# Patient Record
Sex: Male | Born: 1935
Health system: Southern US, Community
[De-identification: ages and names within clinical notes are randomized; demographics above are authoritative.]

## PROBLEM LIST (undated history)

## (undated) DIAGNOSIS — J42 Unspecified chronic bronchitis: Secondary | ICD-10-CM

## (undated) DIAGNOSIS — E785 Hyperlipidemia, unspecified: Secondary | ICD-10-CM

## (undated) DIAGNOSIS — C801 Malignant (primary) neoplasm, unspecified: Secondary | ICD-10-CM

## (undated) DIAGNOSIS — E538 Deficiency of other specified B group vitamins: Secondary | ICD-10-CM

## (undated) DIAGNOSIS — J3089 Other allergic rhinitis: Secondary | ICD-10-CM

## (undated) DIAGNOSIS — I2510 Atherosclerotic heart disease of native coronary artery without angina pectoris: Secondary | ICD-10-CM

## (undated) DIAGNOSIS — I209 Angina pectoris, unspecified: Secondary | ICD-10-CM

## (undated) DIAGNOSIS — I38 Endocarditis, valve unspecified: Secondary | ICD-10-CM

## (undated) DIAGNOSIS — H9190 Unspecified hearing loss, unspecified ear: Secondary | ICD-10-CM

## (undated) DIAGNOSIS — M1990 Unspecified osteoarthritis, unspecified site: Secondary | ICD-10-CM

## (undated) DIAGNOSIS — F419 Anxiety disorder, unspecified: Secondary | ICD-10-CM

## (undated) DIAGNOSIS — D649 Anemia, unspecified: Secondary | ICD-10-CM

## (undated) DIAGNOSIS — N138 Other obstructive and reflux uropathy: Secondary | ICD-10-CM

## (undated) DIAGNOSIS — H269 Unspecified cataract: Secondary | ICD-10-CM

## (undated) DIAGNOSIS — H3530 Unspecified macular degeneration: Secondary | ICD-10-CM

## (undated) DIAGNOSIS — I509 Heart failure, unspecified: Secondary | ICD-10-CM

## (undated) DIAGNOSIS — J449 Chronic obstructive pulmonary disease, unspecified: Secondary | ICD-10-CM

## (undated) DIAGNOSIS — R0609 Other forms of dyspnea: Secondary | ICD-10-CM

## (undated) DIAGNOSIS — I4510 Unspecified right bundle-branch block: Secondary | ICD-10-CM

## (undated) DIAGNOSIS — R339 Retention of urine, unspecified: Secondary | ICD-10-CM

## (undated) DIAGNOSIS — Z8616 Personal history of COVID-19: Secondary | ICD-10-CM

## (undated) DIAGNOSIS — I714 Abdominal aortic aneurysm, without rupture, unspecified: Secondary | ICD-10-CM

## (undated) DIAGNOSIS — I10 Essential (primary) hypertension: Secondary | ICD-10-CM

## (undated) DIAGNOSIS — R972 Elevated prostate specific antigen [PSA]: Secondary | ICD-10-CM

## (undated) DIAGNOSIS — N401 Enlarged prostate with lower urinary tract symptoms: Secondary | ICD-10-CM

## (undated) DIAGNOSIS — I6529 Occlusion and stenosis of unspecified carotid artery: Secondary | ICD-10-CM

## (undated) DIAGNOSIS — C61 Malignant neoplasm of prostate: Secondary | ICD-10-CM

## (undated) DIAGNOSIS — Z87442 Personal history of urinary calculi: Secondary | ICD-10-CM

## (undated) HISTORY — PX: EYE SURGERY: SHX253

## (undated) HISTORY — DX: Anxiety disorder, unspecified: F41.9

## (undated) HISTORY — DX: Other obstructive and reflux uropathy: N13.8

## (undated) HISTORY — DX: Heart failure, unspecified: I50.9

## (undated) HISTORY — DX: Elevated prostate specific antigen (PSA): R97.20

## (undated) HISTORY — DX: Essential (primary) hypertension: I10

## (undated) HISTORY — DX: Chronic obstructive pulmonary disease, unspecified: J44.9

## (undated) HISTORY — PX: HERNIA REPAIR: SHX51

## (undated) HISTORY — DX: Unspecified chronic bronchitis: J42

## (undated) HISTORY — PX: OTHER SURGICAL HISTORY: SHX169

## (undated) HISTORY — DX: Retention of urine, unspecified: R33.9

## (undated) HISTORY — DX: Unspecified macular degeneration: H35.30

## (undated) HISTORY — DX: Hyperlipidemia, unspecified: E78.5

## (undated) HISTORY — PX: ABDOMINAL AORTIC ANEURYSM REPAIR: SUR1152

## (undated) HISTORY — DX: Benign prostatic hyperplasia with lower urinary tract symptoms: N40.1

---

## 2004-09-10 ENCOUNTER — Observation Stay: Payer: Self-pay | Admitting: Internal Medicine

## 2004-09-11 ENCOUNTER — Other Ambulatory Visit: Payer: Self-pay

## 2005-03-08 ENCOUNTER — Inpatient Hospital Stay: Payer: Self-pay | Admitting: Surgery

## 2005-12-25 ENCOUNTER — Ambulatory Visit: Payer: Self-pay | Admitting: Gastroenterology

## 2007-02-16 ENCOUNTER — Emergency Department: Payer: Self-pay | Admitting: Emergency Medicine

## 2007-02-16 ENCOUNTER — Emergency Department: Payer: Self-pay | Admitting: Internal Medicine

## 2009-05-21 ENCOUNTER — Emergency Department: Payer: Self-pay | Admitting: Emergency Medicine

## 2009-08-16 ENCOUNTER — Ambulatory Visit: Payer: Self-pay | Admitting: Vascular Surgery

## 2009-09-06 ENCOUNTER — Ambulatory Visit: Payer: Self-pay | Admitting: Vascular Surgery

## 2009-09-15 ENCOUNTER — Inpatient Hospital Stay: Payer: Self-pay | Admitting: Vascular Surgery

## 2010-06-18 HISTORY — PX: PROSTATE SURGERY: SHX751

## 2011-12-18 ENCOUNTER — Ambulatory Visit: Payer: Self-pay | Admitting: Urology

## 2011-12-18 LAB — CBC WITH DIFFERENTIAL/PLATELET
Basophil #: 0.1 10*3/uL (ref 0.0–0.1)
Basophil %: 1.8 %
Eosinophil #: 0.1 10*3/uL (ref 0.0–0.7)
Eosinophil %: 1 %
HCT: 41 % (ref 40.0–52.0)
HGB: 13.1 g/dL (ref 13.0–18.0)
Lymphocyte #: 1.2 10*3/uL (ref 1.0–3.6)
Lymphocyte %: 16.3 %
MCH: 29.7 pg (ref 26.0–34.0)
MCHC: 31.9 g/dL — ABNORMAL LOW (ref 32.0–36.0)
MCV: 93 fL (ref 80–100)
Monocyte #: 0.6 x10 3/mm (ref 0.2–1.0)
Monocyte %: 7.8 %
Neutrophil #: 5.3 10*3/uL (ref 1.4–6.5)
Neutrophil %: 73.1 %
Platelet: 202 10*3/uL (ref 150–440)
RBC: 4.42 10*6/uL (ref 4.40–5.90)
RDW: 14 % (ref 11.5–14.5)
WBC: 7.3 10*3/uL (ref 3.8–10.6)

## 2011-12-18 LAB — BASIC METABOLIC PANEL
Anion Gap: 4 — ABNORMAL LOW (ref 7–16)
BUN: 16 mg/dL (ref 7–18)
Calcium, Total: 8.6 mg/dL (ref 8.5–10.1)
Chloride: 104 mmol/L (ref 98–107)
Co2: 30 mmol/L (ref 21–32)
Creatinine: 1.12 mg/dL (ref 0.60–1.30)
EGFR (African American): >60
EGFR (Non-African Amer.): >60
Glucose: 88 mg/dL (ref 65–99)
Osmolality: 276 (ref 275–301)
Potassium: 4.3 mmol/L (ref 3.5–5.1)
Sodium: 138 mmol/L (ref 136–145)

## 2011-12-20 LAB — URINE CULTURE

## 2011-12-26 ENCOUNTER — Ambulatory Visit: Payer: Self-pay | Admitting: Urology

## 2012-10-27 DIAGNOSIS — N138 Other obstructive and reflux uropathy: Secondary | ICD-10-CM | POA: Insufficient documentation

## 2014-04-20 DIAGNOSIS — F411 Generalized anxiety disorder: Secondary | ICD-10-CM | POA: Insufficient documentation

## 2014-05-20 DIAGNOSIS — I714 Abdominal aortic aneurysm, without rupture, unspecified: Secondary | ICD-10-CM | POA: Insufficient documentation

## 2014-05-20 DIAGNOSIS — R0681 Apnea, not elsewhere classified: Secondary | ICD-10-CM | POA: Insufficient documentation

## 2014-05-20 DIAGNOSIS — I6529 Occlusion and stenosis of unspecified carotid artery: Secondary | ICD-10-CM | POA: Insufficient documentation

## 2014-08-08 ENCOUNTER — Emergency Department: Payer: Self-pay | Admitting: Family Medicine

## 2014-08-24 DIAGNOSIS — F419 Anxiety disorder, unspecified: Secondary | ICD-10-CM | POA: Insufficient documentation

## 2014-10-10 NOTE — Op Note (Signed)
PATIENT NAME:  [name], Ruben MR#:  [id number] DATE OF BIRTH:  [birth year]  DATE OF PROCEDURE:  12/26/2011  PREOPERATIVE DIAGNOSES:  1. Benign prostatic hypertrophy with bladder outlet obstruction. 2. Incomplete bladder emptying.   POSTOPERATIVE DIAGNOSES:    1. Benign prostatic hypertrophy with bladder outlet obstruction. 2. Incomplete bladder emptying.   PROCEDURE:  Photoselective vaporization of the prostate.   SURGEON:  Scott C. Stoioff, MD.   ASSISTANT: None.   ANESTHESIA:  General.   INDICATIONS: 81 year old male with a long history of benign prostatic hypertrophy and incomplete bladder emptying. He had been on both tamsulosin and finasteride for several years. Voiding pattern is moderate. Residual was noted to be slightly increased, approximately 500 milliliters. After a discussion of the treatment options, he has elected to proceed with outlet surgery.   DESCRIPTION OF PROCEDURE: The patient was taken to the Operating Room where a general anesthetic was administered. He was placed in the low lithotomy position and his external genitalia were prepped and draped sterilely. A 22 French continuous flow laser cystoscope was lubricated and passed under direct vision. The urethra was normal in caliber without stricture. Prostate showed touching the lateral lobes with no significant median lobe tissue present. Prostatic urethral length was approximately 4 cm. A KTP laser fiber was placed through the cystoscope. At a power of 90 watts, the slightly elevated bladder neck was vaporized. Attention was directed to the left lateral lobe which was vaporized from the bladder neck moving toward the verumontanum. Hemostasis was obtained with the coagulation setting. Power was subsequently increased to 180 watts. The right lateral lobe was vaporized in a similar fashion. There was some adenoma protruding  distal to the verumontanum which was not vaporized. The channel was opened at the completion of  the procedure. Total energy used was 267,159 joules. Laser time was 31 minutes and 57 seconds. Cystoscope was removed. A 20 French Foley catheter was place with return of light rosea effluent upon irrigation. A B and O suppository was placed per rectum. The patient was taken to PACU in stable condition. There were no complications. Estimated blood loss was minimal.    ____________________________ Ronda Fairly. Bernardo Heater, MD scs:ap D: 12/26/2011 21:10:10 ET T: 12/27/2011 09:39:27 ET JOB#: [id number]  cc: Nicki Reaper C. Bernardo Heater, MD, <Dictator> Abbie Sons MD ELECTRONICALLY SIGNED 12/31/2011 9:42

## 2015-01-28 ENCOUNTER — Other Ambulatory Visit: Payer: Self-pay | Admitting: Urology

## 2015-01-31 ENCOUNTER — Encounter: Payer: Self-pay | Admitting: *Deleted

## 2015-02-03 ENCOUNTER — Other Ambulatory Visit: Payer: Commercial Managed Care - HMO

## 2015-02-03 DIAGNOSIS — R972 Elevated prostate specific antigen [PSA]: Secondary | ICD-10-CM

## 2015-02-04 LAB — PSA: Prostate Specific Ag, Serum: 3.5 ng/mL (ref 0.0–4.0)

## 2015-02-08 ENCOUNTER — Ambulatory Visit (INDEPENDENT_AMBULATORY_CARE_PROVIDER_SITE_OTHER): Payer: Commercial Managed Care - HMO | Admitting: Urology

## 2015-02-08 ENCOUNTER — Encounter: Payer: Self-pay | Admitting: Urology

## 2015-02-08 VITALS — BP 131/72 | HR 47 | Ht 67.0 in | Wt 164.2 lb

## 2015-02-08 DIAGNOSIS — N528 Other male erectile dysfunction: Secondary | ICD-10-CM | POA: Diagnosis not present

## 2015-02-08 DIAGNOSIS — R972 Elevated prostate specific antigen [PSA]: Secondary | ICD-10-CM

## 2015-02-08 DIAGNOSIS — N529 Male erectile dysfunction, unspecified: Secondary | ICD-10-CM

## 2015-02-08 DIAGNOSIS — N401 Enlarged prostate with lower urinary tract symptoms: Secondary | ICD-10-CM

## 2015-02-08 DIAGNOSIS — N138 Other obstructive and reflux uropathy: Secondary | ICD-10-CM | POA: Insufficient documentation

## 2015-02-08 LAB — BLADDER SCAN AMB NON-IMAGING: Scan Result: 125

## 2015-02-08 MED ORDER — FINASTERIDE 5 MG PO TABS: 5.0000 mg | ORAL_TABLET | Freq: Every day | ORAL | Status: DC

## 2015-02-08 MED ORDER — TAMSULOSIN HCL 0.4 MG PO CAPS: 0.4000 mg | ORAL_CAPSULE | Freq: Every day | ORAL | Status: DC

## 2015-02-08 NOTE — Progress Notes (Signed)
02/08/2015 1:55 PM   Ruben Green April 02, 1934 [id number]  Referring provider: Tracie Harrier, MD [place], [place] [address]  Chief Complaint  Patient presents with  . Elevated PSA    6 month recheck    HPI: Ruben Green is a 81 year old [place] male with a history of elevated PSA and BPH with LUTS who presents today for 6 month recheck.   Elevated PSA Patient's PSA has ranged from 5-8. He is currently on finasteride and has been so for many years. He has no family history of prostate cancer. His DRE's have been benign for the exception of enlargement.  BPH with LUTS: His IPSS score today is 7, which is mild lower urinary tract symptomatology. He is mostly satisfied with his quality life due to his urinary symptoms. His PVR is 125  mL.  His previous IPSS score was 6/2.  His previous PVR is 13 mL.    His major complaint today erectile dysfunction.  He has had these symptoms for many years.  He denies any dysuria, hematuria or suprapubic pain.   He currently taking tamsulosin and finasteride.  His has had photo vaporization of the prostate on 12/26/2011 with Dr. Bernardo Heater.   He also denies any recent fevers, chills, nausea or vomiting.    He does not have a family history of PCa.      IPSS      02/08/15 1300       International Prostate Symptom Score   How often have you had the sensation of not emptying your bladder? Not at All     How often have you had to urinate less than every two hours? Not at All     How often have you found you stopped and started again several times when you urinated? Less than 1 in 5 times     How often have you found it difficult to postpone urination? Not at All     How often have you had a weak urinary stream? Not at All     How often have you had to strain to start urination? About half the time     How many times did you typically get up at night to urinate? 3 Times     Total IPSS Score 7     Quality of Life due  to urinary symptoms   If you were to spend the rest of your life with your urinary condition just the way it is now how would you feel about that? Mostly Satisfied        Score:  1-7 Mild 8-19 Moderate 20-35 Severe      PMH: Past Medical History  Diagnosis Date  . COPD (chronic obstructive pulmonary disease)   . HLD (hyperlipidemia)   . Anxiety   . Chronic bronchitis   . HTN (hypertension)   . BPH with obstruction/lower urinary tract symptoms   . Elevated PSA   . Incomplete bladder emptying     Surgical History: Past Surgical History  Procedure Laterality Date  . Prostate surgery  2012  . Right carotid endarterectomy    . Hernia repair      x 2  . Abdominal aortic aneurysm repair      Home Medications:    Medication List       This list is accurate as of: 02/08/15  1:55 PM.  Always use your most recent med list.  albuterol-ipratropium 18-103 MCG/ACT inhaler  Commonly known as:  COMBIVENT  Inhale 1-2 puffs into the lungs every 4 (four) hours.     amLODipine 10 MG tablet  Commonly known as:  NORVASC  Take 10 mg by mouth daily.     aspirin 81 MG tablet  Take 81 mg by mouth daily.     atorvastatin 40 MG tablet  Commonly known as:  LIPITOR  Take 40 mg by mouth daily.     finasteride 5 MG tablet  Commonly known as:  PROSCAR  TAKE ONE (1) TABLET EACH DAY     fluticasone 50 MCG/ACT nasal spray  Commonly known as:  FLONASE  Place into the nose.     furosemide 20 MG tablet  Commonly known as:  LASIX  Take by mouth.     lisinopril 40 MG tablet  Commonly known as:  PRINIVIL,ZESTRIL  Take 40 mg by mouth daily.     LORazepam 0.5 MG tablet  Commonly known as:  ATIVAN  Take 0.5 mg by mouth every 8 (eight) hours.     losartan 100 MG tablet  Commonly known as:  COZAAR  Take by mouth.     SYMBICORT 160-4.5 MCG/ACT inhaler  Generic drug:  budesonide-formoterol  Inhale into the lungs.     tamsulosin 0.4 MG Caps capsule  Commonly known  as:  FLOMAX  Take 0.4 mg by mouth daily.        Allergies: No Known Allergies  Family History: Family History  Problem Relation Age of Onset  . Prostate cancer Neg Hx   . Bladder Cancer Neg Hx     Social History:  reports that he has quit smoking. He does not have any smokeless tobacco history on file. He reports that he does not drink alcohol or use illicit drugs.  ROS: UROLOGY Frequent Urination?: No Hard to postpone urination?: No Burning/pain with urination?: No Get up at night to urinate?: Yes Leakage of urine?: No Urine stream starts and stops?: No Trouble starting stream?: No Do you have to strain to urinate?: Yes Blood in urine?: No Urinary tract infection?: No Sexually transmitted disease?: No Injury to kidneys or bladder?: No Painful intercourse?: No Weak stream?: No Erection problems?: No Penile pain?: No  Gastrointestinal Nausea?: No Vomiting?: No Indigestion/heartburn?: No Diarrhea?: No Constipation?: No  Constitutional Fever: No Night sweats?: No Weight loss?: No Fatigue?: No  Skin Skin rash/lesions?: No Itching?: No  Eyes Blurred vision?: No Double vision?: No  Ears/Nose/Throat Sore throat?: No Sinus problems?: No  Hematologic/Lymphatic Swollen glands?: No Easy bruising?: No  Cardiovascular Leg swelling?: No Chest pain?: No  Respiratory Cough?: No Shortness of breath?: No  Endocrine Excessive thirst?: No  Musculoskeletal Back pain?: No Joint pain?: No  Neurological Headaches?: No Dizziness?: No  Psychologic Depression?: No Anxiety?: No  Physical Exam: BP 131/72 mmHg  Pulse 47  Ht 5\' 7"  (1.702 m)  Wt 164 lb 3.2 oz (74.481 kg)  BMI 25.71 kg/m2  GU: Patient with uncircumcised phallus.  Urethral meatus is patent.  No penile discharge. No penile lesions or rashes. Scrotum without lesions, cysts, rashes and/or edema.  Testicles are located scrotally bilaterally. No masses are appreciated in the testicles. Left and  right epididymis are normal. Rectal: Patient with  normal sphincter tone. Perineum without scarring or rashes. No rectal masses are appreciated. Prostate is approximately 55 grams, no nodules are appreciated. Seminal vesicles are normal.   Laboratory Data: Lab Results  Component Value Date   WBC 7.3 12/18/2011  HGB 13.1 12/18/2011   HCT 41.0 12/18/2011   MCV 93 12/18/2011   PLT 202 12/18/2011    Lab Results  Component Value Date   CREATININE 1.12 12/18/2011    Lab Results  Component Value Date   PSA 3.5 02/03/2015   Previous PSA's:     5.67 ng/mL on 09/15/2013     6.9 ng/mL on 05/11/2014     7.3 ng/mL on 08/10/2014  Pertinent Imaging: Results for orders placed or performed in visit on 02/08/15  BLADDER SCAN AMB NON-IMAGING  Result Value Ref Range   Scan Result 125     Assessment & Plan:    1. History of elevated PSA:   Patient has undergone PSA screening for many years. It has been elevated on occasion. At this time, he would like to continue screening for we will increase the threshold for prostate biopsy to 10 ng/mL and when his PSA value reaches below 3 ng/mL we will discontinue PSA screening. Patient is agreeable to this plan.  2. BPH with LUTS:   Patient's IPSS score is 7/2.  His PVR 125 mL.  His DRE demonstrates enlargement, no nodules.  He will continue the tamsulosin and finasteride. His PVR is increasing but he is not bothered with his lower urinary tract symptoms at this time.   He will follow up in 6 months for a PSA, DRE, PVR and an IPSS.    - BLADDER SCAN AMB NON-IMAGING  3. Erectile dysfunction:   The end of the visit, patient stated that he has been experiencing erectile dysfunction for a long time. He was wondering if medications like Viagra or Cialis were safe.  I discussed the mechanism of action for these medications and the risk factors for the patient.   Gave him samples of Cialis 20 mg (#2).  We will reevaluate upon his return in 6 months with a SHIM  score.  Nursing note for RTC:      - IPSS score      - PVR      -SHIM score      -PSA (should have been drawn prior to appointment)       -EXAM      No Follow-up on file.  Zara Council, [facility] [address] [place], [place] [address] [phone]

## 2015-04-05 DIAGNOSIS — E538 Deficiency of other specified B group vitamins: Secondary | ICD-10-CM | POA: Insufficient documentation

## 2015-08-01 ENCOUNTER — Other Ambulatory Visit: Payer: Self-pay

## 2015-08-01 DIAGNOSIS — R972 Elevated prostate specific antigen [PSA]: Secondary | ICD-10-CM

## 2015-08-02 ENCOUNTER — Other Ambulatory Visit: Payer: PPO

## 2015-08-02 DIAGNOSIS — R972 Elevated prostate specific antigen [PSA]: Secondary | ICD-10-CM

## 2015-08-03 LAB — PSA: Prostate Specific Ag, Serum: 6.2 ng/mL — ABNORMAL HIGH (ref 0.0–4.0)

## 2015-08-05 DIAGNOSIS — I10 Essential (primary) hypertension: Secondary | ICD-10-CM | POA: Diagnosis not present

## 2015-08-05 DIAGNOSIS — I6529 Occlusion and stenosis of unspecified carotid artery: Secondary | ICD-10-CM | POA: Diagnosis not present

## 2015-08-05 DIAGNOSIS — I714 Abdominal aortic aneurysm, without rupture: Secondary | ICD-10-CM | POA: Diagnosis not present

## 2015-08-05 DIAGNOSIS — E785 Hyperlipidemia, unspecified: Secondary | ICD-10-CM | POA: Diagnosis not present

## 2015-08-05 DIAGNOSIS — I6523 Occlusion and stenosis of bilateral carotid arteries: Secondary | ICD-10-CM | POA: Diagnosis not present

## 2015-08-09 ENCOUNTER — Encounter: Payer: Self-pay | Admitting: Urology

## 2015-08-09 ENCOUNTER — Ambulatory Visit (INDEPENDENT_AMBULATORY_CARE_PROVIDER_SITE_OTHER): Payer: PPO | Admitting: Urology

## 2015-08-09 VITALS — BP 134/78 | HR 83 | Ht 62.0 in | Wt 171.2 lb

## 2015-08-09 DIAGNOSIS — N401 Enlarged prostate with lower urinary tract symptoms: Secondary | ICD-10-CM

## 2015-08-09 DIAGNOSIS — N529 Male erectile dysfunction, unspecified: Secondary | ICD-10-CM

## 2015-08-09 DIAGNOSIS — R972 Elevated prostate specific antigen [PSA]: Secondary | ICD-10-CM | POA: Diagnosis not present

## 2015-08-09 DIAGNOSIS — N528 Other male erectile dysfunction: Secondary | ICD-10-CM | POA: Diagnosis not present

## 2015-08-09 DIAGNOSIS — N138 Other obstructive and reflux uropathy: Secondary | ICD-10-CM

## 2015-08-09 LAB — BLADDER SCAN AMB NON-IMAGING: Scan Result: 250

## 2015-08-09 MED ORDER — FINASTERIDE 5 MG PO TABS: 5.0000 mg | ORAL_TABLET | Freq: Every day | ORAL | Status: DC

## 2015-08-09 NOTE — Progress Notes (Signed)
2:58 PM   Ruben Green August 29, 1933 [id number]  Referring provider: No referring provider defined for this encounter.  Chief Complaint  Patient presents with  . Benign Prostatic Hypertrophy    6 month follow up  . Elevated PSA    HPI: Mr. Handel is a 82 year old [place] male with a history of elevated PSA and BPH with LUTS who presents today for 6 month recheck.   Elevated PSA Patient's PSA has ranged from 5-8.  He has no family history of prostate cancer. His DRE's have been benign for the exception of enlargement.  He stopped his finasteride and his PSA rose from 3.5 to 6.2.    BPH with LUTS: His IPSS score today is 7, which is mild lower urinary tract symptomatology. He is mostly satisfied with his quality life due to his urinary symptoms. His PVR is 250 mL.  His previous IPSS score was 7/2.  His previous PVR is 125 mL.  His major complaint today erectile dysfunction.  He has had these symptoms for many years.  He denies any dysuria, hematuria or suprapubic pain.  He currently taking tamsulosin and restart the finasteride.  His has had photo vaporization of the prostate on 12/26/2011 with Dr. Bernardo Heater.  He also denies any recent fevers, chills, nausea or vomiting.  He does not have a family history of PCa.      IPSS      08/09/15 1300       International Prostate Symptom Score   How often have you had the sensation of not emptying your bladder? Not at All     How often have you had to urinate less than every two hours? Not at All     How often have you found you stopped and started again several times when you urinated? Not at All     How often have you found it difficult to postpone urination? Not at All     How often have you had a weak urinary stream? Not at All     How often have you had to strain to start urination? Not at All     How many times did you typically get up at night to urinate? 2 Times     Total IPSS Score 2     Quality of Life due to urinary  symptoms   If you were to spend the rest of your life with your urinary condition just the way it is now how would you feel about that? Mostly Satisfied        Score:  1-7 Mild 8-19 Moderate 20-35 Severe      PMH: Past Medical History  Diagnosis Date  . COPD (chronic obstructive pulmonary disease) ([place])   . HLD (hyperlipidemia)   . Anxiety   . Chronic bronchitis ([place])   . HTN (hypertension)   . BPH with obstruction/lower urinary tract symptoms   . Elevated PSA   . Incomplete bladder emptying     Surgical History: Past Surgical History  Procedure Laterality Date  . Prostate surgery  2012  . Right carotid endarterectomy    . Hernia repair      x 2  . Abdominal aortic aneurysm repair      Home Medications:    Medication List       This list is accurate as of: 08/09/15 11:59 PM.  Always use your most recent med list.  albuterol-ipratropium 18-103 MCG/ACT inhaler  Commonly known as:  COMBIVENT  Inhale 1-2 puffs into the lungs every 4 (four) hours.     amLODipine 10 MG tablet  Commonly known as:  NORVASC  Take 10 mg by mouth daily.     aspirin 81 MG tablet  Take 81 mg by mouth daily.     atorvastatin 40 MG tablet  Commonly known as:  LIPITOR  Take 40 mg by mouth daily.     finasteride 5 MG tablet  Commonly known as:  PROSCAR  Take 1 tablet (5 mg total) by mouth daily.     finasteride 5 MG tablet  Commonly known as:  PROSCAR  Take 1 tablet (5 mg total) by mouth daily.     fluticasone 50 MCG/ACT nasal spray  Commonly known as:  FLONASE  Place into the nose.     furosemide 20 MG tablet  Commonly known as:  LASIX  Take by mouth.     lisinopril 40 MG tablet  Commonly known as:  PRINIVIL,ZESTRIL  Take 40 mg by mouth daily.     LORazepam 0.5 MG tablet  Commonly known as:  ATIVAN  Take 0.5 mg by mouth every 8 (eight) hours.     losartan 100 MG tablet  Commonly known as:  COZAAR  Take by mouth.     SYMBICORT 160-4.5 MCG/ACT  inhaler  Generic drug:  budesonide-formoterol  Inhale into the lungs.     tamsulosin 0.4 MG Caps capsule  Commonly known as:  FLOMAX  Take 1 capsule (0.4 mg total) by mouth daily.        Allergies: No Known Allergies  Family History: Family History  Problem Relation Age of Onset  . Prostate cancer Neg Hx   . Bladder Cancer Neg Hx     Social History:  reports that he has quit smoking. He does not have any smokeless tobacco history on file. He reports that he does not drink alcohol or use illicit drugs.  ROS:  UROLOGY Frequent Urination?: No Hard to postpone urination?: No Burning/pain with urination?: No Get up at night to urinate?: No Leakage of urine?: No Urine stream starts and stops?: No Trouble starting stream?: No Do you have to strain to urinate?: No Blood in urine?: No Urinary tract infection?: No Sexually transmitted disease?: No Injury to kidneys or bladder?: No Painful intercourse?: No Weak stream?: No Erection problems?: No Penile pain?: No  Gastrointestinal Nausea?: No Vomiting?: No Indigestion/heartburn?: No Diarrhea?: No Constipation?: No  Constitutional Fever: No Night sweats?: No Weight loss?: No Fatigue?: No  Skin Skin rash/lesions?: No Itching?: No  Eyes Blurred vision?: No Double vision?: No  Ears/Nose/Throat Sore throat?: No Sinus problems?: No  Hematologic/Lymphatic Swollen glands?: No Easy bruising?: No  Cardiovascular Leg swelling?: No Chest pain?: No  Respiratory Cough?: No Shortness of breath?: No  Endocrine Excessive thirst?: No  Musculoskeletal Back pain?: No Joint pain?: No  Neurological Headaches?: No Dizziness?: No  Psychologic Depression?: No Anxiety?: No  Physical Exam: BP 134/78 mmHg  Pulse 83  Ht 5\' 2"  (1.575 m)  Wt 171 lb 3.2 oz (77.656 kg)  BMI 31.31 kg/m2  GU: Patient with uncircumcised phallus.  Urethral meatus is patent.  No penile discharge. No penile lesions or rashes. Scrotum  without lesions, cysts, rashes and/or edema.  Testicles are located scrotally bilaterally. No masses are appreciated in the testicles. Left and right epididymis are normal. Rectal: Patient with  normal sphincter tone. Perineum without scarring or rashes. No rectal  masses are appreciated. Prostate is approximately 55 grams, no nodules are appreciated. Seminal vesicles are normal.   Laboratory Data: Lab Results  Component Value Date   WBC 7.3 12/18/2011   HGB 13.1 12/18/2011   HCT 41.0 12/18/2011   MCV 93 12/18/2011   PLT 202 12/18/2011   Lab Results  Component Value Date   CREATININE 1.12 12/18/2011   Previous PSA's:     5.67 ng/mL on 09/15/2013     6.9 ng/mL on 05/11/2014     7.3 ng/mL on 08/10/2014     3.5 ng/mL on 02/03/2015   Pertinent Imaging: Results for orders placed or performed in visit on 08/09/15  BLADDER SCAN AMB NON-IMAGING  Result Value Ref Range   Scan Result 250     Assessment & Plan:    1. History of elevated PSA:   Patient has undergone PSA screening for many years. It has been elevated on occasion. At this time, he would like to continue screening so we will increase the threshold for prostate biopsy to 10 ng/mL.  Patient is agreeable to this plan.  2. BPH with LUTS:   Patient's IPSS score is 2/2.  His PVR 250 mL.  His DRE demonstrates enlargement, no nodules.  He will continue the tamsulosin and restart the finasteride. His PVR is increasing but he is not bothered with his lower urinary tract symptoms at this time.   He will follow up in 3 months for a PSA and PVR.    - BLADDER SCAN AMB NON-IMAGING  3. Erectile dysfunction:   The end of the visit, patient stated that he has been experiencing erectile dysfunction for a long time. He was wondering if medications like Viagra or Cialis were safe.  I discussed the mechanism of action for these medications and the risk factors for the patient.   Gave him samples of Cialis 20 mg (#2).  We will reevaluate upon his  return in 3 months with a SHIM score.  Return in about 3 months (around 11/06/2015) for PSA , SHIM score and PVR.  Zara Council, [facility] [address], [place], [place] [address] [phone]

## 2015-08-23 DIAGNOSIS — J42 Unspecified chronic bronchitis: Secondary | ICD-10-CM | POA: Diagnosis not present

## 2015-08-23 DIAGNOSIS — F419 Anxiety disorder, unspecified: Secondary | ICD-10-CM | POA: Diagnosis not present

## 2015-08-23 DIAGNOSIS — I10 Essential (primary) hypertension: Secondary | ICD-10-CM | POA: Diagnosis not present

## 2015-08-23 DIAGNOSIS — E782 Mixed hyperlipidemia: Secondary | ICD-10-CM | POA: Diagnosis not present

## 2015-08-23 DIAGNOSIS — Z125 Encounter for screening for malignant neoplasm of prostate: Secondary | ICD-10-CM | POA: Diagnosis not present

## 2015-08-23 DIAGNOSIS — E538 Deficiency of other specified B group vitamins: Secondary | ICD-10-CM | POA: Diagnosis not present

## 2015-08-30 DIAGNOSIS — E538 Deficiency of other specified B group vitamins: Secondary | ICD-10-CM | POA: Diagnosis not present

## 2015-08-30 DIAGNOSIS — R739 Hyperglycemia, unspecified: Secondary | ICD-10-CM | POA: Diagnosis not present

## 2015-08-30 DIAGNOSIS — J42 Unspecified chronic bronchitis: Secondary | ICD-10-CM | POA: Diagnosis not present

## 2015-08-30 DIAGNOSIS — F411 Generalized anxiety disorder: Secondary | ICD-10-CM | POA: Diagnosis not present

## 2015-08-30 DIAGNOSIS — I10 Essential (primary) hypertension: Secondary | ICD-10-CM | POA: Diagnosis not present

## 2015-08-30 DIAGNOSIS — R1031 Right lower quadrant pain: Secondary | ICD-10-CM | POA: Diagnosis not present

## 2015-08-30 DIAGNOSIS — F419 Anxiety disorder, unspecified: Secondary | ICD-10-CM | POA: Diagnosis not present

## 2015-08-30 DIAGNOSIS — Z0000 Encounter for general adult medical examination without abnormal findings: Secondary | ICD-10-CM | POA: Diagnosis not present

## 2015-11-08 ENCOUNTER — Ambulatory Visit (INDEPENDENT_AMBULATORY_CARE_PROVIDER_SITE_OTHER): Payer: PPO | Admitting: Urology

## 2015-11-08 ENCOUNTER — Encounter: Payer: Self-pay | Admitting: Urology

## 2015-11-08 VITALS — BP 155/77 | HR 91 | Ht 62.0 in | Wt 165.0 lb

## 2015-11-08 DIAGNOSIS — J449 Chronic obstructive pulmonary disease, unspecified: Secondary | ICD-10-CM | POA: Insufficient documentation

## 2015-11-08 DIAGNOSIS — N400 Enlarged prostate without lower urinary tract symptoms: Secondary | ICD-10-CM | POA: Insufficient documentation

## 2015-11-08 DIAGNOSIS — J441 Chronic obstructive pulmonary disease with (acute) exacerbation: Secondary | ICD-10-CM | POA: Insufficient documentation

## 2015-11-08 DIAGNOSIS — N401 Enlarged prostate with lower urinary tract symptoms: Secondary | ICD-10-CM | POA: Diagnosis not present

## 2015-11-08 DIAGNOSIS — I10 Essential (primary) hypertension: Secondary | ICD-10-CM | POA: Insufficient documentation

## 2015-11-08 DIAGNOSIS — N138 Other obstructive and reflux uropathy: Secondary | ICD-10-CM

## 2015-11-08 DIAGNOSIS — R972 Elevated prostate specific antigen [PSA]: Secondary | ICD-10-CM | POA: Diagnosis not present

## 2015-11-08 DIAGNOSIS — E782 Mixed hyperlipidemia: Secondary | ICD-10-CM | POA: Insufficient documentation

## 2015-11-08 LAB — BLADDER SCAN AMB NON-IMAGING

## 2015-11-08 NOTE — Progress Notes (Signed)
3:26 PM   Ruben Green Feb 17, 1934 BP:[id number]  Referring provider: No referring provider defined for this encounter.  Chief Complaint  Patient presents with  . Benign Prostatic Hypertrophy    40month    HPI: Mr. Zerger is a 82 year old [place] male with a history of elevated PSA, erectile dysfunction and BPH with LUTS who presents today for 6 month recheck.   Elevated PSA Patient's PSA has ranged from 5-8.  He has no family history of prostate cancer. His DRE's have been benign for the exception of enlargement.  He stopped his finasteride and his PSA rose from 3.5 to 6.2.  His most recent PSA was found to be 4.4 ng/mL on 11/08/2015.  Erectile dysfunction Patient refused to fill out a SH IM score sheet today.  He states that his wife is not interested in sexual activity and he is too old to concern himself with this issue.  BPH with LUTS: His IPSS score today is 6, which is mild lower urinary tract symptomatology. He is mostly satisfied with his quality life due to his urinary symptoms. His PVR is 131 mL.  His previous IPSS score was 7/2.  His previous PVR is 250 mL.   He denies any dysuria, hematuria or suprapubic pain.  He currently taking tamsulosin and restart the finasteride.  His has had photo vaporization of the prostate on 12/26/2011 with Dr. Bernardo Heater.  He also denies any recent fevers, chills, nausea or vomiting.  He does not have a family history of PCa.      IPSS      11/08/15 1400       International Prostate Symptom Score   How often have you had the sensation of not emptying your bladder? Not at All     How often have you had to urinate less than every two hours? Not at All     How often have you found you stopped and started again several times when you urinated? Not at All     How often have you found it difficult to postpone urination? Not at All     How often have you had a weak urinary stream? Not at All     How often have you had to strain to start  urination? Almost always     How many times did you typically get up at night to urinate? 1 Time     Total IPSS Score 6     Quality of Life due to urinary symptoms   If you were to spend the rest of your life with your urinary condition just the way it is now how would you feel about that? Mostly Satisfied        Score:  1-7 Mild 8-19 Moderate 20-35 Severe      PMH: Past Medical History  Diagnosis Date  . COPD (chronic obstructive pulmonary disease) ([place])   . HLD (hyperlipidemia)   . Anxiety   . Chronic bronchitis ([place])   . HTN (hypertension)   . BPH with obstruction/lower urinary tract symptoms   . Elevated PSA   . Incomplete bladder emptying     Surgical History: Past Surgical History  Procedure Laterality Date  . Prostate surgery  2012  . Right carotid endarterectomy    . Hernia repair      x 2  . Abdominal aortic aneurysm repair      Home Medications:    Medication List       This list  is accurate as of: 11/08/15  3:26 PM.  Always use your most recent med list.               albuterol-ipratropium 18-103 MCG/ACT inhaler  Commonly known as:  COMBIVENT  Inhale 1-2 puffs into the lungs every 4 (four) hours.     amLODipine 10 MG tablet  Commonly known as:  NORVASC  Take 10 mg by mouth daily.     aspirin 81 MG tablet  Take 81 mg by mouth daily.     atorvastatin 40 MG tablet  Commonly known as:  LIPITOR  Take 40 mg by mouth daily.     finasteride 5 MG tablet  Commonly known as:  PROSCAR  Take 1 tablet (5 mg total) by mouth daily.     fluticasone 50 MCG/ACT nasal spray  Commonly known as:  FLONASE  Place into the nose.     furosemide 20 MG tablet  Commonly known as:  LASIX  Take by mouth.     lisinopril 40 MG tablet  Commonly known as:  PRINIVIL,ZESTRIL  Take 40 mg by mouth daily.     LORazepam 0.5 MG tablet  Commonly known as:  ATIVAN  Take 0.5 mg by mouth every 8 (eight) hours.     losartan 100 MG tablet  Commonly known as:  COZAAR    Take by mouth.     SYMBICORT 160-4.5 MCG/ACT inhaler  Generic drug:  budesonide-formoterol  Inhale into the lungs.     tamsulosin 0.4 MG Caps capsule  Commonly known as:  FLOMAX  Take 1 capsule (0.4 mg total) by mouth daily.        Allergies: No Known Allergies  Family History: Family History  Problem Relation Age of Onset  . Prostate cancer Neg Hx   . Bladder Cancer Neg Hx     Social History:  reports that he has quit smoking. He does not have any smokeless tobacco history on file. He reports that he does not drink alcohol or use illicit drugs.  ROS:  UROLOGY Frequent Urination?: No Hard to postpone urination?: No Burning/pain with urination?: No Get up at night to urinate?: No Leakage of urine?: No Urine stream starts and stops?: No Trouble starting stream?: No Do you have to strain to urinate?: No Blood in urine?: No Urinary tract infection?: No Sexually transmitted disease?: No Injury to kidneys or bladder?: No Painful intercourse?: No Weak stream?: No Erection problems?: No Penile pain?: No  Gastrointestinal Nausea?: No Vomiting?: No Indigestion/heartburn?: No Diarrhea?: No Constipation?: No  Constitutional Fever: No Night sweats?: No Weight loss?: No Fatigue?: No  Skin Skin rash/lesions?: No Itching?: No  Eyes Blurred vision?: No Double vision?: No  Ears/Nose/Throat Sore throat?: No Sinus problems?: No  Hematologic/Lymphatic Swollen glands?: No Easy bruising?: No  Cardiovascular Leg swelling?: No Chest pain?: No  Respiratory Cough?: No Shortness of breath?: No  Endocrine Excessive thirst?: No  Musculoskeletal Back pain?: No Joint pain?: No  Neurological Headaches?: No Dizziness?: No  Psychologic Depression?: No Anxiety?: No  Physical Exam: BP 155/77 mmHg  Pulse 91  Ht 5\' 2"  (1.575 m)  Wt 165 lb (74.844 kg)  BMI 30.17 kg/m2  GU: Patient with uncircumcised phallus.  Urethral meatus is patent.  No penile  discharge. No penile lesions or rashes. Scrotum without lesions, cysts, rashes and/or edema.  Right hernia. Testicles are located scrotally bilaterally. No masses are appreciated in the testicles. Left and right epididymis are normal. Rectal: Patient with  normal sphincter tone.  Perineum without scarring or rashes. No rectal masses are appreciated. Prostate is approximately 55 grams, irregular,  no nodules are appreciated. Seminal vesicles are normal.   Laboratory Data: Lab Results  Component Value Date   WBC 7.3 12/18/2011   HGB 13.1 12/18/2011   HCT 41.0 12/18/2011   MCV 93 12/18/2011   PLT 202 12/18/2011   Lab Results  Component Value Date   CREATININE 1.12 12/18/2011   Previous PSA's:     5.67 ng/mL on 09/15/2013     6.9 ng/mL on 05/11/2014     7.3 ng/mL on 08/10/2014     3.5 ng/mL on 02/03/2015     6.2 ng/mL on 08/02/2015     4.4 ng/mL on 11/08/2015      Pertinent Imaging: Results for orders placed or performed in visit on 11/08/15  BLADDER SCAN AMB NON-IMAGING  Result Value Ref Range   Scan Result 144ml     Assessment & Plan:    1. Elevated PSA:   Patient has undergone PSA screening for many years. It has been elevated on occasion. At this time, he would like to continue screening so we will increase the threshold for prostate biopsy to 10 ng/mL.  Patient is agreeable to this plan.  2. BPH with LUTS:   Patient's IPSS score is 6/2.  His PVR 131 mL.  His DRE demonstrates enlargement, no nodules.  He will continue the tamsulosin and restart the finasteride. His PVR is increasing but he is not bothered with his lower urinary tract symptoms at this time.   He will follow up in 12 months for a PSA, IPSS and PVR.    - BLADDER SCAN AMB NON-IMAGING  3. Erectile dysfunction:   Patient has decided that he is not interested in pursuing treatment for his erectile dysfunction at this time.  Return in about 1 year (around 11/07/2016) for IPSS, PSA and exam.  Zara Council,  [facility]  [facility] [address] [place], [place] [address] [phone]

## 2015-11-09 ENCOUNTER — Telehealth: Payer: Self-pay

## 2015-11-09 LAB — PSA: Prostate Specific Ag, Serum: 4.4 ng/mL — ABNORMAL HIGH (ref 0.0–4.0)

## 2015-11-09 NOTE — Telephone Encounter (Signed)
LMOM

## 2015-11-09 NOTE — Telephone Encounter (Signed)
-----   Message from Nori Riis, PA-C sent at 11/09/2015  8:45 AM EDT ----- PSA has decreased.  We will see him in one year.

## 2015-11-09 NOTE — Telephone Encounter (Signed)
Spoke with pt wife and made aware of PSA results. Wife voiced understanding.

## 2015-11-16 DIAGNOSIS — K4091 Unilateral inguinal hernia, without obstruction or gangrene, recurrent: Secondary | ICD-10-CM | POA: Diagnosis not present

## 2015-11-22 ENCOUNTER — Inpatient Hospital Stay: Admission: RE | Admit: 2015-11-22 | Payer: Self-pay | Source: Ambulatory Visit

## 2015-11-30 ENCOUNTER — Encounter
Admission: RE | Admit: 2015-11-30 | Discharge: 2015-11-30 | Disposition: A | Discharge status: Home / Self Care | Payer: PPO | Source: Ambulatory Visit | Attending: Surgery | Admitting: Surgery

## 2015-11-30 DIAGNOSIS — R0602 Shortness of breath: Secondary | ICD-10-CM | POA: Diagnosis not present

## 2015-11-30 DIAGNOSIS — I10 Essential (primary) hypertension: Secondary | ICD-10-CM | POA: Diagnosis not present

## 2015-11-30 DIAGNOSIS — F419 Anxiety disorder, unspecified: Secondary | ICD-10-CM | POA: Diagnosis not present

## 2015-11-30 DIAGNOSIS — K4091 Unilateral inguinal hernia, without obstruction or gangrene, recurrent: Secondary | ICD-10-CM | POA: Diagnosis not present

## 2015-11-30 DIAGNOSIS — J449 Chronic obstructive pulmonary disease, unspecified: Secondary | ICD-10-CM | POA: Diagnosis not present

## 2015-11-30 DIAGNOSIS — Z87891 Personal history of nicotine dependence: Secondary | ICD-10-CM | POA: Diagnosis not present

## 2015-11-30 DIAGNOSIS — I739 Peripheral vascular disease, unspecified: Secondary | ICD-10-CM | POA: Diagnosis not present

## 2015-11-30 LAB — BASIC METABOLIC PANEL
Anion gap: 9 (ref 5–15)
BUN: 39 mg/dL — ABNORMAL HIGH (ref 6–20)
CO2: 20 mmol/L — ABNORMAL LOW (ref 22–32)
Calcium: 8.9 mg/dL (ref 8.9–10.3)
Chloride: 109 mmol/L (ref 101–111)
Creatinine, Ser: 0.89 mg/dL (ref 0.61–1.24)
GFR calc Af Amer: >60 mL/min (ref >60)
GFR calc non Af Amer: >60 mL/min (ref >60)
Glucose, Bld: 96 mg/dL (ref 65–99)
Potassium: 4.4 mmol/L (ref 3.5–5.1)
Sodium: 138 mmol/L (ref 135–145)

## 2015-11-30 NOTE — Patient Instructions (Signed)
Your procedure is scheduled on: Friday December 02, 2015 Su procedimiento est programado para: Report to Same Day Surgery - second floor medical mall entrance  To find out your arrival time please call [phone] between 1PM - 3PM on Thursday December 01, 2015 Para saber su hora de llegada por favor llame al [phone] entre la 1PM - 3PM el da:  Remember: Instructions that are not followed completely may result in serious medical risk, up to and including death, or upon the discretion of your surgeon and anesthesiologist your surgery may need to be rescheduled.  Recuerde: Las instrucciones que no se siguen completamente Heritage manager en un riesgo de salud grave, incluyendo hasta la [place] o a discrecin de su cirujano y Environmental health practitioner, su ciruga se puede posponer.   __X__ 1. Do not eat food or drink liquids after midnight. No gum chewing or hard candies.  No coma alimentos ni tome lquidos despus de la medianoche.  No mastique chicle ni caramelos  duros.     __X__ 2. No alcohol for 24 hours before or after surgery.    No tome alcohol durante las 24 horas antes ni despus de la [place].   __X_ 3. Bring all medications with you on the day of surgery if instructed.    Lleve todos los medicamentos con usted el da de su ciruga si se le ha indicado as.   ___X_ 4. Notify your doctor if there is any change in your medical condition (cold, fever,                             infections).    Informe a su mdico si hay algn cambio en su condicin mdica (resfriado, fiebre, infecciones).   Do not wear jewelry, make-up, hairpins, clips or nail polish.  No use joyas, maquillajes, pinzas/ganchos para el cabello ni esmalte de uas.  Do not wear lotions, powders, or perfumes. You may wear deodorant.  No use lociones, polvos o perfumes.  Puede usar desodorante.    Do not shave 48 hours prior to surgery. Men may shave face and neck.  No se afeite 48 horas antes de la [place].  Los hombres pueden  [employer] cara y el cuello.   Do not bring valuables to the hospital.   No lleve objetos [facility] is not responsible for any belongings or valuables.  [facility] no se hace responsable de ningn tipo de pertenencias u objetos de Geographical information systems officer.               Contacts, dentures or bridgework may not be worn into surgery.  Los lentes de [place], las dentaduras postizas o puentes no se pueden usar en la [place].  Leave your suitcase in the car. After surgery it may be brought to your room.  Deje su maleta en el auto.  Despus de la ciruga podr traerla a su habitacin.  For patients admitted to the hospital, discharge time is determined by your treatment team.  Para los pacientes que sean ingresados al hospital, el tiempo en el cual se le dar de alta es determinado por su                equipo de [place].   Patients discharged the day of surgery will not be allowed to drive home. A los pacientes que se les da de alta el mismo da de la ciruga no se les permitir conducir a Holiday representative.  Please read over the following fact sheets that you were given: Por favor [place] informacin que le dieron:     __X__ Take these medicines the morning of surgery with A SIP OF WATER:          [employer] medicinas la maana de la ciruga con UN SORBO DE AGUA:  1.Amlodipine  2.Lisinopril  3.   4.       5.  6.  ____ Fleet Enema (as directed)          Enema de Fleet (segn lo indicado)    ___X_ Use CHG Soap as directed          Utilice el jabn de CHG segn lo indicado  ___X_ Use inhalers on the day of surgery          Use los inhaladores el da de la ciruga  ____ Stop metformin 2 days prior to surgery          Deje de tomar el metformin 2 das antes de la ciruga    ____ Take 1/2 of usual insulin dose the night before surgery and none on the morning of surgery           Tome la mitad de la dosis habitual de insulina la noche antes de la [place] y no  tome nada en la maana de la             ciruga  ___X_ Stop Coumadin/Plavix/aspirin on (pt stopped a week ago)          Deje de tomar el Coumadin/Plavix/aspirina el da:  ___X_ Stop Anti-inflammatories today- use tylenol if needed          Deje de tomar antiinflamatorios el da:   ____ Stop supplements until after surgery            Deje de tomar suplementos hasta despus de la ciruga  ____ Bring C-Pap to the hospital          [facility]

## 2015-11-30 NOTE — Pre-Procedure Instructions (Signed)
EKG WITH KNOWN RBBB ON 2016 EKG

## 2015-12-02 ENCOUNTER — Ambulatory Visit: Payer: PPO | Admitting: Anesthesiology

## 2015-12-02 ENCOUNTER — Encounter
Admission: RE | Disposition: A | Discharge status: Home / Self Care | Payer: Self-pay | Source: Ambulatory Visit | Attending: Surgery

## 2015-12-02 ENCOUNTER — Ambulatory Visit
Admission: RE | Admit: 2015-12-02 | Discharge: 2015-12-02 | Disposition: A | Discharge status: Home / Self Care | Payer: PPO | Source: Ambulatory Visit | Attending: Surgery | Admitting: Surgery

## 2015-12-02 ENCOUNTER — Encounter: Payer: Self-pay | Admitting: Anesthesiology

## 2015-12-02 DIAGNOSIS — R0602 Shortness of breath: Secondary | ICD-10-CM | POA: Insufficient documentation

## 2015-12-02 DIAGNOSIS — J449 Chronic obstructive pulmonary disease, unspecified: Secondary | ICD-10-CM | POA: Insufficient documentation

## 2015-12-02 DIAGNOSIS — I10 Essential (primary) hypertension: Secondary | ICD-10-CM | POA: Insufficient documentation

## 2015-12-02 DIAGNOSIS — F419 Anxiety disorder, unspecified: Secondary | ICD-10-CM | POA: Insufficient documentation

## 2015-12-02 DIAGNOSIS — I739 Peripheral vascular disease, unspecified: Secondary | ICD-10-CM | POA: Insufficient documentation

## 2015-12-02 DIAGNOSIS — K4091 Unilateral inguinal hernia, without obstruction or gangrene, recurrent: Secondary | ICD-10-CM | POA: Insufficient documentation

## 2015-12-02 DIAGNOSIS — Z87891 Personal history of nicotine dependence: Secondary | ICD-10-CM | POA: Insufficient documentation

## 2015-12-02 HISTORY — PX: INGUINAL HERNIA REPAIR: SHX194

## 2015-12-02 SURGERY — REPAIR, HERNIA, INGUINAL, ADULT
Anesthesia: General | Laterality: Right | Wound class: Clean

## 2015-12-02 MED ORDER — ONDANSETRON HCL 4 MG/2ML IJ SOLN
INTRAMUSCULAR | Status: DC | PRN
  Administered 2015-12-02: 4 mg via INTRAVENOUS

## 2015-12-02 MED ORDER — BUPIVACAINE-EPINEPHRINE (PF) 0.5% -1:200000 IJ SOLN
INTRAMUSCULAR | Status: AC
  Filled 2015-12-02: qty 30

## 2015-12-02 MED ORDER — FAMOTIDINE 20 MG PO TABS
ORAL_TABLET | ORAL | Status: AC
  Administered 2015-12-02: 20 mg via ORAL
  Filled 2015-12-02: qty 1

## 2015-12-02 MED ORDER — CEFAZOLIN SODIUM-DEXTROSE 2-4 GM/100ML-% IV SOLN
2.0000 g | Freq: Once | INTRAVENOUS | Status: AC
  Administered 2015-12-02: 2 g via INTRAVENOUS

## 2015-12-02 MED ORDER — FENTANYL CITRATE (PF) 100 MCG/2ML IJ SOLN
25.0000 ug | INTRAMUSCULAR | Status: DC | PRN
  Administered 2015-12-02 (×4): 25 ug via INTRAVENOUS

## 2015-12-02 MED ORDER — LIDOCAINE HCL (CARDIAC) 20 MG/ML IV SOLN
INTRAVENOUS | Status: DC | PRN
  Administered 2015-12-02: 100 mg via INTRAVENOUS

## 2015-12-02 MED ORDER — LACTATED RINGERS IV SOLN
INTRAVENOUS | Status: DC
  Administered 2015-12-02 (×3): via INTRAVENOUS

## 2015-12-02 MED ORDER — PROPOFOL 10 MG/ML IV BOLUS
INTRAVENOUS | Status: DC | PRN
  Administered 2015-12-02: 120 mg via INTRAVENOUS

## 2015-12-02 MED ORDER — BUPIVACAINE-EPINEPHRINE (PF) 0.5% -1:200000 IJ SOLN
INTRAMUSCULAR | Status: DC | PRN
  Administered 2015-12-02: 22 mL

## 2015-12-02 MED ORDER — FAMOTIDINE 20 MG PO TABS
20.0000 mg | ORAL_TABLET | Freq: Once | ORAL | Status: AC
  Administered 2015-12-02: 20 mg via ORAL

## 2015-12-02 MED ORDER — HYDROCODONE-ACETAMINOPHEN 5-325 MG PO TABS
1.0000 | ORAL_TABLET | ORAL | Status: DC | PRN
Start: 2015-12-02 — End: 2015-12-02

## 2015-12-02 MED ORDER — FENTANYL CITRATE (PF) 100 MCG/2ML IJ SOLN
INTRAMUSCULAR | Status: AC
  Administered 2015-12-02: 25 ug via INTRAVENOUS
  Filled 2015-12-02: qty 2

## 2015-12-02 MED ORDER — HYDROCODONE-ACETAMINOPHEN 5-325 MG PO TABS: 1.0000 | ORAL_TABLET | ORAL | Status: DC | PRN

## 2015-12-02 MED ORDER — CEFAZOLIN SODIUM-DEXTROSE 2-4 GM/100ML-% IV SOLN
INTRAVENOUS | Status: AC
  Administered 2015-12-02: 2 g via INTRAVENOUS
  Filled 2015-12-02: qty 100

## 2015-12-02 MED ORDER — FENTANYL CITRATE (PF) 100 MCG/2ML IJ SOLN
INTRAMUSCULAR | Status: DC | PRN
  Administered 2015-12-02 (×2): 50 ug via INTRAVENOUS
  Administered 2015-12-02 (×2): 100 ug via INTRAVENOUS

## 2015-12-02 MED ORDER — EPHEDRINE SULFATE 50 MG/ML IJ SOLN
INTRAMUSCULAR | Status: DC | PRN
  Administered 2015-12-02: 10 mg via INTRAVENOUS

## 2015-12-02 MED ORDER — DEXAMETHASONE SODIUM PHOSPHATE 10 MG/ML IJ SOLN
INTRAMUSCULAR | Status: DC | PRN
  Administered 2015-12-02: 10 mg via INTRAVENOUS

## 2015-12-02 MED ORDER — ONDANSETRON HCL 4 MG/2ML IJ SOLN: 4.0000 mg | Freq: Once | INTRAMUSCULAR | Status: DC | PRN

## 2015-12-02 SURGICAL SUPPLY — 25 items
BLADE SURG 15 STRL LF DISP TIS (BLADE) ×1 IMPLANT
BLADE SURG 15 STRL SS (BLADE) ×2
CANISTER SUCT 1200ML W/VALVE (MISCELLANEOUS) ×3 IMPLANT
CHLORAPREP W/TINT 26ML (MISCELLANEOUS) ×3 IMPLANT
DRAIN PENROSE 5/8X18 LTX STRL (WOUND CARE) ×3 IMPLANT
DRAPE LAPAROTOMY 77X122 PED (DRAPES) ×3 IMPLANT
ELECT REM PT RETURN 9FT ADLT (ELECTROSURGICAL) ×3
ELECTRODE REM PT RTRN 9FT ADLT (ELECTROSURGICAL) ×1 IMPLANT
GLOVE BIO SURGEON STRL SZ7.5 (GLOVE) ×3 IMPLANT
GOWN STRL REUS W/ TWL LRG LVL3 (GOWN DISPOSABLE) ×3 IMPLANT
GOWN STRL REUS W/TWL LRG LVL3 (GOWN DISPOSABLE) ×6
KIT RM TURNOVER STRD PROC AR (KITS) ×3 IMPLANT
LABEL OR SOLS (LABEL) ×3 IMPLANT
LIQUID BAND (GAUZE/BANDAGES/DRESSINGS) ×3 IMPLANT
MESH SYNTHETIC 4X6 SOFT BARD (Mesh General) ×1 IMPLANT
MESH SYNTHETIC SOFT BARD 4X6 (Mesh General) ×2 IMPLANT
NEEDLE HYPO 25X1 1.5 SAFETY (NEEDLE) ×3 IMPLANT
NS IRRIG 500ML POUR BTL (IV SOLUTION) ×3 IMPLANT
PACK BASIN MINOR ARMC (MISCELLANEOUS) ×3 IMPLANT
SUT CHROMIC 4 0 RB 1X27 (SUTURE) ×3 IMPLANT
SUT MNCRL AB 4-0 PS2 18 (SUTURE) ×3 IMPLANT
SUT SURGILON 0 30 BLK (SUTURE) ×9 IMPLANT
SUT VIC AB 4-0 SH 27 (SUTURE) ×4
SUT VIC AB 4-0 SH 27XANBCTRL (SUTURE) ×2 IMPLANT
SYRINGE 10CC LL (SYRINGE) ×3 IMPLANT

## 2015-12-02 NOTE — Anesthesia Preprocedure Evaluation (Addendum)
Anesthesia Evaluation  Patient identified by MRN, date of birth, ID band Patient awake    Reviewed: Allergy & Precautions, NPO status , Patient's Chart, lab work & pertinent test results, reviewed documented beta blocker date and time   Airway Mallampati: II  TM Distance: >3 FB     Dental  (+) Chipped   Pulmonary shortness of breath and with exertion, COPD, former smoker,           Cardiovascular hypertension, Pt. on medications + Peripheral Vascular Disease       Neuro/Psych PSYCHIATRIC DISORDERS Anxiety    GI/Hepatic   Endo/Other    Renal/GU      Musculoskeletal   Abdominal   Peds  Hematology   Anesthesia Other Findings EKG OK, old RBBB.  Reproductive/Obstetrics                            Anesthesia Physical Anesthesia Plan  ASA: III  Anesthesia Plan: General   Post-op Pain Management:    Induction: Intravenous  Airway Management Planned: LMA  Additional Equipment:   Intra-op Plan:   Post-operative Plan:   Informed Consent: I have reviewed the patients History and Physical, chart, labs and discussed the procedure including the risks, benefits and alternatives for the proposed anesthesia with the patient or authorized representative who has indicated his/her understanding and acceptance.     Plan Discussed with: CRNA  Anesthesia Plan Comments:         Anesthesia Quick Evaluation

## 2015-12-02 NOTE — OR Nursing (Signed)
Dr. Tamala Julian visited.  [place] [facility]

## 2015-12-02 NOTE — Anesthesia Postprocedure Evaluation (Signed)
Anesthesia Post Note  Patient: Ruben Green  Procedure(s) Performed: Procedure(s) (LRB): HERNIA REPAIR INGUINAL ADULT (Right)  Patient location during evaluation: PACU Anesthesia Type: General Level of consciousness: awake and alert Pain management: pain level controlled Vital Signs Assessment: post-procedure vital signs reviewed and stable Respiratory status: spontaneous breathing and respiratory function stable Cardiovascular status: stable Anesthetic complications: no    Last Vitals:  Filed Vitals:   12/02/15 1825 12/02/15 1830  BP:  149/77  Pulse: 90 89  Temp: 37.2 C 36.7 C  Resp: 20 16    Last Pain:  Filed Vitals:   12/02/15 1836  PainSc: 2                  KEPHART,WILLIAM K

## 2015-12-02 NOTE — H&P (Signed)
  He reports no change in condition since the day of the office examination.  He has a history of recurrent right inguinal hernia with associated pain.  The right side was marked YES. I discussed the plan for right inguinal hernia repair.

## 2015-12-02 NOTE — Transfer of Care (Signed)
Immediate Anesthesia Transfer of Care Note  Patient: Ruben Green  Procedure(s) Performed: Procedure(s): HERNIA REPAIR INGUINAL ADULT (Right)  Patient Location: PACU  Anesthesia Type:General  Level of Consciousness: awake and sedated  Airway & Oxygen Therapy: Patient Spontanous Breathing and Patient connected to face mask oxygen  Post-op Assessment: Report given to RN and Post -op Vital signs reviewed and stable  Post vital signs: Reviewed and stable  Last Vitals:  Filed Vitals:   12/02/15 1347 12/02/15 1743  BP: 126/71 167/82  Pulse: 80 102  Temp: 36.5 C 36.9 C  Resp: 16 14    Last Pain:  Filed Vitals:   12/02/15 1746  PainSc: Asleep         Complications: No apparent anesthesia complications

## 2015-12-02 NOTE — Op Note (Signed)
OPERATIVE REPORT  PREOPERATIVE DIAGNOSIS: Recurrent right inguinal hernia  POSTOPERATIVE DIAGNOSIS: Recurrent right  inguinal hernia  PROCEDURE: Recurrent right inguinal hernia repair  ANESTHESIA:  General  SURGEON:  Rochel Brome M.D.  INDICATIONS: He has had recent pain in the right groin and a right inguinal hernia was demonstrated on physical exam and repair was recommended for definitive treatment.  With the patient on the operating table in the supine position the right lower quadrant was prepared with clippers and with ChloraPrep and draped in a sterile manner. A transversely oriented suprapubic incision was made and carried down through subcutaneous tissues. Electrocautery was used for hemostasis. The Scarpa's fascia was incised. The external oblique aponeurosis was incised along the course of its fibers to open the external ring and expose the inguinal cord structures. The cord structures were mobilized. A Penrose drain was passed around the cord structures for traction. There was scar tissue appreciated from previous surgery. There was a finding of a direct inguinal hernia. The hernia sac was dissected free from surrounding structures. The attenuated transversalis fascia was incised circumferentially and removed and was not sent for pathology. The sac some 6 cm in length was opened its continuity with the peritoneal cavity was demonstrated. A small amount of bowel was pushed back into the abdominal cavity and ligation of the sac was done with a 0 Surgilon suture ligature and the sac was amputated and was not submitted for pathology. The repair was carried out with 0 Surgilon sutures suturing the conjoined tendon to the shelving edge of the inguinal ligament incorporating transversalis fascia into the repair. The last stitch led to satisfactory narrowing of the internal ring. There was also some weakness of the fascia superior to the internal ring which was imbricated with interrupted 0  Surgilon sutures. Bard soft mesh was cut to create an oval shape and was placed over the repair. This was sutured to the repair with interrupted 0 Surgilon sutures and also sutured medially to the deep fascia and on both sides of the internal ring. Next after seeing hemostasis was intact the arch structures were replaced along the floor of the inguinal canal. The cut edges of the external oblique aponeurosis were closed with a running 4-0 Vicryl suture to re-create the external ring. The deep fascia superior and lateral to the repair site was infiltrated with half percent Sensorcaine with epinephrine. Subcutaneous tissues were also infiltrated. The Scarpa's fascia was closed with interrupted 4-0 Vicryl sutures. The skin was closed with running 4-0 Monocryl subcuticular suture and LiquiBand. The testicle remained in the scrotum  The patient appeared to be in satisfactory condition and was prepared for transfer to the recovery room.  Rochel Brome M.D.

## 2015-12-02 NOTE — Anesthesia Procedure Notes (Signed)
Procedure Name: LMA Insertion Date/Time: 12/02/2015 4:10 PM Performed by: Nelda Marseille Pre-anesthesia Checklist: Patient identified, Patient being monitored, Timeout performed, Emergency Drugs available and Suction available Patient Re-evaluated:Patient Re-evaluated prior to inductionOxygen Delivery Method: Circle system utilized Preoxygenation: Pre-oxygenation with 100% oxygen Intubation Type: IV induction Ventilation: Mask ventilation without difficulty LMA: LMA inserted LMA Size: 4.5 Tube type: Oral Number of attempts: 1 Placement Confirmation: positive ETCO2 and breath sounds checked- equal and bilateral Tube secured with: Tape Dental Injury: Teeth and Oropharynx as per pre-operative assessment

## 2015-12-02 NOTE — Discharge Instructions (Signed)
Take Tylenol or Norco if needed for pain.  May resume aspirin on Sunday.  May shower.  Avoid straining and heavy lifting.   AMBULATORY SURGERY  DISCHARGE INSTRUCTIONS   1) The drugs that you were given will stay in your system until tomorrow so for the next 24 hours you should not:  A) Drive an automobile B) Make any legal decisions C) Drink any alcoholic beverage   2) You may resume regular meals tomorrow.  Today it is better to start with liquids and gradually work up to solid foods.  You may eat anything you prefer, but it is better to start with liquids, then soup and crackers, and gradually work up to solid foods.   3) Please notify your doctor immediately if you have any unusual bleeding, trouble breathing, redness and pain at the surgery site, drainage, fever, or pain not relieved by medication.    4) Additional Instructions:        Please contact your physician with any problems or Same Day Surgery at [phone], Monday through Friday 6 am to 4 pm, or [place] [facility] number at [phone].

## 2015-12-05 ENCOUNTER — Encounter: Payer: Self-pay | Admitting: Surgery

## 2015-12-19 DIAGNOSIS — J42 Unspecified chronic bronchitis: Secondary | ICD-10-CM | POA: Diagnosis not present

## 2015-12-19 DIAGNOSIS — F411 Generalized anxiety disorder: Secondary | ICD-10-CM | POA: Diagnosis not present

## 2015-12-19 DIAGNOSIS — F419 Anxiety disorder, unspecified: Secondary | ICD-10-CM | POA: Diagnosis not present

## 2015-12-19 DIAGNOSIS — Z0000 Encounter for general adult medical examination without abnormal findings: Secondary | ICD-10-CM | POA: Diagnosis not present

## 2015-12-19 DIAGNOSIS — I10 Essential (primary) hypertension: Secondary | ICD-10-CM | POA: Diagnosis not present

## 2015-12-19 DIAGNOSIS — R1031 Right lower quadrant pain: Secondary | ICD-10-CM | POA: Diagnosis not present

## 2015-12-19 DIAGNOSIS — E538 Deficiency of other specified B group vitamins: Secondary | ICD-10-CM | POA: Diagnosis not present

## 2015-12-19 DIAGNOSIS — R739 Hyperglycemia, unspecified: Secondary | ICD-10-CM | POA: Diagnosis not present

## 2015-12-29 DIAGNOSIS — H359 Unspecified retinal disorder: Secondary | ICD-10-CM | POA: Diagnosis not present

## 2016-01-05 ENCOUNTER — Other Ambulatory Visit: Payer: Self-pay | Admitting: Internal Medicine

## 2016-01-05 DIAGNOSIS — J440 Chronic obstructive pulmonary disease with acute lower respiratory infection: Secondary | ICD-10-CM | POA: Diagnosis not present

## 2016-01-05 DIAGNOSIS — F411 Generalized anxiety disorder: Secondary | ICD-10-CM

## 2016-01-05 DIAGNOSIS — E782 Mixed hyperlipidemia: Secondary | ICD-10-CM

## 2016-01-05 DIAGNOSIS — R410 Disorientation, unspecified: Secondary | ICD-10-CM

## 2016-01-05 DIAGNOSIS — I10 Essential (primary) hypertension: Secondary | ICD-10-CM

## 2016-01-05 DIAGNOSIS — J449 Chronic obstructive pulmonary disease, unspecified: Secondary | ICD-10-CM | POA: Diagnosis not present

## 2016-01-05 DIAGNOSIS — J209 Acute bronchitis, unspecified: Secondary | ICD-10-CM

## 2016-01-05 DIAGNOSIS — E538 Deficiency of other specified B group vitamins: Secondary | ICD-10-CM | POA: Diagnosis not present

## 2016-01-13 DIAGNOSIS — H353211 Exudative age-related macular degeneration, right eye, with active choroidal neovascularization: Secondary | ICD-10-CM | POA: Diagnosis not present

## 2016-01-17 ENCOUNTER — Ambulatory Visit
Admission: RE | Admit: 2016-01-17 | Discharge: 2016-01-17 | Disposition: A | Discharge status: Home / Self Care | Payer: PPO | Source: Ambulatory Visit | Attending: Internal Medicine | Admitting: Internal Medicine

## 2016-01-17 DIAGNOSIS — I10 Essential (primary) hypertension: Secondary | ICD-10-CM

## 2016-01-17 DIAGNOSIS — E782 Mixed hyperlipidemia: Secondary | ICD-10-CM | POA: Diagnosis not present

## 2016-01-17 DIAGNOSIS — J440 Chronic obstructive pulmonary disease with acute lower respiratory infection: Secondary | ICD-10-CM | POA: Diagnosis not present

## 2016-01-17 DIAGNOSIS — F411 Generalized anxiety disorder: Secondary | ICD-10-CM

## 2016-01-17 DIAGNOSIS — J209 Acute bronchitis, unspecified: Secondary | ICD-10-CM

## 2016-01-17 DIAGNOSIS — R410 Disorientation, unspecified: Secondary | ICD-10-CM

## 2016-02-24 DIAGNOSIS — H353290 Exudative age-related macular degeneration, unspecified eye, stage unspecified: Secondary | ICD-10-CM | POA: Diagnosis not present

## 2016-03-20 DIAGNOSIS — I10 Essential (primary) hypertension: Secondary | ICD-10-CM | POA: Diagnosis not present

## 2016-03-20 DIAGNOSIS — J40 Bronchitis, not specified as acute or chronic: Secondary | ICD-10-CM | POA: Diagnosis not present

## 2016-03-20 DIAGNOSIS — J42 Unspecified chronic bronchitis: Secondary | ICD-10-CM | POA: Diagnosis not present

## 2016-04-03 DIAGNOSIS — J209 Acute bronchitis, unspecified: Secondary | ICD-10-CM | POA: Diagnosis not present

## 2016-04-03 DIAGNOSIS — R410 Disorientation, unspecified: Secondary | ICD-10-CM | POA: Diagnosis not present

## 2016-04-03 DIAGNOSIS — E782 Mixed hyperlipidemia: Secondary | ICD-10-CM | POA: Diagnosis not present

## 2016-04-03 DIAGNOSIS — J440 Chronic obstructive pulmonary disease with acute lower respiratory infection: Secondary | ICD-10-CM | POA: Diagnosis not present

## 2016-04-03 DIAGNOSIS — I10 Essential (primary) hypertension: Secondary | ICD-10-CM | POA: Diagnosis not present

## 2016-04-03 DIAGNOSIS — F411 Generalized anxiety disorder: Secondary | ICD-10-CM | POA: Diagnosis not present

## 2016-04-11 DIAGNOSIS — Z0000 Encounter for general adult medical examination without abnormal findings: Secondary | ICD-10-CM | POA: Diagnosis not present

## 2016-04-11 DIAGNOSIS — E782 Mixed hyperlipidemia: Secondary | ICD-10-CM | POA: Diagnosis not present

## 2016-04-11 DIAGNOSIS — I10 Essential (primary) hypertension: Secondary | ICD-10-CM | POA: Diagnosis not present

## 2016-04-11 DIAGNOSIS — F411 Generalized anxiety disorder: Secondary | ICD-10-CM | POA: Diagnosis not present

## 2016-04-11 DIAGNOSIS — F419 Anxiety disorder, unspecified: Secondary | ICD-10-CM | POA: Diagnosis not present

## 2016-04-11 DIAGNOSIS — E538 Deficiency of other specified B group vitamins: Secondary | ICD-10-CM | POA: Diagnosis not present

## 2016-04-11 DIAGNOSIS — Z23 Encounter for immunization: Secondary | ICD-10-CM | POA: Diagnosis not present

## 2016-04-11 DIAGNOSIS — J449 Chronic obstructive pulmonary disease, unspecified: Secondary | ICD-10-CM | POA: Diagnosis not present

## 2016-04-27 DIAGNOSIS — H353211 Exudative age-related macular degeneration, right eye, with active choroidal neovascularization: Secondary | ICD-10-CM | POA: Diagnosis not present

## 2016-05-24 DIAGNOSIS — I10 Essential (primary) hypertension: Secondary | ICD-10-CM | POA: Diagnosis not present

## 2016-05-24 DIAGNOSIS — I6523 Occlusion and stenosis of bilateral carotid arteries: Secondary | ICD-10-CM | POA: Diagnosis not present

## 2016-05-24 DIAGNOSIS — I2510 Atherosclerotic heart disease of native coronary artery without angina pectoris: Secondary | ICD-10-CM | POA: Diagnosis not present

## 2016-05-24 DIAGNOSIS — I714 Abdominal aortic aneurysm, without rupture: Secondary | ICD-10-CM | POA: Diagnosis not present

## 2016-05-30 DIAGNOSIS — I2510 Atherosclerotic heart disease of native coronary artery without angina pectoris: Secondary | ICD-10-CM | POA: Insufficient documentation

## 2016-06-09 DIAGNOSIS — J441 Chronic obstructive pulmonary disease with (acute) exacerbation: Secondary | ICD-10-CM | POA: Diagnosis not present

## 2016-07-13 DIAGNOSIS — H353211 Exudative age-related macular degeneration, right eye, with active choroidal neovascularization: Secondary | ICD-10-CM | POA: Diagnosis not present

## 2016-08-06 ENCOUNTER — Ambulatory Visit (INDEPENDENT_AMBULATORY_CARE_PROVIDER_SITE_OTHER): Payer: PPO | Admitting: Vascular Surgery

## 2016-08-06 ENCOUNTER — Other Ambulatory Visit (INDEPENDENT_AMBULATORY_CARE_PROVIDER_SITE_OTHER): Payer: Self-pay | Admitting: Vascular Surgery

## 2016-08-06 ENCOUNTER — Ambulatory Visit (INDEPENDENT_AMBULATORY_CARE_PROVIDER_SITE_OTHER): Payer: PPO

## 2016-08-06 VITALS — BP 171/91 | HR 94 | Resp 16 | Wt 165.6 lb

## 2016-08-06 DIAGNOSIS — I6529 Occlusion and stenosis of unspecified carotid artery: Secondary | ICD-10-CM

## 2016-08-06 DIAGNOSIS — I714 Abdominal aortic aneurysm, without rupture, unspecified: Secondary | ICD-10-CM

## 2016-08-06 DIAGNOSIS — J449 Chronic obstructive pulmonary disease, unspecified: Secondary | ICD-10-CM

## 2016-08-06 DIAGNOSIS — I6523 Occlusion and stenosis of bilateral carotid arteries: Secondary | ICD-10-CM

## 2016-08-06 DIAGNOSIS — I10 Essential (primary) hypertension: Secondary | ICD-10-CM | POA: Diagnosis not present

## 2016-08-06 DIAGNOSIS — Z48812 Encounter for surgical aftercare following surgery on the circulatory system: Secondary | ICD-10-CM

## 2016-08-06 LAB — VAS US CAROTID
LEFT ECA DIAS: -19 cm/s
Left CCA dist dias: 13 cm/s
Left CCA dist sys: 57 cm/s
Left CCA prox dias: 17 cm/s
Left CCA prox sys: 114 cm/s
Left ICA dist dias: -16 cm/s
Left ICA dist sys: -45 cm/s
Left ICA prox dias: 28 cm/s
Left ICA prox sys: 103 cm/s
RIGHT CCA MID DIAS: 16 cm/s
RIGHT ECA DIAS: -11 cm/s
Right CCA prox dias: 17 cm/s
Right CCA prox sys: 122 cm/s
Right cca dist sys: -56 cm/s

## 2016-08-08 NOTE — Progress Notes (Signed)
MRN : [id number]  Ruben Green is a 83 y.o. (Feb 04, 1934) male who presents with chief complaint of  Chief Complaint  Patient presents with  . Follow-up  .  History of Present Illness: The patient is seen for follow up evaluation of carotid stenosis. The carotid stenosis followed by ultrasound.   The patient denies amaurosis fugax. There is no recent history of TIA symptoms or focal motor deficits. There is no prior documented CVA.  The patient is taking enteric-coated aspirin 81 mg daily.  There is no history of migraine headaches. There is no history of seizures.  The patient has a history of coronary artery disease, no recent episodes of angina or shortness of breath. The patient denies PAD or claudication symptoms. There is a history of hyperlipidemia which is being treated with a statin.    Carotid Duplex done today shows <30% bilateral stenosis s/p successful right CEA.  No change compared to last study   Current Meds  Medication Sig  . albuterol-ipratropium (COMBIVENT) 18-103 MCG/ACT inhaler Inhale 1-2 puffs into the lungs every 4 (four) hours.  Marland Kitchen amLODipine (NORVASC) 10 MG tablet Take 10 mg by mouth daily.  Marland Kitchen aspirin 81 MG tablet Take 81 mg by mouth daily.  Marland Kitchen atorvastatin (LIPITOR) 40 MG tablet Take 40 mg by mouth daily.  . finasteride (PROSCAR) 5 MG tablet Take 1 tablet (5 mg total) by mouth daily.  . furosemide (LASIX) 20 MG tablet Take by mouth.  Marland Kitchen HYDROcodone-acetaminophen (NORCO) 5-325 MG tablet Take 1-2 tablets by mouth every 4 (four) hours as needed for moderate pain.  Marland Kitchen lisinopril (PRINIVIL,ZESTRIL) 40 MG tablet Take 40 mg by mouth daily.  Marland Kitchen LORazepam (ATIVAN) 0.5 MG tablet Take 0.5 mg by mouth every 8 (eight) hours.  . tamsulosin (FLOMAX) 0.4 MG CAPS capsule Take 1 capsule (0.4 mg total) by mouth daily.    Past Medical History:  Diagnosis Date  . Anxiety   . BPH with obstruction/lower urinary tract symptoms   . Chronic bronchitis ([place])   . COPD  (chronic obstructive pulmonary disease) ([place])   . Elevated PSA   . HLD (hyperlipidemia)   . HTN (hypertension)   . Incomplete bladder emptying     Past Surgical History:  Procedure Laterality Date  . ABDOMINAL AORTIC ANEURYSM REPAIR    . HERNIA REPAIR     x 2  . INGUINAL HERNIA REPAIR Right 12/02/2015   Procedure: HERNIA REPAIR INGUINAL ADULT;  Surgeon: Leonie Green, MD;  Location: [facility] ORS;  Service: General;  Laterality: Right;  . PROSTATE SURGERY  2012  . Right Carotid Endarterectomy      Social History Social History  Substance Use Topics  . Smoking status: Former Smoker    Quit date: 11/30/1998  . Smokeless tobacco: Not on file     Comment: quit 10 years ago  . Alcohol use No    Family History Family History  Problem Relation Age of Onset  . Prostate cancer Neg Hx   . Bladder Cancer Neg Hx   No family history of bleeding/clotting disorders, porphyria or autoimmune disease   No Known Allergies   REVIEW OF SYSTEMS (Negative unless checked)  Constitutional: [] Weight loss  [] Fever  [] Chills Cardiac: [] Chest pain   [] Chest pressure   [] Palpitations   [] Shortness of breath when laying flat   [] Shortness of breath with exertion. Vascular:  [] Pain in legs with walking   [] Pain in legs at rest  [] History of DVT   [] Phlebitis   []   Swelling in legs   [] Varicose veins   [] Non-healing ulcers Pulmonary:   [] Uses home oxygen   [] Productive cough   [] Hemoptysis   [] Wheeze  [] COPD   [] Asthma Neurologic:  [] Dizziness   [] Seizures   [] History of stroke   [] History of TIA  [] Aphasia   [] Vissual changes   [] Weakness or numbness in arm   [] Weakness or numbness in leg Musculoskeletal:   [] Joint swelling   [] Joint pain   [] Low back pain Hematologic:  [] Easy bruising  [] Easy bleeding   [] Hypercoagulable state   [] Anemic Gastrointestinal:  [] Diarrhea   [] Vomiting  [] Gastroesophageal reflux/heartburn   [] Difficulty swallowing. Genitourinary:  [] Chronic kidney disease   [] Difficult  urination  [] Frequent urination   [] Blood in urine Skin:  [] Rashes   [] Ulcers  Psychological:  [] History of anxiety   []  History of major depression.  Physical Examination  Vitals:   08/06/16 1444 08/06/16 1445  BP: (!) 180/92 (!) 171/91  Pulse: 94   Resp: 16   Weight: 165 lb 9.6 oz (75.1 kg)    Body mass index is 31.29 kg/m. Gen: WD/WN, NAD Head: [place]/AT, No temporalis wasting.  Ear/Nose/Throat: Hearing grossly intact, nares w/o erythema or drainage, poor dentition Eyes: PER, EOMI, sclera nonicteric.  Neck: Supple, no masses.  No bruit or JVD.  Pulmonary:  Good air movement, clear to auscultation bilaterally, no use of accessory muscles.  Cardiac: RRR, normal S1, S2, no Murmurs. Vascular:  Vessel Right Left  Radial Palpable Palpable  Ulnar Palpable Palpable  Brachial Palpable Palpable  Carotid Palpable Palpable  Femoral Palpable Palpable  Popliteal Palpable Palpable  PT Palpable Palpable  DP Palpable Palpable   Gastrointestinal: soft, non-distended. No guarding/no peritoneal signs.  Musculoskeletal: M/S 5/5 throughout.  No deformity or atrophy.  Neurologic: CN 2-12 intact. Pain and light touch intact in extremities.  Symmetrical.  Speech is fluent. Motor exam as listed above. Psychiatric: Judgment intact, Mood & affect appropriate for pt's clinical situation. Dermatologic: No rashes or ulcers noted.  No changes consistent with cellulitis. Lymph : No Cervical lymphadenopathy, no lichenification or skin changes of chronic lymphedema.  CBC Lab Results  Component Value Date   WBC 7.3 12/18/2011   HGB 13.1 12/18/2011   HCT 41.0 12/18/2011   MCV 93 12/18/2011   PLT 202 12/18/2011    BMET    Component Value Date/Time   NA 138 11/30/2015 1348   NA 138 12/18/2011 1452   K 4.4 11/30/2015 1348   K 4.3 12/18/2011 1452   CL 109 11/30/2015 1348   CL 104 12/18/2011 1452   CO2 20 (L) 11/30/2015 1348   CO2 30 12/18/2011 1452   GLUCOSE 96 11/30/2015 1348   GLUCOSE 88  12/18/2011 1452   BUN 39 (H) 11/30/2015 1348   BUN 16 12/18/2011 1452   CREATININE 0.89 11/30/2015 1348   CREATININE 1.12 12/18/2011 1452   CALCIUM 8.9 11/30/2015 1348   CALCIUM 8.6 12/18/2011 1452   GFRNONAA >60 11/30/2015 1348   GFRNONAA >60 12/18/2011 1452   GFRAA >60 11/30/2015 1348   GFRAA >60 12/18/2011 1452   CrCl cannot be calculated (Patient's most recent lab result is older than the maximum 21 days allowed.).  COAG No results found for: INR, PROTIME  Radiology No results found.  Assessment/Plan 1. Atherosclerosis of both carotid arteries Recommend:  Given the patient's asymptomatic subcritical stenosis no further invasive testing or surgery at this time.  Duplex ultrasound shows <50% stenosis bilaterally.  Continue antiplatelet therapy as prescribed Continue management of  CAD, HTN and Hyperlipidemia Healthy heart diet,  encouraged exercise at least 4 times per week Follow up in 12 months with duplex ultrasound and physical exam based on <50% stenosis of the  carotid artery   - VAS US CAROTID; Future  2. Abdominal aortic aneurysm (AAA) without rupture ([place]) No surgery or intervention at this time. The patient has an asymptomatic abdominal aortic aneurysm that is less than 4 cm in maximal diameter.  I have discussed the natural history of abdominal aortic aneurysm and the small risk of rupture for aneurysm less than 5 cm in size.  However, as these small aneurysms tend to enlarge over time, continued surveillance with ultrasound or CT scan is mandatory.  I have also discussed optimizing medical management with hypertension and lipid control and the importance of abstinence from tobacco.  The patient is also encouraged to exercise a minimum of 30 minutes 4 times a week.  Should the patient develop new onset abdominal or back pain or signs of peripheral embolization they are instructed to seek medical attention immediately and to alert the physician providing care that  they have an aneurysm.  The patient voices their understanding. The patient will return in 12 months with an aortic duplex.  - VAS US AORTA/IVC/ILIACS; Future  3. Essential (primary) hypertension Continue antihypertensive medications as already ordered, these medications have been reviewed and there are no changes at this time.   4. Chronic obstructive pulmonary disease, unspecified COPD type ([place]) Continue pulmonary medications and aerosols as already ordered, these medications have been reviewed and there are no changes at this time.      Hortencia Pilar, MD  08/08/2016 7:56 AM

## 2016-08-10 DIAGNOSIS — H353290 Exudative age-related macular degeneration, unspecified eye, stage unspecified: Secondary | ICD-10-CM | POA: Diagnosis not present

## 2016-08-10 DIAGNOSIS — R6889 Other general symptoms and signs: Secondary | ICD-10-CM | POA: Diagnosis not present

## 2016-08-10 DIAGNOSIS — J029 Acute pharyngitis, unspecified: Secondary | ICD-10-CM | POA: Diagnosis not present

## 2016-08-10 DIAGNOSIS — J069 Acute upper respiratory infection, unspecified: Secondary | ICD-10-CM | POA: Diagnosis not present

## 2016-08-28 DIAGNOSIS — I10 Essential (primary) hypertension: Secondary | ICD-10-CM | POA: Diagnosis not present

## 2016-08-28 DIAGNOSIS — Z125 Encounter for screening for malignant neoplasm of prostate: Secondary | ICD-10-CM | POA: Diagnosis not present

## 2016-08-28 DIAGNOSIS — F411 Generalized anxiety disorder: Secondary | ICD-10-CM | POA: Diagnosis not present

## 2016-08-28 DIAGNOSIS — Z0000 Encounter for general adult medical examination without abnormal findings: Secondary | ICD-10-CM | POA: Diagnosis not present

## 2016-08-28 DIAGNOSIS — E782 Mixed hyperlipidemia: Secondary | ICD-10-CM | POA: Diagnosis not present

## 2016-08-28 DIAGNOSIS — J449 Chronic obstructive pulmonary disease, unspecified: Secondary | ICD-10-CM | POA: Diagnosis not present

## 2016-08-28 DIAGNOSIS — F419 Anxiety disorder, unspecified: Secondary | ICD-10-CM | POA: Diagnosis not present

## 2016-09-04 DIAGNOSIS — E538 Deficiency of other specified B group vitamins: Secondary | ICD-10-CM | POA: Diagnosis not present

## 2016-09-04 DIAGNOSIS — I2510 Atherosclerotic heart disease of native coronary artery without angina pectoris: Secondary | ICD-10-CM | POA: Diagnosis not present

## 2016-09-04 DIAGNOSIS — E782 Mixed hyperlipidemia: Secondary | ICD-10-CM | POA: Diagnosis not present

## 2016-09-04 DIAGNOSIS — J42 Unspecified chronic bronchitis: Secondary | ICD-10-CM | POA: Diagnosis not present

## 2016-09-04 DIAGNOSIS — I10 Essential (primary) hypertension: Secondary | ICD-10-CM | POA: Diagnosis not present

## 2016-09-04 DIAGNOSIS — Z0000 Encounter for general adult medical examination without abnormal findings: Secondary | ICD-10-CM | POA: Diagnosis not present

## 2016-09-04 DIAGNOSIS — F419 Anxiety disorder, unspecified: Secondary | ICD-10-CM | POA: Diagnosis not present

## 2016-09-04 DIAGNOSIS — R972 Elevated prostate specific antigen [PSA]: Secondary | ICD-10-CM | POA: Diagnosis not present

## 2016-09-10 DIAGNOSIS — H2511 Age-related nuclear cataract, right eye: Secondary | ICD-10-CM | POA: Diagnosis not present

## 2016-09-18 ENCOUNTER — Encounter: Payer: Self-pay | Admitting: *Deleted

## 2016-09-24 MED ORDER — ARMC OPHTHALMIC DILATING DROPS
1.0000 "application " | OPHTHALMIC | Status: AC
  Administered 2016-09-25: 1 via OPHTHALMIC

## 2016-09-24 MED ORDER — MOXIFLOXACIN HCL 0.5 % OP SOLN: 1.0000 [drp] | OPHTHALMIC | Status: DC | PRN

## 2016-09-25 ENCOUNTER — Ambulatory Visit: Payer: PPO | Admitting: Anesthesiology

## 2016-09-25 ENCOUNTER — Encounter: Payer: Self-pay | Admitting: *Deleted

## 2016-09-25 ENCOUNTER — Ambulatory Visit
Admission: RE | Admit: 2016-09-25 | Discharge: 2016-09-25 | Disposition: A | Discharge status: Home / Self Care | Payer: PPO | Source: Ambulatory Visit | Attending: Ophthalmology | Admitting: Ophthalmology

## 2016-09-25 ENCOUNTER — Encounter
Admission: RE | Disposition: A | Discharge status: Home / Self Care | Payer: Self-pay | Source: Ambulatory Visit | Attending: Ophthalmology

## 2016-09-25 DIAGNOSIS — I10 Essential (primary) hypertension: Secondary | ICD-10-CM | POA: Diagnosis not present

## 2016-09-25 DIAGNOSIS — E785 Hyperlipidemia, unspecified: Secondary | ICD-10-CM | POA: Insufficient documentation

## 2016-09-25 DIAGNOSIS — H2511 Age-related nuclear cataract, right eye: Secondary | ICD-10-CM | POA: Insufficient documentation

## 2016-09-25 DIAGNOSIS — Z79899 Other long term (current) drug therapy: Secondary | ICD-10-CM | POA: Insufficient documentation

## 2016-09-25 DIAGNOSIS — F419 Anxiety disorder, unspecified: Secondary | ICD-10-CM | POA: Insufficient documentation

## 2016-09-25 DIAGNOSIS — Z87891 Personal history of nicotine dependence: Secondary | ICD-10-CM | POA: Diagnosis not present

## 2016-09-25 DIAGNOSIS — J449 Chronic obstructive pulmonary disease, unspecified: Secondary | ICD-10-CM | POA: Insufficient documentation

## 2016-09-25 HISTORY — DX: Other allergic rhinitis: J30.89

## 2016-09-25 HISTORY — DX: Unspecified hearing loss, unspecified ear: H91.90

## 2016-09-25 HISTORY — PX: CATARACT EXTRACTION W/PHACO: SHX586

## 2016-09-25 SURGERY — PHACOEMULSIFICATION, CATARACT, WITH IOL INSERTION
Anesthesia: Monitor Anesthesia Care | Site: Eye | Laterality: Right | Wound class: Clean

## 2016-09-25 MED ORDER — TRYPAN BLUE 0.06 % OP SOLN
OPHTHALMIC | Status: AC
  Filled 2016-09-25: qty 0.5

## 2016-09-25 MED ORDER — EPINEPHRINE PF 1 MG/ML IJ SOLN
INTRAOCULAR | Status: DC | PRN
  Administered 2016-09-25: 1 mL via OPHTHALMIC

## 2016-09-25 MED ORDER — ARMC OPHTHALMIC DILATING DROPS
OPHTHALMIC | Status: AC
  Administered 2016-09-25: 06:00:00
  Filled 2016-09-25: qty 0.4

## 2016-09-25 MED ORDER — SODIUM CHLORIDE 0.9 % IV SOLN
INTRAVENOUS | Status: DC | PRN
  Administered 2016-09-25: 08:00:00 via INTRAVENOUS

## 2016-09-25 MED ORDER — NA CHONDROIT SULF-NA HYALURON 40-17 MG/ML IO SOLN
INTRAOCULAR | Status: DC | PRN
  Administered 2016-09-25: 1 mL via INTRAOCULAR

## 2016-09-25 MED ORDER — MOXIFLOXACIN HCL 0.5 % OP SOLN
OPHTHALMIC | Status: AC
  Filled 2016-09-25: qty 3

## 2016-09-25 MED ORDER — LIDOCAINE HCL (PF) 4 % IJ SOLN
INTRAOCULAR | Status: DC | PRN
  Administered 2016-09-25: 2 mL via OPHTHALMIC

## 2016-09-25 MED ORDER — POVIDONE-IODINE 5 % OP SOLN
OPHTHALMIC | Status: DC | PRN
Start: 2016-09-25 — End: 2016-09-25
  Administered 2016-09-25: 1 via OPHTHALMIC

## 2016-09-25 MED ORDER — POVIDONE-IODINE 5 % OP SOLN
OPHTHALMIC | Status: AC
  Filled 2016-09-25: qty 30

## 2016-09-25 MED ORDER — SODIUM CHLORIDE 0.9 % IV SOLN: 500.0000 mL | INTRAVENOUS | Status: DC

## 2016-09-25 MED ORDER — CARBACHOL 0.01 % IO SOLN
INTRAOCULAR | Status: DC | PRN
  Administered 2016-09-25: .5 mL via INTRAOCULAR

## 2016-09-25 MED ORDER — MOXIFLOXACIN HCL 0.5 % OP SOLN
OPHTHALMIC | Status: DC | PRN
  Administered 2016-09-25: .2 mL via OPHTHALMIC

## 2016-09-25 MED ORDER — FENTANYL CITRATE (PF) 100 MCG/2ML IJ SOLN
INTRAMUSCULAR | Status: AC
  Filled 2016-09-25: qty 2

## 2016-09-25 MED ORDER — NA CHONDROIT SULF-NA HYALURON 40-17 MG/ML IO SOLN
INTRAOCULAR | Status: AC
  Filled 2016-09-25: qty 1

## 2016-09-25 MED ORDER — EPINEPHRINE PF 1 MG/ML IJ SOLN
INTRAMUSCULAR | Status: AC
  Filled 2016-09-25: qty 2

## 2016-09-25 MED ORDER — FENTANYL CITRATE (PF) 100 MCG/2ML IJ SOLN
INTRAMUSCULAR | Status: DC | PRN
  Administered 2016-09-25: 25 ug via INTRAVENOUS
  Administered 2016-09-25: 50 ug via INTRAVENOUS

## 2016-09-25 MED ORDER — SODIUM CHLORIDE 0.9 % IV SOLN: INTRAVENOUS | Status: DC

## 2016-09-25 SURGICAL SUPPLY — 21 items
CANNULA ANT/CHMB 27GA (MISCELLANEOUS) ×3 IMPLANT
CUP MEDICINE 2OZ PLAST GRAD ST (MISCELLANEOUS) ×3 IMPLANT
GLOVE BIO SURGEON STRL SZ8 (GLOVE) ×3 IMPLANT
GLOVE BIOGEL M 6.5 STRL (GLOVE) ×3 IMPLANT
GLOVE SURG LX 8.0 MICRO (GLOVE) ×2
GLOVE SURG LX STRL 8.0 MICRO (GLOVE) ×1 IMPLANT
GOWN STRL REUS W/ TWL LRG LVL3 (GOWN DISPOSABLE) ×2 IMPLANT
GOWN STRL REUS W/TWL LRG LVL3 (GOWN DISPOSABLE) ×4
LENS IOL TECNIS ITEC 15.5 (Intraocular Lens) ×3 IMPLANT
PACK CATARACT (MISCELLANEOUS) ×3 IMPLANT
PACK CATARACT BRASINGTON LX (MISCELLANEOUS) ×3 IMPLANT
PACK EYE AFTER SURG (MISCELLANEOUS) ×3 IMPLANT
SOL BSS BAG (MISCELLANEOUS) ×3
SOL PREP PVP 2OZ (MISCELLANEOUS) ×3
SOLUTION BSS BAG (MISCELLANEOUS) ×1 IMPLANT
SOLUTION PREP PVP 2OZ (MISCELLANEOUS) ×1 IMPLANT
SYR 3ML LL SCALE MARK (SYRINGE) ×3 IMPLANT
SYR 5ML LL (SYRINGE) ×3 IMPLANT
SYR TB 1ML 27GX1/2 LL (SYRINGE) ×3 IMPLANT
WATER STERILE IRR 250ML POUR (IV SOLUTION) ×3 IMPLANT
WIPE NON LINTING 3.25X3.25 (MISCELLANEOUS) ×3 IMPLANT

## 2016-09-25 NOTE — Anesthesia Postprocedure Evaluation (Signed)
Anesthesia Post Note  Patient: Ruben Green  Procedure(s) Performed: Procedure(s) (LRB): CATARACT EXTRACTION PHACO AND INTRAOCULAR LENS PLACEMENT (IOC) (Right)  Patient location during evaluation: Short Stay Anesthesia Type: MAC Level of consciousness: awake and alert and oriented Pain management: pain level controlled Vital Signs Assessment: post-procedure vital signs reviewed and stable Respiratory status: spontaneous breathing Cardiovascular status: stable Postop Assessment: no headache Anesthetic complications: no     Last Vitals:  Vitals:   09/25/16 0758 09/25/16 0759  BP: (!) 142/67 (!) 142/67  Pulse: 73 71  Resp: 12 18  Temp: (!) 36.1 C     Last Pain:  Vitals:   09/25/16 0758  TempSrc: Filbert Berthold

## 2016-09-25 NOTE — Anesthesia Postprocedure Evaluation (Signed)
Anesthesia Post Note  Patient: Ruben Green  Procedure(s) Performed: Procedure(s) (LRB): CATARACT EXTRACTION PHACO AND INTRAOCULAR LENS PLACEMENT (IOC) (Right)  Patient location during evaluation: PACU Anesthesia Type: MAC Level of consciousness: awake and alert Pain management: pain level controlled Vital Signs Assessment: post-procedure vital signs reviewed and stable Respiratory status: spontaneous breathing, nonlabored ventilation and respiratory function stable Cardiovascular status: stable and blood pressure returned to baseline Anesthetic complications: no     Last Vitals:  Vitals:   09/25/16 0758 09/25/16 0759  BP: (!) 142/67 (!) 142/67  Pulse: 73 71  Resp: 12 18  Temp: (!) 36.1 C     Last Pain:  Vitals:   09/25/16 0758  TempSrc: Filbert Berthold

## 2016-09-25 NOTE — Anesthesia Post-op Follow-up Note (Cosign Needed)
Anesthesia QCDR form completed.        

## 2016-09-25 NOTE — Anesthesia Preprocedure Evaluation (Addendum)
Anesthesia Evaluation  Patient identified by MRN, date of birth, ID band Patient awake    Reviewed: Allergy & Precautions, NPO status , Patient's Chart, lab work & pertinent test results  History of Anesthesia Complications Negative for: history of anesthetic complications  Airway Mallampati: II  TM Distance: >3 FB Neck ROM: Full    Dental no notable dental hx.    Pulmonary neg sleep apnea, COPD,  COPD inhaler, former smoker,    breath sounds clear to auscultation- rhonchi (-) wheezing      Cardiovascular hypertension, Pt. on medications (-) CAD and (-) Past MI  Rhythm:Regular Rate:Normal - Systolic murmurs and - Diastolic murmurs    Neuro/Psych PSYCHIATRIC DISORDERS Anxiety negative neurological ROS     GI/Hepatic negative GI ROS, Neg liver ROS,   Endo/Other  negative endocrine ROSneg diabetes  Renal/GU negative Renal ROS     Musculoskeletal negative musculoskeletal ROS (+)   Abdominal (+) - obese,   Peds  Hematology negative hematology ROS (+)   Anesthesia Other Findings Past Medical History: No date: Anxiety No date: BPH with obstruction/lower urinary tract sympt* No date: Chronic bronchitis (HCC) No date: COPD (chronic obstructive pulmonary disease) (* No date: Elevated PSA No date: Environmental and seasonal allergies No date: HLD (hyperlipidemia) No date: HOH (hard of hearing) No date: HTN (hypertension) No date: Incomplete bladder emptying   Reproductive/Obstetrics                             Anesthesia Physical Anesthesia Plan  ASA: III  Anesthesia Plan: MAC   Post-op Pain Management:    Induction: Intravenous  Airway Management Planned: Natural Airway  Additional Equipment:   Intra-op Plan:   Post-operative Plan:   Informed Consent: I have reviewed the patients History and Physical, chart, labs and discussed the procedure including the risks, benefits and  alternatives for the proposed anesthesia with the patient or authorized representative who has indicated his/her understanding and acceptance.     Plan Discussed with: CRNA and Anesthesiologist  Anesthesia Plan Comments:         Anesthesia Quick Evaluation

## 2016-09-25 NOTE — Op Note (Signed)
PREOPERATIVE DIAGNOSIS:  Nuclear sclerotic cataract of the right eye.   POSTOPERATIVE DIAGNOSIS:  nuclear sclerotic cataract right eye   OPERATIVE PROCEDURE: Procedure(s): CATARACT EXTRACTION PHACO AND INTRAOCULAR LENS PLACEMENT (IOC)   SURGEON:  Birder Robson, MD.   ANESTHESIA:  Anesthesiologist: Emmie Niemann, MD CRNA: Jonna Clark, CRNA  1.      Managed anesthesia care. 2.      0.26ml of Shugarcaine was instilled in the eye following the paracentesis.   COMPLICATIONS:  None.   TECHNIQUE:   Stop and chop   DESCRIPTION OF PROCEDURE:  The patient was examined and consented in the preoperative holding area where the aforementioned topical anesthesia was applied to the right eye and then brought back to the Operating Room where the right eye was prepped and draped in the usual sterile ophthalmic fashion and a lid speculum was placed. A paracentesis was created with the side port blade and the anterior chamber was filled with viscoelastic. A near clear corneal incision was performed with the steel keratome. A continuous curvilinear capsulorrhexis was performed with a cystotome followed by the capsulorrhexis forceps. Hydrodissection and hydrodelineation were carried out with BSS on a blunt cannula. The lens was removed in a stop and chop  technique and the remaining cortical material was removed with the irrigation-aspiration handpiece. The capsular bag was inflated with viscoelastic and the Technis [id number]  lens was placed in the capsular bag without complication. The remaining viscoelastic was removed from the eye with the irrigation-aspiration handpiece. The wounds were hydrated. The anterior chamber was flushed with Miostat and the eye was inflated to physiologic pressure. 0.60ml of Vigamox was placed in the anterior chamber. The wounds were found to be water tight. The eye was dressed with Vigamox. The patient was given protective glasses to wear throughout the day and a shield with which to  sleep tonight. The patient was also given drops with which to begin a drop regimen today and will follow-up with me in one day.  Implant Name Type Inv. Item Serial No. Manufacturer Lot No. LRB No. Used  LENS IOL DIOP 15.5 - [id number] Intraocular Lens LENS IOL DIOP 15.5 [id number] AMO   Right 1   Procedure(s) with comments: CATARACT EXTRACTION PHACO AND INTRAOCULAR LENS PLACEMENT (IOC) (Right) - [place] 01:01 AP% 17.4 CDE 10.75 Fluid pack lot # [id number]  Electronically signed: Max 09/25/2016 7:57 AM

## 2016-09-25 NOTE — H&P (Signed)
All labs reviewed. Abnormal studies sent to patients PCP when indicated.  Previous H&P reviewed, patient examined, there are NO CHANGES.  Ruben Green LOUIS4/10/20187:17 AM

## 2016-09-25 NOTE — Transfer of Care (Signed)
Immediate Anesthesia Transfer of Care Note  Patient: Ruben Green  Procedure(s) Performed: Procedure(s) with comments: CATARACT EXTRACTION PHACO AND INTRAOCULAR LENS PLACEMENT (IOC) (Right) - [place] 01:01 AP% 17.4 CDE 10.75 Fluid pack lot # [id number]  Patient Location: PACU and Short Stay  Anesthesia Type:MAC  Level of Consciousness: awake, alert  and oriented  Airway & Oxygen Therapy: Patient Spontanous Breathing  Post-op Assessment: Report given to RN and Post -op Vital signs reviewed and stable  Post vital signs: Reviewed and stable  Last Vitals:  Vitals:   09/25/16 0758 09/25/16 0759  BP: (!) 142/67 (!) 142/67  Pulse: 73 71  Resp: 12 18  Temp: (!) 36.1 C     Last Pain:  Vitals:   09/25/16 0758  TempSrc: Temporal         Complications: No apparent anesthesia complications

## 2016-09-25 NOTE — Addendum Note (Signed)
Addendum  created 09/25/16 0810 by Jonna Clark, CRNA   Sign clinical note

## 2016-09-25 NOTE — Discharge Instructions (Signed)
Eye Surgery Discharge Instructions  Expect mild scratchy sensation or mild soreness. DO NOT RUB YOUR EYE!  The day of surgery:  Minimal physical activity, but bed rest is not required  No reading, computer work, or close hand work  No bending, lifting, or straining.  May watch TV  For 24 hours:  No driving, legal decisions, or alcoholic beverages  Safety precautions  Eat anything you prefer: It is better to start with liquids, then soup then solid foods.  _____ Eye patch should be worn until postoperative exam tomorrow.  ____ Solar shield eyeglasses should be worn for comfort in the sunlight/patch while sleeping  Resume all regular medications including aspirin or Coumadin if these were discontinued prior to surgery. You may shower, bathe, shave, or wash your hair. Tylenol may be taken for mild discomfort.  Call your doctor if you experience significant pain, nausea, or vomiting, fever > 101 or other signs of infection. [phone] or [phone] Specific instructions:  Follow-up Information    PORFILIO,WILLIAM LOUIS, MD Follow up on 09/26/2016.   Specialty:  Ophthalmology Why:  10:00 am Contact information: [address] [place] [address] [phone]

## 2016-10-10 DIAGNOSIS — H2512 Age-related nuclear cataract, left eye: Secondary | ICD-10-CM | POA: Diagnosis not present

## 2016-10-11 ENCOUNTER — Encounter: Payer: Self-pay | Admitting: *Deleted

## 2016-10-15 DIAGNOSIS — H353212 Exudative age-related macular degeneration, right eye, with inactive choroidal neovascularization: Secondary | ICD-10-CM | POA: Diagnosis not present

## 2016-10-16 ENCOUNTER — Ambulatory Visit: Payer: PPO | Admitting: Anesthesiology

## 2016-10-16 ENCOUNTER — Ambulatory Visit
Admission: RE | Admit: 2016-10-16 | Discharge: 2016-10-16 | Disposition: A | Discharge status: Home / Self Care | Payer: PPO | Source: Ambulatory Visit | Attending: Ophthalmology | Admitting: Ophthalmology

## 2016-10-16 ENCOUNTER — Encounter: Payer: Self-pay | Admitting: *Deleted

## 2016-10-16 ENCOUNTER — Encounter
Admission: RE | Disposition: A | Discharge status: Home / Self Care | Payer: Self-pay | Source: Ambulatory Visit | Attending: Ophthalmology

## 2016-10-16 DIAGNOSIS — I739 Peripheral vascular disease, unspecified: Secondary | ICD-10-CM | POA: Insufficient documentation

## 2016-10-16 DIAGNOSIS — Z87891 Personal history of nicotine dependence: Secondary | ICD-10-CM | POA: Insufficient documentation

## 2016-10-16 DIAGNOSIS — H2512 Age-related nuclear cataract, left eye: Secondary | ICD-10-CM | POA: Diagnosis not present

## 2016-10-16 DIAGNOSIS — I10 Essential (primary) hypertension: Secondary | ICD-10-CM | POA: Diagnosis not present

## 2016-10-16 DIAGNOSIS — J449 Chronic obstructive pulmonary disease, unspecified: Secondary | ICD-10-CM | POA: Diagnosis not present

## 2016-10-16 DIAGNOSIS — Z79899 Other long term (current) drug therapy: Secondary | ICD-10-CM | POA: Diagnosis not present

## 2016-10-16 DIAGNOSIS — F419 Anxiety disorder, unspecified: Secondary | ICD-10-CM | POA: Insufficient documentation

## 2016-10-16 HISTORY — PX: CATARACT EXTRACTION W/PHACO: SHX586

## 2016-10-16 SURGERY — PHACOEMULSIFICATION, CATARACT, WITH IOL INSERTION
Anesthesia: Monitor Anesthesia Care | Site: Eye | Laterality: Left | Wound class: Clean

## 2016-10-16 MED ORDER — SODIUM CHLORIDE 0.9 % IV SOLN
INTRAVENOUS | Status: DC
  Administered 2016-10-16: 06:00:00 via INTRAVENOUS

## 2016-10-16 MED ORDER — MOXIFLOXACIN HCL 0.5 % OP SOLN: 1.0000 [drp] | OPHTHALMIC | Status: DC | PRN

## 2016-10-16 MED ORDER — ARMC OPHTHALMIC DILATING DROPS
OPHTHALMIC | Status: AC
  Filled 2016-10-16: qty 0.4

## 2016-10-16 MED ORDER — TRYPAN BLUE 0.06 % OP SOLN
OPHTHALMIC | Status: AC
  Filled 2016-10-16: qty 0.5

## 2016-10-16 MED ORDER — BSS IO SOLN
INTRAOCULAR | Status: DC | PRN
  Administered 2016-10-16: 4 mL via OPHTHALMIC

## 2016-10-16 MED ORDER — NA CHONDROIT SULF-NA HYALURON 40-17 MG/ML IO SOLN
INTRAOCULAR | Status: AC
  Filled 2016-10-16: qty 1

## 2016-10-16 MED ORDER — ARMC OPHTHALMIC DILATING DROPS
1.0000 "application " | OPHTHALMIC | Status: AC
  Administered 2016-10-16 (×3): 1 via OPHTHALMIC

## 2016-10-16 MED ORDER — NA CHONDROIT SULF-NA HYALURON 40-17 MG/ML IO SOLN
INTRAOCULAR | Status: DC | PRN
  Administered 2016-10-16: 1 mL via INTRAOCULAR

## 2016-10-16 MED ORDER — CARBACHOL 0.01 % IO SOLN
INTRAOCULAR | Status: DC | PRN
  Administered 2016-10-16: 0.5 mL via INTRAOCULAR

## 2016-10-16 MED ORDER — POVIDONE-IODINE 5 % OP SOLN
OPHTHALMIC | Status: DC | PRN
  Administered 2016-10-16: 1 via OPHTHALMIC

## 2016-10-16 MED ORDER — EPINEPHRINE PF 1 MG/ML IJ SOLN
INTRAOCULAR | Status: DC | PRN
  Administered 2016-10-16: 07:00:00 via OPHTHALMIC

## 2016-10-16 MED ORDER — LIDOCAINE HCL (PF) 4 % IJ SOLN
INTRAMUSCULAR | Status: AC
  Filled 2016-10-16: qty 5

## 2016-10-16 MED ORDER — MOXIFLOXACIN HCL 0.5 % OP SOLN
OPHTHALMIC | Status: AC
  Filled 2016-10-16: qty 3

## 2016-10-16 MED ORDER — MIDAZOLAM HCL 2 MG/2ML IJ SOLN
INTRAMUSCULAR | Status: AC
  Filled 2016-10-16: qty 2

## 2016-10-16 MED ORDER — MIDAZOLAM HCL 2 MG/2ML IJ SOLN
INTRAMUSCULAR | Status: DC | PRN
  Administered 2016-10-16 (×2): 1 mg via INTRAVENOUS

## 2016-10-16 MED ORDER — POVIDONE-IODINE 5 % OP SOLN
OPHTHALMIC | Status: AC
  Filled 2016-10-16: qty 30

## 2016-10-16 MED ORDER — MOXIFLOXACIN HCL 0.5 % OP SOLN
OPHTHALMIC | Status: DC | PRN
  Administered 2016-10-16: 0.2 mL via OPHTHALMIC

## 2016-10-16 MED ORDER — EPINEPHRINE PF 1 MG/ML IJ SOLN
INTRAMUSCULAR | Status: AC
  Filled 2016-10-16: qty 2

## 2016-10-16 SURGICAL SUPPLY — 22 items
CANNULA ANT/CHMB 27GA (MISCELLANEOUS) ×3 IMPLANT
CUP MEDICINE 2OZ PLAST GRAD ST (MISCELLANEOUS) ×3 IMPLANT
GLOVE BIO SURGEON STRL SZ8 (GLOVE) ×3 IMPLANT
GLOVE BIOGEL M 6.5 STRL (GLOVE) ×3 IMPLANT
GLOVE SURG LX 8.0 MICRO (GLOVE) ×2
GLOVE SURG LX STRL 8.0 MICRO (GLOVE) ×1 IMPLANT
GOWN STRL REUS W/ TWL LRG LVL3 (GOWN DISPOSABLE) ×2 IMPLANT
GOWN STRL REUS W/TWL LRG LVL3 (GOWN DISPOSABLE) ×4
LENS IOL TECNIS ITEC 11.5 (Intraocular Lens) ×3 IMPLANT
PACK CATARACT (MISCELLANEOUS) ×3 IMPLANT
PACK CATARACT BRASINGTON LX (MISCELLANEOUS) ×3 IMPLANT
PACK EYE AFTER SURG (MISCELLANEOUS) ×3 IMPLANT
SOL BSS BAG (MISCELLANEOUS) ×3
SOL PREP PVP 2OZ (MISCELLANEOUS) ×3
SOLUTION BSS BAG (MISCELLANEOUS) ×1 IMPLANT
SOLUTION PREP PVP 2OZ (MISCELLANEOUS) ×1 IMPLANT
SUT ETHILON 10 0 CS140 6 (SUTURE) ×3 IMPLANT
SYR 3ML LL SCALE MARK (SYRINGE) ×3 IMPLANT
SYR 5ML LL (SYRINGE) ×3 IMPLANT
SYR TB 1ML 27GX1/2 LL (SYRINGE) ×3 IMPLANT
WATER STERILE IRR 250ML POUR (IV SOLUTION) ×3 IMPLANT
WIPE NON LINTING 3.25X3.25 (MISCELLANEOUS) ×3 IMPLANT

## 2016-10-16 NOTE — Anesthesia Postprocedure Evaluation (Signed)
Anesthesia Post Note  Patient: Eeshan G Rineer  Procedure(s) Performed: Procedure(s) (LRB): CATARACT EXTRACTION PHACO AND INTRAOCULAR LENS PLACEMENT (IOC) Suture placed in Left eye (Left)  Patient location during evaluation: PACU Anesthesia Type: MAC Level of consciousness: awake and alert Pain management: pain level controlled Vital Signs Assessment: post-procedure vital signs reviewed and stable Respiratory status: spontaneous breathing, nonlabored ventilation, respiratory function stable and patient connected to nasal cannula oxygen Cardiovascular status: stable and blood pressure returned to baseline Anesthetic complications: no     Last Vitals:  Vitals:   10/16/16 0612  BP: (!) 168/70  Pulse: 82  Resp: 16  Temp: 36.6 C    Last Pain:  Vitals:   10/16/16 0612  TempSrc: Oral                 Dayven Linsley C

## 2016-10-16 NOTE — Discharge Instructions (Signed)
Eye Surgery Discharge Instructions  Expect mild scratchy sensation or mild soreness. DO NOT RUB YOUR EYE!  The day of surgery:  Minimal physical activity, but bed rest is not required  No reading, computer work, or close hand work  No bending, lifting, or straining.  May watch TV  For 24 hours:  No driving, legal decisions, or alcoholic beverages  Safety precautions  Eat anything you prefer: It is better to start with liquids, then soup then solid foods.  _____ Eye patch should be worn until postoperative exam tomorrow.  ____ Solar shield eyeglasses should be worn for comfort in the sunlight/patch while sleeping  Resume all regular medications including aspirin or Coumadin if these were discontinued prior to surgery. You may shower, bathe, shave, or wash your hair. Tylenol may be taken for mild discomfort.  Call your doctor if you experience significant pain, nausea, or vomiting, fever > 101 or other signs of infection. [phone] or [phone] Specific instructions:  Follow-up Information    PORFILIO,WILLIAM LOUIS, MD Follow up on 10/17/2016.   Specialty:  Ophthalmology Why:  10:10 Contact information: [address] [place] [address] [phone]

## 2016-10-16 NOTE — Op Note (Addendum)
PREOPERATIVE DIAGNOSIS:  Nuclear sclerotic cataract of the left eye.   POSTOPERATIVE DIAGNOSIS:  NUCLEAR SCLEROTIC CATARACT LEFT EYE   OPERATIVE PROCEDURE:  Procedure(s): CATARACT EXTRACTION PHACO AND INTRAOCULAR LENS PLACEMENT (IOC) Suture placed in Left eye   SURGEON:  Birder Robson, MD.   ANESTHESIA:   Anesthesiologist: Gunnar Fusi, MD CRNA: Jennette Bill  1.      Managed anesthesia care. 2.      Topical tetracaine drops followed by 2% Xylocaine jelly applied in the preoperative holding area.   COMPLICATIONS:  None.   TECHNIQUE:   Stop and chop   DESCRIPTION OF PROCEDURE:  The patient was examined and consented in the preoperative holding area where the aforementioned topical anesthesia was applied to the left eye and then brought back to the Operating Room where the left eye was prepped and draped in the usual sterile ophthalmic fashion and a lid speculum was placed. A paracentesis was created with the side port blade and the anterior chamber was filled with viscoelastic. A near clear corneal incision was performed with the steel keratome. A continuous curvilinear capsulorrhexis was performed with a cystotome followed by the capsulorrhexis forceps. Hydrodissection and hydrodelineation were carried out with BSS on a blunt cannula. The lens was removed in a stop and chop  technique and the remaining cortical material was removed with the irrigation-aspiration handpiece. The capsular bag was inflated with viscoelastic and the Technis [id number] lens was placed in the capsular bag without complication. The remaining viscoelastic was removed from the eye with the irrigation-aspiration handpiece. The wounds were hydrated. The anterior chamber was flushed with Miostat and the eye was inflated to physiologic pressure. 0.1 mL of Vigamox was placed in the anterior chamber. A single 10-0 nylon suture was placed in the main incision.The wounds were found to be water tight. The eye was dressed with  Vigamox. The patient was given protective glasses to wear throughout the day and a shield with which to sleep tonight. The patient was also given drops with which to begin a drop regimen today and will follow-up with me in one day.  Implant Name Type Inv. Item Serial No. Manufacturer Lot No. LRB No. Used  LENS IOL DIOP 11.5 - [id number] Intraocular Lens LENS IOL DIOP 11.5 [id number] AMO   Left 1   Procedure(s) with comments: CATARACT EXTRACTION PHACO AND INTRAOCULAR LENS PLACEMENT (IOC) Suture placed in Left eye (Left) - [place] 2:06.8 AP% 22.2 CDE 28.17 Fluid pack lot # [id number]  Electronically signed: [place] 10/16/2016 7:53 AM

## 2016-10-16 NOTE — Transfer of Care (Signed)
Immediate Anesthesia Transfer of Care Note  Patient: Ruben Green  Procedure(s) Performed: Procedure(s) with comments: CATARACT EXTRACTION PHACO AND INTRAOCULAR LENS PLACEMENT ([place]) Suture placed in Left eye (Left) - [place] 2:06.8 AP% 22.2 CDE 28.17 Fluid pack lot # [id number]  Patient Location: PACU  Anesthesia Type:MAC  Level of Consciousness: awake, alert  and oriented  Airway & Oxygen Therapy: Patient Spontanous Breathing and Patient connected to nasal cannula oxygen  Post-op Assessment: Report given to RN and Post -op Vital signs reviewed and stable  Post vital signs: Reviewed and stable  Last Vitals:  Vitals:   10/16/16 0612  BP: (!) 168/70  Pulse: 82  Resp: 16  Temp: 36.6 C    Last Pain:  Vitals:   10/16/16 0612  TempSrc: Oral         Complications: No apparent anesthesia complications

## 2016-10-16 NOTE — Anesthesia Post-op Follow-up Note (Cosign Needed)
Anesthesia QCDR form completed.        

## 2016-10-16 NOTE — H&P (Signed)
All labs reviewed. Abnormal studies sent to patients PCP when indicated.  Previous H&P reviewed, patient examined, there are NO CHANGES.  Ruben Pho LOUIS5/1/20187:16 AM

## 2016-10-16 NOTE — Anesthesia Preprocedure Evaluation (Signed)
Anesthesia Evaluation  Patient identified by MRN, date of birth, ID band Patient awake    Reviewed: Allergy & Precautions, NPO status , Patient's Chart, lab work & pertinent test results  History of Anesthesia Complications Negative for: history of anesthetic complications  Airway Mallampati: III       Dental   Pulmonary COPD,  COPD inhaler, former smoker,           Cardiovascular hypertension, Pt. on medications + Peripheral Vascular Disease       Neuro/Psych Anxiety    GI/Hepatic negative GI ROS, Neg liver ROS,   Endo/Other  negative endocrine ROS  Renal/GU negative Renal ROS     Musculoskeletal   Abdominal   Peds  Hematology negative hematology ROS (+)   Anesthesia Other Findings   Reproductive/Obstetrics                             Anesthesia Physical Anesthesia Plan  ASA: II  Anesthesia Plan: MAC   Post-op Pain Management:    Induction: Intravenous  Airway Management Planned: Nasal Cannula  Additional Equipment:   Intra-op Plan:   Post-operative Plan:   Informed Consent: I have reviewed the patients History and Physical, chart, labs and discussed the procedure including the risks, benefits and alternatives for the proposed anesthesia with the patient or authorized representative who has indicated his/her understanding and acceptance.     Plan Discussed with:   Anesthesia Plan Comments:         Anesthesia Quick Evaluation

## 2016-10-17 ENCOUNTER — Encounter: Payer: Self-pay | Admitting: Ophthalmology

## 2016-10-19 ENCOUNTER — Encounter: Payer: Self-pay | Admitting: Emergency Medicine

## 2016-10-19 ENCOUNTER — Emergency Department: Payer: PPO

## 2016-10-19 ENCOUNTER — Emergency Department
Admission: EM | Admit: 2016-10-19 | Discharge: 2016-10-19 | Disposition: A | Discharge status: Home / Self Care | Payer: PPO | Attending: Student in an Organized Health Care Education/Training Program | Admitting: Student in an Organized Health Care Education/Training Program

## 2016-10-19 DIAGNOSIS — J441 Chronic obstructive pulmonary disease with (acute) exacerbation: Secondary | ICD-10-CM | POA: Diagnosis not present

## 2016-10-19 DIAGNOSIS — Z79899 Other long term (current) drug therapy: Secondary | ICD-10-CM | POA: Diagnosis not present

## 2016-10-19 DIAGNOSIS — R0602 Shortness of breath: Secondary | ICD-10-CM

## 2016-10-19 DIAGNOSIS — Z87891 Personal history of nicotine dependence: Secondary | ICD-10-CM | POA: Diagnosis not present

## 2016-10-19 DIAGNOSIS — Z7982 Long term (current) use of aspirin: Secondary | ICD-10-CM | POA: Diagnosis not present

## 2016-10-19 DIAGNOSIS — I10 Essential (primary) hypertension: Secondary | ICD-10-CM | POA: Insufficient documentation

## 2016-10-19 LAB — FIBRIN DERIVATIVES D-DIMER (ARMC ONLY): Fibrin derivatives D-dimer (ARMC): 1292.26 — ABNORMAL HIGH (ref 0.00–499.00)

## 2016-10-19 LAB — CBC WITH DIFFERENTIAL/PLATELET
Basophils Absolute: 0 10*3/uL (ref 0–0.1)
Basophils Relative: 1 %
Eosinophils Absolute: 0.2 10*3/uL (ref 0–0.7)
Eosinophils Relative: 3 %
HCT: 39.8 % — ABNORMAL LOW (ref 40.0–52.0)
Hemoglobin: 13.1 g/dL (ref 13.0–18.0)
Lymphocytes Relative: 17 %
Lymphs Abs: 1.4 10*3/uL (ref 1.0–3.6)
MCH: 30.3 pg (ref 26.0–34.0)
MCHC: 33 g/dL (ref 32.0–36.0)
MCV: 91.7 fL (ref 80.0–100.0)
Monocytes Absolute: 0.8 10*3/uL (ref 0.2–1.0)
Monocytes Relative: 10 %
Neutro Abs: 5.5 10*3/uL (ref 1.4–6.5)
Neutrophils Relative %: 69 %
Platelets: 173 10*3/uL (ref 150–440)
RBC: 4.34 MIL/uL — ABNORMAL LOW (ref 4.40–5.90)
RDW: 14.2 % (ref 11.5–14.5)
WBC: 7.9 10*3/uL (ref 3.8–10.6)

## 2016-10-19 LAB — BASIC METABOLIC PANEL
Anion gap: 8 (ref 5–15)
BUN: 34 mg/dL — ABNORMAL HIGH (ref 6–20)
CO2: 22 mmol/L (ref 22–32)
Calcium: 8.9 mg/dL (ref 8.9–10.3)
Chloride: 109 mmol/L (ref 101–111)
Creatinine, Ser: 1.01 mg/dL (ref 0.61–1.24)
GFR calc Af Amer: >60 mL/min (ref >60)
GFR calc non Af Amer: >60 mL/min (ref >60)
Glucose, Bld: 95 mg/dL (ref 65–99)
Potassium: 4.4 mmol/L (ref 3.5–5.1)
Sodium: 139 mmol/L (ref 135–145)

## 2016-10-19 LAB — BRAIN NATRIURETIC PEPTIDE: B Natriuretic Peptide: 184 pg/mL — ABNORMAL HIGH (ref 0.0–100.0)

## 2016-10-19 LAB — TROPONIN I: Troponin I: <0.03 ng/mL (ref <0.03)

## 2016-10-19 LAB — MAGNESIUM: Magnesium: 1.9 mg/dL (ref 1.7–2.4)

## 2016-10-19 MED ORDER — ALBUTEROL SULFATE HFA 108 (90 BASE) MCG/ACT IN AERS: 2.0000 | INHALATION_SPRAY | Freq: Four times a day (QID) | RESPIRATORY_TRACT | 2 refills | Status: DC | PRN

## 2016-10-19 MED ORDER — AEROCHAMBER PLUS W/MASK MISC
1.0000 | Freq: Once | Status: DC
  Filled 2016-10-19: qty 1

## 2016-10-19 MED ORDER — PREDNISONE 20 MG PO TABS: 40.0000 mg | ORAL_TABLET | Freq: Every day | ORAL | 0 refills | Status: AC

## 2016-10-19 MED ORDER — IOPAMIDOL (ISOVUE-370) INJECTION 76%
75.0000 mL | Freq: Once | INTRAVENOUS | Status: AC | PRN
  Administered 2016-10-19: 75 mL via INTRAVENOUS

## 2016-10-19 MED ORDER — ALBUTEROL SULFATE (2.5 MG/3ML) 0.083% IN NEBU
5.0000 mg | INHALATION_SOLUTION | Freq: Once | RESPIRATORY_TRACT | Status: AC
  Administered 2016-10-19: 5 mg via RESPIRATORY_TRACT
  Filled 2016-10-19: qty 6

## 2016-10-19 MED ORDER — AEROCHAMBER PLUS MISC
1.0000 | Freq: Once | Status: AC
  Administered 2016-10-19: 1
  Filled 2016-10-19: qty 1

## 2016-10-19 MED ORDER — METHYLPREDNISOLONE SODIUM SUCC 125 MG IJ SOLR
60.0000 mg | Freq: Once | INTRAMUSCULAR | Status: AC
  Administered 2016-10-19: 60 mg via INTRAVENOUS
  Filled 2016-10-19: qty 2

## 2016-10-19 MED ORDER — IPRATROPIUM-ALBUTEROL 0.5-2.5 (3) MG/3ML IN SOLN
3.0000 mL | Freq: Once | RESPIRATORY_TRACT | Status: AC
  Administered 2016-10-19: 3 mL via RESPIRATORY_TRACT
  Filled 2016-10-19: qty 3

## 2016-10-19 MED ORDER — PREDNISONE 20 MG PO TABS
40.0000 mg | ORAL_TABLET | Freq: Once | ORAL | Status: AC
  Administered 2016-10-19: 40 mg via ORAL
  Filled 2016-10-19: qty 2

## 2016-10-19 NOTE — ED Triage Notes (Signed)
Pt reports SOB that began this morning. Denies pain.

## 2016-10-19 NOTE — ED Provider Notes (Signed)
[facility] Emergency Department Provider Note    First MD Initiated Contact with Patient 10/19/16 1534     (approximate)  I have reviewed the triage vital signs and the nursing notes.   HISTORY  Chief Complaint Shortness of Breath    HPI Ruben Green is a 83 y.o. male chief complaint of shortness of breath that started this morning. Patient is convinced that is a cold. Does have long history of smoking but stopped the past several years. Patient denies any chest pains.  Has not been having any productive cough. No lower extremity swelling. No history of blood clots. No history of heart failure. Does have inhalers at home which is not used.   Past Medical History:  Diagnosis Date  . Anxiety   . BPH with obstruction/lower urinary tract symptoms   . Chronic bronchitis ([place])   . COPD (chronic obstructive pulmonary disease) ([place])   . Elevated PSA   . Environmental and seasonal allergies   . HLD (hyperlipidemia)   . HOH (hard of hearing)   . HTN (hypertension)   . Incomplete bladder emptying    Family History  Problem Relation Age of Onset  . Prostate cancer Neg Hx   . Bladder Cancer Neg Hx    Past Surgical History:  Procedure Laterality Date  . ABDOMINAL AORTIC ANEURYSM REPAIR    . CATARACT EXTRACTION W/PHACO Right 09/25/2016   Procedure: CATARACT EXTRACTION PHACO AND INTRAOCULAR LENS PLACEMENT (IOC);  Surgeon: Birder Robson, MD;  Location: [facility] ORS;  Service: Ophthalmology;  Laterality: Right;  [place] 01:01 AP% 17.4 CDE 10.75 Fluid pack lot # [id number]  . CATARACT EXTRACTION W/PHACO Left 10/16/2016   Procedure: CATARACT EXTRACTION PHACO AND INTRAOCULAR LENS PLACEMENT ([place]) Suture placed in Left eye;  Surgeon: Birder Robson, MD;  Location: [facility] ORS;  Service: Ophthalmology;  Laterality: Left;  [place] 2:06.8 AP% 22.2 CDE 28.17 Fluid pack lot # [id number]  . HERNIA REPAIR     x 2  . INGUINAL HERNIA REPAIR Right 12/02/2015   Procedure: HERNIA  REPAIR INGUINAL ADULT;  Surgeon: Leonie Green, MD;  Location: [facility] ORS;  Service: General;  Laterality: Right;  . PROSTATE SURGERY  2012  . Right Carotid Endarterectomy     Patient Active Problem List   Diagnosis Date Noted  . Benign fibroma of prostate 11/08/2015  . Chronic obstructive pulmonary disease ([place]) 11/08/2015  . Essential (primary) hypertension 11/08/2015  . Combined fat and carbohydrate induced hyperlipemia 11/08/2015  . Low serum cobalamin 04/05/2015  . Elevated PSA 02/08/2015  . BPH with obstruction/lower urinary tract symptoms 02/08/2015  . Erectile dysfunction of organic origin 02/08/2015  . Anxiety 08/24/2014  . Abdominal aortic aneurysm (AAA) without rupture ([place]) 05/20/2014  . Carotid artery plaque 05/20/2014  . Breathlessness on exertion 05/20/2014  . Anxiety, generalized 04/20/2014  . Benign prostatic hyperplasia with urinary obstruction 10/27/2012      Prior to Admission medications   Medication Sig Start Date End Date Taking? Authorizing Provider  albuterol-ipratropium (COMBIVENT) 18-103 MCG/ACT inhaler Inhale 1-2 puffs into the lungs every 4 (four) hours.    Historical Provider, MD  amLODipine (NORVASC) 10 MG tablet Take 10 mg by mouth daily.    Historical Provider, MD  aspirin 81 MG tablet Take 81 mg by mouth daily.    Historical Provider, MD  atorvastatin (LIPITOR) 40 MG tablet Take 40 mg by mouth daily.    Historical Provider, MD  budesonide-formoterol (SYMBICORT) 160-4.5 MCG/ACT inhaler Inhale into the lungs. 08/24/14 08/24/15  Historical Provider, MD  finasteride (PROSCAR) 5 MG tablet Take 1 tablet (5 mg total) by mouth daily. 08/09/15   Nori Riis, PA-C  furosemide (LASIX) 20 MG tablet Take by mouth. 04/20/14   Historical Provider, MD  hydrALAZINE (APRESOLINE) 25 MG tablet Take 25 mg by mouth 2 (two) times daily.    Historical Provider, MD  HYDROcodone-homatropine (HYCODAN) 5-1.5 MG/5ML syrup Take 5 mLs by mouth every 6 (six) hours as needed  for cough.    Historical Provider, MD  LORazepam (ATIVAN) 0.5 MG tablet Take 0.5 mg by mouth every 8 (eight) hours.    Historical Provider, MD  losartan (COZAAR) 100 MG tablet Take by mouth. 08/24/14 10/11/16  Historical Provider, MD  sertraline (ZOLOFT) 25 MG tablet Take 25 mg by mouth daily.    Historical Provider, MD  tamsulosin (FLOMAX) 0.4 MG CAPS capsule Take 1 capsule (0.4 mg total) by mouth daily. 02/08/15   Nori Riis, PA-C    Allergies Patient has no known allergies.    Social History Social History  Substance Use Topics  . Smoking status: Former Smoker    Quit date: 11/30/1998  . Smokeless tobacco: Former Systems developer     Comment: quit 10 years ago  . Alcohol use 0.0 oz/week     Comment: occasional    Review of Systems Patient denies headaches, rhinorrhea, blurry vision, numbness, shortness of breath, chest pain, edema, cough, abdominal pain, nausea, vomiting, diarrhea, dysuria, fevers, rashes or hallucinations unless otherwise stated above in HPI. ____________________________________________   PHYSICAL EXAM:  VITAL SIGNS: Vitals:   10/19/16 1529  BP: (!) 168/71  Pulse: (!) 57  Resp: 20  Temp: 98.4 F (36.9 C)    Constitutional: Alert and oriented. tachypnic but smiling and talking to wife. in no acute distress. Eyes: Conjunctivae are normal. PERRL. EOMI. Head: Atraumatic. Nose: No congestion/rhinnorhea. Mouth/Throat: Mucous membranes are moist.  Oropharynx non-erythematous. Neck: No stridor. Painless ROM. No cervical spine tenderness to palpation Hematological/Lymphatic/Immunilogical: No cervical lymphadenopathy. Cardiovascular: Normal rate, regular rhythm. Grossly normal heart sounds.  Good peripheral circulation. Respiratory: tachypnea with prolonged expiratory phase, no rhonchi Gastrointestinal: Soft and nontender. No distention. No abdominal bruits. No CVA tenderness. Musculoskeletal: No lower extremity tenderness nor edema.  No joint  effusions. Neurologic:  Normal speech and language. No gross focal neurologic deficits are appreciated. No gait instability. Skin:  Skin is warm, dry and intact. No rash noted. Psychiatric: Mood and affect are normal. Speech and behavior are normal.  ____________________________________________   LABS (all labs ordered are listed, but only abnormal results are displayed)  Results for orders placed or performed during the hospital encounter of 10/19/16 (from the past 24 hour(s))  CBC with Differential/Platelet     Status: Abnormal   Collection Time: 10/19/16  3:38 PM  Result Value Ref Range   WBC 7.9 3.8 - 10.6 K/uL   RBC 4.34 (L) 4.40 - 5.90 MIL/uL   Hemoglobin 13.1 13.0 - 18.0 g/dL   HCT 39.8 (L) 40.0 - 52.0 %   MCV 91.7 80.0 - 100.0 fL   MCH 30.3 26.0 - 34.0 pg   MCHC 33.0 32.0 - 36.0 g/dL   RDW 14.2 11.5 - 14.5 %   Platelets 173 150 - 440 K/uL   Neutrophils Relative % 69 %   Neutro Abs 5.5 1.4 - 6.5 K/uL   Lymphocytes Relative 17 %   Lymphs Abs 1.4 1.0 - 3.6 K/uL   Monocytes Relative 10 %   Monocytes Absolute 0.8 0.2 - 1.0  K/uL   Eosinophils Relative 3 %   Eosinophils Absolute 0.2 0 - 0.7 K/uL   Basophils Relative 1 %   Basophils Absolute 0.0 0 - 0.1 K/uL  Basic metabolic panel     Status: Abnormal   Collection Time: 10/19/16  3:38 PM  Result Value Ref Range   Sodium 139 135 - 145 mmol/L   Potassium 4.4 3.5 - 5.1 mmol/L   Chloride 109 101 - 111 mmol/L   CO2 22 22 - 32 mmol/L   Glucose, Bld 95 65 - 99 mg/dL   BUN 34 (H) 6 - 20 mg/dL   Creatinine, Ser 1.01 0.61 - 1.24 mg/dL   Calcium 8.9 8.9 - 10.3 mg/dL   GFR calc non Af Amer >60 >60 mL/min   GFR calc Af Amer >60 >60 mL/min   Anion gap 8 5 - 15  Magnesium     Status: None   Collection Time: 10/19/16  3:38 PM  Result Value Ref Range   Magnesium 1.9 1.7 - 2.4 mg/dL  Brain natriuretic peptide     Status: Abnormal   Collection Time: 10/19/16  3:38 PM  Result Value Ref Range   B Natriuretic Peptide 184.0 (H) 0.0 -  100.0 pg/mL  Troponin I     Status: None   Collection Time: 10/19/16  3:38 PM  Result Value Ref Range   Troponin I <0.03 <0.03 ng/mL  Fibrin derivatives D-Dimer ([facility] only)     Status: Abnormal   Collection Time: 10/19/16  4:40 PM  Result Value Ref Range   Fibrin derivatives D-dimer ([facility]) 1,292.26 (H) 0.00 - 499.00   ____________________________________________  EKG My review and personal interpretation at Time: 15:23   Indication: sob  Rate: 95  Rhythm: sinus with frequent PVC  Axis: normal Other: normal intervals, no STEMI ____________________________________________  RADIOLOGY  I personally reviewed all radiographic images ordered to evaluate for the above acute complaints and reviewed radiology reports and findings.  These findings were personally discussed with the patient.  Please see medical record for radiology report.  ____________________________________________   PROCEDURES  Procedure(s) performed:  Procedures    Critical Care performed: no ____________________________________________   INITIAL IMPRESSION / ASSESSMENT AND PLAN / ED COURSE  Pertinent labs & imaging results that were available during my care of the patient were reviewed by me and considered in my medical decision making (see chart for details).  DDX: Asthma, copd, CHF, pna, ptx, malignancy, Pe, anemia   Ruben Green is a 83 y.o. who presents to the ED with shortness of breath as described above. Does not appear to be ischemic in nature as he denies any chest pain. Troponin is negative and symptoms of a gone on for over 8 hours since this morning. Not consistent with ACS. Patient does have underlying COPD and I do suspect a large component of this is secondary to obstructive pulmonary physiology. Will give nebulizer treatments and steroids.  The patient will be placed on continuous pulse oximetry and telemetry for monitoring.  Laboratory evaluation will be sent to evaluate for the above  complaints.  The patient will be placed on continuous pulse oximetry and telemetry for monitoring.  Laboratory evaluation will be sent to evaluate for the above complaints.     Clinical Course as of Oct 20 2310  Fri Oct 19, 2016  1629 MCV: 91.7 [PR]  1730 Ddimer elevated.  Will order cT chest to further eval for PE.  Diod have some improvement with nebs  [PR]  2021 Patient was able to ambulate with a steady gait without any hypoxia. No significant shortness of breath when ambulating about the ER Court order. Patient states he feels much better.  Based on his age and underlying COPD I strongly recommended admission to hospital for additional nebulizer treatments and management. Patient states that he feels better and does not want stay in the hospital. As he has no hypoxia or significant tachycardia and blood work is otherwise reassuring at this point I do feel that that is an appropriate option but have encouraged patient to return immediately should he have any worsening shortness of breath.  Have discussed with the patient and available family all diagnostics and treatments performed thus far and all questions were answered to the best of my ability. The patient demonstrates understanding and agreement with plan.   [PR]    Clinical Course User Index [PR] Merlyn Lot, MD     ____________________________________________   FINAL CLINICAL IMPRESSION(S) / ED DIAGNOSES  Final diagnoses:  SOB (shortness of breath)  COPD exacerbation (HCC)  Shortness of breath      NEW MEDICATIONS STARTED DURING THIS VISIT:  New Prescriptions   No medications on file     Note:  This document was prepared using Dragon voice recognition software and may include unintentional dictation errors.    Merlyn Lot, MD 10/19/16 [phone]

## 2016-10-19 NOTE — ED Notes (Signed)
Ambulated pt around ER monitoring O2 saturation. Pt stayed between 94% and 96%. Pt sat back on bed in room and saturation dropped to 90%. Dr and RN notified. Gave pt sandwich tray and water per Dr.

## 2016-10-24 DIAGNOSIS — E538 Deficiency of other specified B group vitamins: Secondary | ICD-10-CM | POA: Diagnosis not present

## 2016-10-24 DIAGNOSIS — J9801 Acute bronchospasm: Secondary | ICD-10-CM | POA: Diagnosis not present

## 2016-10-24 DIAGNOSIS — J42 Unspecified chronic bronchitis: Secondary | ICD-10-CM | POA: Diagnosis not present

## 2016-10-24 DIAGNOSIS — I2510 Atherosclerotic heart disease of native coronary artery without angina pectoris: Secondary | ICD-10-CM | POA: Diagnosis not present

## 2016-10-30 ENCOUNTER — Other Ambulatory Visit: Payer: PPO

## 2016-10-30 ENCOUNTER — Other Ambulatory Visit: Payer: Self-pay

## 2016-10-30 DIAGNOSIS — R972 Elevated prostate specific antigen [PSA]: Secondary | ICD-10-CM | POA: Diagnosis not present

## 2016-10-31 LAB — PSA: Prostate Specific Ag, Serum: 5.4 ng/mL — ABNORMAL HIGH (ref 0.0–4.0)

## 2016-11-06 ENCOUNTER — Ambulatory Visit: Payer: PPO | Admitting: Urology

## 2016-11-09 DIAGNOSIS — Z961 Presence of intraocular lens: Secondary | ICD-10-CM | POA: Diagnosis not present

## 2016-11-12 NOTE — Progress Notes (Signed)
3:50 PM   Ruben Green 04-11-1934 [id number]  Referring provider: No referring provider defined for this encounter.  Chief Complaint  Patient presents with  . Benign Prostatic Hypertrophy    1 year follow up   . Elevated PSA    HPI: Mr. Ruben Green is a 83 year old [place] male with a history of elevated PSA, erectile dysfunction and BPH with LUTS who presents today for 12 month recheck.   Elevated PSA Patient's PSA has ranged from 5-8.  He has no family history of prostate cancer. His DRE's have been benign for the exception of enlargement.  He stopped his finasteride and his PSA rose from 3.5 to 6.2.  His most recent PSA was found to be 5.4 ng/mL on 10/30/2016.  Erectile dysfunction Patient refused to fill out a SH IM score sheet today.  He states that his wife is not interested in sexual activity and he is too old to concern himself with this issue.  BPH with LUTS: His IPSS score today is 2, which is mild lower urinary tract symptomatology. He is mostly satisfied  with his quality life due to his urinary symptoms. His previous IPSS score was 6/2.  Marland Kitchen  His previous PVR is 131 mL.   He denies any dysuria, hematuria or suprapubic pain.  He currently taking tamsulosin and restart the finasteride.  His has had photo vaporization of the prostate on 12/26/2011 with Dr. Bernardo Heater.  He also denies any recent fevers, chills, nausea or vomiting.  He does not have a family history of PCa.      IPSS    Row Name 11/13/16 1500         International Prostate Symptom Score   How often have you had the sensation of not emptying your bladder? Not at All     How often have you had to urinate less than every two hours? Not at All     How often have you found you stopped and started again several times when you urinated? Not at All     How often have you found it difficult to postpone urination? Not at All     How often have you had a weak urinary stream? Not at All     How often have you  had to strain to start urination? Not at All     How many times did you typically get up at night to urinate? 2 Times     Total IPSS Score 2       Quality of Life due to urinary symptoms   If you were to spend the rest of your life with your urinary condition just the way it is now how would you feel about that? Mostly Satisfied        Score:  1-7 Mild 8-19 Moderate 20-35 Severe   PMH: Past Medical History:  Diagnosis Date  . Anxiety   . BPH with obstruction/lower urinary tract symptoms   . Chronic bronchitis ([place])   . COPD (chronic obstructive pulmonary disease) ([place])   . Elevated PSA   . Environmental and seasonal allergies   . HLD (hyperlipidemia)   . HOH (hard of hearing)   . HTN (hypertension)   . Incomplete bladder emptying     Surgical History: Past Surgical History:  Procedure Laterality Date  . ABDOMINAL AORTIC ANEURYSM REPAIR    . CATARACT EXTRACTION W/PHACO Right 09/25/2016   Procedure: CATARACT EXTRACTION PHACO AND INTRAOCULAR LENS PLACEMENT (IOC);  Surgeon: Gwyndolyn Saxon  Porfilio, MD;  Location: [facility] ORS;  Service: Ophthalmology;  Laterality: Right;  [place] 01:01 AP% 17.4 CDE 10.75 Fluid pack lot # [id number]  . CATARACT EXTRACTION W/PHACO Left 10/16/2016   Procedure: CATARACT EXTRACTION PHACO AND INTRAOCULAR LENS PLACEMENT ([place]) Suture placed in Left eye;  Surgeon: Birder Robson, MD;  Location: [facility] ORS;  Service: Ophthalmology;  Laterality: Left;  [place] 2:06.8 AP% 22.2 CDE 28.17 Fluid pack lot # [id number]  . HERNIA REPAIR     x 2  . INGUINAL HERNIA REPAIR Right 12/02/2015   Procedure: HERNIA REPAIR INGUINAL ADULT;  Surgeon: Leonie Green, MD;  Location: [facility] ORS;  Service: General;  Laterality: Right;  . PROSTATE SURGERY  2012  . Right Carotid Endarterectomy      Home Medications:  Allergies as of 11/13/2016   No Known Allergies     Medication List       Accurate as of 11/13/16  3:50 PM. Always use your most recent med list.          albuterol 108  (90 Base) MCG/ACT inhaler Commonly known as:  PROVENTIL HFA;VENTOLIN HFA Inhale 2 puffs into the lungs every 6 (six) hours as needed for wheezing or shortness of breath.   albuterol-ipratropium 18-103 MCG/ACT inhaler Commonly known as:  COMBIVENT Inhale 1-2 puffs into the lungs every 4 (four) hours.   amLODipine 10 MG tablet Commonly known as:  NORVASC Take by mouth.   aspirin 81 MG tablet Take 81 mg by mouth daily.   atorvastatin 40 MG tablet Commonly known as:  LIPITOR Take 40 mg by mouth daily.   finasteride 5 MG tablet Commonly known as:  PROSCAR Take 1 tablet (5 mg total) by mouth daily.   furosemide 20 MG tablet Commonly known as:  LASIX Take 20 mg by mouth daily.   hydrALAZINE 25 MG tablet Commonly known as:  APRESOLINE Take 25 mg by mouth 2 (two) times daily.   LORazepam 0.5 MG tablet Commonly known as:  ATIVAN Take 0.5 mg by mouth every 8 (eight) hours.   losartan 100 MG tablet Commonly known as:  COZAAR Take 100 mg by mouth daily.   losartan 100 MG tablet Commonly known as:  COZAAR Take by mouth.   sertraline 50 MG tablet Commonly known as:  ZOLOFT Take by mouth.   tamsulosin 0.4 MG Caps capsule Commonly known as:  FLOMAX Take 1 capsule (0.4 mg total) by mouth daily.       Allergies: No Known Allergies  Family History: Family History  Problem Relation Age of Onset  . Prostate cancer Neg Hx   . Bladder Cancer Neg Hx   . Kidney cancer Neg Hx     Social History:  reports that he quit smoking about 17 years ago. He has quit using smokeless tobacco. He reports that he drinks alcohol. He reports that he does not use drugs.  ROS:  UROLOGY Frequent Urination?: No Hard to postpone urination?: No Burning/pain with urination?: No Get up at night to urinate?: No Leakage of urine?: No Urine stream starts and stops?: No Trouble starting stream?: No Do you have to strain to urinate?: No Blood in urine?: No Urinary tract infection?: No Sexually  transmitted disease?: No Injury to kidneys or bladder?: No Painful intercourse?: No Weak stream?: No Erection problems?: No Penile pain?: No  Gastrointestinal Nausea?: No Vomiting?: No Indigestion/heartburn?: No Diarrhea?: No Constipation?: No  Constitutional Fever: No Night sweats?: No Weight loss?: No Fatigue?: No  Skin Skin rash/lesions?: No Itching?: No  Eyes Blurred vision?: Yes Double vision?: No  Ears/Nose/Throat Sore throat?: No Sinus problems?: No  Hematologic/Lymphatic Swollen glands?: No Easy bruising?: No  Cardiovascular Leg swelling?: No Chest pain?: No  Respiratory Cough?: No Shortness of breath?: No  Endocrine Excessive thirst?: No  Musculoskeletal Back pain?: No Joint pain?: No  Neurological Headaches?: No Dizziness?: No  Psychologic Depression?: No Anxiety?: No  Physical Exam: BP (!) 153/83   Pulse 95   Ht 5\' 4"  (1.626 m)   Wt 168 lb 9.6 oz (76.5 kg)   BMI 28.94 kg/m   GU: Patient with uncircumcised phallus.  Urethral meatus is patent.  No penile discharge. No penile lesions or rashes. Scrotum without lesions, cysts, rashes and/or edema.  Right hernia. Testicles are located scrotally bilaterally. No masses are appreciated in the testicles. Left and right epididymis are normal. Rectal: Patient with  normal sphincter tone. Perineum without scarring or rashes. No rectal masses are appreciated. Prostate is approximately 55 grams, irregular,  no nodules are appreciated. Seminal vesicles are normal.   Laboratory Data: Lab Results  Component Value Date   WBC 7.9 10/19/2016   HGB 13.1 10/19/2016   HCT 39.8 (L) 10/19/2016   MCV 91.7 10/19/2016   PLT 173 10/19/2016   Lab Results  Component Value Date   CREATININE 1.01 10/19/2016   Previous PSA's:     5.67 ng/mL on 09/15/2013     6.9 ng/mL on 05/11/2014     7.3 ng/mL on 08/10/2014     3.5 ng/mL on 02/03/2015     6.2 ng/mL on 08/02/2015     4.4 ng/mL on 11/08/2015        Pertinent Imaging: Results for orders placed or performed in visit on 10/30/16  PSA  Result Value Ref Range   Prostate Specific Ag, Serum 5.4 (H) 0.0 - 4.0 ng/mL    Assessment & Plan:    1. Elevated PSA:   Patient has undergone PSA screening for many years. It has been elevated on occasion. At this time, he would like to continue screening so we will increase the threshold for prostate biopsy to 10 ng/mL.  His PSA has reached 5.4, corrected value is 10.8.  He is not wanting to undergo a biopsy at this time.  We will continue to monitor.  Patient will RTC in 3 months for PSA only.  Patient is agreeable to this plan.  2. BPH with LUTS:   Patient's IPSS score is 2/2.  His DRE demonstrates enlargement, no nodules.  He will continue the tamsulosin and the finasteride. He will follow up in 12 months for a PSA, IPSS and PVR.    Return in about 3 months (around 02/13/2017) for PSA only.  Zara Council, [facility] [address] [place], [place] [address] [phone]

## 2016-11-13 ENCOUNTER — Ambulatory Visit (INDEPENDENT_AMBULATORY_CARE_PROVIDER_SITE_OTHER): Payer: PPO | Admitting: Urology

## 2016-11-13 ENCOUNTER — Encounter: Payer: Self-pay | Admitting: Urology

## 2016-11-13 VITALS — BP 153/83 | HR 95 | Ht 64.0 in | Wt 168.6 lb

## 2016-11-13 DIAGNOSIS — N401 Enlarged prostate with lower urinary tract symptoms: Secondary | ICD-10-CM

## 2016-11-13 DIAGNOSIS — N138 Other obstructive and reflux uropathy: Secondary | ICD-10-CM

## 2016-11-13 DIAGNOSIS — R972 Elevated prostate specific antigen [PSA]: Secondary | ICD-10-CM

## 2016-11-13 MED ORDER — FINASTERIDE 5 MG PO TABS: 5.0000 mg | ORAL_TABLET | Freq: Every day | ORAL | 3 refills | Status: DC

## 2016-11-13 MED ORDER — TAMSULOSIN HCL 0.4 MG PO CAPS
0.4000 mg | ORAL_CAPSULE | Freq: Every day | ORAL | 11 refills | Status: DC
Start: 2016-11-13 — End: 2021-08-04

## 2017-02-19 DIAGNOSIS — H353212 Exudative age-related macular degeneration, right eye, with inactive choroidal neovascularization: Secondary | ICD-10-CM | POA: Diagnosis not present

## 2017-02-20 ENCOUNTER — Other Ambulatory Visit: Payer: PPO

## 2017-02-20 ENCOUNTER — Other Ambulatory Visit: Payer: Self-pay

## 2017-02-20 DIAGNOSIS — R972 Elevated prostate specific antigen [PSA]: Secondary | ICD-10-CM

## 2017-02-21 ENCOUNTER — Telehealth: Payer: Self-pay

## 2017-02-21 LAB — PSA: Prostate Specific Ag, Serum: 8.3 ng/mL — ABNORMAL HIGH (ref 0.0–4.0)

## 2017-02-21 NOTE — Telephone Encounter (Signed)
-----   Message from Nori Riis, PA-C sent at 02/21/2017  7:46 AM EDT ----- Please let Mr. Luppino know that his PSA has risen.  Is is taking the finasteride?

## 2017-02-21 NOTE — Telephone Encounter (Signed)
LMOM

## 2017-02-21 NOTE — Telephone Encounter (Signed)
Spoke wit pt wife who stated pt is taking finasteride daily.

## 2017-02-22 DIAGNOSIS — Z961 Presence of intraocular lens: Secondary | ICD-10-CM | POA: Diagnosis not present

## 2017-02-22 DIAGNOSIS — H353120 Nonexudative age-related macular degeneration, left eye, stage unspecified: Secondary | ICD-10-CM | POA: Diagnosis not present

## 2017-02-22 DIAGNOSIS — H44001 Unspecified purulent endophthalmitis, right eye: Secondary | ICD-10-CM | POA: Diagnosis not present

## 2017-02-22 DIAGNOSIS — H353210 Exudative age-related macular degeneration, right eye, stage unspecified: Secondary | ICD-10-CM | POA: Diagnosis not present

## 2017-02-22 DIAGNOSIS — H3530 Unspecified macular degeneration: Secondary | ICD-10-CM | POA: Diagnosis not present

## 2017-02-23 DIAGNOSIS — H44001 Unspecified purulent endophthalmitis, right eye: Secondary | ICD-10-CM | POA: Insufficient documentation

## 2017-02-25 DIAGNOSIS — H44001 Unspecified purulent endophthalmitis, right eye: Secondary | ICD-10-CM | POA: Diagnosis not present

## 2017-02-26 DIAGNOSIS — F419 Anxiety disorder, unspecified: Secondary | ICD-10-CM | POA: Diagnosis not present

## 2017-02-26 DIAGNOSIS — E782 Mixed hyperlipidemia: Secondary | ICD-10-CM | POA: Diagnosis not present

## 2017-02-26 DIAGNOSIS — I2510 Atherosclerotic heart disease of native coronary artery without angina pectoris: Secondary | ICD-10-CM | POA: Diagnosis not present

## 2017-02-26 DIAGNOSIS — E538 Deficiency of other specified B group vitamins: Secondary | ICD-10-CM | POA: Diagnosis not present

## 2017-02-26 DIAGNOSIS — I10 Essential (primary) hypertension: Secondary | ICD-10-CM | POA: Diagnosis not present

## 2017-02-26 DIAGNOSIS — Z0000 Encounter for general adult medical examination without abnormal findings: Secondary | ICD-10-CM | POA: Diagnosis not present

## 2017-02-26 DIAGNOSIS — R7303 Prediabetes: Secondary | ICD-10-CM | POA: Diagnosis not present

## 2017-02-28 DIAGNOSIS — H353121 Nonexudative age-related macular degeneration, left eye, early dry stage: Secondary | ICD-10-CM | POA: Diagnosis not present

## 2017-02-28 DIAGNOSIS — H353210 Exudative age-related macular degeneration, right eye, stage unspecified: Secondary | ICD-10-CM | POA: Diagnosis not present

## 2017-02-28 DIAGNOSIS — H44001 Unspecified purulent endophthalmitis, right eye: Secondary | ICD-10-CM | POA: Diagnosis not present

## 2017-03-05 ENCOUNTER — Encounter: Payer: Self-pay | Admitting: Emergency Medicine

## 2017-03-05 ENCOUNTER — Emergency Department
Admission: EM | Admit: 2017-03-05 | Discharge: 2017-03-05 | Disposition: A | Discharge status: Home / Self Care | Payer: PPO | Attending: Emergency Medicine | Admitting: Emergency Medicine

## 2017-03-05 DIAGNOSIS — Z79899 Other long term (current) drug therapy: Secondary | ICD-10-CM | POA: Diagnosis not present

## 2017-03-05 DIAGNOSIS — Y990 Civilian activity done for income or pay: Secondary | ICD-10-CM | POA: Insufficient documentation

## 2017-03-05 DIAGNOSIS — Y929 Unspecified place or not applicable: Secondary | ICD-10-CM | POA: Diagnosis not present

## 2017-03-05 DIAGNOSIS — H44001 Unspecified purulent endophthalmitis, right eye: Secondary | ICD-10-CM | POA: Diagnosis not present

## 2017-03-05 DIAGNOSIS — S61316A Laceration without foreign body of right little finger with damage to nail, initial encounter: Secondary | ICD-10-CM | POA: Diagnosis not present

## 2017-03-05 DIAGNOSIS — Z87891 Personal history of nicotine dependence: Secondary | ICD-10-CM | POA: Insufficient documentation

## 2017-03-05 DIAGNOSIS — Z23 Encounter for immunization: Secondary | ICD-10-CM | POA: Insufficient documentation

## 2017-03-05 DIAGNOSIS — I10 Essential (primary) hypertension: Secondary | ICD-10-CM | POA: Diagnosis not present

## 2017-03-05 DIAGNOSIS — Z7982 Long term (current) use of aspirin: Secondary | ICD-10-CM | POA: Insufficient documentation

## 2017-03-05 DIAGNOSIS — W290XXA Contact with powered kitchen appliance, initial encounter: Secondary | ICD-10-CM | POA: Diagnosis not present

## 2017-03-05 DIAGNOSIS — S61214A Laceration without foreign body of right ring finger without damage to nail, initial encounter: Secondary | ICD-10-CM | POA: Diagnosis not present

## 2017-03-05 DIAGNOSIS — Y93G1 Activity, food preparation and clean up: Secondary | ICD-10-CM | POA: Insufficient documentation

## 2017-03-05 DIAGNOSIS — S61216A Laceration without foreign body of right little finger without damage to nail, initial encounter: Secondary | ICD-10-CM | POA: Diagnosis not present

## 2017-03-05 DIAGNOSIS — J449 Chronic obstructive pulmonary disease, unspecified: Secondary | ICD-10-CM | POA: Insufficient documentation

## 2017-03-05 DIAGNOSIS — S61411A Laceration without foreign body of right hand, initial encounter: Secondary | ICD-10-CM | POA: Diagnosis present

## 2017-03-05 MED ORDER — LIDOCAINE HCL (PF) 1 % IJ SOLN
15.0000 mL | Freq: Once | INTRAMUSCULAR | Status: AC
  Administered 2017-03-05: 15 mL
  Filled 2017-03-05: qty 15

## 2017-03-05 MED ORDER — CEPHALEXIN 500 MG PO CAPS: 1000.0000 mg | ORAL_CAPSULE | Freq: Two times a day (BID) | ORAL | 0 refills | Status: DC

## 2017-03-05 MED ORDER — TETANUS-DIPHTH-ACELL PERTUSSIS 5-2.5-18.5 LF-MCG/0.5 IM SUSP
0.5000 mL | Freq: Once | INTRAMUSCULAR | Status: AC
  Administered 2017-03-05: 0.5 mL via INTRAMUSCULAR
  Filled 2017-03-05: qty 0.5

## 2017-03-05 NOTE — ED Provider Notes (Signed)
[facility] Emergency Department Provider Note  ____________________________________________  Time seen: Approximately 3:21 PM  I have reviewed the triage vital signs and the nursing notes.   HISTORY  Chief Complaint Laceration    HPI Ruben Green is a 83 y.o. male who presents emergency department complaining of lacerations to the fourth and fifth digit of his right hand. Patient was using a meat slicer at work and accidentally cut his fourth and fifth digits. Nail bed involvement involved with the fifth digit. Patient is able to move digits appropriately. Bleeding controlled prior to arrival. Patient is unsure of his last tetanus shot.No other injury or complaint. No medications prior to arrival.   Past Medical History:  Diagnosis Date  . Anxiety   . BPH with obstruction/lower urinary tract symptoms   . Chronic bronchitis ([place])   . COPD (chronic obstructive pulmonary disease) ([place])   . Elevated PSA   . Environmental and seasonal allergies   . HLD (hyperlipidemia)   . HOH (hard of hearing)   . HTN (hypertension)   . Incomplete bladder emptying     Patient Active Problem List   Diagnosis Date Noted  . Benign fibroma of prostate 11/08/2015  . Chronic obstructive pulmonary disease ([place]) 11/08/2015  . Essential (primary) hypertension 11/08/2015  . Combined fat and carbohydrate induced hyperlipemia 11/08/2015  . Low serum cobalamin 04/05/2015  . Elevated PSA 02/08/2015  . BPH with obstruction/lower urinary tract symptoms 02/08/2015  . Erectile dysfunction of organic origin 02/08/2015  . Anxiety 08/24/2014  . Abdominal aortic aneurysm (AAA) without rupture ([place]) 05/20/2014  . Carotid artery plaque 05/20/2014  . Breathlessness on exertion 05/20/2014  . Anxiety, generalized 04/20/2014  . Benign prostatic hyperplasia with urinary obstruction 10/27/2012    Past Surgical History:  Procedure Laterality Date  . ABDOMINAL AORTIC ANEURYSM REPAIR     . CATARACT EXTRACTION W/PHACO Right 09/25/2016   Procedure: CATARACT EXTRACTION PHACO AND INTRAOCULAR LENS PLACEMENT (IOC);  Surgeon: Birder Robson, MD;  Location: [facility] ORS;  Service: Ophthalmology;  Laterality: Right;  [place] 01:01 AP% 17.4 CDE 10.75 Fluid pack lot # [id number]  . CATARACT EXTRACTION W/PHACO Left 10/16/2016   Procedure: CATARACT EXTRACTION PHACO AND INTRAOCULAR LENS PLACEMENT ([place]) Suture placed in Left eye;  Surgeon: Birder Robson, MD;  Location: [facility] ORS;  Service: Ophthalmology;  Laterality: Left;  [place] 2:06.8 AP% 22.2 CDE 28.17 Fluid pack lot # [id number]  . HERNIA REPAIR     x 2  . INGUINAL HERNIA REPAIR Right 12/02/2015   Procedure: HERNIA REPAIR INGUINAL ADULT;  Surgeon: Leonie Green, MD;  Location: [facility] ORS;  Service: General;  Laterality: Right;  . PROSTATE SURGERY  2012  . Right Carotid Endarterectomy      Prior to Admission medications   Medication Sig Start Date End Date Taking? Authorizing Provider  albuterol (PROVENTIL HFA;VENTOLIN HFA) 108 (90 Base) MCG/ACT inhaler Inhale 2 puffs into the lungs every 6 (six) hours as needed for wheezing or shortness of breath. 10/19/16   Merlyn Lot, MD  albuterol-ipratropium (COMBIVENT) [phone] MCG/ACT inhaler Inhale 1-2 puffs into the lungs every 4 (four) hours.    [provider]  amLODipine (NORVASC) 10 MG tablet Take by mouth. 09/04/16   [provider]  aspirin 81 MG tablet Take 81 mg by mouth daily.    [provider]  atorvastatin (LIPITOR) 40 MG tablet Take 40 mg by mouth daily.    [provider]  cephALEXin (KEFLEX) 500 MG capsule Take 2 capsules (1,000 mg  total) by mouth 2 (two) times daily. 03/05/17   Cuthriell, Charline Bills, PA-C  finasteride (PROSCAR) 5 MG tablet Take 1 tablet (5 mg total) by mouth daily. 11/13/16   Zara Council A, PA-C  furosemide (LASIX) 20 MG tablet Take 20 mg by mouth daily.  04/20/14   [provider]  hydrALAZINE (APRESOLINE) 25 MG tablet  Take 25 mg by mouth 2 (two) times daily.    [provider]  LORazepam (ATIVAN) 0.5 MG tablet Take 0.5 mg by mouth every 8 (eight) hours.    [provider]  losartan (COZAAR) 100 MG tablet Take 100 mg by mouth daily.  08/24/14 10/19/16  [provider]  losartan (COZAAR) 100 MG tablet Take by mouth. 09/04/16   [provider]  sertraline (ZOLOFT) 50 MG tablet Take by mouth. 10/24/16 10/24/17  [provider]  tamsulosin (FLOMAX) 0.4 MG CAPS capsule Take 1 capsule (0.4 mg total) by mouth daily. 11/13/16   Zara Council A, PA-C    Allergies Patient has no known allergies.  Family History  Problem Relation Age of Onset  . Prostate cancer Neg Hx   . Bladder Cancer Neg Hx   . Kidney cancer Neg Hx     Social History Social History  Substance Use Topics  . Smoking status: Former Smoker    Quit date: 11/30/1998  . Smokeless tobacco: Former Systems developer     Comment: quit 10 years ago  . Alcohol use 0.0 oz/week     Comment: occasional     Review of Systems  Constitutional: No fever/chills Cardiovascular: no chest pain. Respiratory: no cough. No SOB. Musculoskeletal: Negative for musculoskeletal pain. Skin:Positive for lacerations to the fourth and fifth digit with nail involvement to the fifth digit. Neurological: Negative for headaches, focal weakness or numbness. 10-point ROS otherwise negative.  ____________________________________________   PHYSICAL EXAM:  VITAL SIGNS: ED Triage Vitals  Enc Vitals Group     BP 03/05/17 1454 (!) 141/80     Pulse Rate 03/05/17 1454 83     Resp 03/05/17 1454 18     Temp 03/05/17 1454 97.8 F (36.6 C)     Temp Source 03/05/17 1454 Oral     SpO2 03/05/17 1454 94 %     Weight 03/05/17 1454 170 lb (77.1 kg)     Height 03/05/17 1454 5\' 3"  (1.6 m)     Head Circumference --      Peak Flow --      Pain Score 03/05/17 1453 2     Pain Loc --      Pain Edu? --      Excl. in [place]? --      Constitutional: Alert  and oriented. Well appearing and in no acute distress. Eyes: Conjunctivae are normal. PERRL. EOMI. Head: Atraumatic. Neck: No stridor.    Cardiovascular: Normal rate, regular rhythm. Normal S1 and S2.  Good peripheral circulation. Respiratory: Normal respiratory effort without tachypnea or retractions. Lungs CTAB. Good air entry to the bases with no decreased or absent breath sounds. Musculoskeletal: Full range of motion to all extremities. No gross deformities appreciated. Neurologic:  Normal speech and language. No gross focal neurologic deficits are appreciated.  Skin:  Skin is warm, dry and intact. No rash noted. Laceration noted to the fourth digit. Avulsion type laceration to the distal finger pad. No bleeding. No foreign body. The entire laceration is visualized with no ligament or bony involvement. Full range of motion to the fourth digit. Sensation and cap refill  intact. Laceration to the fifth digit is an avulsion which involves the very distal aspect of the nail. No proximal nailbed involvement. Nail sliver, distally is intact with no separation from nailbed. Entire laceration visualized with no bony or ligamentous involvement. Full range of motion to the digit. Cap refill and sensation intact. Psychiatric: Mood and affect are normal. Speech and behavior are normal. Patient exhibits appropriate insight and judgement.   ____________________________________________   LABS (all labs ordered are listed, but only abnormal results are displayed)  Labs Reviewed - No data to display ____________________________________________  EKG   ____________________________________________  RADIOLOGY   No results found.  ____________________________________________    PROCEDURES  Procedure(s) performed:    Marland KitchenMarland KitchenLaceration Repair Date/Time: 03/05/2017 4:42 PM Performed by: Betha Loa D Authorized by: Betha Loa D   Consent:    Consent obtained:  Verbal   Consent  given by:  Patient   Risks discussed:  Pain Anesthesia (see MAR for exact dosages):    Anesthesia method:  Nerve block   Block location:  4th digit   Block needle gauge:  27 G   Block anesthetic:  Lidocaine 1% w/o epi   Block technique:  Digital   Block injection procedure:  Anatomic landmarks palpated, introduced needle, negative aspiration for blood and incremental injection   Block outcome:  Anesthesia achieved Laceration details:    Location:  Finger   Finger location:  R ring finger   Length (cm):  1.5 Repair type:    Repair type:  Simple Pre-procedure details:    Preparation:  Patient was prepped and draped in usual sterile fashion Exploration:    Hemostasis achieved with:  Direct pressure   Wound exploration: wound explored through full range of motion and entire depth of wound probed and visualized     Wound extent: no foreign bodies/material noted, no muscle damage noted, no nerve damage noted, no tendon damage noted and no underlying fracture noted     Contaminated: no   Treatment:    Area cleansed with:  Betadine   Amount of cleaning:  Extensive   Irrigation solution:  Sterile saline   Irrigation method:  Pressure wash and syringe Skin repair:    Repair method:  Sutures   Suture size:  4-0   Suture material:  Nylon   Suture technique:  Simple interrupted   Number of sutures:  8 Approximation:    Approximation:  Close Post-procedure details:    Dressing:  Tube gauze and splint for protection   Patient tolerance of procedure:  Tolerated well, no immediate complications  .Marland KitchenLaceration Repair Date/Time: 03/05/2017 4:54 PM Performed by: Betha Loa D Authorized by: Betha Loa D   Consent:    Consent obtained:  Verbal   Consent given by:  Patient   Risks discussed:  Pain Anesthesia (see MAR for exact dosages):    Anesthesia method:  Nerve block   Block location:  5th digit   Block needle gauge:  27 G   Block anesthetic:  Lidocaine 1% w/o epi    Block technique:  Digital block   Block injection procedure:  Anatomic landmarks identified, introduced needle, negative aspiration for blood and incremental injection   Block outcome:  Anesthesia achieved Laceration details:    Location:  Finger   Finger location:  R small finger   Length (cm):  2 Repair type:    Repair type:  Simple Pre-procedure details:    Preparation:  Patient was prepped and draped in usual sterile fashion Exploration:  Hemostasis achieved with:  Direct pressure   Wound extent: no foreign bodies/material noted, no muscle damage noted, no nerve damage noted, no tendon damage noted and no underlying fracture noted     Contaminated: no   Treatment:    Area cleansed with:  Betadine   Amount of cleaning:  Extensive   Irrigation solution:  Sterile saline   Irrigation method:  Syringe Skin repair:    Repair method:  Sutures   Suture size:  4-0   Suture material:  Nylon   Suture technique:  Simple interrupted   Number of sutures:  8 Approximation:    Approximation:  Close Post-procedure details:    Dressing:  Tube gauze and splint for protection   Patient tolerance of procedure:  Tolerated well, no immediate complications      Medications  lidocaine (PF) (XYLOCAINE) 1 % injection 15 mL (15 mLs Infiltration Given 03/05/17 1537)  Tdap (BOOSTRIX) injection 0.5 mL (0.5 mLs Intramuscular Given 03/05/17 1535)     ____________________________________________   INITIAL IMPRESSION / ASSESSMENT AND PLAN / ED COURSE  Pertinent labs & imaging results that were available during my care of the patient were reviewed by me and considered in my medical decision making (see chart for details).  Review of the [place] CSRS was performed in accordance of the [place] prior to dispensing any controlled drugs.     Patient's diagnosis is consistent with lacerations to the fourth and fifth digit. These are closed as described above. No cough location.. Patient will be discharged  home with prescriptions for Keflex prophylactically. Patient is to follow up with primary care in 1 week for suture removal or sooner as needed or otherwise directed. Patient is given ED precautions to return to the ED for any worsening or new symptoms.     ____________________________________________  FINAL CLINICAL IMPRESSION(S) / ED DIAGNOSES  Final diagnoses:  Laceration of right ring finger without foreign body without damage to nail, initial encounter  Laceration of right little finger without foreign body with damage to nail, initial encounter      NEW MEDICATIONS STARTED DURING THIS VISIT:  Discharge Medication List as of 03/05/2017  4:26 PM    START taking these medications   Details  cephALEXin (KEFLEX) 500 MG capsule Take 2 capsules (1,000 mg total) by mouth 2 (two) times daily., Starting Tue 03/05/2017, Print            This chart was dictated using voice recognition software/Dragon. Despite best efforts to proofread, errors can occur which can change the meaning. Any change was purely unintentional.    Darletta Moll, PA-C 03/05/17 1658    Nance Pear, MD 03/05/17 [phone]

## 2017-03-05 NOTE — ED Notes (Signed)
See triage note   States he was using a meat slicer  Lacerations noted to right 4th and 5 th fingers   Lacerations mainly at tips of fingers

## 2017-03-05 NOTE — ED Triage Notes (Signed)
Cut 4th and 5th digit on meat slicer today.  5th digit did cut nail. Unsure last Tdap. Some bleeding present.

## 2017-03-06 DIAGNOSIS — I10 Essential (primary) hypertension: Secondary | ICD-10-CM | POA: Diagnosis not present

## 2017-03-06 DIAGNOSIS — I2510 Atherosclerotic heart disease of native coronary artery without angina pectoris: Secondary | ICD-10-CM | POA: Diagnosis not present

## 2017-03-06 DIAGNOSIS — S61411A Laceration without foreign body of right hand, initial encounter: Secondary | ICD-10-CM | POA: Diagnosis not present

## 2017-03-06 DIAGNOSIS — F419 Anxiety disorder, unspecified: Secondary | ICD-10-CM | POA: Diagnosis not present

## 2017-03-06 DIAGNOSIS — D649 Anemia, unspecified: Secondary | ICD-10-CM | POA: Diagnosis not present

## 2017-03-06 DIAGNOSIS — E538 Deficiency of other specified B group vitamins: Secondary | ICD-10-CM | POA: Diagnosis not present

## 2017-03-06 DIAGNOSIS — R739 Hyperglycemia, unspecified: Secondary | ICD-10-CM | POA: Diagnosis not present

## 2017-03-06 DIAGNOSIS — J42 Unspecified chronic bronchitis: Secondary | ICD-10-CM | POA: Diagnosis not present

## 2017-03-12 DIAGNOSIS — H44001 Unspecified purulent endophthalmitis, right eye: Secondary | ICD-10-CM | POA: Diagnosis not present

## 2017-03-15 DIAGNOSIS — I10 Essential (primary) hypertension: Secondary | ICD-10-CM | POA: Diagnosis not present

## 2017-03-15 DIAGNOSIS — Z4802 Encounter for removal of sutures: Secondary | ICD-10-CM | POA: Diagnosis not present

## 2017-03-15 DIAGNOSIS — J42 Unspecified chronic bronchitis: Secondary | ICD-10-CM | POA: Diagnosis not present

## 2017-03-15 DIAGNOSIS — Z23 Encounter for immunization: Secondary | ICD-10-CM | POA: Diagnosis not present

## 2017-03-20 NOTE — Progress Notes (Signed)
10:08 PM   Ruben Green Sep 09, 1933 [id number]  Referring provider: Tracie Harrier, MD [address] [facility] [place], [name] [address]  Chief Complaint  Patient presents with  . Elevated PSA    6 month follow up   . Benign Prostatic Hypertrophy    HPI: Mr. Ruben Green is a 83 year old [place] male with an elevated PSA, erectile dysfunction and BPH with LUTS who presents today for 12 month recheck.   Elevated PSA Patient's PSA has ranged from 5-8.  He has no family history of prostate cancer. His DRE's have been benign for the exception of enlargement.  He stopped his finasteride and his PSA rose from 3.5 to 6.2.  His most recent PSA was found to be 12.7 ng/mL on 03/21/2017.  This is an increase from 8.3 one month ago.     Erectile dysfunction He states that his wife is not interested in sexual activity and he is too old to concern himself with this issue.  BPH with LUTS: His IPSS score today is 2, which is mild lower urinary tract symptomatology. He is mostly satisfied  with his quality life due to his urinary symptoms.  His PVR is 265 mL.  His previous IPSS score was 2/2.  His previous PVR is 131 mL.   He denies any dysuria, hematuria or suprapubic pain.  He currently taking tamsulosin and restart the finasteride.  His has had photo vaporization of the prostate on 12/26/2011 with Dr. Bernardo Green.  He also denies any recent fevers, chills, nausea or vomiting.  He does not have a family history of PCa.      IPSS    Row Name 03/21/17 1500         International Prostate Symptom Score   How often have you had the sensation of not emptying your bladder? Not at All     How often have you had to urinate less than every two hours? Not at All     How often have you found you stopped and started again several times when you urinated? Not at All     How often have you found it difficult to postpone urination? Not at All     How often have you had a weak urinary  stream? Not at All     How often have you had to strain to start urination? Not at All     How many times did you typically get up at night to urinate? 2 Times     Total IPSS Score 2       Quality of Life due to urinary symptoms   If you were to spend the rest of your life with your urinary condition just the way it is now how would you feel about that? Mostly Satisfied        Score:  1-7 Mild 8-19 Moderate 20-35 Severe   PMH: Past Medical History:  Diagnosis Date  . Anxiety   . BPH with obstruction/lower urinary tract symptoms   . Chronic bronchitis ([place])   . COPD (chronic obstructive pulmonary disease) ([place])   . Elevated PSA   . Environmental and seasonal allergies   . HLD (hyperlipidemia)   . HOH (hard of hearing)   . HTN (hypertension)   . Incomplete bladder emptying   . Macular degeneration     Surgical History: Past Surgical History:  Procedure Laterality Date  . ABDOMINAL AORTIC ANEURYSM REPAIR    . CATARACT EXTRACTION W/PHACO Right  09/25/2016   Procedure: CATARACT EXTRACTION PHACO AND INTRAOCULAR LENS PLACEMENT (IOC);  Surgeon: Ruben Robson, MD;  Location: [facility] ORS;  Service: Ophthalmology;  Laterality: Right;  [place] 01:01 AP% 17.4 CDE 10.75 Fluid pack lot # [id number]  . CATARACT EXTRACTION W/PHACO Left 10/16/2016   Procedure: CATARACT EXTRACTION PHACO AND INTRAOCULAR LENS PLACEMENT (Ruben Green) Suture placed in Left eye;  Surgeon: Ruben Robson, MD;  Location: [facility] ORS;  Service: Ophthalmology;  Laterality: Left;  [place] 2:06.8 AP% 22.2 CDE 28.17 Fluid pack lot # [id number]  . HERNIA REPAIR     x 2  . INGUINAL HERNIA REPAIR Right 12/02/2015   Procedure: HERNIA REPAIR INGUINAL ADULT;  Surgeon: Ruben Green, MD;  Location: [facility] ORS;  Service: General;  Laterality: Right;  . PROSTATE SURGERY  2012  . Right Carotid Endarterectomy      Home Medications:  Allergies as of 03/21/2017   No Known Allergies     Medication List       Accurate as of 03/21/17 11:59 PM.  Always use your most recent med list.          albuterol 108 (90 Base) MCG/ACT inhaler Commonly known as:  PROVENTIL HFA;VENTOLIN HFA Inhale 2 puffs into the lungs every 6 (six) hours as needed for wheezing or shortness of breath.   albuterol-ipratropium 18-103 MCG/ACT inhaler Commonly known as:  COMBIVENT Inhale 1-2 puffs into the lungs every 4 (four) hours.   amLODipine 10 MG tablet Commonly known as:  NORVASC Take by mouth.   aspirin 81 MG tablet Take 81 mg by mouth daily.   atorvastatin 40 MG tablet Commonly known as:  LIPITOR Take 40 mg by mouth daily.   atropine 1 % ophthalmic solution Administer 1 drop to the right eye daily at 0600.   cefTAZidime 1 g injection Commonly known as:  FORTAZ Inject 2.25 mg into the eye.   cephALEXin 500 MG capsule Commonly known as:  KEFLEX Take 2 capsules (1,000 mg total) by mouth 2 (two) times daily.   Difluprednate 0.05 % Emul Administer 1 drop to the right eye Every two (2) hours.   finasteride 5 MG tablet Commonly known as:  PROSCAR Take 1 tablet (5 mg total) by mouth daily.   furosemide 20 MG tablet Commonly known as:  LASIX Take 20 mg by mouth daily.   hydrALAZINE 25 MG tablet Commonly known as:  APRESOLINE Take 25 mg by mouth 2 (two) times daily.   LORazepam 0.5 MG tablet Commonly known as:  ATIVAN Take 0.5 mg by mouth every 8 (eight) hours.   losartan 100 MG tablet Commonly known as:  COZAAR Take 100 mg by mouth daily.   losartan 100 MG tablet Commonly known as:  COZAAR Take by mouth.   sertraline 50 MG tablet Commonly known as:  ZOLOFT Take by mouth.   tamsulosin 0.4 MG Caps capsule Commonly known as:  FLOMAX Take 1 capsule (0.4 mg total) by mouth daily.   vancomycin 10 mg/mL Inj injection Commonly known as:  VANCOCIN Inject 1 mg into the eye.       Allergies: No Known Allergies  Family History: Family History  Problem Relation Age of Onset  . Prostate cancer Neg Hx   . Bladder Cancer Neg  Hx   . Kidney cancer Neg Hx     Social History:  reports that he quit smoking about 18 years ago. He has quit using smokeless tobacco. He reports that he drinks alcohol. He reports that he does not use drugs.  ROS:  UROLOGY Frequent Urination?: No Hard to postpone urination?: No Burning/pain with urination?: No Get up at night to urinate?: Yes Leakage of urine?: No Urine stream starts and stops?: Yes Trouble starting stream?: No Do you have to strain to urinate?: No Blood in urine?: No Urinary tract infection?: No Sexually transmitted disease?: No Injury to kidneys or bladder?: No Painful intercourse?: No Weak stream?: No Erection problems?: No Penile pain?: No  Gastrointestinal Nausea?: No Vomiting?: No Indigestion/heartburn?: No Diarrhea?: No Constipation?: No  Constitutional Fever: No Night sweats?: No Weight loss?: No Fatigue?: No  Skin Skin rash/lesions?: No Itching?: No  Eyes Blurred vision?: Yes Double vision?: No  Ears/Nose/Throat Sore throat?: No Sinus problems?: No  Hematologic/Lymphatic Swollen glands?: No Easy bruising?: No  Cardiovascular Leg swelling?: No Chest pain?: No  Respiratory Cough?: No Shortness of breath?: Yes  Endocrine Excessive thirst?: No  Musculoskeletal Back pain?: Yes Joint pain?: No  Neurological Headaches?: No Dizziness?: No  Psychologic Depression?: No Anxiety?: Yes  Physical Exam: BP (!) 145/83   Pulse 91   Ht 5\' 1"  (1.549 m)   Wt 173 lb 1.6 oz (78.5 kg)   BMI 32.71 kg/m   GU: Patient with uncircumcised phallus.  Urethral meatus is patent.  No penile discharge. No penile lesions or rashes. Scrotum without lesions, cysts, rashes and/or edema.  Right hernia. Testicles are located scrotally bilaterally. No masses are appreciated in the testicles. Left and right epididymis are normal. Rectal: Patient with  normal sphincter tone. Perineum without scarring or rashes. No rectal masses are appreciated.  Prostate is approximately 55 grams, irregular,  no nodules are appreciated. Seminal vesicles are normal.   Laboratory Data: Lab Results  Component Value Date   WBC 7.9 10/19/2016   HGB 13.1 10/19/2016   HCT 39.8 (L) 10/19/2016   MCV 91.7 10/19/2016   PLT 173 10/19/2016   Lab Results  Component Value Date   CREATININE 1.01 10/19/2016   Previous PSA's:     5.67 ng/mL on 09/15/2013     6.9 ng/mL on 05/11/2014     7.3 ng/mL on 08/10/2014     3.5 ng/mL on 02/03/2015     6.2 ng/mL on 08/02/2015     4.4 ng/mL on 11/08/2015      Results for orders placed or performed in visit on 03/21/17  PSA  Result Value Ref Range   Prostate Specific Ag, Serum 12.7 (H) 0.0 - 4.0 ng/mL  12.7 ng/mL on 03/21/2017     I have reviewed the labs.  Pertinent imaging Results for ANTRONE, WALLA (MRN [id number]) as of 04/01/2017 13:33  Ref. Range 04/01/2017 08:46  Scan Result Unknown 265   Assessment & Plan:    1. Elevated PSA  - current PSA is 12.7  - explained to the patient that his PSA continues to rise while on the medication finasteride which is concerning for prostate cancer  - discussed with patient to cease PSA screening and to monitor symptoms only, undergo an MRI of the prostate or undergo a biopsy at this time  - he has elected to undergo a MRI of the prostate at this time  - RTC for MRI report  2. BPH with LUTS:   Patient's IPSS score is 2/2.  He will continue the tamsulosin and the finasteride. MRI of prostate pending.    Return for MRI report.  Zara Council, [facility] [address], [place], [place] [address] [phone]

## 2017-03-21 ENCOUNTER — Ambulatory Visit (INDEPENDENT_AMBULATORY_CARE_PROVIDER_SITE_OTHER): Payer: PPO | Admitting: Urology

## 2017-03-21 ENCOUNTER — Encounter: Payer: Self-pay | Admitting: Urology

## 2017-03-21 VITALS — BP 145/83 | HR 91 | Ht 61.0 in | Wt 173.1 lb

## 2017-03-21 DIAGNOSIS — N401 Enlarged prostate with lower urinary tract symptoms: Secondary | ICD-10-CM

## 2017-03-21 DIAGNOSIS — R972 Elevated prostate specific antigen [PSA]: Secondary | ICD-10-CM

## 2017-03-21 DIAGNOSIS — N138 Other obstructive and reflux uropathy: Secondary | ICD-10-CM

## 2017-03-22 LAB — PSA: Prostate Specific Ag, Serum: 12.7 ng/mL — ABNORMAL HIGH (ref 0.0–4.0)

## 2017-03-26 ENCOUNTER — Other Ambulatory Visit: Payer: Self-pay | Admitting: Urology

## 2017-03-26 DIAGNOSIS — R972 Elevated prostate specific antigen [PSA]: Secondary | ICD-10-CM

## 2017-03-26 NOTE — Progress Notes (Signed)
Would you please let the [facility] know that I have spoken with Dr. Louis Meckel and we will go ahead with the MRI of the prostate?

## 2017-03-27 ENCOUNTER — Other Ambulatory Visit: Payer: Self-pay

## 2017-03-27 DIAGNOSIS — F419 Anxiety disorder, unspecified: Secondary | ICD-10-CM

## 2017-03-27 DIAGNOSIS — H44001 Unspecified purulent endophthalmitis, right eye: Secondary | ICD-10-CM | POA: Diagnosis not present

## 2017-03-27 MED ORDER — DIAZEPAM 10 MG PO TABS: 10.0000 mg | ORAL_TABLET | Freq: Once | ORAL | 0 refills | Status: AC

## 2017-03-31 ENCOUNTER — Telehealth: Payer: Self-pay | Admitting: Urology

## 2017-03-31 NOTE — Telephone Encounter (Signed)
Would you please check on the status of the patient's MRI of the prostate and we need to put in his bladder scan results of 265 mL in the chart?

## 2017-04-01 LAB — BLADDER SCAN AMB NON-IMAGING: Scan Result: 265

## 2017-04-01 NOTE — Telephone Encounter (Signed)
I asked Sharyn Lull and she said that Scheduling had called and left a message for him on the 12th to return the call. Sharyn Lull called scheduling and they were going to call back today to see if they can get it scheduled. PVR value in.

## 2017-04-09 ENCOUNTER — Ambulatory Visit (HOSPITAL_COMMUNITY)
Admission: RE | Admit: 2017-04-09 | Discharge: 2017-04-09 | Disposition: A | Discharge status: Home / Self Care | Payer: PPO | Source: Ambulatory Visit | Attending: Urology | Admitting: Urology

## 2017-04-09 DIAGNOSIS — R972 Elevated prostate specific antigen [PSA]: Secondary | ICD-10-CM | POA: Diagnosis not present

## 2017-04-09 DIAGNOSIS — N320 Bladder-neck obstruction: Secondary | ICD-10-CM | POA: Diagnosis not present

## 2017-04-09 DIAGNOSIS — N323 Diverticulum of bladder: Secondary | ICD-10-CM | POA: Diagnosis not present

## 2017-04-09 DIAGNOSIS — M1611 Unilateral primary osteoarthritis, right hip: Secondary | ICD-10-CM | POA: Diagnosis not present

## 2017-04-09 LAB — POCT I-STAT CREATININE: Creatinine, Ser: 1.1 mg/dL (ref 0.61–1.24)

## 2017-04-09 MED ORDER — LIDOCAINE HCL 2 % EX GEL: 1.0000 "application " | Freq: Once | CUTANEOUS | Status: DC

## 2017-04-09 MED ORDER — GADOBENATE DIMEGLUMINE 529 MG/ML IV SOLN
20.0000 mL | Freq: Once | INTRAVENOUS | Status: AC | PRN
  Administered 2017-04-09: 16 mL via INTRAVENOUS

## 2017-04-10 MED ORDER — GADOBENATE DIMEGLUMINE 529 MG/ML IV SOLN
20.0000 mL | Freq: Once | INTRAVENOUS | Status: AC | PRN
  Administered 2017-04-09: 16 mL via INTRAVENOUS

## 2017-04-11 NOTE — Telephone Encounter (Signed)
Routing Message to Leodis Sias so she can make patient an appointment.

## 2017-04-11 NOTE — Telephone Encounter (Signed)
-----   Message from Nori Riis, PA-C sent at 04/11/2017 10:09 AM EDT ----- Please have patient come in to talk about his MRI results.

## 2017-04-12 ENCOUNTER — Telehealth: Payer: Self-pay | Admitting: Urology

## 2017-04-12 NOTE — Telephone Encounter (Signed)
Snap his app is 04-18-17 I just typed it wrong lol sorry

## 2017-04-12 NOTE — Telephone Encounter (Signed)
?   That was last year.

## 2017-04-12 NOTE — Telephone Encounter (Signed)
Patient already has appointment set up for 07-19-16  [facility]

## 2017-04-17 NOTE — Progress Notes (Signed)
2:16 PM   Ruben Green 1933/08/08 [id number]  Referring provider: Tracie Harrier, MD [address] [facility] [place], [place] [address]  Chief Complaint  Patient presents with  . Results    Prostate MRI    HPI: Mr. Ruben Green is a 83 year old [place] male with an elevated PSA, erectile dysfunction and BPH with LUTS who presents today to discuss his MRI report.    Elevated PSA Patient's PSA has ranged from 5-8.  He has no family history of prostate cancer. His DRE's have been benign for the exception of enlargement.  He stopped his finasteride and his PSA rose from 3.5 to 6.2.  His most recent PSA was found to be 12.7 ng/mL on 03/21/2017.  This is an increase from 8.3 one month ago.   Patient underwent a MRI for further evaluation.  MRI of the prostate completed on 04/09/2017 noted multiparametric signal abnormality within the lateral right peripheral zone, highly suspicious for macroscopic, higher grade carcinoma. PI-RADSv2 -5.  No evidence of locally advanced or pelvic metastatic disease.   Bladder outlet obstruction, as evidenced by a bladder diverticulum and mild wall irregularity.  Fluid in the right hip joint may be degenerative. Cannot exclude avascular necrosis. Correlate with symptoms and possibly dedicated CT.  Erectile dysfunction He states that his wife is not interested in sexual activity and he is too old to concern himself with this issue.  BPH with LUTS: His IPSS score is 2, which is mild lower urinary tract symptomatology. He is mostly satisfied  with his quality life due to his urinary symptoms.  His PVR is 265 mL.  His previous IPSS score was 2/2.  His previous PVR is 131 mL.   He denies any dysuria, hematuria or suprapubic pain.  He currently taking tamsulosin and restart the finasteride.  His has had photo vaporization of the prostate on 12/26/2011 with Dr. Bernardo Heater.  He also denies any recent fevers, chills, nausea or vomiting.  He does not  have a family history of PCa.      IPSS    Row Name 03/21/17 1500         International Prostate Symptom Score   How often have you had the sensation of not emptying your bladder? Not at All     How often have you had to urinate less than every two hours? Not at All     How often have you found you stopped and started again several times when you urinated? Not at All     How often have you found it difficult to postpone urination? Not at All     How often have you had a weak urinary stream? Not at All     How often have you had to strain to start urination? Not at All     How many times did you typically get up at night to urinate? 2 Times     Total IPSS Score 2       Quality of Life due to urinary symptoms   If you were to spend the rest of your life with your urinary condition just the way it is now how would you feel about that? Mostly Satisfied        Score:  1-7 Mild 8-19 Moderate 20-35 Severe   PMH: Past Medical History:  Diagnosis Date  . Anxiety   . BPH with obstruction/lower urinary tract symptoms   . Chronic bronchitis ([place])   . COPD (chronic  obstructive pulmonary disease) ([place])   . Elevated PSA   . Environmental and seasonal allergies   . HLD (hyperlipidemia)   . HOH (hard of hearing)   . HTN (hypertension)   . Incomplete bladder emptying   . Macular degeneration     Surgical History: Past Surgical History:  Procedure Laterality Date  . ABDOMINAL AORTIC ANEURYSM REPAIR    . CATARACT EXTRACTION W/PHACO Right 09/25/2016   Procedure: CATARACT EXTRACTION PHACO AND INTRAOCULAR LENS PLACEMENT (IOC);  Surgeon: Birder Robson, MD;  Location: [facility] ORS;  Service: Ophthalmology;  Laterality: Right;  [place] 01:01 AP% 17.4 CDE 10.75 Fluid pack lot # [id number]  . CATARACT EXTRACTION W/PHACO Left 10/16/2016   Procedure: CATARACT EXTRACTION PHACO AND INTRAOCULAR LENS PLACEMENT ([place]) Suture placed in Left eye;  Surgeon: Birder Robson, MD;  Location: [facility] ORS;  Service:  Ophthalmology;  Laterality: Left;  [place] 2:06.8 AP% 22.2 CDE 28.17 Fluid pack lot # [id number]  . HERNIA REPAIR     x 2  . INGUINAL HERNIA REPAIR Right 12/02/2015   Procedure: HERNIA REPAIR INGUINAL ADULT;  Surgeon: Leonie Green, MD;  Location: [facility] ORS;  Service: General;  Laterality: Right;  . PROSTATE SURGERY  2012  . Right Carotid Endarterectomy      Home Medications:  Allergies as of 04/18/2017   No Known Allergies     Medication List       Accurate as of 04/18/17  2:16 PM. Always use your most recent med list.          albuterol 108 (90 Base) MCG/ACT inhaler Commonly known as:  PROVENTIL HFA;VENTOLIN HFA Inhale 2 puffs into the lungs every 6 (six) hours as needed for wheezing or shortness of breath.   albuterol-ipratropium 18-103 MCG/ACT inhaler Commonly known as:  COMBIVENT Inhale 1-2 puffs into the lungs every 4 (four) hours.   amLODipine 10 MG tablet Commonly known as:  NORVASC Take 10 mg by mouth daily.   aspirin 81 MG tablet Take 81 mg by mouth daily.   atorvastatin 40 MG tablet Commonly known as:  LIPITOR Take 40 mg by mouth daily.   finasteride 5 MG tablet Commonly known as:  PROSCAR Take 1 tablet (5 mg total) by mouth daily.   furosemide 20 MG tablet Commonly known as:  LASIX Take 20 mg by mouth daily.   hydrALAZINE 25 MG tablet Commonly known as:  APRESOLINE Take 25 mg by mouth 2 (two) times daily.   LORazepam 0.5 MG tablet Commonly known as:  ATIVAN Take 0.5 mg by mouth every 8 (eight) hours.   losartan 100 MG tablet Commonly known as:  COZAAR Take 50 mg by mouth daily.   sertraline 50 MG tablet Commonly known as:  ZOLOFT Take 50 mg by mouth as needed.   tamsulosin 0.4 MG Caps capsule Commonly known as:  FLOMAX Take 1 capsule (0.4 mg total) by mouth daily.       Allergies: No Known Allergies  Family History: Family History  Problem Relation Age of Onset  . Prostate cancer Neg Hx   . Bladder Cancer Neg Hx   . Kidney  cancer Neg Hx     Social History:  reports that he quit smoking about 18 years ago. He has quit using smokeless tobacco. He reports that he drinks alcohol. He reports that he does not use drugs.  ROS:  UROLOGY Frequent Urination?: No Hard to postpone urination?: No Burning/pain with urination?: No Get up at night to urinate?: No Leakage of urine?: No  Urine stream starts and stops?: No Trouble starting stream?: No Do you have to strain to urinate?: No Blood in urine?: No Urinary tract infection?: No Sexually transmitted disease?: No Injury to kidneys or bladder?: No Painful intercourse?: No Weak stream?: No Erection problems?: No Penile pain?: No  Gastrointestinal Nausea?: No Vomiting?: No Indigestion/heartburn?: No Diarrhea?: No Constipation?: No  Constitutional Fever: No Night sweats?: No Weight loss?: No Fatigue?: No  Skin Skin rash/lesions?: No Itching?: No  Eyes Blurred vision?: No Double vision?: No  Ears/Nose/Throat Sore throat?: No Sinus problems?: No  Hematologic/Lymphatic Swollen glands?: No Easy bruising?: No  Cardiovascular Leg swelling?: No Chest pain?: No  Respiratory Cough?: No Shortness of breath?: Yes  Endocrine Excessive thirst?: No  Musculoskeletal Back pain?: No Joint pain?: No  Neurological Headaches?: No Dizziness?: No  Psychologic Depression?: No Anxiety?: No  Physical Exam: BP (!) 160/85   Pulse 81   Temp 97.7 F (36.5 C) (Oral)   Ht 5\' 3"  (1.6 m)   Wt 165 lb 1.6 oz (74.9 kg)   BMI 29.25 kg/m   Constitutional: Well nourished. Alert and oriented, No acute distress. HEENT: [place] AT, moist mucus membranes. Trachea midline, no masses. Cardiovascular: No clubbing, cyanosis, or edema. Respiratory: Normal respiratory effort, no increased work of breathing. Skin: No rashes, bruises or suspicious lesions. Lymph: No cervical or inguinal adenopathy. Neurologic: Grossly intact, no focal deficits, moving all 4  extremities. Psychiatric: Normal mood and affect.  Laboratory Data: Lab Results  Component Value Date   WBC 7.9 10/19/2016   HGB 13.1 10/19/2016   HCT 39.8 (L) 10/19/2016   MCV 91.7 10/19/2016   PLT 173 10/19/2016   Lab Results  Component Value Date   CREATININE 1.10 04/09/2017   Previous PSA's:     5.67 ng/mL on 09/15/2013     6.9 ng/mL on 05/11/2014     7.3 ng/mL on 08/10/2014     3.5 ng/mL on 02/03/2015     6.2 ng/mL on 08/02/2015     4.4 ng/mL on 11/08/2015      Results for orders placed or performed during the hospital encounter of 04/09/17  I-STAT creatinine  Result Value Ref Range   Creatinine, Ser 1.10 0.61 - 1.24 mg/dL  12.7 ng/mL on 03/21/2017     I have reviewed the labs.  Pertinent imaging CLINICAL DATA:  Elevated PSA.  No prior biopsy.  EXAM: MR PROSTATE WITHOUT AND WITH CONTRAST  TECHNIQUE: Multiplanar multisequence MRI images were obtained of the pelvis centered about the prostate. Pre and post contrast images were obtained.  CONTRAST:  16 cc MultiHance  COMPARISON:  None.  FINDINGS: Prostate: Demonstrates moderate central gland enlargement and heterogeneity, consistent with benign prostatic hyperplasia.  Right lateral mid peripheral zone area of relatively well-circumscribed T2 hypointensity, including at maximally 1.9 cm on image 18/series 5, image 11/series 6, and 1.2 cm on image 5/ series 6. This corresponds to restricted diffusion on image 6/ series 1300 and image 6/series 1400. Early post-contrast enhancement, including at up to 1.9 cm on image 49/ series 11702.  Motion degradation, most significant involving the pre and postcontrast dynamic series.  Volume: 3.2 x 5.4 x 4.1 cm (volume = 37 cm^3)  Transcapsular spread:  Absent  Seminal vesicle involvement: Absent. There is T1 hyperintensity within the right seminal vesicle which likely represents hemorrhage on image 11/series 7.  Neurovascular bundle involvement:  Absent  Pelvic adenopathy: Absent  Bone metastasis: Absent  Other findings: Fat containing left inguinal hernia. Extensive colonic diverticulosis. Bladder  wall irregularity with a left-sided bladder diverticulum.  No significant free fluid. Small volume fluid in the right hip joint including on image 70/series 3. Subcapsular signal irregularity within the head on image 64/series 3.  IMPRESSION: 1. Multiparametric signal abnormality within the lateral right peripheral zone, highly suspicious for macroscopic, higher grade carcinoma. PI-RADSv2 -5. 2.  No evidence of locally advanced or pelvic metastatic disease. 3. Bladder outlet obstruction, as evidenced by a bladder diverticulum and mild wall irregularity. 4. Fluid in the right hip joint may be degenerative. Cannot exclude avascular necrosis. Correlate with symptoms and possibly dedicated CT.   Electronically Signed   By: Abigail Miyamoto M.D.   On: 04/09/2017 13:39  I have independently reviewed the films  Assessment & Plan:    1. Elevated PSA  - current PSA is 12.7 - explained to the patient and his wife the findings on the MRI were suspicious for a high-grade prostate cancer within the lateral right peripheral zone, but without prostate biopsy we could not give the diagnosis of prostate cancer at this time - I stated we could continue to monitor the PSA and symptoms but with the possibility of the patient having a high-grade prostate cancer it may impact his quality of life in an aggressive manner if left undiagnosed - patient and his wife would like to undergo biopsy at this time for further evaluation of the concerning area found on prostate MRI   - Patient will be schedule for a TRUSPBx of prostate.  The procedure is explained and the risks involved, such as blood in urine, blood in stool, blood in semen, infection, urinary retention, and on rare occasions sepsis and death.  Patient understands the risks as explained to  him and he wishes to proceed.  Patient is on ASA and has already not been taking the ASA for 2 weeks  - RTC for TRUSPBx of prostate  2. Possible avascular necrosis of the right hip  - patient's wife will call Dr. Ginette Pitman concerning this finding on MRI and she will call his office this afternoon    Return for please schedule TRUSPBx of prostate.  Zara Council, [facility] [address], [place], [place] [address] [phone]

## 2017-04-18 ENCOUNTER — Encounter: Payer: Self-pay | Admitting: Urology

## 2017-04-18 ENCOUNTER — Ambulatory Visit (INDEPENDENT_AMBULATORY_CARE_PROVIDER_SITE_OTHER): Payer: PPO | Admitting: Urology

## 2017-04-18 VITALS — BP 160/85 | HR 81 | Temp 97.7°F | Ht 63.0 in | Wt 165.1 lb

## 2017-04-18 DIAGNOSIS — M8700 Idiopathic aseptic necrosis of unspecified bone: Secondary | ICD-10-CM

## 2017-04-18 DIAGNOSIS — R972 Elevated prostate specific antigen [PSA]: Secondary | ICD-10-CM

## 2017-04-22 DIAGNOSIS — H44001 Unspecified purulent endophthalmitis, right eye: Secondary | ICD-10-CM | POA: Diagnosis not present

## 2017-04-23 DIAGNOSIS — F419 Anxiety disorder, unspecified: Secondary | ICD-10-CM | POA: Diagnosis not present

## 2017-04-23 DIAGNOSIS — J42 Unspecified chronic bronchitis: Secondary | ICD-10-CM | POA: Diagnosis not present

## 2017-04-23 DIAGNOSIS — R262 Difficulty in walking, not elsewhere classified: Secondary | ICD-10-CM | POA: Diagnosis not present

## 2017-04-23 DIAGNOSIS — R1030 Lower abdominal pain, unspecified: Secondary | ICD-10-CM | POA: Diagnosis not present

## 2017-04-23 DIAGNOSIS — I10 Essential (primary) hypertension: Secondary | ICD-10-CM | POA: Diagnosis not present

## 2017-04-23 DIAGNOSIS — R1031 Right lower quadrant pain: Secondary | ICD-10-CM | POA: Diagnosis not present

## 2017-04-23 DIAGNOSIS — E782 Mixed hyperlipidemia: Secondary | ICD-10-CM | POA: Diagnosis not present

## 2017-04-26 DIAGNOSIS — M25551 Pain in right hip: Secondary | ICD-10-CM | POA: Diagnosis not present

## 2017-04-30 ENCOUNTER — Other Ambulatory Visit: Payer: PPO | Admitting: Urology

## 2017-05-06 DIAGNOSIS — H353121 Nonexudative age-related macular degeneration, left eye, early dry stage: Secondary | ICD-10-CM | POA: Diagnosis not present

## 2017-05-07 ENCOUNTER — Other Ambulatory Visit: Payer: PPO | Admitting: Urology

## 2017-05-15 ENCOUNTER — Ambulatory Visit: Payer: PPO | Admitting: Urology

## 2017-05-17 DIAGNOSIS — M25551 Pain in right hip: Secondary | ICD-10-CM | POA: Diagnosis not present

## 2017-05-17 DIAGNOSIS — M6281 Muscle weakness (generalized): Secondary | ICD-10-CM | POA: Diagnosis not present

## 2017-05-17 DIAGNOSIS — M25651 Stiffness of right hip, not elsewhere classified: Secondary | ICD-10-CM | POA: Diagnosis not present

## 2017-05-21 ENCOUNTER — Ambulatory Visit: Payer: PPO | Admitting: Urology

## 2017-05-22 ENCOUNTER — Encounter: Payer: Self-pay | Admitting: Urology

## 2017-05-22 ENCOUNTER — Other Ambulatory Visit: Payer: Self-pay | Admitting: Urology

## 2017-05-22 ENCOUNTER — Ambulatory Visit: Payer: PPO | Admitting: Urology

## 2017-05-22 VITALS — BP 172/83 | HR 90 | Ht 63.0 in | Wt 171.6 lb

## 2017-05-22 DIAGNOSIS — R972 Elevated prostate specific antigen [PSA]: Secondary | ICD-10-CM

## 2017-05-22 DIAGNOSIS — C61 Malignant neoplasm of prostate: Secondary | ICD-10-CM | POA: Diagnosis not present

## 2017-05-22 MED ORDER — LEVOFLOXACIN 500 MG PO TABS
500.0000 mg | ORAL_TABLET | Freq: Once | ORAL | Status: AC
  Administered 2017-05-22: 500 mg via ORAL

## 2017-05-22 MED ORDER — LIDOCAINE HCL 2 % EX GEL
1.0000 "application " | Freq: Once | CUTANEOUS | Status: AC
  Administered 2017-05-22: 1 via URETHRAL

## 2017-05-22 MED ORDER — GENTAMICIN SULFATE 40 MG/ML IJ SOLN
80.0000 mg | Freq: Once | INTRAMUSCULAR | Status: AC
  Administered 2017-05-22: 80 mg via INTRAMUSCULAR

## 2017-05-23 DIAGNOSIS — M25551 Pain in right hip: Secondary | ICD-10-CM | POA: Diagnosis not present

## 2017-05-23 NOTE — Progress Notes (Signed)
Prostate Biopsy Procedure   Indications: Refer to prior note of 04/18/2017.  PSA is rising and prostate MRI remarkable for a PIRADS 5 lesion right peripheral zone.  He has elected to proceed with prostate biopsy.  Informed consent was obtained after discussing risks/benefits of the procedure.  A time out was performed to ensure correct patient identity.  Pre-Procedure: 12.24 March 2017 - Last PSA Level:  - Gentamicin given prophylactically - Levaquin 500 mg administered PO -Transrectal Ultrasound performed revealing a 36 gm prostate -No significant hypoechoic or median lobe noted  Procedure: - Prostate block performed using 10 cc 1% lidocaine and biopsies taken from sextant areas, a total of 12 under ultrasound guidance.  Post-Procedure: - Patient tolerated the procedure well - He was counseled to seek immediate medical attention if experiences any severe pain, significant bleeding, or fevers - Return in one week to discuss biopsy results

## 2017-05-29 ENCOUNTER — Other Ambulatory Visit: Payer: Self-pay | Admitting: Urology

## 2017-05-29 LAB — PATHOLOGY REPORT

## 2017-05-30 DIAGNOSIS — M25551 Pain in right hip: Secondary | ICD-10-CM | POA: Diagnosis not present

## 2017-06-04 DIAGNOSIS — H353212 Exudative age-related macular degeneration, right eye, with inactive choroidal neovascularization: Secondary | ICD-10-CM | POA: Diagnosis not present

## 2017-06-04 DIAGNOSIS — H44001 Unspecified purulent endophthalmitis, right eye: Secondary | ICD-10-CM | POA: Diagnosis not present

## 2017-06-05 ENCOUNTER — Encounter: Payer: Self-pay | Admitting: Urology

## 2017-06-05 ENCOUNTER — Ambulatory Visit (INDEPENDENT_AMBULATORY_CARE_PROVIDER_SITE_OTHER): Payer: PPO | Admitting: Urology

## 2017-06-05 VITALS — BP 128/64 | HR 97 | Ht 63.0 in | Wt 172.0 lb

## 2017-06-05 DIAGNOSIS — C61 Malignant neoplasm of prostate: Secondary | ICD-10-CM | POA: Diagnosis not present

## 2017-06-06 DIAGNOSIS — M25551 Pain in right hip: Secondary | ICD-10-CM | POA: Diagnosis not present

## 2017-06-06 NOTE — Progress Notes (Signed)
06/05/2017 7:27 AM   Ruben Green Ruben Green 1934-02-07 [id number]  Referring provider: Tracie Harrier, MD [address] [facility] [place], [place] [address]  Chief Complaint  Patient presents with  . Follow-up    Biopsy results    HPI: 83 year old male presents for prostate biopsy follow-up.  Biopsy was performed on 05/22/2017 for a PSA of 12.7 and a prostate MRI showing a PIRADS 5 lesion of the right lateral peripheral zone.  He had no postbiopsy problems and has no complaints today.  DRE was benign.  Prostate volume was calculated at 36 Green  Pathology: 9 core biopsies were performed.  The left-sided biopsies showed benign prostate tissue.  All right-sided biopsies were positive for Gleason 4+4 adenocarcinoma involving from 1% - 90% of the submitted tissue.  The core from the right lateral mid prostate showed Gleason 4+5 adenocarcinoma involving 62% of the submitted tissue.   PMH: Past Medical History:  Diagnosis Date  . Anxiety   . BPH with obstruction/lower urinary tract symptoms   . Chronic bronchitis ([place])   . COPD (chronic obstructive pulmonary disease) ([place])   . Elevated PSA   . Environmental and seasonal allergies   . HLD (hyperlipidemia)   . HOH (hard of hearing)   . HTN (hypertension)   . Incomplete bladder emptying   . Macular degeneration     Surgical History: Past Surgical History:  Procedure Laterality Date  . ABDOMINAL AORTIC ANEURYSM REPAIR    . CATARACT EXTRACTION W/PHACO Right 09/25/2016   Procedure: CATARACT EXTRACTION PHACO AND INTRAOCULAR LENS PLACEMENT (IOC);  Surgeon: Birder Robson, MD;  Location: [facility] ORS;  Service: Ophthalmology;  Laterality: Right;  [place] 01:01 AP% 17.4 CDE 10.75 Fluid pack lot # [id number]  . CATARACT EXTRACTION W/PHACO Left 10/16/2016   Procedure: CATARACT EXTRACTION PHACO AND INTRAOCULAR LENS PLACEMENT ([place]) Suture placed in Left eye;  Surgeon: Birder Robson, MD;  Location: [facility] ORS;  Service: Ophthalmology;   Laterality: Left;  [place] 2:06.8 AP% 22.2 CDE 28.17 Fluid pack lot # [id number]  . HERNIA REPAIR     x 2  . INGUINAL HERNIA REPAIR Right 12/02/2015   Procedure: HERNIA REPAIR INGUINAL ADULT;  Surgeon: Leonie Green, MD;  Location: [facility] ORS;  Service: General;  Laterality: Right;  . PROSTATE SURGERY  2012  . Right Carotid Endarterectomy      Home Medications:  Allergies as of 06/05/2017   No Known Allergies     Medication List        Accurate as of 06/05/17 11:59 PM. Always use your most recent med list.          albuterol 108 (90 Base) MCG/ACT inhaler Commonly known as:  PROVENTIL HFA;VENTOLIN HFA Inhale 2 puffs into the lungs every 6 (six) hours as needed for wheezing or shortness of breath.   albuterol-ipratropium 18-103 MCG/ACT inhaler Commonly known as:  COMBIVENT Inhale 1-2 puffs into the lungs every 4 (four) hours.   amLODipine 10 MG tablet Commonly known as:  NORVASC Take 10 mg by mouth daily.   aspirin 81 MG tablet Take 81 mg by mouth daily.   atorvastatin 40 MG tablet Commonly known as:  LIPITOR Take 40 mg by mouth daily.   finasteride 5 MG tablet Commonly known as:  PROSCAR Take 1 tablet (5 mg total) by mouth daily.   furosemide 20 MG tablet Commonly known as:  LASIX Take 20 mg by mouth daily.   hydrALAZINE 25 MG tablet Commonly known as:  APRESOLINE Take 25 mg  by mouth 2 (two) times daily.   LORazepam 0.5 MG tablet Commonly known as:  ATIVAN Take 0.5 mg by mouth every 8 (eight) hours.   losartan 100 MG tablet Commonly known as:  COZAAR Take 50 mg by mouth daily.   sertraline 50 MG tablet Commonly known as:  ZOLOFT Take 50 mg by mouth as needed.   tamsulosin 0.4 MG Caps capsule Commonly known as:  FLOMAX Take 1 capsule (0.4 mg total) by mouth daily.       Allergies: No Known Allergies  Family History: Family History  Problem Relation Age of Onset  . Prostate cancer Neg Hx   . Bladder Cancer Neg Hx   . Kidney cancer Neg Hx      Social History:  reports that he quit smoking about 18 years ago. He has quit using smokeless tobacco. He reports that he drinks alcohol. He reports that he does not use drugs.  ROS: UROLOGY Frequent Urination?: No Hard to postpone urination?: No Burning/pain with urination?: No Get up at night to urinate?: No Leakage of urine?: No Urine stream starts and stops?: No Trouble starting stream?: No Do you have to strain to urinate?: No Blood in urine?: No Urinary tract infection?: No Sexually transmitted disease?: No Injury to kidneys or bladder?: No Painful intercourse?: No Weak stream?: No Erection problems?: No Penile pain?: No  Gastrointestinal Nausea?: No Vomiting?: No Indigestion/heartburn?: No Diarrhea?: No Constipation?: No  Constitutional Fever: No Night sweats?: No Weight loss?: No Fatigue?: No  Skin Skin rash/lesions?: No Itching?: No  Eyes Blurred vision?: No Double vision?: No  Ears/Nose/Throat Sore throat?: No Sinus problems?: No  Hematologic/Lymphatic Swollen glands?: No Easy bruising?: No  Cardiovascular Leg swelling?: No Chest pain?: No  Respiratory Cough?: Yes Shortness of breath?: Yes  Endocrine Excessive thirst?: No  Musculoskeletal Back pain?: No Joint pain?: No  Neurological Headaches?: No Dizziness?: No  Psychologic Depression?: No Anxiety?: Yes  Physical Exam: BP 128/64 (BP Location: Right Arm, Patient Position: Sitting, Cuff Size: Normal)   Pulse 97   Ht 5\' 3"  (1.6 m)   Wt 172 lb (78 kg)   BMI 30.47 kg/m   Constitutional:  Alert and oriented, No acute distress. HEENT: [place] AT, moist mucus membranes.  Trachea midline, no masses. Cardiovascular: No clubbing, cyanosis, or edema. Respiratory: Normal respiratory effort, no increased work of breathing. GI: Abdomen is soft, nontender, nondistended, no abdominal masses GU: No CVA tenderness. Skin: No rashes, bruises or suspicious lesions. Lymph: No cervical or  inguinal adenopathy. Neurologic: Grossly intact, no focal deficits, moving all 4 extremities. Psychiatric: Normal mood and affect.  Laboratory Data: Lab Results  Component Value Date   WBC 7.9 10/19/2016   HGB 13.1 10/19/2016   HCT 39.8 (L) 10/19/2016   MCV 91.7 10/19/2016   PLT 173 10/19/2016    Lab Results  Component Value Date   CREATININE 1.10 04/09/2017    Lab Results  Component Value Date   PSA1 12.7 (H) 03/21/2017   PSA1 8.3 (H) 02/20/2017   PSA1 5.4 (H) 10/30/2016    Assessment & Plan:    1. Prostate cancer [facility]) T1c adenocarcinoma the prostate, high risk.  The pathology report was discussed with Mr. Cremer and his wife.  They also asked that I talk to his son who I called.  Based on his pathology I would recommend treatment.  His prostate MRI showed no radiographic evidence of extracapsular extension and no adenopathy.  A bone scan will be scheduled. His life expectancy is  6.9 years based on his age and prostate cancer diagnosis meaning that curative treatment would not generally be recommended.  I did discuss hormonal therapy.  If his bone scan is negative and they wanted additional information on curative treatment will refer to radiation oncology.  - NM Bone Scan Whole Body; Future    Abbie Sons, MD  [facility] [address], [place], [place] [address] [phone]

## 2017-06-07 DIAGNOSIS — M25551 Pain in right hip: Secondary | ICD-10-CM | POA: Diagnosis not present

## 2017-06-12 ENCOUNTER — Ambulatory Visit: Payer: PPO

## 2017-06-12 VITALS — BP 132/74 | HR 101 | Ht 63.0 in | Wt 172.0 lb

## 2017-06-12 DIAGNOSIS — N390 Urinary tract infection, site not specified: Secondary | ICD-10-CM

## 2017-06-12 LAB — MICROSCOPIC EXAMINATION
Epithelial Cells (non renal): NONE SEEN /hpf (ref 0–10)
WBC, UA: >30 /hpf — ABNORMAL HIGH (ref 0–?)

## 2017-06-12 LAB — URINALYSIS, COMPLETE
Bilirubin, UA: NEGATIVE
Glucose, UA: NEGATIVE
Ketones, UA: NEGATIVE
Nitrite, UA: POSITIVE — AB
Specific Gravity, UA: 1.02 (ref 1.005–1.030)
Urobilinogen, Ur: 0.2 mg/dL (ref 0.2–1.0)
pH, UA: 6 (ref 5.0–7.5)

## 2017-06-12 MED ORDER — CEFUROXIME AXETIL 500 MG PO TABS: 500.0000 mg | ORAL_TABLET | Freq: Two times a day (BID) | ORAL | 0 refills | Status: DC

## 2017-06-12 NOTE — Progress Notes (Signed)
Pt presents today with c/o dysuria. A clean catch was obtained for u/a and cx.  Blood pressure 132/74, pulse (!) 101, height 5\' 3"  (1.6 m), weight 172 lb (78 kg).  Per Dr. Bernardo Heater ceftin 500mg  bid x7days was sent to pt pharmacy.

## 2017-06-14 ENCOUNTER — Other Ambulatory Visit: Payer: Self-pay | Admitting: Sports Medicine

## 2017-06-14 DIAGNOSIS — M25551 Pain in right hip: Secondary | ICD-10-CM

## 2017-06-15 LAB — CULTURE, URINE COMPREHENSIVE

## 2017-06-25 ENCOUNTER — Encounter
Admission: RE | Admit: 2017-06-25 | Discharge: 2017-06-25 | Disposition: A | Discharge status: Home / Self Care | Payer: PPO | Source: Ambulatory Visit | Attending: Urology | Admitting: Urology

## 2017-06-25 DIAGNOSIS — C61 Malignant neoplasm of prostate: Secondary | ICD-10-CM | POA: Diagnosis not present

## 2017-06-25 MED ORDER — TECHNETIUM TC 99M MEDRONATE IV KIT
25.0000 | PACK | Freq: Once | INTRAVENOUS | Status: AC | PRN
  Administered 2017-06-25: 23.54 via INTRAVENOUS

## 2017-06-27 ENCOUNTER — Telehealth: Payer: Self-pay | Admitting: Urology

## 2017-06-27 ENCOUNTER — Ambulatory Visit (INDEPENDENT_AMBULATORY_CARE_PROVIDER_SITE_OTHER): Payer: PPO

## 2017-06-27 VITALS — BP 128/84 | HR 93 | Ht 64.0 in | Wt 172.0 lb

## 2017-06-27 DIAGNOSIS — R300 Dysuria: Secondary | ICD-10-CM

## 2017-06-27 LAB — MICROSCOPIC EXAMINATION
Epithelial Cells (non renal): NONE SEEN /hpf (ref 0–10)
WBC, UA: >30 /hpf — ABNORMAL HIGH (ref 0–?)

## 2017-06-27 LAB — URINALYSIS, COMPLETE
Bilirubin, UA: NEGATIVE
Glucose, UA: NEGATIVE
Ketones, UA: NEGATIVE
Nitrite, UA: POSITIVE — AB
Specific Gravity, UA: 1.02 (ref 1.005–1.030)
Urobilinogen, Ur: 0.2 mg/dL (ref 0.2–1.0)
pH, UA: 8.5 — ABNORMAL HIGH (ref 5.0–7.5)

## 2017-06-27 NOTE — Progress Notes (Signed)
Pt presents today with severe dysuria. Pt was on ceftin for a UTI up until last week. A clean catch was obtained for u/a and cx.   Blood pressure 128/84, pulse 93, height 5\' 4"  (1.626 m), weight 172 lb (78 kg).

## 2017-06-27 NOTE — Telephone Encounter (Signed)
Recc repeat UA/culture

## 2017-06-27 NOTE — Telephone Encounter (Signed)
Left pt mess to call/SW 

## 2017-06-27 NOTE — Telephone Encounter (Signed)
Pt came in today for u/a and cx.

## 2017-06-27 NOTE — Telephone Encounter (Signed)
Patient states that he is still burning when he urinates and wants to know if he can get more abx? He has a follow up for bone scan results on the 15th. Or should he come in sooner for another UA and culture?  Sharyn Lull

## 2017-07-02 ENCOUNTER — Ambulatory Visit: Payer: PPO | Admitting: Urology

## 2017-07-02 ENCOUNTER — Encounter: Payer: Self-pay | Admitting: Urology

## 2017-07-02 VITALS — BP 160/74 | HR 96 | Ht 63.0 in | Wt 173.0 lb

## 2017-07-02 DIAGNOSIS — C61 Malignant neoplasm of prostate: Secondary | ICD-10-CM | POA: Diagnosis not present

## 2017-07-02 MED ORDER — CIPROFLOXACIN HCL 500 MG PO TABS: 500.0000 mg | ORAL_TABLET | Freq: Two times a day (BID) | ORAL | 0 refills | Status: AC

## 2017-07-03 ENCOUNTER — Encounter: Payer: Self-pay | Admitting: Urology

## 2017-07-03 DIAGNOSIS — C61 Malignant neoplasm of prostate: Secondary | ICD-10-CM | POA: Insufficient documentation

## 2017-07-03 LAB — CULTURE, URINE COMPREHENSIVE

## 2017-07-03 NOTE — Progress Notes (Signed)
07/02/2017 8:57 AM   Ruben Green 05/31/1934 [id number]  Referring provider: Tracie Harrier, MD [address] [facility] [place], [place] [address]  Chief Complaint  Patient presents with  . Results    bone scan    HPI: 84 year old male recently diagnosed with high risk Gleason 4+4 adenocarcinoma the prostate.  Prostate MRI showed no evidence of extracapsular extension or adenopathy.  He presents today for follow-up of his bone scan.  He does complain of right hip pain for the past 2 months.  The bone scan did show abnormal activity in the right hip including the femoral head and possibly the acetabulum.  It was felt this could be secondary to avascular necrosis however metastatic disease cannot be excluded.  There are several bilateral foci of rib activity.    He also had a UTI post biopsy and still has dysuria.  Recent urine culture is growing gram-negative rods.  PMH: Past Medical History:  Diagnosis Date  . Anxiety   . BPH with obstruction/lower urinary tract symptoms   . Chronic bronchitis ([place])   . COPD (chronic obstructive pulmonary disease) ([place])   . Elevated PSA   . Environmental and seasonal allergies   . HLD (hyperlipidemia)   . HOH (hard of hearing)   . HTN (hypertension)   . Incomplete bladder emptying   . Macular degeneration     Surgical History: Past Surgical History:  Procedure Laterality Date  . ABDOMINAL AORTIC ANEURYSM REPAIR    . CATARACT EXTRACTION W/PHACO Right 09/25/2016   Procedure: CATARACT EXTRACTION PHACO AND INTRAOCULAR LENS PLACEMENT (IOC);  Surgeon: Birder Robson, MD;  Location: [facility] ORS;  Service: Ophthalmology;  Laterality: Right;  [place] 01:01 AP% 17.4 CDE 10.75 Fluid pack lot # [id number]  . CATARACT EXTRACTION W/PHACO Left 10/16/2016   Procedure: CATARACT EXTRACTION PHACO AND INTRAOCULAR LENS PLACEMENT ([place]) Suture placed in Left eye;  Surgeon: Birder Robson, MD;  Location: [facility] ORS;  Service: Ophthalmology;   Laterality: Left;  [place] 2:06.8 AP% 22.2 CDE 28.17 Fluid pack lot # [id number]  . HERNIA REPAIR     x 2  . INGUINAL HERNIA REPAIR Right 12/02/2015   Procedure: HERNIA REPAIR INGUINAL ADULT;  Surgeon: Leonie Green, MD;  Location: [facility] ORS;  Service: General;  Laterality: Right;  . PROSTATE SURGERY  2012  . Right Carotid Endarterectomy      Home Medications:  Allergies as of 07/02/2017   No Known Allergies     Medication List        Accurate as of 07/02/17 11:59 PM. Always use your most recent med list.          albuterol 108 (90 Base) MCG/ACT inhaler Commonly known as:  PROVENTIL HFA;VENTOLIN HFA Inhale 2 puffs into the lungs every 6 (six) hours as needed for wheezing or shortness of breath.   albuterol-ipratropium 18-103 MCG/ACT inhaler Commonly known as:  COMBIVENT Inhale 1-2 puffs into the lungs every 4 (four) hours.   amLODipine 10 MG tablet Commonly known as:  NORVASC Take 10 mg by mouth daily.   aspirin 81 MG tablet Take 81 mg by mouth daily.   atorvastatin 40 MG tablet Commonly known as:  LIPITOR Take 40 mg by mouth daily.   ciprofloxacin 500 MG tablet Commonly known as:  CIPRO Take 1 tablet (500 mg total) by mouth every 12 (twelve) hours for 7 days.   finasteride 5 MG tablet Commonly known as:  PROSCAR Take 1 tablet (5 mg total) by mouth daily.  furosemide 20 MG tablet Commonly known as:  LASIX Take 20 mg by mouth daily.   hydrALAZINE 25 MG tablet Commonly known as:  APRESOLINE Take 25 mg by mouth 2 (two) times daily.   LORazepam 0.5 MG tablet Commonly known as:  ATIVAN Take 0.5 mg by mouth every 8 (eight) hours.   losartan 100 MG tablet Commonly known as:  COZAAR Take 50 mg by mouth daily.   sertraline 50 MG tablet Commonly known as:  ZOLOFT Take 50 mg by mouth as needed.   tamsulosin 0.4 MG Caps capsule Commonly known as:  FLOMAX Take 1 capsule (0.4 mg total) by mouth daily.       Allergies: No Known Allergies  Family  History: Family History  Problem Relation Age of Onset  . Prostate cancer Neg Hx   . Bladder Cancer Neg Hx   . Kidney cancer Neg Hx     Social History:  reports that he quit smoking about 18 years ago. He has quit using smokeless tobacco. He reports that he drinks alcohol. He reports that he does not use drugs.  ROS: UROLOGY Frequent Urination?: No Hard to postpone urination?: No Burning/pain with urination?: Yes Get up at night to urinate?: No Leakage of urine?: No Urine stream starts and stops?: No Trouble starting stream?: No Do you have to strain to urinate?: No Blood in urine?: No Urinary tract infection?: No Sexually transmitted disease?: No Injury to kidneys or bladder?: No Painful intercourse?: No Weak stream?: No Erection problems?: No Penile pain?: No  Gastrointestinal Nausea?: No Vomiting?: No Indigestion/heartburn?: No Diarrhea?: No Constipation?: No  Constitutional Fever: No Night sweats?: No Weight loss?: No Fatigue?: No  Skin Skin rash/lesions?: No Itching?: No  Eyes Blurred vision?: No Double vision?: No  Ears/Nose/Throat Sore throat?: No Sinus problems?: No  Hematologic/Lymphatic Swollen glands?: No Easy bruising?: No  Cardiovascular Leg swelling?: No Chest pain?: No  Respiratory Cough?: No Shortness of breath?: No  Endocrine Excessive thirst?: No  Musculoskeletal Back pain?: No Joint pain?: No  Neurological Headaches?: No Dizziness?: No  Psychologic Depression?: No Anxiety?: No  Physical Exam: BP (!) 160/74   Pulse 96   Ht 5\' 3"  (1.6 m)   Wt 173 lb (78.5 kg)   BMI 30.65 kg/m   Constitutional:  Alert and oriented, No acute distress. HEENT: [place] AT, moist mucus membranes.  Trachea midline, no masses. Cardiovascular: No clubbing, cyanosis, or edema. Respiratory: Normal respiratory effort, no increased work of breathing. GI: Abdomen is soft, nontender, nondistended, no abdominal masses GU: No CVA tenderness.   Skin: No rashes, bruises or suspicious lesions. Lymph: No cervical or inguinal adenopathy. Neurologic: Grossly intact, no focal deficits, moving all 4 extremities. Psychiatric: Normal mood and affect.  Laboratory Data: Lab Results  Component Value Date   WBC 7.9 10/19/2016   HGB 13.1 10/19/2016   HCT 39.8 (L) 10/19/2016   MCV 91.7 10/19/2016   PLT 173 10/19/2016    Lab Results  Component Value Date   CREATININE 1.10 04/09/2017    Lab Results  Component Value Date   PSA1 12.7 (H) 03/21/2017   PSA1 8.3 (H) 02/20/2017   PSA1 5.4 (H) 10/30/2016    Urinalysis Lab Results  Component Value Date   SPECGRAV 1.020 06/27/2017   PHUR 8.5 (H) 06/27/2017   COLORU Yellow 06/27/2017   APPEARANCEUR Cloudy (A) 06/27/2017   LEUKOCYTESUR 3+ (A) 06/27/2017   PROTEINUR 2+ (A) 06/27/2017   GLUCOSEU Negative 06/27/2017   KETONESU Negative 06/27/2017   RBCU  1+ (A) 06/27/2017   BILIRUBINUR Negative 06/27/2017   UUROB 0.2 06/27/2017   NITRITE Positive (A) 06/27/2017    Lab Results  Component Value Date   LABMICR See below: 06/27/2017   WBCUA >30 (H) 06/27/2017   RBCUA 3-10 (A) 06/27/2017   LABEPIT None seen 06/27/2017   MUCUS Present (A) 06/27/2017   BACTERIA Many (A) 06/27/2017     Assessment & Plan:   1. Prostate cancer [facility]) Prostate MRI shows no evidence of extracapsular extension or adenopathy.  He most likely has avascular necrosis of the right hip however metastatic disease cannot be excluded.  Will schedule a right hip CT.  Based on his age and comorbidities the [facility] prostate indicate cancer life expectancy table at 81 years 5% of men would have died of untreated prostate cancer, 13% would still be alive and 82% would have died of other causes.  He would not be a candidate for radical prostatectomy.  I did discuss radiation and they would like additional information however based on age and comorbidities radiation may not be an ideal treatment.  This would also depend on  the outcome of his CT for metastatic disease.    I also discussed hormonal therapy and surveillance.  - Ambulatory referral to Oncology - [place]; Future  2.  Urinary tract infection Rx Cipro was sent to his pharmacy.   No Follow-up on file.  Abbie Sons, [place] [address], [place], [place] [address] [phone]

## 2017-07-04 DIAGNOSIS — R739 Hyperglycemia, unspecified: Secondary | ICD-10-CM | POA: Diagnosis not present

## 2017-07-04 DIAGNOSIS — F419 Anxiety disorder, unspecified: Secondary | ICD-10-CM | POA: Diagnosis not present

## 2017-07-04 DIAGNOSIS — J42 Unspecified chronic bronchitis: Secondary | ICD-10-CM | POA: Diagnosis not present

## 2017-07-04 DIAGNOSIS — S61411A Laceration without foreign body of right hand, initial encounter: Secondary | ICD-10-CM | POA: Diagnosis not present

## 2017-07-04 DIAGNOSIS — I2510 Atherosclerotic heart disease of native coronary artery without angina pectoris: Secondary | ICD-10-CM | POA: Diagnosis not present

## 2017-07-04 DIAGNOSIS — I10 Essential (primary) hypertension: Secondary | ICD-10-CM | POA: Diagnosis not present

## 2017-07-04 DIAGNOSIS — R8290 Unspecified abnormal findings in urine: Secondary | ICD-10-CM | POA: Diagnosis not present

## 2017-07-04 DIAGNOSIS — D649 Anemia, unspecified: Secondary | ICD-10-CM | POA: Diagnosis not present

## 2017-07-04 DIAGNOSIS — E538 Deficiency of other specified B group vitamins: Secondary | ICD-10-CM | POA: Diagnosis not present

## 2017-07-09 ENCOUNTER — Ambulatory Visit
Admission: RE | Admit: 2017-07-09 | Discharge: 2017-07-09 | Disposition: A | Discharge status: Home / Self Care | Payer: PPO | Source: Ambulatory Visit | Attending: Urology | Admitting: Urology

## 2017-07-09 DIAGNOSIS — C61 Malignant neoplasm of prostate: Secondary | ICD-10-CM | POA: Diagnosis not present

## 2017-07-09 DIAGNOSIS — M1611 Unilateral primary osteoarthritis, right hip: Secondary | ICD-10-CM | POA: Insufficient documentation

## 2017-07-09 MED ORDER — IOPAMIDOL (ISOVUE-300) INJECTION 61%
100.0000 mL | Freq: Once | INTRAVENOUS | Status: AC | PRN
  Administered 2017-07-09: 100 mL via INTRAVENOUS

## 2017-07-10 ENCOUNTER — Ambulatory Visit
Admission: RE | Admit: 2017-07-10 | Discharge: 2017-07-10 | Disposition: A | Discharge status: Home / Self Care | Payer: PPO | Source: Ambulatory Visit | Attending: Radiation Oncology | Admitting: Radiation Oncology

## 2017-07-10 ENCOUNTER — Encounter: Payer: Self-pay | Admitting: Radiation Oncology

## 2017-07-10 ENCOUNTER — Telehealth: Payer: Self-pay

## 2017-07-10 ENCOUNTER — Other Ambulatory Visit: Payer: Self-pay

## 2017-07-10 VITALS — BP 129/67 | HR 88 | Temp 97.8°F | Resp 20 | Ht 63.0 in | Wt 170.1 lb

## 2017-07-10 DIAGNOSIS — R351 Nocturia: Secondary | ICD-10-CM | POA: Insufficient documentation

## 2017-07-10 DIAGNOSIS — Z510 Encounter for antineoplastic radiation therapy: Secondary | ICD-10-CM | POA: Diagnosis not present

## 2017-07-10 DIAGNOSIS — F419 Anxiety disorder, unspecified: Secondary | ICD-10-CM | POA: Diagnosis not present

## 2017-07-10 DIAGNOSIS — I10 Essential (primary) hypertension: Secondary | ICD-10-CM | POA: Diagnosis not present

## 2017-07-10 DIAGNOSIS — R972 Elevated prostate specific antigen [PSA]: Secondary | ICD-10-CM | POA: Diagnosis not present

## 2017-07-10 DIAGNOSIS — M1611 Unilateral primary osteoarthritis, right hip: Secondary | ICD-10-CM | POA: Diagnosis not present

## 2017-07-10 DIAGNOSIS — C61 Malignant neoplasm of prostate: Secondary | ICD-10-CM | POA: Insufficient documentation

## 2017-07-10 DIAGNOSIS — Z809 Family history of malignant neoplasm, unspecified: Secondary | ICD-10-CM | POA: Insufficient documentation

## 2017-07-10 DIAGNOSIS — R339 Retention of urine, unspecified: Secondary | ICD-10-CM | POA: Diagnosis not present

## 2017-07-10 DIAGNOSIS — Z79899 Other long term (current) drug therapy: Secondary | ICD-10-CM | POA: Diagnosis not present

## 2017-07-10 DIAGNOSIS — Z7982 Long term (current) use of aspirin: Secondary | ICD-10-CM | POA: Diagnosis not present

## 2017-07-10 DIAGNOSIS — Z87891 Personal history of nicotine dependence: Secondary | ICD-10-CM | POA: Insufficient documentation

## 2017-07-10 DIAGNOSIS — J449 Chronic obstructive pulmonary disease, unspecified: Secondary | ICD-10-CM | POA: Diagnosis not present

## 2017-07-10 DIAGNOSIS — E785 Hyperlipidemia, unspecified: Secondary | ICD-10-CM | POA: Insufficient documentation

## 2017-07-10 NOTE — Telephone Encounter (Signed)
Left pt mess to call 

## 2017-07-10 NOTE — Consult Note (Signed)
NEW PATIENT EVALUATION  Name: Ruben Green  MRN: [id number]  Date:   07/10/2017     DOB: Apr 14, 1934   This 84 y.o. male patient presents to the clinic for initial evaluation of stage IIB (T1 CN 0 M0) Gleason 8 (4+4) adenocarcinoma the prostate presenting with a PSA of 12.  REFERRING PHYSICIAN: Tracie Harrier, MD  CHIEF COMPLAINT:  Chief Complaint  Patient presents with  . Prostate Cancer    DIAGNOSIS: The encounter diagnosis was Malignant neoplasm of prostate ([place]).   PREVIOUS INVESTIGATIONS:  Pathology reports reviewed Bone scan CT scan reviewed Clinical notes reviewed  HPI: Patient is an 83 year old male who is noted to have an elevated PSA going back 8 months when he presented as 5.4. Most recently 3 months prior to climb to 12.7 prompting transrectal ultrasound-guided biopsy showing 6 mm 9 cores positive for Gleason 8 (4+4) adenocarcinoma. Bone scan shows increased uptake in the right hip later confirmed on CT scan to be moderate to severe osteoarthritis of the right hip. MRI scan of his prostate showing multiple parametric signal abnormality within the lateral right peripheral zone suspicious for macroscopic high-grade carcinoma. No evidence of advanced or pelvic metastatic disease does have evidence for bladder outlet obstruction. Patient does have significant right hip pain. He has nocturia 2-3. Treatment options have been discussed with the patient and he is seen today for radiation oncology consultation. Except for his right hip pain he is otherwise doing fairly well.  PLANNED TREATMENT REGIMEN: Image guided I MRT radiation therapy to both prostate and pelvic nodes  PAST MEDICAL HISTORY:  has a past medical history of Anxiety, BPH with obstruction/lower urinary tract symptoms, Chronic bronchitis ([place]), COPD (chronic obstructive pulmonary disease) ([place]), Elevated PSA, Environmental and seasonal allergies, HLD (hyperlipidemia), HOH (hard of hearing), HTN (hypertension),  Incomplete bladder emptying, and Macular degeneration.    PAST SURGICAL HISTORY:  Past Surgical History:  Procedure Laterality Date  . ABDOMINAL AORTIC ANEURYSM REPAIR    . CATARACT EXTRACTION W/PHACO Right 09/25/2016   Procedure: CATARACT EXTRACTION PHACO AND INTRAOCULAR LENS PLACEMENT (IOC);  Surgeon: Birder Robson, MD;  Location: [facility] ORS;  Service: Ophthalmology;  Laterality: Right;  [place] 01:01 AP% 17.4 CDE 10.75 Fluid pack lot # [id number]  . CATARACT EXTRACTION W/PHACO Left 10/16/2016   Procedure: CATARACT EXTRACTION PHACO AND INTRAOCULAR LENS PLACEMENT ([place]) Suture placed in Left eye;  Surgeon: Birder Robson, MD;  Location: [facility] ORS;  Service: Ophthalmology;  Laterality: Left;  [place] 2:06.8 AP% 22.2 CDE 28.17 Fluid pack lot # [id number]  . HERNIA REPAIR     x 2  . INGUINAL HERNIA REPAIR Right 12/02/2015   Procedure: HERNIA REPAIR INGUINAL ADULT;  Surgeon: Leonie Green, MD;  Location: [facility] ORS;  Service: General;  Laterality: Right;  . PROSTATE SURGERY  2012  . Right Carotid Endarterectomy      FAMILY HISTORY: family history includes Cancer in his brother.  SOCIAL HISTORY:  reports that he quit smoking about 18 years ago. He has quit using smokeless tobacco. He reports that he drinks alcohol. He reports that he does not use drugs.  ALLERGIES: Patient has no known allergies.  MEDICATIONS:  Current Outpatient Medications  Medication Sig Dispense Refill  . albuterol (PROVENTIL HFA;VENTOLIN HFA) 108 (90 Base) MCG/ACT inhaler Inhale 2 puffs into the lungs every 6 (six) hours as needed for wheezing or shortness of breath. 1 Inhaler 2  . albuterol-ipratropium (COMBIVENT) 18-103 MCG/ACT inhaler Inhale 1-2 puffs into the lungs every 4 (four) hours.    Marland Kitchen  amLODipine (NORVASC) 10 MG tablet Take 10 mg by mouth daily.     Marland Kitchen aspirin 81 MG tablet Take 81 mg by mouth daily.    Marland Kitchen atorvastatin (LIPITOR) 40 MG tablet Take 40 mg by mouth daily.    . finasteride (PROSCAR) 5 MG tablet Take 1  tablet (5 mg total) by mouth daily. 90 tablet 3  . furosemide (LASIX) 20 MG tablet Take 20 mg by mouth daily.     . hydrALAZINE (APRESOLINE) 25 MG tablet Take 25 mg by mouth 2 (two) times daily.    Marland Kitchen LORazepam (ATIVAN) 0.5 MG tablet Take 0.5 mg by mouth every 8 (eight) hours.    Marland Kitchen losartan (COZAAR) 100 MG tablet Take 50 mg by mouth daily.     . tamsulosin (FLOMAX) 0.4 MG CAPS capsule Take 1 capsule (0.4 mg total) by mouth daily. 30 capsule 11  . sertraline (ZOLOFT) 50 MG tablet Take 50 mg by mouth as needed.      No current facility-administered medications for this encounter.     ECOG PERFORMANCE STATUS:  0 - Asymptomatic  REVIEW OF SYSTEMS:  Patient denies any weight loss, fatigue, weakness, fever, chills or night sweats. Patient denies any loss of vision, blurred vision. Patient denies any ringing  of the ears or hearing loss. No irregular heartbeat. Patient denies heart murmur or history of fainting. Patient denies any chest pain or pain radiating to her upper extremities. Patient denies any shortness of breath, difficulty breathing at night, cough or hemoptysis. Patient denies any swelling in the lower legs. Patient denies any nausea vomiting, vomiting of blood, or coffee ground material in the vomitus. Patient denies any stomach pain. Patient states has had normal bowel movements no significant constipation or diarrhea. Patient denies any dysuria, hematuria or significant nocturia. Patient denies any problems walking, swelling in the joints or loss of balance. Patient denies any skin changes, loss of hair or loss of weight. Patient denies any excessive worrying or anxiety or significant depression. Patient denies any problems with insomnia. Patient denies excessive thirst, polyuria, polydipsia. Patient denies any swollen glands, patient denies easy bruising or easy bleeding. Patient denies any recent infections, allergies or URI. Patient "s visual fields have not changed significantly in recent  time.    PHYSICAL EXAM: BP 129/67 (BP Location: Left Arm, Patient Position: Sitting, Cuff Size: Large)   Pulse 88   Temp 97.8 F (36.6 C)   Resp 20   Ht 5\' 3"  (1.6 m)   Wt 170 lb 1.4 oz (77.2 kg)   BMI 30.13 kg/m  On rectal exam rectal sphincter tone is good prostate is firm although no discrete nodularity or mass are noted sulcus is preserved bilaterally. No other rectal abnormalities identified. Well-developed well-nourished patient in NAD. HEENT reveals PERLA, EOMI, discs not visualized.  Oral cavity is clear. No oral mucosal lesions are identified. Neck is clear without evidence of cervical or supraclavicular adenopathy. Lungs are clear to A&P. Cardiac examination is essentially unremarkable with regular rate and rhythm without murmur rub or thrill. Abdomen is benign with no organomegaly or masses noted. Motor sensory and DTR levels are equal and symmetric in the upper and lower extremities. Cranial nerves II through XII are grossly intact. Proprioception is intact. No peripheral adenopathy or edema is identified. No motor or sensory levels are noted. Crude visual fields are within normal range.  LABORATORY DATA: Pathology reports reviewed    RADIOLOGY RESULTS: CT scans MRI scans and bone scan all reviewed and compatible  with the above-stated findings   IMPRESSION: Stage IIB adenocarcinoma Gleason score of 8 of the prostate presenting the PSA of 12.7.  PLAN: At this time I have recommended going ahead with image guided radiation therapy to his prostate and pelvic nodes. Would plan on delivering 8000 cGy over 8 weeks to his prostate and 5400 cGy to his pelvic lymph nodes. Would use I MRT dose painting technique. I have asked urology to place fiduciary markers in his prostate for daily image guided treatment. I believe patient also benefited from at least a year and a half of a DVT and have been requested for Lupron therapy. Risks and benefits of treatment including increased lower urinary  tract symptoms diarrhea fatigue alteration of blood counts skin reaction all were discussed in detail with the patient. He seems to comprehend my treatment plan well. I personally set up and ordered CT simulation after markers are placed.  I would like to take this opportunity to thank you for allowing me to participate in the care of your patient.Noreene Filbert, MD

## 2017-07-10 NOTE — Progress Notes (Signed)
Appointment and instructions given for Gold seed marker placement.  Instructions reviewed with patient and wife with read back performed.   Gold seed markers sent with patient.

## 2017-07-10 NOTE — Telephone Encounter (Signed)
-----   Message from Abbie Sons, MD sent at 07/10/2017  1:07 PM EST ----- CT showed arthritis of the hip and no evidence of cancer

## 2017-07-11 ENCOUNTER — Other Ambulatory Visit: Payer: Self-pay | Admitting: *Deleted

## 2017-07-11 DIAGNOSIS — I2510 Atherosclerotic heart disease of native coronary artery without angina pectoris: Secondary | ICD-10-CM | POA: Diagnosis not present

## 2017-07-11 DIAGNOSIS — M1611 Unilateral primary osteoarthritis, right hip: Secondary | ICD-10-CM | POA: Diagnosis not present

## 2017-07-11 DIAGNOSIS — I714 Abdominal aortic aneurysm, without rupture: Secondary | ICD-10-CM | POA: Diagnosis not present

## 2017-07-11 DIAGNOSIS — C61 Malignant neoplasm of prostate: Secondary | ICD-10-CM | POA: Diagnosis not present

## 2017-07-11 DIAGNOSIS — I10 Essential (primary) hypertension: Secondary | ICD-10-CM | POA: Diagnosis not present

## 2017-07-11 DIAGNOSIS — E538 Deficiency of other specified B group vitamins: Secondary | ICD-10-CM | POA: Diagnosis not present

## 2017-07-11 DIAGNOSIS — I6523 Occlusion and stenosis of bilateral carotid arteries: Secondary | ICD-10-CM | POA: Diagnosis not present

## 2017-07-11 DIAGNOSIS — F419 Anxiety disorder, unspecified: Secondary | ICD-10-CM | POA: Diagnosis not present

## 2017-07-11 DIAGNOSIS — E782 Mixed hyperlipidemia: Secondary | ICD-10-CM | POA: Diagnosis not present

## 2017-07-11 DIAGNOSIS — D649 Anemia, unspecified: Secondary | ICD-10-CM | POA: Diagnosis not present

## 2017-07-11 DIAGNOSIS — N400 Enlarged prostate without lower urinary tract symptoms: Secondary | ICD-10-CM | POA: Diagnosis not present

## 2017-07-11 DIAGNOSIS — R739 Hyperglycemia, unspecified: Secondary | ICD-10-CM | POA: Diagnosis not present

## 2017-07-11 NOTE — Telephone Encounter (Signed)
Patient made aware  Ruben Green

## 2017-07-15 ENCOUNTER — Ambulatory Visit: Payer: PPO | Admitting: Urology

## 2017-07-15 ENCOUNTER — Encounter: Payer: Self-pay | Admitting: Urology

## 2017-07-15 ENCOUNTER — Other Ambulatory Visit: Payer: Self-pay | Admitting: *Deleted

## 2017-07-15 VITALS — BP 139/71 | HR 93 | Ht 62.0 in | Wt 169.0 lb

## 2017-07-15 DIAGNOSIS — C61 Malignant neoplasm of prostate: Secondary | ICD-10-CM | POA: Diagnosis not present

## 2017-07-15 DIAGNOSIS — N390 Urinary tract infection, site not specified: Secondary | ICD-10-CM | POA: Diagnosis not present

## 2017-07-15 MED ORDER — LEVOFLOXACIN 500 MG PO TABS
500.0000 mg | ORAL_TABLET | Freq: Once | ORAL | Status: AC
  Administered 2017-07-15: 500 mg via ORAL

## 2017-07-15 MED ORDER — GENTAMICIN SULFATE 40 MG/ML IJ SOLN
80.0000 mg | Freq: Once | INTRAMUSCULAR | Status: AC
Start: 2017-07-15 — End: 2017-07-15
  Administered 2017-07-15: 80 mg via INTRAMUSCULAR

## 2017-07-16 LAB — MICROSCOPIC EXAMINATION

## 2017-07-16 LAB — URINALYSIS, COMPLETE
Bilirubin, UA: NEGATIVE
Glucose, UA: NEGATIVE
Ketones, UA: NEGATIVE
Leukocytes, UA: NEGATIVE
Nitrite, UA: NEGATIVE
RBC, UA: NEGATIVE
Specific Gravity, UA: 1.02 (ref 1.005–1.030)
Urobilinogen, Ur: 0.2 mg/dL (ref 0.2–1.0)
pH, UA: 5.5 (ref 5.0–7.5)

## 2017-07-17 ENCOUNTER — Ambulatory Visit
Admission: RE | Admit: 2017-07-17 | Discharge: 2017-07-17 | Disposition: A | Discharge status: Home / Self Care | Payer: PPO | Source: Ambulatory Visit | Attending: Radiation Oncology | Admitting: Radiation Oncology

## 2017-07-17 ENCOUNTER — Inpatient Hospital Stay: Payer: PPO | Attending: Radiation Oncology

## 2017-07-17 DIAGNOSIS — C61 Malignant neoplasm of prostate: Secondary | ICD-10-CM

## 2017-07-17 DIAGNOSIS — Z5111 Encounter for antineoplastic chemotherapy: Secondary | ICD-10-CM | POA: Insufficient documentation

## 2017-07-17 DIAGNOSIS — Z510 Encounter for antineoplastic radiation therapy: Secondary | ICD-10-CM | POA: Diagnosis not present

## 2017-07-17 MED ORDER — LEUPROLIDE ACETATE (4 MONTH) 30 MG IM KIT
30.0000 mg | PACK | Freq: Once | INTRAMUSCULAR | Status: AC
Start: 2017-07-17 — End: 2017-07-17
  Administered 2017-07-17: 30 mg via INTRAMUSCULAR
  Filled 2017-07-17: qty 30

## 2017-07-17 NOTE — Progress Notes (Signed)
07/15/2017  HPI: 84 y.o. male with prostate cancer who presents today for placement of fiducial seed markers in anticipation of his upcoming IMRT with Dr. Baruch Gouty.  Prostate Gold Seed Marker Placement Procedure   Informed consent was obtained after discussing risks/benefits of the procedure.  A time out was performed to ensure correct patient identity.  Pre-Procedure: - Gentamicin given prophylactically - PO Levaquin 500 mg also given today  Procedure: -Rectal ultrasound probe was placed without difficulty and the prostate visualized -Prostate nerve block performed with 10 mL of 1% plain Xylocaine - 3 fiducial gold seed markers placed, one at right base, one at left base, one at apex of prostate gland under transrectal ultrasound guidance  Post-Procedure: - Patient tolerated the procedure well - He was counseled to seek immediate medical attention if experiences any severe pain, significant bleeding, or fevers

## 2017-07-24 DIAGNOSIS — Z510 Encounter for antineoplastic radiation therapy: Secondary | ICD-10-CM | POA: Diagnosis not present

## 2017-07-24 DIAGNOSIS — C61 Malignant neoplasm of prostate: Secondary | ICD-10-CM | POA: Diagnosis not present

## 2017-07-26 ENCOUNTER — Other Ambulatory Visit: Payer: Self-pay | Admitting: *Deleted

## 2017-07-26 DIAGNOSIS — C61 Malignant neoplasm of prostate: Secondary | ICD-10-CM

## 2017-07-29 ENCOUNTER — Ambulatory Visit
Admission: RE | Admit: 2017-07-29 | Discharge: 2017-07-29 | Disposition: A | Discharge status: Home / Self Care | Payer: PPO | Source: Ambulatory Visit | Attending: Radiation Oncology | Admitting: Radiation Oncology

## 2017-07-30 ENCOUNTER — Ambulatory Visit
Admission: RE | Admit: 2017-07-30 | Discharge: 2017-07-30 | Disposition: A | Discharge status: Home / Self Care | Payer: PPO | Source: Ambulatory Visit | Attending: Radiation Oncology | Admitting: Radiation Oncology

## 2017-07-30 DIAGNOSIS — C61 Malignant neoplasm of prostate: Secondary | ICD-10-CM | POA: Diagnosis not present

## 2017-07-30 DIAGNOSIS — Z510 Encounter for antineoplastic radiation therapy: Secondary | ICD-10-CM | POA: Diagnosis not present

## 2017-07-31 ENCOUNTER — Ambulatory Visit
Admission: RE | Admit: 2017-07-31 | Discharge: 2017-07-31 | Disposition: A | Discharge status: Home / Self Care | Payer: PPO | Source: Ambulatory Visit | Attending: Radiation Oncology | Admitting: Radiation Oncology

## 2017-07-31 DIAGNOSIS — Z510 Encounter for antineoplastic radiation therapy: Secondary | ICD-10-CM | POA: Diagnosis not present

## 2017-07-31 DIAGNOSIS — C61 Malignant neoplasm of prostate: Secondary | ICD-10-CM | POA: Diagnosis not present

## 2017-08-01 ENCOUNTER — Ambulatory Visit
Admission: RE | Admit: 2017-08-01 | Discharge: 2017-08-01 | Disposition: A | Discharge status: Home / Self Care | Payer: PPO | Source: Ambulatory Visit | Attending: Radiation Oncology | Admitting: Radiation Oncology

## 2017-08-01 DIAGNOSIS — Z510 Encounter for antineoplastic radiation therapy: Secondary | ICD-10-CM | POA: Diagnosis not present

## 2017-08-01 DIAGNOSIS — C61 Malignant neoplasm of prostate: Secondary | ICD-10-CM | POA: Diagnosis not present

## 2017-08-02 ENCOUNTER — Ambulatory Visit
Admission: RE | Admit: 2017-08-02 | Discharge: 2017-08-02 | Disposition: A | Discharge status: Home / Self Care | Payer: PPO | Source: Ambulatory Visit | Attending: Radiation Oncology | Admitting: Radiation Oncology

## 2017-08-02 DIAGNOSIS — Z510 Encounter for antineoplastic radiation therapy: Secondary | ICD-10-CM | POA: Diagnosis not present

## 2017-08-02 DIAGNOSIS — C61 Malignant neoplasm of prostate: Secondary | ICD-10-CM | POA: Diagnosis not present

## 2017-08-05 ENCOUNTER — Ambulatory Visit
Admission: RE | Admit: 2017-08-05 | Discharge: 2017-08-05 | Disposition: A | Discharge status: Home / Self Care | Payer: PPO | Source: Ambulatory Visit | Attending: Radiation Oncology | Admitting: Radiation Oncology

## 2017-08-05 DIAGNOSIS — C61 Malignant neoplasm of prostate: Secondary | ICD-10-CM | POA: Diagnosis not present

## 2017-08-05 DIAGNOSIS — Z510 Encounter for antineoplastic radiation therapy: Secondary | ICD-10-CM | POA: Diagnosis not present

## 2017-08-06 ENCOUNTER — Ambulatory Visit
Admission: RE | Admit: 2017-08-06 | Discharge: 2017-08-06 | Disposition: A | Discharge status: Home / Self Care | Payer: PPO | Source: Ambulatory Visit | Attending: Radiation Oncology | Admitting: Radiation Oncology

## 2017-08-06 DIAGNOSIS — Z510 Encounter for antineoplastic radiation therapy: Secondary | ICD-10-CM | POA: Diagnosis not present

## 2017-08-06 DIAGNOSIS — C61 Malignant neoplasm of prostate: Secondary | ICD-10-CM | POA: Diagnosis not present

## 2017-08-07 ENCOUNTER — Ambulatory Visit
Admission: RE | Admit: 2017-08-07 | Discharge: 2017-08-07 | Disposition: A | Discharge status: Home / Self Care | Payer: PPO | Source: Ambulatory Visit | Attending: Radiation Oncology | Admitting: Radiation Oncology

## 2017-08-07 DIAGNOSIS — Z510 Encounter for antineoplastic radiation therapy: Secondary | ICD-10-CM | POA: Diagnosis not present

## 2017-08-07 DIAGNOSIS — C61 Malignant neoplasm of prostate: Secondary | ICD-10-CM | POA: Diagnosis not present

## 2017-08-08 ENCOUNTER — Ambulatory Visit
Admission: RE | Admit: 2017-08-08 | Discharge: 2017-08-08 | Disposition: A | Discharge status: Home / Self Care | Payer: PPO | Source: Ambulatory Visit | Attending: Radiation Oncology | Admitting: Radiation Oncology

## 2017-08-08 DIAGNOSIS — Z510 Encounter for antineoplastic radiation therapy: Secondary | ICD-10-CM | POA: Diagnosis not present

## 2017-08-08 DIAGNOSIS — C61 Malignant neoplasm of prostate: Secondary | ICD-10-CM | POA: Diagnosis not present

## 2017-08-09 ENCOUNTER — Ambulatory Visit
Admission: RE | Admit: 2017-08-09 | Discharge: 2017-08-09 | Disposition: A | Discharge status: Home / Self Care | Payer: PPO | Source: Ambulatory Visit | Attending: Radiation Oncology | Admitting: Radiation Oncology

## 2017-08-09 DIAGNOSIS — C61 Malignant neoplasm of prostate: Secondary | ICD-10-CM | POA: Diagnosis not present

## 2017-08-09 DIAGNOSIS — Z510 Encounter for antineoplastic radiation therapy: Secondary | ICD-10-CM | POA: Diagnosis not present

## 2017-08-12 ENCOUNTER — Ambulatory Visit
Admission: RE | Admit: 2017-08-12 | Discharge: 2017-08-12 | Disposition: A | Discharge status: Home / Self Care | Payer: PPO | Source: Ambulatory Visit | Attending: Radiation Oncology | Admitting: Radiation Oncology

## 2017-08-12 DIAGNOSIS — C61 Malignant neoplasm of prostate: Secondary | ICD-10-CM | POA: Diagnosis not present

## 2017-08-12 DIAGNOSIS — Z510 Encounter for antineoplastic radiation therapy: Secondary | ICD-10-CM | POA: Diagnosis not present

## 2017-08-13 ENCOUNTER — Inpatient Hospital Stay: Payer: PPO | Attending: Radiation Oncology

## 2017-08-13 ENCOUNTER — Ambulatory Visit
Admission: RE | Admit: 2017-08-13 | Discharge: 2017-08-13 | Disposition: A | Discharge status: Home / Self Care | Payer: PPO | Source: Ambulatory Visit | Attending: Radiation Oncology | Admitting: Radiation Oncology

## 2017-08-13 DIAGNOSIS — Z510 Encounter for antineoplastic radiation therapy: Secondary | ICD-10-CM | POA: Diagnosis not present

## 2017-08-13 DIAGNOSIS — C61 Malignant neoplasm of prostate: Secondary | ICD-10-CM | POA: Insufficient documentation

## 2017-08-13 LAB — CBC
HCT: 34.9 % — ABNORMAL LOW (ref 40.0–52.0)
Hemoglobin: 11.3 g/dL — ABNORMAL LOW (ref 13.0–18.0)
MCH: 29.7 pg (ref 26.0–34.0)
MCHC: 32.4 g/dL (ref 32.0–36.0)
MCV: 91.8 fL (ref 80.0–100.0)
Platelets: 138 10*3/uL — ABNORMAL LOW (ref 150–440)
RBC: 3.81 MIL/uL — ABNORMAL LOW (ref 4.40–5.90)
RDW: 14.5 % (ref 11.5–14.5)
WBC: 5.3 10*3/uL (ref 3.8–10.6)

## 2017-08-14 ENCOUNTER — Ambulatory Visit
Admission: RE | Admit: 2017-08-14 | Discharge: 2017-08-14 | Disposition: A | Discharge status: Home / Self Care | Payer: PPO | Source: Ambulatory Visit | Attending: Radiation Oncology | Admitting: Radiation Oncology

## 2017-08-14 DIAGNOSIS — C61 Malignant neoplasm of prostate: Secondary | ICD-10-CM | POA: Diagnosis not present

## 2017-08-14 DIAGNOSIS — Z510 Encounter for antineoplastic radiation therapy: Secondary | ICD-10-CM | POA: Diagnosis not present

## 2017-08-15 ENCOUNTER — Ambulatory Visit
Admission: RE | Admit: 2017-08-15 | Discharge: 2017-08-15 | Disposition: A | Discharge status: Home / Self Care | Payer: PPO | Source: Ambulatory Visit | Attending: Radiation Oncology | Admitting: Radiation Oncology

## 2017-08-15 DIAGNOSIS — Z510 Encounter for antineoplastic radiation therapy: Secondary | ICD-10-CM | POA: Diagnosis not present

## 2017-08-15 DIAGNOSIS — C61 Malignant neoplasm of prostate: Secondary | ICD-10-CM | POA: Diagnosis not present

## 2017-08-16 ENCOUNTER — Ambulatory Visit
Admission: RE | Admit: 2017-08-16 | Discharge: 2017-08-16 | Disposition: A | Discharge status: Home / Self Care | Payer: PPO | Source: Ambulatory Visit | Attending: Radiation Oncology | Admitting: Radiation Oncology

## 2017-08-16 DIAGNOSIS — C61 Malignant neoplasm of prostate: Secondary | ICD-10-CM | POA: Diagnosis not present

## 2017-08-16 DIAGNOSIS — Z510 Encounter for antineoplastic radiation therapy: Secondary | ICD-10-CM | POA: Diagnosis not present

## 2017-08-19 ENCOUNTER — Ambulatory Visit
Admission: RE | Admit: 2017-08-19 | Discharge: 2017-08-19 | Disposition: A | Discharge status: Home / Self Care | Payer: PPO | Source: Ambulatory Visit | Attending: Radiation Oncology | Admitting: Radiation Oncology

## 2017-08-19 DIAGNOSIS — Z510 Encounter for antineoplastic radiation therapy: Secondary | ICD-10-CM | POA: Diagnosis not present

## 2017-08-19 DIAGNOSIS — C61 Malignant neoplasm of prostate: Secondary | ICD-10-CM | POA: Diagnosis not present

## 2017-08-20 ENCOUNTER — Ambulatory Visit
Admission: RE | Admit: 2017-08-20 | Discharge: 2017-08-20 | Disposition: A | Discharge status: Home / Self Care | Payer: PPO | Source: Ambulatory Visit | Attending: Radiation Oncology | Admitting: Radiation Oncology

## 2017-08-20 DIAGNOSIS — C61 Malignant neoplasm of prostate: Secondary | ICD-10-CM | POA: Diagnosis not present

## 2017-08-20 DIAGNOSIS — Z510 Encounter for antineoplastic radiation therapy: Secondary | ICD-10-CM | POA: Diagnosis not present

## 2017-08-21 ENCOUNTER — Ambulatory Visit
Admission: RE | Admit: 2017-08-21 | Discharge: 2017-08-21 | Disposition: A | Discharge status: Home / Self Care | Payer: PPO | Source: Ambulatory Visit | Attending: Radiation Oncology | Admitting: Radiation Oncology

## 2017-08-21 DIAGNOSIS — M1711 Unilateral primary osteoarthritis, right knee: Secondary | ICD-10-CM | POA: Diagnosis not present

## 2017-08-21 DIAGNOSIS — M1611 Unilateral primary osteoarthritis, right hip: Secondary | ICD-10-CM | POA: Diagnosis not present

## 2017-08-21 DIAGNOSIS — C61 Malignant neoplasm of prostate: Secondary | ICD-10-CM | POA: Diagnosis not present

## 2017-08-21 DIAGNOSIS — M25561 Pain in right knee: Secondary | ICD-10-CM | POA: Diagnosis not present

## 2017-08-21 DIAGNOSIS — Z510 Encounter for antineoplastic radiation therapy: Secondary | ICD-10-CM | POA: Diagnosis not present

## 2017-08-22 ENCOUNTER — Ambulatory Visit
Admission: RE | Admit: 2017-08-22 | Discharge: 2017-08-22 | Disposition: A | Discharge status: Home / Self Care | Payer: PPO | Source: Ambulatory Visit | Attending: Radiation Oncology | Admitting: Radiation Oncology

## 2017-08-22 DIAGNOSIS — Z510 Encounter for antineoplastic radiation therapy: Secondary | ICD-10-CM | POA: Diagnosis not present

## 2017-08-22 DIAGNOSIS — C61 Malignant neoplasm of prostate: Secondary | ICD-10-CM | POA: Diagnosis not present

## 2017-08-23 ENCOUNTER — Ambulatory Visit
Admission: RE | Admit: 2017-08-23 | Discharge: 2017-08-23 | Disposition: A | Discharge status: Home / Self Care | Payer: PPO | Source: Ambulatory Visit | Attending: Radiation Oncology | Admitting: Radiation Oncology

## 2017-08-23 DIAGNOSIS — Z510 Encounter for antineoplastic radiation therapy: Secondary | ICD-10-CM | POA: Diagnosis not present

## 2017-08-23 DIAGNOSIS — C61 Malignant neoplasm of prostate: Secondary | ICD-10-CM | POA: Diagnosis not present

## 2017-08-26 ENCOUNTER — Ambulatory Visit
Admission: RE | Admit: 2017-08-26 | Discharge: 2017-08-26 | Disposition: A | Discharge status: Home / Self Care | Payer: PPO | Source: Ambulatory Visit | Attending: Radiation Oncology | Admitting: Radiation Oncology

## 2017-08-26 DIAGNOSIS — Z510 Encounter for antineoplastic radiation therapy: Secondary | ICD-10-CM | POA: Diagnosis not present

## 2017-08-26 DIAGNOSIS — C61 Malignant neoplasm of prostate: Secondary | ICD-10-CM | POA: Diagnosis not present

## 2017-08-27 ENCOUNTER — Ambulatory Visit
Admission: RE | Admit: 2017-08-27 | Discharge: 2017-08-27 | Disposition: A | Discharge status: Home / Self Care | Payer: PPO | Source: Ambulatory Visit | Attending: Radiation Oncology | Admitting: Radiation Oncology

## 2017-08-27 ENCOUNTER — Inpatient Hospital Stay: Payer: PPO | Attending: Radiation Oncology

## 2017-08-27 DIAGNOSIS — C61 Malignant neoplasm of prostate: Secondary | ICD-10-CM

## 2017-08-27 DIAGNOSIS — Z510 Encounter for antineoplastic radiation therapy: Secondary | ICD-10-CM | POA: Diagnosis not present

## 2017-08-27 LAB — CBC
HCT: 33.1 % — ABNORMAL LOW (ref 40.0–52.0)
Hemoglobin: 10.8 g/dL — ABNORMAL LOW (ref 13.0–18.0)
MCH: 30.5 pg (ref 26.0–34.0)
MCHC: 32.8 g/dL (ref 32.0–36.0)
MCV: 93.1 fL (ref 80.0–100.0)
Platelets: 134 10*3/uL — ABNORMAL LOW (ref 150–440)
RBC: 3.55 MIL/uL — ABNORMAL LOW (ref 4.40–5.90)
RDW: 15.4 % — ABNORMAL HIGH (ref 11.5–14.5)
WBC: 5 10*3/uL (ref 3.8–10.6)

## 2017-08-28 ENCOUNTER — Ambulatory Visit
Admission: RE | Admit: 2017-08-28 | Discharge: 2017-08-28 | Disposition: A | Discharge status: Home / Self Care | Payer: PPO | Source: Ambulatory Visit | Attending: Radiation Oncology | Admitting: Radiation Oncology

## 2017-08-28 DIAGNOSIS — Z510 Encounter for antineoplastic radiation therapy: Secondary | ICD-10-CM | POA: Diagnosis not present

## 2017-08-28 DIAGNOSIS — C61 Malignant neoplasm of prostate: Secondary | ICD-10-CM | POA: Diagnosis not present

## 2017-08-29 ENCOUNTER — Ambulatory Visit
Admission: RE | Admit: 2017-08-29 | Discharge: 2017-08-29 | Disposition: A | Discharge status: Home / Self Care | Payer: PPO | Source: Ambulatory Visit | Attending: Radiation Oncology | Admitting: Radiation Oncology

## 2017-08-29 DIAGNOSIS — Z510 Encounter for antineoplastic radiation therapy: Secondary | ICD-10-CM | POA: Diagnosis not present

## 2017-08-29 DIAGNOSIS — C61 Malignant neoplasm of prostate: Secondary | ICD-10-CM | POA: Diagnosis not present

## 2017-08-30 ENCOUNTER — Ambulatory Visit
Admission: RE | Admit: 2017-08-30 | Discharge: 2017-08-30 | Disposition: A | Discharge status: Home / Self Care | Payer: PPO | Source: Ambulatory Visit | Attending: Radiation Oncology | Admitting: Radiation Oncology

## 2017-08-30 DIAGNOSIS — C61 Malignant neoplasm of prostate: Secondary | ICD-10-CM | POA: Diagnosis not present

## 2017-08-30 DIAGNOSIS — Z510 Encounter for antineoplastic radiation therapy: Secondary | ICD-10-CM | POA: Diagnosis not present

## 2017-09-02 ENCOUNTER — Ambulatory Visit
Admission: RE | Admit: 2017-09-02 | Discharge: 2017-09-02 | Disposition: A | Discharge status: Home / Self Care | Payer: PPO | Source: Ambulatory Visit | Attending: Radiation Oncology | Admitting: Radiation Oncology

## 2017-09-02 DIAGNOSIS — Z510 Encounter for antineoplastic radiation therapy: Secondary | ICD-10-CM | POA: Diagnosis not present

## 2017-09-02 DIAGNOSIS — C61 Malignant neoplasm of prostate: Secondary | ICD-10-CM | POA: Diagnosis not present

## 2017-09-03 ENCOUNTER — Ambulatory Visit
Admission: RE | Admit: 2017-09-03 | Discharge: 2017-09-03 | Disposition: A | Discharge status: Home / Self Care | Payer: PPO | Source: Ambulatory Visit | Attending: Radiation Oncology | Admitting: Radiation Oncology

## 2017-09-03 DIAGNOSIS — Z510 Encounter for antineoplastic radiation therapy: Secondary | ICD-10-CM | POA: Diagnosis not present

## 2017-09-03 DIAGNOSIS — C61 Malignant neoplasm of prostate: Secondary | ICD-10-CM | POA: Diagnosis not present

## 2017-09-04 ENCOUNTER — Ambulatory Visit
Admission: RE | Admit: 2017-09-04 | Discharge: 2017-09-04 | Disposition: A | Discharge status: Home / Self Care | Payer: PPO | Source: Ambulatory Visit | Attending: Radiation Oncology | Admitting: Radiation Oncology

## 2017-09-04 DIAGNOSIS — C61 Malignant neoplasm of prostate: Secondary | ICD-10-CM | POA: Diagnosis not present

## 2017-09-04 DIAGNOSIS — Z510 Encounter for antineoplastic radiation therapy: Secondary | ICD-10-CM | POA: Diagnosis not present

## 2017-09-05 ENCOUNTER — Ambulatory Visit
Admission: RE | Admit: 2017-09-05 | Discharge: 2017-09-05 | Disposition: A | Discharge status: Home / Self Care | Payer: PPO | Source: Ambulatory Visit | Attending: Radiation Oncology | Admitting: Radiation Oncology

## 2017-09-05 DIAGNOSIS — Z510 Encounter for antineoplastic radiation therapy: Secondary | ICD-10-CM | POA: Diagnosis not present

## 2017-09-05 DIAGNOSIS — C61 Malignant neoplasm of prostate: Secondary | ICD-10-CM | POA: Diagnosis not present

## 2017-09-06 ENCOUNTER — Ambulatory Visit
Admission: RE | Admit: 2017-09-06 | Discharge: 2017-09-06 | Disposition: A | Discharge status: Home / Self Care | Payer: PPO | Source: Ambulatory Visit | Attending: Radiation Oncology | Admitting: Radiation Oncology

## 2017-09-06 DIAGNOSIS — Z510 Encounter for antineoplastic radiation therapy: Secondary | ICD-10-CM | POA: Diagnosis not present

## 2017-09-06 DIAGNOSIS — C61 Malignant neoplasm of prostate: Secondary | ICD-10-CM | POA: Diagnosis not present

## 2017-09-09 ENCOUNTER — Ambulatory Visit
Admission: RE | Admit: 2017-09-09 | Discharge: 2017-09-09 | Disposition: A | Discharge status: Home / Self Care | Payer: PPO | Source: Ambulatory Visit | Attending: Radiation Oncology | Admitting: Radiation Oncology

## 2017-09-09 DIAGNOSIS — Z510 Encounter for antineoplastic radiation therapy: Secondary | ICD-10-CM | POA: Diagnosis not present

## 2017-09-09 DIAGNOSIS — C61 Malignant neoplasm of prostate: Secondary | ICD-10-CM | POA: Diagnosis not present

## 2017-09-10 ENCOUNTER — Inpatient Hospital Stay: Payer: PPO

## 2017-09-10 ENCOUNTER — Ambulatory Visit
Admission: RE | Admit: 2017-09-10 | Discharge: 2017-09-10 | Disposition: A | Discharge status: Home / Self Care | Payer: PPO | Source: Ambulatory Visit | Attending: Radiation Oncology | Admitting: Radiation Oncology

## 2017-09-10 DIAGNOSIS — C61 Malignant neoplasm of prostate: Secondary | ICD-10-CM | POA: Diagnosis not present

## 2017-09-10 DIAGNOSIS — Z510 Encounter for antineoplastic radiation therapy: Secondary | ICD-10-CM | POA: Diagnosis not present

## 2017-09-10 LAB — CBC
HCT: 32.1 % — ABNORMAL LOW (ref 40.0–52.0)
Hemoglobin: 10.5 g/dL — ABNORMAL LOW (ref 13.0–18.0)
MCH: 30.8 pg (ref 26.0–34.0)
MCHC: 32.8 g/dL (ref 32.0–36.0)
MCV: 93.8 fL (ref 80.0–100.0)
Platelets: 134 10*3/uL — ABNORMAL LOW (ref 150–440)
RBC: 3.42 MIL/uL — ABNORMAL LOW (ref 4.40–5.90)
RDW: 15.7 % — ABNORMAL HIGH (ref 11.5–14.5)
WBC: 3.9 10*3/uL (ref 3.8–10.6)

## 2017-09-11 ENCOUNTER — Ambulatory Visit
Admission: RE | Admit: 2017-09-11 | Discharge: 2017-09-11 | Disposition: A | Discharge status: Home / Self Care | Payer: PPO | Source: Ambulatory Visit | Attending: Radiation Oncology | Admitting: Radiation Oncology

## 2017-09-11 DIAGNOSIS — C61 Malignant neoplasm of prostate: Secondary | ICD-10-CM | POA: Diagnosis not present

## 2017-09-11 DIAGNOSIS — Z510 Encounter for antineoplastic radiation therapy: Secondary | ICD-10-CM | POA: Diagnosis not present

## 2017-09-12 ENCOUNTER — Ambulatory Visit
Admission: RE | Admit: 2017-09-12 | Discharge: 2017-09-12 | Disposition: A | Discharge status: Home / Self Care | Payer: PPO | Source: Ambulatory Visit | Attending: Radiation Oncology | Admitting: Radiation Oncology

## 2017-09-12 DIAGNOSIS — C61 Malignant neoplasm of prostate: Secondary | ICD-10-CM | POA: Diagnosis not present

## 2017-09-12 DIAGNOSIS — Z510 Encounter for antineoplastic radiation therapy: Secondary | ICD-10-CM | POA: Diagnosis not present

## 2017-09-13 ENCOUNTER — Ambulatory Visit
Admission: RE | Admit: 2017-09-13 | Discharge: 2017-09-13 | Disposition: A | Discharge status: Home / Self Care | Payer: PPO | Source: Ambulatory Visit | Attending: Radiation Oncology | Admitting: Radiation Oncology

## 2017-09-13 DIAGNOSIS — C61 Malignant neoplasm of prostate: Secondary | ICD-10-CM | POA: Diagnosis not present

## 2017-09-13 DIAGNOSIS — Z510 Encounter for antineoplastic radiation therapy: Secondary | ICD-10-CM | POA: Diagnosis not present

## 2017-09-16 ENCOUNTER — Ambulatory Visit
Admission: RE | Admit: 2017-09-16 | Discharge: 2017-09-16 | Disposition: A | Discharge status: Home / Self Care | Payer: PPO | Source: Ambulatory Visit | Attending: Radiation Oncology | Admitting: Radiation Oncology

## 2017-09-16 DIAGNOSIS — C61 Malignant neoplasm of prostate: Secondary | ICD-10-CM | POA: Diagnosis not present

## 2017-09-16 DIAGNOSIS — Z510 Encounter for antineoplastic radiation therapy: Secondary | ICD-10-CM | POA: Diagnosis not present

## 2017-09-17 ENCOUNTER — Ambulatory Visit
Admission: RE | Admit: 2017-09-17 | Discharge: 2017-09-17 | Disposition: A | Discharge status: Home / Self Care | Payer: PPO | Source: Ambulatory Visit | Attending: Radiation Oncology | Admitting: Radiation Oncology

## 2017-09-17 DIAGNOSIS — Z510 Encounter for antineoplastic radiation therapy: Secondary | ICD-10-CM | POA: Diagnosis not present

## 2017-09-17 DIAGNOSIS — C61 Malignant neoplasm of prostate: Secondary | ICD-10-CM | POA: Diagnosis not present

## 2017-09-18 ENCOUNTER — Ambulatory Visit
Admission: RE | Admit: 2017-09-18 | Discharge: 2017-09-18 | Disposition: A | Discharge status: Home / Self Care | Payer: PPO | Source: Ambulatory Visit | Attending: Radiation Oncology | Admitting: Radiation Oncology

## 2017-09-18 DIAGNOSIS — Z510 Encounter for antineoplastic radiation therapy: Secondary | ICD-10-CM | POA: Diagnosis not present

## 2017-09-18 DIAGNOSIS — C61 Malignant neoplasm of prostate: Secondary | ICD-10-CM | POA: Diagnosis not present

## 2017-09-19 ENCOUNTER — Ambulatory Visit
Admission: RE | Admit: 2017-09-19 | Discharge: 2017-09-19 | Disposition: A | Discharge status: Home / Self Care | Payer: PPO | Source: Ambulatory Visit | Attending: Radiation Oncology | Admitting: Radiation Oncology

## 2017-09-19 DIAGNOSIS — C61 Malignant neoplasm of prostate: Secondary | ICD-10-CM | POA: Diagnosis not present

## 2017-09-19 DIAGNOSIS — Z510 Encounter for antineoplastic radiation therapy: Secondary | ICD-10-CM | POA: Diagnosis not present

## 2017-09-20 ENCOUNTER — Ambulatory Visit
Admission: RE | Admit: 2017-09-20 | Discharge: 2017-09-20 | Disposition: A | Discharge status: Home / Self Care | Payer: PPO | Source: Ambulatory Visit | Attending: Radiation Oncology | Admitting: Radiation Oncology

## 2017-09-20 DIAGNOSIS — C61 Malignant neoplasm of prostate: Secondary | ICD-10-CM | POA: Diagnosis not present

## 2017-09-20 DIAGNOSIS — Z510 Encounter for antineoplastic radiation therapy: Secondary | ICD-10-CM | POA: Diagnosis not present

## 2017-09-23 ENCOUNTER — Ambulatory Visit
Admission: RE | Admit: 2017-09-23 | Discharge: 2017-09-23 | Disposition: A | Discharge status: Home / Self Care | Payer: PPO | Source: Ambulatory Visit | Attending: Radiation Oncology | Admitting: Radiation Oncology

## 2017-09-23 DIAGNOSIS — C61 Malignant neoplasm of prostate: Secondary | ICD-10-CM | POA: Diagnosis not present

## 2017-09-23 DIAGNOSIS — Z510 Encounter for antineoplastic radiation therapy: Secondary | ICD-10-CM | POA: Diagnosis not present

## 2017-10-10 ENCOUNTER — Encounter: Payer: Self-pay | Admitting: *Deleted

## 2017-10-10 ENCOUNTER — Other Ambulatory Visit: Payer: Self-pay | Admitting: *Deleted

## 2017-10-10 NOTE — Patient Outreach (Addendum)
HTA High Risk Patient Screening attempted, however, pt was not able to hear me. He asked me to call him back after 6:00 pm when his wife is there. I advised I would send him our information and if they feel he could benefit from our services, he can call me back.  Eulah Pont. Myrtie Neither, MSN, GNP-BC Gerontological Nurse Practitioner [facility] Care Management [phone]  Mrs. Cephus Shelling called me back and we discussed pt condition which is remarkable. 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 He is very HOH and has hearing aids but does not like to wear them. I explained our services and Mrs. Cephus Shelling does not feel they are necessary at this time but was very appreciative of the information. I advised her I would be sending the letter with our brochure for future reference.  Eulah Pont. Myrtie Neither, MSN, [facility] Gerontological Nurse Practitioner [facility] Care Management [phone]

## 2017-10-28 ENCOUNTER — Encounter: Payer: Self-pay | Admitting: Radiation Oncology

## 2017-10-28 ENCOUNTER — Ambulatory Visit
Admission: RE | Admit: 2017-10-28 | Discharge: 2017-10-28 | Disposition: A | Discharge status: Home / Self Care | Payer: PPO | Source: Ambulatory Visit | Attending: Radiation Oncology | Admitting: Radiation Oncology

## 2017-10-28 ENCOUNTER — Other Ambulatory Visit: Payer: Self-pay

## 2017-10-28 ENCOUNTER — Other Ambulatory Visit: Payer: Self-pay | Admitting: *Deleted

## 2017-10-28 VITALS — BP 152/70 | HR 94 | Resp 18 | Wt 168.2 lb

## 2017-10-28 DIAGNOSIS — Z923 Personal history of irradiation: Secondary | ICD-10-CM | POA: Diagnosis not present

## 2017-10-28 DIAGNOSIS — C61 Malignant neoplasm of prostate: Secondary | ICD-10-CM | POA: Diagnosis not present

## 2017-10-28 NOTE — Progress Notes (Signed)
Radiation Oncology Follow up Note  Name: Ruben Green   Date:   10/28/2017 MRN:  [id number] DOB: 11-10-1933    This 84 y.o. male presents to the clinic today for one-month follow-up.status post radiation therapy to his prostate and pelvic nodes for stage IIB adenocarcinoma  REFERRING PROVIDER: Tracie Harrier, MD  HPI: patient is a 84 year old male now out 1 month having completed external beam radiation therapy to his prostate for Gleason 8 (4+4) adenocarcinoma the prostate presenting the PSA of 12.7. Seen today in routine follow-up he is doing well. He specifically denies increased lower urinary tract symptoms nocturia diarrhea or fatigue..  COMPLICATIONS OF TREATMENT: none  FOLLOW UP COMPLIANCE: keeps appointments   PHYSICAL EXAM:  BP (!) 152/70   Pulse 94   Resp 18   Wt 168 lb 3.4 oz (76.3 kg)   BMI 30.77 kg/m  Well-developed well-nourished patient in NAD. HEENT reveals PERLA, EOMI, discs not visualized.  Oral cavity is clear. No oral mucosal lesions are identified. Neck is clear without evidence of cervical or supraclavicular adenopathy. Lungs are clear to A&P. Cardiac examination is essentially unremarkable with regular rate and rhythm without murmur rub or thrill. Abdomen is benign with no organomegaly or masses noted. Motor sensory and DTR levels are equal and symmetric in the upper and lower extremities. Cranial nerves II through XII are grossly intact. Proprioception is intact. No peripheral adenopathy or edema is identified. No motor or sensory levels are noted. Crude visual fields are within normal range.  RADIOLOGY RESULTS: no current films for review  PLAN: resent time he is recovering nicely from his radiation therapy treatments. Minimal side effects. I've asked to see him back in 3 months with a PSA prior to that visit. We'll continue Lupron suppression on the patient. Patient is to call with any concerns.  I would like to take this opportunity to thank you for  allowing me to participate in the care of your patient.Noreene Filbert, MD

## 2017-10-31 DIAGNOSIS — R739 Hyperglycemia, unspecified: Secondary | ICD-10-CM | POA: Diagnosis not present

## 2017-10-31 DIAGNOSIS — E782 Mixed hyperlipidemia: Secondary | ICD-10-CM | POA: Diagnosis not present

## 2017-10-31 DIAGNOSIS — I2510 Atherosclerotic heart disease of native coronary artery without angina pectoris: Secondary | ICD-10-CM | POA: Diagnosis not present

## 2017-10-31 DIAGNOSIS — C61 Malignant neoplasm of prostate: Secondary | ICD-10-CM | POA: Diagnosis not present

## 2017-11-04 DIAGNOSIS — H1013 Acute atopic conjunctivitis, bilateral: Secondary | ICD-10-CM | POA: Diagnosis not present

## 2017-11-05 DIAGNOSIS — Z0000 Encounter for general adult medical examination without abnormal findings: Secondary | ICD-10-CM | POA: Diagnosis not present

## 2017-11-05 DIAGNOSIS — I2510 Atherosclerotic heart disease of native coronary artery without angina pectoris: Secondary | ICD-10-CM | POA: Diagnosis not present

## 2017-11-05 DIAGNOSIS — C61 Malignant neoplasm of prostate: Secondary | ICD-10-CM | POA: Diagnosis not present

## 2017-11-05 DIAGNOSIS — E782 Mixed hyperlipidemia: Secondary | ICD-10-CM | POA: Diagnosis not present

## 2017-11-05 DIAGNOSIS — F419 Anxiety disorder, unspecified: Secondary | ICD-10-CM | POA: Diagnosis not present

## 2017-11-05 DIAGNOSIS — I10 Essential (primary) hypertension: Secondary | ICD-10-CM | POA: Diagnosis not present

## 2017-11-05 DIAGNOSIS — E538 Deficiency of other specified B group vitamins: Secondary | ICD-10-CM | POA: Diagnosis not present

## 2017-12-17 DIAGNOSIS — H353212 Exudative age-related macular degeneration, right eye, with inactive choroidal neovascularization: Secondary | ICD-10-CM | POA: Diagnosis not present

## 2018-01-28 ENCOUNTER — Telehealth: Payer: Self-pay | Admitting: Urology

## 2018-01-28 NOTE — Telephone Encounter (Signed)
Pharmacy at [facility] called office asking for Refill for finesteride 5mg  quantity 90. Please advise Colletta [place] at [phone]. Thank you.

## 2018-01-29 NOTE — Telephone Encounter (Signed)
Since he has been treated with radiation for prostate cancer he can discontinue the finasteride.

## 2018-01-29 NOTE — Telephone Encounter (Signed)
Patient had gold seed markers placed in January 2019.  Does he need to continue to take the Finasteride?  Please advise if it is okay to refill.

## 2018-01-30 NOTE — Telephone Encounter (Signed)
Left pt mess to call, pharm notified

## 2018-02-13 ENCOUNTER — Inpatient Hospital Stay: Payer: PPO | Attending: Radiation Oncology

## 2018-02-13 DIAGNOSIS — C61 Malignant neoplasm of prostate: Secondary | ICD-10-CM | POA: Insufficient documentation

## 2018-02-13 LAB — PSA: Prostatic Specific Antigen: 0.01 ng/mL (ref 0.00–4.00)

## 2018-02-20 ENCOUNTER — Ambulatory Visit
Admission: RE | Admit: 2018-02-20 | Discharge: 2018-02-20 | Disposition: A | Discharge status: Home / Self Care | Payer: PPO | Source: Ambulatory Visit | Attending: Radiation Oncology | Admitting: Radiation Oncology

## 2018-02-20 ENCOUNTER — Encounter: Payer: Self-pay | Admitting: Radiation Oncology

## 2018-02-20 ENCOUNTER — Other Ambulatory Visit: Payer: Self-pay

## 2018-02-20 DIAGNOSIS — Z923 Personal history of irradiation: Secondary | ICD-10-CM | POA: Diagnosis not present

## 2018-02-20 DIAGNOSIS — C61 Malignant neoplasm of prostate: Secondary | ICD-10-CM | POA: Insufficient documentation

## 2018-02-20 NOTE — Progress Notes (Signed)
Radiation Oncology Follow up Note  Name: Ruben Green   Date:   02/20/2018 MRN:  [id number] DOB: 02-16-1934    This 84 y.o. male presents to the clinic today for four-month follow-up status post revision therapy to his prostate and pelvic nodes for stage IIB adenocarcinoma.  REFERRING PROVIDER: Tracie Harrier, MD  HPI: patient is an 84 year old male now out 4 months having completed radiation therapy to his prostate and pelvic nodes for Gleason 8 (4+4) adenocarcinoma presenting the PSA of 12.7. He is seen today in routine follow-up is doing well. He has nocturia 2-3. No specific bowel problems no diarrhea.Marland KitchenPSA now is less than 6.57  COMPLICATIONS OF TREATMENT: none  FOLLOW UP COMPLIANCE: keeps appointments   PHYSICAL EXAM:  There were no vitals taken for this visit. Well-developed well-nourished patient in NAD. HEENT reveals PERLA, EOMI, discs not visualized.  Oral cavity is clear. No oral mucosal lesions are identified. Neck is clear without evidence of cervical or supraclavicular adenopathy. Lungs are clear to A&P. Cardiac examination is essentially unremarkable with regular rate and rhythm without murmur rub or thrill. Abdomen is benign with no organomegaly or masses noted. Motor sensory and DTR levels are equal and symmetric in the upper and lower extremities. Cranial nerves II through XII are grossly intact. Proprioception is intact. No peripheral adenopathy or edema is identified. No motor or sensory levels are noted. Crude visual fields are within normal range.  RADIOLOGY RESULTS: no current films for review  PLAN: present time patient is doing well with very low side effect profile. I am please was overall progress. His PSA is now less than 0.01.he continues close follow-up care with urology. We will keep him suppressed on Lupron and will coordinate that with urology. I've asked to see him back in 6 months for follow-up a PSA prior to that visit. Patient family know to call  sooner with any concerns.  I would like to take this opportunity to thank you for allowing me to participate in the care of your patient.Noreene Filbert, MD

## 2018-02-26 ENCOUNTER — Other Ambulatory Visit: Payer: Self-pay | Admitting: Urology

## 2018-02-27 ENCOUNTER — Telehealth: Payer: Self-pay | Admitting: Urology

## 2018-02-27 NOTE — Telephone Encounter (Signed)
Spoke with [name] patient's wife and she will call back tomorrow to schedule this.   Sharyn Lull

## 2018-02-27 NOTE — Telephone Encounter (Signed)
Ruben Green needs an appointment for a Lupron injection and exam.

## 2018-03-04 DIAGNOSIS — C61 Malignant neoplasm of prostate: Secondary | ICD-10-CM | POA: Diagnosis not present

## 2018-03-04 DIAGNOSIS — I10 Essential (primary) hypertension: Secondary | ICD-10-CM | POA: Diagnosis not present

## 2018-03-04 DIAGNOSIS — I2510 Atherosclerotic heart disease of native coronary artery without angina pectoris: Secondary | ICD-10-CM | POA: Diagnosis not present

## 2018-03-04 DIAGNOSIS — Z131 Encounter for screening for diabetes mellitus: Secondary | ICD-10-CM | POA: Diagnosis not present

## 2018-03-04 DIAGNOSIS — Z0000 Encounter for general adult medical examination without abnormal findings: Secondary | ICD-10-CM | POA: Diagnosis not present

## 2018-03-04 DIAGNOSIS — F419 Anxiety disorder, unspecified: Secondary | ICD-10-CM | POA: Diagnosis not present

## 2018-03-04 DIAGNOSIS — E782 Mixed hyperlipidemia: Secondary | ICD-10-CM | POA: Diagnosis not present

## 2018-03-04 DIAGNOSIS — E538 Deficiency of other specified B group vitamins: Secondary | ICD-10-CM | POA: Diagnosis not present

## 2018-03-11 DIAGNOSIS — Z23 Encounter for immunization: Secondary | ICD-10-CM | POA: Diagnosis not present

## 2018-03-11 DIAGNOSIS — C61 Malignant neoplasm of prostate: Secondary | ICD-10-CM | POA: Diagnosis not present

## 2018-03-11 DIAGNOSIS — E782 Mixed hyperlipidemia: Secondary | ICD-10-CM | POA: Diagnosis not present

## 2018-03-11 DIAGNOSIS — I10 Essential (primary) hypertension: Secondary | ICD-10-CM | POA: Diagnosis not present

## 2018-03-11 DIAGNOSIS — M1611 Unilateral primary osteoarthritis, right hip: Secondary | ICD-10-CM | POA: Diagnosis not present

## 2018-03-11 DIAGNOSIS — E538 Deficiency of other specified B group vitamins: Secondary | ICD-10-CM | POA: Diagnosis not present

## 2018-03-11 DIAGNOSIS — I2510 Atherosclerotic heart disease of native coronary artery without angina pectoris: Secondary | ICD-10-CM | POA: Diagnosis not present

## 2018-03-11 DIAGNOSIS — M159 Polyosteoarthritis, unspecified: Secondary | ICD-10-CM | POA: Diagnosis not present

## 2018-03-11 DIAGNOSIS — D649 Anemia, unspecified: Secondary | ICD-10-CM | POA: Diagnosis not present

## 2018-03-18 ENCOUNTER — Encounter: Payer: Self-pay | Admitting: Urology

## 2018-03-18 ENCOUNTER — Ambulatory Visit: Payer: PPO | Admitting: Urology

## 2018-03-18 VITALS — BP 125/71 | HR 94 | Ht 63.0 in | Wt 171.1 lb

## 2018-03-18 DIAGNOSIS — C61 Malignant neoplasm of prostate: Secondary | ICD-10-CM

## 2018-03-18 MED ORDER — LEUPROLIDE ACETATE (3 MONTH) 22.5 MG IM KIT
22.5000 mg | PACK | Freq: Once | INTRAMUSCULAR | Status: AC
  Administered 2018-03-18: 22.5 mg via INTRAMUSCULAR

## 2018-03-18 NOTE — Progress Notes (Signed)
Lupron IM Injection   Due to Prostate Cancer patient is present today for a Lupron Injection.  Medication: Lupron 3 month Dose: 22.5 mg  Location: right upper outer buttocks Lot: [id number] Exp: [id number]  Patient tolerated well, no complications were noted  Performed by: Fonnie Jarvis cma  Follow up: 60months

## 2018-03-18 NOTE — Progress Notes (Signed)
03/18/2018 4:02 PM   Ruben Green 08-26-1933 [id number]  Referring provider: Tracie Harrier, MD [address] [facility] [place], [place] [address]  Chief Complaint  Patient presents with  . Elevated PSA    HPI: Patient is an 84 year old [place] male with a history of prostate cancer s/p radiation therapy who presents today for Lupron injection.  He is five months having completed radiation therapy to his prostate and pelvic nodes.    He has no complaints at this visit.  Patient denies any gross hematuria, dysuria or suprapubic/flank pain.  Patient denies any fevers, chills, nausea or vomiting.    He is not having constipation, diarrhea or blood in the stool.    His recent PSA was 0.01 in 01/2018.    PMH: Past Medical History:  Diagnosis Date  . Anxiety   . BPH with obstruction/lower urinary tract symptoms   . Chronic bronchitis ([place])   . COPD (chronic obstructive pulmonary disease) ([place])   . Elevated PSA   . Environmental and seasonal allergies   . HLD (hyperlipidemia)   . HOH (hard of hearing)   . HTN (hypertension)   . Incomplete bladder emptying   . Macular degeneration     Surgical History: Past Surgical History:  Procedure Laterality Date  . ABDOMINAL AORTIC ANEURYSM REPAIR    . CATARACT EXTRACTION W/PHACO Right 09/25/2016   Procedure: CATARACT EXTRACTION PHACO AND INTRAOCULAR LENS PLACEMENT (IOC);  Surgeon: Birder Robson, MD;  Location: [facility] ORS;  Service: Ophthalmology;  Laterality: Right;  [place] 01:01 AP% 17.4 CDE 10.75 Fluid pack lot # [id number]  . CATARACT EXTRACTION W/PHACO Left 10/16/2016   Procedure: CATARACT EXTRACTION PHACO AND INTRAOCULAR LENS PLACEMENT ([place]) Suture placed in Left eye;  Surgeon: Birder Robson, MD;  Location: [facility] ORS;  Service: Ophthalmology;  Laterality: Left;  [place] 2:06.8 AP% 22.2 CDE 28.17 Fluid pack lot # [id number]  . HERNIA REPAIR     x 2  . INGUINAL HERNIA REPAIR Right 12/02/2015   Procedure:  HERNIA REPAIR INGUINAL ADULT;  Surgeon: Leonie Green, MD;  Location: [facility] ORS;  Service: General;  Laterality: Right;  . PROSTATE SURGERY  2012  . Right Carotid Endarterectomy      Home Medications:  Allergies as of 03/18/2018   No Known Allergies     Medication List        Accurate as of 03/18/18  4:02 PM. Always use your most recent med list.          albuterol 108 (90 Base) MCG/ACT inhaler Commonly known as:  PROVENTIL HFA;VENTOLIN HFA Inhale 2 puffs into the lungs every 6 (six) hours as needed for wheezing or shortness of breath.   albuterol-ipratropium 18-103 MCG/ACT inhaler Commonly known as:  COMBIVENT Inhale 1-2 puffs into the lungs every 4 (four) hours.   amLODipine 10 MG tablet Commonly known as:  NORVASC Take 10 mg by mouth daily.   atorvastatin 40 MG tablet Commonly known as:  LIPITOR Take 40 mg by mouth daily.   finasteride 5 MG tablet Commonly known as:  PROSCAR Take 1 tablet (5 mg total) by mouth daily.   furosemide 20 MG tablet Commonly known as:  LASIX Take 20 mg by mouth daily.   hydrALAZINE 25 MG tablet Commonly known as:  APRESOLINE Take 25 mg by mouth 2 (two) times daily.   LORazepam 0.5 MG tablet Commonly known as:  ATIVAN Take 0.5 mg by mouth every 8 (eight) hours.   losartan 100 MG  tablet Commonly known as:  COZAAR Take 50 mg by mouth daily.   tamsulosin 0.4 MG Caps capsule Commonly known as:  FLOMAX Take 1 capsule (0.4 mg total) by mouth daily.       Allergies: No Known Allergies  Family History: Family History  Problem Relation Age of Onset  . Cancer Brother        2000  . Prostate cancer Neg Hx   . Bladder Cancer Neg Hx   . Kidney cancer Neg Hx     Social History:  reports that he quit smoking about 19 years ago. He has quit using smokeless tobacco. He reports that he drinks alcohol. He reports that he does not use drugs.  ROS: UROLOGY Frequent Urination?: No Hard to postpone urination?: No Burning/pain  with urination?: No Get up at night to urinate?: No Leakage of urine?: No Urine stream starts and stops?: No Trouble starting stream?: No Do you have to strain to urinate?: No Blood in urine?: No Urinary tract infection?: No Sexually transmitted disease?: No Injury to kidneys or bladder?: No Painful intercourse?: No Weak stream?: No Erection problems?: No Penile pain?: No  Gastrointestinal Nausea?: No Vomiting?: No Indigestion/heartburn?: No Diarrhea?: No Constipation?: No  Constitutional Fever: No Night sweats?: No Weight loss?: No Fatigue?: No  Skin Skin rash/lesions?: No Itching?: No  Eyes Blurred vision?: No Double vision?: No  Ears/Nose/Throat Sore throat?: No Sinus problems?: No  Hematologic/Lymphatic Swollen glands?: No Easy bruising?: No  Cardiovascular Leg swelling?: No Chest pain?: No  Respiratory Cough?: No Shortness of breath?: No  Endocrine Excessive thirst?: No  Musculoskeletal Back pain?: No Joint pain?: No  Neurological Headaches?: No Dizziness?: No  Psychologic Depression?: No Anxiety?: No  Physical Exam: BP 125/71 (BP Location: Left Arm, Patient Position: Sitting, Cuff Size: Normal)   Pulse 94   Ht 5\' 3"  (1.6 m)   Wt 171 lb 1.6 oz (77.6 kg)   BMI 30.31 kg/m   Constitutional: Well nourished. Alert and oriented, No acute distress. HEENT: [place] AT, moist mucus membranes. Trachea midline, no masses. Cardiovascular: No clubbing, cyanosis, or edema. Respiratory: Normal respiratory effort, no increased work of breathing. GI: Abdomen is soft, non tender, non distended, no abdominal masses. Liver and spleen not palpable.  No hernias appreciated.  Stool sample for occult testing is not indicated.   GU: No CVA tenderness.  No bladder fullness or masses.  Patient with uncircumcised phallus.  Foreskin easily retracted  Urethral meatus is patent.  No penile discharge. No penile lesions or rashes. Scrotum without lesions, cysts, rashes  and/or edema.  Testicles are located scrotally bilaterally. No masses are appreciated in the testicles. Left and right epididymis are normal. Rectal: Patient with  normal sphincter tone. Anus and perineum without scarring or rashes. No rectal masses are appreciated. Prostate is approximately 45 grams, firm.   Skin: No rashes, bruises or suspicious lesions. Lymph: No cervical or inguinal adenopathy. Neurologic: Grossly intact, no focal deficits, moving all 4 extremities. Psychiatric: Normal mood and affect.   Laboratory Data: Lab Results  Component Value Date   WBC 3.9 09/10/2017   HGB 10.5 (L) 09/10/2017   HCT 32.1 (L) 09/10/2017   MCV 93.8 09/10/2017   PLT 134 (L) 09/10/2017    Lab Results  Component Value Date   CREATININE 1.10 04/09/2017    No results found for: PSA  No results found for: TESTOSTERONE  No results found for: HGBA1C  No results found for: TSH  No results found for: CHOL, HDL,  CHOLHDL, VLDL, LDLCALC  No results found for: AST No results found for: ALT No components found for: ALKALINEPHOPHATASE No components found for: BILIRUBINTOTAL  No results found for: ESTRADIOL  Urinalysis    Component Value Date/Time   APPEARANCEUR Clear 07/15/2017 1457   GLUCOSEU Negative 07/15/2017 1457   BILIRUBINUR Negative 07/15/2017 [place] 07/15/2017 1457   NITRITE Negative 07/15/2017 1457   LEUKOCYTESUR Negative 07/15/2017 1457    I have reviewed the labs.  Assessment & Plan:    1. Prostate cancer Gleason 8 (4+4) adenocarcinoma iPSA 12.7 - treated with radiation Current PSA 0.01 F/U in 3 months for 6 month Lupron      Return in about 3 months (around 06/18/2018) for Lupron and office visit .  These notes generated with voice recognition software. I apologize for typographical errors.  Zara Council, PA-C  [facility] [address]  [place], [place] [address] [phone]

## 2018-04-18 DIAGNOSIS — J019 Acute sinusitis, unspecified: Secondary | ICD-10-CM | POA: Diagnosis not present

## 2018-04-18 DIAGNOSIS — R05 Cough: Secondary | ICD-10-CM | POA: Diagnosis not present

## 2018-05-11 DIAGNOSIS — J019 Acute sinusitis, unspecified: Secondary | ICD-10-CM | POA: Diagnosis not present

## 2018-06-03 DIAGNOSIS — I2510 Atherosclerotic heart disease of native coronary artery without angina pectoris: Secondary | ICD-10-CM | POA: Diagnosis not present

## 2018-06-03 DIAGNOSIS — I10 Essential (primary) hypertension: Secondary | ICD-10-CM | POA: Diagnosis not present

## 2018-06-03 DIAGNOSIS — I714 Abdominal aortic aneurysm, without rupture: Secondary | ICD-10-CM | POA: Diagnosis not present

## 2018-06-03 DIAGNOSIS — R9431 Abnormal electrocardiogram [ECG] [EKG]: Secondary | ICD-10-CM | POA: Diagnosis not present

## 2018-06-03 DIAGNOSIS — E782 Mixed hyperlipidemia: Secondary | ICD-10-CM | POA: Diagnosis not present

## 2018-06-28 NOTE — Progress Notes (Signed)
07/01/2018  3:03 PM   Ruben Green 08-24-1933 [id number]  Referring provider: Tracie Harrier, MD [address] [facility] [place], [place] [address]  Chief Complaint  Patient presents with  . Prostate Cancer    HPI: Ruben Green is a 85 y.o. male [place] with a history of prostate cancer s/p radiation therapy who presents today for Lupron injection and office visit.  Completed radiation therapy to his prostate and pelvic nodes in 09/2017 for Gleason's 8 (4+4) adenocarcinoma.  iPSA 12.7.  His last Lupron was given on 03/18/2018.    His recent PSA was 0.01 in 01/2018.    Reiterated the importance of calcium and Vitamin D on this visit.  Would want his wife present in the future to interpret for him as his English is limited.   He states he is having good energy and does not complain of hot flashes.    PMH: Past Medical History:  Diagnosis Date  . Anxiety   . BPH with obstruction/lower urinary tract symptoms   . Chronic bronchitis ([place])   . COPD (chronic obstructive pulmonary disease) ([place])   . Elevated PSA   . Environmental and seasonal allergies   . HLD (hyperlipidemia)   . HOH (hard of hearing)   . HTN (hypertension)   . Incomplete bladder emptying   . Macular degeneration     Surgical History: Past Surgical History:  Procedure Laterality Date  . ABDOMINAL AORTIC ANEURYSM REPAIR    . CATARACT EXTRACTION W/PHACO Right 09/25/2016   Procedure: CATARACT EXTRACTION PHACO AND INTRAOCULAR LENS PLACEMENT (IOC);  Surgeon: Birder Robson, MD;  Location: [facility] ORS;  Service: Ophthalmology;  Laterality: Right;  [place] 01:01 AP% 17.4 CDE 10.75 Fluid pack lot # [id number]  . CATARACT EXTRACTION W/PHACO Left 10/16/2016   Procedure: CATARACT EXTRACTION PHACO AND INTRAOCULAR LENS PLACEMENT ([place]) Suture placed in Left eye;  Surgeon: Birder Robson, MD;  Location: [facility] ORS;  Service: Ophthalmology;  Laterality: Left;  [place] 2:06.8 AP% 22.2 CDE 28.17 Fluid  pack lot # [id number]  . HERNIA REPAIR     x 2  . INGUINAL HERNIA REPAIR Right 12/02/2015   Procedure: HERNIA REPAIR INGUINAL ADULT;  Surgeon: Leonie Green, MD;  Location: [facility] ORS;  Service: General;  Laterality: Right;  . PROSTATE SURGERY  2012  . Right Carotid Endarterectomy      Home Medications:  Allergies as of 07/01/2018   No Known Allergies     Medication List       Accurate as of July 01, 2018  3:03 PM. Always use your most recent med list.        albuterol 108 (90 Base) MCG/ACT inhaler Commonly known as:  PROVENTIL HFA;VENTOLIN HFA Inhale 2 puffs into the lungs every 6 (six) hours as needed for wheezing or shortness of breath.   albuterol-ipratropium 18-103 MCG/ACT inhaler Commonly known as:  COMBIVENT Inhale 1-2 puffs into the lungs every 4 (four) hours.   amLODipine 10 MG tablet Commonly known as:  NORVASC Take 10 mg by mouth daily.   atorvastatin 40 MG tablet Commonly known as:  LIPITOR Take 40 mg by mouth daily.   finasteride 5 MG tablet Commonly known as:  PROSCAR Take 1 tablet (5 mg total) by mouth daily.   furosemide 20 MG tablet Commonly known as:  LASIX Take 20 mg by mouth daily.   hydrALAZINE 25 MG tablet Commonly known as:  APRESOLINE Take 25 mg by mouth 2 (two) times daily.   LORazepam  0.5 MG tablet Commonly known as:  ATIVAN Take 0.5 mg by mouth every 8 (eight) hours.   losartan 100 MG tablet Commonly known as:  COZAAR Take 50 mg by mouth daily.   tamsulosin 0.4 MG Caps capsule Commonly known as:  FLOMAX Take 1 capsule (0.4 mg total) by mouth daily.       Allergies: No Known Allergies  Family History: Family History  Problem Relation Age of Onset  . Cancer Brother        2000  . Prostate cancer Neg Hx   . Bladder Cancer Neg Hx   . Kidney cancer Neg Hx     Social History:  reports that he quit smoking about 19 years ago. He has quit using smokeless tobacco. He reports current alcohol use. He reports that he does  not use drugs.  ROS: UROLOGY Frequent Urination?: No Hard to postpone urination?: No Burning/pain with urination?: No Get up at night to urinate?: No Leakage of urine?: No Urine stream starts and stops?: No Trouble starting stream?: No Do you have to strain to urinate?: No Blood in urine?: No Urinary tract infection?: No Sexually transmitted disease?: No Injury to kidneys or bladder?: No Painful intercourse?: No Weak stream?: No Erection problems?: No Penile pain?: No  Gastrointestinal Nausea?: No Vomiting?: No Indigestion/heartburn?: No Diarrhea?: No Constipation?: No  Constitutional Fever: No Night sweats?: No Weight loss?: No Fatigue?: No  Skin Skin rash/lesions?: No Itching?: No  Eyes Blurred vision?: No Double vision?: No  Ears/Nose/Throat Sore throat?: No Sinus problems?: No  Hematologic/Lymphatic Swollen glands?: No Easy bruising?: No  Cardiovascular Leg swelling?: No Chest pain?: No  Respiratory Cough?: No Shortness of breath?: No  Endocrine Excessive thirst?: No  Musculoskeletal Back pain?: No Joint pain?: No  Neurological Headaches?: No Dizziness?: No  Psychologic Depression?: No Anxiety?: No  Physical Exam: BP (!) 163/78   Pulse 87   Ht 5\' 3"  (1.6 m)   Wt 178 lb 1.6 oz (80.8 kg)   SpO2 97%   BMI 31.55 kg/m   Constitutional:  Well nourished. Alert and oriented. No acute distress. Cardiovascular: No clubbing, cyanosis, or edema. Respiratory: Normal respiratory effort, no increased work of breathing. Skin: No rashes, bruises or suspicious lesions. Neurologic: Grossly intact, no focal deficits, moving all 4 extremities. Psychiatric: Normal mood and affect.  Laboratory Data: Lab Results  Component Value Date   WBC 3.9 09/10/2017   HGB 10.5 (L) 09/10/2017   HCT 32.1 (L) 09/10/2017   MCV 93.8 09/10/2017   PLT 134 (L) 09/10/2017    Lab Results  Component Value Date   CREATININE 1.10 04/09/2017    No results  found for: PSA  No results found for: TESTOSTERONE  No results found for: HGBA1C  No results found for: TSH  No results found for: CHOL, HDL, CHOLHDL, VLDL, LDLCALC  No results found for: AST No results found for: ALT No components found for: ALKALINEPHOPHATASE No components found for: BILIRUBINTOTAL  No results found for: ESTRADIOL  Urinalysis    Component Value Date/Time   APPEARANCEUR Clear 07/15/2017 1457   GLUCOSEU Negative 07/15/2017 1457   BILIRUBINUR Negative 07/15/2017 [place] 07/15/2017 1457   NITRITE Negative 07/15/2017 1457   LEUKOCYTESUR Negative 07/15/2017 1457    I have reviewed the labs.  Assessment & Plan:    1. Prostate cancer - Gleason 8 (4+4) adenocarcinoma iPSA 12.7 - treated with radiation and ADT - Current PSA 0.01 - 6 months Lupron given today -PSA drawn  today  - F/U in 6 months for 6 month Lupron and PSA - Try to ensure his wife is present to interpret for him  Return in about 6 months (around 12/30/2018) for PSA and LUPRON injection .  These notes generated with voice recognition software. I apologize for typographical errors.  [facility] [address]  [place], [place] [address] [phone]  I, Adele Schilder, am acting as a Education administrator for [employer], PA-C.   I have reviewed the above documentation for accuracy and completeness, and I agree with the above.    Zara Council, PA-C

## 2018-07-01 ENCOUNTER — Encounter: Payer: Self-pay | Admitting: Urology

## 2018-07-01 ENCOUNTER — Ambulatory Visit: Payer: PPO | Admitting: Urology

## 2018-07-01 ENCOUNTER — Other Ambulatory Visit: Payer: Self-pay

## 2018-07-01 VITALS — BP 163/78 | HR 87 | Ht 63.0 in | Wt 178.1 lb

## 2018-07-01 DIAGNOSIS — C61 Malignant neoplasm of prostate: Secondary | ICD-10-CM | POA: Diagnosis not present

## 2018-07-01 MED ORDER — LEUPROLIDE ACETATE (6 MONTH) 45 MG IM KIT
45.0000 mg | PACK | Freq: Once | INTRAMUSCULAR | Status: AC
  Administered 2018-07-01: 45 mg via INTRAMUSCULAR

## 2018-07-01 NOTE — Progress Notes (Signed)
Lupron IM Injection   Due to Prostate Cancer patient is present today for a Lupron Injection.  Medication: Lupron 6 month Dose: 45 mg  Location: right upper outer buttocks Lot: [id number] Exp: 05/10/2020  Patient tolerated well, no complications were noted  Performed by: Elberta Leatherwood, CMA  Follow up: 6 months

## 2018-07-02 ENCOUNTER — Telehealth: Payer: Self-pay

## 2018-07-02 LAB — PSA: Prostate Specific Ag, Serum: <0.1 ng/mL (ref 0.0–4.0)

## 2018-07-02 NOTE — Telephone Encounter (Signed)
-----   Message from Nori Riis, PA-C sent at 07/02/2018  7:46 AM EST ----- Please let Mr. [place] know that his PSA is undetectable.  We will see him in six months.

## 2018-07-02 NOTE — Telephone Encounter (Signed)
That would be fine for him to come in for his Lupron injection after his vacation.

## 2018-07-02 NOTE — Telephone Encounter (Signed)
Informed patient of lab results.  He will be going out of the country [place]20-8/15 and is scheduled for Lupron 7/28. They want to know if it will be ok for him to come once they return to the states.  Please advise.

## 2018-07-03 NOTE — Telephone Encounter (Signed)
Patient will call when they have an exact return date to schedule follow up.

## 2018-07-09 DIAGNOSIS — E782 Mixed hyperlipidemia: Secondary | ICD-10-CM | POA: Diagnosis not present

## 2018-07-09 DIAGNOSIS — I2510 Atherosclerotic heart disease of native coronary artery without angina pectoris: Secondary | ICD-10-CM | POA: Diagnosis not present

## 2018-07-09 DIAGNOSIS — D649 Anemia, unspecified: Secondary | ICD-10-CM | POA: Diagnosis not present

## 2018-07-09 DIAGNOSIS — E538 Deficiency of other specified B group vitamins: Secondary | ICD-10-CM | POA: Diagnosis not present

## 2018-07-09 DIAGNOSIS — I10 Essential (primary) hypertension: Secondary | ICD-10-CM | POA: Diagnosis not present

## 2018-07-09 DIAGNOSIS — R739 Hyperglycemia, unspecified: Secondary | ICD-10-CM | POA: Diagnosis not present

## 2018-07-15 DIAGNOSIS — F411 Generalized anxiety disorder: Secondary | ICD-10-CM | POA: Diagnosis not present

## 2018-07-15 DIAGNOSIS — I2510 Atherosclerotic heart disease of native coronary artery without angina pectoris: Secondary | ICD-10-CM | POA: Diagnosis not present

## 2018-07-15 DIAGNOSIS — F419 Anxiety disorder, unspecified: Secondary | ICD-10-CM | POA: Diagnosis not present

## 2018-07-15 DIAGNOSIS — I10 Essential (primary) hypertension: Secondary | ICD-10-CM | POA: Diagnosis not present

## 2018-07-15 DIAGNOSIS — D649 Anemia, unspecified: Secondary | ICD-10-CM | POA: Diagnosis not present

## 2018-07-15 DIAGNOSIS — J42 Unspecified chronic bronchitis: Secondary | ICD-10-CM | POA: Diagnosis not present

## 2018-07-15 DIAGNOSIS — Z0000 Encounter for general adult medical examination without abnormal findings: Secondary | ICD-10-CM | POA: Diagnosis not present

## 2018-07-15 DIAGNOSIS — E782 Mixed hyperlipidemia: Secondary | ICD-10-CM | POA: Diagnosis not present

## 2018-08-05 DIAGNOSIS — H353212 Exudative age-related macular degeneration, right eye, with inactive choroidal neovascularization: Secondary | ICD-10-CM | POA: Diagnosis not present

## 2018-08-14 ENCOUNTER — Inpatient Hospital Stay: Payer: PPO | Attending: Radiation Oncology

## 2018-08-14 DIAGNOSIS — C61 Malignant neoplasm of prostate: Secondary | ICD-10-CM | POA: Insufficient documentation

## 2018-08-14 LAB — PSA: Prostatic Specific Antigen: 0.01 ng/mL (ref 0.00–4.00)

## 2018-08-21 ENCOUNTER — Encounter: Payer: Self-pay | Admitting: Radiation Oncology

## 2018-08-21 ENCOUNTER — Other Ambulatory Visit: Payer: Self-pay

## 2018-08-21 ENCOUNTER — Ambulatory Visit: Payer: PPO | Admitting: Radiation Oncology

## 2018-08-21 ENCOUNTER — Ambulatory Visit
Admission: RE | Admit: 2018-08-21 | Discharge: 2018-08-21 | Disposition: A | Discharge status: Home / Self Care | Payer: PPO | Source: Ambulatory Visit | Attending: Radiation Oncology | Admitting: Radiation Oncology

## 2018-08-21 DIAGNOSIS — C61 Malignant neoplasm of prostate: Secondary | ICD-10-CM

## 2018-08-21 NOTE — Progress Notes (Signed)
Radiation Oncology Follow up Note  Name: Ruben Green   Date:   08/21/2018 MRN:  [id number] DOB: 1934-04-10    This 85 y.o. male presents to the clinic today for ten-month follow-up status post radiation therapy to his prostate and pelvic nodes for stage IIB adenocarcinoma.  REFERRING PROVIDER: Tracie Harrier, MD  HPI: patient is an 85 year old male now out 10 months having completed IM RT radiation therapy to both his prostate and pelvic nodes for Gleason 8 (4+4) adenocarcinoma presenting the PSA of 12.7.Marland Kitchenhe is currently on Lupron suppression through Dr. Cherrie Gauze office. He has nocturia 2-3you problems with urgency or frequency no diarrhea. His most recent PSA was 0.01 stable from September.  COMPLICATIONS OF TREATMENT: none  FOLLOW UP COMPLIANCE: keeps appointments   PHYSICAL EXAM:  BP (!) (P) 179/82 (BP Location: Left Arm, Patient Position: Sitting)   Pulse (P) 76   Resp (P) 18   Wt (P) 178 lb 12.7 oz (81.1 kg)   BMI (P) 31.67 kg/m  Well-developed well-nourished patient in NAD. HEENT reveals PERLA, EOMI, discs not visualized.  Oral cavity is clear. No oral mucosal lesions are identified. Neck is clear without evidence of cervical or supraclavicular adenopathy. Lungs are clear to A&P. Cardiac examination is essentially unremarkable with regular rate and rhythm without murmur rub or thrill. Abdomen is benign with no organomegaly or masses noted. Motor sensory and DTR levels are equal and symmetric in the upper and lower extremities. Cranial nerves II through XII are grossly intact. Proprioception is intact. No peripheral adenopathy or edema is identified. No motor or sensory levels are noted. Crude visual fields are within normal range.  RADIOLOGY RESULTS: no current films for review  PLAN: present time patient is doing well under excellent biochemical control of his prostate cancer will check to make sure he is continuing his androgen deprivation therapy through Dr. Cherrie Gauze  office. Otherwise I've asked to see him back in a year with a PSA prior. Patient and family know to call sooner with any concerns.  I would like to take this opportunity to thank you for allowing me to participate in the care of your patient.Noreene Filbert, MD

## 2018-08-22 ENCOUNTER — Other Ambulatory Visit: Payer: Self-pay | Admitting: *Deleted

## 2018-12-22 DIAGNOSIS — E782 Mixed hyperlipidemia: Secondary | ICD-10-CM | POA: Diagnosis not present

## 2018-12-22 DIAGNOSIS — I10 Essential (primary) hypertension: Secondary | ICD-10-CM | POA: Diagnosis not present

## 2018-12-22 DIAGNOSIS — D649 Anemia, unspecified: Secondary | ICD-10-CM | POA: Diagnosis not present

## 2018-12-22 DIAGNOSIS — F419 Anxiety disorder, unspecified: Secondary | ICD-10-CM | POA: Diagnosis not present

## 2018-12-22 DIAGNOSIS — I2510 Atherosclerotic heart disease of native coronary artery without angina pectoris: Secondary | ICD-10-CM | POA: Diagnosis not present

## 2018-12-22 DIAGNOSIS — F411 Generalized anxiety disorder: Secondary | ICD-10-CM | POA: Diagnosis not present

## 2018-12-23 DIAGNOSIS — F411 Generalized anxiety disorder: Secondary | ICD-10-CM | POA: Diagnosis not present

## 2018-12-23 DIAGNOSIS — Z0000 Encounter for general adult medical examination without abnormal findings: Secondary | ICD-10-CM | POA: Diagnosis not present

## 2018-12-23 DIAGNOSIS — K625 Hemorrhage of anus and rectum: Secondary | ICD-10-CM | POA: Diagnosis not present

## 2018-12-23 DIAGNOSIS — I10 Essential (primary) hypertension: Secondary | ICD-10-CM | POA: Diagnosis not present

## 2018-12-23 DIAGNOSIS — J42 Unspecified chronic bronchitis: Secondary | ICD-10-CM | POA: Diagnosis not present

## 2018-12-23 DIAGNOSIS — D649 Anemia, unspecified: Secondary | ICD-10-CM | POA: Diagnosis not present

## 2019-01-13 ENCOUNTER — Ambulatory Visit: Payer: PPO | Admitting: Urology

## 2019-01-29 DIAGNOSIS — K625 Hemorrhage of anus and rectum: Secondary | ICD-10-CM | POA: Diagnosis not present

## 2019-02-09 ENCOUNTER — Other Ambulatory Visit: Payer: Self-pay

## 2019-02-09 ENCOUNTER — Other Ambulatory Visit: Payer: PPO

## 2019-02-09 DIAGNOSIS — C61 Malignant neoplasm of prostate: Secondary | ICD-10-CM

## 2019-02-10 LAB — PSA: Prostate Specific Ag, Serum: <0.1 ng/mL (ref 0.0–4.0)

## 2019-02-11 NOTE — Progress Notes (Signed)
07/01/2018  2:05 PM   [employer] [birth year] [id number]  Referring provider: Tracie Harrier, MD [address] [facility] [place],  [place] [address]  Chief Complaint  Patient presents with  . Follow-up    HPI: Ruben Green is a 85 y.o. male with a history of prostate cancer s/p radiation therapy who presents today for Lupron injection and office visit.  Completed radiation therapy to his prostate and pelvic nodes in 09/2017 for Gleason's 8 (4+4) adenocarcinoma.  iPSA 12.7.  His last Lupron was given on 07/01/2018.    His recent PSA was <0.1 in 01/2019.    He has not been taking his calcium or Vitamin D.  His wife is with him today and instructed him to start the vitamins.     He reports good energy, no weight loss, good appetite, no bone pain and minimal hot flashes.    PMH: Past Medical History:  Diagnosis Date  . Anxiety   . BPH with obstruction/lower urinary tract symptoms   . Chronic bronchitis ([place])   . COPD (chronic obstructive pulmonary disease) ([place])   . Elevated PSA   . Environmental and seasonal allergies   . HLD (hyperlipidemia)   . HOH (hard of hearing)   . HTN (hypertension)   . Incomplete bladder emptying   . Macular degeneration     Surgical History: Past Surgical History:  Procedure Laterality Date  . ABDOMINAL AORTIC ANEURYSM REPAIR    . CATARACT EXTRACTION W/PHACO Right 09/25/2016   Procedure: CATARACT EXTRACTION PHACO AND INTRAOCULAR LENS PLACEMENT (IOC);  Surgeon: Birder Robson, MD;  Location: [facility] ORS;  Service: Ophthalmology;  Laterality: Right;  [place] 01:01 AP% 17.4 CDE 10.75 Fluid pack lot # [id number]  . CATARACT EXTRACTION W/PHACO Left 10/16/2016   Procedure: CATARACT EXTRACTION PHACO AND INTRAOCULAR LENS PLACEMENT ([place]) Suture placed in Left eye;  Surgeon: Birder Robson, MD;  Location: [facility] ORS;  Service: Ophthalmology;  Laterality: Left;  [place] 2:06.8 AP% 22.2 CDE 28.17 Fluid pack lot # [id number]  .  HERNIA REPAIR     x 2  . INGUINAL HERNIA REPAIR Right 12/02/2015   Procedure: HERNIA REPAIR INGUINAL ADULT;  Surgeon: Leonie Green, MD;  Location: [facility] ORS;  Service: General;  Laterality: Right;  . PROSTATE SURGERY  2012  . Right Carotid Endarterectomy      Home Medications:  Allergies as of 02/12/2019   No Known Allergies     Medication List       Accurate as of February 12, 2019  2:05 PM. If you have any questions, ask your nurse or doctor.        albuterol 108 (90 Base) MCG/ACT inhaler Commonly known as: VENTOLIN HFA Inhale 2 puffs into the lungs every 6 (six) hours as needed for wheezing or shortness of breath.   albuterol-ipratropium 18-103 MCG/ACT inhaler Commonly known as: COMBIVENT Inhale 1-2 puffs into the lungs every 4 (four) hours.   amLODipine 10 MG tablet Commonly known as: NORVASC Take 10 mg by mouth daily.   atorvastatin 40 MG tablet Commonly known as: LIPITOR Take 40 mg by mouth daily.   finasteride 5 MG tablet Commonly known as: Proscar Take 1 tablet (5 mg total) by mouth daily.   furosemide 20 MG tablet Commonly known as: LASIX Take 20 mg by mouth daily.   hydrALAZINE 25 MG tablet Commonly known as: APRESOLINE Take 25 mg by mouth 2 (two) times daily.   LORazepam 0.5 MG tablet Commonly known as: ATIVAN  Take 0.5 mg by mouth every 8 (eight) hours.   losartan 100 MG tablet Commonly known as: COZAAR Take 50 mg by mouth daily.   tamsulosin 0.4 MG Caps capsule Commonly known as: FLOMAX Take 1 capsule (0.4 mg total) by mouth daily.       Allergies: No Known Allergies  Family History: Family History  Problem Relation Age of Onset  . Cancer Brother        2000  . Prostate cancer Neg Hx   . Bladder Cancer Neg Hx   . Kidney cancer Neg Hx     Social History:  reports that he quit smoking about 20 years ago. He has quit using smokeless tobacco. He reports current alcohol use. He reports that he does not use drugs.  ROS: UROLOGY  Frequent Urination?: No Hard to postpone urination?: No Burning/pain with urination?: No Get up at night to urinate?: No Leakage of urine?: No Urine stream starts and stops?: No Trouble starting stream?: No Do you have to strain to urinate?: No Blood in urine?: No Urinary tract infection?: No Sexually transmitted disease?: No Injury to kidneys or bladder?: No Painful intercourse?: No Weak stream?: No Erection problems?: No Penile pain?: No  Gastrointestinal Nausea?: No Vomiting?: No Indigestion/heartburn?: No Diarrhea?: No Constipation?: No  Constitutional Fever: No Night sweats?: No Weight loss?: No Fatigue?: No  Skin Skin rash/lesions?: No Itching?: No  Eyes Blurred vision?: No Double vision?: No  Ears/Nose/Throat Sore throat?: No Sinus problems?: No  Hematologic/Lymphatic Swollen glands?: No Easy bruising?: No  Cardiovascular Leg swelling?: No Chest pain?: No  Respiratory Cough?: No Shortness of breath?: No  Endocrine Excessive thirst?: No  Musculoskeletal Back pain?: No Joint pain?: No  Neurological Headaches?: No Dizziness?: No  Psychologic Depression?: No Anxiety?: No  Physical Exam: BP 129/77   Pulse 80   Ht 5\' 7"  (1.702 m)   Wt 172 lb (78 kg)   BMI 26.94 kg/m   Constitutional:  Well nourished. Alert and oriented, No acute distress. HEENT: [place] AT, moist mucus membranes.  Trachea midline, no masses. Cardiovascular: No clubbing, cyanosis, or edema. Respiratory: Normal respiratory effort, no increased work of breathing. Neurologic: Grossly intact, no focal deficits, moving all 4 extremities. Psychiatric: Normal mood and affect.   Laboratory Data: Lab Results  Component Value Date   WBC 3.9 09/10/2017   HGB 10.5 (L) 09/10/2017   HCT 32.1 (L) 09/10/2017   MCV 93.8 09/10/2017   PLT 134 (L) 09/10/2017    Lab Results  Component Value Date   CREATININE 1.10 04/09/2017    No results found for: PSA  No results found for:  TESTOSTERONE  No results found for: HGBA1C  No results found for: TSH  No results found for: CHOL, HDL, CHOLHDL, VLDL, LDLCALC  No results found for: AST No results found for: ALT No components found for: ALKALINEPHOPHATASE No components found for: BILIRUBINTOTAL  No results found for: ESTRADIOL  Urinalysis    Component Value Date/Time   APPEARANCEUR Clear 07/15/2017 1457   GLUCOSEU Negative 07/15/2017 1457   BILIRUBINUR Negative 07/15/2017 [place] 07/15/2017 1457   NITRITE Negative 07/15/2017 1457   LEUKOCYTESUR Negative 07/15/2017 1457    I have reviewed the labs.  Assessment & Plan:    1. Prostate cancer - Gleason 8 (4+4) adenocarcinoma iPSA 12.7 - treated with radiation and ADT - Current PSA <0.1  - 6 months Lupron given today - F/U in 6 months for 6 month ADT and PSA - Ensure  his wife is present to interpret for him  Return in about 6 months (around 08/15/2019) for PSA and ADT.  These notes generated with voice recognition software. I apologize for typographical errors.  Zara Council, PA-C  [facility] [address]  [place], [place] [address] [phone]

## 2019-02-12 ENCOUNTER — Encounter: Payer: Self-pay | Admitting: Urology

## 2019-02-12 ENCOUNTER — Telehealth: Payer: Self-pay | Admitting: Urology

## 2019-02-12 ENCOUNTER — Ambulatory Visit: Payer: PPO | Admitting: Urology

## 2019-02-12 ENCOUNTER — Other Ambulatory Visit: Payer: Self-pay

## 2019-02-12 VITALS — BP 129/77 | HR 80 | Ht 67.0 in | Wt 172.0 lb

## 2019-02-12 DIAGNOSIS — C61 Malignant neoplasm of prostate: Secondary | ICD-10-CM | POA: Diagnosis not present

## 2019-02-12 MED ORDER — LEUPROLIDE ACETATE (6 MONTH) 45 MG IM KIT
45.0000 mg | PACK | Freq: Once | INTRAMUSCULAR | Status: AC
  Administered 2019-02-12: 45 mg via INTRAMUSCULAR

## 2019-02-12 NOTE — Addendum Note (Signed)
Addended by: Kyra Manges on: 02/12/2019 02:27 PM   Modules accepted: Orders

## 2019-02-12 NOTE — Telephone Encounter (Signed)
NO PA FOR LUPRON  02-12-19 MICHELLE

## 2019-02-12 NOTE — Patient Instructions (Signed)
Calcium 600 mg twice daily with food and Vitamin D3 400 IU daily

## 2019-02-12 NOTE — Progress Notes (Signed)
Lupron IM Injection   Due to Prostate Cancer patient is present today for a Lupron Injection.  Medication: Lupron 6 month Dose: 45 mg  Location: left upper outer buttocks Lot: [id number] Exp: 01/27/2021  Patient tolerated well, no complications were noted  Performed by: Elberta Leatherwood, CMA  Follow up: 6 months

## 2019-02-17 DIAGNOSIS — H353212 Exudative age-related macular degeneration, right eye, with inactive choroidal neovascularization: Secondary | ICD-10-CM | POA: Diagnosis not present

## 2019-03-27 ENCOUNTER — Other Ambulatory Visit
Admission: RE | Admit: 2019-03-27 | Discharge: 2019-03-27 | Disposition: A | Discharge status: Home / Self Care | Payer: PPO | Source: Ambulatory Visit | Attending: Gastroenterology | Admitting: Gastroenterology

## 2019-03-27 DIAGNOSIS — Z01812 Encounter for preprocedural laboratory examination: Secondary | ICD-10-CM | POA: Diagnosis not present

## 2019-03-27 DIAGNOSIS — Z20828 Contact with and (suspected) exposure to other viral communicable diseases: Secondary | ICD-10-CM | POA: Insufficient documentation

## 2019-03-27 LAB — SARS CORONAVIRUS 2 (TAT 6-24 HRS): SARS Coronavirus 2: NEGATIVE

## 2019-03-31 ENCOUNTER — Encounter
Admission: RE | Disposition: A | Discharge status: Home / Self Care | Payer: Self-pay | Source: Home / Self Care | Attending: Gastroenterology

## 2019-03-31 ENCOUNTER — Ambulatory Visit: Payer: PPO | Admitting: Anesthesiology

## 2019-03-31 ENCOUNTER — Ambulatory Visit
Admission: RE | Admit: 2019-03-31 | Discharge: 2019-03-31 | Disposition: A | Discharge status: Home / Self Care | Payer: PPO | Attending: Gastroenterology | Admitting: Gastroenterology

## 2019-03-31 ENCOUNTER — Encounter: Payer: Self-pay | Admitting: Anesthesiology

## 2019-03-31 DIAGNOSIS — H9190 Unspecified hearing loss, unspecified ear: Secondary | ICD-10-CM | POA: Diagnosis not present

## 2019-03-31 DIAGNOSIS — I10 Essential (primary) hypertension: Secondary | ICD-10-CM | POA: Diagnosis not present

## 2019-03-31 DIAGNOSIS — K635 Polyp of colon: Secondary | ICD-10-CM | POA: Diagnosis not present

## 2019-03-31 DIAGNOSIS — Z79899 Other long term (current) drug therapy: Secondary | ICD-10-CM | POA: Diagnosis not present

## 2019-03-31 DIAGNOSIS — Z8546 Personal history of malignant neoplasm of prostate: Secondary | ICD-10-CM | POA: Diagnosis not present

## 2019-03-31 DIAGNOSIS — Z923 Personal history of irradiation: Secondary | ICD-10-CM | POA: Insufficient documentation

## 2019-03-31 DIAGNOSIS — D124 Benign neoplasm of descending colon: Secondary | ICD-10-CM | POA: Insufficient documentation

## 2019-03-31 DIAGNOSIS — Z7689 Persons encountering health services in other specified circumstances: Secondary | ICD-10-CM | POA: Diagnosis not present

## 2019-03-31 DIAGNOSIS — K5730 Diverticulosis of large intestine without perforation or abscess without bleeding: Secondary | ICD-10-CM | POA: Diagnosis not present

## 2019-03-31 DIAGNOSIS — J449 Chronic obstructive pulmonary disease, unspecified: Secondary | ICD-10-CM | POA: Diagnosis not present

## 2019-03-31 DIAGNOSIS — K625 Hemorrhage of anus and rectum: Secondary | ICD-10-CM | POA: Diagnosis not present

## 2019-03-31 DIAGNOSIS — K6289 Other specified diseases of anus and rectum: Secondary | ICD-10-CM | POA: Diagnosis not present

## 2019-03-31 DIAGNOSIS — Z7982 Long term (current) use of aspirin: Secondary | ICD-10-CM | POA: Diagnosis not present

## 2019-03-31 DIAGNOSIS — I2510 Atherosclerotic heart disease of native coronary artery without angina pectoris: Secondary | ICD-10-CM | POA: Insufficient documentation

## 2019-03-31 DIAGNOSIS — K5790 Diverticulosis of intestine, part unspecified, without perforation or abscess without bleeding: Secondary | ICD-10-CM | POA: Diagnosis not present

## 2019-03-31 DIAGNOSIS — F419 Anxiety disorder, unspecified: Secondary | ICD-10-CM | POA: Insufficient documentation

## 2019-03-31 DIAGNOSIS — M79673 Pain in unspecified foot: Secondary | ICD-10-CM | POA: Insufficient documentation

## 2019-03-31 DIAGNOSIS — D122 Benign neoplasm of ascending colon: Secondary | ICD-10-CM | POA: Insufficient documentation

## 2019-03-31 DIAGNOSIS — N401 Enlarged prostate with lower urinary tract symptoms: Secondary | ICD-10-CM | POA: Insufficient documentation

## 2019-03-31 DIAGNOSIS — K5521 Angiodysplasia of colon with hemorrhage: Secondary | ICD-10-CM | POA: Diagnosis not present

## 2019-03-31 DIAGNOSIS — N138 Other obstructive and reflux uropathy: Secondary | ICD-10-CM | POA: Insufficient documentation

## 2019-03-31 DIAGNOSIS — E785 Hyperlipidemia, unspecified: Secondary | ICD-10-CM | POA: Insufficient documentation

## 2019-03-31 DIAGNOSIS — Z87891 Personal history of nicotine dependence: Secondary | ICD-10-CM | POA: Insufficient documentation

## 2019-03-31 DIAGNOSIS — K627 Radiation proctitis: Secondary | ICD-10-CM | POA: Diagnosis not present

## 2019-03-31 HISTORY — PX: COLONOSCOPY WITH PROPOFOL: SHX5780

## 2019-03-31 SURGERY — COLONOSCOPY WITH PROPOFOL
Anesthesia: General

## 2019-03-31 MED ORDER — PHENYLEPHRINE HCL (PRESSORS) 10 MG/ML IV SOLN
INTRAVENOUS | Status: AC
  Filled 2019-03-31: qty 1

## 2019-03-31 MED ORDER — PROPOFOL 500 MG/50ML IV EMUL
INTRAVENOUS | Status: AC
  Filled 2019-03-31: qty 50

## 2019-03-31 MED ORDER — PROPOFOL 500 MG/50ML IV EMUL
INTRAVENOUS | Status: DC | PRN
  Administered 2019-03-31: 100 ug/kg/min via INTRAVENOUS

## 2019-03-31 MED ORDER — SODIUM CHLORIDE 0.9 % IV SOLN
INTRAVENOUS | Status: DC
  Administered 2019-03-31: 1000 mL via INTRAVENOUS

## 2019-03-31 MED ORDER — PHENYLEPHRINE HCL (PRESSORS) 10 MG/ML IV SOLN
INTRAVENOUS | Status: DC | PRN
  Administered 2019-03-31: 50 ug via INTRAVENOUS

## 2019-03-31 MED ORDER — LIDOCAINE HCL (PF) 2 % IJ SOLN
INTRAMUSCULAR | Status: AC
  Filled 2019-03-31: qty 10

## 2019-03-31 NOTE — Anesthesia Postprocedure Evaluation (Signed)
Anesthesia Post Note  Patient: Ruben Green  Procedure(s) Performed: COLONOSCOPY WITH PROPOFOL (N/A )  Patient location during evaluation: Endoscopy Anesthesia Type: General Level of consciousness: awake and alert and oriented Pain management: pain level controlled Vital Signs Assessment: post-procedure vital signs reviewed and stable Respiratory status: spontaneous breathing, nonlabored ventilation and respiratory function stable Cardiovascular status: blood pressure returned to baseline and stable Postop Assessment: no signs of nausea or vomiting Anesthetic complications: no     Last Vitals:  Vitals:   03/31/19 1250 03/31/19 1300  BP: (!) 173/92 (!) 173/99  Pulse: 76 76  Resp: 20 20  Temp:    SpO2: 99% 99%    Last Pain:  Vitals:   03/31/19 1300  TempSrc:   PainSc: 0-No pain                 Ambika Zettlemoyer

## 2019-03-31 NOTE — Transfer of Care (Signed)
Immediate Anesthesia Transfer of Care Note  Patient: Ruben Green  Procedure(s) Performed: COLONOSCOPY WITH PROPOFOL (N/A )  Patient Location: PACU  Anesthesia Type:General  Level of Consciousness: awake and sedated  Airway & Oxygen Therapy: Patient Spontanous Breathing and Patient connected to nasal cannula oxygen  Post-op Assessment: Report given to RN and Post -op Vital signs reviewed and stable  Post vital signs: Reviewed and stable  Last Vitals:  Vitals Value Taken Time  BP    Temp    Pulse    Resp    SpO2      Last Pain:  Vitals:   03/31/19 1053  TempSrc: Tympanic         Complications: No apparent anesthesia complications

## 2019-03-31 NOTE — H&P (Signed)
Outpatient short stay form Pre-procedure 03/31/2019 11:51 AM Ruben Sails MD  Primary Physician: Dr Tracie Harrier  Reason for visit: Colonoscopy.  I have discussed this case with patient and patient's wife as well as using a audio interpreter for the patient.  History of present illness: Patient is a 85 year old male presenting today for colonoscopy in regards to rectal bleeding.  His last colonoscopy was 12/25/2005 - for polyps at that time.  Since then about a year ago he underwent some radiation treatment for prostate cancer.  He began to have some rectal bleeding about 2 maybe 3 months ago with a bowel movement this tends to be bright red in nature.  Is enough to change the color of toilet bowl.  He has no pain with a bowel movement.  He tolerated his prep well.  He takes daily BC powders for foot pain.  He states he held those for the past 4 to 5 days.  He also takes a daily 81 mg aspirin that has been held as well.  He denies use of any other aspirin product or blood thinning agent.        Current Facility-Administered Medications:  .  0.9 %  sodium chloride infusion, , Intravenous, Continuous, Ruben Sails, MD, Last Rate: 20 mL/hr at 03/31/19 1135, 1,000 mL at 03/31/19 1135  Medications Prior to Admission  Medication Sig Dispense Refill Last Dose  . albuterol (PROVENTIL HFA;VENTOLIN HFA) 108 (90 Base) MCG/ACT inhaler Inhale 2 puffs into the lungs every 6 (six) hours as needed for wheezing or shortness of breath. 1 Inhaler 2 Past Month at Unknown time  . amLODipine (NORVASC) 10 MG tablet Take 10 mg by mouth daily.    Past Week at Unknown time  . aspirin EC 81 MG tablet Take 81 mg by mouth daily.   03/24/2019  . atorvastatin (LIPITOR) 40 MG tablet Take 40 mg by mouth daily.   Past Week at Unknown time  . furosemide (LASIX) 20 MG tablet Take 20 mg by mouth daily.    Past Week at Unknown time  . gabapentin (NEURONTIN) 100 MG capsule Take 100 mg by mouth at bedtime.     .  hydrALAZINE (APRESOLINE) 25 MG tablet Take 25 mg by mouth 2 (two) times daily.   Past Week at Unknown time  . losartan (COZAAR) 100 MG tablet Take 50 mg by mouth daily.    03/31/2019 at Unknown time  . albuterol-ipratropium (COMBIVENT) 18-103 MCG/ACT inhaler Inhale 1-2 puffs into the lungs every 4 (four) hours.     . finasteride (PROSCAR) 5 MG tablet Take 1 tablet (5 mg total) by mouth daily. 90 tablet 3   . LORazepam (ATIVAN) 0.5 MG tablet Take 0.5 mg by mouth every 8 (eight) hours.     . tamsulosin (FLOMAX) 0.4 MG CAPS capsule Take 1 capsule (0.4 mg total) by mouth daily. 30 capsule 11      No Known Allergies   Past Medical History:  Diagnosis Date  . Anxiety   . BPH with obstruction/lower urinary tract symptoms   . Chronic bronchitis ([place])   . COPD (chronic obstructive pulmonary disease) ([place])   . Elevated PSA   . Environmental and seasonal allergies   . HLD (hyperlipidemia)   . HOH (hard of hearing)   . HTN (hypertension)   . Incomplete bladder emptying   . Macular degeneration     Review of systems:      Physical Exam    Heart and lungs: Regular  rate and rhythm without rub or gallop lungs are bilaterally clear    HEENT: Normocephalic atraumatic eyes are anicteric    Other:    Pertinant exam for procedure: Soft nontender nondistended bowel sounds positive normoactive    Planned proceedures: Colonoscopy and indicated procedures.  I have discussed the risk benefits complications of this procedure to include not limited to bleeding infection perforation and the risk of sedation with both the patient and his wife and they wish to proceed.    Ruben Sails, MD Gastroenterology 03/31/2019  11:51 AM

## 2019-03-31 NOTE — Anesthesia Preprocedure Evaluation (Signed)
Anesthesia Evaluation  Patient identified by MRN, date of birth, ID band Patient awake    Reviewed: Allergy & Precautions, NPO status , Patient's Chart, lab work & pertinent test results  History of Anesthesia Complications Negative for: history of anesthetic complications  Airway Mallampati: II  TM Distance: >3 FB Neck ROM: Full    Dental  (+) Poor Dentition   Pulmonary neg sleep apnea, COPD, former smoker,    breath sounds clear to auscultation- rhonchi (-) wheezing      Cardiovascular hypertension, + CAD  (-) Past MI, (-) Cardiac Stents and (-) CABG  Rhythm:Regular Rate:Normal - Systolic murmurs and - Diastolic murmurs    Neuro/Psych neg Seizures PSYCHIATRIC DISORDERS Anxiety negative neurological ROS     GI/Hepatic negative GI ROS, Neg liver ROS,   Endo/Other  neg diabetes  Renal/GU negative Renal ROS     Musculoskeletal negative musculoskeletal ROS (+)   Abdominal (+) - obese,   Peds  Hematology negative hematology ROS (+)   Anesthesia Other Findings Past Medical History: No date: Anxiety No date: BPH with obstruction/lower urinary tract symptoms No date: Chronic bronchitis ([facility]) No date: COPD (chronic obstructive pulmonary disease) ([facility]) No date: Elevated PSA No date: Environmental and seasonal allergies No date: HLD (hyperlipidemia) No date: HOH (hard of hearing) No date: HTN (hypertension) No date: Incomplete bladder emptying No date: Macular degeneration   Reproductive/Obstetrics                             Anesthesia Physical Anesthesia Plan  ASA: II  Anesthesia Plan: General   Post-op Pain Management:    Induction: Intravenous  PONV Risk Score and Plan: 1 and Propofol infusion  Airway Management Planned: Natural Airway  Additional Equipment:   Intra-op Plan:   Post-operative Plan:   Informed Consent: I have reviewed the patients History and Physical,  chart, labs and discussed the procedure including the risks, benefits and alternatives for the proposed anesthesia with the patient or authorized representative who has indicated his/her understanding and acceptance.     Dental advisory given  Plan Discussed with: CRNA and Anesthesiologist  Anesthesia Plan Comments:         Anesthesia Quick Evaluation

## 2019-03-31 NOTE — Op Note (Signed)
[facility] Gastroenterology Patient Name: Ruben Green Procedure Date: 03/31/2019 11:50 AM MRN: [id number] Account #: [id number] Date of Birth: 09-10-1933 Admit Type: Outpatient Age: 85 Room: [facility] ENDO ROOM 3 Gender: Male Note Status: Finalized Procedure:            Colonoscopy Indications:          Rectal bleeding Providers:            Lollie Sails, MD Referring MD:         Tracie Harrier, MD (Referring MD) Medicines:            Monitored Anesthesia Care Complications:        No immediate complications. Procedure:            Pre-Anesthesia Assessment:                       - ASA Grade Assessment: III - A patient with severe                        systemic disease.                       After obtaining informed consent, the colonoscope was                        passed under direct vision. Throughout the procedure,                        the patient's blood pressure, pulse, and oxygen                        saturations were monitored continuously. The was                        introduced through the anus and advanced to the the                        cecum, identified by appendiceal orifice and ileocecal                        valve. The colonoscopy was performed without                        difficulty. The patient tolerated the procedure well.                        The quality of the bowel preparation was fair. Findings:      A 5 mm polyp was found in the distal descending colon. The polyp was       sessile. The polyp was removed with a cold snare. Resection and       retrieval were complete.      A 2 mm polyp was found in the proximal ascending colon. The polyp was       sessile. The polyp was removed with a cold biopsy forceps. Resection and       retrieval were complete.      Multiple small and large-mouthed diverticula were found in the sigmoid       colon, descending colon, transverse colon and ascending colon.      Multiple  medium-sized diffuse angioectasias with bleeding on contact  were found in the mid rectum and in the distal rectum. Several of these       had bleeding with simple contact and rinse witth water. The more friable       areas were located and cauterized. Fulguration to stop and prevent the       bleeding by argon plasma at 0.8 liters/minute and 20 watts was       successful. No active bleeding at end of procedure. Impression:           - Preparation of the colon was fair.                       - One 5 mm polyp in the distal descending colon,                        removed with a cold snare. Resected and retrieved.                       - One 2 mm polyp in the proximal ascending colon,                        removed with a cold biopsy forceps. Resected and                        retrieved.                       - Diverticulosis in the sigmoid colon, in the                        descending colon, in the transverse colon and in the                        ascending colon.                       - Multiple colonic angioectasias/radiation proctitis.                        Treated with argon plasma coagulation (APC). Recommendation:       - Clear liquid diet for 2 days.                       - Full liquid diet for 2 days, then advance as                        tolerated to low residue diet for 4 days.                       - Await pathology results.                       - repeat evaluation by flexible sigmoidoscopy in 4-6                        weeks. Procedure Code(s):    --- Professional ---                       [phone], 38, Colonoscopy, flexible; with control of  bleeding, any method                       45385, Colonoscopy, flexible; with removal of tumor(s),                        polyp(s), or other lesion(s) by snare technique                       45380, 92, Colonoscopy, flexible; with biopsy, single                        or multiple Diagnosis Code(s):    ---  Professional ---                       K63.5, Polyp of colon                       K55.20, Angiodysplasia of colon without hemorrhage                       K62.5, Hemorrhage of anus and rectum                       K57.30, Diverticulosis of large intestine without                        perforation or abscess without bleeding CPT copyright 2019 American Medical Association. All rights reserved. The codes documented in this report are preliminary and upon coder review may  be revised to meet current compliance requirements. Lollie Sails, MD 03/31/2019 12:49:17 PM This report has been signed electronically. Number of Addenda: 0 Note Initiated On: 03/31/2019 11:50 AM Scope Withdrawal Time: 0 hours 22 minutes 58 seconds  Total Procedure Duration: 0 hours 32 minutes 40 seconds       [facility]

## 2019-03-31 NOTE — H&P (Signed)
Outpatient short stay form Pre-procedure 03/31/2019 11:08 AM Lollie Sails MD  Primary Physician: Dr Tracie Harrier  Reason for visit: Colonoscopy  History of present illness: Patient is a 85 year old male presenting today for colonoscopy in regards to complaint of some rectal bleeding.  He has a history of radiation treatment for prostate cancer about a year ago.  He last had a colonoscopy on 02/06/2006 showed hyperplastic polyps and diverticulosis.  He has been having some intermittent bright red rectal bleeding with a bowel movement over the past 2 months.    Current Facility-Administered Medications:  .  0.9 %  sodium chloride infusion, , Intravenous, Continuous, Lollie Sails, MD  Medications Prior to Admission  Medication Sig Dispense Refill Last Dose  . aspirin EC 81 MG tablet Take 81 mg by mouth daily.     Marland Kitchen gabapentin (NEURONTIN) 100 MG capsule Take 100 mg by mouth at bedtime.     Marland Kitchen albuterol (PROVENTIL HFA;VENTOLIN HFA) 108 (90 Base) MCG/ACT inhaler Inhale 2 puffs into the lungs every 6 (six) hours as needed for wheezing or shortness of breath. 1 Inhaler 2   . albuterol-ipratropium (COMBIVENT) 18-103 MCG/ACT inhaler Inhale 1-2 puffs into the lungs every 4 (four) hours.     Marland Kitchen amLODipine (NORVASC) 10 MG tablet Take 10 mg by mouth daily.      Marland Kitchen atorvastatin (LIPITOR) 40 MG tablet Take 40 mg by mouth daily.     . finasteride (PROSCAR) 5 MG tablet Take 1 tablet (5 mg total) by mouth daily. 90 tablet 3   . furosemide (LASIX) 20 MG tablet Take 20 mg by mouth daily.      . hydrALAZINE (APRESOLINE) 25 MG tablet Take 25 mg by mouth 2 (two) times daily.     Marland Kitchen LORazepam (ATIVAN) 0.5 MG tablet Take 0.5 mg by mouth every 8 (eight) hours.     Marland Kitchen losartan (COZAAR) 100 MG tablet Take 50 mg by mouth daily.      . tamsulosin (FLOMAX) 0.4 MG CAPS capsule Take 1 capsule (0.4 mg total) by mouth daily. 30 capsule 11      No Known Allergies   Past Medical History:  Diagnosis Date  .  Anxiety   . BPH with obstruction/lower urinary tract symptoms   . Chronic bronchitis ([place])   . COPD (chronic obstructive pulmonary disease) ([place])   . Elevated PSA   . Environmental and seasonal allergies   . HLD (hyperlipidemia)   . HOH (hard of hearing)   . HTN (hypertension)   . Incomplete bladder emptying   . Macular degeneration     Review of systems:      Physical Exam    Heart and lungs: Regular rate and rhythm without rub or gallop lungs are bilaterally clear    HEENT: Normocephalic atraumatic eyes are anicteric    Other:    Pertinant exam for procedure: Soft nontender nondistended bowel sounds positive normoactive    Planned proceedures: Colonoscopy and indicated procedures.    Lollie Sails, MD Gastroenterology 03/31/2019  11:08 AM

## 2019-03-31 NOTE — Anesthesia Post-op Follow-up Note (Signed)
Anesthesia QCDR form completed.        

## 2019-04-01 ENCOUNTER — Encounter: Payer: Self-pay | Admitting: Gastroenterology

## 2019-04-01 LAB — SURGICAL PATHOLOGY

## 2019-04-05 IMAGING — MR MR PROSTATE WO/W CM
24 of 56 series · 24 of 56 positions shown · IV contrast (multihance)
Comparison: None.

CLINICAL DATA: Elevated PSA.  No prior biopsy.

EXAM:
MR PROSTATE WITHOUT AND WITH CONTRAST
TECHNIQUE: Multiplanar multisequence MRI images were obtained of the pelvis
centered about the prostate. Pre and post contrast images were
obtained.
CONTRAST:  16 cc MultiHance

[Series 3: bSSFP fat-sat · axial · 6.0mm · 0.86mm/px · 1 of 45 slices shown]
[im 1/45]
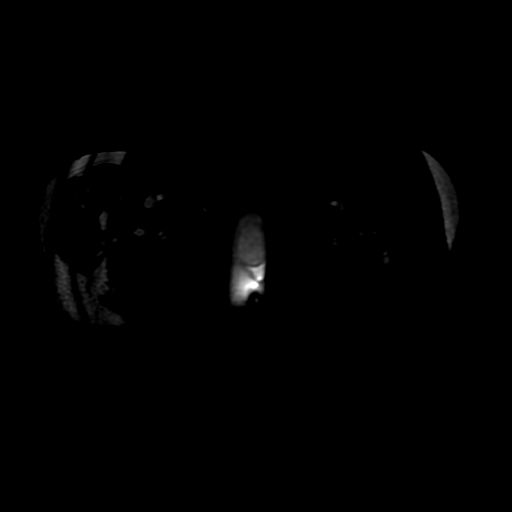

[Series 4: T1 · axial · 6.0mm · 0.86mm/px · 1 of 44 slices shown (1 of 2)]
[im 1/44]
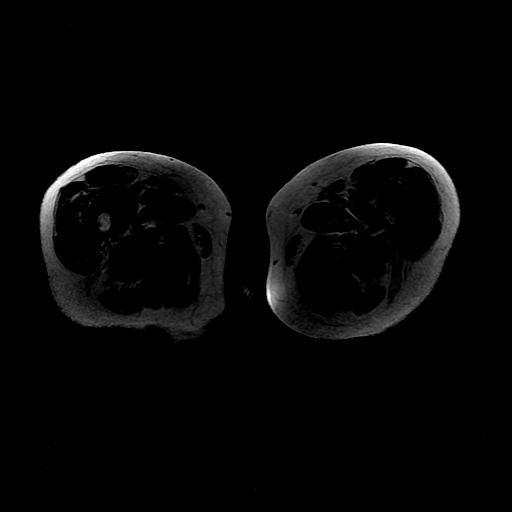

[Series 5: T2 · axial · 3.0mm · 0.29mm/px · 1 of 24 slices shown (1 of 5)]
[im 1/24]
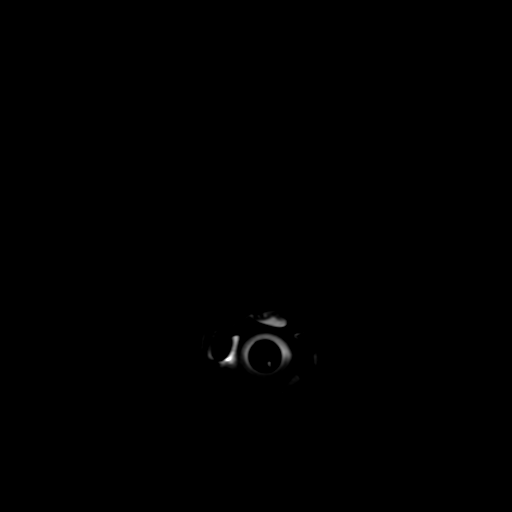

[Series 6: T2 · coronal · 4.0mm · 0.29mm/px · 1 of 24 slices shown (2 of 5)]
[im 1/24]
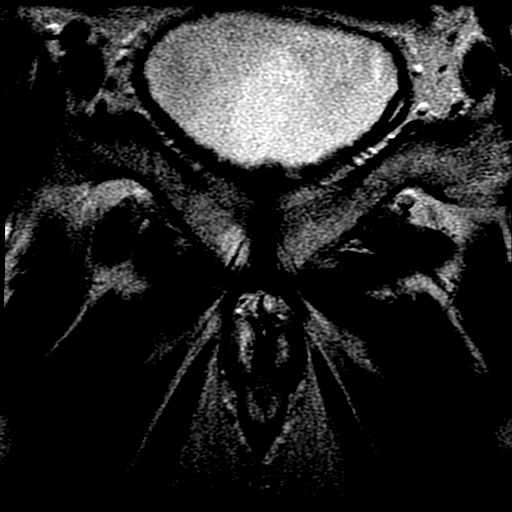

[Series 7: T1 · axial · 3.0mm · 0.29mm/px · 1 of 24 slices shown (2 of 2)]
[im 1/24]
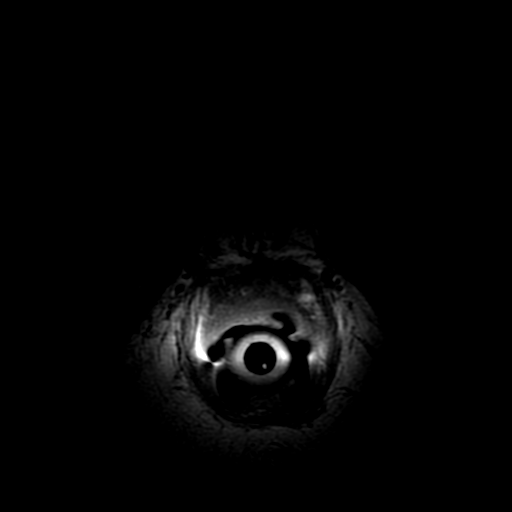

[Series 8: T2 · axial · 1.8mm · 0.47mm/px · 1 of 156 slices shown (3 of 5)]
[im 1/156]
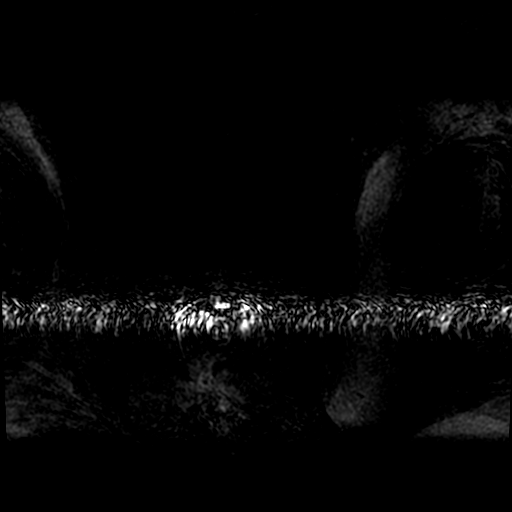

[Series 9: T2 · sagittal · 4.0mm · 0.29mm/px · 1 of 24 slices shown (4 of 5)]
[im 1/24]
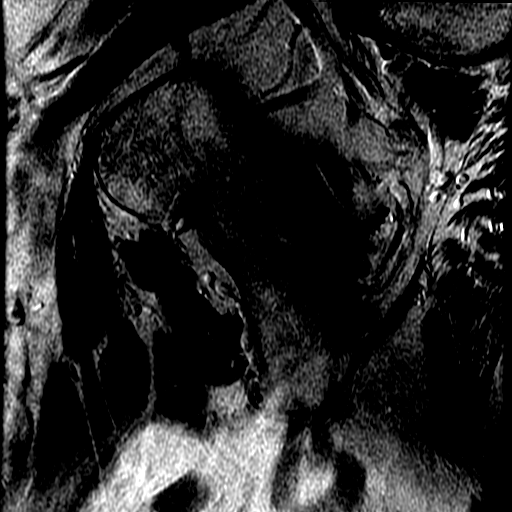

[Series 10: DWI · axial · 3.0mm · 0.59mm/px · 1 of 48 slices shown (1 of 8)]
[im 1/48]
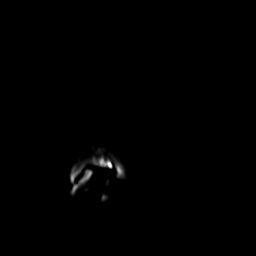

[Series 11: T2 · axial · 1.8mm · 0.47mm/px · 1 of 132 slices shown (5 of 5)]
[im 1/132]
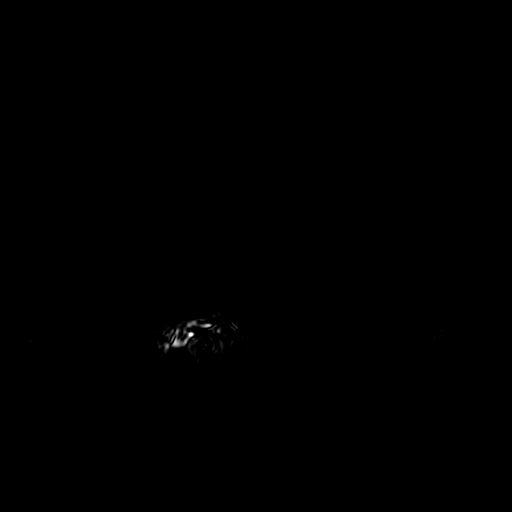

[Series 12: DWI · axial · 3.0mm · 0.59mm/px · 1 of 48 slices shown (2 of 8)]
[im 1/48]
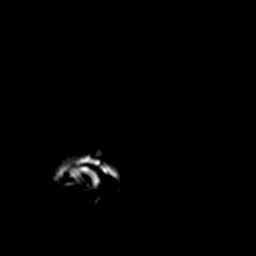

[Series 13: DWI · axial · 3.0mm · 0.59mm/px · 1 of 48 slices shown (3 of 8)]
[im 1/48]
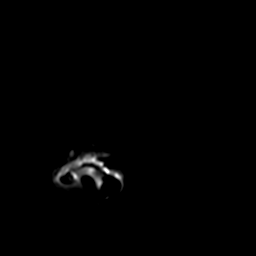

[Series 14: DWI · axial · 3.0mm · 0.59mm/px · 1 of 48 slices shown (4 of 8)]
[im 1/48]
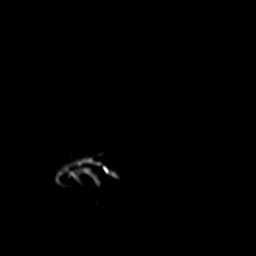

[Series 800: reformatted · axial · 1.8mm · 0.47mm/px · 1 of 70 slices shown (1 of 4)]
[im 1/70]
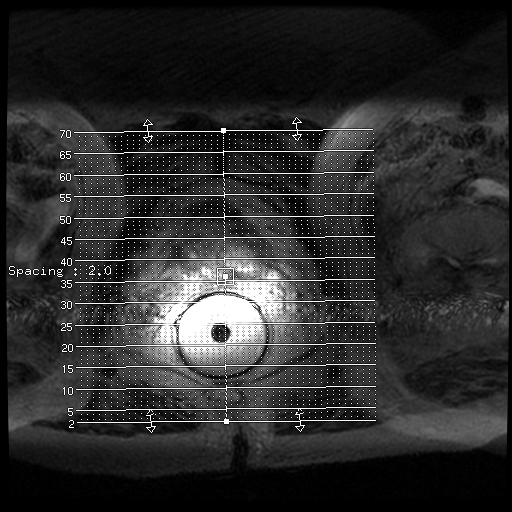

[Series 801: reformatted · sagittal · 2.0mm · 0.43mm/px · 1 of 63 slices shown (2 of 4)]
[im 1/63]
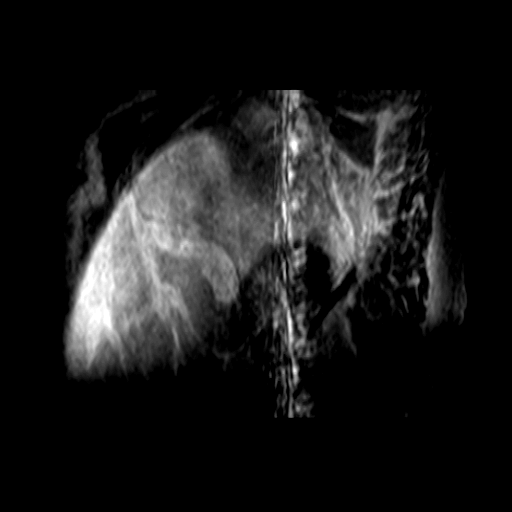

[Series 1000: DWI · axial · 3.0mm · 0.59mm/px · 1 of 24 slices shown (5 of 8)]
[im 1/24]
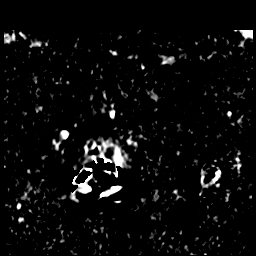

[Series 1100: reformatted · coronal · 2.0mm · 0.39mm/px · 1 of 69 slices shown (3 of 4)]
[im 1/69]
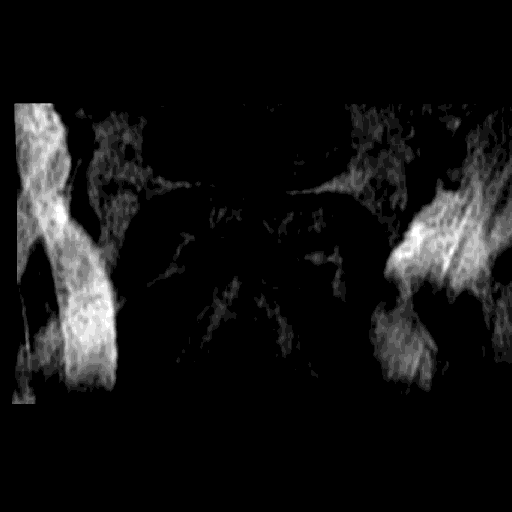

[Series 1101: reformatted · axial · 1.8mm · 0.47mm/px · 1 of 64 slices shown (4 of 4)]
[im 1/64]
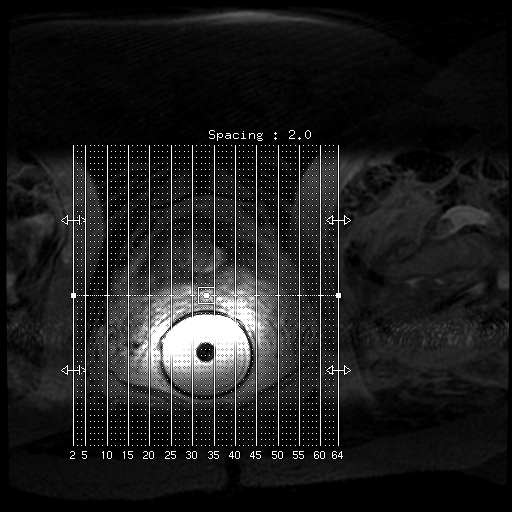

[Series 1200: DWI · axial · 3.0mm · 0.59mm/px · 1 of 24 slices shown (6 of 8)]
[im 1/24]
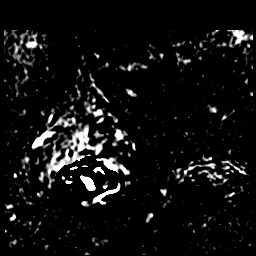

[Series 1300: DWI · axial · 3.0mm · 0.59mm/px · 1 of 24 slices shown (7 of 8)]
[im 1/24]
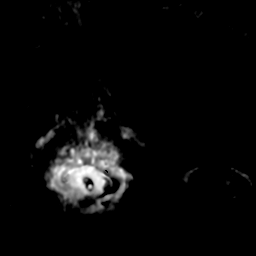

[Series 1400: DWI · axial · 3.0mm · 0.59mm/px · 1 of 24 slices shown (8 of 8)]
[im 1/24]
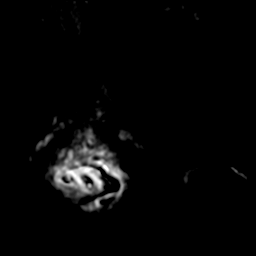

[((id)/(id)/1)-((id)/(id)/1) · axial · 3.0mm · 0.43mm/px · 1 of 76 slices shown (1 of 4)]
[im 1/76]
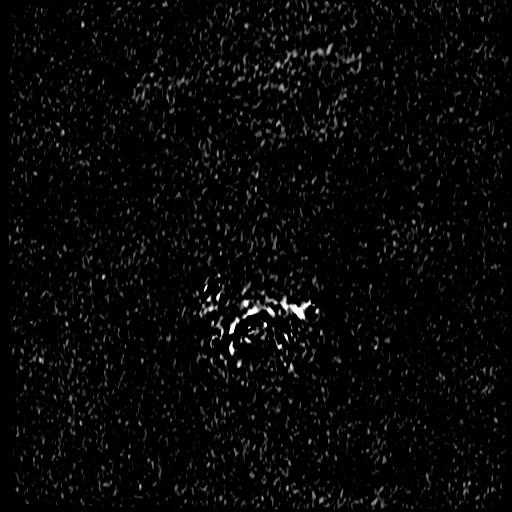

[((id)/(id)/1)-((id)/(id)/1) · axial · 3.0mm · 0.43mm/px · 1 of 76 slices shown (2 of 4)]
[im 1/76]
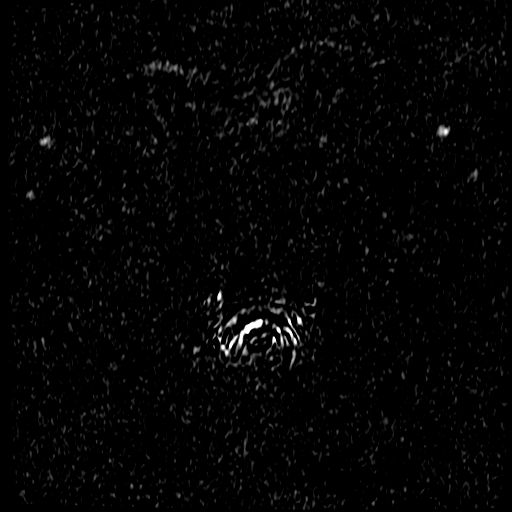

[((id)/(id)/1)-((id)/(id)/1) · axial · 3.0mm · 0.43mm/px · 1 of 76 slices shown (3 of 4)]
[im 1/76]
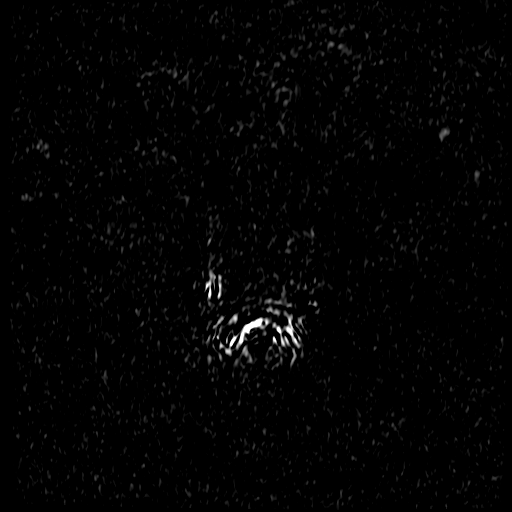

[((id)/(id)/1)-((id)/(id)/1) · axial · 3.0mm · 0.43mm/px · 1 of 76 slices shown (4 of 4)]
[im 1/76]
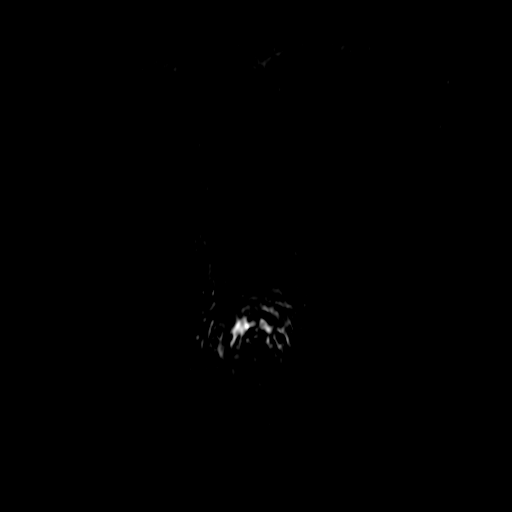

[24 of 56 positions shown; findings below may reference images not displayed]

FINDINGS: Prostate: Demonstrates moderate central gland enlargement and
heterogeneity, consistent with benign prostatic hyperplasia.

Right lateral mid peripheral zone area of relatively
well-circumscribed T2 hypointensity, including at maximally 1.9 cm
on image 18/series 5, image 11/series 6, and 1.2 cm on image 5/
series 6. This corresponds to restricted diffusion on image 6/
series 0000 and image 6/series 6599. Early post-contrast
enhancement, including at up to 1.9 cm on image 49/ series 88816.

Motion degradation, most significant involving the pre and
postcontrast dynamic series.

Volume: 3.2 x 5.4 x 4.1 cm (volume = 37 cm^3)

Transcapsular spread:  Absent

Seminal vesicle involvement: Absent. There is T1 hyperintensity
within the right seminal vesicle which likely represents hemorrhage
on image 11/series 7.

Neurovascular bundle involvement: Absent

Pelvic adenopathy: Absent

Bone metastasis: Absent

Other findings: Fat containing left inguinal hernia. Extensive
colonic diverticulosis. Bladder wall irregularity with a left-sided
bladder diverticulum.

No significant free fluid. Small volume fluid in the right hip joint
including on image 70/series 3. Subcapsular signal irregularity
within the head on image 64/series 3.
IMPRESSION: 1. Multiparametric signal abnormality within the lateral right
peripheral zone, highly suspicious for macroscopic, higher grade
carcinoma. [REDACTED]
2.  No evidence of locally advanced or pelvic metastatic disease.
3. Bladder outlet obstruction, as evidenced by a bladder
diverticulum and mild wall irregularity.
4. Fluid in the right hip joint may be degenerative. Cannot exclude
avascular necrosis. Correlate with symptoms and possibly dedicated
CT.

## 2019-04-21 DIAGNOSIS — K625 Hemorrhage of anus and rectum: Secondary | ICD-10-CM | POA: Diagnosis not present

## 2019-04-21 DIAGNOSIS — D649 Anemia, unspecified: Secondary | ICD-10-CM | POA: Diagnosis not present

## 2019-04-21 DIAGNOSIS — Z125 Encounter for screening for malignant neoplasm of prostate: Secondary | ICD-10-CM | POA: Diagnosis not present

## 2019-04-21 DIAGNOSIS — I10 Essential (primary) hypertension: Secondary | ICD-10-CM | POA: Diagnosis not present

## 2019-04-21 DIAGNOSIS — F411 Generalized anxiety disorder: Secondary | ICD-10-CM | POA: Diagnosis not present

## 2019-04-21 DIAGNOSIS — J42 Unspecified chronic bronchitis: Secondary | ICD-10-CM | POA: Diagnosis not present

## 2019-04-28 DIAGNOSIS — F419 Anxiety disorder, unspecified: Secondary | ICD-10-CM | POA: Diagnosis not present

## 2019-04-28 DIAGNOSIS — J42 Unspecified chronic bronchitis: Secondary | ICD-10-CM | POA: Diagnosis not present

## 2019-04-28 DIAGNOSIS — R790 Abnormal level of blood mineral: Secondary | ICD-10-CM | POA: Diagnosis not present

## 2019-04-28 DIAGNOSIS — I10 Essential (primary) hypertension: Secondary | ICD-10-CM | POA: Diagnosis not present

## 2019-04-28 DIAGNOSIS — D649 Anemia, unspecified: Secondary | ICD-10-CM | POA: Diagnosis not present

## 2019-04-28 DIAGNOSIS — E538 Deficiency of other specified B group vitamins: Secondary | ICD-10-CM | POA: Diagnosis not present

## 2019-04-28 DIAGNOSIS — Z23 Encounter for immunization: Secondary | ICD-10-CM | POA: Diagnosis not present

## 2019-05-04 DIAGNOSIS — D649 Anemia, unspecified: Secondary | ICD-10-CM | POA: Diagnosis not present

## 2019-06-02 DIAGNOSIS — K627 Radiation proctitis: Secondary | ICD-10-CM | POA: Diagnosis not present

## 2019-06-02 DIAGNOSIS — K625 Hemorrhage of anus and rectum: Secondary | ICD-10-CM | POA: Diagnosis not present

## 2019-06-04 DIAGNOSIS — I714 Abdominal aortic aneurysm, without rupture: Secondary | ICD-10-CM | POA: Diagnosis not present

## 2019-06-04 DIAGNOSIS — I10 Essential (primary) hypertension: Secondary | ICD-10-CM | POA: Diagnosis not present

## 2019-06-04 DIAGNOSIS — E782 Mixed hyperlipidemia: Secondary | ICD-10-CM | POA: Diagnosis not present

## 2019-06-04 DIAGNOSIS — I2510 Atherosclerotic heart disease of native coronary artery without angina pectoris: Secondary | ICD-10-CM | POA: Diagnosis not present

## 2019-06-30 DIAGNOSIS — I2510 Atherosclerotic heart disease of native coronary artery without angina pectoris: Secondary | ICD-10-CM | POA: Diagnosis not present

## 2019-07-07 ENCOUNTER — Encounter: Payer: Self-pay | Admitting: *Deleted

## 2019-07-29 DIAGNOSIS — I5189 Other ill-defined heart diseases: Secondary | ICD-10-CM

## 2019-07-29 DIAGNOSIS — I2510 Atherosclerotic heart disease of native coronary artery without angina pectoris: Secondary | ICD-10-CM | POA: Diagnosis not present

## 2019-07-29 DIAGNOSIS — I714 Abdominal aortic aneurysm, without rupture: Secondary | ICD-10-CM | POA: Diagnosis not present

## 2019-07-29 HISTORY — DX: Other ill-defined heart diseases: I51.89

## 2019-07-30 DIAGNOSIS — I2510 Atherosclerotic heart disease of native coronary artery without angina pectoris: Secondary | ICD-10-CM | POA: Diagnosis not present

## 2019-07-30 DIAGNOSIS — I6523 Occlusion and stenosis of bilateral carotid arteries: Secondary | ICD-10-CM | POA: Diagnosis not present

## 2019-07-30 DIAGNOSIS — I10 Essential (primary) hypertension: Secondary | ICD-10-CM | POA: Diagnosis not present

## 2019-07-30 DIAGNOSIS — I6522 Occlusion and stenosis of left carotid artery: Secondary | ICD-10-CM | POA: Diagnosis not present

## 2019-07-30 DIAGNOSIS — I714 Abdominal aortic aneurysm, without rupture: Secondary | ICD-10-CM | POA: Diagnosis not present

## 2019-07-30 DIAGNOSIS — E782 Mixed hyperlipidemia: Secondary | ICD-10-CM | POA: Diagnosis not present

## 2019-08-05 ENCOUNTER — Other Ambulatory Visit: Payer: Self-pay

## 2019-08-05 DIAGNOSIS — C61 Malignant neoplasm of prostate: Secondary | ICD-10-CM

## 2019-08-06 ENCOUNTER — Other Ambulatory Visit: Payer: PPO

## 2019-08-11 ENCOUNTER — Other Ambulatory Visit: Payer: PPO

## 2019-08-11 ENCOUNTER — Ambulatory Visit: Payer: PPO | Admitting: Urology

## 2019-08-11 ENCOUNTER — Other Ambulatory Visit: Payer: Self-pay

## 2019-08-11 DIAGNOSIS — C61 Malignant neoplasm of prostate: Secondary | ICD-10-CM | POA: Diagnosis not present

## 2019-08-12 ENCOUNTER — Ambulatory Visit: Payer: PPO | Admitting: Urology

## 2019-08-12 ENCOUNTER — Encounter: Payer: Self-pay | Admitting: Urology

## 2019-08-12 VITALS — BP 145/78 | HR 81 | Wt 180.0 lb

## 2019-08-12 DIAGNOSIS — C61 Malignant neoplasm of prostate: Secondary | ICD-10-CM | POA: Diagnosis not present

## 2019-08-12 LAB — PSA: Prostate Specific Ag, Serum: <0.1 ng/mL (ref 0.0–4.0)

## 2019-08-12 MED ORDER — LEUPROLIDE ACETATE (6 MONTH) 45 MG ~~LOC~~ KIT
45.0000 mg | PACK | Freq: Once | SUBCUTANEOUS | Status: AC
  Administered 2019-08-12: 45 mg via SUBCUTANEOUS

## 2019-08-12 NOTE — Progress Notes (Signed)
07/01/2018  4:13 PM   Ruben Green [birth year] [id number]  Referring provider: Tracie Harrier, [place] [facility] [place],  [place] [id number]  Chief Complaint  Patient presents with  . Prostate Cancer    HPI: Ruben Green [place] is a 86 y.o. male with prostate cancer s/p radiation therapy who presents today for Lupron injection and office visit.  Completed radiation therapy to his prostate and pelvic nodes in 09/2017 for Gleason's 8 (4+4) adenocarcinoma.  iPSA 12.7.  His last ADT (Eligard 45 mg) given today (08/12/2019).    His recent PSA was <0.1 on 08/11/2019.      He still has not been taking his calcium or Vitamin D.  His wife is with him today and instructed him to start the vitamins.     He reports good energy, no weight loss, good appetite, no bone pain and no hot flashes.  Patient denies any modifying or aggravating factors.  Patient denies any gross hematuria, dysuria or suprapubic/flank pain.  Patient denies any fevers, chills, nausea or vomiting.   He was seen on 07/30/2019 by Dr. Serafina Royals for a routine cardiology follow up.  He was encouraged to improve on a healthy lifestyle and to continue on present medications.    Colonoscopy was performed in 03/2019 for rectal bleeding and had 2 polyps removed and radiation proctitis.    PMH: Past Medical History:  Diagnosis Date  . Anxiety   . BPH with obstruction/lower urinary tract symptoms   . Chronic bronchitis ([place])   . COPD (chronic obstructive pulmonary disease) ([place])   . Elevated PSA   . Environmental and seasonal allergies   . HLD (hyperlipidemia)   . HOH (hard of hearing)   . HTN (hypertension)   . Incomplete bladder emptying   . Macular degeneration     Surgical History: Past Surgical History:  Procedure Laterality Date  . ABDOMINAL AORTIC ANEURYSM REPAIR    . CATARACT EXTRACTION W/PHACO Right 09/25/2016   Procedure: CATARACT EXTRACTION PHACO AND INTRAOCULAR LENS  PLACEMENT (IOC);  Surgeon: Birder Robson, MD;  Location: [facility] ORS;  Service: Ophthalmology;  Laterality: Right;  [place] 01:01 AP% 17.4 CDE 10.75 Fluid pack lot # [id number]  . CATARACT EXTRACTION W/PHACO Left 10/16/2016   Procedure: CATARACT EXTRACTION PHACO AND INTRAOCULAR LENS PLACEMENT ([place]) Suture placed in Left eye;  Surgeon: Birder Robson, MD;  Location: [facility] ORS;  Service: Ophthalmology;  Laterality: Left;  [place] 2:06.8 AP% 22.2 CDE 28.17 Fluid pack lot # [id number]  . COLONOSCOPY WITH PROPOFOL N/A 03/31/2019   Procedure: COLONOSCOPY WITH PROPOFOL;  Surgeon: Lollie Sails, MD;  Location: [facility] ENDOSCOPY;  Service: Endoscopy;  Laterality: N/A;  . HERNIA REPAIR     x 2  . INGUINAL HERNIA REPAIR Right 12/02/2015   Procedure: HERNIA REPAIR INGUINAL ADULT;  Surgeon: Leonie Green, MD;  Location: [facility] ORS;  Service: General;  Laterality: Right;  . PROSTATE SURGERY  2012  . Right Carotid Endarterectomy      Home Medications:  Allergies as of 08/12/2019   No Known Allergies     Medication List       Accurate as of August 12, 2019  4:13 PM. If you have any questions, ask your nurse or doctor.        albuterol 108 (90 Base) MCG/ACT inhaler Commonly known as: VENTOLIN HFA Inhale 2 puffs into the lungs every 6 (six) hours as needed for wheezing or shortness of breath.   albuterol-ipratropium 18-103 MCG/ACT  inhaler Commonly known as: COMBIVENT Inhale 1-2 puffs into the lungs every 4 (four) hours.   amLODipine 10 MG tablet Commonly known as: NORVASC Take 10 mg by mouth daily.   aspirin EC 81 MG tablet Take 81 mg by mouth daily.   atorvastatin 40 MG tablet Commonly known as: LIPITOR Take 40 mg by mouth daily.   finasteride 5 MG tablet Commonly known as: Proscar Take 1 tablet (5 mg total) by mouth daily.   furosemide 20 MG tablet Commonly known as: LASIX Take 20 mg by mouth daily.   gabapentin 100 MG capsule Commonly known as: NEURONTIN Take 100 mg by mouth at  bedtime.   hydrALAZINE 25 MG tablet Commonly known as: APRESOLINE Take 25 mg by mouth 2 (two) times daily.   LORazepam 0.5 MG tablet Commonly known as: ATIVAN Take 0.5 mg by mouth every 8 (eight) hours.   losartan 100 MG tablet Commonly known as: COZAAR Take 50 mg by mouth daily.   tamsulosin 0.4 MG Caps capsule Commonly known as: FLOMAX Take 1 capsule (0.4 mg total) by mouth daily.       Allergies: No Known Allergies  Family History: Family History  Problem Relation Age of Onset  . Cancer Brother        2000  . Prostate cancer Neg Hx   . Bladder Cancer Neg Hx   . Kidney cancer Neg Hx     Social History:  reports that he quit smoking about 20 years ago. He has quit using smokeless tobacco. He reports current alcohol use. He reports that he does not use drugs.  ROS: Pertinent review of systems located in history of present illness  Physical Exam: BP (!) 145/78   Pulse 81   Wt 180 lb (81.6 kg)   BMI 28.19 kg/m   Constitutional:  Well nourished. Alert and oriented, No acute distress. HEENT: [place] AT, mask in place.  Trachea midline, no masses. Cardiovascular: No clubbing, cyanosis, or edema. Respiratory: Normal respiratory effort, no increased work of breathing. Neurologic: Grossly intact, no focal deficits, moving all 4 extremities. Psychiatric: Normal mood and affect.  Laboratory Data: Lab Results  Component Value Date   WBC 3.9 09/10/2017   HGB 10.5 (L) 09/10/2017   HCT 32.1 (L) 09/10/2017   MCV 93.8 09/10/2017   PLT 134 (L) 09/10/2017    Lab Results  Component Value Date   CREATININE 1.10 04/09/2017    Urinalysis    Component Value Date/Time   APPEARANCEUR Clear 07/15/2017 1457   GLUCOSEU Negative 07/15/2017 1457   BILIRUBINUR Negative 07/15/2017 1457   PROTEINUR Trace 07/15/2017 1457   NITRITE Negative 07/15/2017 1457   LEUKOCYTESUR Negative 07/15/2017 1457   I have reviewed the labs.  Assessment & Plan:    1. Prostate cancer - Gleason 8  (4+4) adenocarcinoma iPSA 12.7 - treated with radiation and ADT - Current PSA <0.1  - 6 months Eligard given today - F/U in 6 months for 6 month ADT and PSA - Ensure his wife is present to interpret for him - Encouraged patient to start taking calcium and Vitamin D  Return in about 6 months (around 02/09/2020) for PSA and ADT .  These notes generated with voice recognition software. I apologize for typographical errors.  Zara Council, PA-C  [facility] [address]  [place], [place] [address] [phone]

## 2019-08-12 NOTE — Patient Instructions (Signed)
Calcium 600 mg twice daily and Vitamin D 400 IU

## 2019-08-13 ENCOUNTER — Other Ambulatory Visit: Payer: Self-pay

## 2019-08-13 ENCOUNTER — Inpatient Hospital Stay: Payer: PPO | Attending: Radiation Oncology

## 2019-08-13 DIAGNOSIS — C61 Malignant neoplasm of prostate: Secondary | ICD-10-CM | POA: Insufficient documentation

## 2019-08-13 LAB — PSA: Prostatic Specific Antigen: <0.01 ng/mL (ref 0.00–4.00)

## 2019-08-18 ENCOUNTER — Other Ambulatory Visit: Payer: Self-pay | Admitting: *Deleted

## 2019-08-18 ENCOUNTER — Other Ambulatory Visit: Payer: Self-pay

## 2019-08-18 ENCOUNTER — Encounter: Payer: Self-pay | Admitting: Radiation Oncology

## 2019-08-18 ENCOUNTER — Ambulatory Visit
Admission: RE | Admit: 2019-08-18 | Discharge: 2019-08-18 | Disposition: A | Discharge status: Home / Self Care | Payer: PPO | Source: Ambulatory Visit | Attending: Radiation Oncology | Admitting: Radiation Oncology

## 2019-08-18 VITALS — BP 152/75 | HR 87 | Temp 97.2°F | Resp 18 | Wt 183.2 lb

## 2019-08-18 DIAGNOSIS — Z923 Personal history of irradiation: Secondary | ICD-10-CM | POA: Diagnosis not present

## 2019-08-18 DIAGNOSIS — C61 Malignant neoplasm of prostate: Secondary | ICD-10-CM | POA: Diagnosis not present

## 2019-08-18 NOTE — Progress Notes (Signed)
Radiation Oncology Follow up Note  Name: Ruben Green   Date:   08/18/2019 MRN:  [id number] DOB: 1933-07-11    This 86 y.o. male presents to the clinic today for 2-year follow-up status post IMRT radiation therapy to his prostate and pelvic nodes for stage IIb adenocarcinoma Gleason 8 (4+4) presenting with a PSA of 12.7.  REFERRING PROVIDER: Tracie Harrier, MD  HPI: Patient is a 86 year old male now out 2 years having completed IMRT radiation therapy to prostate and pelvic nodes for stage IIb adenocarcinoma.Marland Kitchen  He is seen today in routine follow-up is doing well he has developed some rectal bleeding was noted to have some hypervascular vessels in his rectum and he does see occasional bright red blood per stool.  He is currently on Eligard under urology's care.  His PSA most recently is less than 0.01.  He is currently on iron supplements for his hemoglobin.  COMPLICATIONS OF TREATMENT: none  FOLLOW UP COMPLIANCE: keeps appointments   PHYSICAL EXAM:  BP (!) 152/75 (BP Location: Left Arm, Patient Position: Sitting)   Pulse 87   Temp (!) 97.2 F (36.2 C) (Tympanic)   Resp 18   Wt 183 lb 3.2 oz (83.1 kg)   BMI 28.69 kg/m  Well-developed well-nourished patient in NAD. HEENT reveals PERLA, EOMI, discs not visualized.  Oral cavity is clear. No oral mucosal lesions are identified. Neck is clear without evidence of cervical or supraclavicular adenopathy. Lungs are clear to A&P. Cardiac examination is essentially unremarkable with regular rate and rhythm without murmur rub or thrill. Abdomen is benign with no organomegaly or masses noted. Motor sensory and DTR levels are equal and symmetric in the upper and lower extremities. Cranial nerves II through XII are grossly intact. Proprioception is intact. No peripheral adenopathy or edema is identified. No motor or sensory levels are noted. Crude visual fields are within normal range.  RADIOLOGY RESULTS: No current films to review  PLAN:  Present time patient is doing well under excellent biochemical control of his prostate cancer.  He continues on iron supplements for his bleeding although it appears to be negligible in his counts are only slightly anemic.  I have asked to see him back in 1 year for follow-up.  He continues close follow-up care with urology.  Patient and family know to call with any concerns.  I would like to take this opportunity to thank you for allowing me to participate in the care of your patient.Noreene Filbert, MD

## 2019-08-20 ENCOUNTER — Ambulatory Visit: Payer: PPO | Admitting: Radiation Oncology

## 2019-08-25 DIAGNOSIS — F419 Anxiety disorder, unspecified: Secondary | ICD-10-CM | POA: Diagnosis not present

## 2019-08-25 DIAGNOSIS — D649 Anemia, unspecified: Secondary | ICD-10-CM | POA: Diagnosis not present

## 2019-08-25 DIAGNOSIS — R790 Abnormal level of blood mineral: Secondary | ICD-10-CM | POA: Diagnosis not present

## 2019-08-25 DIAGNOSIS — E538 Deficiency of other specified B group vitamins: Secondary | ICD-10-CM | POA: Diagnosis not present

## 2019-08-25 DIAGNOSIS — J42 Unspecified chronic bronchitis: Secondary | ICD-10-CM | POA: Diagnosis not present

## 2019-08-25 DIAGNOSIS — I10 Essential (primary) hypertension: Secondary | ICD-10-CM | POA: Diagnosis not present

## 2019-09-01 DIAGNOSIS — D649 Anemia, unspecified: Secondary | ICD-10-CM | POA: Diagnosis not present

## 2019-09-01 DIAGNOSIS — Z0000 Encounter for general adult medical examination without abnormal findings: Secondary | ICD-10-CM | POA: Diagnosis not present

## 2019-09-01 DIAGNOSIS — I10 Essential (primary) hypertension: Secondary | ICD-10-CM | POA: Diagnosis not present

## 2019-09-01 DIAGNOSIS — C61 Malignant neoplasm of prostate: Secondary | ICD-10-CM | POA: Diagnosis not present

## 2019-09-01 DIAGNOSIS — F330 Major depressive disorder, recurrent, mild: Secondary | ICD-10-CM | POA: Diagnosis not present

## 2019-09-01 DIAGNOSIS — R5383 Other fatigue: Secondary | ICD-10-CM | POA: Diagnosis not present

## 2019-09-01 DIAGNOSIS — F419 Anxiety disorder, unspecified: Secondary | ICD-10-CM | POA: Diagnosis not present

## 2019-09-01 DIAGNOSIS — F411 Generalized anxiety disorder: Secondary | ICD-10-CM | POA: Diagnosis not present

## 2019-09-01 DIAGNOSIS — R739 Hyperglycemia, unspecified: Secondary | ICD-10-CM | POA: Diagnosis not present

## 2019-09-01 DIAGNOSIS — R5381 Other malaise: Secondary | ICD-10-CM | POA: Diagnosis not present

## 2019-09-01 DIAGNOSIS — E782 Mixed hyperlipidemia: Secondary | ICD-10-CM | POA: Diagnosis not present

## 2019-10-06 DIAGNOSIS — R1030 Lower abdominal pain, unspecified: Secondary | ICD-10-CM | POA: Diagnosis not present

## 2019-10-06 DIAGNOSIS — M545 Low back pain: Secondary | ICD-10-CM | POA: Diagnosis not present

## 2019-10-06 DIAGNOSIS — G8929 Other chronic pain: Secondary | ICD-10-CM | POA: Diagnosis not present

## 2019-10-26 DIAGNOSIS — H353122 Nonexudative age-related macular degeneration, left eye, intermediate dry stage: Secondary | ICD-10-CM | POA: Diagnosis not present

## 2019-10-26 DIAGNOSIS — Z961 Presence of intraocular lens: Secondary | ICD-10-CM | POA: Diagnosis not present

## 2019-12-03 ENCOUNTER — Ambulatory Visit: Payer: PPO | Admitting: Physician Assistant

## 2019-12-03 ENCOUNTER — Other Ambulatory Visit: Payer: Self-pay

## 2019-12-03 VITALS — BP 176/105 | HR 73 | Ht 64.0 in | Wt 175.0 lb

## 2019-12-03 DIAGNOSIS — R310 Gross hematuria: Secondary | ICD-10-CM | POA: Diagnosis not present

## 2019-12-03 DIAGNOSIS — R319 Hematuria, unspecified: Secondary | ICD-10-CM | POA: Diagnosis not present

## 2019-12-03 NOTE — Progress Notes (Signed)
12/03/2019 5:44 PM   Ruben Green 15-Sep-1933 [id number]  CC: Chief Complaint  Patient presents with  . Hematuria    HPI: Ruben Green is a 86 y.o. male with prostate cancer s/p IMRT on ADT who experienced radiation proctitis in October 2020 who presents today for evaluation of gross hematuria.  Today, patient reports a 3-day history of gross hematuria.  He does report some new bilateral low back soreness associated with this.  He denies dysuria, urgency, and frequency.  He takes 81 mg of aspirin daily, no other anticoagulants.  He does have a remote history of nephrolithiasis approximately 50 years ago.  He has an approximate 15-pack-year smoking history, having quit 10 years ago.  He does not have a personal or family history of bladder or kidney cancers.  In-office UA today positive for 3+ blood and 2+ protein; urine microscopy with >30 RBCs/HPF.  PMH: Past Medical History:  Diagnosis Date  . Anxiety   . BPH with obstruction/lower urinary tract symptoms   . Chronic bronchitis ([place])   . COPD (chronic obstructive pulmonary disease) ([place])   . Elevated PSA   . Environmental and seasonal allergies   . HLD (hyperlipidemia)   . HOH (hard of hearing)   . HTN (hypertension)   . Incomplete bladder emptying   . Macular degeneration     Surgical History: Past Surgical History:  Procedure Laterality Date  . ABDOMINAL AORTIC ANEURYSM REPAIR    . CATARACT EXTRACTION W/PHACO Right 09/25/2016   Procedure: CATARACT EXTRACTION PHACO AND INTRAOCULAR LENS PLACEMENT (IOC);  Surgeon: Birder Robson, MD;  Location: [facility] ORS;  Service: Ophthalmology;  Laterality: Right;  [place] 01:01 AP% 17.4 CDE 10.75 Fluid pack lot # [id number]  . CATARACT EXTRACTION W/PHACO Left 10/16/2016   Procedure: CATARACT EXTRACTION PHACO AND INTRAOCULAR LENS PLACEMENT ([place]) Suture placed in Left eye;  Surgeon: Birder Robson, MD;  Location: [facility] ORS;  Service: Ophthalmology;  Laterality: Left;  [place]  2:06.8 AP% 22.2 CDE 28.17 Fluid pack lot # [id number]  . COLONOSCOPY WITH PROPOFOL N/A 03/31/2019   Procedure: COLONOSCOPY WITH PROPOFOL;  Surgeon: Lollie Sails, MD;  Location[facility] ENDOSCOPY;  Service: Endoscopy;  Laterality: N/A;  . HERNIA REPAIR     x 2  . INGUINAL HERNIA REPAIR Right 12/02/2015   Procedure: HERNIA REPAIR INGUINAL ADULT;  Surgeon: Leonie Green, MD;  Location: [facility] ORS;  Service: General;  Laterality: Right;  . PROSTATE SURGERY  2012  . Right Carotid Endarterectomy      Home Medications:  Allergies as of 12/03/2019   No Known Allergies     Medication List       Accurate as of December 03, 2019  5:44 PM. If you have any questions, ask your nurse or doctor.        albuterol 108 (90 Base) MCG/ACT inhaler Commonly known as: VENTOLIN HFA Inhale 2 puffs into the lungs every 6 (six) hours as needed for wheezing or shortness of breath.   albuterol-ipratropium 18-103 MCG/ACT inhaler Commonly known as: COMBIVENT Inhale 1-2 puffs into the lungs every 4 (four) hours.   amLODipine 10 MG tablet Commonly known as: NORVASC Take 10 mg by mouth daily.   aspirin EC 81 MG tablet Take 81 mg by mouth daily.   atorvastatin 40 MG tablet Commonly known as: LIPITOR Take 40 mg by mouth daily.   finasteride 5 MG tablet Commonly known as: Proscar Take 1 tablet (5 mg total) by mouth daily.   furosemide 20 MG tablet  Commonly known as: LASIX Take 20 mg by mouth daily.   gabapentin 100 MG capsule Commonly known as: NEURONTIN Take 100 mg by mouth at bedtime.   hydrALAZINE 25 MG tablet Commonly known as: APRESOLINE Take 25 mg by mouth 2 (two) times daily.   LORazepam 0.5 MG tablet Commonly known as: ATIVAN Take 0.5 mg by mouth every 8 (eight) hours.   losartan 100 MG tablet Commonly known as: COZAAR Take 50 mg by mouth daily.   tamsulosin 0.4 MG Caps capsule Commonly known as: FLOMAX Take 1 capsule (0.4 mg total) by mouth daily.   Trelegy Ellipta  100-62.5-25 MCG/INH Aepb Generic drug: Fluticasone-Umeclidin-Vilant Inhale into the lungs.   Trelegy Ellipta 100-62.5-25 MCG/INH Aepb Generic drug: Fluticasone-Umeclidin-Vilant Inhale 1 puff into the lungs daily.       Allergies:  No Known Allergies  Family History: Family History  Problem Relation Age of Onset  . Cancer Brother        2000  . Prostate cancer Neg Hx   . Bladder Cancer Neg Hx   . Kidney cancer Neg Hx     Social History:   reports that he quit smoking about 21 years ago. He has quit using smokeless tobacco. He reports current alcohol use. He reports that he does not use drugs.  Physical Exam: BP (!) 176/105   Pulse 73   Ht 5\' 4"  (1.626 m)   Wt 175 lb (79.4 kg)   BMI 30.04 kg/m   Constitutional:  Alert and oriented, no acute distress, nontoxic appearing HEENT: [place], AT Cardiovascular: No clubbing, cyanosis, or edema Respiratory: Normal respiratory effort, no increased work of breathing Skin: No rashes, bruises or suspicious lesions Neurologic: Grossly intact, no focal deficits, moving all 4 extremities Psychiatric: Normal mood and affect  Laboratory Data: Results for orders placed or performed in visit on 12/03/19  Microscopic Examination   Urine  Result Value Ref Range   WBC, UA 0-5 0 - 5 /hpf   RBC >30 (A) 0 - 2 /hpf   Epithelial Cells (non renal) None seen 0 - 10 /hpf   Bacteria, UA None seen None seen/Few  Urinalysis, Complete  Result Value Ref Range   Specific Gravity, UA 1.025 1.005 - 1.030   pH, UA 6.0 5.0 - 7.5   Color, UA Red (A) Yellow   Appearance Ur Cloudy (A) Clear   Leukocytes,UA Negative Negative   Protein,UA 2+ (A) Negative/Trace   Glucose, UA Negative Negative   Ketones, UA Trace (A) Negative   RBC, UA 3+ (A) Negative   Bilirubin, UA Negative Negative   Urobilinogen, Ur 1.0 0.2 - 1.0 mg/dL   Nitrite, UA Negative Negative   Microscopic Examination See below:    Assessment & Plan:   1. Gross hematuria UA reassuring for  infection.  Strongly suspect radiation cystitis given his recent history of radiation proctitis.  Regardless, recommend hematuria work-up at this time.  Counseled patient that this entailed a CT urogram with follow-up cystoscopy for evaluation of the complete urinary tract.  Orders placed today.  Patient is in agreement with this plan.  Counseled patient to call our office immediately or proceed to the emergency room if he develops thick, ketchup like urinary output, passes large clots in the urine, or develops the inability to urinate.  He expressed understanding. - Urinalysis, Complete - CT HEMATURIA WORKUP  Return in about 2 weeks (around 12/17/2019) for Cysto and CTU results.  Debroah Loop, PA-C  [place],  [place], [place] [address] [phone]

## 2019-12-03 NOTE — Patient Instructions (Addendum)
The imaging department will contact you to schedule your CT scan. It is okay if you can't get this done before your cystoscopy appointment with Ruben Green.  If you develop thick, ketchup-like urine, start passing large blood clots in your urine, or feel like you cannot urinate, call our office immediately. If this happens outside of our office hours (8am-5pm Monday-Friday), go to the Emergency Department immediately.  Hematuria, Adult Hematuria is blood in the urine. Blood may be visible in the urine, or it may be identified with a test. This condition can be caused by infections of the bladder, urethra, kidney, or prostate. Other possible causes include:  Kidney stones.  Cancer of the urinary tract.  Too much calcium in the urine.  Conditions that are passed from parent to child (inherited conditions).  Exercise that requires a lot of energy. Infections can usually be treated with medicine, and a kidney stone usually will pass through your urine. If neither of these is the cause of your hematuria, more tests may be needed to identify the cause of your symptoms. It is very important to tell your health care provider about any blood in your urine, even if it is painless or the blood stops without treatment. Blood in the urine, when it happens and then stops and then happens again, can be a symptom of a very serious condition, including cancer. There is no pain in the initial stages of many urinary cancers. Follow these instructions at home: Medicines  Take over-the-counter and prescription medicines only as told by your health care provider.  If you were prescribed an antibiotic medicine, take it as told by your health care provider. Do not stop taking the antibiotic even if you start to feel better. Eating and drinking  Drink enough fluid to keep your urine clear or pale yellow. It is recommended that you drink 3-4 quarts (2.8-3.8 L) a day. If you have been diagnosed with an infection, it  is recommended that you drink cranberry juice in addition to large amounts of water.  Avoid caffeine, tea, and carbonated beverages. These tend to irritate the bladder.  Avoid alcohol because it may irritate the prostate (men). General instructions  If you have been diagnosed with a kidney stone, follow your health care provider's instructions about straining your urine to catch the stone.  Empty your bladder often. Avoid holding urine for long periods of time.  If you are male: ? After a bowel movement, wipe from front to back and use each piece of toilet paper only once. ? Empty your bladder before and after sex.  Pay attention to any changes in your symptoms. Tell your health care provider about any changes or any new symptoms.  It is your responsibility to get your test results. Ask your health care provider, or the department performing the test, when your results will be ready.  Keep all follow-up visits as told by your health care provider. This is important. Contact a health care provider if:  You develop back pain.  You have a fever.  You have nausea or vomiting.  Your symptoms do not improve after 3 days.  Your symptoms get worse. Get help right away if:  You develop severe vomiting and are unable take medicine without vomiting.  You develop severe pain in your back or abdomen even though you are taking medicine.  You pass a large amount of blood in your urine.  You pass blood clots in your urine.  You feel very weak or  like you might faint.  You faint. Summary  Hematuria is blood in the urine. It has many possible causes.  It is very important that you tell your health care provider about any blood in your urine, even if it is painless or the blood stops without treatment.  Take over-the-counter and prescription medicines only as told by your health care provider.  Drink enough fluid to keep your urine clear or pale yellow. This information is not  intended to replace advice given to you by your health care provider. Make sure you discuss any questions you have with your health care provider. Document Revised: 10/29/2018 Document Reviewed: 07/07/2016 Elsevier Patient Education  2020 [employer].  Cystoscopy Cystoscopy is a procedure that is used to help diagnose and sometimes treat conditions that affect the lower urinary tract. The lower urinary tract includes the bladder and the urethra. The urethra is the tube that drains urine from the bladder. Cystoscopy is done using a thin, tube-shaped instrument with a light and camera at the end (cystoscope). The cystoscope may be hard or flexible, depending on the goal of the procedure. The cystoscope is inserted through the urethra, into the bladder. Cystoscopy may be recommended if you have:  Urinary tract infections that keep coming back.  Blood in the urine (hematuria).  An inability to control when you urinate (urinary incontinence) or an overactive bladder.  Unusual cells found in a urine sample.  A blockage in the urethra, such as a urinary stone.  Painful urination.  An abnormality in the bladder found during an intravenous pyelogram (IVP) or CT scan. Cystoscopy may also be done to remove a sample of tissue to be examined under a microscope (biopsy). Tell a health care provider about:  Any allergies you have.  All medicines you are taking, including vitamins, herbs, eye drops, creams, and over-the-counter medicines.  Any problems you or family members have had with anesthetic medicines.  Any blood disorders you have.  Any surgeries you have had.  Any medical conditions you have.  Whether you are pregnant or may be pregnant. What are the risks? Generally, this is a safe procedure. However, problems may occur, including:  Infection.  Bleeding.  Allergic reactions to medicines.  Damage to other structures or organs. What happens before the procedure?  Ask your  health care provider about: ? Changing or stopping your regular medicines. This is especially important if you are taking diabetes medicines or blood thinners. ? Taking medicines such as aspirin and ibuprofen. These medicines can thin your blood. Do not take these medicines unless your health care provider tells you to take them. ? Taking over-the-counter medicines, vitamins, herbs, and supplements.  Follow instructions from your health care provider about eating or drinking restrictions.  Ask your health care provider what steps will be taken to help prevent infection. These may include: ? Washing skin with a germ-killing soap. ? Taking antibiotic medicine.  You may have an exam or testing, such as: ? X-rays of the bladder, urethra, or kidneys. ? Urine tests to check for signs of infection.  Plan to have someone take you home from the hospital or clinic. What happens during the procedure?   You will be given one or more of the following: ? A medicine to help you relax (sedative). ? A medicine to numb the area (local anesthetic).  The area around the opening of your urethra will be cleaned.  The cystoscope will be passed through your urethra into your bladder.  Germ-free (  sterile) fluid will flow through the cystoscope to fill your bladder. The fluid will stretch your bladder so that your health care provider can clearly examine your bladder walls.  Your doctor will look at the urethra and bladder. Your doctor may take a biopsy or remove stones.  The cystoscope will be removed, and your bladder will be emptied. The procedure may vary among health care providers and hospitals. What can I expect after the procedure? After the procedure, it is common to have:  Some soreness or pain in your abdomen and urethra.  Urinary symptoms. These include: ? Mild pain or burning when you urinate. Pain should stop within a few minutes after you urinate. This may last for up to 1 week. ? A  small amount of blood in your urine for several days. ? Feeling like you need to urinate but producing only a small amount of urine. Follow these instructions at home: Medicines  Take over-the-counter and prescription medicines only as told by your health care provider.  If you were prescribed an antibiotic medicine, take it as told by your health care provider. Do not stop taking the antibiotic even if you start to feel better. General instructions  Return to your normal activities as told by your health care provider. Ask your health care provider what activities are safe for you.  Do not drive for 24 hours if you were given a sedative during your procedure.  Watch for any blood in your urine. If the amount of blood in your urine increases, call your health care provider.  Follow instructions from your health care provider about eating or drinking restrictions.  If a tissue sample was removed for testing (biopsy) during your procedure, it is up to you to get your test results. Ask your health care provider, or the department that is doing the test, when your results will be ready.  Drink enough fluid to keep your urine pale yellow.  Keep all follow-up visits as told by your health care provider. This is important. Contact a health care provider if you:  Have pain that gets worse or does not get better with medicine, especially pain when you urinate.  Have trouble urinating.  Have more blood in your urine. Get help right away if you:  Have blood clots in your urine.  Have abdominal pain.  Have a fever or chills.  Are unable to urinate. Summary  Cystoscopy is a procedure that is used to help diagnose and sometimes treat conditions that affect the lower urinary tract.  Cystoscopy is done using a thin, tube-shaped instrument with a light and camera at the end.  After the procedure, it is common to have some soreness or pain in your abdomen and urethra.  Watch for any  blood in your urine. If the amount of blood in your urine increases, call your health care provider.  If you were prescribed an antibiotic medicine, take it as told by your health care provider. Do not stop taking the antibiotic even if you start to feel better. This information is not intended to replace advice given to you by your health care provider. Make sure you discuss any questions you have with your health care provider. Document Revised: 05/27/2018 Document Reviewed: 05/27/2018 Elsevier Patient Education  [employer].

## 2019-12-07 LAB — MICROSCOPIC EXAMINATION
Bacteria, UA: NONE SEEN
Epithelial Cells (non renal): NONE SEEN /hpf (ref 0–10)
RBC, Urine: >30 /hpf — AB (ref 0–2)

## 2019-12-07 LAB — URINALYSIS, COMPLETE
Bilirubin, UA: NEGATIVE
Glucose, UA: NEGATIVE
Leukocytes,UA: NEGATIVE
Nitrite, UA: NEGATIVE
Specific Gravity, UA: 1.025 (ref 1.005–1.030)
Urobilinogen, Ur: 1 mg/dL (ref 0.2–1.0)
pH, UA: 6 (ref 5.0–7.5)

## 2019-12-08 ENCOUNTER — Telehealth: Payer: Self-pay

## 2019-12-08 NOTE — Telephone Encounter (Signed)
Form faxed in for PA on Lupron/Eligard.

## 2019-12-10 ENCOUNTER — Telehealth: Payer: Self-pay

## 2019-12-10 NOTE — Telephone Encounter (Signed)
Incoming approval of Lupron/Eligard, No PA required. Case [id number]

## 2019-12-15 ENCOUNTER — Other Ambulatory Visit: Payer: Self-pay

## 2019-12-15 ENCOUNTER — Ambulatory Visit
Admission: RE | Admit: 2019-12-15 | Discharge: 2019-12-15 | Disposition: A | Discharge status: Home / Self Care | Payer: PPO | Source: Ambulatory Visit | Attending: Physician Assistant | Admitting: Physician Assistant

## 2019-12-15 DIAGNOSIS — R310 Gross hematuria: Secondary | ICD-10-CM | POA: Diagnosis not present

## 2019-12-15 DIAGNOSIS — C61 Malignant neoplasm of prostate: Secondary | ICD-10-CM | POA: Diagnosis not present

## 2019-12-15 DIAGNOSIS — N400 Enlarged prostate without lower urinary tract symptoms: Secondary | ICD-10-CM | POA: Diagnosis not present

## 2019-12-15 DIAGNOSIS — N281 Cyst of kidney, acquired: Secondary | ICD-10-CM | POA: Diagnosis not present

## 2019-12-15 DIAGNOSIS — N323 Diverticulum of bladder: Secondary | ICD-10-CM | POA: Diagnosis not present

## 2019-12-15 LAB — POCT I-STAT CREATININE: Creatinine, Ser: 1.2 mg/dL (ref 0.61–1.24)

## 2019-12-15 MED ORDER — IOHEXOL 300 MG/ML  SOLN
125.0000 mL | Freq: Once | INTRAMUSCULAR | Status: AC | PRN
  Administered 2019-12-15: 125 mL via INTRAVENOUS

## 2019-12-17 ENCOUNTER — Ambulatory Visit: Payer: PPO | Admitting: Urology

## 2019-12-17 ENCOUNTER — Encounter: Payer: Self-pay | Admitting: Urology

## 2019-12-17 ENCOUNTER — Other Ambulatory Visit: Payer: Self-pay

## 2019-12-17 ENCOUNTER — Other Ambulatory Visit: Payer: Self-pay | Admitting: Urology

## 2019-12-17 VITALS — BP 151/73 | HR 47 | Ht 62.0 in | Wt 175.0 lb

## 2019-12-17 DIAGNOSIS — R310 Gross hematuria: Secondary | ICD-10-CM | POA: Diagnosis not present

## 2019-12-18 NOTE — Progress Notes (Signed)
   12/18/19  CC:  Chief Complaint  Patient presents with  . Cysto    HPI: Refer to [facility]'s note of 12/03/2019.  No recent hematuria.  CTU showed no upper tract abnormalities  Blood pressure (!) 151/73, pulse (!) 47, height 5\' 2"  (1.575 m), weight 175 lb (79.4 kg). NED. A&Ox3.   No respiratory distress   Abd soft, NT, ND Normal phallus with bilateral descended testicles  Cystoscopy Procedure Note  Patient identification was confirmed, informed consent was obtained, and patient was prepped using Betadine solution.  Lidocaine jelly was administered per urethral meatus.     Pre-Procedure: - Inspection reveals a normal caliber urethral meatus.  Procedure: The flexible cystoscope was introduced without difficulty - No urethral strictures/lesions are present. - Moderate lateral lobe enlargement with prominent hypervascularity prostate   - Mild elevation bladder neck - Bilateral ureteral orifices identified - Bladder mucosa  reveals no ulcers, tumors, or lesions - Prominent hypervascularity bladder mucosa, diverticulum left postero-lateral wall - No bladder stones - Mild trabeculation  Retroflexion shows no abnormalities   Post-Procedure: - Patient tolerated the procedure well  Assessment/ Plan: -Gross hematuria most likely secondary to radiation cystitis -No recurrent episodes -Consider hyperbaric oxygen therapy for recurrent persistent bleeding.   Abbie Sons, MD

## 2019-12-22 LAB — CYTOLOGY - NON PAP

## 2019-12-23 LAB — PATHOLOGY

## 2020-01-05 ENCOUNTER — Telehealth: Payer: Self-pay | Admitting: Urology

## 2020-01-05 NOTE — Telephone Encounter (Signed)
Patient's wife who is on his DPR called back.  I discussed the urine cytology results which were suspicious for high-grade urothelial carcinoma.  Options were discussed cystoscopy under anesthesia with bladder biopsy and bilateral retrograde pyelograms versus a repeat urine cytology 6 weeks after the initial specimen.  He would like to repeat the cytology and will schedule for mid April.  If still abnormal they are agreeable to scheduling cystoscopy with bladder biopsies.

## 2020-01-05 NOTE — Telephone Encounter (Signed)
Left VM to call back regarding abnormal urine cytology

## 2020-01-28 ENCOUNTER — Other Ambulatory Visit: Payer: Self-pay

## 2020-01-28 ENCOUNTER — Encounter: Payer: Self-pay | Admitting: Physician Assistant

## 2020-01-28 ENCOUNTER — Ambulatory Visit: Payer: PPO | Admitting: Physician Assistant

## 2020-01-28 VITALS — BP 154/79 | HR 79 | Temp 98.2°F | Ht 62.0 in | Wt 176.0 lb

## 2020-01-28 DIAGNOSIS — R310 Gross hematuria: Secondary | ICD-10-CM | POA: Diagnosis not present

## 2020-01-28 DIAGNOSIS — R300 Dysuria: Secondary | ICD-10-CM

## 2020-01-28 MED ORDER — PHENAZOPYRIDINE HCL 200 MG PO TABS: 200.0000 mg | ORAL_TABLET | Freq: Three times a day (TID) | ORAL | 0 refills | Status: DC | PRN

## 2020-01-28 MED ORDER — AMOXICILLIN-POT CLAVULANATE 875-125 MG PO TABS: 1.0000 | ORAL_TABLET | Freq: Two times a day (BID) | ORAL | 0 refills | Status: AC

## 2020-01-28 NOTE — H&P (View-Only) (Signed)
01/28/2020 4:19 PM   Ruben Green Ruben Green 1933-07-04 [id number]  CC: Chief Complaint  Patient presents with  . Dysuria    HPI: Ruben Green is a 86 y.o. male with prostate cancer s/p IMRT on ADT with a history of radiation proctitis in October 2020, gross hematuria with hematuria work-up in June 2021, and recent abnormal urine cytology awaiting repeat testing later this month who presents today for evaluation of dysuria.  Recent CT urogram with no evidence of metastatic disease, a small left posterior lateral bladder diverticulum with mild mucosal hyperenhancement, and bilateral Bosniak 1 renal cysts.  He underwent cystoscopy with Dr. Bernardo Heater on 12/17/2019 with findings of moderate lateral lobe enlargement with prominent hypervascularity of the prostate and prominent hypervascularity of the bladder mucosa with no apparent tumors or lesions.  Urine cytology returned suspicious for high-grade urothelial carcinoma.  Today he reports a 3 to 4-day history of pain with urination.  He denies fever, chills, nausea, vomiting, gross hematuria, and flank pain.  In-office UA today positive for 1+ blood, 2+ protein, and 3+ leukocyte esterase; urine microscopy with >30 WBCs/HPF.  PMH: Past Medical History:  Diagnosis Date  . Anxiety   . BPH with obstruction/lower urinary tract symptoms   . Chronic bronchitis ([place])   . COPD (chronic obstructive pulmonary disease) ([place])   . Elevated PSA   . Environmental and seasonal allergies   . HLD (hyperlipidemia)   . HOH (hard of hearing)   . HTN (hypertension)   . Incomplete bladder emptying   . Macular degeneration     Surgical History: Past Surgical History:  Procedure Laterality Date  . ABDOMINAL AORTIC ANEURYSM REPAIR    . CATARACT EXTRACTION W/PHACO Right 09/25/2016   Procedure: CATARACT EXTRACTION PHACO AND INTRAOCULAR LENS PLACEMENT (IOC);  Surgeon: Birder Robson, MD;  Location: [facility] ORS;  Service: Ophthalmology;  Laterality: Right;   [place] 01:01 AP% 17.4 CDE 10.75 Fluid pack lot # [id number]  . CATARACT EXTRACTION W/PHACO Left 10/16/2016   Procedure: CATARACT EXTRACTION PHACO AND INTRAOCULAR LENS PLACEMENT ([place]) Suture placed in Left eye;  Surgeon: Birder Robson, MD;  Location: [facility] ORS;  Service: Ophthalmology;  Laterality: Left;  [place] 2:06.8 AP% 22.2 CDE 28.17 Fluid pack lot # [id number]  . COLONOSCOPY WITH PROPOFOL N/A 03/31/2019   Procedure: COLONOSCOPY WITH PROPOFOL;  Surgeon: Lollie Sails, MD;  [facility] END[facility];  Service: Endoscopy;  Laterality: N/A;  . HERNIA REPAIR     x 2  . INGUINAL HERNIA REPAIR Right 12/02/2015   Procedure: HERNIA REPAIR INGUINAL ADULT;  Surgeon: Leonie Green, MD;  Location: [facility] ORS;  Service: General;  Laterality: Right;  . PROSTATE SURGERY  2012  . Right Carotid Endarterectomy      Home Medications:  Allergies as of 01/28/2020   No Known Allergies     Medication List       Accurate as of January 28, 2020  4:19 PM. If you have any questions, ask your nurse or doctor.        albuterol 108 (90 Base) MCG/ACT inhaler Commonly known as: VENTOLIN HFA Inhale 2 puffs into the lungs every 6 (six) hours as needed for wheezing or shortness of breath.   albuterol-ipratropium 18-103 MCG/ACT inhaler Commonly known as: COMBIVENT Inhale 1-2 puffs into the lungs every 4 (four) hours.   amLODipine 10 MG tablet Commonly known as: NORVASC Take 10 mg by mouth daily.   amoxicillin-clavulanate 875-125 MG tablet Commonly known as: AUGMENTIN Take 1 tablet by mouth 2 (two)  times daily for 7 days. Started by: Debroah Loop, PA-C   aspirin EC 81 MG tablet Take 81 mg by mouth daily.   atorvastatin 40 MG tablet Commonly known as: LIPITOR Take 40 mg by mouth daily.   finasteride 5 MG tablet Commonly known as: Proscar Take 1 tablet (5 mg total) by mouth daily.   furosemide 20 MG tablet Commonly known as: LASIX Take 20 mg by mouth daily.   gabapentin 100 MG  capsule Commonly known as: NEURONTIN Take 100 mg by mouth at bedtime.   hydrALAZINE 25 MG tablet Commonly known as: APRESOLINE Take 25 mg by mouth 2 (two) times daily.   LORazepam 0.5 MG tablet Commonly known as: ATIVAN Take 0.5 mg by mouth every 8 (eight) hours.   losartan 100 MG tablet Commonly known as: COZAAR Take 50 mg by mouth daily.   tamsulosin 0.4 MG Caps capsule Commonly known as: FLOMAX Take 1 capsule (0.4 mg total) by mouth daily.   Trelegy Ellipta 100-62.5-25 MCG/INH Aepb Generic drug: Fluticasone-Umeclidin-Vilant Inhale into the lungs.   Trelegy Ellipta 100-62.5-25 MCG/INH Aepb Generic drug: Fluticasone-Umeclidin-Vilant Inhale 1 puff into the lungs daily.       Allergies:  No Known Allergies  Family History: Family History  Problem Relation Age of Onset  . Cancer Brother        2000  . Prostate cancer Neg Hx   . Bladder Cancer Neg Hx   . Kidney cancer Neg Hx     Social History:   reports that he quit smoking about 21 years ago. He has quit using smokeless tobacco. He reports current alcohol use. He reports that he does not use drugs.  Physical Exam: BP (!) 154/79   Pulse 79   Temp 98.2 F (36.8 C) (Oral)   Ht 5\' 2"  (1.575 m)   Wt 176 lb (79.8 kg)   BMI 32.19 kg/m   Constitutional:  Alert and oriented, no acute distress, nontoxic appearing HEENT: , AT Cardiovascular: No clubbing, cyanosis, or edema Respiratory: Normal respiratory effort, no increased work of breathing Skin: No rashes, bruises or suspicious lesions Neurologic: Grossly intact, no focal deficits, moving all 4 extremities Psychiatric: Normal mood and affect  Laboratory Data: Results for orders placed or performed in visit on 01/28/20  CULTURE, URINE COMPREHENSIVE   Specimen: Urine   UR  Result Value Ref Range   Urine Culture, Comprehensive Final report (A)    Organism ID, Bacteria Enterococcus faecalis (A)    ANTIMICROBIAL SUSCEPTIBILITY Comment   Microscopic  Examination   Urine  Result Value Ref Range   WBC, UA >30 (H) 0 - 5 /hpf   RBC None seen 0 - 2 /hpf   Epithelial Cells (non renal) 0-10 0 - 10 /hpf   Bacteria, UA Few (A) None seen/Few  Urinalysis, Complete  Result Value Ref Range   Specific Gravity, UA 1.020 1.005 - 1.030   pH, UA 6.0 5.0 - 7.5   Color, UA Yellow Yellow   Appearance Ur Cloudy (A) Clear   Leukocytes,UA 3+ (A) Negative   Protein,UA 2+ (A) Negative/Trace   Glucose, UA Negative Negative   Ketones, UA Negative Negative   RBC, UA 1+ (A) Negative   Bilirubin, UA Negative Negative   Urobilinogen, Ur 1.0 0.2 - 1.0 mg/dL   Nitrite, UA Negative Negative   Microscopic Examination See below:    Assessment & Plan:   1. Dysuria 86 year old male with prostate cancer on ADT s/p IMRT with a recent history of gross  hematuria and abnormal urine cytology awaiting repeat testing presents with a 4-day history of dysuria.  UA notable for pyuria, which represents a significant change over previous testing.  We will start him on empiric Augmentin and send for culture for further evaluation.  I counseled him and his wife that I would contact him if his urine culture results indicated a necessary change in therapy.  They expressed understanding. - Urinalysis, Complete - CULTURE, URINE COMPREHENSIVE - amoxicillin-clavulanate (AUGMENTIN) 875-125 MG tablet; Take 1 tablet by mouth 2 (two) times daily for 7 days.  Dispense: 14 tablet; Refill: 0   Return if symptoms worsen or fail to improve, for Keep follow up as scheduled .  Debroah Loop, PA-C  [facility] [address] [place], [place] [address] [phone]

## 2020-01-28 NOTE — Progress Notes (Signed)
01/28/2020 4:19 PM   Ruben Green 1934-03-02 [id number]  CC: Chief Complaint  Patient presents with  . Dysuria    HPI: Ruben Green is a 86 y.o. male with prostate cancer s/p IMRT on ADT with a history of radiation proctitis in October 2020, gross hematuria with hematuria work-up in June 2021, and recent abnormal urine cytology awaiting repeat testing later this month who presents today for evaluation of dysuria.  Recent CT urogram with no evidence of metastatic disease, a small left posterior lateral bladder diverticulum with mild mucosal hyperenhancement, and bilateral Bosniak 1 renal cysts.  He underwent cystoscopy with Dr. Bernardo Heater on 12/17/2019 with findings of moderate lateral lobe enlargement with prominent hypervascularity of the prostate and prominent hypervascularity of the bladder mucosa with no apparent tumors or lesions.  Urine cytology returned suspicious for high-grade urothelial carcinoma.  Today he reports a 3 to 4-day history of pain with urination.  He denies fever, chills, nausea, vomiting, gross hematuria, and flank pain.  In-office UA today positive for 1+ blood, 2+ protein, and 3+ leukocyte esterase; urine microscopy with >30 WBCs/HPF.  PMH: Past Medical History:  Diagnosis Date  . Anxiety   . BPH with obstruction/lower urinary tract symptoms   . Chronic bronchitis ([place])   . COPD (chronic obstructive pulmonary disease) ([place])   . Elevated PSA   . Environmental and seasonal allergies   . HLD (hyperlipidemia)   . HOH (hard of hearing)   . HTN (hypertension)   . Incomplete bladder emptying   . Macular degeneration     Surgical History: Past Surgical History:  Procedure Laterality Date  . ABDOMINAL AORTIC ANEURYSM REPAIR    . CATARACT EXTRACTION W/PHACO Right 09/25/2016   Procedure: CATARACT EXTRACTION PHACO AND INTRAOCULAR LENS PLACEMENT (IOC);  Surgeon: Birder Robson, MD;  Location: [facility] ORS;  Service: Ophthalmology;  Laterality: Right;   [place] 01:01 AP% 17.4 CDE 10.75 Fluid pack lot # [id number]  . CATARACT EXTRACTION W/PHACO Left 10/16/2016   Procedure: CATARACT EXTRACTION PHACO AND INTRAOCULAR LENS PLACEMENT ([place]) Suture placed in Left eye;  Surgeon: Birder Robson, MD;  Location: [facility] ORS;  Service: Ophthalmology;  Laterality: Left;  [place] 2:06.8 AP% 22.2 CDE 28.17 Fluid pack lot # [id number]  . COLONOSCOPY WITH PROPOFOL N/A 03/31/2019   Procedure: COLONOSCOPY WITH PROPOFOL;  Surgeon: Lollie Sails, MD;  [facility];  Service: Endoscopy;  Laterality: N/A;  . HERNIA REPAIR     x 2  . INGUINAL HERNIA REPAIR Right 12/02/2015   Procedure: HERNIA REPAIR INGUINAL ADULT;  Surgeon: Leonie Green, MD;  Location: [facility] ORS;  Service: General;  Laterality: Right;  . PROSTATE SURGERY  2012  . Right Carotid Endarterectomy      Home Medications:  Allergies as of 01/28/2020   No Known Allergies     Medication List       Accurate as of January 28, 2020  4:19 PM. If you have any questions, ask your nurse or doctor.        albuterol 108 (90 Base) MCG/ACT inhaler Commonly known as: VENTOLIN HFA Inhale 2 puffs into the lungs every 6 (six) hours as needed for wheezing or shortness of breath.   albuterol-ipratropium 18-103 MCG/ACT inhaler Commonly known as: COMBIVENT Inhale 1-2 puffs into the lungs every 4 (four) hours.   amLODipine 10 MG tablet Commonly known as: NORVASC Take 10 mg by mouth daily.   amoxicillin-clavulanate 875-125 MG tablet Commonly known as: AUGMENTIN Take 1 tablet by mouth 2 (two)  times daily for 7 days. Started by: Debroah Loop, PA-C   aspirin EC 81 MG tablet Take 81 mg by mouth daily.   atorvastatin 40 MG tablet Commonly known as: LIPITOR Take 40 mg by mouth daily.   finasteride 5 MG tablet Commonly known as: Proscar Take 1 tablet (5 mg total) by mouth daily.   furosemide 20 MG tablet Commonly known as: LASIX Take 20 mg by mouth daily.   gabapentin 100 MG  capsule Commonly known as: NEURONTIN Take 100 mg by mouth at bedtime.   hydrALAZINE 25 MG tablet Commonly known as: APRESOLINE Take 25 mg by mouth 2 (two) times daily.   LORazepam 0.5 MG tablet Commonly known as: ATIVAN Take 0.5 mg by mouth every 8 (eight) hours.   losartan 100 MG tablet Commonly known as: COZAAR Take 50 mg by mouth daily.   tamsulosin 0.4 MG Caps capsule Commonly known as: FLOMAX Take 1 capsule (0.4 mg total) by mouth daily.   Trelegy Ellipta 100-62.5-25 MCG/INH Aepb Generic drug: Fluticasone-Umeclidin-Vilant Inhale into the lungs.   Trelegy Ellipta 100-62.5-25 MCG/INH Aepb Generic drug: Fluticasone-Umeclidin-Vilant Inhale 1 puff into the lungs daily.       Allergies:  No Known Allergies  Family History: Family History  Problem Relation Age of Onset  . Cancer Brother        2000  . Prostate cancer Neg Hx   . Bladder Cancer Neg Hx   . Kidney cancer Neg Hx     Social History:   reports that he quit smoking about 21 years ago. He has quit using smokeless tobacco. He reports current alcohol use. He reports that he does not use drugs.  Physical Exam: BP (!) 154/79   Pulse 79   Temp 98.2 F (36.8 C) (Oral)   Ht 5\' 2"  (1.575 m)   Wt 176 lb (79.8 kg)   BMI 32.19 kg/m   Constitutional:  Alert and oriented, no acute distress, nontoxic appearing HEENT: [place], AT Cardiovascular: No clubbing, cyanosis, or edema Respiratory: Normal respiratory effort, no increased work of breathing Skin: No rashes, bruises or suspicious lesions Neurologic: Grossly intact, no focal deficits, moving all 4 extremities Psychiatric: Normal mood and affect  Laboratory Data: Results for orders placed or performed in visit on 01/28/20  CULTURE, URINE COMPREHENSIVE   Specimen: Urine   UR  Result Value Ref Range   Urine Culture, Comprehensive Final report (A)    Organism ID, Bacteria Enterococcus faecalis (A)    ANTIMICROBIAL SUSCEPTIBILITY Comment   Microscopic  Examination   Urine  Result Value Ref Range   WBC, UA >30 (H) 0 - 5 /hpf   RBC None seen 0 - 2 /hpf   Epithelial Cells (non renal) 0-10 0 - 10 /hpf   Bacteria, UA Few (A) None seen/Few  Urinalysis, Complete  Result Value Ref Range   Specific Gravity, UA 1.020 1.005 - 1.030   pH, UA 6.0 5.0 - 7.5   Color, UA Yellow Yellow   Appearance Ur Cloudy (A) Clear   Leukocytes,UA 3+ (A) Negative   Protein,UA 2+ (A) Negative/Trace   Glucose, UA Negative Negative   Ketones, UA Negative Negative   RBC, UA 1+ (A) Negative   Bilirubin, UA Negative Negative   Urobilinogen, Ur 1.0 0.2 - 1.0 mg/dL   Nitrite, UA Negative Negative   Microscopic Examination See below:    Assessment & Plan:   1. Dysuria 86 year old male with prostate cancer on ADT s/p IMRT with a recent history of gross  hematuria and abnormal urine cytology awaiting repeat testing presents with a 4-day history of dysuria.  UA notable for pyuria, which represents a significant change over previous testing.  We will start him on empiric Augmentin and send for culture for further evaluation.  I counseled him and his wife that I would contact him if his urine culture results indicated a necessary change in therapy.  They expressed understanding. - Urinalysis, Complete - CULTURE, URINE COMPREHENSIVE - amoxicillin-clavulanate (AUGMENTIN) 875-125 MG tablet; Take 1 tablet by mouth 2 (two) times daily for 7 days.  Dispense: 14 tablet; Refill: 0   Return if symptoms worsen or fail to improve, for Keep follow up as scheduled .  Debroah Loop, PA-C  [facility] [address] [place], [place] [address] [phone]

## 2020-01-29 LAB — URINALYSIS, COMPLETE
Bilirubin, UA: NEGATIVE
Glucose, UA: NEGATIVE
Ketones, UA: NEGATIVE
Nitrite, UA: NEGATIVE
Specific Gravity, UA: 1.02 (ref 1.005–1.030)
Urobilinogen, Ur: 1 mg/dL (ref 0.2–1.0)
pH, UA: 6 (ref 5.0–7.5)

## 2020-01-29 LAB — MICROSCOPIC EXAMINATION
RBC, Urine: NONE SEEN /hpf (ref 0–2)
WBC, UA: >30 /hpf — ABNORMAL HIGH (ref 0–5)

## 2020-02-01 DIAGNOSIS — H353122 Nonexudative age-related macular degeneration, left eye, intermediate dry stage: Secondary | ICD-10-CM | POA: Diagnosis not present

## 2020-02-01 LAB — CULTURE, URINE COMPREHENSIVE

## 2020-02-08 NOTE — Progress Notes (Signed)
02/09/2020 3:48 PM   Ruben Green [birth year] [id number]  Referring provider: Tracie Harrier, MD [address] [facility] [place],  [place] [address]  Chief Complaint  Patient presents with  . Prostate Cancer    HPI: Ruben Green is a 86 y.o. male with prostate cancer and hematuria who presents today for follow up.  Prostate cancer Completed radiation therapy to his prostate and pelvic nodes in 09/2017 for Gleason's 8 (4+4) adenocarcinoma.  iPSA 12.7.   Component     Latest Ref Rng & Units 08/13/2019  Prostatic Specific Antigen     0.00 - 4.00 ng/mL <0.01    Hematuria (high risk) Former smoker.  CTU 11/2019 Adrenal glands are within normal limits.  Bilateral renal cysts, including a 3.0 cm right lower pole renal sinus cyst (series 17/image 42), benign (Bosniak I). No enhancing renal lesions.  No renal, ureteral, or bladder calculi.  No hydronephrosis.  Partially duplicated right proximal/mid ureter (series 17/image 53).  Small left posterolateral bladder diverticulum near the left ureteral orifice with mild mucosal hyperenhancement (series 9/image 66).  On delayed imaging, there are no convincing filling defects within the bilateral proximal collecting systems or opacified ureters. Left anterior bladder is drawn towards a left inguinal hernia (series 9/image 73).  Cystoscopy 12/2019 with Dr. Bernardo Heater Moderate lateral lobe enlargement with prominent hypervascularity prostate - Mild elevation bladder neck  Prominent hypervascularity bladder mucosa, diverticulum left postero-lateral wall.  Urine cytology SUSPICIOUS FOR HIGH-GRADE UROTHELIAL CARCINOMA.   PMH: Past Medical History:  Diagnosis Date  . Anxiety   . BPH with obstruction/lower urinary tract symptoms   . Chronic bronchitis ([place])   . COPD (chronic obstructive pulmonary disease) ([place])   . Elevated PSA   . Environmental and seasonal allergies   . HLD (hyperlipidemia)   . HOH (hard of  hearing)   . HTN (hypertension)   . Incomplete bladder emptying   . Macular degeneration     Surgical History: Past Surgical History:  Procedure Laterality Date  . ABDOMINAL AORTIC ANEURYSM REPAIR    . CATARACT EXTRACTION W/PHACO Right 09/25/2016   Procedure: CATARACT EXTRACTION PHACO AND INTRAOCULAR LENS PLACEMENT (IOC);  Surgeon: Birder Robson, MD;  Location: [facility] ORS;  Service: Ophthalmology;  Laterality: Right;  [place] 01:01 AP% 17.4 CDE 10.75 Fluid pack lot # [id number]  . CATARACT EXTRACTION W/PHACO Left 10/16/2016   Procedure: CATARACT EXTRACTION PHACO AND INTRAOCULAR LENS PLACEMENT ([place]) Suture placed in Left eye;  Surgeon: Birder Robson, MD;  Location: [facility] ORS;  Service: Ophthalmology;  Laterality: Left;  [place] 2:06.8 AP% 22.2 CDE 28.17 Fluid pack lot # [id number]  . COLONOSCOPY WITH PROPOFOL N/A 03/31/2019   Procedure: COLONOSCOPY WITH PROPOFOL;  Surgeon: Lollie Sails, MD;  Location[facility] ENDOSCOPY;  Service: Endoscopy;  Laterality: N/A;  . HERNIA REPAIR     x 2  . INGUINAL HERNIA REPAIR Right 12/02/2015   Procedure: HERNIA REPAIR INGUINAL ADULT;  Surgeon: Leonie Green, MD;  Location: [facility] ORS;  Service: General;  Laterality: Right;  . PROSTATE SURGERY  2012  . Right Carotid Endarterectomy      Home Medications:  Allergies as of 02/09/2020   No Known Allergies     Medication List       Accurate as of February 09, 2020  3:48 PM. If you have any questions, ask your nurse or doctor.        albuterol 108 (90 Base) MCG/ACT inhaler Commonly known as: VENTOLIN HFA Inhale 2 puffs into  the lungs every 6 (six) hours as needed for wheezing or shortness of breath.   albuterol-ipratropium 18-103 MCG/ACT inhaler Commonly known as: COMBIVENT Inhale 1-2 puffs into the lungs every 4 (four) hours.   amLODipine 10 MG tablet Commonly known as: NORVASC Take 10 mg by mouth daily.   aspirin EC 81 MG tablet Take 81 mg by mouth daily.   atorvastatin 40 MG tablet Commonly  known as: LIPITOR Take 40 mg by mouth daily.   finasteride 5 MG tablet Commonly known as: Proscar Take 1 tablet (5 mg total) by mouth daily.   furosemide 20 MG tablet Commonly known as: LASIX Take 20 mg by mouth daily.   gabapentin 100 MG capsule Commonly known as: NEURONTIN Take 100 mg by mouth at bedtime.   hydrALAZINE 25 MG tablet Commonly known as: APRESOLINE Take 25 mg by mouth 2 (two) times daily.   LORazepam 0.5 MG tablet Commonly known as: ATIVAN Take 0.5 mg by mouth every 8 (eight) hours.   losartan 100 MG tablet Commonly known as: COZAAR Take 50 mg by mouth daily.   tamsulosin 0.4 MG Caps capsule Commonly known as: FLOMAX Take 1 capsule (0.4 mg total) by mouth daily.   Trelegy Ellipta 100-62.5-25 MCG/INH Aepb Generic drug: Fluticasone-Umeclidin-Vilant Inhale into the lungs.   Trelegy Ellipta 100-62.5-25 MCG/INH Aepb Generic drug: Fluticasone-Umeclidin-Vilant Inhale 1 puff into the lungs daily.       Allergies: No Known Allergies  Family History: Family History  Problem Relation Age of Onset  . Cancer Brother        2000  . Prostate cancer Neg Hx   . Bladder Cancer Neg Hx   . Kidney cancer Neg Hx     Social History:  reports that he quit smoking about 21 years ago. He has quit using smokeless tobacco. He reports current alcohol use. He reports that he does not use drugs.  ROS: Pertinent ROS in HPI  Physical Exam: BP (!) 172/83   Pulse 81   Ht 5\' 2"  (1.575 m)   Wt 172 lb (78 kg)   BMI 31.46 kg/m   Constitutional:  Well nourished. Alert and oriented, No acute distress. HEENT: [place] AT, mask in place.  Trachea midline Cardiovascular: No clubbing, cyanosis, or edema. Respiratory: Normal respiratory effort, no increased work of breathing. Neurologic: Grossly intact, no focal deficits, moving all 4 extremities. Psychiatric: Normal mood and affect.  Laboratory Data: Lab Results  Component Value Date   WBC 3.9 09/10/2017   HGB 10.5 (L)  09/10/2017   HCT 32.1 (L) 09/10/2017   MCV 93.8 09/10/2017   PLT 134 (L) 09/10/2017    Lab Results  Component Value Date   CREATININE 1.20 12/15/2019    Urinalysis Component     Latest Ref Rng & Units 02/09/2020  Specific Gravity, UA     1.005 - 1.030 1.025  pH, UA     5.0 - 7.5 5.5  Color, UA     Yellow Yellow  Appearance Ur     Clear Clear  Leukocytes,UA     Negative Negative  Protein,UA     Negative/Trace Negative  Glucose, UA     Negative Negative  Ketones, UA     Negative Negative  RBC, UA     Negative Negative  Bilirubin, UA     Negative Negative  Urobilinogen, Ur     0.2 - 1.0 mg/dL 0.2  Nitrite, UA     Negative Negative  Microscopic Examination  See below:   Component     Latest Ref Rng & Units 02/09/2020  WBC, UA     0 - 5 /hpf 0-5  RBC     0 - 2 /hpf 3-10 (A)  Epithelial Cells (non renal)     0 - 10 /hpf 0-10  Bacteria, UA     None seen/Few None seen   I have reviewed the labs.   Pertinent Imaging: No imaging since last visit  Assessment & Plan:    1. Prostate cancer PSA pending Eligard given today RTC in 6 months for PSA and ADT injection  2. Gross hematuria CTU and cystoscopy completed 12/2019 - urine cytology positive  UA today with 3-10 RBC's Repeat cytology pending - if remains positive will need to be scheduled for cystoscopy, bilateral retrogrades and bladder biopsies    Return for Pending urine cytology results .  These notes generated with voice recognition software. I apologize for typographical errors.  Zara Council, PA-C  [facility] [address]  [place], [place] [address] [phone]

## 2020-02-09 ENCOUNTER — Encounter: Payer: Self-pay | Admitting: Urology

## 2020-02-09 ENCOUNTER — Other Ambulatory Visit: Payer: Self-pay

## 2020-02-09 ENCOUNTER — Ambulatory Visit: Payer: PPO | Admitting: Urology

## 2020-02-09 VITALS — BP 172/83 | HR 81 | Ht 62.0 in | Wt 172.0 lb

## 2020-02-09 DIAGNOSIS — R310 Gross hematuria: Secondary | ICD-10-CM

## 2020-02-09 DIAGNOSIS — C61 Malignant neoplasm of prostate: Secondary | ICD-10-CM | POA: Diagnosis not present

## 2020-02-09 MED ORDER — LEUPROLIDE ACETATE (6 MONTH) 45 MG ~~LOC~~ KIT
45.0000 mg | PACK | Freq: Once | SUBCUTANEOUS | Status: AC
  Administered 2020-02-09: 45 mg via SUBCUTANEOUS

## 2020-02-10 ENCOUNTER — Telehealth: Payer: Self-pay | Admitting: Family Medicine

## 2020-02-10 LAB — URINALYSIS, COMPLETE
Bilirubin, UA: NEGATIVE
Glucose, UA: NEGATIVE
Ketones, UA: NEGATIVE
Leukocytes,UA: NEGATIVE
Nitrite, UA: NEGATIVE
Protein,UA: NEGATIVE
RBC, UA: NEGATIVE
Specific Gravity, UA: 1.025 (ref 1.005–1.030)
Urobilinogen, Ur: 0.2 mg/dL (ref 0.2–1.0)
pH, UA: 5.5 (ref 5.0–7.5)

## 2020-02-10 LAB — MICROSCOPIC EXAMINATION: Bacteria, UA: NONE SEEN

## 2020-02-10 LAB — PSA: Prostate Specific Ag, Serum: <0.1 ng/mL (ref 0.0–4.0)

## 2020-02-10 NOTE — Telephone Encounter (Signed)
LMOM notified of result.

## 2020-02-10 NOTE — Telephone Encounter (Signed)
-----   Message from Nori Riis, PA-C sent at 02/10/2020  7:56 AM EDT ----- Please let Ruben Green know that his PSA remains undetectable.

## 2020-02-11 ENCOUNTER — Telehealth: Payer: Self-pay | Admitting: Family Medicine

## 2020-02-11 ENCOUNTER — Other Ambulatory Visit: Payer: Self-pay | Admitting: Radiology

## 2020-02-11 DIAGNOSIS — R8289 Other abnormal findings on cytological and histological examination of urine: Secondary | ICD-10-CM

## 2020-02-11 DIAGNOSIS — R310 Gross hematuria: Secondary | ICD-10-CM

## 2020-02-11 LAB — CYTOLOGY - NON PAP

## 2020-02-11 NOTE — Telephone Encounter (Signed)
Patient's wife notified and voiced understanding. She will wait for a call from Amy.

## 2020-02-11 NOTE — Telephone Encounter (Signed)
-----   Message from Nori Riis, PA-C sent at 02/11/2020  3:26 PM EDT ----- Please let Mr. Haze Rushing know that his second urine cytology has returned suspicious for cancerous cells, so we will go ahead and get him scheduled for cystoscopy and bladder biopsies at this time.

## 2020-02-15 ENCOUNTER — Other Ambulatory Visit: Payer: Self-pay

## 2020-02-15 ENCOUNTER — Other Ambulatory Visit: Payer: PPO

## 2020-02-15 DIAGNOSIS — R310 Gross hematuria: Secondary | ICD-10-CM

## 2020-02-15 DIAGNOSIS — R8289 Other abnormal findings on cytological and histological examination of urine: Secondary | ICD-10-CM

## 2020-02-15 DIAGNOSIS — C61 Malignant neoplasm of prostate: Secondary | ICD-10-CM

## 2020-02-16 ENCOUNTER — Encounter
Admission: RE | Admit: 2020-02-16 | Discharge: 2020-02-16 | Disposition: A | Discharge status: Home / Self Care | Payer: PPO | Source: Ambulatory Visit | Attending: Urology | Admitting: Urology

## 2020-02-16 HISTORY — DX: Anemia, unspecified: D64.9

## 2020-02-16 NOTE — Patient Instructions (Signed)
Your procedure is scheduled on: 02/23/20 Report to [place]. To find out your arrival time please call [phone] between 1PM - 3PM on 02/19/20.  Remember: Instructions that are not followed completely may result in serious medical risk, up to and including death, or upon the discretion of your surgeon and anesthesiologist your surgery may need to be rescheduled.     _X__ 1. Do not eat food after midnight the night before your procedure.                 No gum chewing or hard candies. You may drink clear liquids up to 2 hours                 before you are scheduled to arrive for your surgery- DO not drink clear                 liquids within 2 hours of the start of your surgery.                 Clear Liquids include:  water, apple juice without pulp, clear carbohydrate                 drink such as Clearfast or Gatorade, Black Coffee or Tea (Do not add                 anything to coffee or tea). Diabetics water only  __X__2.  On the morning of surgery brush your teeth with toothpaste and water, you                 may rinse your mouth with mouthwash if you wish.  Do not swallow any              toothpaste of mouthwash.     _X__ 3.  No Alcohol for 24 hours before or after surgery.   _X__ 4.  Do Not Smoke or use e-cigarettes For 24 Hours Prior to Your Surgery.                 Do not use any chewable tobacco products for at least 6 hours prior to                 surgery.  ____  5.  Bring all medications with you on the day of surgery if instructed.   __X__  6.  Notify your doctor if there is any change in your medical condition      (cold, fever, infections).     Do not wear jewelry, make-up, hairpins, clips or nail polish. Do not wear lotions, powders, or perfumes.  Do not shave 48 hours prior to surgery. Men may shave face and neck. Do not bring valuables to the hospital.    [facility] is not responsible for any belongings or  valuables.  Contacts, dentures/partials or body piercings may not be worn into surgery. Bring a case for your contacts, glasses or hearing aids, a denture cup will be supplied. Leave your suitcase in the car. After surgery it may be brought to your room. For patients admitted to the hospital, discharge time is determined by your treatment team.   Patients discharged the day of surgery will not be allowed to drive home.   Please read over the following fact sheets that you were given:   MRSA Information  __X__ Take these medicines the morning of surgery with A SIP OF WATER:  1. amLODipine (NORVASC) 10 MG tablet  2. finasteride (PROSCAR) 5 MG tablet  3. tamsulosin (FLOMAX) 0.4 MG CAPS capsule  4.  5.  6.  ____ Fleet Enema (as directed)   ____ Use CHG Soap/SAGE wipes as directed  __X__ Use inhalers on the day of surgery  ____ Stop metformin/Janumet/Farxiga 2 days prior to surgery    ____ Take 1/2 of usual insulin dose the night before surgery. No insulin the morning          of surgery.   ____ Stop Blood Thinners Coumadin/Plavix/Xarelto/Pleta/Pradaxa/Eliquis/Effient/Aspirin  on   Or contact your Surgeon, Cardiologist or Medical Doctor regarding  ability to stop your blood thinners  __X__ Stop Anti-inflammatories 7 days before surgery such as Advil, Ibuprofen, Motrin,  BC or Goodies Powder, Naprosyn, Naproxen, Aleve, Aspirin    __X__ Stop all herbal supplements, fish oil or vitamin E until after surgery.    ____ Bring C-Pap to the hospital.

## 2020-02-17 ENCOUNTER — Other Ambulatory Visit: Payer: Self-pay

## 2020-02-17 ENCOUNTER — Encounter
Admission: RE | Admit: 2020-02-17 | Discharge: 2020-02-17 | Disposition: A | Discharge status: Home / Self Care | Payer: PPO | Source: Ambulatory Visit | Attending: Urology | Admitting: Urology

## 2020-02-17 DIAGNOSIS — Z01818 Encounter for other preprocedural examination: Secondary | ICD-10-CM | POA: Diagnosis not present

## 2020-02-17 DIAGNOSIS — I10 Essential (primary) hypertension: Secondary | ICD-10-CM | POA: Insufficient documentation

## 2020-02-17 LAB — CULTURE, URINE COMPREHENSIVE

## 2020-02-17 LAB — CBC
HCT: 33.9 % — ABNORMAL LOW (ref 39.0–52.0)
Hemoglobin: 10.7 g/dL — ABNORMAL LOW (ref 13.0–17.0)
MCH: 29.9 pg (ref 26.0–34.0)
MCHC: 31.6 g/dL (ref 30.0–36.0)
MCV: 94.7 fL (ref 80.0–100.0)
Platelets: 198 10*3/uL (ref 150–400)
RBC: 3.58 MIL/uL — ABNORMAL LOW (ref 4.22–5.81)
RDW: 13.7 % (ref 11.5–15.5)
WBC: 5.2 10*3/uL (ref 4.0–10.5)
nRBC: 0 % (ref 0.0–0.2)

## 2020-02-19 ENCOUNTER — Other Ambulatory Visit
Admission: RE | Admit: 2020-02-19 | Discharge: 2020-02-19 | Disposition: A | Discharge status: Home / Self Care | Payer: PPO | Source: Ambulatory Visit | Attending: Urology | Admitting: Urology

## 2020-02-19 ENCOUNTER — Other Ambulatory Visit: Payer: Self-pay

## 2020-02-19 DIAGNOSIS — Z20822 Contact with and (suspected) exposure to covid-19: Secondary | ICD-10-CM | POA: Diagnosis not present

## 2020-02-19 DIAGNOSIS — Z01812 Encounter for preprocedural laboratory examination: Secondary | ICD-10-CM | POA: Diagnosis not present

## 2020-02-19 LAB — SARS CORONAVIRUS 2 (TAT 6-24 HRS): SARS Coronavirus 2: NEGATIVE

## 2020-02-22 MED ORDER — CEFAZOLIN SODIUM-DEXTROSE 1-4 GM/50ML-% IV SOLN
1.0000 g | INTRAVENOUS | Status: AC
  Administered 2020-02-23: 1 g via INTRAVENOUS

## 2020-02-23 ENCOUNTER — Encounter
Admission: RE | Disposition: A | Discharge status: Home / Self Care | Payer: Self-pay | Source: Home / Self Care | Attending: Urology

## 2020-02-23 ENCOUNTER — Ambulatory Visit
Admission: RE | Admit: 2020-02-23 | Discharge: 2020-02-23 | Disposition: A | Discharge status: Home / Self Care | Payer: PPO | Attending: Urology | Admitting: Urology

## 2020-02-23 ENCOUNTER — Ambulatory Visit: Payer: PPO

## 2020-02-23 ENCOUNTER — Other Ambulatory Visit: Payer: Self-pay

## 2020-02-23 ENCOUNTER — Ambulatory Visit: Payer: PPO | Admitting: Anesthesiology

## 2020-02-23 ENCOUNTER — Encounter: Payer: Self-pay | Admitting: Urology

## 2020-02-23 ENCOUNTER — Ambulatory Visit: Payer: PPO | Admitting: Urgent Care

## 2020-02-23 DIAGNOSIS — Z7951 Long term (current) use of inhaled steroids: Secondary | ICD-10-CM | POA: Diagnosis not present

## 2020-02-23 DIAGNOSIS — Z7982 Long term (current) use of aspirin: Secondary | ICD-10-CM | POA: Diagnosis not present

## 2020-02-23 DIAGNOSIS — N138 Other obstructive and reflux uropathy: Secondary | ICD-10-CM | POA: Insufficient documentation

## 2020-02-23 DIAGNOSIS — N323 Diverticulum of bladder: Secondary | ICD-10-CM | POA: Diagnosis not present

## 2020-02-23 DIAGNOSIS — Z79899 Other long term (current) drug therapy: Secondary | ICD-10-CM | POA: Diagnosis not present

## 2020-02-23 DIAGNOSIS — Z87891 Personal history of nicotine dependence: Secondary | ICD-10-CM | POA: Diagnosis not present

## 2020-02-23 DIAGNOSIS — D090 Carcinoma in situ of bladder: Secondary | ICD-10-CM | POA: Diagnosis not present

## 2020-02-23 DIAGNOSIS — R8281 Pyuria: Secondary | ICD-10-CM | POA: Diagnosis not present

## 2020-02-23 DIAGNOSIS — N401 Enlarged prostate with lower urinary tract symptoms: Secondary | ICD-10-CM | POA: Diagnosis not present

## 2020-02-23 DIAGNOSIS — F419 Anxiety disorder, unspecified: Secondary | ICD-10-CM | POA: Insufficient documentation

## 2020-02-23 DIAGNOSIS — I10 Essential (primary) hypertension: Secondary | ICD-10-CM | POA: Diagnosis not present

## 2020-02-23 DIAGNOSIS — Z923 Personal history of irradiation: Secondary | ICD-10-CM | POA: Insufficient documentation

## 2020-02-23 DIAGNOSIS — I2510 Atherosclerotic heart disease of native coronary artery without angina pectoris: Secondary | ICD-10-CM | POA: Diagnosis not present

## 2020-02-23 DIAGNOSIS — Z792 Long term (current) use of antibiotics: Secondary | ICD-10-CM | POA: Diagnosis not present

## 2020-02-23 DIAGNOSIS — E785 Hyperlipidemia, unspecified: Secondary | ICD-10-CM | POA: Diagnosis not present

## 2020-02-23 DIAGNOSIS — Z8546 Personal history of malignant neoplasm of prostate: Secondary | ICD-10-CM | POA: Insufficient documentation

## 2020-02-23 DIAGNOSIS — R8289 Other abnormal findings on cytological and histological examination of urine: Secondary | ICD-10-CM

## 2020-02-23 DIAGNOSIS — J449 Chronic obstructive pulmonary disease, unspecified: Secondary | ICD-10-CM | POA: Diagnosis not present

## 2020-02-23 DIAGNOSIS — R310 Gross hematuria: Secondary | ICD-10-CM | POA: Diagnosis not present

## 2020-02-23 HISTORY — PX: CYSTOSCOPY WITH BIOPSY: SHX5122

## 2020-02-23 HISTORY — PX: CYSTOSCOPY W/ RETROGRADES: SHX1426

## 2020-02-23 SURGERY — CYSTOSCOPY, WITH BIOPSY
Anesthesia: General

## 2020-02-23 MED ORDER — LACTATED RINGERS IV SOLN: INTRAVENOUS | Status: DC

## 2020-02-23 MED ORDER — LIDOCAINE HCL (CARDIAC) PF 100 MG/5ML IV SOSY
PREFILLED_SYRINGE | INTRAVENOUS | Status: DC | PRN
  Administered 2020-02-23: 100 mg via INTRAVENOUS

## 2020-02-23 MED ORDER — FENTANYL CITRATE (PF) 100 MCG/2ML IJ SOLN
INTRAMUSCULAR | Status: AC
  Filled 2020-02-23: qty 2

## 2020-02-23 MED ORDER — TRAMADOL HCL 50 MG PO TABS: 25.0000 mg | ORAL_TABLET | Freq: Four times a day (QID) | ORAL | 0 refills | Status: DC | PRN

## 2020-02-23 MED ORDER — PROPOFOL 10 MG/ML IV BOLUS
INTRAVENOUS | Status: DC | PRN
  Administered 2020-02-23: 130 mg via INTRAVENOUS
  Administered 2020-02-23: 40 mg via INTRAVENOUS

## 2020-02-23 MED ORDER — FENTANYL CITRATE (PF) 100 MCG/2ML IJ SOLN: 25.0000 ug | INTRAMUSCULAR | Status: DC | PRN

## 2020-02-23 MED ORDER — ORAL CARE MOUTH RINSE: 15.0000 mL | Freq: Once | OROMUCOSAL | Status: AC

## 2020-02-23 MED ORDER — HYDRALAZINE HCL 20 MG/ML IJ SOLN
10.0000 mg | Freq: Once | INTRAMUSCULAR | Status: AC
  Administered 2020-02-23: 10 mg via INTRAVENOUS
  Filled 2020-02-23: qty 0.5

## 2020-02-23 MED ORDER — PROPOFOL 10 MG/ML IV BOLUS
INTRAVENOUS | Status: AC
  Filled 2020-02-23: qty 20

## 2020-02-23 MED ORDER — PHENAZOPYRIDINE HCL 200 MG PO TABS: 200.0000 mg | ORAL_TABLET | Freq: Three times a day (TID) | ORAL | 0 refills | Status: DC | PRN

## 2020-02-23 MED ORDER — FAMOTIDINE 20 MG PO TABS: 20.0000 mg | ORAL_TABLET | Freq: Once | ORAL | Status: AC

## 2020-02-23 MED ORDER — ONDANSETRON HCL 4 MG/2ML IJ SOLN
INTRAMUSCULAR | Status: DC | PRN
  Administered 2020-02-23: 4 mg via INTRAVENOUS

## 2020-02-23 MED ORDER — CEFAZOLIN SODIUM-DEXTROSE 1-4 GM/50ML-% IV SOLN
INTRAVENOUS | Status: AC
  Filled 2020-02-23: qty 50

## 2020-02-23 MED ORDER — LIDOCAINE HCL (PF) 2 % IJ SOLN
INTRAMUSCULAR | Status: AC
  Filled 2020-02-23: qty 5

## 2020-02-23 MED ORDER — FENTANYL CITRATE (PF) 100 MCG/2ML IJ SOLN
INTRAMUSCULAR | Status: DC | PRN
Start: 2020-02-23 — End: 2020-02-23
  Administered 2020-02-23 (×3): 25 ug via INTRAVENOUS

## 2020-02-23 MED ORDER — IOPAMIDOL (ISOVUE-200) INJECTION 41%
INTRAVENOUS | Status: DC | PRN
  Administered 2020-02-23: 20 mL via INTRAVENOUS

## 2020-02-23 MED ORDER — FAMOTIDINE 20 MG PO TABS
ORAL_TABLET | ORAL | Status: AC
  Administered 2020-02-23: 20 mg via ORAL
  Filled 2020-02-23: qty 1

## 2020-02-23 MED ORDER — PHENYLEPHRINE HCL (PRESSORS) 10 MG/ML IV SOLN
INTRAVENOUS | Status: AC
  Filled 2020-02-23: qty 1

## 2020-02-23 MED ORDER — CHLORHEXIDINE GLUCONATE 0.12 % MT SOLN
OROMUCOSAL | Status: AC
  Administered 2020-02-23: 15 mL via OROMUCOSAL
  Filled 2020-02-23: qty 15

## 2020-02-23 MED ORDER — CHLORHEXIDINE GLUCONATE 0.12 % MT SOLN: 15.0000 mL | Freq: Once | OROMUCOSAL | Status: AC

## 2020-02-23 MED ORDER — ONDANSETRON HCL 4 MG/2ML IJ SOLN
INTRAMUSCULAR | Status: AC
  Filled 2020-02-23: qty 2

## 2020-02-23 MED ORDER — ONDANSETRON HCL 4 MG/2ML IJ SOLN: 4.0000 mg | Freq: Once | INTRAMUSCULAR | Status: DC | PRN

## 2020-02-23 MED ORDER — HYDRALAZINE HCL 20 MG/ML IJ SOLN
INTRAMUSCULAR | Status: AC
  Filled 2020-02-23: qty 1

## 2020-02-23 SURGICAL SUPPLY — 27 items
BAG DRAIN CYSTO-URO LG1000N (MISCELLANEOUS) ×4 IMPLANT
BRUSH SCRUB EZ  4% CHG (MISCELLANEOUS) ×2
BRUSH SCRUB EZ 1% IODOPHOR (MISCELLANEOUS) ×4 IMPLANT
BRUSH SCRUB EZ 4% CHG (MISCELLANEOUS) ×2 IMPLANT
CATH URETL 5X70 OPEN END (CATHETERS) ×4 IMPLANT
DRAPE UTILITY 15X26 TOWEL STRL (DRAPES) ×4 IMPLANT
DRSG TELFA 4X3 1S NADH ST (GAUZE/BANDAGES/DRESSINGS) ×4 IMPLANT
ELECT REM PT RETURN 9FT ADLT (ELECTROSURGICAL) ×4
ELECTRODE REM PT RTRN 9FT ADLT (ELECTROSURGICAL) ×2 IMPLANT
GLOVE BIOGEL PI IND STRL 7.5 (GLOVE) ×2 IMPLANT
GLOVE BIOGEL PI INDICATOR 7.5 (GLOVE) ×2
GOWN STRL REUS W/ TWL LRG LVL3 (GOWN DISPOSABLE) ×4 IMPLANT
GOWN STRL REUS W/ TWL XL LVL3 (GOWN DISPOSABLE) ×2 IMPLANT
GOWN STRL REUS W/TWL LRG LVL3 (GOWN DISPOSABLE) ×8
GOWN STRL REUS W/TWL XL LVL3 (GOWN DISPOSABLE) ×8 IMPLANT
GUIDEWIRE STR DUAL SENSOR (WIRE) ×4 IMPLANT
KIT TURNOVER CYSTO (KITS) ×4 IMPLANT
NDL SAFETY ECLIPSE 18X1.5 (NEEDLE) ×2 IMPLANT
NEEDLE HYPO 18GX1.5 SHARP (NEEDLE)
PACK CYSTO AR (MISCELLANEOUS) ×4 IMPLANT
SET CYSTO W/LG BORE CLAMP LF (SET/KITS/TRAYS/PACK) ×4 IMPLANT
SOL .9 NS 3000ML IRR  AL (IV SOLUTION) ×2
SOL .9 NS 3000ML IRR AL (IV SOLUTION) ×2
SOL .9 NS 3000ML IRR UROMATIC (IV SOLUTION) ×2 IMPLANT
SURGILUBE 2OZ TUBE FLIPTOP (MISCELLANEOUS) ×4 IMPLANT
WATER STERILE IRR 1000ML POUR (IV SOLUTION) ×4 IMPLANT
WATER STERILE IRR 3000ML UROMA (IV SOLUTION) ×6 IMPLANT

## 2020-02-23 NOTE — Anesthesia Postprocedure Evaluation (Signed)
Anesthesia Post Note  Patient: [name]  Procedure(s) Performed: CYSTOSCOPY WITH BIOPSY (N/A ) CYSTOSCOPY WITH RETROGRADE PYELOGRAM (Bilateral )  Patient location during evaluation: PACU Anesthesia Type: General Level of consciousness: awake and alert and oriented Pain management: pain level controlled Vital Signs Assessment: post-procedure vital signs reviewed and stable Respiratory status: spontaneous breathing, nonlabored ventilation and respiratory function stable Cardiovascular status: blood pressure returned to baseline and stable Postop Assessment: no signs of nausea or vomiting Anesthetic complications: no   No complications documented.   Last Vitals:  Vitals:   02/23/20 0932 02/23/20 1026  BP: (!) 215/93 (!) 187/82  Pulse: 70 75  Resp: 16 18  Temp: (!) 35.9 C (!) 36.1 C  SpO2: 99% 99%    Last Pain:  Vitals:   02/23/20 1026  TempSrc: Temporal  PainSc: 0-No pain                 Caedin Mogan

## 2020-02-23 NOTE — Progress Notes (Signed)
°   02/23/20 0745  Clinical Encounter Type  Visited With Family  Visit Type Initial  Referral From Chaplain  Consult/Referral To Chaplain  While rounding SDS waiting area, chaplain visited with pt's wife. She said all is well. Chaplain wished her the best and left.

## 2020-02-23 NOTE — Interval H&P Note (Signed)
History and Physical Interval Note: 86 y.o. male with a history of prostate cancer status post radiation with a history gross hematuria.  Cystoscopy showed cells suspicious for high-grade urothelial carcinoma.  On repeat findings were consistent.  CT urogram unremarkable.  He presents for cystoscopy with bladder biopsy and bilateral retrograde pyelograms.  All questions were answered and he desires to proceed. CV: RRR lungs: Clear   02/23/2020 7:24 AM  Ann G Sallade  has presented today for surgery, with the diagnosis of gross hematuria, abnormal urine cytology.  The various methods of treatment have been discussed with the patient and family. After consideration of risks, benefits and other options for treatment, the patient has consented to  Procedure(s): CYSTOSCOPY WITH BIOPSY (N/A) CYSTOSCOPY WITH RETROGRADE PYELOGRAM (Bilateral) as a surgical intervention.  The patient's history has been reviewed, patient examined, no change in status, stable for surgery.  I have reviewed the patient's chart and labs.  Questions were answered to the patient's satisfaction.     [place]

## 2020-02-23 NOTE — Op Note (Signed)
Preoperative diagnosis:  Gross hematuria Abnormal urine cytology  Postoperative diagnosis:  Same  Procedure: Cystoscopy with bladder biopsies/fulguration Bilateral retrograde pyelograms  Surgeon: Abbie Sons, MD  Anesthesia: General  Complications: None  Intraoperative findings:  Cystoscopy: Urethra normal in caliber without stricture; prostate hypervascular with moderate lateral lobe enlargement; bladder mucosa with hyperemia throughout; left lateral wall diverticulum with erythematous, irregular mucosa Left retrograde pyelogram: Mild dilation of the distal ureter without filling defect no hydronephrosis Right retrograde pyelogram: 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  Mild hydronephrosis/hydroureter of the lower pole moiety without filling defect  EBL: Minimal  Specimens: None  Indication: Ruben Green is a 86 y.o. male with a history of high-grade prostate cancer status post radiation.  He recently developed gross hematuria.  CT urogram was negative.  Office cystoscopy remarkable for hypervascularity however a urine cytology returned suspicious for high-grade urothelial carcinoma.  A follow-up cytology showed similar findings.  After reviewing the management options for treatment, he elected to proceed with the above surgical procedure(s). We have discussed the potential benefits and risks of the procedure, side effects of the proposed treatment, the likelihood of the patient achieving the goals of the procedure, and any potential problems that might occur during the procedure or recuperation. Informed consent has been obtained.  Description of procedure:  The patient was taken to the operating room and general anesthesia was induced.  The patient was placed in the dorsal lithotomy position, prepped and draped in the usual sterile fashion, and preoperative antibiotics were administered. A preoperative time-out was  performed.   A 21 French cystoscope was lubricated and passed per urethra.  The cystoscope was advanced proximally to the bladder with findings as described above.  The left ureteral orifice was in the superior aspect of the bladder wall diverticulum.  The orifice was cannulated with a Sensor wire and a ureteral catheter was advanced over the wire.  Retrograde pyelogram was performed with findings as described above.  The ureteral catheter was removed and an identical procedure was performed on the contralateral side.  The mucosal irregularity noted in the left bladder diverticulum was involving the inferior aspect away from the ureteral orifice.  Cold cup biopsies x3 were obtained of this area.  Random bladder biopsies of the right and left lateral walls, posterior wall and dome were then performed.  All biopsy sites were then fulgurated with the Bugbee electrode.  All visible mucosal irregularity noted in the bladder wall diverticulum was fulgurated.  At the completion of procedure the ureter orifice ease were effluxing clear urine bilaterally.  The bladder was emptied and the cystoscope was removed.  After anesthetic reversal he was transported to the PACU in stable  Plan: The patient and his wife will be notified with the pathology report and management options will be discussed based on the results.   Abbie Sons, M.D.

## 2020-02-23 NOTE — Discharge Instructions (Addendum)
Bladder Biopsy   Definition:  Transurethral Resection of the Bladder Tumor is a surgical procedure used to diagnose and remove tumors within the bladder. T  General instructions:     Your recent bladder surgery requires very little post hospital care but some definite precautions.  Despite the fact that no skin incisions were used, the area around the bladder incisions are raw and covered with scabs to promote healing and prevent bleeding. Certain precautions are needed to insure that the scabs are not disturbed over the next 2-4 weeks while the healing proceeds.  Because the raw surface inside your bladder and the irritating effects of urine you may expect frequency of urination and/or urgency (a stronger desire to urinate) and perhaps even getting up at night more often. This will usually resolve or improve slowly over the healing period. You may see some blood in your urine over the first 6 weeks. Do not be alarmed, even if the urine was clear for a while. Get off your feet and drink lots of fluids until clearing occurs. If you start to pass clots or don't improve call us.  Diet:  You may return to your normal diet immediately. Because of the raw surface of your bladder, alcohol, spicy foods, foods high in acid and drinks with caffeine may cause irritation or frequency and should be used in moderation. To keep your urine flowing freely and avoid constipation, drink plenty of fluids during the day (8-10 glasses). Tip: Avoid cranberry juice because it is very acidic.  Activity:  Your physical activity doesn't need to be restricted. However, if you are very active, you may see some blood in the urine. We suggest that you reduce your activity under the circumstances until the bleeding has stopped.  Bowels:  It is important to keep your bowels regular during the postoperative period. Straining with bowel movements can cause bleeding. A bowel movement every other day is reasonable. Use a mild  laxative if needed, such as milk of magnesia 2-3 tablespoons, or 2 Dulcolax tablets. Call if you continue to have problems. If you had been taking narcotics for pain, before, during or after your surgery, you may be constipated. Take a laxative if necessary.    Medication:  You should resume your pre-surgery medications unless told not to. In addition you may be given an antibiotic to prevent or treat infection. Antibiotics are not always necessary. All medication should be taken as prescribed until the bottles are finished unless you are having an unusual reaction to one of the drugs.  Prescriptions for tramadol and Pyridium were sent to pharmacy.  You will be contacted with your biopsy results  [facility] [address], [place], [place] [address] [phone]   AMBULATORY SURGERY  DISCHARGE INSTRUCTIONS   1) The drugs that you were given will stay in your system until tomorrow so for the next 24 hours you should not:  A) Drive an automobile B) Make any legal decisions C) Drink any alcoholic beverage   2) You may resume regular meals tomorrow.  Today it is better to start with liquids and gradually work up to solid foods.  You may eat anything you prefer, but it is better to start with liquids, then soup and crackers, and gradually work up to solid foods.   3) Please notify your doctor immediately if you have any unusual bleeding, trouble breathing, redness and pain at the surgery site, drainage, fever, or pain not relieved by medication.  4) Additional Instructions:        Please contact your physician with any problems or Same Day Surgery at [phone], Monday through Friday 6 am to 4 pm, or [place] at [facility] number at [phone].

## 2020-02-23 NOTE — Transfer of Care (Signed)
Immediate Anesthesia Transfer of Care Note  Patient: Ruben Green  Procedure(s) Performed: CYSTOSCOPY WITH BIOPSY (N/A ) CYSTOSCOPY WITH RETROGRADE PYELOGRAM (Bilateral )  Patient Location: PACU  Anesthesia Type:General  Level of Consciousness: awake, alert  and oriented  Airway & Oxygen Therapy: Patient Spontanous Breathing and Patient connected to face mask oxygen  Post-op Assessment: Report given to RN and Post -op Vital signs reviewed and stable  Post vital signs: Reviewed and stable  Last Vitals:  Vitals Value Taken Time  BP 196/95 02/23/20 0825  Temp    Pulse 79 02/23/20 0829  Resp 9 02/23/20 0829  SpO2 100 % 02/23/20 0829  Vitals shown include unvalidated device data.  Last Pain:  Vitals:   02/23/20 0631  TempSrc: Temporal  PainSc: 0-No pain         Complications: No complications documented.

## 2020-02-23 NOTE — Anesthesia Procedure Notes (Signed)
Procedure Name: LMA Insertion Performed by: Rona Ravens, CRNA Pre-anesthesia Checklist: Patient identified, Emergency Drugs available, Suction available, Patient being monitored and Timeout performed Patient Re-evaluated:Patient Re-evaluated prior to induction Oxygen Delivery Method: Circle system utilized Preoxygenation: Pre-oxygenation with 100% oxygen Induction Type: IV induction Ventilation: Mask ventilation without difficulty LMA: LMA inserted LMA Size: 5.0 Number of attempts: 3 Tube secured with: Tape

## 2020-02-23 NOTE — Anesthesia Preprocedure Evaluation (Addendum)
Anesthesia Evaluation  Patient identified by MRN, date of birth, ID band Patient awake    Reviewed: Allergy & Precautions, NPO status , Patient's Chart, lab work & pertinent test results  History of Anesthesia Complications Negative for: history of anesthetic complications  Airway Mallampati: III       Dental   Pulmonary neg sleep apnea, COPD,  COPD inhaler, Not current smoker, former smoker,           Cardiovascular hypertension, Pt. on medications (-) Past MI and (-) CHF (-) dysrhythmias (-) Valvular Problems/Murmurs     Neuro/Psych neg Seizures Anxiety    GI/Hepatic Neg liver ROS, neg GERD  ,  Endo/Other  neg diabetes  Renal/GU negative Renal ROS     Musculoskeletal   Abdominal   Peds  Hematology  (+) anemia ,   Anesthesia Other Findings   Reproductive/Obstetrics                            Anesthesia Physical Anesthesia Plan  ASA: III  Anesthesia Plan: General   Post-op Pain Management:    Induction: Intravenous  PONV Risk Score and Plan: 2 and Ondansetron and Dexamethasone  Airway Management Planned: LMA  Additional Equipment:   Intra-op Plan:   Post-operative Plan:   Informed Consent: I have reviewed the patients History and Physical, chart, labs and discussed the procedure including the risks, benefits and alternatives for the proposed anesthesia with the patient or authorized representative who has indicated his/her understanding and acceptance.       Plan Discussed with:   Anesthesia Plan Comments:         Anesthesia Quick Evaluation

## 2020-02-24 LAB — SURGICAL PATHOLOGY

## 2020-02-25 DIAGNOSIS — R739 Hyperglycemia, unspecified: Secondary | ICD-10-CM | POA: Diagnosis not present

## 2020-02-25 DIAGNOSIS — D649 Anemia, unspecified: Secondary | ICD-10-CM | POA: Diagnosis not present

## 2020-02-25 DIAGNOSIS — R5383 Other fatigue: Secondary | ICD-10-CM | POA: Diagnosis not present

## 2020-02-25 DIAGNOSIS — R5381 Other malaise: Secondary | ICD-10-CM | POA: Diagnosis not present

## 2020-02-25 DIAGNOSIS — I10 Essential (primary) hypertension: Secondary | ICD-10-CM | POA: Diagnosis not present

## 2020-02-25 DIAGNOSIS — F411 Generalized anxiety disorder: Secondary | ICD-10-CM | POA: Diagnosis not present

## 2020-02-25 DIAGNOSIS — C61 Malignant neoplasm of prostate: Secondary | ICD-10-CM | POA: Diagnosis not present

## 2020-02-25 DIAGNOSIS — E782 Mixed hyperlipidemia: Secondary | ICD-10-CM | POA: Diagnosis not present

## 2020-03-01 ENCOUNTER — Telehealth: Payer: Self-pay | Admitting: *Deleted

## 2020-03-01 ENCOUNTER — Other Ambulatory Visit: Payer: PPO | Admitting: Urology

## 2020-03-01 ENCOUNTER — Telehealth: Payer: Self-pay | Admitting: Urology

## 2020-03-01 NOTE — Telephone Encounter (Signed)
I contacted Ruben Green' wife who is on his DPR regarding his bladder pathology report and she requested that I speak with their son Ruben Green.  He had to biopsies that were positive for carcinoma in situ.  We discussed this finding and that treatment is recommended to prevent progression into muscle invasive bladder cancer.  We discussed the standard recommended treatment of induction BCG weekly x6 weeks.  The most common side effect was discussed of urinary frequency, urgency, bladder irritation and less common side effects of flulike symptoms and BCG sepsis.  He was informed we typically wait approximately 4 weeks after biopsy before starting.  He had no questions and desires to schedule.

## 2020-03-01 NOTE — Telephone Encounter (Signed)
Patient called in about his results on 02/23/2020

## 2020-03-02 NOTE — Telephone Encounter (Signed)
Genia Del called and left a voicemail and wants a return call to discuss the conversation that you had with his mother yesterday. He said they had more questions about the BCG treatments and want to discuss it and may want to set up an app with [facility] first before scheduling.  Can you call Dino please?

## 2020-03-03 DIAGNOSIS — E782 Mixed hyperlipidemia: Secondary | ICD-10-CM | POA: Diagnosis not present

## 2020-03-03 DIAGNOSIS — Z0000 Encounter for general adult medical examination without abnormal findings: Secondary | ICD-10-CM | POA: Diagnosis not present

## 2020-03-03 DIAGNOSIS — D649 Anemia, unspecified: Secondary | ICD-10-CM | POA: Diagnosis not present

## 2020-03-03 DIAGNOSIS — K5520 Angiodysplasia of colon without hemorrhage: Secondary | ICD-10-CM | POA: Diagnosis not present

## 2020-03-03 DIAGNOSIS — R7309 Other abnormal glucose: Secondary | ICD-10-CM | POA: Diagnosis not present

## 2020-03-03 DIAGNOSIS — I10 Essential (primary) hypertension: Secondary | ICD-10-CM | POA: Diagnosis not present

## 2020-03-03 DIAGNOSIS — I2510 Atherosclerotic heart disease of native coronary artery without angina pectoris: Secondary | ICD-10-CM | POA: Diagnosis not present

## 2020-03-03 DIAGNOSIS — Z23 Encounter for immunization: Secondary | ICD-10-CM | POA: Diagnosis not present

## 2020-03-03 DIAGNOSIS — F411 Generalized anxiety disorder: Secondary | ICD-10-CM | POA: Diagnosis not present

## 2020-03-03 DIAGNOSIS — E538 Deficiency of other specified B group vitamins: Secondary | ICD-10-CM | POA: Diagnosis not present

## 2020-03-03 DIAGNOSIS — J42 Unspecified chronic bronchitis: Secondary | ICD-10-CM | POA: Diagnosis not present

## 2020-03-03 DIAGNOSIS — R790 Abnormal level of blood mineral: Secondary | ICD-10-CM | POA: Diagnosis not present

## 2020-03-03 NOTE — Telephone Encounter (Signed)
Spoke with patient's son he states that they would like an in person visit with Dr. Bernardo Heater to discuss BCG treatment further. Description and instructions were discussed today as well but he states he would still like an in person with his father. He states patient is unsure he would like to go ahead with treatment. Apt was made

## 2020-03-04 ENCOUNTER — Ambulatory Visit (INDEPENDENT_AMBULATORY_CARE_PROVIDER_SITE_OTHER): Payer: PPO | Admitting: Urology

## 2020-03-04 ENCOUNTER — Encounter: Payer: Self-pay | Admitting: Urology

## 2020-03-04 ENCOUNTER — Other Ambulatory Visit: Payer: Self-pay

## 2020-03-04 VITALS — BP 138/70 | HR 84 | Ht 63.0 in | Wt 175.4 lb

## 2020-03-04 DIAGNOSIS — D090 Carcinoma in situ of bladder: Secondary | ICD-10-CM | POA: Diagnosis not present

## 2020-03-04 NOTE — Progress Notes (Signed)
03/04/2020 1:34 PM   Ruben Green November 19, 1933 [id number]  Referring provider: Tracie Harrier, MD [address] [place] [facility] [place],  [place] [address]  Chief Complaint  Patient presents with  . Follow-up    HPI: 86 y.o. male with history of prostate cancer status post radiation with abnormal urine cytology.  Recent bladder biopsies were positive for carcinoma in situ.  I spoke with his son earlier this week regarding the path results and recommendations of BCG.  They were going to schedule however elected to come in to discuss further.  He presents today with his other son He had no post biopsy complaints.  PMH: Past Medical History:  Diagnosis Date  . Anemia   . Anxiety   . BPH with obstruction/lower urinary tract symptoms   . Chronic bronchitis ([place])   . COPD (chronic obstructive pulmonary disease) ([place])   . Elevated PSA   . Environmental and seasonal allergies   . HLD (hyperlipidemia)   . HOH (hard of hearing)   . HTN (hypertension)   . Incomplete bladder emptying   . Macular degeneration     Surgical History: Past Surgical History:  Procedure Laterality Date  . ABDOMINAL AORTIC ANEURYSM REPAIR    . CATARACT EXTRACTION W/PHACO Right 09/25/2016   Procedure: CATARACT EXTRACTION PHACO AND INTRAOCULAR LENS PLACEMENT (IOC);  Surgeon: Birder Robson, MD;  Location: [facility] ORS;  Service: Ophthalmology;  Laterality: Right;  [place] 01:01 AP% 17.4 CDE 10.75 Fluid pack lot # [id number]  . CATARACT EXTRACTION W/PHACO Left 10/16/2016   Procedure: CATARACT EXTRACTION PHACO AND INTRAOCULAR LENS PLACEMENT ([place]) Suture placed in Left eye;  Surgeon: Birder Robson, MD;  Location: [facility] ORS;  Service: Ophthalmology;  Laterality: Left;  [place] 2:06.8 AP% 22.2 CDE 28.17 Fluid pack lot # [id number]  . COLONOSCOPY WITH PROPOFOL N/A 03/31/2019   Procedure: COLONOSCOPY WITH PROPOFOL;  Surgeon: Lollie Sails, MD;  Location: [facility] ENDOSCOPY;  Service: Endoscopy;  Laterality:  N/A;  . CYSTOSCOPY W/ RETROGRADES Bilateral 02/23/2020   Procedure: CYSTOSCOPY WITH RETROGRADE PYELOGRAM;  Surgeon: Abbie Sons, MD;  Location: [facility] ORS;  Service: Urology;  Laterality: Bilateral;  . CYSTOSCOPY WITH BIOPSY N/A 02/23/2020   Procedure: CYSTOSCOPY WITH BIOPSY;  Surgeon: Abbie Sons, MD;  Location: [facility] ORS;  Service: Urology;  Laterality: N/A;  . HERNIA REPAIR     x 2  . INGUINAL HERNIA REPAIR Right 12/02/2015   Procedure: HERNIA REPAIR INGUINAL ADULT;  Surgeon: Leonie Green, MD;  Location: [facility] ORS;  Service: General;  Laterality: Right;  . PROSTATE SURGERY  2012  . Right Carotid Endarterectomy      Home Medications:  Allergies as of 03/04/2020   No Known Allergies     Medication List       Accurate as of March 04, 2020  1:34 PM. If you have any questions, ask your nurse or doctor.        albuterol 108 (90 Base) MCG/ACT inhaler Commonly known as: VENTOLIN HFA Inhale 2 puffs into the lungs every 6 (six) hours as needed for wheezing or shortness of breath.   albuterol-ipratropium 18-103 MCG/ACT inhaler Commonly known as: COMBIVENT Inhale 1-2 puffs into the lungs every 4 (four) hours.   amLODipine 10 MG tablet Commonly known as: NORVASC Take 10 mg by mouth daily.   aspirin EC 81 MG tablet Take 81 mg by mouth daily.   atorvastatin 40 MG tablet Commonly known as: LIPITOR Take 40 mg by mouth at bedtime.   finasteride  5 MG tablet Commonly known as: Proscar Take 1 tablet (5 mg total) by mouth daily.   furosemide 20 MG tablet Commonly known as: LASIX Take 20 mg by mouth daily.   gabapentin 100 MG capsule Commonly known as: NEURONTIN Take 100 mg by mouth at bedtime.   hydrALAZINE 25 MG tablet Commonly known as: APRESOLINE Take 25 mg by mouth 2 (two) times daily.   iron polysaccharides 150 MG capsule Commonly known as: NIFEREX Take by mouth.   LORazepam 0.5 MG tablet Commonly known as: ATIVAN Take 0.5 mg by mouth every 8 (eight)  hours.   losartan 100 MG tablet Commonly known as: COZAAR Take 100 mg by mouth daily.   phenazopyridine 200 MG tablet Commonly known as: PYRIDIUM Take 1 tablet (200 mg total) by mouth 3 (three) times daily as needed (burning with urination).   tamsulosin 0.4 MG Caps capsule Commonly known as: FLOMAX Take 1 capsule (0.4 mg total) by mouth daily.   traMADol 50 MG tablet Commonly known as: ULTRAM Take 0.5-1 tablets (25-50 mg total) by mouth every 6 (six) hours as needed for moderate pain.   Trelegy Ellipta 100-62.5-25 MCG/INH Aepb Generic drug: Fluticasone-Umeclidin-Vilant Inhale 1 puff into the lungs daily.   Trelegy Ellipta 100-62.5-25 MCG/INH Aepb Generic drug: Fluticasone-Umeclidin-Vilant Inhale 1 puff into the lungs daily.       Allergies: No Known Allergies  Family History: Family History  Problem Relation Age of Onset  . Cancer Brother        2000  . Prostate cancer Neg Hx   . Bladder Cancer Neg Hx   . Kidney cancer Neg Hx     Social History:  reports that he quit smoking about 21 years ago. He has quit using smokeless tobacco. He reports current alcohol use. He reports that he does not use drugs.   Physical Exam: BP 138/70   Pulse 84   Ht 5\' 3"  (1.6 m)   Wt 175 lb 6.4 oz (79.6 kg)   BMI 31.07 kg/m   Constitutional:  Alert and oriented, No acute distress. HEENT: [place] AT, moist mucus membranes.  Trachea midline, no masses. Cardiovascular: No clubbing, cyanosis, or edema. Respiratory: Normal respiratory effort, no increased work of breathing. Skin: No rashes, bruises or suspicious lesions. Neurologic: Grossly intact, no focal deficits, moving all 4 extremities. Psychiatric: Normal mood and affect.   Assessment & Plan:    1.  Carcinoma in situ bladder  Pathology report discussed in detail with Mr. Strength and his son  The standard treatment with induction BCG weekly x6 weeks was discussed and recommended  Most common side effects of irritative  voiding symptoms, flulike symptoms and rarely BCG sepsis were discussed  They have elected to proceed and schedule and will tentatively start approximately 3-4 weeks after bladder biopsy  I spent 20 total minutes on the day of the encounter including pre-visit review of the medical record, face-to-face time with the patient, and post visit ordering of labs/imaging/tests.   Abbie Sons, [facility] [address], [place], [place] [address] [phone]

## 2020-03-04 NOTE — Patient Instructions (Signed)

## 2020-03-05 ENCOUNTER — Encounter: Payer: Self-pay | Admitting: Urology

## 2020-03-05 DIAGNOSIS — D090 Carcinoma in situ of bladder: Secondary | ICD-10-CM | POA: Insufficient documentation

## 2020-04-28 DIAGNOSIS — I6523 Occlusion and stenosis of bilateral carotid arteries: Secondary | ICD-10-CM | POA: Diagnosis not present

## 2020-04-28 DIAGNOSIS — I2510 Atherosclerotic heart disease of native coronary artery without angina pectoris: Secondary | ICD-10-CM | POA: Diagnosis not present

## 2020-04-28 DIAGNOSIS — I714 Abdominal aortic aneurysm, without rupture: Secondary | ICD-10-CM | POA: Diagnosis not present

## 2020-04-28 DIAGNOSIS — I10 Essential (primary) hypertension: Secondary | ICD-10-CM | POA: Diagnosis not present

## 2020-04-28 DIAGNOSIS — E782 Mixed hyperlipidemia: Secondary | ICD-10-CM | POA: Diagnosis not present

## 2020-04-29 ENCOUNTER — Ambulatory Visit (INDEPENDENT_AMBULATORY_CARE_PROVIDER_SITE_OTHER): Payer: PPO | Admitting: Physician Assistant

## 2020-04-29 ENCOUNTER — Other Ambulatory Visit: Payer: Self-pay

## 2020-04-29 ENCOUNTER — Encounter: Payer: Self-pay | Admitting: Physician Assistant

## 2020-04-29 VITALS — BP 125/73 | HR 73

## 2020-04-29 DIAGNOSIS — D090 Carcinoma in situ of bladder: Secondary | ICD-10-CM | POA: Diagnosis not present

## 2020-04-29 LAB — URINALYSIS, COMPLETE
Bilirubin, UA: NEGATIVE
Glucose, UA: NEGATIVE
Ketones, UA: NEGATIVE
Leukocytes,UA: NEGATIVE
Nitrite, UA: NEGATIVE
Protein,UA: NEGATIVE
RBC, UA: NEGATIVE
Specific Gravity, UA: 1.02 (ref 1.005–1.030)
Urobilinogen, Ur: 0.2 mg/dL (ref 0.2–1.0)
pH, UA: 6 (ref 5.0–7.5)

## 2020-04-29 LAB — MICROSCOPIC EXAMINATION: Bacteria, UA: NONE SEEN

## 2020-04-29 MED ORDER — BCG LIVE 50 MG IS SUSR
3.2400 mL | Freq: Once | INTRAVESICAL | Status: AC
  Administered 2020-04-29: 81 mg via INTRAVESICAL

## 2020-04-29 NOTE — Patient Instructions (Signed)

## 2020-04-29 NOTE — Progress Notes (Signed)
BCG Bladder Instillation  BCG # 1 of 6  Due to Bladder Cancer patient is present today for a BCG treatment. Patient was cleaned and prepped in a sterile fashion with betadine. A 14FR coude catheter was inserted, urine return was noted 138ml, urine was yellow in color.  43ml of reconstituted BCG was instilled into the bladder. The catheter was then removed. Patient tolerated well, no complications were noted  Performed by: Debroah Loop, PA-C and Verlene Mayer, CMA  Additional notes: Patient remained in clinic for 30 minutes following instillation today for monitoring of hypersensitivity reaction; none noted.  We reviewed post-instillation instructions including holding the urine for 2 hours with quarter turns every 15 minutes and pouring bleach into the toilet with subsequent voids for 6 hours. Written instructions also provided today. He expressed understanding.  Follow up: 1 week for BCG #2 of 6

## 2020-05-02 DIAGNOSIS — H353212 Exudative age-related macular degeneration, right eye, with inactive choroidal neovascularization: Secondary | ICD-10-CM | POA: Diagnosis not present

## 2020-05-06 ENCOUNTER — Other Ambulatory Visit: Payer: Self-pay

## 2020-05-06 ENCOUNTER — Ambulatory Visit (INDEPENDENT_AMBULATORY_CARE_PROVIDER_SITE_OTHER): Payer: PPO | Admitting: Physician Assistant

## 2020-05-06 DIAGNOSIS — D090 Carcinoma in situ of bladder: Secondary | ICD-10-CM | POA: Diagnosis not present

## 2020-05-06 DIAGNOSIS — R310 Gross hematuria: Secondary | ICD-10-CM | POA: Diagnosis not present

## 2020-05-06 LAB — URINALYSIS, COMPLETE
Bilirubin, UA: NEGATIVE
Glucose, UA: NEGATIVE
Ketones, UA: NEGATIVE
Nitrite, UA: POSITIVE — AB
Specific Gravity, UA: 1.02 (ref 1.005–1.030)
Urobilinogen, Ur: 0.2 mg/dL (ref 0.2–1.0)
pH, UA: 7.5 (ref 5.0–7.5)

## 2020-05-06 LAB — MICROSCOPIC EXAMINATION: WBC, UA: >30 /hpf — AB (ref 0–5)

## 2020-05-06 MED ORDER — CIPROFLOXACIN HCL 250 MG PO TABS: 250.0000 mg | ORAL_TABLET | Freq: Two times a day (BID) | ORAL | 0 refills | Status: AC

## 2020-05-06 NOTE — Progress Notes (Signed)
Patient presented to the clinic today for a scheduled BCG instillation.  Urine grossly infected on UA, culture pending.  Patient reports dysuria.  Will treat for acute UTI today.  Prescription for Cipro 250 mg twice daily x7 days sent to pharmacy.  Patient to return to clinic next week for next scheduled BCG treatment.  We will add an additional treatment to his scheduled series to make up for today's missed instillation.  Additionally, patient wonders if he may pursue Covid booster given ongoing BCG.  Per NCCN guidelines, there is no contraindication to Covid vaccination while undergoing intravesical BCG.  Counseled patient to proceed as desired.  He expressed understanding.  Debroah Loop, PA-C 05/06/20 10:24 AM

## 2020-05-11 ENCOUNTER — Telehealth: Payer: Self-pay | Admitting: Physician Assistant

## 2020-05-11 LAB — CULTURE, URINE COMPREHENSIVE

## 2020-05-11 MED ORDER — CEFUROXIME AXETIL 250 MG PO TABS: 250.0000 mg | ORAL_TABLET | Freq: Two times a day (BID) | ORAL | 0 refills | Status: AC

## 2020-05-11 NOTE — Telephone Encounter (Signed)
Spoke with patients wife [name]. Notified her to pick up new ABX and discontinue Cipro and to keep next appt scheduled for 11/29. She repeated instructions back verbalizing understanding.

## 2020-05-11 NOTE — Telephone Encounter (Signed)
Please contact the patient and inform him that his recent urine culture has finalized and the bacteria he is growing does not respond fully to the Cipro I prescribed. I would like him to switch to cefuroxime 250mg  BID x5 days as an alternative; Rx sent to [facility]. He may stop Cipro and switch to cefuroxime ASAP and should keep his next scheduled follow up with me.

## 2020-05-13 ENCOUNTER — Ambulatory Visit: Payer: PPO | Admitting: Physician Assistant

## 2020-05-16 ENCOUNTER — Ambulatory Visit (INDEPENDENT_AMBULATORY_CARE_PROVIDER_SITE_OTHER): Payer: PPO | Admitting: Physician Assistant

## 2020-05-16 ENCOUNTER — Other Ambulatory Visit: Payer: Self-pay

## 2020-05-16 DIAGNOSIS — D090 Carcinoma in situ of bladder: Secondary | ICD-10-CM

## 2020-05-16 MED ORDER — BCG LIVE 50 MG IS SUSR
3.2400 mL | Freq: Once | INTRAVESICAL | Status: AC
  Administered 2020-05-16: 81 mg via INTRAVESICAL

## 2020-05-16 NOTE — Progress Notes (Signed)
BCG Bladder Instillation  BCG # 2 of 6  Due to Bladder Cancer patient is present today for a BCG treatment. Patient was cleaned and prepped in a sterile fashion with betadine. A 14FR coude catheter was inserted, urine return was noted 35ml, urine was yellow in color.  36ml of reconstituted BCG was instilled into the bladder. The catheter was then removed. Patient tolerated well, no complications were noted  Performed by: Debroah Loop, PA-C and Bradly Bienenstock, CMA  Follow up/ Additional notes: 4 days for BCG #3 of 6

## 2020-05-17 LAB — URINALYSIS, COMPLETE
Bilirubin, UA: NEGATIVE
Glucose, UA: NEGATIVE
Ketones, UA: NEGATIVE
Leukocytes,UA: NEGATIVE
Nitrite, UA: NEGATIVE
Protein,UA: NEGATIVE
Specific Gravity, UA: 1.02 (ref 1.005–1.030)
Urobilinogen, Ur: 0.2 mg/dL (ref 0.2–1.0)
pH, UA: 5.5 (ref 5.0–7.5)

## 2020-05-17 LAB — MICROSCOPIC EXAMINATION: Bacteria, UA: NONE SEEN

## 2020-05-20 ENCOUNTER — Other Ambulatory Visit: Payer: Self-pay

## 2020-05-20 ENCOUNTER — Ambulatory Visit (INDEPENDENT_AMBULATORY_CARE_PROVIDER_SITE_OTHER): Payer: PPO | Admitting: *Deleted

## 2020-05-20 DIAGNOSIS — D090 Carcinoma in situ of bladder: Secondary | ICD-10-CM

## 2020-05-20 LAB — URINALYSIS, COMPLETE
Bilirubin, UA: NEGATIVE
Glucose, UA: NEGATIVE
Ketones, UA: NEGATIVE
Leukocytes,UA: NEGATIVE
Nitrite, UA: NEGATIVE
Protein,UA: NEGATIVE
RBC, UA: NEGATIVE
Specific Gravity, UA: 1.02 (ref 1.005–1.030)
Urobilinogen, Ur: 0.2 mg/dL (ref 0.2–1.0)
pH, UA: 5.5 (ref 5.0–7.5)

## 2020-05-20 LAB — MICROSCOPIC EXAMINATION: Bacteria, UA: NONE SEEN

## 2020-05-20 MED ORDER — BCG LIVE 50 MG IS SUSR
3.2400 mL | Freq: Once | INTRAVESICAL | Status: AC
  Administered 2020-05-20: 81 mg via INTRAVESICAL

## 2020-05-20 NOTE — Progress Notes (Addendum)
BCG Bladder Instillation  BCG # 3 of 6  Due to Bladder Cancer patient is present today for a BCG treatment. Patient was cleaned and prepped in a sterile fashion with betadine. A 14FR catheter was inserted, urine return was noted 34ml, urine was yellow in color.  38ml of reconstituted BCG was instilled into the bladder. The catheter was then removed. Patient tolerated well, no complications were noted  Performed by: Marcelles Clinard,CMA and Edwin Dada, CMA  Follow up/ Additional notes: As scheduled

## 2020-05-27 ENCOUNTER — Other Ambulatory Visit: Payer: Self-pay

## 2020-05-27 ENCOUNTER — Ambulatory Visit (INDEPENDENT_AMBULATORY_CARE_PROVIDER_SITE_OTHER): Payer: PPO | Admitting: Physician Assistant

## 2020-05-27 DIAGNOSIS — D090 Carcinoma in situ of bladder: Secondary | ICD-10-CM

## 2020-05-27 LAB — URINALYSIS, COMPLETE
Bilirubin, UA: NEGATIVE
Glucose, UA: NEGATIVE
Ketones, UA: NEGATIVE
Leukocytes,UA: NEGATIVE
Nitrite, UA: NEGATIVE
Protein,UA: NEGATIVE
RBC, UA: NEGATIVE
Specific Gravity, UA: 1.025 (ref 1.005–1.030)
Urobilinogen, Ur: 0.2 mg/dL (ref 0.2–1.0)
pH, UA: 5 (ref 5.0–7.5)

## 2020-05-27 LAB — MICROSCOPIC EXAMINATION
Bacteria, UA: NONE SEEN
RBC, Urine: NONE SEEN /hpf (ref 0–2)

## 2020-05-27 MED ORDER — BCG LIVE 50 MG IS SUSR
3.2400 mL | Freq: Once | INTRAVESICAL | Status: AC
  Administered 2020-05-27: 81 mg via INTRAVESICAL

## 2020-05-27 NOTE — Progress Notes (Signed)
BCG Bladder Instillation  BCG # 4 of 6  Due to Bladder Cancer patient is present today for a BCG treatment. Patient was cleaned and prepped in a sterile fashion with betadine. A 14FR catheter was inserted, urine return was noted 154ml, urine was yellow in color.  13ml of reconstituted BCG was instilled into the bladder. The catheter was then removed. Patient tolerated well, no complications were noted  Performed by: Debroah Loop, PA-C and Verlene Mayer, CMA  Additional notes: Patient reports new nocturia x3-4 since starting BCG. No BLE edema, occasional snoring per wife but has never had a sleep study. He has Flomax but takes it occasionally. Counseled him to start Flomax daily and will recheck symptoms next week. If persistent, patient may benefit from sleep study to evaluate for OSA in the future.  Follow up/ Additional notes: 1 week for BCG #5 of 6

## 2020-06-03 ENCOUNTER — Other Ambulatory Visit: Payer: Self-pay

## 2020-06-03 ENCOUNTER — Ambulatory Visit (INDEPENDENT_AMBULATORY_CARE_PROVIDER_SITE_OTHER): Payer: PPO | Admitting: Physician Assistant

## 2020-06-03 DIAGNOSIS — D090 Carcinoma in situ of bladder: Secondary | ICD-10-CM | POA: Diagnosis not present

## 2020-06-03 LAB — MICROSCOPIC EXAMINATION
Bacteria, UA: NONE SEEN
RBC, Urine: NONE SEEN /hpf (ref 0–2)

## 2020-06-03 LAB — URINALYSIS, COMPLETE
Bilirubin, UA: NEGATIVE
Glucose, UA: NEGATIVE
Ketones, UA: NEGATIVE
Leukocytes,UA: NEGATIVE
Nitrite, UA: NEGATIVE
Protein,UA: NEGATIVE
RBC, UA: NEGATIVE
Specific Gravity, UA: 1.015 (ref 1.005–1.030)
Urobilinogen, Ur: 0.2 mg/dL (ref 0.2–1.0)
pH, UA: 7 (ref 5.0–7.5)

## 2020-06-03 MED ORDER — BCG LIVE 50 MG IS SUSR
3.2400 mL | Freq: Once | INTRAVESICAL | Status: AC
  Administered 2020-06-03: 81 mg via INTRAVESICAL

## 2020-06-03 NOTE — Patient Instructions (Signed)

## 2020-06-03 NOTE — Progress Notes (Signed)
BCG Bladder Instillation  BCG # 5 of 6  Due to Bladder Cancer patient is present today for a BCG treatment. Patient was cleaned and prepped in a sterile fashion with betadine. A 14FR coude catheter was inserted, urine return was noted 30ml, urine was yellow in color.  72ml of reconstituted BCG was instilled into the bladder. The catheter was then removed. Patient tolerated well, no complications were noted  Performed by: Debroah Loop, PA-C and Bradly Bienenstock, CMA  Follow up/ Additional notes: 1 week for BCG #6 of 6

## 2020-06-09 DIAGNOSIS — K627 Radiation proctitis: Secondary | ICD-10-CM | POA: Diagnosis not present

## 2020-06-09 DIAGNOSIS — K625 Hemorrhage of anus and rectum: Secondary | ICD-10-CM | POA: Diagnosis not present

## 2020-06-13 ENCOUNTER — Ambulatory Visit (INDEPENDENT_AMBULATORY_CARE_PROVIDER_SITE_OTHER): Payer: PPO | Admitting: Physician Assistant

## 2020-06-13 ENCOUNTER — Other Ambulatory Visit: Payer: Self-pay

## 2020-06-13 DIAGNOSIS — D090 Carcinoma in situ of bladder: Secondary | ICD-10-CM

## 2020-06-13 MED ORDER — BCG LIVE 50 MG IS SUSR
3.2400 mL | Freq: Once | INTRAVESICAL | Status: AC
  Administered 2020-06-13: 11:00:00 81 mg via INTRAVESICAL

## 2020-06-13 NOTE — Progress Notes (Signed)
BCG Bladder Instillation  BCG # 6 of 6  Due to Bladder Cancer patient is present today for a BCG treatment. Patient was cleaned and prepped in a sterile fashion with betadine. A 14FR catheter was inserted, urine return was noted , urine was yellow in color.  23ml of reconstituted BCG was instilled into the bladder. The catheter was then removed. Patient tolerated well, no complications were noted  Performed by: Carman Ching, PA-C and Franchot Erichsen, CMA  Follow up/ Additional notes: 3 months for cystoscopy with Dr. Lonna Cobb

## 2020-06-14 LAB — URINALYSIS, COMPLETE
Bilirubin, UA: NEGATIVE
Glucose, UA: NEGATIVE
Ketones, UA: NEGATIVE
Leukocytes,UA: NEGATIVE
Nitrite, UA: NEGATIVE
Protein,UA: NEGATIVE
RBC, UA: NEGATIVE
Specific Gravity, UA: 1.015 (ref 1.005–1.030)
Urobilinogen, Ur: 0.2 mg/dL (ref 0.2–1.0)
pH, UA: 6.5 (ref 5.0–7.5)

## 2020-06-14 LAB — MICROSCOPIC EXAMINATION
Bacteria, UA: NONE SEEN
Epithelial Cells (non renal): NONE SEEN /hpf (ref 0–10)
RBC, Urine: NONE SEEN /hpf (ref 0–2)

## 2020-07-08 ENCOUNTER — Other Ambulatory Visit: Admission: RE | Admit: 2020-07-08 | Payer: PPO | Source: Ambulatory Visit

## 2020-07-12 ENCOUNTER — Ambulatory Visit: Admission: RE | Admit: 2020-07-12 | Payer: PPO | Source: Home / Self Care

## 2020-07-12 ENCOUNTER — Encounter: Admission: RE | Payer: Self-pay | Source: Home / Self Care

## 2020-07-12 SURGERY — SIGMOIDOSCOPY, FLEXIBLE
Anesthesia: General

## 2020-08-09 ENCOUNTER — Ambulatory Visit: Payer: PPO

## 2020-08-10 ENCOUNTER — Encounter: Payer: Self-pay | Admitting: Urology

## 2020-08-12 ENCOUNTER — Telehealth: Payer: Self-pay

## 2020-08-12 NOTE — Telephone Encounter (Signed)
No PA required for [place] via buy and bill. Case ID: [id number]

## 2020-08-17 ENCOUNTER — Inpatient Hospital Stay: Payer: PPO | Attending: Radiation Oncology

## 2020-08-17 DIAGNOSIS — C61 Malignant neoplasm of prostate: Secondary | ICD-10-CM | POA: Insufficient documentation

## 2020-08-23 DIAGNOSIS — I2510 Atherosclerotic heart disease of native coronary artery without angina pectoris: Secondary | ICD-10-CM | POA: Diagnosis not present

## 2020-08-23 DIAGNOSIS — I10 Essential (primary) hypertension: Secondary | ICD-10-CM | POA: Diagnosis not present

## 2020-08-23 DIAGNOSIS — R790 Abnormal level of blood mineral: Secondary | ICD-10-CM | POA: Diagnosis not present

## 2020-08-23 DIAGNOSIS — F411 Generalized anxiety disorder: Secondary | ICD-10-CM | POA: Diagnosis not present

## 2020-08-23 DIAGNOSIS — D649 Anemia, unspecified: Secondary | ICD-10-CM | POA: Diagnosis not present

## 2020-08-23 DIAGNOSIS — E782 Mixed hyperlipidemia: Secondary | ICD-10-CM | POA: Diagnosis not present

## 2020-08-23 DIAGNOSIS — K5520 Angiodysplasia of colon without hemorrhage: Secondary | ICD-10-CM | POA: Diagnosis not present

## 2020-08-23 DIAGNOSIS — E538 Deficiency of other specified B group vitamins: Secondary | ICD-10-CM | POA: Diagnosis not present

## 2020-08-24 ENCOUNTER — Ambulatory Visit: Payer: PPO | Admitting: Radiation Oncology

## 2020-08-30 DIAGNOSIS — D500 Iron deficiency anemia secondary to blood loss (chronic): Secondary | ICD-10-CM | POA: Diagnosis not present

## 2020-08-30 DIAGNOSIS — N1831 Chronic kidney disease, stage 3a: Secondary | ICD-10-CM | POA: Diagnosis not present

## 2020-08-30 DIAGNOSIS — D090 Carcinoma in situ of bladder: Secondary | ICD-10-CM | POA: Diagnosis not present

## 2020-08-30 DIAGNOSIS — J42 Unspecified chronic bronchitis: Secondary | ICD-10-CM | POA: Diagnosis not present

## 2020-08-30 DIAGNOSIS — F419 Anxiety disorder, unspecified: Secondary | ICD-10-CM | POA: Diagnosis not present

## 2020-08-30 DIAGNOSIS — Z0000 Encounter for general adult medical examination without abnormal findings: Secondary | ICD-10-CM | POA: Diagnosis not present

## 2020-08-30 DIAGNOSIS — E782 Mixed hyperlipidemia: Secondary | ICD-10-CM | POA: Diagnosis not present

## 2020-08-30 DIAGNOSIS — R7309 Other abnormal glucose: Secondary | ICD-10-CM | POA: Diagnosis not present

## 2020-08-30 DIAGNOSIS — I6523 Occlusion and stenosis of bilateral carotid arteries: Secondary | ICD-10-CM | POA: Diagnosis not present

## 2020-08-30 DIAGNOSIS — J209 Acute bronchitis, unspecified: Secondary | ICD-10-CM | POA: Diagnosis not present

## 2020-09-02 ENCOUNTER — Other Ambulatory Visit: Payer: PPO | Admitting: Urology

## 2020-09-05 DIAGNOSIS — K625 Hemorrhage of anus and rectum: Secondary | ICD-10-CM | POA: Diagnosis not present

## 2020-09-06 DIAGNOSIS — D500 Iron deficiency anemia secondary to blood loss (chronic): Secondary | ICD-10-CM | POA: Diagnosis not present

## 2020-09-06 DIAGNOSIS — K625 Hemorrhage of anus and rectum: Secondary | ICD-10-CM | POA: Diagnosis not present

## 2020-09-07 ENCOUNTER — Other Ambulatory Visit
Admission: RE | Admit: 2020-09-07 | Discharge: 2020-09-07 | Disposition: A | Discharge status: Home / Self Care | Payer: PPO | Source: Ambulatory Visit | Attending: Gastroenterology | Admitting: Gastroenterology

## 2020-09-07 ENCOUNTER — Inpatient Hospital Stay: Payer: PPO

## 2020-09-07 DIAGNOSIS — Z01812 Encounter for preprocedural laboratory examination: Secondary | ICD-10-CM | POA: Diagnosis not present

## 2020-09-07 DIAGNOSIS — Z20822 Contact with and (suspected) exposure to covid-19: Secondary | ICD-10-CM | POA: Insufficient documentation

## 2020-09-07 LAB — SARS CORONAVIRUS 2 (TAT 6-24 HRS): SARS Coronavirus 2: NEGATIVE

## 2020-09-08 ENCOUNTER — Encounter: Payer: Self-pay | Admitting: *Deleted

## 2020-09-09 ENCOUNTER — Other Ambulatory Visit: Payer: Self-pay

## 2020-09-09 ENCOUNTER — Encounter: Payer: Self-pay | Admitting: *Deleted

## 2020-09-09 ENCOUNTER — Ambulatory Visit: Payer: PPO | Admitting: Registered Nurse

## 2020-09-09 ENCOUNTER — Ambulatory Visit
Admission: RE | Admit: 2020-09-09 | Discharge: 2020-09-09 | Disposition: A | Discharge status: Home / Self Care | Payer: PPO | Attending: Gastroenterology | Admitting: Gastroenterology

## 2020-09-09 ENCOUNTER — Encounter
Admission: RE | Disposition: A | Discharge status: Home / Self Care | Payer: Self-pay | Source: Home / Self Care | Attending: Gastroenterology

## 2020-09-09 DIAGNOSIS — D509 Iron deficiency anemia, unspecified: Secondary | ICD-10-CM | POA: Diagnosis not present

## 2020-09-09 DIAGNOSIS — K625 Hemorrhage of anus and rectum: Secondary | ICD-10-CM | POA: Diagnosis not present

## 2020-09-09 DIAGNOSIS — K627 Radiation proctitis: Secondary | ICD-10-CM | POA: Diagnosis not present

## 2020-09-09 DIAGNOSIS — K5730 Diverticulosis of large intestine without perforation or abscess without bleeding: Secondary | ICD-10-CM | POA: Diagnosis not present

## 2020-09-09 DIAGNOSIS — D122 Benign neoplasm of ascending colon: Secondary | ICD-10-CM | POA: Diagnosis not present

## 2020-09-09 DIAGNOSIS — K5521 Angiodysplasia of colon with hemorrhage: Secondary | ICD-10-CM | POA: Insufficient documentation

## 2020-09-09 DIAGNOSIS — Z7982 Long term (current) use of aspirin: Secondary | ICD-10-CM | POA: Diagnosis not present

## 2020-09-09 DIAGNOSIS — D125 Benign neoplasm of sigmoid colon: Secondary | ICD-10-CM | POA: Insufficient documentation

## 2020-09-09 DIAGNOSIS — E785 Hyperlipidemia, unspecified: Secondary | ICD-10-CM | POA: Insufficient documentation

## 2020-09-09 DIAGNOSIS — I10 Essential (primary) hypertension: Secondary | ICD-10-CM | POA: Insufficient documentation

## 2020-09-09 DIAGNOSIS — K5790 Diverticulosis of intestine, part unspecified, without perforation or abscess without bleeding: Secondary | ICD-10-CM | POA: Diagnosis not present

## 2020-09-09 DIAGNOSIS — K635 Polyp of colon: Secondary | ICD-10-CM | POA: Diagnosis not present

## 2020-09-09 DIAGNOSIS — K5520 Angiodysplasia of colon without hemorrhage: Secondary | ICD-10-CM | POA: Diagnosis not present

## 2020-09-09 DIAGNOSIS — Z79899 Other long term (current) drug therapy: Secondary | ICD-10-CM | POA: Diagnosis not present

## 2020-09-09 DIAGNOSIS — J449 Chronic obstructive pulmonary disease, unspecified: Secondary | ICD-10-CM | POA: Diagnosis not present

## 2020-09-09 DIAGNOSIS — H9190 Unspecified hearing loss, unspecified ear: Secondary | ICD-10-CM | POA: Insufficient documentation

## 2020-09-09 HISTORY — PX: COLONOSCOPY WITH PROPOFOL: SHX5780

## 2020-09-09 SURGERY — COLONOSCOPY WITH PROPOFOL
Anesthesia: General

## 2020-09-09 MED ORDER — SODIUM CHLORIDE 0.9 % IV SOLN
INTRAVENOUS | Status: DC
  Administered 2020-09-09: 1000 mL via INTRAVENOUS

## 2020-09-09 MED ORDER — PROPOFOL 10 MG/ML IV BOLUS
INTRAVENOUS | Status: DC | PRN
  Administered 2020-09-09: 10 mg via INTRAVENOUS
  Administered 2020-09-09: 40 mg via INTRAVENOUS
  Administered 2020-09-09: 10 mg via INTRAVENOUS

## 2020-09-09 MED ORDER — PROPOFOL 500 MG/50ML IV EMUL
INTRAVENOUS | Status: DC | PRN
  Administered 2020-09-09: 120 ug/kg/min via INTRAVENOUS

## 2020-09-09 MED ORDER — LIDOCAINE HCL (CARDIAC) PF 100 MG/5ML IV SOSY
PREFILLED_SYRINGE | INTRAVENOUS | Status: DC | PRN
  Administered 2020-09-09: 100 mg via INTRAVENOUS

## 2020-09-09 NOTE — Op Note (Signed)
[facility] Gastroenterology Patient Name: Ruben Green Procedure Date: 09/09/2020 12:37 PM MRN: [id number] Account #: [id number] Date of Birth: 1934-02-05 Admit Type: Outpatient Age: 87 Room: [facility] ENDO ROOM 3 Gender: Male Note Status: Finalized Procedure:             Colonoscopy Indications:           Rectal bleeding, Iron deficiency anemia Providers:             Andrey Farmer MD, MD Referring MD:          Tracie Harrier, MD (Referring MD) Medicines:             Monitored Anesthesia Care Complications:         No immediate complications. Estimated blood loss:                         Minimal. Procedure:             Pre-Anesthesia Assessment:                        - Prior to the procedure, a History and Physical was                         performed, and patient medications and allergies were                         reviewed. The patient is competent. The risks and                         benefits of the procedure and the sedation options and                         risks were discussed with the patient. All questions                         were answered and informed consent was obtained.                         Patient identification and proposed procedure were                         verified by the physician, the nurse, the anesthetist                         and the technician in the endoscopy suite. Mental                         Status Examination: alert and oriented. Airway                         Examination: normal oropharyngeal airway and neck                         mobility. Respiratory Examination: clear to                         auscultation. CV Examination: normal. Prophylactic  Antibiotics: The patient does not require prophylactic                         antibiotics. Prior Anticoagulants: The patient has                         taken no previous anticoagulant or antiplatelet                         agents. ASA  Grade Assessment: II - A patient with mild                         systemic disease. After reviewing the risks and                         benefits, the patient was deemed in satisfactory                         condition to undergo the procedure. The anesthesia                         plan was to use monitored anesthesia care (MAC).                         Immediately prior to administration of medications,                         the patient was re-assessed for adequacy to receive                         sedatives. The heart rate, respiratory rate, oxygen                         saturations, blood pressure, adequacy of pulmonary                         ventilation, and response to care were monitored                         throughout the procedure. The physical status of the                         patient was re-assessed after the procedure.                        After obtaining informed consent, the colonoscope was                         passed under direct vision. Throughout the procedure,                         the patient's blood pressure, pulse, and oxygen                         saturations were monitored continuously. The                         Colonoscope was introduced through the anus and  advanced to the the cecum, identified by appendiceal                         orifice and ileocecal valve. The colonoscopy was                         performed without difficulty. The patient tolerated                         the procedure well. The quality of the bowel                         preparation was fair. Findings:      The perianal and digital rectal examinations were normal.      A 2 mm polyp was found in the ascending colon. The polyp was sessile.       The polyp was removed with a jumbo cold forceps. Resection and retrieval       were complete. Estimated blood loss was minimal.      A 3 mm polyp was found in the sigmoid colon. The polyp was        semi-pedunculated. The polyp was removed with a cold snare. Resection       and retrieval were complete. Estimated blood loss was minimal.      Many small and large-mouthed diverticula were found in the sigmoid       colon, descending colon, transverse colon, hepatic flexure and ascending       colon.      Multiple medium-sized patchy angioectasias with stigmata of recent       bleeding were found in the rectum. Coagulation for bleeding prevention       using argon plasma was successful. Estimated blood loss was minimal. Impression:            - Preparation of the colon was fair.                        - One 2 mm polyp in the ascending colon, removed with                         a jumbo cold forceps. Resected and retrieved.                        - One 3 mm polyp in the sigmoid colon, removed with a                         cold snare. Resected and retrieved.                        - Diverticulosis in the sigmoid colon, in the                         descending colon, in the transverse colon, at the                         hepatic flexure and in the ascending colon.                        - Multiple recently bleeding colonic angioectasias.  Treated with argon plasma coagulation (APC). Recommendation:        - Discharge patient to home.                        - Resume previous diet.                        - Continue present medications.                        - Await pathology results.                        - Repeat colonoscopy is not recommended due to current                         age (61 years or older) for surveillance.                        - Return to GI office as previously scheduled. Procedure Code(s):     --- Professional ---                        [phone], 5, Colonoscopy, flexible; with control of                         bleeding, any method                        45385, Colonoscopy, flexible; with removal of                         tumor(s), polyp(s),  or other lesion(s) by snare                         technique                        45380, 66, Colonoscopy, flexible; with biopsy, single                         or multiple Diagnosis Code(s):     --- Professional ---                        K63.5, Polyp of colon                        K55.21, Angiodysplasia of colon with hemorrhage                        K62.5, Hemorrhage of anus and rectum                        D50.9, Iron deficiency anemia, unspecified                        K57.30, Diverticulosis of large intestine without                         perforation or abscess without bleeding CPT copyright 2019 American Medical Association. All rights reserved. The codes documented in this report are preliminary and  upon coder review may  be revised to meet current compliance requirements. Andrey Farmer MD, MD 09/09/2020 1:06:48 PM Number of Addenda: 0 Note Initiated On: 09/09/2020 12:37 PM Scope Withdrawal Time: 0 hours 12 minutes 29 seconds  Total Procedure Duration: 0 hours 17 minutes 36 seconds  Estimated Blood Loss:  Estimated blood loss was minimal.      [facility]

## 2020-09-09 NOTE — Transfer of Care (Signed)
Immediate Anesthesia Transfer of Care Note  Patient: Ruben Green  Procedure(s) Performed: COLONOSCOPY WITH PROPOFOL (N/A )  Patient Location: Endoscopy Unit  Anesthesia Type:General  Level of Consciousness: drowsy  Airway & Oxygen Therapy: Patient Spontanous Breathing  Post-op Assessment: Report given to RN and Post -op Vital signs reviewed and stable  Post vital signs: Reviewed and stable  Last Vitals: see Epic record Vitals Value Taken Time  BP    Temp    Pulse    Resp    SpO2      Last Pain:  Vitals:   09/09/20 1212  TempSrc: Temporal  PainSc: 0-No pain      Patients Stated Pain Goal: 0 (70/26/37 8588)  Complications: No complications documented.

## 2020-09-09 NOTE — H&P (Signed)
Outpatient short stay form Pre-procedure 09/09/2020 12:26 PM Raylene Miyamoto MD, MPH  Primary Physician: Dr. Ginette Pitman  Reason for visit:  Rectal bleeding  History of present illness:   87 y/o gentleman with history of radiation proctitis here for colonoscopy. Had colonoscopy in 2020 with radiation proctitis treated with apc. Now with IDA. No blood thinners. Has not tried carafate enemas yet. Two small polyps on last colonoscopy.   No current facility-administered medications for this encounter.  Medications Prior to Admission  Medication Sig Dispense Refill Last Dose  . albuterol (PROVENTIL HFA;VENTOLIN HFA) 108 (90 Base) MCG/ACT inhaler Inhale 2 puffs into the lungs every 6 (six) hours as needed for wheezing or shortness of breath. 1 Inhaler 2 Past Week at Unknown time  . albuterol-ipratropium (COMBIVENT) 18-103 MCG/ACT inhaler Inhale 1-2 puffs into the lungs every 4 (four) hours.    Past Week at Unknown time  . amLODipine (NORVASC) 10 MG tablet Take 10 mg by mouth daily.    Past Week at 0900am  . aspirin EC 81 MG tablet Take 81 mg by mouth daily.   Past Week at Unknown time  . atorvastatin (LIPITOR) 40 MG tablet Take 40 mg by mouth at bedtime.    Past Week at Unknown time  . finasteride (PROSCAR) 5 MG tablet Take 1 tablet (5 mg total) by mouth daily. 90 tablet 3 Past Week at Unknown time  . Fluticasone-Umeclidin-Vilant (TRELEGY ELLIPTA) 100-62.5-25 MCG/INH AEPB Inhale 1 puff into the lungs daily.    Past Week at Unknown time  . furosemide (LASIX) 20 MG tablet Take 20 mg by mouth daily.    Past Week at Unknown time  . gabapentin (NEURONTIN) 100 MG capsule Take 100 mg by mouth at bedtime.    Past Week at Unknown time  . hydrALAZINE (APRESOLINE) 25 MG tablet Take 25 mg by mouth 2 (two) times daily.    09/09/2020 at 0900am  . iron polysaccharides (NIFEREX) 150 MG capsule Take by mouth.   Past Week at Unknown time  . LORazepam (ATIVAN) 0.5 MG tablet Take 0.5 mg by mouth every 8 (eight) hours.     Past Week at Unknown time  . losartan (COZAAR) 100 MG tablet Take 100 mg by mouth daily.    Past Week at Unknown time  . phenazopyridine (PYRIDIUM) 200 MG tablet Take 1 tablet (200 mg total) by mouth 3 (three) times daily as needed (burning with urination). 15 tablet 0 Past Week at Unknown time  . tamsulosin (FLOMAX) 0.4 MG CAPS capsule Take 1 capsule (0.4 mg total) by mouth daily. 30 capsule 11 Past Week at Unknown time  . traMADol (ULTRAM) 50 MG tablet Take 0.5-1 tablets (25-50 mg total) by mouth every 6 (six) hours as needed for moderate pain. 15 tablet 0 Past Week at Unknown time  . TRELEGY ELLIPTA 100-62.5-25 MCG/INH AEPB Inhale 1 puff into the lungs daily.    Past Week at Unknown time     No Known Allergies   Past Medical History:  Diagnosis Date  . Anemia   . Anxiety   . BPH with obstruction/lower urinary tract symptoms   . Chronic bronchitis ([place])   . COPD (chronic obstructive pulmonary disease) ([place])   . Elevated PSA   . Environmental and seasonal allergies   . HLD (hyperlipidemia)   . HOH (hard of hearing)   . HTN (hypertension)   . Incomplete bladder emptying   . Macular degeneration     Review of systems:  Otherwise negative.  Physical Exam  Gen: Alert, oriented. Appears stated age.  HEENT: PERRLA. Lungs: No respiratory distress CV: RRR Abd: soft, benign, no masses Ext: No edema    Planned procedures: Proceed with colonoscopy. The patient understands the nature of the planned procedure, indications, risks, alternatives and potential complications including but not limited to bleeding, infection, perforation, damage to internal organs and possible oversedation/side effects from anesthesia. The patient agrees and gives consent to proceed.  Please refer to procedure notes for findings, recommendations and patient disposition/instructions.     Raylene Miyamoto MD, MPH Gastroenterology 09/09/2020  12:26 PM

## 2020-09-09 NOTE — Anesthesia Preprocedure Evaluation (Signed)
Anesthesia Evaluation  Patient identified by MRN, date of birth, ID band Patient awake    Reviewed: Allergy & Precautions, NPO status , Patient's Chart, lab work & pertinent test results  History of Anesthesia Complications Negative for: history of anesthetic complications  Airway Mallampati: III       Dental  (+) Dental Advidsory Given, Caps, Poor Dentition   Pulmonary neg shortness of breath, neg sleep apnea, COPD,  COPD inhaler, neg recent URI, Not current smoker, former smoker,           Cardiovascular hypertension, Pt. on medications (-) angina+ CAD  (-) Past MI, (-) Cardiac Stents and (-) CHF (-) dysrhythmias (-) Valvular Problems/Murmurs     Neuro/Psych neg Seizures PSYCHIATRIC DISORDERS Anxiety    GI/Hepatic Neg liver ROS, neg GERD  ,  Endo/Other  neg diabetes  Renal/GU negative Renal ROS     Musculoskeletal   Abdominal   Peds  Hematology  (+) Blood dyscrasia, anemia ,   Anesthesia Other Findings Past Medical History: No date: Anemia No date: Anxiety No date: BPH with obstruction/lower urinary tract symptoms No date: Chronic bronchitis ([facility]) No date: COPD (chronic obstructive pulmonary disease) ([facility]) No date: Elevated PSA No date: Environmental and seasonal allergies No date: HLD (hyperlipidemia) No date: HOH (hard of hearing) No date: HTN (hypertension) No date: Incomplete bladder emptying No date: Macular degeneration   Reproductive/Obstetrics negative OB ROS                             Anesthesia Physical  Anesthesia Plan  ASA: III  Anesthesia Plan: General   Post-op Pain Management:    Induction: Intravenous  PONV Risk Score and Plan: 2 and TIVA and Propofol infusion  Airway Management Planned: Natural Airway and Nasal Cannula  Additional Equipment:   Intra-op Plan:   Post-operative Plan:   Informed Consent: I have reviewed the patients History and  Physical, chart, labs and discussed the procedure including the risks, benefits and alternatives for the proposed anesthesia with the patient or authorized representative who has indicated his/her understanding and acceptance.       Plan Discussed with:   Anesthesia Plan Comments:         Anesthesia Quick Evaluation

## 2020-09-10 NOTE — Anesthesia Postprocedure Evaluation (Signed)
Anesthesia Post Note  Patient: Ruben Green  Procedure(s) Performed: COLONOSCOPY WITH PROPOFOL (N/A )  Patient location during evaluation: Endoscopy Anesthesia Type: General Level of consciousness: awake and alert Pain management: pain level controlled Vital Signs Assessment: post-procedure vital signs reviewed and stable Respiratory status: spontaneous breathing, nonlabored ventilation, respiratory function stable and patient connected to nasal cannula oxygen Cardiovascular status: blood pressure returned to baseline and stable Postop Assessment: no apparent nausea or vomiting Anesthetic complications: no   No complications documented.   Last Vitals:  Vitals:   09/09/20 1324 09/09/20 1334  BP: (!) 164/73 (!) 158/87  Pulse: 80 84  Resp: 18 18  Temp:    SpO2: 95% 94%    Last Pain:  Vitals:   09/09/20 1334  TempSrc:   PainSc: 0-No pain                 Martha Clan

## 2020-09-11 ENCOUNTER — Encounter: Payer: Self-pay | Admitting: Gastroenterology

## 2020-09-12 LAB — SURGICAL PATHOLOGY

## 2020-09-13 ENCOUNTER — Inpatient Hospital Stay: Payer: PPO

## 2020-09-13 DIAGNOSIS — C61 Malignant neoplasm of prostate: Secondary | ICD-10-CM

## 2020-09-13 LAB — PSA: Prostatic Specific Antigen: <0.01 ng/mL (ref 0.00–4.00)

## 2020-09-14 ENCOUNTER — Ambulatory Visit: Payer: PPO | Admitting: Radiation Oncology

## 2020-09-16 ENCOUNTER — Ambulatory Visit: Payer: PPO | Admitting: Urology

## 2020-09-16 ENCOUNTER — Other Ambulatory Visit: Payer: Self-pay

## 2020-09-16 ENCOUNTER — Encounter: Payer: Self-pay | Admitting: Urology

## 2020-09-16 VITALS — BP 144/70 | HR 91 | Ht 68.0 in | Wt 159.0 lb

## 2020-09-16 DIAGNOSIS — D090 Carcinoma in situ of bladder: Secondary | ICD-10-CM | POA: Diagnosis not present

## 2020-09-16 LAB — URINALYSIS, COMPLETE
Bilirubin, UA: NEGATIVE
Glucose, UA: NEGATIVE
Ketones, UA: NEGATIVE
Leukocytes,UA: NEGATIVE
Nitrite, UA: NEGATIVE
Protein,UA: NEGATIVE
RBC, UA: NEGATIVE
Specific Gravity, UA: 1.02 (ref 1.005–1.030)
Urobilinogen, Ur: 0.2 mg/dL (ref 0.2–1.0)
pH, UA: 5.5 (ref 5.0–7.5)

## 2020-09-16 LAB — MICROSCOPIC EXAMINATION
Bacteria, UA: NONE SEEN
Epithelial Cells (non renal): NONE SEEN /hpf (ref 0–10)
WBC, UA: NONE SEEN /hpf (ref 0–5)

## 2020-09-16 NOTE — Progress Notes (Signed)
   09/16/20  CC:  Chief Complaint  Patient presents with  . Cysto    Indications:  Carcinoma in situ bladder biopsies 02/23/2020  Completed 6 week course of BCG induction 06/13/2020   HPI:  Blood pressure (!) 144/70, pulse 91, height 5\' 8"  (1.727 m), weight 159 lb (72.1 kg). NED. A&Ox3.   No respiratory distress   Abd soft, NT, ND Normal phallus with bilateral descended testicles  Cystoscopy Procedure Note  Patient identification was confirmed, informed consent was obtained, and patient was prepped using Betadine solution.  Lidocaine jelly was administered per urethral meatus.     Pre-Procedure: - Inspection reveals a normal caliber urethral meatus.  Procedure: The flexible cystoscope was introduced without difficulty - No urethral strictures/lesions are present. - Moderate lateral lobe enlargement prostate  - Moderate elevation bladder neck - Bilateral ureteral orifices identified - Bladder mucosa  reveals no ulcers, tumors, or lesions - Left diverticulum without erythema or tumor - No bladder stones -Moderate trabeculation  Retroflexion shows small intravesical median lobe   Post-Procedure: - Patient tolerated the procedure well  Assessment/ Plan:  No evidence recurrent tumor or mucosal abnormalities  Discussed maintenance BCG and he would be due for his initial 3-week course  They will think this over and call back if desired schedule  Schedule surveillance cystoscopy 3 months   Abbie Sons, MD

## 2020-09-27 ENCOUNTER — Ambulatory Visit
Admission: RE | Admit: 2020-09-27 | Discharge: 2020-09-27 | Disposition: A | Discharge status: Home / Self Care | Payer: PPO | Source: Ambulatory Visit | Attending: Radiation Oncology | Admitting: Radiation Oncology

## 2020-09-27 ENCOUNTER — Encounter: Payer: Self-pay | Admitting: Radiation Oncology

## 2020-09-27 VITALS — BP 125/72 | HR 76 | Temp 97.9°F | Wt 171.2 lb

## 2020-09-27 DIAGNOSIS — Z08 Encounter for follow-up examination after completed treatment for malignant neoplasm: Secondary | ICD-10-CM | POA: Diagnosis not present

## 2020-09-27 DIAGNOSIS — C61 Malignant neoplasm of prostate: Secondary | ICD-10-CM

## 2020-09-27 NOTE — Progress Notes (Signed)
Radiation Oncology Follow up Note  Name: Ruben Green   Date:   09/27/2020 MRN:  [id number] DOB: 30-Aug-1933    This 87 y.o. male presents to the clinic today for 3-year follow-up status post IMRT radiation therapy to his prostate and pelvic nodes for stage IIb adenocarcinoma prostate Gleason 8 (4+4) presenting with a PSA of 12.7.  Patient has completed a 6-week course of BCG for in situ bladder cancer.  REFERRING PROVIDER: Tracie Harrier, MD  HPI: Patient is a 87 year old male now at 3 years having completed IMRT radiation therapy to prostate and pelvic nodes for Gleason 8 adenocarcinoma presenting with a PSA of 12.7.  Seen today in routine follow-up he is doing well.  He continues to have some urinary frequency and urgency although he has been treated with BCG for in situ bladder cancer.  His PSA remains undetectable at 0.01.  He does have some problems with bleeding per rectum and had a colonoscopy last month showingmedium-sized patchy angioectasias which were coagulated with argon placed plasma.  He does also have many large and small mouth diverticula.  He is slightly anemic.  Patient has denied but is taking aspirin daily and when asked him to curtail that  COMPLICATIONS OF TREATMENT: none  FOLLOW UP COMPLIANCE: keeps appointments   PHYSICAL EXAM:  BP 125/72   Pulse 76   Temp 97.9 F (36.6 C) (Tympanic)   Wt 171 lb 3.2 oz (77.7 kg)   BMI 26.03 kg/m  Well-developed well-nourished patient in NAD. HEENT reveals PERLA, EOMI, discs not visualized.  Oral cavity is clear. No oral mucosal lesions are identified. Neck is clear without evidence of cervical or supraclavicular adenopathy. Lungs are clear to A&P. Cardiac examination is essentially unremarkable with regular rate and rhythm without murmur rub or thrill. Abdomen is benign with no organomegaly or masses noted. Motor sensory and DTR levels are equal and symmetric in the upper and lower extremities. Cranial nerves II through XII  are grossly intact. Proprioception is intact. No peripheral adenopathy or edema is identified. No motor or sensory levels are noted. Crude visual fields are within normal range.  RADIOLOGY RESULTS: CT cystogram was performed showing no evidence to explain hematuria.  PLAN: Present time patient is doing well excellent biochemical control of his prostate cancer implant asked the wife to discontinue his daily aspirin to see if that affects some of his bleeding.  He continues close follow-up care with urology I have asked to see him back in 1 year for follow-up.  I would like to take this opportunity to thank you for allowing me to participate in the care of your patient.Noreene Filbert, MD

## 2020-12-12 ENCOUNTER — Other Ambulatory Visit: Payer: Self-pay

## 2020-12-12 ENCOUNTER — Encounter: Payer: Self-pay | Admitting: Urology

## 2020-12-12 ENCOUNTER — Ambulatory Visit: Payer: PPO | Admitting: Urology

## 2020-12-12 VITALS — BP 131/74 | HR 79 | Ht 61.0 in | Wt 168.0 lb

## 2020-12-12 DIAGNOSIS — R310 Gross hematuria: Secondary | ICD-10-CM

## 2020-12-12 DIAGNOSIS — D090 Carcinoma in situ of bladder: Secondary | ICD-10-CM

## 2020-12-12 NOTE — Progress Notes (Signed)
   12/12/20  CC:  Chief Complaint  Patient presents with   Cysto   Indications: Carcinoma in situ bladder biopsies 02/23/2020 Completed 6 week course of BCG induction 06/13/2020  HPI:  NED. A&Ox3.   No respiratory distress   Abd soft, NT, ND Normal phallus with bilateral descended testicles  Cystoscopy Procedure Note  Patient identification was confirmed, informed consent was obtained, and patient was prepped using Betadine solution.  Lidocaine jelly was administered per urethral meatus.     Pre-Procedure: - Inspection reveals a normal caliber urethral meatus.  Procedure: The flexible cystoscope was introduced without difficulty - No urethral strictures/lesions are present. -  Moderate lateral lobe enlargement  prostate  - Elevated bladder neck, moderate - Bilateral ureteral orifices identified - Bladder mucosa  reveals no ulcers, tumors, or lesions - No bladder stones -Moderate trabeculation with cellules/diverticula  Retroflexion shows small intravesical prostatic tissue with hypervascularity   Post-Procedure: - Patient tolerated the procedure well  Assessment/ Plan: No bladder mucosal lesions on surveillance cystoscopy Follow-up surveillance cystoscopy 3 months Urine cytology sent   Abbie Sons, MD

## 2020-12-13 ENCOUNTER — Telehealth: Payer: Self-pay | Admitting: *Deleted

## 2020-12-13 LAB — URINALYSIS, COMPLETE
Bilirubin, UA: NEGATIVE
Glucose, UA: NEGATIVE
Ketones, UA: NEGATIVE
Leukocytes,UA: NEGATIVE
Nitrite, UA: NEGATIVE
Protein,UA: NEGATIVE
RBC, UA: NEGATIVE
Specific Gravity, UA: 1.015 (ref 1.005–1.030)
Urobilinogen, Ur: 0.2 mg/dL (ref 0.2–1.0)
pH, UA: 6 (ref 5.0–7.5)

## 2020-12-13 LAB — MICROSCOPIC EXAMINATION
Bacteria, UA: NONE SEEN
RBC, Urine: NONE SEEN /hpf (ref 0–2)

## 2020-12-13 LAB — CYTOLOGY - NON PAP

## 2020-12-13 NOTE — Telephone Encounter (Signed)
-----   Message from Abbie Sons, MD sent at 12/13/2020  3:51 PM EDT ----- No suspicious cells seen on urine cytology

## 2020-12-13 NOTE — Telephone Encounter (Signed)
Notified patient as instructed,.  

## 2020-12-28 ENCOUNTER — Other Ambulatory Visit: Payer: PPO | Admitting: Urology

## 2021-01-19 DIAGNOSIS — U071 COVID-19: Secondary | ICD-10-CM | POA: Diagnosis not present

## 2021-01-19 DIAGNOSIS — J209 Acute bronchitis, unspecified: Secondary | ICD-10-CM | POA: Diagnosis not present

## 2021-01-19 DIAGNOSIS — J42 Unspecified chronic bronchitis: Secondary | ICD-10-CM | POA: Diagnosis not present

## 2021-02-28 DIAGNOSIS — R7309 Other abnormal glucose: Secondary | ICD-10-CM | POA: Diagnosis not present

## 2021-02-28 DIAGNOSIS — J209 Acute bronchitis, unspecified: Secondary | ICD-10-CM | POA: Diagnosis not present

## 2021-02-28 DIAGNOSIS — F419 Anxiety disorder, unspecified: Secondary | ICD-10-CM | POA: Diagnosis not present

## 2021-02-28 DIAGNOSIS — N1831 Chronic kidney disease, stage 3a: Secondary | ICD-10-CM | POA: Diagnosis not present

## 2021-02-28 DIAGNOSIS — E782 Mixed hyperlipidemia: Secondary | ICD-10-CM | POA: Diagnosis not present

## 2021-02-28 DIAGNOSIS — I6523 Occlusion and stenosis of bilateral carotid arteries: Secondary | ICD-10-CM | POA: Diagnosis not present

## 2021-02-28 DIAGNOSIS — D090 Carcinoma in situ of bladder: Secondary | ICD-10-CM | POA: Diagnosis not present

## 2021-02-28 DIAGNOSIS — D500 Iron deficiency anemia secondary to blood loss (chronic): Secondary | ICD-10-CM | POA: Diagnosis not present

## 2021-03-08 DIAGNOSIS — D090 Carcinoma in situ of bladder: Secondary | ICD-10-CM | POA: Diagnosis not present

## 2021-03-08 DIAGNOSIS — I10 Essential (primary) hypertension: Secondary | ICD-10-CM | POA: Diagnosis not present

## 2021-03-08 DIAGNOSIS — E782 Mixed hyperlipidemia: Secondary | ICD-10-CM | POA: Diagnosis not present

## 2021-03-08 DIAGNOSIS — I714 Abdominal aortic aneurysm, without rupture: Secondary | ICD-10-CM | POA: Diagnosis not present

## 2021-03-08 DIAGNOSIS — E875 Hyperkalemia: Secondary | ICD-10-CM | POA: Diagnosis not present

## 2021-03-08 DIAGNOSIS — Z23 Encounter for immunization: Secondary | ICD-10-CM | POA: Diagnosis not present

## 2021-03-08 DIAGNOSIS — D649 Anemia, unspecified: Secondary | ICD-10-CM | POA: Diagnosis not present

## 2021-03-08 DIAGNOSIS — J42 Unspecified chronic bronchitis: Secondary | ICD-10-CM | POA: Diagnosis not present

## 2021-03-08 DIAGNOSIS — K4090 Unilateral inguinal hernia, without obstruction or gangrene, not specified as recurrent: Secondary | ICD-10-CM | POA: Diagnosis not present

## 2021-03-08 DIAGNOSIS — C61 Malignant neoplasm of prostate: Secondary | ICD-10-CM | POA: Diagnosis not present

## 2021-03-08 DIAGNOSIS — R1031 Right lower quadrant pain: Secondary | ICD-10-CM | POA: Diagnosis not present

## 2021-03-13 ENCOUNTER — Other Ambulatory Visit: Payer: Self-pay | Admitting: Surgery

## 2021-03-13 ENCOUNTER — Other Ambulatory Visit (HOSPITAL_COMMUNITY): Payer: Self-pay | Admitting: Surgery

## 2021-03-13 DIAGNOSIS — R1031 Right lower quadrant pain: Secondary | ICD-10-CM

## 2021-03-16 ENCOUNTER — Other Ambulatory Visit: Payer: PPO | Admitting: Urology

## 2021-03-20 ENCOUNTER — Encounter: Payer: Self-pay | Admitting: Urology

## 2021-03-21 ENCOUNTER — Ambulatory Visit
Admission: RE | Admit: 2021-03-21 | Discharge: 2021-03-21 | Disposition: A | Discharge status: Home / Self Care | Payer: PPO | Source: Ambulatory Visit | Attending: Surgery | Admitting: Surgery

## 2021-03-21 ENCOUNTER — Other Ambulatory Visit: Payer: Self-pay

## 2021-03-21 DIAGNOSIS — R1031 Right lower quadrant pain: Secondary | ICD-10-CM | POA: Diagnosis not present

## 2021-03-21 DIAGNOSIS — K5730 Diverticulosis of large intestine without perforation or abscess without bleeding: Secondary | ICD-10-CM | POA: Diagnosis not present

## 2021-03-21 DIAGNOSIS — K429 Umbilical hernia without obstruction or gangrene: Secondary | ICD-10-CM | POA: Diagnosis not present

## 2021-03-29 ENCOUNTER — Ambulatory Visit: Payer: PPO | Admitting: Urology

## 2021-03-29 ENCOUNTER — Other Ambulatory Visit: Payer: Self-pay

## 2021-03-29 ENCOUNTER — Encounter: Payer: Self-pay | Admitting: Urology

## 2021-03-29 VITALS — BP 126/69 | HR 73 | Ht 62.0 in | Wt 175.0 lb

## 2021-03-29 DIAGNOSIS — D090 Carcinoma in situ of bladder: Secondary | ICD-10-CM

## 2021-03-29 NOTE — Progress Notes (Signed)
   03/29/21  CC:  Chief Complaint  Patient presents with   Cysto   Indications: Carcinoma in situ bladder biopsies 02/23/2020 Completed 6 week course of BCG induction 06/13/2020  HPI:  Blood pressure 126/69, pulse 73, height 5\' 2"  (1.575 m), weight 175 lb (79.4 kg). NED. A&Ox3.   No respiratory distress   Abd soft, NT, ND Normal phallus with bilateral descended testicles  Cystoscopy Procedure Note  Patient identification was confirmed, informed consent was obtained, and patient was prepped using Betadine solution.  Lidocaine jelly was administered per urethral meatus.     Pre-Procedure: - Inspection reveals a normal caliber urethral meatus.  Procedure: The flexible cystoscope was introduced without difficulty - No urethral strictures/lesions are present. -  Moderate lateral lobe enlargement  prostate  - Elevated bladder neck, moderate - Bilateral ureteral orifices identified - Bladder mucosa  reveals no ulcers, tumors, or lesions - No bladder stones -Moderate trabeculation with cellules/diverticula   Retroflexion shows small intravesical prostatic tissue with hypervascularity  Post-Procedure: - Patient tolerated the procedure well  Assessment/ Plan: No bladder mucosal abnormalities identified Urine cytology ordered Continue quarterly surveillance until Fall 2023    Abbie Sons, MD

## 2021-03-31 LAB — CYTOLOGY - NON PAP

## 2021-04-03 ENCOUNTER — Telehealth: Payer: Self-pay | Admitting: *Deleted

## 2021-04-03 NOTE — Telephone Encounter (Signed)
-----   Message from Abbie Sons, MD sent at 03/31/2021  3:11 PM EDT ----- Urine cytology showed no cells suspicious for recurrent cancer

## 2021-04-03 NOTE — Telephone Encounter (Signed)
Notified patient as instructed, patient pleased °

## 2021-04-05 LAB — MICROSCOPIC EXAMINATION
Bacteria, UA: NONE SEEN
RBC, Urine: NONE SEEN /hpf (ref 0–2)
WBC, UA: NONE SEEN /hpf (ref 0–5)

## 2021-04-05 LAB — URINALYSIS, COMPLETE
Bilirubin, UA: NEGATIVE
Glucose, UA: NEGATIVE
Ketones, UA: NEGATIVE
Leukocytes,UA: NEGATIVE
Nitrite, UA: NEGATIVE
Protein,UA: NEGATIVE
RBC, UA: NEGATIVE
Specific Gravity, UA: 1.02 (ref 1.005–1.030)
Urobilinogen, Ur: 0.2 mg/dL (ref 0.2–1.0)
pH, UA: 5.5 (ref 5.0–7.5)

## 2021-05-01 DIAGNOSIS — E782 Mixed hyperlipidemia: Secondary | ICD-10-CM | POA: Diagnosis not present

## 2021-05-01 DIAGNOSIS — I714 Abdominal aortic aneurysm, without rupture, unspecified: Secondary | ICD-10-CM | POA: Diagnosis not present

## 2021-05-01 DIAGNOSIS — I2510 Atherosclerotic heart disease of native coronary artery without angina pectoris: Secondary | ICD-10-CM | POA: Diagnosis not present

## 2021-05-01 DIAGNOSIS — I10 Essential (primary) hypertension: Secondary | ICD-10-CM | POA: Diagnosis not present

## 2021-05-01 DIAGNOSIS — I6523 Occlusion and stenosis of bilateral carotid arteries: Secondary | ICD-10-CM | POA: Diagnosis not present

## 2021-05-15 NOTE — Progress Notes (Signed)
05/16/2021 4:08 PM   Ruben Green [birth year] [id number]  Referring provider: Tracie Harrier, MD [address] [facility] [place],  [place] [address]  Chief Complaint  Patient presents with   Dysuria   Urological history 1. Prostate cancer -PSA <0.01 in 08/2020 -T1c adenocarcinoma the prostate, high risk -IMRT 2019 -ADT 2021   2. Bladder cancer -Carcinoma in situ bladder biopsies 02/23/2020 -Completed 6 week course of BCG induction 06/13/2020 -cysto 03/2021 - NED -urine cytology-negative  HPI: Ruben Green is a 87 y.o. male who presents today for burning with urination.  UA benign.   PVR 89 mL   He states that last week he had very intense dysuria that happen with every void.  He states he increase his fluid intake and the dysuria is now intermittent and mild.  Patient denies any modifying or aggravating factors.  Patient denies any gross hematuria, dysuria or suprapubic/flank pain.  Patient denies any fevers, chills, nausea or vomiting.      PMH: Past Medical History:  Diagnosis Date   Anemia    Anxiety    BPH with obstruction/lower urinary tract symptoms    Chronic bronchitis (HCC)    COPD (chronic obstructive pulmonary disease) (HCC)    Elevated PSA    Environmental and seasonal allergies    HLD (hyperlipidemia)    HOH (hard of hearing)    HTN (hypertension)    Incomplete bladder emptying    Macular degeneration     Surgical History: Past Surgical History:  Procedure Laterality Date   ABDOMINAL AORTIC ANEURYSM REPAIR     CATARACT EXTRACTION W/PHACO Right 09/25/2016   Procedure: CATARACT EXTRACTION PHACO AND INTRAOCULAR LENS PLACEMENT ([place]);  Surgeon: Birder Robson, MD;  Location: [facility] ORS;  Service: Ophthalmology;  Laterality: Right;  [place] 01:01 AP% 17.4 CDE 10.75 Fluid pack lot # [id number]   CATARACT EXTRACTION W/PHACO Left 10/16/2016   Procedure: CATARACT EXTRACTION PHACO AND INTRAOCULAR LENS PLACEMENT ([place]) Suture  placed in Left eye;  Surgeon: Birder Robson, MD;  Location: [facility];  Service: Ophthalmology;  Laterality: Left;  [place] 2:06.8 AP% 22.2 CDE 28.17 Fluid pack lot # [id number]   COLONOSCOPY WITH PROPOFOL N/A 03/31/2019   Procedure: COLONOSCOPY WITH PROPOFOL;  Surgeon: Lollie Sails, MD;  Location[facility];  Service: Endoscopy;  Laterality: N/A;   COLONOSCOPY WITH PROPOFOL N/A 09/09/2020   Procedure: COLONOSCOPY WITH PROPOFOL;  Surgeon: Lesly Rubenstein, MD;  Location: [facility] ENDOSCOPY;  Service: Endoscopy;  Laterality: N/A;   CYSTOSCOPY W/ RETROGRADES Bilateral 02/23/2020   Procedure: CYSTOSCOPY WITH RETROGRADE PYELOGRAM;  Surgeon: Abbie Sons, MD;  Location: [facility] OR[facility];  Service: Urology;  Laterality: Bilateral;   CYSTOSCOPY WITH BIOPSY N/A 02/23/2020   Procedure: CYSTOSCOPY WITH BIOPSY;  Surgeon: Abbie Sons, MD;  Location: [facility] ORS;  Service: Urology;  Laterality: N/A;   EYE SURGERY     HERNIA REPAIR     x 2   INGUINAL HERNIA REPAIR Right 12/02/2015   Procedure: HERNIA REPAIR INGUINAL ADULT;  Surgeon: Leonie Green, MD;  Location: [facility] ORS;  Service: General;  Laterality: Right;   PROSTATE SURGERY  2012   Right Carotid Endarterectomy      Home Medications:  Allergies as of 05/16/2021   No Known Allergies      Medication List        Accurate as of May 16, 2021  4:08 PM. If you have any questions, ask your nurse or doctor.  STOP taking these medications    phenazopyridine 200 MG tablet Commonly known as: PYRIDIUM Stopped by: Tito Ausmus, PA-C   traMADol 50 MG tablet Commonly known as: ULTRAM Stopped by: Zara Council, PA-C       TAKE these medications    albuterol 108 (90 Base) MCG/ACT inhaler Commonly known as: VENTOLIN HFA Inhale 2 puffs into the lungs every 6 (six) hours as needed for wheezing or shortness of breath.   albuterol-ipratropium 18-103 MCG/ACT inhaler Commonly known as: COMBIVENT Inhale 1-2 puffs into the  lungs every 4 (four) hours.   amLODipine 10 MG tablet Commonly known as: NORVASC Take 10 mg by mouth daily.   aspirin 81 MG EC tablet Take by mouth.   atorvastatin 40 MG tablet Commonly known as: LIPITOR Take 40 mg by mouth at bedtime.   finasteride 5 MG tablet Commonly known as: Proscar Take 1 tablet (5 mg total) by mouth daily.   furosemide 20 MG tablet Commonly known as: LASIX Take 20 mg by mouth daily.   gabapentin 100 MG capsule Commonly known as: NEURONTIN Take 100 mg by mouth at bedtime.   hydrALAZINE 25 MG tablet Commonly known as: APRESOLINE Take 25 mg by mouth 2 (two) times daily.   iron polysaccharides 150 MG capsule Commonly known as: NIFEREX Take by mouth.   LORazepam 0.5 MG tablet Commonly known as: ATIVAN Take 0.5 mg by mouth every 8 (eight) hours.   losartan 100 MG tablet Commonly known as: COZAAR Take 100 mg by mouth daily.   tamsulosin 0.4 MG Caps capsule Commonly known as: FLOMAX Take 1 capsule (0.4 mg total) by mouth daily.   Trelegy Ellipta 100-62.5-25 MCG/ACT Aepb Generic drug: Fluticasone-Umeclidin-Vilant Inhale 1 puff into the lungs daily. What changed: Another medication with the same name was removed. Continue taking this medication, and follow the directions you see here. Changed by: Zara Council, PA-C        Allergies: No Known Allergies  Family History: Family History  Problem Relation Age of Onset   Cancer Brother        2000   Prostate cancer Neg Hx    Bladder Cancer Neg Hx    Kidney cancer Neg Hx     Social History:  reports that he quit smoking about 22 years ago. His smoking use included cigarettes. He has quit using smokeless tobacco. He reports current alcohol use. He reports that he does not use drugs.  ROS: Pertinent ROS in HPI  Physical Exam: BP (!) 158/66 (BP Location: Left Arm, Patient Position: Sitting, Cuff Size: Normal)   Pulse 76   Ht 5\' 2"  (1.575 m)   Wt 170 lb 14.4 oz (77.5 kg)   BMI 31.26  kg/m   Constitutional:  Well nourished. Alert and oriented, No acute distress. HEENT: Cope AT, mask in place.  Trachea midline Cardiovascular: No clubbing, cyanosis, or edema. Respiratory: Normal respiratory effort, no increased work of breathing. Neurologic: Grossly intact, no focal deficits, moving all 4 extremities. Psychiatric: Normal mood and affect.  Laboratory Data: Urinalysis Component     Latest Ref Rng & Units 05/16/2021  Specific Gravity, UA     1.005 - 1.030 1.015  pH, UA     5.0 - 7.5 5.5  Color, UA     Yellow Yellow  Appearance Ur     Clear Clear  Leukocytes,UA     Negative Negative  Protein,UA     Negative/Trace Negative  Glucose, UA     Negative Negative  Ketones, UA  Negative Negative  RBC, UA     Negative Negative  Bilirubin, UA     Negative Negative  Urobilinogen, Ur     0.2 - 1.0 mg/dL 0.2  Nitrite, UA     Negative Negative  Microscopic Examination      See below:   Component     Latest Ref Rng & Units 05/16/2021  WBC, UA     0 - 5 /hpf 6-10 (A)  RBC     0 - 2 /hpf 0-2  Epithelial Cells (non renal)     0 - 10 /hpf 0-10  Bacteria, UA     None seen/Few None seen     I have reviewed the labs.   Pertinent Imaging: Component     Latest Ref Rng & Units 05/16/2021  Scan Result      23mL    Assessment & Plan:    1. Dysuria -UA benign -urine culture -will wait on urine culture results to prescribe an antibiotic if appropriate -will reassess once urine culture returns  Return for pending urine cutlure .  These notes generated with voice recognition software. I apologize for typographical errors.  Zara Council, PA-C  [facility] [address]  [place], [place] [address] [phone]

## 2021-05-16 ENCOUNTER — Ambulatory Visit: Payer: PPO | Admitting: Urology

## 2021-05-16 ENCOUNTER — Other Ambulatory Visit: Payer: Self-pay

## 2021-05-16 ENCOUNTER — Encounter: Payer: Self-pay | Admitting: Urology

## 2021-05-16 VITALS — BP 158/66 | HR 76 | Ht 62.0 in | Wt 170.9 lb

## 2021-05-16 DIAGNOSIS — R300 Dysuria: Secondary | ICD-10-CM | POA: Diagnosis not present

## 2021-05-16 LAB — MICROSCOPIC EXAMINATION: Bacteria, UA: NONE SEEN

## 2021-05-16 LAB — URINALYSIS, COMPLETE
Bilirubin, UA: NEGATIVE
Glucose, UA: NEGATIVE
Ketones, UA: NEGATIVE
Leukocytes,UA: NEGATIVE
Nitrite, UA: NEGATIVE
Protein,UA: NEGATIVE
RBC, UA: NEGATIVE
Specific Gravity, UA: 1.015 (ref 1.005–1.030)
Urobilinogen, Ur: 0.2 mg/dL (ref 0.2–1.0)
pH, UA: 5.5 (ref 5.0–7.5)

## 2021-05-16 LAB — BLADDER SCAN AMB NON-IMAGING

## 2021-05-19 DIAGNOSIS — M1711 Unilateral primary osteoarthritis, right knee: Secondary | ICD-10-CM | POA: Diagnosis not present

## 2021-05-19 DIAGNOSIS — M1611 Unilateral primary osteoarthritis, right hip: Secondary | ICD-10-CM | POA: Diagnosis not present

## 2021-05-20 LAB — CULTURE, URINE COMPREHENSIVE

## 2021-05-24 DIAGNOSIS — M1611 Unilateral primary osteoarthritis, right hip: Secondary | ICD-10-CM | POA: Diagnosis not present

## 2021-05-24 DIAGNOSIS — M545 Low back pain, unspecified: Secondary | ICD-10-CM | POA: Diagnosis not present

## 2021-07-03 ENCOUNTER — Other Ambulatory Visit: Payer: PPO | Admitting: Urology

## 2021-07-10 DIAGNOSIS — M1611 Unilateral primary osteoarthritis, right hip: Secondary | ICD-10-CM | POA: Diagnosis not present

## 2021-07-11 ENCOUNTER — Other Ambulatory Visit: Payer: Self-pay | Admitting: Surgery

## 2021-07-17 ENCOUNTER — Other Ambulatory Visit: Payer: Self-pay | Admitting: Physician Assistant

## 2021-07-17 ENCOUNTER — Ambulatory Visit
Admission: RE | Admit: 2021-07-17 | Discharge: 2021-07-17 | Disposition: A | Discharge status: Home / Self Care | Payer: PPO | Source: Ambulatory Visit | Attending: Physician Assistant | Admitting: Physician Assistant

## 2021-07-17 ENCOUNTER — Other Ambulatory Visit (HOSPITAL_COMMUNITY): Payer: Self-pay | Admitting: Physician Assistant

## 2021-07-17 DIAGNOSIS — M79661 Pain in right lower leg: Secondary | ICD-10-CM | POA: Diagnosis not present

## 2021-07-17 DIAGNOSIS — I2510 Atherosclerotic heart disease of native coronary artery without angina pectoris: Secondary | ICD-10-CM | POA: Diagnosis not present

## 2021-07-17 DIAGNOSIS — Z01818 Encounter for other preprocedural examination: Secondary | ICD-10-CM | POA: Diagnosis not present

## 2021-07-17 DIAGNOSIS — C61 Malignant neoplasm of prostate: Secondary | ICD-10-CM | POA: Diagnosis not present

## 2021-07-17 DIAGNOSIS — M7989 Other specified soft tissue disorders: Secondary | ICD-10-CM | POA: Diagnosis not present

## 2021-07-17 DIAGNOSIS — M1611 Unilateral primary osteoarthritis, right hip: Secondary | ICD-10-CM | POA: Diagnosis not present

## 2021-07-17 DIAGNOSIS — Z8551 Personal history of malignant neoplasm of bladder: Secondary | ICD-10-CM | POA: Diagnosis not present

## 2021-07-19 ENCOUNTER — Telehealth: Payer: Self-pay | Admitting: Urology

## 2021-07-19 DIAGNOSIS — I6523 Occlusion and stenosis of bilateral carotid arteries: Secondary | ICD-10-CM | POA: Diagnosis not present

## 2021-07-19 DIAGNOSIS — I10 Essential (primary) hypertension: Secondary | ICD-10-CM | POA: Diagnosis not present

## 2021-07-19 DIAGNOSIS — I2510 Atherosclerotic heart disease of native coronary artery without angina pectoris: Secondary | ICD-10-CM | POA: Diagnosis not present

## 2021-07-19 DIAGNOSIS — E782 Mixed hyperlipidemia: Secondary | ICD-10-CM | POA: Diagnosis not present

## 2021-07-19 NOTE — Telephone Encounter (Signed)
PT has to postpone cysto due to having hip replacement on 2/14.  They will c/b to r/s appt.

## 2021-07-21 ENCOUNTER — Other Ambulatory Visit: Payer: Self-pay

## 2021-07-21 ENCOUNTER — Encounter
Admission: RE | Admit: 2021-07-21 | Discharge: 2021-07-21 | Disposition: A | Discharge status: Home / Self Care | Payer: PPO | Source: Ambulatory Visit | Attending: Surgery | Admitting: Surgery

## 2021-07-21 VITALS — BP 147/69 | HR 79 | Ht 62.0 in | Wt 171.7 lb

## 2021-07-21 DIAGNOSIS — Z01812 Encounter for preprocedural laboratory examination: Secondary | ICD-10-CM | POA: Diagnosis not present

## 2021-07-21 HISTORY — DX: Atherosclerotic heart disease of native coronary artery without angina pectoris: I25.10

## 2021-07-21 HISTORY — DX: Personal history of urinary calculi: Z87.442

## 2021-07-21 HISTORY — DX: Unspecified osteoarthritis, unspecified site: M19.90

## 2021-07-21 HISTORY — DX: Other forms of dyspnea: R06.09

## 2021-07-21 HISTORY — DX: Angina pectoris, unspecified: I20.9

## 2021-07-21 HISTORY — DX: Personal history of COVID-19: Z86.16

## 2021-07-21 HISTORY — DX: Occlusion and stenosis of unspecified carotid artery: I65.29

## 2021-07-21 HISTORY — DX: Unspecified cataract: H26.9

## 2021-07-21 HISTORY — DX: Deficiency of other specified B group vitamins: E53.8

## 2021-07-21 HISTORY — DX: Abdominal aortic aneurysm, without rupture, unspecified: I71.40

## 2021-07-21 HISTORY — DX: Malignant (primary) neoplasm, unspecified: C80.1

## 2021-07-21 LAB — COMPREHENSIVE METABOLIC PANEL
ALT: 12 U/L (ref 0–44)
AST: 17 U/L (ref 15–41)
Albumin: 3.6 g/dL (ref 3.5–5.0)
Alkaline Phosphatase: 92 U/L (ref 38–126)
Anion gap: 7 (ref 5–15)
BUN: 38 mg/dL — ABNORMAL HIGH (ref 8–23)
CO2: 25 mmol/L (ref 22–32)
Calcium: 9.2 mg/dL (ref 8.9–10.3)
Chloride: 106 mmol/L (ref 98–111)
Creatinine, Ser: 1.26 mg/dL — ABNORMAL HIGH (ref 0.61–1.24)
GFR, Estimated: 56 mL/min — ABNORMAL LOW (ref >60)
Glucose, Bld: 93 mg/dL (ref 70–99)
Potassium: 5 mmol/L (ref 3.5–5.1)
Sodium: 138 mmol/L (ref 135–145)
Total Bilirubin: 0.5 mg/dL (ref 0.3–1.2)
Total Protein: 7.2 g/dL (ref 6.5–8.1)

## 2021-07-21 LAB — CBC WITH DIFFERENTIAL/PLATELET
Abs Immature Granulocytes: 0.03 10*3/uL (ref 0.00–0.07)
Basophils Absolute: 0 10*3/uL (ref 0.0–0.1)
Basophils Relative: 1 %
Eosinophils Absolute: 0 10*3/uL (ref 0.0–0.5)
Eosinophils Relative: 0 %
HCT: 32.6 % — ABNORMAL LOW (ref 39.0–52.0)
Hemoglobin: 9.9 g/dL — ABNORMAL LOW (ref 13.0–17.0)
Immature Granulocytes: 1 %
Lymphocytes Relative: 11 %
Lymphs Abs: 0.6 10*3/uL — ABNORMAL LOW (ref 0.7–4.0)
MCH: 28 pg (ref 26.0–34.0)
MCHC: 30.4 g/dL (ref 30.0–36.0)
MCV: 92.4 fL (ref 80.0–100.0)
Monocytes Absolute: 0.7 10*3/uL (ref 0.1–1.0)
Monocytes Relative: 13 %
Neutro Abs: 4.3 10*3/uL (ref 1.7–7.7)
Neutrophils Relative %: 74 %
Platelets: 240 10*3/uL (ref 150–400)
RBC: 3.53 MIL/uL — ABNORMAL LOW (ref 4.22–5.81)
RDW: 15.3 % (ref 11.5–15.5)
WBC: 5.7 10*3/uL (ref 4.0–10.5)
nRBC: 0 % (ref 0.0–0.2)

## 2021-07-21 LAB — URINALYSIS, MICROSCOPIC (REFLEX): Bacteria, UA: NONE SEEN

## 2021-07-21 LAB — URINALYSIS, ROUTINE W REFLEX MICROSCOPIC
Bilirubin Urine: NEGATIVE
Glucose, UA: NEGATIVE mg/dL
Ketones, ur: NEGATIVE mg/dL
Leukocytes,Ua: NEGATIVE
Nitrite: NEGATIVE
Protein, ur: NEGATIVE mg/dL
Specific Gravity, Urine: 1.015 (ref 1.005–1.030)
pH: 5.5 (ref 5.0–8.0)

## 2021-07-21 LAB — TYPE AND SCREEN
ABO/RH(D): A NEG
Antibody Screen: NEGATIVE

## 2021-07-21 LAB — SURGICAL PCR SCREEN
MRSA, PCR: NEGATIVE
Staphylococcus aureus: NEGATIVE

## 2021-07-21 NOTE — Patient Instructions (Addendum)
Your procedure is scheduled on: 08/01/21 - Tuesday Report to the Registration Desk on the 1st floor of the [facility]. To find out your arrival time, please call [phone] between 1PM - 3PM on: 07/31/21 - Monday Covid Test at [place] on 07/28/21 between 8 am and 12 noon.  REMEMBER: Instructions that are not followed completely may result in serious medical risk, up to and including death; or upon the discretion of your surgeon and anesthesiologist your surgery may need to be rescheduled.  Do not eat food after midnight the night before surgery.  No gum chewing, lozengers or hard candies.  You may however, drink CLEAR liquids up to 2 hours before you are scheduled to arrive for your surgery. Do not drink anything within 2 hours of your scheduled arrival time.  Clear liquids include: - water  - apple juice without pulp - gatorade (not RED, PURPLE, OR BLUE) - black coffee or tea (Do NOT add milk or creamers to the coffee or tea) Do NOT drink anything that is not on this list.  In addition, your doctor has ordered for you to drink the provided  Ensure Pre-Surgery Clear Carbohydrate Drink  Drinking this carbohydrate drink up to two hours before surgery helps to reduce insulin resistance and improve patient outcomes. Please complete drinking 2 hours prior to scheduled arrival time.  TAKE THESE MEDICATIONS THE MORNING OF SURGERY WITH A SIP OF WATER:  - amLODipine (NORVASC) 10 MG tablet - Fluticasone-Umeclidin-Vilant (TRELEGY ELLIPTA) 100-62.5-25 MCG/INH AEPB  Use albuterol (PROVENTIL)  on the day of surgery and bring to the hospital.  One week prior to surgery: Stop Anti-inflammatories (NSAIDS) such as Advil, Aleve, Ibuprofen, Motrin, Naproxen, Naprosyn and Aspirin based products such as Excedrin, Goodys Powder, BC Powder.  Stop ANY OVER THE COUNTER supplements until after surgery.  You may however, continue to take Tylenol if needed for pain up until the day of  surgery.  No Alcohol for 24 hours before or after surgery.  No Smoking including e-cigarettes for 24 hours prior to surgery.  No chewable tobacco products for at least 6 hours prior to surgery.  No nicotine patches on the day of surgery.  Do not use any "recreational" drugs for at least a week prior to your surgery.  Please be advised that the combination of cocaine and anesthesia may have negative outcomes, up to and including death. If you test positive for cocaine, your surgery will be cancelled.  On the morning of surgery brush your teeth with toothpaste and water, you may rinse your mouth with mouthwash if you wish. Do not swallow any toothpaste or mouthwash.  Use CHG Soap or wipes as directed on instruction sheet.  Do not wear jewelry, make-up, hairpins, clips or nail polish.  Do not wear lotions, powders, or perfumes.   Do not shave body from the neck down 48 hours prior to surgery just in case you cut yourself which could leave a site for infection.  Also, freshly shaved skin may become irritated if using the CHG soap.  Contact lenses, hearing aids and dentures may not be worn into surgery.  Do not bring valuables to the hospital. [facility] is not responsible for any missing/lost belongings or valuables.   Notify your doctor if there is any change in your medical condition (cold, fever, infection).  Wear comfortable clothing (specific to your surgery type) to the hospital.  After surgery, you can help prevent lung complications by doing breathing exercises.  Take deep  breaths and cough every 1-2 hours. Your doctor may order a device called an Incentive Spirometer to help you take deep breaths. When coughing or sneezing, hold a pillow firmly against your incision with both hands. This is called splinting. Doing this helps protect your incision. It also decreases belly discomfort.  If you are being admitted to the hospital overnight, leave your suitcase in the  car. After surgery it may be brought to your room.  If you are being discharged the day of surgery, you will not be allowed to drive home. You will need a responsible adult (18 years or older) to drive you home and stay with you that night.   If you are taking public transportation, you will need to have a responsible adult (18 years or older) with you. Please confirm with your physician that it is acceptable to use public transportation.   Please call the [facility] at [phone] if you have any questions about these instructions.  Surgery Visitation Policy:  Patients undergoing a surgery or procedure may have one family member or support person with them as long as that person is not COVID-19 positive or experiencing its symptoms.  That person may remain in the waiting area during the procedure and may rotate out with other people.  Inpatient Visitation:    Visiting hours are 7 a.m. to 8 p.m. Up to two visitors ages 16+ are allowed at one time in a patient room. The visitors may rotate out with other people during the day. Visitors must check out when they leave, or other visitors will not be allowed. One designated support person may remain overnight. The visitor must pass COVID-19 screenings, use hand sanitizer when entering and exiting the patients room and wear a mask at all times, including in the patients room. Patients must also wear a mask when staff or their visitor are in the room. Masking is required regardless of vaccination status.

## 2021-07-21 NOTE — Pre-Procedure Instructions (Signed)
Patient here for PAT appointment with wife [name] to help with language barrier, patient is also HOH, instructions reviewed with both , no questions at this time.

## 2021-07-24 ENCOUNTER — Encounter: Payer: Self-pay | Admitting: Surgery

## 2021-07-24 ENCOUNTER — Other Ambulatory Visit: Payer: PPO | Admitting: Urology

## 2021-07-24 DIAGNOSIS — H353212 Exudative age-related macular degeneration, right eye, with inactive choroidal neovascularization: Secondary | ICD-10-CM | POA: Diagnosis not present

## 2021-07-24 NOTE — Progress Notes (Signed)
Perioperative Services  Pre-Admission/Anesthesia Testing Clinical Review  Date: 07/24/21  Patient Demographics:  Name: Ruben Green DOB:   02-07-1934 MRN:   [id number]  Planned Surgical Procedure(s):    Case: [id number] Date/Time: 08/01/21 0715   Procedure: TOTAL HIP ARTHROPLASTY (Right: Hip)   Anesthesia type: Choice   Pre-op diagnosis: Primary osteoarthritis of right hip M16.11   Location: [facility] OR ROOM 03 / [place] [facility]S FOR ANESTH[facility]   Surgeons: Corky Mull, MD   NOTE: Available PAT nursing documentation and vital signs have been reviewed. Clinical nursing staff has updated patient's PMH/PSHx, current medication list, and drug allergies/intolerances to ensure comprehensive history available to assist in medical decision making as it pertains to the aforementioned surgical procedure and anticipated anesthetic course. Extensive review of available clinical information performed. [place] PMH and PSHx updated with any diagnoses/procedures that  may have been inadvertently omitted during his intake with the pre-admission testing department's nursing staff.  Clinical Discussion:  Ruben Green is a 88 y.o. male who is submitted for pre-surgical anesthesia review and clearance prior to him undergoing the above procedure. Patient is a Former Smoker (quit 11/1998). Pertinent PMH includes: CAD, angina, AAA (s/p repair), carotid artery stenosis (s/p CEA on the RIGHT), RBBB, diastolic dysfunction, valvular insufficiency, HTN, HLD, DOE, COPD, anemia, OA, BPH, anxiety (on BZO).   Patient is followed by cardiology Nehemiah Massed, MD). He was last seen in the cardiology clinic on 07/19/2021; notes reviewed.  At the time of his clinic visit, patient doing well overall from a cardiovascular perspective.  He denied any episodes of chest pain, however had chronic exertional dyspnea related to his underlying COPD diagnosis.  Patient denied any PND, orthopnea, palpitations, significant peripheral  edema, vertiginous symptoms, or presyncope/syncope.  Patient complained of pain in his RIGHT hip.  Past medical history significant for cardiovascular diagnoses.  Patient with a history of a AAA for which he underwent repair procedure back in 2005.  Patient also with BILATERAL carotid artery disease.  He has undergone CEA on the RIGHT in the past.  Myocardial perfusion imaging study performed on 07/01/2019 revealing an LVEF of 51%.  There was inferior wall hypokinesis noted.  Patient experienced chest pain at peak stress giving rise to concerns for ischemia.  There was a fixed inferior myocardial perfusion defects noted that was felt to be consistent with previous infarct and/or scar.  Additionally, there was a minimally reversible posterior lateral myocardial perfusion defect consistent with ischemia.  TTE performed on 07/29/2019 revealing a normal left ventricular systolic function with an estimated EF of 50-55%.  There was trivial AR and PR, mild MR, and moderate TR noted.  There was no evidence of valvular stenosis.  Left atrium mildly enlarged.  Diastolic Doppler parameters consistent with impaired relaxation (G1DD). There is no evidence of a transvalvular gradient to suggest stenosis.  Blood pressure elevated at 158/70 on currently prescribed CCB and ARB therapies.  Patient is on a statin for his HLD.  He is not diabetic. Functional capacity, as defined by DASI, is documented as being >/= 4 METS.  No changes were made to his medication regimen.  Patient to follow-up with outpatient cardiology in 6 months or sooner if needed.  Ruben Green is scheduled for an elective RIGHT TOTAL HIP ARTHROPLASTY on 08/01/2021 with Dr. Milagros Evener, MD.  Given patient's past medical history significant for cardiovascular diagnoses, presurgical cardiac clearance was sought by the PAT team."The patient is at the lowest risk possible for perioperative cardiovascular complications  with the planned procedure.  The  overall risk his procedure is low (<1%).  Currently has no evidence active and/or significant angina and/or congestive heart failure. Patient may proceed to surgery without restriction or need for further cardiovascular testing and an overall LOW risk".  In review of his medication reconciliation, it is noted the patient is not on any type of anticoagulation or antiplatelet therapies that would need to be held during the perioperative period.  Patient denies previous perioperative complications with anesthesia in the past. In review of the available records, it is noted that patient underwent a general anesthetic course here (ASA III) in 08/2020 without documented complications.   Vitals with BMI 07/21/2021 05/16/2021 03/29/2021  Height 5\' 2"  5\' 2"  5\' 2"   Weight 171 lbs 12 oz 170 lbs 14 oz 175 lbs  BMI 36.1 44.31 32  Systolic 540 086 761  Diastolic 69 66 69  Pulse 79 76 73    Providers/Specialists:   NOTE: Primary physician provider listed below. Patient may have been seen by APP or partner within same practice.   PROVIDER ROLE / SPECIALTY LAST OV  Poggi, Marshall Cork, MD Orthopedics (Surgeon) 07/10/2021  Tracie Harrier, MD Primary Care Provider 07/17/2021  Serafina Royals, MD Cardiology 07/19/2021   Allergies:  Patient has no known allergies.  Current Home Medications:   No current facility-administered medications for this encounter.    acetaminophen (TYLENOL) 500 MG tablet   albuterol (PROVENTIL HFA;VENTOLIN HFA) 108 (90 Base) MCG/ACT inhaler   amLODipine (NORVASC) 10 MG tablet   atorvastatin (LIPITOR) 40 MG tablet   Fluticasone-Umeclidin-Vilant (TRELEGY ELLIPTA) 100-62.5-25 MCG/INH AEPB   gabapentin (NEURONTIN) 100 MG capsule   iron polysaccharides (NIFEREX) 150 MG capsule   LORazepam (ATIVAN) 0.5 MG tablet   losartan (COZAAR) 100 MG tablet   vitamin B-12 (CYANOCOBALAMIN) 1000 MCG tablet   finasteride (PROSCAR) 5 MG tablet   tamsulosin (FLOMAX) 0.4 MG CAPS capsule   History:    Past Medical History:  Diagnosis Date   AAA (abdominal aortic aneurysm)    a.) s/p repair in 2005   Anemia    Anginal pain (HCC)    Anxiety    Arthritis    B12 deficiency    Bilateral cataracts    a.) s/p BILATERAL extractions in 2018   BPH with obstruction/lower urinary tract symptoms    CAD (coronary artery disease)    Carotid artery stenosis    a.) s/p CEA on the RIGHT   COPD (chronic obstructive pulmonary disease) (HCC)    DOE (dyspnea on exertion)    Elevated PSA    Environmental and seasonal allergies    History of 2019 novel coronavirus disease (COVID-19)    History of kidney stones    HLD (hyperlipidemia)    HOH (hard of hearing)    HTN (hypertension)    Incomplete bladder emptying    Macular degeneration    Prostate cancer (HCC)    RBBB (right bundle branch block)    Valvular insufficiency    a.) TTE 07/29/2019: LVEF 50-55%; LA mild enlarged; triv AR/PR, mild MR, mod TR.   Past Surgical History:  Procedure Laterality Date   ABDOMINAL AORTIC ANEURYSM REPAIR     CATARACT EXTRACTION W/PHACO Right 09/25/2016   Procedure: CATARACT EXTRACTION PHACO AND INTRAOCULAR LENS PLACEMENT (IOC);  Surgeon: Birder Robson, MD;  Location: [facility] ORS;  Service: Ophthalmology;  Laterality: Right;  [place] 01:01 AP% 17.4 CDE 10.75 Fluid pack lot # [id number]   CATARACT EXTRACTION W/PHACO Left 10/16/2016  Procedure: CATARACT EXTRACTION PHACO AND INTRAOCULAR LENS PLACEMENT ([place]) Suture placed in Left eye;  Surgeon: Birder Robson, MD;  Location: [facility] ORS;  Service: Ophthalmology;  Laterality: Left;  [place] 2:06.8 AP% 22.2 CDE 28.17 Fluid pack lot # [id number]   COLONOSCOPY WITH PROPOFOL N/A 03/31/2019   Procedure: COLONOSCOPY WITH PROPOFOL;  Surgeon: Lollie Sails, MD;  Location: [facility] ENDOSCOPY;  Service: Endoscopy;  Laterality: N/A;   COLONOSCOPY WITH PROPOFOL N/A 09/09/2020   Procedure: COLONOSCOPY WITH PROPOFOL;  Surgeon: Lesly Rubenstein, MD;  Location: [facility] ENDOSCOPY;  Service:  Endoscopy;  Laterality: N/A;   CYSTOSCOPY W/ RETROGRADES Bilateral 02/23/2020   Procedure: CYSTOSCOPY WITH RETROGRADE PYELOGRAM;  Surgeon: Abbie Sons, MD;  Location: [facility] ORS;  Service: Urology;  Laterality: Bilateral;   CYSTOSCOPY WITH BIOPSY N/A 02/23/2020   Procedure: CYSTOSCOPY WITH BIOPSY;  Surgeon: Abbie Sons, MD;  Location: [facility] ORS;  Service: Urology;  Laterality: N/A;   EYE SURGERY     HERNIA REPAIR     x 2   INGUINAL HERNIA REPAIR Right 12/02/2015   Procedure: HERNIA REPAIR INGUINAL ADULT;  Surgeon: Leonie Green, MD;  Location: [facility] ORS;  Service: General;  Laterality: Right;   PROSTATE SURGERY  2012   Right Carotid Endarterectomy     Family History  Problem Relation Age of Onset   Cancer Brother        2000   Prostate cancer Neg Hx    Bladder Cancer Neg Hx    Kidney cancer Neg Hx    Social History   Tobacco Use   Smoking status: Former    Types: Cigarettes    Quit date: 11/30/1998    Years since quitting: 22.6   Smokeless tobacco: Former   Tobacco comments:    quit 10 years ago  Vaping Use   Vaping Use: Never used  Substance Use Topics   Alcohol use: Not Currently    Comment: occasional   Drug use: No    Pertinent Clinical Results:  LABS: Labs reviewed: Acceptable for surgery.  No visits with results within 3 Day(s) from this visit.  Latest known visit with results is:  Hospital Outpatient Visit on 07/21/2021  Component Date Value Ref Range Status   MRSA, PCR 07/21/2021 NEGATIVE  NEGATIVE Final   Staphylococcus aureus 07/21/2021 NEGATIVE  NEGATIVE Final   Comment: (NOTE) The Xpert SA Assay (FDA approved for NASAL specimens in patients 14 years of age and older), is one component of a comprehensive surveillance program. It is not intended to diagnose infection nor to guide or monitor treatment. Performed at [facility], [address] [place], [place] [address]    WBC 07/21/2021 5.7  4.0 - 10.5 K/uL Final   RBC 07/21/2021  3.53 (L)  4.22 - 5.81 MIL/uL Final   Hemoglobin 07/21/2021 9.9 (L)  13.0 - 17.0 g/dL Final   HCT 07/21/2021 32.6 (L)  39.0 - 52.0 % Final   MCV 07/21/2021 92.4  80.0 - 100.0 fL Final   MCH 07/21/2021 28.0  26.0 - 34.0 pg Final   MCHC 07/21/2021 30.4  30.0 - 36.0 g/dL Final   RDW 07/21/2021 15.3  11.5 - 15.5 % Final   Platelets 07/21/2021 240  150 - 400 K/uL Final   nRBC 07/21/2021 0.0  0.0 - 0.2 % Final   Neutrophils Relative % 07/21/2021 74  % Final   Neutro Abs 07/21/2021 4.3  1.7 - 7.7 K/uL Final   Lymphocytes Relative 07/21/2021 11  % Final  Lymphs Abs 07/21/2021 0.6 (L)  0.7 - 4.0 K/uL Final   Monocytes Relative 07/21/2021 13  % Final   Monocytes Absolute 07/21/2021 0.7  0.1 - 1.0 K/uL Final   Eosinophils Relative 07/21/2021 0  % Final   Eosinophils Absolute 07/21/2021 0.0  0.0 - 0.5 K/uL Final   Basophils Relative 07/21/2021 1  % Final   Basophils Absolute 07/21/2021 0.0  0.0 - 0.1 K/uL Final   Immature Granulocytes 07/21/2021 1  % Final   Abs Immature Granulocytes 07/21/2021 0.03  0.00 - 0.07 K/uL Final   Performed at [facility], [place], [place] [address]   Sodium 07/21/2021 138  135 - 145 mmol/L Final   Potassium 07/21/2021 5.0  3.5 - 5.1 mmol/L Final   Chloride 07/21/2021 106  98 - 111 mmol/L Final   CO2 07/21/2021 25  22 - 32 mmol/L Final   Glucose, Bld 07/21/2021 93  70 - 99 mg/dL Final   Glucose reference range applies only to samples taken after fasting for at least 8 hours.   BUN 07/21/2021 38 (H)  8 - 23 mg/dL Final   Creatinine, Ser 07/21/2021 1.26 (H)  0.61 - 1.24 mg/dL Final   Calcium 07/21/2021 9.2  8.9 - 10.3 mg/dL Final   Total Protein 07/21/2021 7.2  6.5 - 8.1 g/dL Final   Albumin 07/21/2021 3.6  3.5 - 5.0 g/dL Final   AST 07/21/2021 17  15 - 41 U/L Final   ALT 07/21/2021 12  0 - 44 U/L Final   Alkaline Phosphatase 07/21/2021 92  38 - 126 U/L Final   Total Bilirubin 07/21/2021 0.5  0.3 - 1.2 mg/dL Final   GFR, Estimated  07/21/2021 56 (L)  >60 mL/min Final   Comment: (NOTE) Calculated using the CKD-EPI Creatinine Equation (2021)    Anion gap 07/21/2021 7  5 - 15 Final   Performed at [facility], [place], [place] [address]   Color, Urine 07/21/2021 YELLOW  YELLOW Final   APPearance 07/21/2021 CLEAR  CLEAR Final   Specific Gravity, Urine 07/21/2021 1.015  1.005 - 1.030 Final   pH 07/21/2021 5.5  5.0 - 8.0 Final   Glucose, UA 07/21/2021 NEGATIVE  NEGATIVE mg/dL Final   Hgb urine dipstick 07/21/2021 TRACE (A)  NEGATIVE Final   Bilirubin Urine 07/21/2021 NEGATIVE  NEGATIVE Final   Ketones, ur 07/21/2021 NEGATIVE  NEGATIVE mg/dL Final   Protein, ur 07/21/2021 NEGATIVE  NEGATIVE mg/dL Final   Nitrite 07/21/2021 NEGATIVE  NEGATIVE Final   Leukocytes,Ua 07/21/2021 NEGATIVE  NEGATIVE Final   Performed at [facility], [address] [place], [place] [address]   ABO/RH(D) 07/21/2021 A NEG   Final   Antibody Screen 07/21/2021 NEG   Final   Sample Expiration 07/21/2021 08/04/2021,2359   Final   Extend sample reason 07/21/2021    Final                   Value:NO TRANSFUSIONS OR PREGNANCY IN THE PAST 3 MONTHS Performed at [facility], [address] [place], [place] [address]    RBC / HPF 07/21/2021 0-5  0 - 5 RBC/hpf Final   WBC, UA 07/21/2021 0-5  0 - 5 WBC/hpf Final   Bacteria, UA 07/21/2021 NONE SEEN  NONE SEEN Final   Squamous Epithelial / LPF 07/21/2021 0-5  0 - 5 Final   Mucus 07/21/2021 PRESENT   Final   Performed at [facility], [address] [place], [place] [address]    ECG: Date: 05/01/2021 Rate: 73 bpm Rhythm:  Normal sinus rhythm; right bundle branch block Axis (leads I and aVF): Normal Intervals: PR 172 ms. QRS 134 ms. QTc 449 ms. ST segment and T wave changes: No evidence of acute ST segment elevation or depression Comparison: Similar to previous tracing obtained on 02/17/2020   IMAGING / PROCEDURES: TRANSTHORACIC  ECHOCARDIOGRAM performed on 07/29/2019 Normal left ventricular systolic function with an estimated EF of 50-55% Normal right ventricular systolic function Mild left atrial enlargement Diastolic parameters consistent with impaired relaxation (G1DD) Trivial AR, PR Mild MR Moderate TR No valvular stenosis  MYOCARDIAL PERFUSION IMAGING STUDY (LEXISCAN) performed on 07/01/2019 LVEF 51% Inferior hypokinesis No artifact noted Left ventricular cavity size normal Chest pain at peak stress consistent with ischemic change concerns Fixed inferior myocardial perfusion defect consistent with previous infarct and/or scar Minimal reversible posterior lateral myocardial perfusion defect consistent with ischemia  Impression and Plan:  Ruben Green has been referred for pre-anesthesia review and clearance prior to him undergoing the planned anesthetic and procedural courses. Available labs, pertinent testing, and imaging results were personally reviewed by me. This patient has been appropriately cleared by cardiology with an overall LOW risk of significant perioperative cardiovascular complications.  Based on clinical review performed today (07/24/21), barring any significant acute changes in the patient's overall condition, it is anticipated that he will be able to proceed with the planned surgical intervention. Any acute changes in clinical condition may necessitate his procedure being postponed and/or cancelled. Patient will meet with anesthesia team (MD and/or CRNA) on the day of his procedure for preoperative evaluation/assessment. Questions regarding anesthetic course will be fielded at that time.   Pre-surgical instructions were reviewed with the patient during his PAT appointment and questions were fielded by PAT clinical staff. Patient was advised that if any questions or concerns arise prior to his procedure then he should return a call to PAT and/or his surgeon's office to discuss.  Honor Loh, MSN, APRN, FNP-C, CEN [facility]  Peri-operative Services Nurse Practitioner Phone: [phone] Fax: [phone] 07/24/21 2:09 PM  NOTE: This note has been prepared using Dragon dictation software. Despite my best ability to proofread, there is always the potential that unintentional transcriptional errors may still occur from this process.

## 2021-07-27 NOTE — Progress Notes (Signed)
The patient is scheduled for a right total hip arthroplasty.  This often can be associated with significant blood loss.  Therefore, I feel it is important to know what his hemoglobin and hematocrit status is preoperatively.  In addition, I need to be sure that his platelet count is adequate for this elective procedure, and that he does not have an an abnormally high white count to suggest an occult infection residing somewhere in his body.  These are my justifications and rationale for ordering the CBC preoperatively.  Thank you!

## 2021-07-28 ENCOUNTER — Other Ambulatory Visit: Payer: Self-pay

## 2021-07-28 ENCOUNTER — Other Ambulatory Visit
Admission: RE | Admit: 2021-07-28 | Discharge: 2021-07-28 | Disposition: A | Discharge status: Home / Self Care | Payer: PPO | Source: Ambulatory Visit | Attending: Surgery | Admitting: Surgery

## 2021-07-28 DIAGNOSIS — Z01812 Encounter for preprocedural laboratory examination: Secondary | ICD-10-CM | POA: Diagnosis not present

## 2021-07-28 DIAGNOSIS — Z20822 Contact with and (suspected) exposure to covid-19: Secondary | ICD-10-CM | POA: Diagnosis not present

## 2021-07-28 LAB — SARS CORONAVIRUS 2 (TAT 6-24 HRS): SARS Coronavirus 2: NEGATIVE

## 2021-08-01 ENCOUNTER — Encounter: Payer: Self-pay | Admitting: Surgery

## 2021-08-01 ENCOUNTER — Other Ambulatory Visit: Payer: Self-pay

## 2021-08-01 ENCOUNTER — Encounter
Admission: RE | Disposition: A | Discharge status: Home Health | Payer: Self-pay | Source: Ambulatory Visit | Attending: Surgery

## 2021-08-01 ENCOUNTER — Inpatient Hospital Stay
Admission: RE | Admit: 2021-08-01 | Discharge: 2021-08-05 | DRG: 470 | Disposition: A | Discharge status: Home Health | Payer: PPO | Source: Ambulatory Visit | Attending: Surgery | Admitting: Surgery

## 2021-08-01 ENCOUNTER — Ambulatory Visit: Payer: PPO | Admitting: Urgent Care

## 2021-08-01 ENCOUNTER — Inpatient Hospital Stay: Payer: PPO

## 2021-08-01 DIAGNOSIS — Z9842 Cataract extraction status, left eye: Secondary | ICD-10-CM | POA: Diagnosis not present

## 2021-08-01 DIAGNOSIS — I10 Essential (primary) hypertension: Secondary | ICD-10-CM | POA: Diagnosis present

## 2021-08-01 DIAGNOSIS — Z8616 Personal history of COVID-19: Secondary | ICD-10-CM

## 2021-08-01 DIAGNOSIS — I2510 Atherosclerotic heart disease of native coronary artery without angina pectoris: Secondary | ICD-10-CM | POA: Diagnosis present

## 2021-08-01 DIAGNOSIS — Z96641 Presence of right artificial hip joint: Principal | ICD-10-CM

## 2021-08-01 DIAGNOSIS — Z9841 Cataract extraction status, right eye: Secondary | ICD-10-CM | POA: Diagnosis not present

## 2021-08-01 DIAGNOSIS — Z8249 Family history of ischemic heart disease and other diseases of the circulatory system: Secondary | ICD-10-CM | POA: Diagnosis not present

## 2021-08-01 DIAGNOSIS — J9811 Atelectasis: Secondary | ICD-10-CM | POA: Diagnosis not present

## 2021-08-01 DIAGNOSIS — R0902 Hypoxemia: Secondary | ICD-10-CM | POA: Diagnosis not present

## 2021-08-01 DIAGNOSIS — E785 Hyperlipidemia, unspecified: Secondary | ICD-10-CM | POA: Diagnosis present

## 2021-08-01 DIAGNOSIS — Z79899 Other long term (current) drug therapy: Secondary | ICD-10-CM

## 2021-08-01 DIAGNOSIS — Z471 Aftercare following joint replacement surgery: Secondary | ICD-10-CM | POA: Diagnosis not present

## 2021-08-01 DIAGNOSIS — Z8546 Personal history of malignant neoplasm of prostate: Secondary | ICD-10-CM

## 2021-08-01 DIAGNOSIS — F411 Generalized anxiety disorder: Secondary | ICD-10-CM | POA: Diagnosis present

## 2021-08-01 DIAGNOSIS — Z923 Personal history of irradiation: Secondary | ICD-10-CM

## 2021-08-01 DIAGNOSIS — M1611 Unilateral primary osteoarthritis, right hip: Principal | ICD-10-CM | POA: Diagnosis present

## 2021-08-01 DIAGNOSIS — J439 Emphysema, unspecified: Secondary | ICD-10-CM | POA: Diagnosis not present

## 2021-08-01 DIAGNOSIS — I714 Abdominal aortic aneurysm, without rupture, unspecified: Secondary | ICD-10-CM | POA: Diagnosis present

## 2021-08-01 DIAGNOSIS — J449 Chronic obstructive pulmonary disease, unspecified: Secondary | ICD-10-CM | POA: Diagnosis present

## 2021-08-01 DIAGNOSIS — Z87891 Personal history of nicotine dependence: Secondary | ICD-10-CM | POA: Diagnosis not present

## 2021-08-01 DIAGNOSIS — K449 Diaphragmatic hernia without obstruction or gangrene: Secondary | ICD-10-CM | POA: Diagnosis not present

## 2021-08-01 HISTORY — DX: Endocarditis, valve unspecified: I38

## 2021-08-01 HISTORY — DX: Malignant neoplasm of prostate: C61

## 2021-08-01 HISTORY — DX: Unspecified right bundle-branch block: I45.10

## 2021-08-01 HISTORY — PX: TOTAL HIP ARTHROPLASTY: SHX124

## 2021-08-01 LAB — POCT I-STAT, CHEM 8
BUN: 33 mg/dL — ABNORMAL HIGH (ref 8–23)
Calcium, Ion: 1.25 mmol/L (ref 1.15–1.40)
Chloride: 108 mmol/L (ref 98–111)
Creatinine, Ser: 1.2 mg/dL (ref 0.61–1.24)
Glucose, Bld: 208 mg/dL — ABNORMAL HIGH (ref 70–99)
HCT: 30 % — ABNORMAL LOW (ref 39.0–52.0)
Hemoglobin: 10.2 g/dL — ABNORMAL LOW (ref 13.0–17.0)
Potassium: 4.4 mmol/L (ref 3.5–5.1)
Sodium: 140 mmol/L (ref 135–145)
TCO2: 21 mmol/L — ABNORMAL LOW (ref 22–32)

## 2021-08-01 LAB — ABO/RH: ABO/RH(D): A NEG

## 2021-08-01 SURGERY — ARTHROPLASTY, HIP, TOTAL,POSTERIOR APPROACH
Anesthesia: Spinal | Site: Hip | Laterality: Right

## 2021-08-01 MED ORDER — ONDANSETRON HCL 4 MG/2ML IJ SOLN: 4.0000 mg | Freq: Four times a day (QID) | INTRAMUSCULAR | Status: DC | PRN

## 2021-08-01 MED ORDER — MAGNESIUM HYDROXIDE 400 MG/5ML PO SUSP
30.0000 mL | Freq: Every day | ORAL | Status: DC | PRN
  Administered 2021-08-02 – 2021-08-03 (×2): 30 mL via ORAL
  Filled 2021-08-01 (×2): qty 30

## 2021-08-01 MED ORDER — DIPHENHYDRAMINE HCL 12.5 MG/5ML PO ELIX: 12.5000 mg | ORAL_SOLUTION | ORAL | Status: DC | PRN

## 2021-08-01 MED ORDER — FENTANYL CITRATE (PF) 100 MCG/2ML IJ SOLN
INTRAMUSCULAR | Status: AC
  Filled 2021-08-01: qty 2

## 2021-08-01 MED ORDER — LOSARTAN POTASSIUM 50 MG PO TABS
100.0000 mg | ORAL_TABLET | Freq: Every day | ORAL | Status: DC
Start: 2021-08-01 — End: 2021-08-05
  Administered 2021-08-01 – 2021-08-05 (×5): 100 mg via ORAL
  Filled 2021-08-01 (×5): qty 2

## 2021-08-01 MED ORDER — PROPOFOL 500 MG/50ML IV EMUL
INTRAVENOUS | Status: DC | PRN
  Administered 2021-08-01: 50 ug/kg/min via INTRAVENOUS

## 2021-08-01 MED ORDER — BUPIVACAINE-EPINEPHRINE (PF) 0.5% -1:200000 IJ SOLN
INTRAMUSCULAR | Status: AC
  Filled 2021-08-01: qty 30

## 2021-08-01 MED ORDER — TRANEXAMIC ACID 1000 MG/10ML IV SOLN
INTRAVENOUS | Status: AC
  Filled 2021-08-01: qty 10

## 2021-08-01 MED ORDER — METOCLOPRAMIDE HCL 10 MG PO TABS: 5.0000 mg | ORAL_TABLET | Freq: Three times a day (TID) | ORAL | Status: DC | PRN

## 2021-08-01 MED ORDER — SODIUM CHLORIDE 0.9 % IR SOLN
Status: DC | PRN
  Administered 2021-08-01: 3000 mL

## 2021-08-01 MED ORDER — PHENYLEPHRINE HCL-NACL 20-0.9 MG/250ML-% IV SOLN
INTRAVENOUS | Status: DC | PRN
Start: 2021-08-01 — End: 2021-08-01
  Administered 2021-08-01: 25 ug/min via INTRAVENOUS

## 2021-08-01 MED ORDER — KETOROLAC TROMETHAMINE 15 MG/ML IJ SOLN
7.5000 mg | Freq: Four times a day (QID) | INTRAMUSCULAR | Status: AC
  Administered 2021-08-02 (×2): 7.5 mg via INTRAVENOUS
  Filled 2021-08-01 (×2): qty 1

## 2021-08-01 MED ORDER — FAMOTIDINE 20 MG PO TABS
ORAL_TABLET | ORAL | Status: AC
  Administered 2021-08-01: 20 mg via ORAL
  Filled 2021-08-01: qty 1

## 2021-08-01 MED ORDER — OXYCODONE HCL 5 MG PO TABS
5.0000 mg | ORAL_TABLET | ORAL | Status: DC | PRN
  Administered 2021-08-01 (×2): 10 mg via ORAL
  Filled 2021-08-01: qty 2

## 2021-08-01 MED ORDER — GABAPENTIN 100 MG PO CAPS: 100.0000 mg | ORAL_CAPSULE | Freq: Every evening | ORAL | Status: DC | PRN

## 2021-08-01 MED ORDER — ONDANSETRON HCL 4 MG PO TABS: 4.0000 mg | ORAL_TABLET | Freq: Four times a day (QID) | ORAL | Status: DC | PRN

## 2021-08-01 MED ORDER — CEFAZOLIN SODIUM-DEXTROSE 2-4 GM/100ML-% IV SOLN
2.0000 g | INTRAVENOUS | Status: AC
  Administered 2021-08-01: 2 g via INTRAVENOUS

## 2021-08-01 MED ORDER — DOCUSATE SODIUM 100 MG PO CAPS
100.0000 mg | ORAL_CAPSULE | Freq: Two times a day (BID) | ORAL | Status: DC
  Administered 2021-08-01 – 2021-08-05 (×8): 100 mg via ORAL
  Filled 2021-08-01 (×8): qty 1

## 2021-08-01 MED ORDER — ACETAMINOPHEN 10 MG/ML IV SOLN
INTRAVENOUS | Status: AC
  Filled 2021-08-01: qty 100

## 2021-08-01 MED ORDER — OXYCODONE HCL 5 MG PO TABS: 5.0000 mg | ORAL_TABLET | Freq: Once | ORAL | Status: AC | PRN

## 2021-08-01 MED ORDER — KETOROLAC TROMETHAMINE 15 MG/ML IJ SOLN
INTRAMUSCULAR | Status: AC
  Administered 2021-08-01: 15 mg via INTRAVENOUS
  Filled 2021-08-01: qty 1

## 2021-08-01 MED ORDER — SODIUM CHLORIDE (PF) 0.9 % IJ SOLN
INTRAMUSCULAR | Status: DC | PRN
  Administered 2021-08-01: 90 mL

## 2021-08-01 MED ORDER — PHENYLEPHRINE HCL (PRESSORS) 10 MG/ML IV SOLN
INTRAVENOUS | Status: AC
  Filled 2021-08-01: qty 1

## 2021-08-01 MED ORDER — PROPOFOL 1000 MG/100ML IV EMUL
INTRAVENOUS | Status: AC
  Filled 2021-08-01: qty 100

## 2021-08-01 MED ORDER — FENTANYL CITRATE (PF) 100 MCG/2ML IJ SOLN
INTRAMUSCULAR | Status: DC | PRN
  Administered 2021-08-01 (×4): 25 ug via INTRAVENOUS

## 2021-08-01 MED ORDER — CHLORHEXIDINE GLUCONATE 0.12 % MT SOLN
OROMUCOSAL | Status: AC
  Administered 2021-08-01: 15 mL via OROMUCOSAL
  Filled 2021-08-01: qty 15

## 2021-08-01 MED ORDER — LORAZEPAM 0.5 MG PO TABS
0.5000 mg | ORAL_TABLET | Freq: Three times a day (TID) | ORAL | Status: DC | PRN
  Administered 2021-08-02: 0.5 mg via ORAL
  Filled 2021-08-01: qty 1

## 2021-08-01 MED ORDER — FLUTICASONE FUROATE-VILANTEROL 100-25 MCG/ACT IN AEPB
1.0000 | INHALATION_SPRAY | Freq: Every day | RESPIRATORY_TRACT | Status: DC
  Administered 2021-08-02 – 2021-08-05 (×4): 1 via RESPIRATORY_TRACT
  Filled 2021-08-01: qty 28

## 2021-08-01 MED ORDER — OXYCODONE HCL 5 MG PO TABS
ORAL_TABLET | ORAL | Status: AC
  Filled 2021-08-01: qty 2

## 2021-08-01 MED ORDER — ACETAMINOPHEN 10 MG/ML IV SOLN: 1000.0000 mg | Freq: Once | INTRAVENOUS | Status: DC | PRN

## 2021-08-01 MED ORDER — CHLORHEXIDINE GLUCONATE 0.12 % MT SOLN: 15.0000 mL | Freq: Once | OROMUCOSAL | Status: AC

## 2021-08-01 MED ORDER — 0.9 % SODIUM CHLORIDE (POUR BTL) OPTIME
TOPICAL | Status: DC | PRN
  Administered 2021-08-01: 500 mL

## 2021-08-01 MED ORDER — ACETAMINOPHEN 500 MG PO TABS
1000.0000 mg | ORAL_TABLET | Freq: Four times a day (QID) | ORAL | Status: AC
  Administered 2021-08-02 (×2): 1000 mg via ORAL
  Filled 2021-08-01 (×3): qty 2

## 2021-08-01 MED ORDER — SODIUM CHLORIDE FLUSH 0.9 % IV SOLN
INTRAVENOUS | Status: AC
  Filled 2021-08-01: qty 40

## 2021-08-01 MED ORDER — STERILE WATER FOR IRRIGATION IR SOLN
Status: DC | PRN
  Administered 2021-08-01: 1000 mL

## 2021-08-01 MED ORDER — ATORVASTATIN CALCIUM 20 MG PO TABS
40.0000 mg | ORAL_TABLET | Freq: Every day | ORAL | Status: DC
  Administered 2021-08-02 – 2021-08-05 (×4): 40 mg via ORAL
  Filled 2021-08-01 (×4): qty 2

## 2021-08-01 MED ORDER — OXYCODONE HCL 5 MG/5ML PO SOLN: 5.0000 mg | Freq: Once | ORAL | Status: AC | PRN

## 2021-08-01 MED ORDER — METOCLOPRAMIDE HCL 5 MG/ML IJ SOLN: 5.0000 mg | Freq: Three times a day (TID) | INTRAMUSCULAR | Status: DC | PRN

## 2021-08-01 MED ORDER — ORAL CARE MOUTH RINSE: 15.0000 mL | Freq: Once | OROMUCOSAL | Status: AC

## 2021-08-01 MED ORDER — BUPIVACAINE LIPOSOME 1.3 % IJ SUSP
INTRAMUSCULAR | Status: AC
  Filled 2021-08-01: qty 20

## 2021-08-01 MED ORDER — UMECLIDINIUM BROMIDE 62.5 MCG/ACT IN AEPB
1.0000 | INHALATION_SPRAY | Freq: Every day | RESPIRATORY_TRACT | Status: DC
  Administered 2021-08-02 – 2021-08-05 (×4): 1 via RESPIRATORY_TRACT
  Filled 2021-08-01: qty 7

## 2021-08-01 MED ORDER — CEFAZOLIN SODIUM-DEXTROSE 2-4 GM/100ML-% IV SOLN
INTRAVENOUS | Status: AC
  Filled 2021-08-01: qty 100

## 2021-08-01 MED ORDER — PHENYLEPHRINE HCL (PRESSORS) 10 MG/ML IV SOLN
INTRAVENOUS | Status: DC | PRN
  Administered 2021-08-01: 80 ug via INTRAVENOUS
  Administered 2021-08-01: 160 ug via INTRAVENOUS
  Administered 2021-08-01: 80 ug via INTRAVENOUS
  Administered 2021-08-01 (×2): 160 ug via INTRAVENOUS

## 2021-08-01 MED ORDER — FAMOTIDINE 20 MG PO TABS: 20.0000 mg | ORAL_TABLET | Freq: Once | ORAL | Status: AC

## 2021-08-01 MED ORDER — KETOROLAC TROMETHAMINE 15 MG/ML IJ SOLN: 15.0000 mg | Freq: Once | INTRAMUSCULAR | Status: AC

## 2021-08-01 MED ORDER — ALBUTEROL SULFATE (2.5 MG/3ML) 0.083% IN NEBU: 3.0000 mL | INHALATION_SOLUTION | Freq: Four times a day (QID) | RESPIRATORY_TRACT | Status: DC | PRN

## 2021-08-01 MED ORDER — ACETAMINOPHEN 10 MG/ML IV SOLN
INTRAVENOUS | Status: DC | PRN
  Administered 2021-08-01: 1000 mg via INTRAVENOUS

## 2021-08-01 MED ORDER — FLEET ENEMA 7-19 GM/118ML RE ENEM: 1.0000 | ENEMA | Freq: Once | RECTAL | Status: DC | PRN

## 2021-08-01 MED ORDER — BUPIVACAINE HCL (PF) 0.5 % IJ SOLN
INTRAMUSCULAR | Status: DC | PRN
  Administered 2021-08-01: 2.5 mL via INTRATHECAL

## 2021-08-01 MED ORDER — TRANEXAMIC ACID 1000 MG/10ML IV SOLN
INTRAVENOUS | Status: DC | PRN
  Administered 2021-08-01: 1000 mg via TOPICAL

## 2021-08-01 MED ORDER — LACTATED RINGERS IV SOLN: INTRAVENOUS | Status: DC

## 2021-08-01 MED ORDER — PROPOFOL 10 MG/ML IV BOLUS
INTRAVENOUS | Status: DC | PRN
Start: 2021-08-01 — End: 2021-08-01
  Administered 2021-08-01 (×2): 30 mg via INTRAVENOUS

## 2021-08-01 MED ORDER — APIXABAN 2.5 MG PO TABS
2.5000 mg | ORAL_TABLET | Freq: Two times a day (BID) | ORAL | Status: DC
  Administered 2021-08-02 – 2021-08-05 (×7): 2.5 mg via ORAL
  Filled 2021-08-01 (×7): qty 1

## 2021-08-01 MED ORDER — POLYSACCHARIDE IRON COMPLEX 150 MG PO CAPS
150.0000 mg | ORAL_CAPSULE | Freq: Every day | ORAL | Status: DC
Start: 2021-08-01 — End: 2021-08-05
  Administered 2021-08-01 – 2021-08-05 (×4): 150 mg via ORAL
  Filled 2021-08-01 (×5): qty 1

## 2021-08-01 MED ORDER — FENTANYL CITRATE (PF) 100 MCG/2ML IJ SOLN
INTRAMUSCULAR | Status: AC
  Administered 2021-08-01: 25 ug via INTRAVENOUS
  Filled 2021-08-01: qty 2

## 2021-08-01 MED ORDER — BISACODYL 10 MG RE SUPP: 10.0000 mg | Freq: Every day | RECTAL | Status: DC | PRN

## 2021-08-01 MED ORDER — AMLODIPINE BESYLATE 10 MG PO TABS
10.0000 mg | ORAL_TABLET | Freq: Every day | ORAL | Status: DC
  Administered 2021-08-02 – 2021-08-05 (×4): 10 mg via ORAL
  Filled 2021-08-01 (×4): qty 1

## 2021-08-01 MED ORDER — FENTANYL CITRATE (PF) 100 MCG/2ML IJ SOLN
25.0000 ug | INTRAMUSCULAR | Status: DC | PRN
  Administered 2021-08-01 (×3): 25 ug via INTRAVENOUS

## 2021-08-01 MED ORDER — CEFAZOLIN SODIUM-DEXTROSE 2-4 GM/100ML-% IV SOLN
INTRAVENOUS | Status: AC
  Administered 2021-08-01: 2 g via INTRAVENOUS
  Filled 2021-08-01: qty 100

## 2021-08-01 MED ORDER — SODIUM CHLORIDE 0.9 % IV SOLN: INTRAVENOUS | Status: DC

## 2021-08-01 MED ORDER — CEFAZOLIN SODIUM-DEXTROSE 2-4 GM/100ML-% IV SOLN
2.0000 g | Freq: Four times a day (QID) | INTRAVENOUS | Status: AC
  Administered 2021-08-01 – 2021-08-02 (×2): 2 g via INTRAVENOUS
  Filled 2021-08-01 (×2): qty 100

## 2021-08-01 MED ORDER — ONDANSETRON HCL 4 MG/2ML IJ SOLN: 4.0000 mg | Freq: Once | INTRAMUSCULAR | Status: DC | PRN

## 2021-08-01 SURGICAL SUPPLY — 60 items
BIT DRILL JC 5IN 2.4M 127 24FL (BIT) ×1 IMPLANT
BLADE SAGITTAL WIDE XTHICK NO (BLADE) ×2 IMPLANT
BLADE SURG SZ20 CARB STEEL (BLADE) ×2 IMPLANT
CHLORAPREP W/TINT 26 (MISCELLANEOUS) ×2 IMPLANT
DRAPE 3/4 80X56 (DRAPES) ×2 IMPLANT
DRAPE IMP U-DRAPE 54X76 (DRAPES) IMPLANT
DRAPE INCISE IOBAN 66X60 STRL (DRAPES) ×2 IMPLANT
DRAPE SURG 17X11 SM STRL (DRAPES) ×4 IMPLANT
DRSG MEPILEX SACRM 8.7X9.8 (GAUZE/BANDAGES/DRESSINGS) ×2 IMPLANT
DRSG OPSITE POSTOP 4X10 (GAUZE/BANDAGES/DRESSINGS) ×2 IMPLANT
ELECT CAUTERY BLADE 6.4 (BLADE) ×2 IMPLANT
GAUZE 4X4 16PLY ~~LOC~~+RFID DBL (SPONGE) ×2 IMPLANT
GAUZE XEROFORM 1X8 LF (GAUZE/BANDAGES/DRESSINGS) ×2 IMPLANT
GLOVE SRG 8 PF TXTR STRL LF DI (GLOVE) ×1 IMPLANT
GLOVE SURG ENC MOIS LTX SZ7.5 (GLOVE) ×8 IMPLANT
GLOVE SURG ENC MOIS LTX SZ8 (GLOVE) ×8 IMPLANT
GLOVE SURG UNDER LTX SZ8 (GLOVE) ×2 IMPLANT
GLOVE SURG UNDER POLY LF SZ8 (GLOVE) ×1
GOWN STRL REUS W/ TWL LRG LVL3 (GOWN DISPOSABLE) ×1 IMPLANT
GOWN STRL REUS W/ TWL XL LVL3 (GOWN DISPOSABLE) ×1 IMPLANT
GOWN STRL REUS W/TWL LRG LVL3 (GOWN DISPOSABLE) ×1
GOWN STRL REUS W/TWL XL LVL3 (GOWN DISPOSABLE) ×1
HEAD CERAMIC BIOLOX 36 (Head) ×1 IMPLANT
HIP SHELL ACETAB 3H 56MM F (Shell) ×2 IMPLANT
HOLSTER ELECTROSUGICAL PENCIL (MISCELLANEOUS) ×2 IMPLANT
IV NS IRRIG 3000ML ARTHROMATIC (IV SOLUTION) ×2 IMPLANT
KIT TURNOVER KIT A (KITS) ×2 IMPLANT
LINER ACE G7 36 SZF HIGH WALL (Liner) ×1 IMPLANT
MANIFOLD NEPTUNE II (INSTRUMENTS) ×2 IMPLANT
MAT ABSORB  FLUID 56X50 GRAY (MISCELLANEOUS) ×1
MAT ABSORB FLUID 56X50 GRAY (MISCELLANEOUS) ×1 IMPLANT
NDL FILTER BLUNT 18X1 1/2 (NEEDLE) ×1 IMPLANT
NDL SAFETY ECLIPSE 18X1.5 (NEEDLE) ×2 IMPLANT
NDL SPNL 20GX3.5 QUINCKE YW (NEEDLE) ×1 IMPLANT
NEEDLE FILTER BLUNT 18X 1/2SAF (NEEDLE) ×1
NEEDLE FILTER BLUNT 18X1 1/2 (NEEDLE) ×1 IMPLANT
NEEDLE HYPO 18GX1.5 SHARP (NEEDLE) ×2
NEEDLE SPNL 20GX3.5 QUINCKE YW (NEEDLE) ×2 IMPLANT
PACK HIP PROSTHESIS (MISCELLANEOUS) ×2 IMPLANT
PENCIL SMOKE EVACUATOR (MISCELLANEOUS) ×2 IMPLANT
PIN STEIN SMOOTH 3/16X9 (Pin) IMPLANT
PIN STEIN SMOOTH 4.8X9 (Pin) ×2 IMPLANT
PIN STEINMAN 3/16 (PIN) ×2 IMPLANT
PULSAVAC PLUS IRRIG FAN TIP (DISPOSABLE) ×2
SHELL ACETAB HIP 3H 56MM F (Shell) IMPLANT
SPONGE T-LAP 18X18 ~~LOC~~+RFID (SPONGE) ×10 IMPLANT
STAPLER SKIN PROX 35W (STAPLE) ×2 IMPLANT
STEM FEMORAL FULL 15X155 ECHO (Stem) ×1 IMPLANT
SUT TICRON 2-0 30IN 311381 (SUTURE) ×7 IMPLANT
SUT VIC AB 0 CT1 36 (SUTURE) ×2 IMPLANT
SUT VIC AB 1 CT1 36 (SUTURE) ×2 IMPLANT
SUT VIC AB 2-0 CT1 (SUTURE) ×6 IMPLANT
SYR 10ML LL (SYRINGE) ×2 IMPLANT
SYR 20ML LL LF (SYRINGE) ×2 IMPLANT
SYR 30ML LL (SYRINGE) ×4 IMPLANT
TAPE TRANSPORE STRL 2 31045 (GAUZE/BANDAGES/DRESSINGS) ×2 IMPLANT
TIP FAN IRRIG PULSAVAC PLUS (DISPOSABLE) ×1 IMPLANT
TRAP FLUID SMOKE EVACUATOR (MISCELLANEOUS) ×2 IMPLANT
WATER STERILE IRR 1000ML POUR (IV SOLUTION) ×2 IMPLANT
WATER STERILE IRR 500ML POUR (IV SOLUTION) ×2 IMPLANT

## 2021-08-01 NOTE — H&P (Signed)
History of Present Illness: Ruben Green is a 88 y.o. male who presents for evaluation and treatment of his right hip and groin pain with radiation to down the anterior aspect of his leg to just below his knee. The symptoms have been present for 5-6 months and developed without any apparent cause or injury. The pain is severe, constant and 10 on a scale of 1-10. He describes the pain as aching, stabbing and throbbing. The symptoms are aggravated by standing, walking, daily activities and at night. The symptoms improve with sitting, lying down and rest. He has tried Tylenol, tramadol, and BC powders, all with limited benefit. He denies any numbness, weakness, paresthesias to either lower extremity. He reports no bladder, bowel or sexual dysfunction. However, he does have a history of prostate cancer for which he is undergone surgery and radiation therapy, and does have occasional episodes of blood in his urine. He has tried one steroid injection by Dr. Candelaria Stagers about 7 weeks ago which provided little if any relief of his symptoms. He has not had any prior injury or symptoms to this part of the body in the past. 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  Current Outpatient Medications:  amLODIPine (NORVASC) 10 MG tablet TAKE 1 TABLET BY MOUTH DAILY 90 tablet 1   aspirin 81 MG EC tablet Take 1 tablet (81 mg total) by mouth as needed   atorvastatin (LIPITOR) 40 MG tablet TAKE 1 TABLET BY MOUTH DAILY 90 tablet 1   finasteride (PROSCAR) 5 mg tablet Take 5 mg by mouth once daily.   fluticasone-umeclidinium-vilanterol (TRELEGY ELLIPTA) 100-62.5-25 mcg inhaler Inhale 1 inhalation into the lungs once daily 60 each 5   FUROsemide (LASIX) 20 MG tablet Take 1 tablet (20 mg total) by mouth once daily as needed for Edema 30 tablet 5   iron polysaccharides (IRON POLYSACCHARIDES) 150 mg iron capsule Take 1 capsule (150 mg total) by mouth once daily 90 capsule 1   LORazepam  (ATIVAN) 0.5 MG tablet TAKE 1 TABLET BY MOUTH EVERY DAY AS NEEDED FOR ANXIETY 30 tablet 2   losartan (COZAAR) 100 MG tablet TAKE 1 TABLET BY MOUTH DAILY 90 tablet 1   tamsulosin (FLOMAX) 0.4 mg capsule Take 1 capsule (0.4 mg total) by mouth once daily Take 30 minutes after same meal each day. 30 capsule 5   Allergies: No Known Allergies  Past Medical History:   BPH (benign prostatic hypertrophy)   COPD (chronic obstructive pulmonary disease) (CMS-HCC)   COVID-19 12/2020   Hyperlipidemia   Hypertension   Past Surgical History:   ABDOMINAL AORTIC ANEURYSM REPAIR 2005   RIGHT CAROTID ENDARTERECTOMY 2011   INGUINAL HERNIA REPAIR Right 12/02/2015 (Dr Rochel Brome)   CATARACT EXTRACTION Bilateral 2018   COLONOSCOPY 03/31/2019 (Tubular adenoma of the colon/Repeat Flex Sig in 6 months/MUS)   COLONOSCOPY 09/09/2020 (Tubular adenomas/PHx CP/Repeat as needed due to radiation proctitis/CTL)   HERNIA REPAIR Bilateral x2 Inguinal (Dr Rochel Brome)   Family History:   Coronary Artery Disease (Blocked arteries around heart) Mother   Social History:  Socioeconomic History:   Marital status: Married  Occupational History   Occupation: retired  Tobacco Use   Smoking status: Former  Types: Cigarettes  Quit date: 06/18/2009  Years since quitting: 12.0   Smokeless tobacco: Never  Substance and Sexual Activity   Alcohol use: Not Currently  Alcohol/week: 0.0 standard drinks  Comment: Occasional   Review of Systems:  A comprehensive 14 point ROS  was performed, reviewed, and the pertinent orthopaedic findings are documented in the HPI.  Physical Exam: Vitals:  BP: 126/88  Weight: 79.7 kg (175 lb 12.8 oz)  Height: 157.5 cm (5\' 2" )  PainSc: 10-Worst pain ever  PainLoc: Hip   General/Constitutional: The patient appears to be well-nourished, well-developed, and in no acute distress. Neuro/Psych: Normal mood and affect, oriented to person, place and time. Eyes: Non-icteric. Pupils are equal,  round, and reactive to light, and exhibit synchronous movement. ENT: Unremarkable. Lymphatic: No palpable adenopathy. Respiratory: Lungs clear to auscultation, Normal chest excursion, No wheezes and Non-labored breathing Cardiovascular: Regular rate and rhythm. No murmurs. and No edema, swelling or tenderness, except as noted in detailed exam. Integumentary: No impressive skin lesions present, except as noted in detailed exam. Musculoskeletal: Unremarkable, except as noted in detailed exam.  Lumbar exam: Skin inspection of the lower back is unremarkable. In stance, his spine is straight and his pelvis is level. He can arise from a seated position with some difficulty, and demonstrates an antalgic gait. However, he does not ambulate with any assistive devices. He has no tenderness along the thoracic or lumbar spine, nor across the sacrum. He has no pain over Either trochanteric regions, either sciatic notch region, or either SI joint. He can heel raise and toe raise without difficulty or weakness.   Right hip exam: He has significant pain with right hip range of motion. He exhibits flexion to 90 degrees, internal rotation to 5 degrees, and external rotation to 10 degrees on the right. He is neurovascularly intact to both lower extremities. He has negative sitting straight leg raises bilaterally.  X-rays/MRI/Lab data:  X-rays of the pelvis and right hip are available for review and have been reviewed by myself. These films demonstrate advanced degenerative joint disease of the right hip with joint space narrowing, osteophyte formation, and subchondral cystic changes. No fractures or lytic lesions are identified.  Assessment:  Primary osteoarthritis of right hip   Plan: The treatment options were discussed with the patient and his wife who has been helping with her interpretive skills. In addition, patient educational materials were provided regarding the diagnosis and treatment options. The  patient is quite frustrated by his symptoms and functional limitations, and is ready to consider more aggressive treatment options. Therefore, I have recommended a surgical procedure, specifically a right total of arthroplasty. The procedure was discussed with the patient, as were the potential risks (including bleeding, infection, nerve and/or blood vessel injury, persistent or recurrent pain, loosening and/or failure of the components, dislocation, leg length inequality, need for further surgery, blood clots, strokes, heart attacks and/or arhythmias, pneumonia, etc.) and benefits. The patient states his/her understanding and wishes to proceed. All of the patient's questions and concerns were answered. He can call any time with further concerns. He will return to work without restrictions. He will follow up post-surgery, routine.   H&P reviewed and patient re-examined. No changes.

## 2021-08-01 NOTE — Anesthesia Preprocedure Evaluation (Signed)
Anesthesia Evaluation  Patient identified by MRN, date of birth, ID band Patient awake    Reviewed: Allergy & Precautions, NPO status , Patient's Chart, lab work & pertinent test results  History of Anesthesia Complications Negative for: history of anesthetic complications  Airway Mallampati: III  TM Distance: >3 FB Neck ROM: Full    Dental  (+) Poor Dentition, Chipped   Pulmonary neg sleep apnea, COPD,  COPD inhaler, Patient abstained from smoking.Not current smoker, former smoker,    Pulmonary exam normal breath sounds clear to auscultation       Cardiovascular Exercise Tolerance: Good METShypertension, + angina + CAD  (-) Past MI (-) dysrhythmias  Rhythm:Regular Rate:Normal - Systolic murmurs    Neuro/Psych PSYCHIATRIC DISORDERS Anxiety negative neurological ROS     GI/Hepatic neg GERD  ,(+)     (-) substance abuse  ,   Endo/Other  neg diabetes  Renal/GU negative Renal ROS     Musculoskeletal   Abdominal   Peds  Hematology  (+) Blood dyscrasia, anemia , No bleeding diatheses   Anesthesia Other Findings Past Medical History: No date: AAA (abdominal aortic aneurysm)     Comment:  a.) s/p repair in 2005 No date: Anemia No date: Anginal pain ([place]) No date: Anxiety No date: Arthritis No date: B12 deficiency No date: Bilateral cataracts     Comment:  a.) s/p BILATERAL extractions in 2018 No date: BPH with obstruction/lower urinary tract symptoms No date: CAD (coronary artery disease) No date: Carotid artery stenosis     Comment:  a.) s/p CEA on the RIGHT No date: COPD (chronic obstructive pulmonary disease) ([place]) 22/29/7989: Diastolic dysfunction     Comment:  a.)  TTE 07/29/2019: EF 50-55%; LA mildly enlarged;               G1DD. No date: DOE (dyspnea on exertion) No date: Elevated PSA No date: Environmental and seasonal allergies No date: History of 2019 novel coronavirus disease (COVID-19) No date:  History of kidney stones No date: HLD (hyperlipidemia) No date: HOH (hard of hearing) No date: HTN (hypertension) No date: Incomplete bladder emptying No date: Macular degeneration No date: Prostate cancer ([place]) No date: RBBB (right bundle branch block) No date: Valvular insufficiency     Comment:  a.) TTE 07/29/2019: LVEF 50-55%; LA mild enlarged; triv               AR/PR, mild MR, mod TR.  Reproductive/Obstetrics                             Anesthesia Physical Anesthesia Plan  ASA: 2  Anesthesia Plan: Spinal   Post-op Pain Management: Ofirmev IV (intra-op)*   Induction: Intravenous  PONV Risk Score and Plan: 1 and Ondansetron, Dexamethasone, Propofol infusion, TIVA and Treatment may vary due to age or medical condition  Airway Management Planned: Natural Airway  Additional Equipment: None  Intra-op Plan:   Post-operative Plan:   Informed Consent: I have reviewed the patients History and Physical, chart, labs and discussed the procedure including the risks, benefits and alternatives for the proposed anesthesia with the patient or authorized representative who has indicated his/her understanding and acceptance.       Plan Discussed with: CRNA and Surgeon  Anesthesia Plan Comments: (Patient very hard of hearing, and also speaks mostly [place]. Patient declined use of interpreter and instead insisted on his wife at bedside providing translation.  Discussed R/B/A of neuraxial anesthesia technique with  patient: - rare risks of spinal/epidural hematoma, nerve damage, infection - Risk of PDPH - Risk of nausea and vomiting - Risk of conversion to general anesthesia and its associated risks, including sore throat, damage to lips/eyes/teeth/oropharynx, and rare risks such as cardiac and respiratory events. - Risk of allergic reactions  Discussed the role of CRNA in patient's perioperative care.  Patient voiced understanding.)        Anesthesia  Quick Evaluation

## 2021-08-01 NOTE — Progress Notes (Signed)
Bladder scan preformed. 867mL noted in bladder. Dr. Roland Rack notified. Acknowledged. Orders received.

## 2021-08-01 NOTE — Op Note (Signed)
08/01/2021  9:58 AM  Patient:   Ruben Green  Pre-Op Diagnosis:   Degenerative joint disease, right hip.  Post-Op Diagnosis:   Same.  Procedure:   Right total hip arthroplasty.  Surgeon:   Pascal Lux, MD  Assistant:   Cameron Proud, PA-C  Anesthesia:   Spinal  Findings:   As above.  Complications:   None  EBL:   100 cc  Fluids:   700 cc crystalloid  UOP:   None  TT:   None  Drains:   None  Closure:   Staples  Implants:   Biomet press-fit system with a #15 laterally offset Echo femoral stem, a 56 mm acetabular shell with an E-poly hi-wall liner, and a 36 mm ceramic head with a -3 mm neck.  Brief Clinical Note:   The patient is an 88 year old male with a 6+ month history of progressively worsening right hip/groin pain. His symptoms have progressed despite medications, activity modification, etc. His history and examination consistent with advanced degenerative joint disease, confirmed by plain radiographs. The patient presents at this time for a right total hip arthroplasty.   Procedure:   The patient was brought into the operating room. After adequate spinal anesthesia was obtained, the patient was repositioned in the left lateral decubitus position and secured using a lateral hip positioner. The right hip and lower extremity were prepped with ChloroPrep solution before being draped sterilely. Preoperative antibiotics were administered. A timeout was performed to verify the appropriate surgical site.    A standard posterior approach to the hip was made through an approximately 4-5 inch incision. The incision was carried down through the subcutaneous tissues to expose the gluteal fascia and proximal end of the iliotibial band. These structures were split the length of the incision and the Charnley self-retaining hip retractor placed. The bursal tissues were swept posteriorly to expose the short external rotators. The anterior border of the piriformis tendon was  identified and this plane developed down through the capsule to enter the joint. A flap of tissue was elevated off the posterior aspect of the femoral neck and greater trochanter and retracted posteriorly. This flap included the piriformis tendon, the short external rotators, and the posterior capsule. The soft tissues were elevated off the lateral aspect of the ilium and a large Steinmann pin placed bicortically.   With the right leg aligned over the left, a drill bit was placed into the greater trochanter parallel to the Steinmann pin and the distance between these two pins measured in order to optimize leg lengths postoperatively. The drill bit was removed and the hip dislocated. The piriformis fossa was debrided of soft tissues before the intramedullary canal was accessed through this point using a triple step reamer. The canal was reamed sequentially beginning with a #7 tapered reamer and progressing to a #15 tapered reamer. This provided excellent circumferential chatter. Using the appropriate guide, a femoral neck cut was made 10-12 mm above the lesser trochanter. The femoral head was removed.  Attention was directed to the acetabular side. The labrum was debrided circumferentially before the ligamentum teres was removed using a large curette. A line was drawn on the drapes corresponding to the native version of the acetabulum. This line was used as a guide while the acetabulum was reamed sequentially beginning with a 49 mm reamer and progressing to a 55 mm reamer. This provided excellent circumferential chatter. The 55 mm trial acetabulum was positioned and found to fit quite well. Therefore, the 56 mm  acetabular shell was selected and impacted into place with care taken to maintain the appropriate version. The trial high wall liner was inserted.  Attention was redirected to the femoral side. A box osteotome was used to establish version before the canal was broached sequentially beginning with a #7  broach and progressing to a #15 broach. This was left in place and several trial reductions performed using both a standard and laterally offset neck options, as well as the -6 mm, -3 mm, and +0 mm neck lengths. After removing the trial components, the "manhole cover" was placed into the apex of the acetabular shell and tightened securely. The permanent E-polyethylene hi-wall liner was impacted into the acetabular shell and its locking mechanism verified using a quarter-inch osteotome. Next, the #15 laterally offset femoral stem was impacted into place with care taken to maintain the appropriate version. A repeat trial reduction was performed using the -3 mm and +0 mm neck lengths. The -3 mm neck length demonstrated excellent stability both in extension and external rotation as well as with flexion to 90 and internal rotation beyond 70. It also was stable in the position of sleep. In addition, leg lengths appeared to be restored appropriately, both by reassessing the position of the right leg over the left, as well as by measuring the distance between the Steinmann pin and the drill bit. The 36 mm ceramic head with the -3 mm neck was impacted onto the stem of the femoral component. The Morse taper locking mechanism was verified using manual distraction before the head was relocated and placed through a range of motion with the findings as described above.  The wound was copiously irrigated with sterile saline solution via the jet lavage system before the peri-incisional and pericapsular tissues were injected with 30 cc of 0.5% Sensorcaine with epinephrine and 20 cc of Exparel diluted out to 60 cc with normal saline to help with postoperative analgesia. The posterior flap was reapproximated to the posterior aspect of the greater trochanter using #2 Tycron interrupted sutures placed through drill holes. Several additional #2 Tycron interrupted sutures were used to reinforce this layer of closure. The iliotibial  band was reapproximated using #1 Vicryl interrupted sutures before the gluteal fascia was closed using a running #1 Vicryl suture. At this point, 1 g of transexemic acid in 10 cc of normal saline was injected into the joint to help reduce postoperative bleeding. The subcutaneous tissues were closed in several layers using 2-0 Vicryl interrupted sutures before the skin was closed using staples. A sterile occlusive dressing was applied to the wound. The patient was then rolled back into the supine position on his/her hospital bed before being awakened and returned to the recovery room in satisfactory condition after tolerating the procedure well.

## 2021-08-01 NOTE — Transfer of Care (Signed)
Immediate Anesthesia Transfer of Care Note  Patient: Ruben Green  Procedure(s) Performed: TOTAL HIP ARTHROPLASTY (Right: Hip)  Patient Location: PACU  Anesthesia Type:Spinal  Level of Consciousness: awake, alert  and oriented  Airway & Oxygen Therapy: Patient Spontanous Breathing and Patient connected to face mask oxygen  Post-op Assessment: Report given to RN and Post -op Vital signs reviewed and stable  Post vital signs: Reviewed and stable  Last Vitals:  Vitals Value Taken Time  BP 126/61 08/01/21 0947  Temp    Pulse 80 08/01/21 0951  Resp 18 08/01/21 0951  SpO2 100 % 08/01/21 0951  Vitals shown include unvalidated device data.  Last Pain:  Vitals:   08/01/21 0628  TempSrc: Temporal  PainSc: 3          Complications: No notable events documented.

## 2021-08-01 NOTE — Progress Notes (Signed)
Informed pt's son, Iona Beard, pt will be going to room 143

## 2021-08-01 NOTE — Evaluation (Signed)
Physical Therapy Evaluation Patient Details Name: Ruben Green MRN: [id number] DOB: November 17, 1933 Today's Date: 08/01/2021  History of Present Illness  Pt admitted for R THR and is POD 0 at time of evaluation. History includes prostate cancer, COPD, HLD, HTN.  Clinical Impression  Pt is a pleasant 88 year old male who was admitted for R THR. Pt performs bed mobility with min assist, transfers with cga, and ambulation with cga and RW. Pt demonstrates deficits with strength/mobility/pain. Educated on hip precautions, needs assist for recall. Pt is very HOH and difficult to understand. Would benefit at next session to have family at bedside for education. Would benefit from skilled PT to address above deficits and promote optimal return to PLOF. Recommend transition to [facility] upon discharge from acute hospitalization.      Recommendations for follow up therapy are one component of a multi-disciplinary discharge planning process, led by the attending physician.  Recommendations may be updated based on patient status, additional functional criteria and insurance authorization.  Follow Up Recommendations Home health PT    Assistance Recommended at Discharge Frequent or constant Supervision/Assistance  Patient can return home with the following  A little help with walking and/or transfers;A little help with bathing/dressing/bathroom;Assist for transportation;Help with stairs or ramp for entrance    Equipment Recommendations Rolling walker (2 wheels);BSC/3in1  Recommendations for Other Services       Functional Status Assessment Patient has had a recent decline in their functional status and demonstrates the ability to make significant improvements in function in a reasonable and predictable amount of time.     Precautions / Restrictions Precautions Precautions: Posterior Hip;Fall Precaution Booklet Issued: No Restrictions Weight Bearing Restrictions: Yes RLE Weight Bearing: Weight bearing  as tolerated      Mobility  Bed Mobility Overal bed mobility: Needs Assistance Bed Mobility: Supine to Sit     Supine to sit: Min assist     General bed mobility comments: needs assist for maintaining hip precautions. Once seated, able to demonstrate upright posutre    Transfers Overall transfer level: Needs assistance Equipment used: Rolling walker (2 wheels) Transfers: Sit to/from Stand Sit to Stand: Min guard           General transfer comment: upright posture. Appears to be in little pain with WBing, Cues for AD.    Ambulation/Gait Ambulation/Gait assistance: Min guard Gait Distance (Feet): 6 Feet Assistive device: Rolling walker (2 wheels) Gait Pattern/deviations: Step-to pattern       General Gait Details: ambulated at side of bed. Slightly impulsive. RW used  Financial trader Rankin (Stroke Patients Only)       Balance Overall balance assessment: Needs assistance Sitting-balance support: Feet supported Sitting balance-Leahy Scale: Good     Standing balance support: Bilateral upper extremity supported Standing balance-Leahy Scale: Good                               Pertinent Vitals/Pain Pain Assessment Pain Assessment: 0-10 Pain Score: 2  Pain Location: R hip Pain Descriptors / Indicators: Operative site guarding Pain Intervention(s): Limited activity within patient's tolerance, Ice applied, Repositioned    Home Living Family/patient expects to be discharged to:: Private residence Living Arrangements: Spouse/significant other Available Help at Discharge: Family Type of Home: House Home Access: Stairs to enter Entrance Stairs-Rails: None Entrance Stairs-Number of Steps: 4  Additional Comments: pt difficult to understand due to [facility] and language barrier. Poor historian. 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 Unsure of accuracy       Hand Dominance        Extremity/Trunk Assessment   Upper Extremity Assessment Upper Extremity Assessment: Overall WFL for tasks assessed    Lower Extremity Assessment Lower Extremity Assessment: Generalized weakness (R LE grossly 3/5)       Communication   Communication: HOH;Prefers language other than English  Cognition Arousal/Alertness: Awake/alert Behavior During Therapy: WFL for tasks assessed/performed Overall Cognitive Status: Within Functional Limits for tasks assessed                                 General Comments: difficult to understand, appears oriented        General Comments      Exercises Other Exercises Other Exercises: supine ther-ex performed on R LE including AP, quad sets, heel slides, and hip abd/add. 10 reps with cga   Assessment/Plan    PT Assessment Patient needs continued PT services  PT Problem List Decreased strength;Decreased balance;Decreased mobility;Decreased safety awareness;Decreased knowledge of use of DME;Decreased knowledge of precautions;Pain       PT Treatment Interventions DME instruction;Gait training;Therapeutic exercise    PT Goals (Current goals can be found in the Care Plan section)  Acute Rehab PT Goals Patient Stated Goal: to go home PT Goal Formulation: With patient Time For Goal Achievement: 08/15/21 Potential to Achieve Goals: Good    Frequency BID     Co-evaluation               AM-PAC PT "6 Clicks" Mobility  Outcome Measure Help needed turning from your back to your side while in a flat bed without using bedrails?: A Little Help needed moving from lying on your back to sitting on the side of a flat bed without using bedrails?: A Little Help needed moving to and from a bed to a chair (including a wheelchair)?: A  Little Help needed standing up from a chair using your arms (e.g., wheelchair or bedside chair)?: A Little Help needed to walk in hospital room?: A Little Help needed climbing 3-5 steps with a railing? : A Little 6 Click Score: 18    End of Session Equipment Utilized During Treatment: Gait belt Activity Tolerance: Patient tolerated treatment well Patient left: in bed;with bed alarm set Nurse Communication: Mobility status PT Visit Diagnosis: Muscle weakness (generalized) (M62.81);Difficulty in walking, not elsewhere classified (R26.2);Pain Pain - Right/Left: Right Pain - part of body: Hip    Time: 0867-6195 PT Time Calculation (min) (ACUTE ONLY): 24 min   Charges:   PT Evaluation $PT Eval Low Complexity: 1 Low PT Treatments $Therapeutic Exercise: 8-22 mins        Greggory Stallion, PT, DPT, GCS [phone]   [place] 08/01/2021, 4:29 PM

## 2021-08-01 NOTE — TOC Progression Note (Signed)
Transition of Care [facility]) - Progression Note    Patient Details  Name: Ruben Green MRN: [id number] Date of Birth: Nov 14, 1933  Transition of Care [facility]) CM/SW [place], RN Phone Number: 08/01/2021, 3:37 PM  Clinical Narrative:   [facility] is not able to accept the patient for Home health services, PT to eval, TOC to follow for needs and assistance         Expected Discharge Plan and Services                                                 Social Determinants of Health (SDOH) Interventions    Readmission Risk Interventions No flowsheet data found.

## 2021-08-01 NOTE — Anesthesia Procedure Notes (Signed)
Spinal  Patient location during procedure: OR Start time: 08/01/2021 7:38 AM End time: 08/01/2021 7:43 AM Reason for block: surgical anesthesia Staffing Performed: resident/CRNA  Anesthesiologist: Arita Miss, MD Resident/CRNA: Aline Brochure, CRNA Preanesthetic Checklist Completed: patient identified, IV checked, site marked, risks and benefits discussed, surgical consent, monitors and equipment checked, pre-op evaluation and timeout performed Spinal Block Patient position: sitting Prep: Betadine Patient monitoring: heart rate, continuous pulse ox, blood pressure and cardiac monitor Approach: midline Location: L4-5 Injection technique: single-shot Needle Needle type: Whitacre and Introducer  Needle gauge: 24 G Needle length: 9 cm Assessment Events: CSF return Additional Notes Negative paresthesia. Negative blood return. Positive free-flowing CSF. Expiration date of kit checked and confirmed. Patient tolerated procedure well, without complications.

## 2021-08-02 ENCOUNTER — Encounter: Payer: Self-pay | Admitting: Surgery

## 2021-08-02 DIAGNOSIS — Z96641 Presence of right artificial hip joint: Secondary | ICD-10-CM | POA: Diagnosis not present

## 2021-08-02 LAB — BASIC METABOLIC PANEL
Anion gap: 8 (ref 5–15)
BUN: 24 mg/dL — ABNORMAL HIGH (ref 8–23)
CO2: 21 mmol/L — ABNORMAL LOW (ref 22–32)
Calcium: 8.2 mg/dL — ABNORMAL LOW (ref 8.9–10.3)
Chloride: 105 mmol/L (ref 98–111)
Creatinine, Ser: 1.19 mg/dL (ref 0.61–1.24)
GFR, Estimated: 60 mL/min — ABNORMAL LOW (ref >60)
Glucose, Bld: 150 mg/dL — ABNORMAL HIGH (ref 70–99)
Potassium: 4.6 mmol/L (ref 3.5–5.1)
Sodium: 134 mmol/L — ABNORMAL LOW (ref 135–145)

## 2021-08-02 LAB — CBC
HCT: 26.9 % — ABNORMAL LOW (ref 39.0–52.0)
Hemoglobin: 8.3 g/dL — ABNORMAL LOW (ref 13.0–17.0)
MCH: 28 pg (ref 26.0–34.0)
MCHC: 30.9 g/dL (ref 30.0–36.0)
MCV: 90.9 fL (ref 80.0–100.0)
Platelets: 202 10*3/uL (ref 150–400)
RBC: 2.96 MIL/uL — ABNORMAL LOW (ref 4.22–5.81)
RDW: 15.9 % — ABNORMAL HIGH (ref 11.5–15.5)
WBC: 9.1 10*3/uL (ref 4.0–10.5)
nRBC: 0 % (ref 0.0–0.2)

## 2021-08-02 LAB — SURGICAL PATHOLOGY

## 2021-08-02 MED ORDER — OXYCODONE HCL 5 MG PO TABS
2.5000 mg | ORAL_TABLET | ORAL | Status: DC | PRN
  Administered 2021-08-03: 2.5 mg via ORAL
  Filled 2021-08-02: qty 1

## 2021-08-02 MED ORDER — KETOROLAC TROMETHAMINE 15 MG/ML IJ SOLN
15.0000 mg | Freq: Four times a day (QID) | INTRAMUSCULAR | Status: AC
  Administered 2021-08-02 – 2021-08-03 (×4): 15 mg via INTRAVENOUS
  Filled 2021-08-02 (×4): qty 1

## 2021-08-02 MED ORDER — ALBUTEROL SULFATE HFA 108 (90 BASE) MCG/ACT IN AERS
2.0000 | INHALATION_SPRAY | Freq: Four times a day (QID) | RESPIRATORY_TRACT | Status: DC | PRN
  Administered 2021-08-02 – 2021-08-05 (×3): 2 via RESPIRATORY_TRACT
  Filled 2021-08-02: qty 6.7

## 2021-08-02 MED ORDER — ACETAMINOPHEN 500 MG PO TABS
1000.0000 mg | ORAL_TABLET | Freq: Four times a day (QID) | ORAL | Status: AC
  Administered 2021-08-02 – 2021-08-03 (×4): 1000 mg via ORAL
  Filled 2021-08-02 (×4): qty 2

## 2021-08-02 NOTE — Progress Notes (Signed)
Subjective: 1 Day Post-Op Procedure(s) (LRB): TOTAL HIP ARTHROPLASTY (Right) Patient reports pain as mild.   Patient is well, and has had no acute complaints or problems Wife and son are in the room with him, they help with interpretation. Plan is to go Home after hospital stay. Negative for chest pain and shortness of breath Fever: no Gastrointestinal:Negative for nausea and vomiting  Objective: Vital signs in last 24 hours: Temp:  [97.2 F (36.2 C)-98.5 F (36.9 C)] 98.2 F (36.8 C) (02/15 0750) Pulse Rate:  [79-96] 91 (02/15 0750) Resp:  [10-23] 18 (02/15 0750) BP: (105-168)/(54-94) 121/62 (02/15 0750) SpO2:  [91 %-100 %] 91 % (02/15 0750)  Intake/Output from previous day:  Intake/Output Summary (Last 24 hours) at 08/02/2021 0826 Last data filed at 08/02/2021 0600 Gross per 24 hour  Intake 2223.57 ml  Output 3070 ml  Net -846.43 ml    Intake/Output this shift: No intake/output data recorded.  Labs: Recent Labs    08/01/21 0643 08/02/21 0521  HGB 10.2* 8.3*   Recent Labs    08/01/21 0643 08/02/21 0521  WBC  --  9.1  RBC  --  2.96*  HCT 30.0* 26.9*  PLT  --  202   Recent Labs    08/01/21 0643 08/02/21 0521  NA 140 134*  K 4.4 4.6  CL 108 105  CO2  --  21*  BUN 33* 24*  CREATININE 1.20 1.19  GLUCOSE 208* 150*  CALCIUM  --  8.2*   No results for input(s): LABPT, INR in the last 72 hours.  EXAM General - Patient is Alert, Appropriate, and Oriented Extremity - ABD soft Neurovascular intact Dorsiflexion/Plantar flexion intact Incision: dressing C/D/I No cellulitis present Compartment soft Dressing/Incision - clean, dry, no drainage Motor Function - intact, moving foot and toes well on exam.  Abdomen with mild distention, intact bowel sounds this morning.  Past Medical History:  Diagnosis Date   AAA (abdominal aortic aneurysm)    a.) s/p repair in 2005   Anemia    Anginal pain ([place])    Anxiety    Arthritis    B12 deficiency    Bilateral  cataracts    a.) s/p BILATERAL extractions in 2018   BPH with obstruction/lower urinary tract symptoms    CAD (coronary artery disease)    Carotid artery stenosis    a.) s/p CEA on the RIGHT   COPD (chronic obstructive pulmonary disease) ([facility])    Diastolic dysfunction 61/44/3154   a.)  TTE 07/29/2019: EF 50-55%; LA mildly enlarged; G1DD.   DOE (dyspnea on exertion)    Elevated PSA    Environmental and seasonal allergies    History of 2019 novel coronavirus disease (COVID-19)    History of kidney stones    HLD (hyperlipidemia)    HOH (hard of hearing)    HTN (hypertension)    Incomplete bladder emptying    Macular degeneration    Prostate cancer ([facility])    RBBB (right bundle branch block)    Valvular insufficiency    a.) TTE 07/29/2019: LVEF 50-55%; LA mild enlarged; triv AR/PR, mild MR, mod TR.    Assessment/Plan: 1 Day Post-Op Procedure(s) (LRB): TOTAL HIP ARTHROPLASTY (Right) Principal Problem:   Status post total hip replacement, right  Estimated body mass index is 31.09 kg/m as calculated from the following:   Height as of this encounter: 5\' 2"  (0.086 m).   Weight as of this encounter: 77.1 kg. Advance diet Up with therapy D/C IV fluids when  tolerating po intake.  Labs reviewed this AM, Hg 8.3, HCT 26.9.  No drainage at the incision site. Foley has been removed this AM, will monitor the patient's urination. Up with therapy today, plan is for d/c home with HHPT. Begin working on BM. Possible d/c home today or tomorrow pending progress with therapy and being able to urinate.  DVT Prophylaxis -  SCDs, Eliquis Weight-Bearing as tolerated to right leg  J. Cameron Proud, PA-C [facility] 08/02/2021, 8:26 AM

## 2021-08-02 NOTE — Care Management CC44 (Signed)
Condition Code 44 Documentation Completed  Patient Details  Name: Ruben Green MRN: [id number] Date of Birth: 27-Dec-1933   Condition Code 44 given:  Yes Patient signature on Condition Code 44 notice:  Yes Documentation of 2 MD's agreement:  Yes Code 44 added to claim:  Yes    Conception Oms, RN 08/02/2021, 1:28 PM

## 2021-08-02 NOTE — Anesthesia Postprocedure Evaluation (Signed)
Anesthesia Post Note  Patient: Ruben Green  Procedure(s) Performed: TOTAL HIP ARTHROPLASTY (Right: Hip)  Patient location during evaluation: Nursing Unit Anesthesia Type: Spinal Level of consciousness: oriented and awake and alert Pain management: pain level controlled Vital Signs Assessment: post-procedure vital signs reviewed and stable Respiratory status: spontaneous breathing and respiratory function stable Cardiovascular status: blood pressure returned to baseline and stable Postop Assessment: no headache, no backache, no apparent nausea or vomiting and patient able to bend at knees Anesthetic complications: no   No notable events documented.   Last Vitals:  Vitals:   08/02/21 0750 08/02/21 1158  BP: 121/62 (!) 145/62  Pulse: 91 90  Resp: 18 18  Temp: 36.8 C 36.8 C  SpO2: 91% (!) 85%    Last Pain:  Vitals:   08/01/21 2251  TempSrc:   PainSc: Asleep                 Caryl Asp

## 2021-08-02 NOTE — Care Management Obs Status (Addendum)
[facility] NOTIFICATION   Patient Details  Name: Ruben Green MRN: [id number] Date of Birth: 05-15-1934   Medicare Observation Status Notification Given:  Yes    Conception Oms, RN 08/02/2021, 10:01 AM

## 2021-08-02 NOTE — TOC Progression Note (Addendum)
Transition of Care [facility]) - Progression Note    Patient Details  Name: Ruben Green MRN: [id number] Date of Birth: January 04, 1934  Transition of Care [facility]) CM/SW [place], RN Phone Number: 08/02/2021, 9:23 AM  Clinical Narrative:   TOC CM reached out to Merleen Nicely at [facility] at [place] at [place], Sharyn Creamer at [place], Tommi Rumps at [place], Clarise Cruz at [place] attempting to locate a [facility] agency to accept for [facility] PT, Lajean Manes is out of network with [facility] and therefore unable to accept the patient for [facility], awaiting to hear from others, [facility] unable to accept [facility] will accept the patient with a [place] next week         Expected Discharge Plan and Services                                                 Social Determinants of Health (SDOH) Interventions    Readmission Risk Interventions No flowsheet data found.

## 2021-08-02 NOTE — Progress Notes (Signed)
Physical Therapy Treatment Patient Details Name: Ruben Green MRN: [id number] DOB: [birth year] Today's Date: 08/02/2021   History of Present Illness Pt admitted for R THR and is POD 0 at time of evaluation. History includes prostate cancer, COPD, HLD, HTN.    PT Comments    Pt is making good progress towards goals. Difficult to communicate with due to [facility] and language barrier. Con in room, very helpful for family education. Per son, pt will have family support at home. Has 2 STE without rails and lives in 1 story home. Encouraged to stay in recliner throughout morning. Will continue to progress as able.   Recommendations for follow up therapy are one component of a multi-disciplinary discharge planning process, led by the attending physician.  Recommendations may be updated based on patient status, additional functional criteria and insurance authorization.  Follow Up Recommendations  Home health PT     Assistance Recommended at Discharge Frequent or constant Supervision/Assistance  Patient can return home with the following A little help with walking and/or transfers;A little help with bathing/dressing/bathroom;Assist for transportation;Help with stairs or ramp for entrance   Equipment Recommendations  Rolling walker (2 wheels);BSC/3in1    Recommendations for Other Services       Precautions / Restrictions Precautions Precautions: Posterior Hip;Fall Precaution Booklet Issued: No Restrictions Weight Bearing Restrictions: Yes RLE Weight Bearing: Weight bearing as tolerated     Mobility  Bed Mobility Overal bed mobility: Needs Assistance Bed Mobility: Supine to Sit     Supine to sit: Min assist     General bed mobility comments: needs assist for maintaining hip precautions. Once seated, able to demonstrate upright posutre    Transfers Overall transfer level: Needs assistance Equipment used: Rolling walker (2 wheels) Transfers: Sit to/from Stand Sit to Stand: Min  guard           General transfer comment: keeps too far away from RW, needs cues for sequencing    Ambulation/Gait Ambulation/Gait assistance: Min guard Gait Distance (Feet): 50 Feet Assistive device: Rolling walker (2 wheels) Gait Pattern/deviations: Step-to pattern       General Gait Details: ambulated in room, pt declined to ambulate in hallway. Needs cues for keeping RW close to body and for maintaining hip precautions during turns. Reports mod pain with mobility   Stairs             Wheelchair Mobility    Modified Rankin (Stroke Patients Only)       Balance Overall balance assessment: Needs assistance Sitting-balance support: Feet supported Sitting balance-Leahy Scale: Good     Standing balance support: Bilateral upper extremity supported Standing balance-Leahy Scale: Good                              Cognition Arousal/Alertness: Awake/alert Behavior During Therapy: WFL for tasks assessed/performed Overall Cognitive Status: Within Functional Limits for tasks assessed                                 General Comments: son in room and present, very helpful for translation        Exercises Other Exercises Other Exercises: supine ther-ex performed on R LE including AP, heel slides, and hip abd/add. 10 reps with cga Other Exercises: educated on hip precautions. Educated son as well, planning to bring written HEP in PM session    General Comments  Pertinent Vitals/Pain Pain Assessment Pain Assessment: 0-10 Pain Score: 5  Pain Location: R hip Pain Descriptors / Indicators: Operative site guarding Pain Intervention(s): Limited activity within patient's tolerance, Repositioned, Premedicated before session    Home Living                          Prior Function            PT Goals (current goals can now be found in the care plan section) Acute Rehab PT Goals Patient Stated Goal: to go home PT Goal  Formulation: With patient Time For Goal Achievement: 08/15/21 Potential to Achieve Goals: Good Progress towards PT goals: Progressing toward goals    Frequency    BID      PT Plan Current plan remains appropriate    Co-evaluation              AM-PAC PT "6 Clicks" Mobility   Outcome Measure  Help needed turning from your back to your side while in a flat bed without using bedrails?: A Little Help needed moving from lying on your back to sitting on the side of a flat bed without using bedrails?: A Little Help needed moving to and from a bed to a chair (including a wheelchair)?: A Little Help needed standing up from a chair using your arms (e.g., wheelchair or bedside chair)?: A Little Help needed to walk in hospital room?: A Little Help needed climbing 3-5 steps with a railing? : A Little 6 Click Score: 18    End of Session Equipment Utilized During Treatment: Gait belt Activity Tolerance: Patient tolerated treatment well Patient left: in chair;with chair alarm set;with family/visitor present Nurse Communication: Mobility status PT Visit Diagnosis: Muscle weakness (generalized) (M62.81);Difficulty in walking, not elsewhere classified (R26.2);Pain Pain - Right/Left: Right Pain - part of body: Hip     Time: 1610-9604 PT Time Calculation (min) (ACUTE ONLY): 23 min  Charges:  $Gait Training: 8-22 mins $Therapeutic Exercise: 8-22 mins                     Ruben Green, PT, DPT, GCS [phone]    Ruben Green 08/02/2021, 11:54 AM

## 2021-08-02 NOTE — Evaluation (Signed)
Occupational Therapy Evaluation Patient Details Name: Ruben Green MRN: [id number] DOB: 23-Jan-1934 Today's Date: 08/02/2021   History of Present Illness Pt admitted for R THR 2/14. PMH includes prostate cancer, COPD, HLD, HTN.   Clinical Impression   Pt seen for OT evaluation this date, POD#1 from above surgery. Upon arrival to room, pt seated in recliner. Son present and interpreting throughout session. At baseline, pt was independent in all ADLs prior to surgery. Pt is eager to return to PLOF with less pain and improved safety and independence. Pt's son was able to recall 3/3 posterior total hip precautions at start of session, however pt and pt's son unable to verbalize how to implement during ADL and mobility. Pt & pt's son instructed on how to implement hip precautions during during dressing, bathing, and toileting; pt & son verbalized understanding and handout provided. Pt currently requires minimal assist for LB dressing while in seated position with AD (e.g., reacher and sock aide) and MIN GUARD for BSC transfers due to pain and limited AROM of R hip. Pt would benefit from additional instruction in self care skills and techniques to help maintain precautions with or without assistive devices to support recall and carryover prior to discharge. Recommend HHOT upon discharge.    Recommendations for follow up therapy are one component of a multi-disciplinary discharge planning process, led by the attending physician.  Recommendations may be updated based on patient status, additional functional criteria and insurance authorization.   Follow Up Recommendations  Home health OT    Assistance Recommended at Discharge Intermittent Supervision/Assistance  Patient can return home with the following A little help with walking and/or transfers;A lot of help with bathing/dressing/bathroom    Functional Status Assessment  Patient has had a recent decline in their functional status and  demonstrates the ability to make significant improvements in function in a reasonable and predictable amount of time.  Equipment Recommendations  BSC/3in1;Other (comment) (2ww)       Precautions / Restrictions Precautions Precautions: Posterior Hip;Fall Precaution Booklet Issued: No Restrictions Weight Bearing Restrictions: Yes RLE Weight Bearing: Weight bearing as tolerated      Mobility Bed Mobility               General bed mobility comments: not assessed, in recliner pre/post session    Transfers Overall transfer level: Needs assistance Equipment used: Rolling walker (2 wheels) Transfers: Sit to/from Stand Sit to Stand: Min guard           General transfer comment: requires cues for safe hand placement with RW use      Balance Overall balance assessment: Needs assistance Sitting-balance support: Feet supported Sitting balance-Leahy Scale: Good     Standing balance support: Bilateral upper extremity supported, During functional activity, Reliant on assistive device for balance Standing balance-Leahy Scale: Good                             ADL either performed or assessed with clinical judgement   ADL Overall ADL's : Needs assistance/impaired                     Lower Body Dressing: Minimal assistance;Sitting/lateral leans;Adhering to hip precautions;With adaptive equipment Lower Body Dressing Details (indicate cue type and reason): to don/doff socks with reacher and sock aide Toilet Transfer: Min guard;Adhering to hip precautions;BSC/3in1;Rolling walker (2 wheels) Toilet Transfer Details (indicate cue type and reason): Requires constant cues for proper hand placement with  RW use                 Vision Baseline Vision/History: 1 Wears glasses Ability to See in Adequate Light: 0 Adequate Patient Visual Report: No change from baseline              Pertinent Vitals/Pain Pain Assessment Pain Assessment: No/denies pain  (reports no pain, but grimaces during movement) Pain Descriptors / Indicators: Operative site guarding        Extremity/Trunk Assessment Upper Extremity Assessment Upper Extremity Assessment: Overall WFL for tasks assessed   Lower Extremity Assessment Lower Extremity Assessment: Generalized weakness       Communication Communication Communication: HOH;Prefers language other than English   Cognition Arousal/Alertness: Awake/alert Behavior During Therapy: WFL for tasks assessed/performed Overall Cognitive Status: Within Functional Limits for tasks assessed                                                  Home Living Family/patient expects to be discharged to:: Private residence Living Arrangements: Spouse/significant other Available Help at Discharge: Family Type of Home: House Home Access: Stairs to enter Technical brewer of Steps: 4 Entrance Stairs-Rails: None       Bathroom Shower/Tub: Chief Strategy Officer: None          Prior Functioning/Environment Prior Level of Function : Working/employed;Independent/Modified Independent             Mobility Comments: 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 ADLs Comments: Reports he is independent with ADLs        OT Problem List: Decreased range of motion;Decreased activity tolerance;Impaired balance (sitting and/or standing);Decreased knowledge of precautions;Decreased knowledge of use of DME or AE      OT Treatment/Interventions: Self-care/ADL training;Therapeutic exercise;Therapeutic activities;Patient/family education;Balance training;DME and/or AE instruction    OT Goals(Current goals can be found in the care plan section) Acute Rehab OT Goals Patient Stated Goal: to go home OT Goal Formulation: With patient Time For Goal Achievement: 08/16/21 Potential to Achieve Goals: Good ADL Goals Pt Will Perform Lower Body Dressing: with supervision;with  caregiver independent in assisting;sit to/from stand Pt Will Transfer to Toilet: with supervision;ambulating;bedside commode Pt Will Perform Toileting - Clothing Manipulation and hygiene: with supervision;sitting/lateral leans  OT Frequency: Min 2X/week       AM-PAC OT "6 Clicks" Daily Activity     Outcome Measure Help from another person eating meals?: None Help from another person taking care of personal grooming?: A Little Help from another person toileting, which includes using toliet, bedpan, or urinal?: A Little Help from another person bathing (including washing, rinsing, drying)?: A Lot Help from another person to put on and taking off regular upper body clothing?: A Little Help from another person to put on and taking off regular lower body clothing?: A Lot 6 Click Score: 17   End of Session Equipment Utilized During Treatment: Rolling walker (2 wheels) Nurse Communication: Mobility status  Activity Tolerance: Patient tolerated treatment well Patient left: in chair;with call bell/phone within reach;with chair alarm set  OT Visit Diagnosis: Unsteadiness on feet (R26.81)                Time: 6834-1962 OT Time Calculation (min): 39 min Charges:  OT General Charges $OT Visit: 1 Visit OT Evaluation $OT Eval  Moderate Complexity: 1 Mod OT Treatments $Self Care/Home Management : 23-37 mins  Fredirick Maudlin, OTR/L [place]

## 2021-08-02 NOTE — Progress Notes (Signed)
Physical Therapy Treatment Patient Details Name: Ruben Green MRN: [id number] DOB: Oct 12, 1933 Today's Date: 08/02/2021   History of Present Illness Pt admitted for R THR 2/14. PMH includes prostate cancer, COPD, HLD, HTN.    PT Comments    Pt is making good progress towards goals with ability to safely ambulate around RN station using RW. Cues for hip precautions and safety. Limited recall of hip precautions, educated family at bedside. Pt reports he wasn't comfortable in recliner and prefers to return to bed. Plan for stair training next date for safe entry to home. Will continue to progress.   Recommendations for follow up therapy are one component of a multi-disciplinary discharge planning process, led by the attending physician.  Recommendations may be updated based on patient status, additional functional criteria and insurance authorization.  Follow Up Recommendations  Home health PT     Assistance Recommended at Discharge Frequent or constant Supervision/Assistance  Patient can return home with the following A little help with walking and/or transfers;A little help with bathing/dressing/bathroom;Assist for transportation;Help with stairs or ramp for entrance   Equipment Recommendations  Rolling walker (2 wheels);BSC/3in1    Recommendations for Other Services       Precautions / Restrictions Precautions Precautions: Posterior Hip;Fall Precaution Booklet Issued: Yes (comment) Restrictions Weight Bearing Restrictions: Yes RLE Weight Bearing: Weight bearing as tolerated     Mobility  Bed Mobility Overal bed mobility: Needs Assistance Bed Mobility: Supine to Sit     Supine to sit: Min assist     General bed mobility comments: needs assist for B LEs management as well as scooting up in bed.    Transfers Overall transfer level: Needs assistance Equipment used: Rolling walker (2 wheels) Transfers: Sit to/from Stand Sit to Stand: Min guard            General transfer comment: requires cues for safe hand placement with RW use    Ambulation/Gait Ambulation/Gait assistance: Min guard Gait Distance (Feet): 200 Feet Assistive device: Rolling walker (2 wheels) Gait Pattern/deviations: Step-through pattern       General Gait Details: able to ambulate around RN station with chair follow for safety. Reciprocal gait pattern performed with cues to keep RW close to body. Mod pain with mobility   Stairs             Wheelchair Mobility    Modified Rankin (Stroke Patients Only)       Balance Overall balance assessment: Needs assistance Sitting-balance support: Feet supported Sitting balance-Leahy Scale: Good     Standing balance support: Bilateral upper extremity supported, During functional activity, Reliant on assistive device for balance Standing balance-Leahy Scale: Good                              Cognition Arousal/Alertness: Awake/alert Behavior During Therapy: WFL for tasks assessed/performed Overall Cognitive Status: Within Functional Limits for tasks assessed                                 General Comments: wife in room        Exercises Other Exercises Other Exercises: supine ther-ex performed on R LE including AP, heel slides, and hip abd/add. 10 reps with cga Other Exercises: supine ther-ex performed on R LE including AP, quad sets, glut sets, and SAQ. 12 reps. Written HEP given and reviewed with pt/spouse    General Comments  Pertinent Vitals/Pain Pain Assessment Pain Assessment: 0-10 Pain Score: 6  Pain Location: R hip Pain Descriptors / Indicators: Operative site guarding Pain Intervention(s): Limited activity within patient's tolerance, Premedicated before session    Home Living Family/patient expects to be discharged to:: Private residence Living Arrangements: Spouse/significant other Available Help at Discharge: Family Type of Home: House Home Access:  Stairs to enter Entrance Stairs-Rails: None Entrance Stairs-Number of Steps: 4     Home Equipment: None      Prior Function            PT Goals (current goals can now be found in the care plan section) Acute Rehab PT Goals Patient Stated Goal: to go home PT Goal Formulation: With patient Time For Goal Achievement: 08/15/21 Potential to Achieve Goals: Good Progress towards PT goals: Progressing toward goals    Frequency    BID      PT Plan Current plan remains appropriate    Co-evaluation              AM-PAC PT "6 Clicks" Mobility   Outcome Measure  Help needed turning from your back to your side while in a flat bed without using bedrails?: A Little Help needed moving from lying on your back to sitting on the side of a flat bed without using bedrails?: A Little Help needed moving to and from a bed to a chair (including a wheelchair)?: A Little Help needed standing up from a chair using your arms (e.g., wheelchair or bedside chair)?: A Little Help needed to walk in hospital room?: A Little Help needed climbing 3-5 steps with a railing? : A Little 6 Click Score: 18    End of Session Equipment Utilized During Treatment: Gait belt Activity Tolerance: Patient tolerated treatment well Patient left: in bed;with bed alarm set;with SCD's reapplied Nurse Communication: Mobility status PT Visit Diagnosis: Muscle weakness (generalized) (M62.81);Difficulty in walking, not elsewhere classified (R26.2);Pain Pain - Right/Left: Right Pain - part of body: Hip     Time: 7824-2353 PT Time Calculation (min) (ACUTE ONLY): 41 min  Charges:  $Gait Training: 23-37 mins $Therapeutic Exercise: 8-22 mins                     Greggory Stallion, PT, DPT, GCS [phone]    Ruben Green 08/02/2021, 2:26 PM

## 2021-08-03 ENCOUNTER — Observation Stay: Payer: PPO

## 2021-08-03 DIAGNOSIS — K449 Diaphragmatic hernia without obstruction or gangrene: Secondary | ICD-10-CM | POA: Diagnosis not present

## 2021-08-03 DIAGNOSIS — J9811 Atelectasis: Secondary | ICD-10-CM | POA: Diagnosis not present

## 2021-08-03 DIAGNOSIS — J439 Emphysema, unspecified: Secondary | ICD-10-CM | POA: Diagnosis not present

## 2021-08-03 LAB — CBC
HCT: 25.8 % — ABNORMAL LOW (ref 39.0–52.0)
Hemoglobin: 8.1 g/dL — ABNORMAL LOW (ref 13.0–17.0)
MCH: 27.9 pg (ref 26.0–34.0)
MCHC: 31.4 g/dL (ref 30.0–36.0)
MCV: 89 fL (ref 80.0–100.0)
Platelets: 182 10*3/uL (ref 150–400)
RBC: 2.9 MIL/uL — ABNORMAL LOW (ref 4.22–5.81)
RDW: 16 % — ABNORMAL HIGH (ref 11.5–15.5)
WBC: 8.7 10*3/uL (ref 4.0–10.5)
nRBC: 0 % (ref 0.0–0.2)

## 2021-08-03 LAB — BASIC METABOLIC PANEL
Anion gap: 8 (ref 5–15)
BUN: 33 mg/dL — ABNORMAL HIGH (ref 8–23)
CO2: 23 mmol/L (ref 22–32)
Calcium: 8.3 mg/dL — ABNORMAL LOW (ref 8.9–10.3)
Chloride: 105 mmol/L (ref 98–111)
Creatinine, Ser: 1.22 mg/dL (ref 0.61–1.24)
GFR, Estimated: 58 mL/min — ABNORMAL LOW (ref >60)
Glucose, Bld: 124 mg/dL — ABNORMAL HIGH (ref 70–99)
Potassium: 4.3 mmol/L (ref 3.5–5.1)
Sodium: 136 mmol/L (ref 135–145)

## 2021-08-03 MED ORDER — ACETAMINOPHEN 500 MG PO TABS: 500.0000 mg | ORAL_TABLET | Freq: Four times a day (QID) | ORAL | Status: DC | PRN

## 2021-08-03 MED ORDER — IOHEXOL 350 MG/ML SOLN
75.0000 mL | Freq: Once | INTRAVENOUS | Status: AC | PRN
  Administered 2021-08-03: 75 mL via INTRAVENOUS

## 2021-08-03 MED ORDER — TAMSULOSIN HCL 0.4 MG PO CAPS
0.4000 mg | ORAL_CAPSULE | Freq: Every day | ORAL | Status: DC
  Administered 2021-08-03 – 2021-08-05 (×3): 0.4 mg via ORAL
  Filled 2021-08-03 (×3): qty 1

## 2021-08-03 MED ORDER — CHLORHEXIDINE GLUCONATE CLOTH 2 % EX PADS
6.0000 | MEDICATED_PAD | Freq: Every day | CUTANEOUS | Status: DC
  Administered 2021-08-04 – 2021-08-05 (×2): 6 via TOPICAL

## 2021-08-03 MED ORDER — ACETAMINOPHEN 325 MG PO TABS
650.0000 mg | ORAL_TABLET | ORAL | Status: DC | PRN
  Administered 2021-08-03 – 2021-08-05 (×9): 650 mg via ORAL
  Filled 2021-08-03 (×10): qty 2

## 2021-08-03 NOTE — TOC Progression Note (Signed)
Transition of Care [facility]) - Progression Note    Patient Details  Name: Ruben Green MRN: [id number] Date of Birth: 05-Jul-1933  [facility] CM/SW Contact  Conception Oms, RN Phone Number: 08/03/2021, 9:12 AM  Clinical Narrative:   Going to outpatient PT, Has appointment for this week    Expected Discharge Plan: [place] Barriers to Discharge: Continued Medical Work up  Expected Discharge Plan and Services Expected Discharge Plan: [place]   Discharge Planning Services: CM Consult   Living arrangements for the past 2 months: Single Family Home                 DME Arranged: Gilford Rile rolling, 3-N-1 DME Agency: [employer] Date DME Agency Contacted: 08/02/21 Time DME Agency Contacted: 1006 Representative spoke with at DME Agency: Darcel Bayley Arranged: PT           Social Determinants of Health ([place]) Interventions    Readmission Risk Interventions No flowsheet data found.

## 2021-08-03 NOTE — Progress Notes (Signed)
PT Cancellation Note  Patient Details Name: Ruben Green MRN: [id number] DOB: Mar 03, 1934   Cancelled Treatment:    Reason Eval/Treat Not Completed: Other (comment). Treatment attempted this morning. Pt sleeping soundly upon arrival with son present in room. Per son, pt has had rough night and just fell asleep. Requesting for therapy to be delayed at this time. Will re-attempt in PM session for stair training when pt is more alert.   Daemon Dowty 08/03/2021, 10:17 AM Greggory Stallion, PT, DPT, GCS [phone]

## 2021-08-03 NOTE — Progress Notes (Signed)
Occupational Therapy Treatment Patient Details Name: Ruben Green MRN: [id number] DOB: 1934/03/04 Today's Date: 08/03/2021   History of present illness Pt. is an 88 y.o. male wh was admitted for R THR 2/14. PMH includes prostate cancer, COPD, HLD, HTN.   OT comments  Pt. Son present, and providing [place] language interpretation for pt. Pt./caregiver education was provided about Posterior hip precautions, and implications for ADL/IADLs. Pt. Son declined review of A/E reporting that pt. will have the assist needed at home from family. Reviewed, and assisted with problem solving through anticipated needs at home. Pt. Continues to benefit from OT services for ADL training, A/E training, and pt./caregiver Education about home modification, and DME.    Recommendations for follow up therapy are one component of a multi-disciplinary discharge planning process, led by the attending physician.  Recommendations may be updated based on patient status, additional functional criteria and insurance authorization.    Follow Up Recommendations  Home health OT    Assistance Recommended at Discharge    Patient can return home with the following  A little help with walking and/or transfers;A lot of help with bathing/dressing/bathroom;Assistance with cooking/housework   Equipment Recommendations       Recommendations for Other Services      Precautions / Restrictions Precautions Precautions: Posterior Hip;Fall Restrictions Weight Bearing Restrictions: Yes RLE Weight Bearing: Weight bearing as tolerated       Mobility Bed Mobility                    Transfers                   General transfer comment: deferred     Balance                                           ADL either performed or assessed with clinical judgement   ADL                                         General ADL Comments: MaxA LE ADLs    Extremity/Trunk Assessment  Upper Extremity Assessment Upper Extremity Assessment: Overall WFL for tasks assessed            Vision       Perception     Praxis      Cognition Arousal/Alertness: Awake/alert Behavior During Therapy: Different form baseline Overall Cognitive Status: Within Functional Limits for tasks assessed                                          Exercises      Shoulder Instructions       General Comments      Pertinent Vitals/ Pain       Pain Assessment Pain Assessment: 0-10 Pain Score: 8  Pain Location: Right Hip Pain Intervention(s): Monitored during session  Home Living                                          Prior Functioning/Environment  Frequency  Min 2X/week        Progress Toward Goals  OT Goals(current goals can now be found in the care plan section)  Progress towards OT goals: Progressing toward goals  Acute Rehab OT Goals Patient Stated Goal: To go home OT Goal Formulation: With patient Time For Goal Achievement: 08/16/21 Potential to Achieve Goals: Good  Plan      Co-evaluation                 AM-PAC OT "6 Clicks" Daily Activity     Outcome Measure   Help from another person eating meals?: None Help from another person taking care of personal grooming?: A Little Help from another person toileting, which includes using toliet, bedpan, or urinal?: A Little Help from another person bathing (including washing, rinsing, drying)?: A Lot Help from another person to put on and taking off regular upper body clothing?: A Little Help from another person to put on and taking off regular lower body clothing?: A Lot 6 Click Score: 17    End of Session Equipment Utilized During Treatment: Rolling walker (2 wheels)  OT Visit Diagnosis: Muscle weakness (generalized) (M62.81)   Activity Tolerance Patient tolerated treatment well   Patient Left in bed;with bed alarm set;with family/visitor  present   Nurse Communication          Time: 3500-9381 OT Time Calculation (min): 15 min  Charges: OT General Charges $OT Visit: 1 Visit OT Treatments $Self Care/Home Management : 8-22 mins Harrel Carina, MS, OTR/L   Harrel Carina 08/03/2021, 5:12 PM

## 2021-08-03 NOTE — Progress Notes (Signed)
Subjective: 2 Days Post-Op Procedure(s) (LRB): TOTAL HIP ARTHROPLASTY (Right) Patient reports pain as mild.   Patient is well, patient with hypoxia, CT chest negative for PE. Discussed with therapy, he was able to navigate stairs and his SpO2 remained above 90%. Patient with urinary retention, foley has been placed.  Will plan on leaving foley in at this time and follow-up with urology next week for removal. Son in the room with him, they help with interpretation. Plan is to go Home after hospital stay. Negative for chest pain and shortness of breath Fever: no Gastrointestinal:Negative for nausea and vomiting He has had a BM since surgery.   Objective: Vital signs in last 24 hours: Temp:  [98 F (36.7 C)-98.6 F (37 C)] 98.5 F (36.9 C) (02/16 1544) Pulse Rate:  [89-110] 110 (02/16 1544) Resp:  [16-18] 17 (02/16 1544) BP: (114-137)/(42-79) 137/54 (02/16 1544) SpO2:  [80 %-95 %] 92 % (02/16 1545)  Intake/Output from previous day:  Intake/Output Summary (Last 24 hours) at 08/03/2021 1549 Last data filed at 08/03/2021 1012 Gross per 24 hour  Intake 120 ml  Output 1864 ml  Net -1744 ml    Intake/Output this shift: Total I/O In: 120 [P.O.:120] Out: 200 [Urine:200]  Labs: Recent Labs    08/01/21 0643 08/02/21 0521 08/03/21 0619  HGB 10.2* 8.3* 8.1*   Recent Labs    08/02/21 0521 08/03/21 0619  WBC 9.1 8.7  RBC 2.96* 2.90*  HCT 26.9* 25.8*  PLT 202 182   Recent Labs    08/02/21 0521 08/03/21 0619  NA 134* 136  K 4.6 4.3  CL 105 105  CO2 21* 23  BUN 24* 33*  CREATININE 1.19 1.22  GLUCOSE 150* 124*  CALCIUM 8.2* 8.3*   No results for input(s): LABPT, INR in the last 72 hours.  EXAM General - Patient is Alert, Appropriate, and Oriented Extremity - ABD soft Neurovascular intact Dorsiflexion/Plantar flexion intact Incision: scant drainage No cellulitis present Compartment soft Dressing/Incision - Mild bloody drainage noted to the right hip. Motor  Function - intact, moving foot and toes well on exam.  Abdomen with mild distention, intact bowel sounds this morning.  Past Medical History:  Diagnosis Date   AAA (abdominal aortic aneurysm)    a.) s/p repair in 2005   Anemia    Anginal pain ([place])    Anxiety    Arthritis    B12 deficiency    Bilateral cataracts    a.) s/p BILATERAL extractions in 2018   BPH with obstruction/lower urinary tract symptoms    CAD (coronary artery disease)    Carotid artery stenosis    a.) s/p CEA on the RIGHT   COPD (chronic obstructive pulmonary disease) (HCC)    Diastolic dysfunction 63/06/6008   a.)  TTE 07/29/2019: EF 50-55%; LA mildly enlarged; G1DD.   DOE (dyspnea on exertion)    Elevated PSA    Environmental and seasonal allergies    History of 2019 novel coronavirus disease (COVID-19)    History of kidney stones    HLD (hyperlipidemia)    HOH (hard of hearing)    HTN (hypertension)    Incomplete bladder emptying    Macular degeneration    Prostate cancer (HCC)    RBBB (right bundle branch block)    Valvular insufficiency    a.) TTE 07/29/2019: LVEF 50-55%; LA mild enlarged; triv AR/PR, mild MR, mod TR.    Assessment/Plan: 2 Days Post-Op Procedure(s) (LRB): TOTAL HIP ARTHROPLASTY (Right) Principal Problem:   Status  post total hip replacement, right  Estimated body mass index is 31.09 kg/m as calculated from the following:   Height as of this encounter: 5\' 2"  (1.575 m).   Weight as of this encounter: 77.1 kg. Advance diet Up with therapy   Labs reviewed this AM, Hg 8.1, HCT 25.9.  Minimal drainage noted. Foley has been placed, plan on follow-up with urology next week. Patient with hypoxia.  CT chest negative for PE.  I am not sure how accurate his measurements are given that it is jumping significantly from the 70's into the 90's quickly.  Continue to monitor, he has not history of supplemental O2 at home.  PT was able to work with him without issues this afternoon. He has had  a BM. Plan for discharge home tomorrow.  DVT Prophylaxis -  SCDs, Eliquis Weight-Bearing as tolerated to right leg  J. Cameron Proud, PA-C [facility] 08/03/2021, 3:49 PM

## 2021-08-03 NOTE — Discharge Instructions (Addendum)
Instructions after Total Hip Replacement     J. Dorien Chihuahua, M.D.  Raquel Kannan Proia, PA-C     Dept[facility]  [address], [place]  [address]  Phone: [phone]   Fax: [phone]    DIET: Drink plenty of non-alcoholic fluids. Resume your normal diet. Include foods high in fiber.  ACTIVITY:  You may use crutches or a walker with weight-bearing as tolerated, unless instructed otherwise. You may be weaned off of the walker or crutches by your Physical Therapist.  Do NOT reach below the level of your knees or cross your legs until allowed.    Continue doing gentle exercises. Exercising will reduce the pain and swelling, increase motion, and prevent muscle weakness.   Please continue to use the TED compression stockings for 6 weeks. You may remove the stockings at night, but should reapply them in the morning. Do not drive or operate any equipment until instructed.  WOUND CARE:  Continue to use ice packs periodically to reduce pain and swelling. Keep the incision clean and dry. You may bathe or shower after the staples are removed at the first office visit following surgery.  MEDICATIONS: You may resume your regular medications. Please take the pain medication as prescribed on the medication. Do not take pain medication on an empty stomach. You have been given a prescription for a blood thinner to prevent blood clots. Please take the medication as instructed. (NOTE: After completing a 2 week course of Lovenox, take one Enteric-coated aspirin once a day.) Pain medications and iron supplements can cause constipation. Use a stool softener (Senokot or Colace) on a daily basis and a laxative (dulcolax or miralax) as needed. Do not drive or drink alcoholic beverages when taking pain medications.  CALL THE OFFICE FOR: Temperature above 101 degrees Excessive bleeding or drainage on the dressing. Excessive swelling, coldness, or  paleness of the toes. Persistent nausea and vomiting.  FOLLOW-UP:  You should have an appointment to return to the office in 2 weeks after surgery. Arrangements have been made for continuation of Physical Therapy (either home therapy or outpatient therapy).   You are being discharged with a foley catheter in place.  Leave in place, follow-up with urology next week for foley removal.  A referral has been sent over for you to schedule this.  Monitor oxygen at home.  If you are above 90% on room air you don't need to use the home oxygen.  If you are below 90% then use 1L of O2 at home for a short period of time.

## 2021-08-04 DIAGNOSIS — I10 Essential (primary) hypertension: Secondary | ICD-10-CM | POA: Diagnosis present

## 2021-08-04 DIAGNOSIS — Z96641 Presence of right artificial hip joint: Secondary | ICD-10-CM

## 2021-08-04 DIAGNOSIS — Z8249 Family history of ischemic heart disease and other diseases of the circulatory system: Secondary | ICD-10-CM | POA: Diagnosis not present

## 2021-08-04 DIAGNOSIS — Z9842 Cataract extraction status, left eye: Secondary | ICD-10-CM | POA: Diagnosis not present

## 2021-08-04 DIAGNOSIS — Z9841 Cataract extraction status, right eye: Secondary | ICD-10-CM | POA: Diagnosis not present

## 2021-08-04 DIAGNOSIS — Z87891 Personal history of nicotine dependence: Secondary | ICD-10-CM | POA: Diagnosis not present

## 2021-08-04 DIAGNOSIS — E785 Hyperlipidemia, unspecified: Secondary | ICD-10-CM | POA: Diagnosis present

## 2021-08-04 DIAGNOSIS — R0902 Hypoxemia: Secondary | ICD-10-CM | POA: Diagnosis not present

## 2021-08-04 DIAGNOSIS — M1611 Unilateral primary osteoarthritis, right hip: Secondary | ICD-10-CM | POA: Diagnosis present

## 2021-08-04 DIAGNOSIS — J449 Chronic obstructive pulmonary disease, unspecified: Secondary | ICD-10-CM | POA: Diagnosis present

## 2021-08-04 DIAGNOSIS — I2510 Atherosclerotic heart disease of native coronary artery without angina pectoris: Secondary | ICD-10-CM | POA: Diagnosis present

## 2021-08-04 DIAGNOSIS — Z79899 Other long term (current) drug therapy: Secondary | ICD-10-CM | POA: Diagnosis not present

## 2021-08-04 DIAGNOSIS — I714 Abdominal aortic aneurysm, without rupture, unspecified: Secondary | ICD-10-CM | POA: Diagnosis present

## 2021-08-04 DIAGNOSIS — Z8616 Personal history of COVID-19: Secondary | ICD-10-CM | POA: Diagnosis not present

## 2021-08-04 DIAGNOSIS — Z8546 Personal history of malignant neoplasm of prostate: Secondary | ICD-10-CM | POA: Diagnosis not present

## 2021-08-04 DIAGNOSIS — Z923 Personal history of irradiation: Secondary | ICD-10-CM | POA: Diagnosis not present

## 2021-08-04 DIAGNOSIS — F411 Generalized anxiety disorder: Secondary | ICD-10-CM | POA: Diagnosis present

## 2021-08-04 LAB — BASIC METABOLIC PANEL
Anion gap: 7 (ref 5–15)
BUN: 32 mg/dL — ABNORMAL HIGH (ref 8–23)
CO2: 23 mmol/L (ref 22–32)
Calcium: 8.2 mg/dL — ABNORMAL LOW (ref 8.9–10.3)
Chloride: 105 mmol/L (ref 98–111)
Creatinine, Ser: 1.27 mg/dL — ABNORMAL HIGH (ref 0.61–1.24)
GFR, Estimated: 55 mL/min — ABNORMAL LOW (ref >60)
Glucose, Bld: 136 mg/dL — ABNORMAL HIGH (ref 70–99)
Potassium: 4.8 mmol/L (ref 3.5–5.1)
Sodium: 135 mmol/L (ref 135–145)

## 2021-08-04 LAB — CBC
HCT: 25.3 % — ABNORMAL LOW (ref 39.0–52.0)
Hemoglobin: 7.8 g/dL — ABNORMAL LOW (ref 13.0–17.0)
MCH: 27.5 pg (ref 26.0–34.0)
MCHC: 30.8 g/dL (ref 30.0–36.0)
MCV: 89.1 fL (ref 80.0–100.0)
Platelets: 207 10*3/uL (ref 150–400)
RBC: 2.84 MIL/uL — ABNORMAL LOW (ref 4.22–5.81)
RDW: 16.2 % — ABNORMAL HIGH (ref 11.5–15.5)
WBC: 10.9 10*3/uL — ABNORMAL HIGH (ref 4.0–10.5)
nRBC: 0 % (ref 0.0–0.2)

## 2021-08-04 MED ORDER — SODIUM CHLORIDE 0.9 % IV BOLUS
500.0000 mL | Freq: Once | INTRAVENOUS | Status: AC
  Administered 2021-08-04: 500 mL via INTRAVENOUS

## 2021-08-04 MED ORDER — TIZANIDINE HCL 4 MG PO TABS: 4.0000 mg | ORAL_TABLET | Freq: Three times a day (TID) | ORAL | 0 refills | Status: DC | PRN

## 2021-08-04 MED ORDER — TAMSULOSIN HCL 0.4 MG PO CAPS: 0.4000 mg | ORAL_CAPSULE | Freq: Every day | ORAL | 0 refills | Status: DC

## 2021-08-04 MED ORDER — ACETAMINOPHEN 500 MG PO TABS: 500.0000 mg | ORAL_TABLET | Freq: Four times a day (QID) | ORAL | 0 refills | Status: AC | PRN

## 2021-08-04 MED ORDER — ONDANSETRON HCL 4 MG PO TABS
4.0000 mg | ORAL_TABLET | Freq: Four times a day (QID) | ORAL | 0 refills | Status: DC | PRN
Start: 2021-08-04 — End: 2021-12-28

## 2021-08-04 MED ORDER — OXYCODONE HCL 5 MG PO TABS: 2.5000 mg | ORAL_TABLET | ORAL | 0 refills | Status: DC | PRN

## 2021-08-04 MED ORDER — APIXABAN 2.5 MG PO TABS: 2.5000 mg | ORAL_TABLET | Freq: Two times a day (BID) | ORAL | 0 refills | Status: DC

## 2021-08-04 NOTE — Progress Notes (Signed)
Physical Therapy Treatment Patient Details Name: Ruben Green MRN: [id number] DOB: Jun 05, 1934 Today's Date: 08/04/2021   History of Present Illness Pt. is an 88 y.o. male wh was admitted for R THR 2/14. PMH includes prostate cancer, COPD, HLD, HTN.    PT Comments    Pt was in semi supine upon arriving with supportive son at bedside. Pt did require a little encouragement to participate however once motivated was cooperative and pleasant. He was on 4 L o2 upon arriving. Son reports pt does not usually wear O2. Throughout most of session, pt was wearing O2 but has poor pleth reliability on finger pulse oximeters (dynamap and portable finger oximeter). Author elected to use earlobe oximeter attached to dynamap for more accurate readings. When good pleth observed, pt was able to maintain > 90% without O2.PA and RN made aware. Pt does still require min assist to exit bed and presents with some impulsivity. Cognition is not at baseline, per son. He was however able to follow simple commands throughout and overall is moving well from a PT standpoint. Once in sitting, pt only required CGA-SBA for standing, ambulation, and performing stairs. He was able to safely go up and down stairs to simulate home environment. Family will be able to provide needed assistance at DC. Recommend continued skilled PT at DC to progress pt towards PLOF.     Recommendations for follow up therapy are one component of a multi-disciplinary discharge planning process, led by the attending physician.  Recommendations may be updated based on patient status, additional functional criteria and insurance authorization.  Follow Up Recommendations  Home health PT     Assistance Recommended at Discharge Frequent or constant Supervision/Assistance  Patient can return home with the following A little help with walking and/or transfers;A little help with bathing/dressing/bathroom;Assist for transportation;Help with stairs or ramp for  entrance   Equipment Recommendations  None recommended by PT (Pt has recieved equipment needs already)       Precautions / Restrictions Precautions Precautions: Posterior Hip;Fall Precaution Booklet Issued: Yes (comment) Restrictions Weight Bearing Restrictions: Yes RLE Weight Bearing: Weight bearing as tolerated     Mobility  Bed Mobility Overal bed mobility: Needs Assistance Bed Mobility: Supine to Sit     Supine to sit: Min assist Sit to supine: Supervision   General bed mobility comments: Pt continues to require some assistance to exit bed. son was present and reports that family will be able to provide the level of care required to safely return home at DC    Transfers Overall transfer level: Needs assistance Equipment used: Rolling walker (2 wheels) Transfers: Sit to/from Stand Sit to Stand: Min guard           General transfer comment: CGA + vcs for improved technique. pt does not require physical lifting assistance to stand from various surface heights.    Ambulation/Gait Ambulation/Gait assistance: Min guard, Supervision   Assistive device: Rolling walker (2 wheels) Gait Pattern/deviations: Step-through pattern, Antalgic Gait velocity: decreased     General Gait Details: Pt ambulated to rehab gym, performed stairs prior to ambulating back to room. Pt was on O2 throughout most of session however poor pleth readings throughout. Moved pulse Oximeter to earlobe with improved reliability. Pt is not on O2 at baseline. RN made aware pt was removed from O2 and will closely monitor throughout remainder of shift.   Stairs   Stairs assistance: Min guard Stair Management: No rails, Backwards, With walker, Step to pattern   General stair  comments: Pt/son were demonstrated how to safely perform stairs to simulate home entry. Pt was able to perform with vcs throughout. Overall he demonstrated safe enough abilities to perform with assistance.      Balance Overall  balance assessment: Needs assistance Sitting-balance support: Feet supported Sitting balance-Leahy Scale: Good     Standing balance support: Bilateral upper extremity supported, During functional activity, Reliant on assistive device for balance Standing balance-Leahy Scale: Good         Cognition Arousal/Alertness: Awake/alert Behavior During Therapy: WFL for tasks assessed/performed Overall Cognitive Status: Impaired/Different from baseline      General Comments: Son in room. Pt is alert but per son is not currently at baseline. Pt was slightly impulsive however able to follow commands mostly. [place] barriers make overall cognition hard to assess. reviewed hip precautions with son/pt               Pertinent Vitals/Pain Pain Assessment Pain Assessment: 0-10 Pain Score: 6  Pain Location: Right Hip Pain Descriptors / Indicators: Operative site guarding Pain Intervention(s): Limited activity within patient's tolerance, Monitored during session, Premedicated before session, Repositioned     PT Goals (current goals can now be found in the care plan section) Acute Rehab PT Goals Patient Stated Goal: to go home    Frequency    BID      PT Plan Current plan remains appropriate       AM-PAC PT "6 Clicks" Mobility   Outcome Measure  Help needed turning from your back to your side while in a flat bed without using bedrails?: A Little Help needed moving from lying on your back to sitting on the side of a flat bed without using bedrails?: A Little Help needed moving to and from a bed to a chair (including a wheelchair)?: A Little Help needed standing up from a chair using your arms (e.g., wheelchair or bedside chair)?: A Little Help needed to walk in hospital room?: A Little Help needed climbing 3-5 steps with a railing? : A Little 6 Click Score: 18    End of Session   Activity Tolerance: Patient tolerated treatment well Patient left: in bed;with call bell/phone  within reach;with bed alarm set;with nursing/sitter in room;with family/visitor present Nurse Communication: Mobility status PT Visit Diagnosis: Muscle weakness (generalized) (M62.81);Difficulty in walking, not elsewhere classified (R26.2);Pain Pain - Right/Left: Right Pain - part of body: Hip     Time:  -     Charges:                       Julaine Fusi PTA 08/04/21, 6:08 AM

## 2021-08-04 NOTE — TOC Progression Note (Signed)
Transition of Care [facility]) - Progression Note    Patient Details  Name: Ruben Green MRN: [id number] Date of Birth: [birth year]  Transition of Care [facility]) CM/SW [place], RN Phone Number: 08/04/2021, 9:28 AM  Clinical Narrative:   The patient needs home O2, [employer] notified and will deliver to the bedside, after the O2 sat notes are in    Expected Discharge Plan: [place] Barriers to Discharge: Continued Medical Work up  Expected Discharge Plan and Services Expected Discharge Plan: [place]   Discharge Planning Services: CM Consult   Living arrangements for the past 2 months: Single Family Home                 DME Arranged: Walker rolling, 3-N-1 DME Agency: [employer] Date DME Agency Contacted: 08/02/21 Time DME Agency Contacted: 1006 Representative spoke with at DME Agency: [place]: PT           Social Determinants of Health ([place]) Interventions    Readmission Risk Interventions No flowsheet data found.

## 2021-08-04 NOTE — Progress Notes (Addendum)
Physical Therapy Treatment Patient Details Name: Ruben Green MRN: [id number] DOB: December 24, 1933 Today's Date: 08/04/2021   History of Present Illness Pt. is an 88 y.o. male wh was admitted for R THR 2/14. PMH includes prostate cancer, COPD, HLD, HTN.    PT Comments    Pt was long sitting in bed upon arriving with supportive spouse at bedside.O2 was reapplied last night due to desaturation.Author removed O2 throughout most of session however with good pleth reading, sao2 did drop to below 88% several times. Quickly recovers with PLB. PA made aware that O2 at home is suggested. Wife says they do have a pulse oximeter to monitor when DC home already. She states that she does not feel pt is ready to go home. He demonstrated safe ability to exit bed, stand, and ambulate without LOB. Performed stairs with almost no assistance. I personally explained that pt is moving well enough to go home. Will see pt again later this date and continue to progress pt to maximal independence with ADLs. Will return to tx pt first thing after lunch.    Recommendations for follow up therapy are one component of a multi-disciplinary discharge planning process, led by the attending physician.  Recommendations may be updated based on patient status, additional functional criteria and insurance authorization.  Follow Up Recommendations  Home health PT     Assistance Recommended at Discharge Frequent or constant Supervision/Assistance  Patient can return home with the following A little help with walking and/or transfers;A little help with bathing/dressing/bathroom;Assist for transportation;Help with stairs or ramp for entrance   Equipment Recommendations  None recommended by PT       Precautions / Restrictions Precautions Precautions: Posterior Hip;Fall Precaution Booklet Issued: Yes (comment) Restrictions Weight Bearing Restrictions: Yes RLE Weight Bearing: Weight bearing as tolerated     Mobility  Bed  Mobility Overal bed mobility: Needs Assistance Bed Mobility: Supine to Sit     Supine to sit: Min assist Sit to supine: Supervision   General bed mobility comments: Pt continues to only require min assist to exit bed and no assistance to return to supine. He is slow moving due to pain however safe. Will have family available and assisting 24/7 at Dc    Transfers Overall transfer level: Needs assistance Equipment used: Rolling walker (2 wheels) Transfers: Sit to/from Stand Sit to Stand: Min guard, Supervision           General transfer comment: CGA for first STS however supervision only for last 3 STS transfers.    Ambulation/Gait Ambulation/Gait assistance: Supervision Gait Distance (Feet): 100 Feet Assistive device: Rolling walker (2 wheels) Gait Pattern/deviations: Step-through pattern, Antalgic Gait velocity: decreased     General Gait Details: pt was able to ambulate ~ 100 ft total with RW. More antalgic gait today however remains steady without LOB or safety concern.   Stairs   Stairs assistance: Supervision Stair Management: No rails, Backwards, With walker, Step to pattern Number of Stairs: 2 General stair comments: pt demonstrated safe ability to go up/down stairs without rails. Spouse present throughout stair trainign      Balance Overall balance assessment: Needs assistance Sitting-balance support: Feet supported Sitting balance-Leahy Scale: Good     Standing balance support: Bilateral upper extremity supported, During functional activity, Reliant on assistive device for balance Standing balance-Leahy Scale: Good     Cognition Arousal/Alertness: Awake/alert Behavior During Therapy: WFL for tasks assessed/performed Overall Cognitive Status: Impaired/Different from baseline      General Comments: Pt was alert  and cooperative but per spouse, still not as clear as he was prior to admission.               Pertinent Vitals/Pain Pain  Assessment Pain Assessment: 0-10 Pain Score: 8  Pain Location: Right Hip Pain Descriptors / Indicators: Operative site guarding Pain Intervention(s): Limited activity within patient's tolerance, Monitored during session, Premedicated before session, Repositioned     PT Goals (current goals can now be found in the care plan section) Acute Rehab PT Goals Patient Stated Goal: To go home tomorrow Progress towards PT goals: Progressing toward goals    Frequency    BID      PT Plan Current plan remains appropriate       AM-PAC PT "6 Clicks" Mobility   Outcome Measure  Help needed turning from your back to your side while in a flat bed without using bedrails?: A Little Help needed moving from lying on your back to sitting on the side of a flat bed without using bedrails?: A Little Help needed moving to and from a bed to a chair (including a wheelchair)?: A Little Help needed standing up from a chair using your arms (e.g., wheelchair or bedside chair)?: A Little Help needed to walk in hospital room?: A Little Help needed climbing 3-5 steps with a railing? : A Little 6 Click Score: 18    End of Session Equipment Utilized During Treatment: Gait belt Activity Tolerance: Patient tolerated treatment well Patient left: in bed;with call bell/phone within reach;with bed alarm set;with nursing/sitter in room;with family/visitor present Nurse Communication: Mobility status PT Visit Diagnosis: Muscle weakness (generalized) (M62.81);Difficulty in walking, not elsewhere classified (R26.2);Pain Pain - Right/Left: Right Pain - part of body: Hip     Time: 1694-5038 PT Time Calculation (min) (ACUTE ONLY): 31 min  Charges:  $Gait Training: 8-22 mins $Therapeutic Activity: 8-22 mins                    Julaine Fusi PTA 08/04/21, 9:09 AM

## 2021-08-04 NOTE — Progress Notes (Signed)
SATURATION QUALIFICATIONS: (This note is used to comply with regulatory documentation for home oxygen)  Patient Saturations on Room Air at Rest = 94%  Patient Saturations on Room Air while Ambulating = 85%  Patient Saturations on 2 Liters of oxygen while Ambulating = 92%  Please briefly explain why patient needs home oxygen: Patient desats while ambulating/exertion.

## 2021-08-04 NOTE — Progress Notes (Signed)
Physical Therapy Treatment Patient Details Name: Ruben Green MRN: [id number] DOB: 1933/12/13 Today's Date: 08/04/2021   History of Present Illness Pt. is an 88 y.o. male wh was admitted for R THR 2/14. PMH includes prostate cancer, COPD, HLD, HTN.    PT Comments    Pt agreeable to PT, BP much improved per nursing after receiving fluids since last PT session. Pt without c/o dizziness with positional changes. Continued c/o R hip pain since pt is now only taking Tylenol. Pt was 90% at rest on 2L O2, increased oxygen to 3L for gait training 163ft with RW and supervision for safety. Pt required 4 standing rest breaks to encourage PLB technique due to O2 sats dropping to 80%.  Pt however did not feel significantly SOB. Pt returned to room, titrated back to 2L O2 and returned again to 90% once resting back in bed. Pt remains slightly confused per family, and appeared more fatigued this afternoon. He may benefit from staying until tomorrow for pt and family safety.    Recommendations for follow up therapy are one component of a multi-disciplinary discharge planning process, led by the attending physician.  Recommendations may be updated based on patient status, additional functional criteria and insurance authorization.  Follow Up Recommendations  Home health PT     Assistance Recommended at Discharge Frequent or constant Supervision/Assistance  Patient can return home with the following A little help with walking and/or transfers;A little help with bathing/dressing/bathroom;Assist for transportation;Help with stairs or ramp for entrance   Equipment Recommendations  None recommended by PT    Recommendations for Other Services       Precautions / Restrictions Precautions Precautions: Posterior Hip;Fall Precaution Booklet Issued: Yes (comment) Restrictions Weight Bearing Restrictions: Yes RLE Weight Bearing: Weight bearing as tolerated     Mobility  Bed Mobility Overal bed  mobility: Needs Assistance Bed Mobility: Supine to Sit     Supine to sit: Min assist Sit to supine: Supervision (for R LE)   General bed mobility comments: Pt continues to only require min assist to exit bed and no assistance to return to supine. He is slow moving due to pain however safe. Will have family available and assisting 24/7 at Dc    Transfers Overall transfer level: Needs assistance Equipment used: Rolling walker (2 wheels) Transfers: Sit to/from Stand Sit to Stand: Min guard, Supervision                Ambulation/Gait Ambulation/Gait assistance: Supervision Gait Distance (Feet): 160 Feet Assistive device: Rolling walker (2 wheels) Gait Pattern/deviations: Step-through pattern, Antalgic Gait velocity: decreased     General Gait Details: Pt ambulated on 3L O2 with occasional standing rest breaks for PLB   Stairs             Wheelchair Mobility    Modified Rankin (Stroke Patients Only)       Balance Overall balance assessment: Needs assistance Sitting-balance support: Feet supported Sitting balance-Leahy Scale: Good     Standing balance support: Bilateral upper extremity supported, During functional activity, Reliant on assistive device for balance Standing balance-Leahy Scale: Good                              Cognition Arousal/Alertness: Awake/alert Behavior During Therapy: WFL for tasks assessed/performed Overall Cognitive Status: Impaired/Different from baseline (per pt's son) Area of Impairment: Following commands, Safety/judgement, Awareness  Following Commands: Follows one step commands inconsistently Safety/Judgement: Decreased awareness of safety, Decreased awareness of deficits     General Comments:  (Pt still not cognitively clear per family)        Exercises      General Comments General comments (skin integrity, edema, etc.):  (Pt very motivated to improve mobility)       Pertinent Vitals/Pain Pain Assessment Pain Assessment: 0-10 Pain Score: 5  Pain Location: Right Hip Pain Descriptors / Indicators: Operative site guarding Pain Intervention(s): Monitored during session    Home Living                          Prior Function            PT Goals (current goals can now be found in the care plan section) Acute Rehab PT Goals Patient Stated Goal: To go home tomorrow Progress towards PT goals: Progressing toward goals    Frequency    BID      PT Plan Current plan remains appropriate    Co-evaluation              AM-PAC PT "6 Clicks" Mobility   Outcome Measure  Help needed turning from your back to your side while in a flat bed without using bedrails?: A Little Help needed moving from lying on your back to sitting on the side of a flat bed without using bedrails?: A Little Help needed moving to and from a bed to a chair (including a wheelchair)?: A Little Help needed standing up from a chair using your arms (e.g., wheelchair or bedside chair)?: A Little Help needed to walk in hospital room?: A Little Help needed climbing 3-5 steps with a railing? : A Little 6 Click Score: 18    End of Session Equipment Utilized During Treatment: Gait belt Activity Tolerance: Patient tolerated treatment well Patient left: in bed;with call bell/phone within reach;with bed alarm set;with nursing/sitter in room;with family/visitor present Nurse Communication: Mobility status PT Visit Diagnosis: Muscle weakness (generalized) (M62.81);Difficulty in walking, not elsewhere classified (R26.2);Pain Pain - Right/Left: Right Pain - part of body: Hip     Time: 2952-8413 PT Time Calculation (min) (ACUTE ONLY): 28 min  Charges:  $Gait Training: 8-22 mins $Therapeutic Activity: 8-22 mins                    Mikel Cella, PTA    Ruben Green 08/04/2021, 5:17 PM

## 2021-08-04 NOTE — Discharge Summary (Addendum)
Physician Discharge Summary  Patient ID: Ruben Green MRN: [id number] DOB/AGE: April 14, 1934 88 y.o.  Admit date: 08/01/2021 Discharge date: 08/05/2021  Admission Diagnoses:  Status post total hip replacement, right [Z96.641] Status post hip replacement, right [Z96.641] Degenerative joint disease of the right hip.  Discharge Diagnoses: Patient Active Problem List   Diagnosis Date Noted   Status post hip replacement, right 08/04/2021   Status post total hip replacement, right 08/01/2021   Carcinoma in situ of bladder 03/05/2020   Prostate cancer ([place]) 07/03/2017   Endophthalmitis, acute, right 02/23/2017   Coronary artery disease involving native coronary artery of native heart without angina pectoris 05/30/2016   Benign fibroma of prostate 11/08/2015   Chronic obstructive pulmonary disease ([place]) 11/08/2015   Essential (primary) hypertension 11/08/2015   Combined fat and carbohydrate induced hyperlipemia 11/08/2015   Low serum cobalamin 04/05/2015   Elevated PSA 02/08/2015   BPH with obstruction/lower urinary tract symptoms 02/08/2015   Erectile dysfunction of organic origin 02/08/2015   Anxiety 08/24/2014   Abdominal aortic aneurysm (AAA) without rupture 05/20/2014   Carotid artery plaque 05/20/2014   Breathlessness on exertion 05/20/2014   Aneurysm of abdominal vessel 05/20/2014   Anxiety, generalized 04/20/2014   Benign prostatic hyperplasia with urinary obstruction 10/27/2012    Past Medical History:  Diagnosis Date   AAA (abdominal aortic aneurysm)    a.) s/p repair in 2005   Anemia    Anginal pain ([place])    Anxiety    Arthritis    B12 deficiency    Bilateral cataracts    a.) s/p BILATERAL extractions in 2018   BPH with obstruction/lower urinary tract symptoms    CAD (coronary artery disease)    Carotid artery stenosis    a.) s/p CEA on the RIGHT   COPD (chronic obstructive pulmonary disease) ([place])    Diastolic dysfunction 16/96/7893   a.)  TTE  07/29/2019: EF 50-55%; LA mildly enlarged; G1DD.   DOE (dyspnea on exertion)    Elevated PSA    Environmental and seasonal allergies    History of 2019 novel coronavirus disease (COVID-19)    History of kidney stones    HLD (hyperlipidemia)    HOH (hard of hearing)    HTN (hypertension)    Incomplete bladder emptying    Macular degeneration    Prostate cancer ([facility])    RBBB (right bundle branch block)    Valvular insufficiency    a.) TTE 07/29/2019: LVEF 50-55%; LA mild enlarged; triv AR/PR, mild MR, mod TR.     Transfusion: None.   Consultants (if any):   Discharged Condition: Improved  Hospital Course: Ruben Green is an 88 y.o. male who was admitted 08/01/2021 with a diagnosis of degenerative joint disease of the right hip and went to the operating room on 08/01/2021 and underwent the above named procedures.  The patient had a hemoglobin of 7.8 this morning.  It trended down from 8.1 yesterday.  The patient remained stable.   Surgeries: Procedure(s): TOTAL HIP ARTHROPLASTY on 08/01/2021 Patient tolerated the surgery well. Taken to PACU where she was stabilized and then transferred to the orthopedic floor.  Started on Eliquis 2.5mg  every 12 hrs. Foot pumps applied bilaterally at 80 mm. Heels elevated on bed with rolled towels. No evidence of DVT. Negative [place]. Physical therapy started on day #1 for gait training and transfer. OT started day #1 for ADL and assisted devices.  Patient was unable to void without foley in place.  In and Out performed on  several occasions.  Decision made to leave foley in for discharge after discussing with urology.  Will discharge home with foley and plan for follow-up with urologist next week.  Patient with hypoxia during admission.  CT chest negative for PE.  Inaccurate O2 measurements thru finger and ear.  When working with therapy he was able to perform well without O2.  He does not use O2 at home.  Will plan on discharge home without home O2  at this time.  Patient's IV was removed on POD3.  Implants: Biomet press-fit system with a #15 laterally offset Echo femoral stem, a 56 mm acetabular shell with an E-poly hi-wall liner, and a 36 mm ceramic head with a -3 mm neck.  He was given perioperative antibiotics:  Anti-infectives (From admission, onward)    Start     Dose/Rate Route Frequency Ordered Stop   08/01/21 1400  ceFAZolin (ANCEF) IVPB 2g/100 mL premix        2 g 200 mL/hr over 30 Minutes Intravenous Every 6 hours 08/01/21 1335 08/02/21 0451   08/01/21 0612  ceFAZolin (ANCEF) 2-4 GM/100ML-% IVPB       Note to Pharmacy: Maryagnes Amos B: cabinet override      08/01/21 0612 08/01/21 1443   08/01/21 0600  ceFAZolin (ANCEF) IVPB 2g/100 mL premix        2 g 200 mL/hr over 30 Minutes Intravenous On call to O.R. 08/01/21 0101 08/01/21 9470     .  He was given sequential compression devices, early ambulation, and Eliquis for DVT prophylaxis.  He benefited maximally from the hospital stay and there were no complications.    Recent vital signs:  Vitals:   08/04/21 1959 08/05/21 0416  BP: (!) 102/58 123/67  Pulse: (!) 105 (!) 101  Resp: 20 18  Temp: 98.3 F (36.8 C) 98.8 F (37.1 C)  SpO2: 92% 91%    Recent laboratory studies:  Lab Results  Component Value Date   HGB 7.8 (L) 08/04/2021   HGB 8.1 (L) 08/03/2021   HGB 8.3 (L) 08/02/2021   Lab Results  Component Value Date   WBC 10.9 (H) 08/04/2021   PLT 207 08/04/2021   No results found for: INR Lab Results  Component Value Date   NA 135 08/04/2021   K 4.8 08/04/2021   CL 105 08/04/2021   CO2 23 08/04/2021   BUN 32 (H) 08/04/2021   CREATININE 1.27 (H) 08/04/2021   GLUCOSE 136 (H) 08/04/2021    Discharge Medications:   Allergies as of 08/05/2021   No Known Allergies      Medication List     TAKE these medications    acetaminophen 500 MG tablet Commonly known as: TYLENOL Take 1-2 tablets (500-1,000 mg total) by mouth every 6 (six) hours  as needed for mild pain. What changed:  how much to take when to take this reasons to take this   albuterol 108 (90 Base) MCG/ACT inhaler Commonly known as: VENTOLIN HFA Inhale 2 puffs into the lungs every 6 (six) hours as needed for wheezing or shortness of breath.   amLODipine 10 MG tablet Commonly known as: NORVASC Take 10 mg by mouth daily.   apixaban 2.5 MG Tabs tablet Commonly known as: ELIQUIS Take 1 tablet (2.5 mg total) by mouth 2 (two) times daily.   atorvastatin 40 MG tablet Commonly known as: LIPITOR Take 40 mg by mouth daily.   finasteride 5 MG tablet Commonly known as: Proscar Take 1 tablet (5 mg total)  by mouth daily.   gabapentin 100 MG capsule Commonly known as: NEURONTIN Take 100 mg by mouth at bedtime as needed (pain).   iron polysaccharides 150 MG capsule Commonly known as: NIFEREX Take 150 mg by mouth daily.   LORazepam 0.5 MG tablet Commonly known as: ATIVAN Take 0.5 mg by mouth every 8 (eight) hours as needed for anxiety.   losartan 100 MG tablet Commonly known as: COZAAR Take 100 mg by mouth daily.   ondansetron 4 MG tablet Commonly known as: ZOFRAN Take 1 tablet (4 mg total) by mouth every 6 (six) hours as needed for nausea.   oxyCODONE 5 MG immediate release tablet Commonly known as: Oxy IR/ROXICODONE Take 0.5 tablets (2.5 mg total) by mouth every 4 (four) hours as needed for moderate pain (pain score 4-6).   tamsulosin 0.4 MG Caps capsule Commonly known as: FLOMAX Take 1 capsule (0.4 mg total) by mouth daily.   tiZANidine 4 MG tablet Commonly known as: Zanaflex Take 1 tablet (4 mg total) by mouth every 8 (eight) hours as needed for muscle spasms.   Trelegy Ellipta 100-62.5-25 MCG/ACT Aepb Generic drug: Fluticasone-Umeclidin-Vilant Inhale 2 puffs into the lungs daily.   vitamin B-12 1000 MCG tablet Commonly known as: CYANOCOBALAMIN Take 1,000 mcg by mouth daily.               Durable Medical Equipment  (From  admission, onward)           Start     Ordered   08/04/21 0928  For home use only DME oxygen  Once       Question Answer Comment  Length of Need 6 Months   Mode or (Route) Nasal cannula   Liters per Minute 2   Frequency Continuous (stationary and portable oxygen unit needed)   Oxygen delivery system Gas      08/04/21 0928   08/02/21 1008  For home use only DME Walker rolling  Once       Question Answer Comment  Walker: With [employer] Wheels   Patient needs a walker to treat with the following condition Weakness      08/02/21 1008   08/01/21 1545  DME Bedside commode  Once       Question:  Patient needs a bedside commode to treat with the following condition  Answer:  Status post total hip replacement, right   08/01/21 1544   08/01/21 1545  DME 3 n 1  Once        08/01/21 1544   08/01/21 1545  DME Walker rolling  Once       Question Answer Comment  Walker: With [employer]   Patient needs a walker to treat with the following condition Status post total hip replacement, right      08/01/21 1544           Diagnostic Studies: CT Angio Chest Pulmonary Embolism (PE) W or WO Contrast  Result Date: 08/03/2021 CLINICAL DATA:  Low oxygen saturations, status post right hip total arthroplasty EXAM: CT ANGIOGRAPHY CHEST WITH CONTRAST TECHNIQUE: Multidetector CT imaging of the chest was performed using the standard protocol during bolus administration of intravenous contrast. Multiplanar CT image reconstructions and MIPs were obtained to evaluate the vascular anatomy. RADIATION DOSE REDUCTION: This exam was performed according to the departmental dose-optimization program which includes automated exposure control, adjustment of the mA and/or kV according to patient size and/or use of iterative reconstruction technique. CONTRAST:  59mL OMNIPAQUE IOHEXOL 350 MG/ML SOLN  COMPARISON:  10/19/2016 FINDINGS: Cardiovascular: Satisfactory opacification of the pulmonary arteries to the segmental  level. No evidence of pulmonary embolism. Normal heart size. Three-vessel coronary artery calcifications. No pericardial effusion. Aortic atherosclerosis. Mediastinum/Nodes: No enlarged mediastinal, hilar, or axillary lymph nodes. Moderate hiatal hernia with intrathoracic position of the gastric fundus. Thyroid gland, trachea, and esophagus demonstrate no significant findings. Lungs/Pleura: Moderate centrilobular and paraseptal emphysema. Pulmonary hyperinflation. Dependent bibasilar atelectasis or consolidation with scant frothy debris present in the right lower lobe airways (series 6, image 58). No pleural effusion or pneumothorax. Upper Abdomen: No acute abnormality. Musculoskeletal: No chest wall abnormality. No acute osseous findings. Review of the MIP images confirms the above findings. IMPRESSION: 1. Negative examination for pulmonary embolism. 2. Dependent bibasilar atelectasis or consolidation with scant frothy debris present in the right lower lobe airways. Findings suggest aspiration. 3. Emphysema. 4. Coronary artery disease. 5. Hiatal hernia, which may place the patient at risk for aspiration. Aortic Atherosclerosis (ICD10-I70.0) and Emphysema (ICD10-J43.9). Electronically Signed   By: Delanna Ahmadi M.D.   On: 08/03/2021 08:25   US Venous Img Lower Unilateral Right (DVT)  Result Date: 07/17/2021 CLINICAL DATA:  Right lower extremity swelling for 2 weeks, pain, straight bladder and prostate cancer EXAM: RIGHT LOWER EXTREMITY VENOUS DOPPLER ULTRASOUND TECHNIQUE: Gray-scale sonography with compression, as well as color and duplex ultrasound, were performed to evaluate the deep venous system(s) from the level of the common femoral vein through the popliteal and proximal calf veins. COMPARISON:  None. FINDINGS: VENOUS Normal compressibility of the common femoral, superficial femoral, and popliteal veins, as well as the visualized calf veins. Visualized portions of profunda femoral vein and great saphenous  vein unremarkable. No filling defects to suggest DVT on grayscale or color Doppler imaging. Doppler waveforms show normal direction of venous flow, normal respiratory plasticity and response to augmentation. Limited views of the contralateral common femoral vein are unremarkable. OTHER None. Limitations: none IMPRESSION: 1. No evidence of deep venous thrombosis within the right lower extremity. Electronically Signed   By: Randa Ngo M.D.   On: 07/17/2021 16:17   DG Chest Port 1 View  Result Date: 08/03/2021 CLINICAL DATA:  Oxygen desaturation EXAM: PORTABLE CHEST 1 VIEW COMPARISON:  10/19/2016 FINDINGS: Bibasilar atelectasis. No focal airspace consolidation or pulmonary edema. Normal cardiomediastinal contours and pleural spaces. IMPRESSION: Bibasilar atelectasis. Electronically Signed   By: Ulyses Jarred M.D.   On: 08/03/2021 01:49   DG HIP UNILAT W OR W/O PELVIS 2-3 VIEWS RIGHT  Result Date: 08/01/2021 CLINICAL DATA:  A 19 male at age 23 presents for evaluation of RIGHT hip replacement. EXAM: DG HIP (WITH OR WITHOUT PELVIS) 2-3V RIGHT COMPARISON:  Previous CT from March 21, 2021. FINDINGS: Post RIGHT hip arthroplasty. Femoral and acetabular components appear well positioned. No unexpected radiographic findings or fracture is noted. Surgical staples overlie the lateral aspect of the RIGHT hip and there is evidence of gas in the soft tissues about the RIGHT hip. IMPRESSION: Post RIGHT hip arthroplasty without complicating features. Electronically Signed   By: Zetta Bills M.D.   On: 08/01/2021 10:26    Disposition: Plan for discharge home today pending progress with therapy today and working without O2.   Follow-up Information     Lattie Corns, PA-C Follow up in 14 day(s).   Specialty: Physician Assistant Why: Levert Feinstein removal Contact information: [place] [address] [phone]         Royston Cowper, MD. Call.   Specialty:  Urology Why: Follow-up next week for foley removal. Contact information: [place] [address] [phone]                Signed: Joesphine Bare 08/05/2021, 7:35 AM

## 2021-08-04 NOTE — Progress Notes (Signed)
OT Cancellation Note  Patient Details Name: Ruben Green MRN: [id number] DOB: March 31, 1934   Cancelled Treatment:    Reason Eval/Treat Not Completed: Other (comment). Pt working with PT upon attempt. Will re-attempt as able.   Ardeth Perfect., MPH, MS, OTR/L ascom [phone] 08/04/21, 4:00 PM

## 2021-08-04 NOTE — Progress Notes (Addendum)
Subjective: 3 Days Post-Op Procedure(s) (LRB): TOTAL HIP ARTHROPLASTY (Right) Patient reports pain as mild.   Patient is well, patient with hypoxia, currently on 3L of O2 this morning. Patient denies any chest pain or SOB.  He was able to perform well with PT yesterday without any O2.  Will monitor how he performs today. Chest CT negative for PE, did demonstrate some underlying atelectasis. Patient with urinary retention.  Foley has been placed.  Plan will be for him to discharge with the foley and follow-up with urologist, Dr. Rogers Green, for possible foley removal. Wife is with him in the room.  Provides interpretation for the patient. Plan is to go Home after hospital stay. Negative for chest pain and shortness of breath Fever: no Gastrointestinal:Negative for nausea and vomiting He has had a BM since surgery.   Objective: Vital signs in last 24 hours: Temp:  [97.8 F (36.6 C)-99 F (37.2 C)] 99 F (37.2 C) (02/17 0423) Pulse Rate:  [99-110] 102 (02/17 0423) Resp:  [16-20] 18 (02/17 0423) BP: (114-137)/(54-66) 121/63 (02/17 0423) SpO2:  [80 %-95 %] 92 % (02/17 0423)  Intake/Output from previous day:  Intake/Output Summary (Last 24 hours) at 08/04/2021 0750 Last data filed at 08/04/2021 0600 Gross per 24 hour  Intake 120 ml  Output 650 ml  Net -530 ml    Intake/Output this shift: No intake/output data recorded.  Labs: Recent Labs    08/02/21 0521 08/03/21 0619  HGB 8.3* 8.1*   Recent Labs    08/02/21 0521 08/03/21 0619  WBC 9.1 8.7  RBC 2.96* 2.90*  HCT 26.9* 25.8*  PLT 202 182   Recent Labs    08/03/21 0619 08/04/21 0600  NA 136 135  K 4.3 4.8  CL 105 105  CO2 23 23  BUN 33* 32*  CREATININE 1.22 1.27*  GLUCOSE 124* 136*  CALCIUM 8.3* 8.2*   No results for input(s): LABPT, INR in the last 72 hours.  EXAM General - Patient is Alert, Appropriate, and Oriented Chest- Patient with regular rhythm.  Slightly tachycardic. Extremity - ABD  soft Neurovascular intact Dorsiflexion/Plantar flexion intact Incision: scant drainage No cellulitis present Compartment soft Thigh is tender to palpation but soft. Negative Homan's to bilateral lower extremities. Dressing/Incision - Mild bloody drainage noted to the right hip. Motor Function - intact, moving foot and toes well on exam.  Abdomen with improved distention, intact bowel sounds this morning.  Past Medical History:  Diagnosis Date   AAA (abdominal aortic aneurysm)    a.) s/p repair in 2005   Anemia    Anginal pain ([place])    Anxiety    Arthritis    B12 deficiency    Bilateral cataracts    a.) s/p BILATERAL extractions in 2018   BPH with obstruction/lower urinary tract symptoms    CAD (coronary artery disease)    Carotid artery stenosis    a.) s/p CEA on the RIGHT   COPD (chronic obstructive pulmonary disease) (HCC)    Diastolic dysfunction 08/65/7846   a.)  TTE 07/29/2019: EF 50-55%; LA mildly enlarged; G1DD.   DOE (dyspnea on exertion)    Elevated PSA    Environmental and seasonal allergies    History of 2019 novel coronavirus disease (COVID-19)    History of kidney stones    HLD (hyperlipidemia)    HOH (hard of hearing)    HTN (hypertension)    Incomplete bladder emptying    Macular degeneration    Prostate cancer ([place])  RBBB (right bundle branch block)    Valvular insufficiency    a.) TTE 07/29/2019: LVEF 50-55%; LA mild enlarged; triv AR/PR, mild MR, mod TR.    Assessment/Plan: 3 Days Post-Op Procedure(s) (LRB): TOTAL HIP ARTHROPLASTY (Right) Principal Problem:   Status post total hip replacement, right  Estimated body mass index is 31.09 kg/m as calculated from the following:   Height as of this encounter: 5\' 2"  (1.575 m).   Weight as of this encounter: 77.1 kg. Advance diet Up with therapy   Labs reviewed this AM CBC has been ordered. Foley to remain in for discharge, follow-up with Dr. Rogers Green, urology next week.  Will place a referral for  outpatient follow-up for the patient. Patient with hypoxia however inaccurate O2 measurements.  He was able to perform therapy without O2 yesterday, does not use O2 at home.  Spoke with PT will have him work with therapy today.  If he is able to perform well without O2 I will plan on discharging home without O2.  HR 102 this AM.  Will continue to monitor. He has had a BM since surgery.  Plan for discharge home today pending progress with therapy.  DVT Prophylaxis -  SCDs, Eliquis Weight-Bearing as tolerated to right leg  Addendum 9:00 AM Patient was able to ambulate with therapy however O2 was around 87-89% on room air when working with therapy.  Will plan on obtaining home oxygen for the patient.  He has a pulse Ox reader at home, will plan on him using 1L on O2 if it drops below 90% on room air.  He does have a history of COPD but has not had to use O2 in the past.  Will work with PT this afternoon, will hopefully discharge home today still.  Ruben Kashonda Sarkisyan, PA-C [facility]opa[facility] 08/04/2021, 7:50 AM

## 2021-08-05 NOTE — Plan of Care (Signed)

## 2021-08-05 NOTE — Progress Notes (Signed)
Subjective: 4 Days Post-Op Procedure(s) (LRB): TOTAL HIP ARTHROPLASTY (Right) Patient reports pain as mild.   Patient is well, patient with hypoxia, currently on 2L of O2 this morning. Patient denies any chest pain or SOB.  He was able to perform well with PT yesterday without any O2.  Will monitor how he performs today. Chest CT negative for PE, did demonstrate some underlying atelectasis. Patient with urinary retention.  Foley has been placed.  Plan will be for him to discharge with the foley and follow-up with urologist, Dr. Rogers Blocker, for possible foley removal. Wife is with him in the room.  Provides interpretation for the patient. Plan is to go Home after hospital stay. Negative for chest pain and shortness of breath Fever: no Gastrointestinal:Negative for nausea and vomiting He has had a BM since surgery.   Objective: Vital signs in last 24 hours: Temp:  [97.5 F (36.4 C)-98.8 F (37.1 C)] 98.8 F (37.1 C) (02/18 0416) Pulse Rate:  [99-107] 101 (02/18 0416) Resp:  [18-20] 18 (02/18 0416) BP: (94-123)/(56-73) 123/67 (02/18 0416) SpO2:  [90 %-95 %] 91 % (02/18 0416)  Intake/Output from previous day:  Intake/Output Summary (Last 24 hours) at 08/05/2021 0732 Last data filed at 08/05/2021 0402 Gross per 24 hour  Intake 743.57 ml  Output 800 ml  Net -56.43 ml    Intake/Output this shift: No intake/output data recorded.  Labs: Recent Labs    08/03/21 0619 08/04/21 0600  HGB 8.1* 7.8*   Recent Labs    08/03/21 0619 08/04/21 0600  WBC 8.7 10.9*  RBC 2.90* 2.84*  HCT 25.8* 25.3*  PLT 182 207   Recent Labs    08/03/21 0619 08/04/21 0600  NA 136 135  K 4.3 4.8  CL 105 105  CO2 23 23  BUN 33* 32*  CREATININE 1.22 1.27*  GLUCOSE 124* 136*  CALCIUM 8.3* 8.2*   No results for input(s): LABPT, INR in the last 72 hours.  EXAM General - Patient is Alert, Appropriate, and Oriented Chest- Patient with regular rhythm.  Slightly tachycardic. Extremity - ABD  soft Neurovascular intact Dorsiflexion/Plantar flexion intact Incision: scant drainage No cellulitis present Compartment soft Thigh is tender to palpation but soft. Negative Homan's to bilateral lower extremities. Dressing/Incision - Mild bloody drainage noted to the right hip. Motor Function - intact, moving foot and toes well on exam.  Abdomen with improved distention, intact bowel sounds this morning.  Past Medical History:  Diagnosis Date   AAA (abdominal aortic aneurysm)    a.) s/p repair in 2005   Anemia    Anginal pain ([place])    Anxiety    Arthritis    B12 deficiency    Bilateral cataracts    a.) s/p BILATERAL extractions in 2018   BPH with obstruction/lower urinary tract symptoms    CAD (coronary artery disease)    Carotid artery stenosis    a.) s/p CEA on the RIGHT   COPD (chronic obstructive pulmonary disease) (HCC)    Diastolic dysfunction 37/16/9678   a.)  TTE 07/29/2019: EF 50-55%; LA mildly enlarged; G1DD.   DOE (dyspnea on exertion)    Elevated PSA    Environmental and seasonal allergies    History of 2019 novel coronavirus disease (COVID-19)    History of kidney stones    HLD (hyperlipidemia)    HOH (hard of hearing)    HTN (hypertension)    Incomplete bladder emptying    Macular degeneration    Prostate cancer ([place])  RBBB (right bundle branch block)    Valvular insufficiency    a.) TTE 07/29/2019: LVEF 50-55%; LA mild enlarged; triv AR/PR, mild MR, mod TR.    Assessment/Plan: 4 Days Post-Op Procedure(s) (LRB): TOTAL HIP ARTHROPLASTY (Right) Principal Problem:   Status post total hip replacement, right Active Problems:   Status post hip replacement, right  Estimated body mass index is 31.09 kg/m as calculated from the following:   Height as of this encounter: 5\' 2"  (1.575 m).   Weight as of this encounter: 77.1 kg. Advance diet Up with therapy   Labs reviewed this AM hemoglobin 7.8.  Trending from 8.1 yesterday. Foley to remain in for  discharge, follow-up with Dr. Rogers Blocker, urology next week.  Will place a referral for outpatient follow-up for the patient. Patient with hypoxia however inaccurate O2 measurements.  He was able to perform therapy without O2 yesterday, does not use O2 at home.  Spoke with PT will have him work with therapy today.  If he is able to perform well without O2 I will plan on discharging home without O2.  HR 102 this AM.  Will continue to monitor. He has had a BM since surgery.  Plan for discharge home today pending progress with therapy.  DVT Prophylaxis -  SCDs, Eliquis Weight-Bearing as tolerated to right leg   Reche Dixon, PA-C [facility]opa[facility] 08/05/2021, 7:32 AM

## 2021-08-05 NOTE — Progress Notes (Signed)
Physical Therapy Treatment Patient Details Name: Ruben Green MRN: [id number] DOB: 1934-05-20 Today's Date: 08/05/2021   History of Present Illness Pt. is an 88 y.o. male wh was admitted for R THR 2/14. PMH includes prostate cancer, COPD, HLD, HTN.    PT Comments    Pt was long sitting in bed with family in room. He agrees to PT session and is cooperative though. Pt still endorsing pain however it did not limit his abilities. He continues to demonstrate safe enough abilities to safely DC home with family. Son and spouse present throughout session. All parties were educated on expectations and safety concerns with up coming DC. Discussed pt's need for home o2 and assistance required. Son and pt feel confident in abilities. Is cleared from an acute PT standpoint for safe DC home.    Recommendations for follow up therapy are one component of a multi-disciplinary discharge planning process, led by the attending physician.  Recommendations may be updated based on patient status, additional functional criteria and insurance authorization.  Follow Up Recommendations  Home health PT     Assistance Recommended at Discharge Frequent or constant Supervision/Assistance  Patient can return home with the following A little help with walking and/or transfers;A little help with bathing/dressing/bathroom;Assist for transportation;Help with stairs or ramp for entrance   Equipment Recommendations  None recommended by PT       Precautions / Restrictions Precautions Precautions: Posterior Hip;Fall Precaution Booklet Issued: Yes (comment) Restrictions Weight Bearing Restrictions: Yes RLE Weight Bearing: Weight bearing as tolerated     Mobility  Bed Mobility Overal bed mobility: Needs Assistance Bed Mobility: Supine to Sit     Supine to sit: Min assist Sit to supine: Supervision        Transfers Overall transfer level: Needs assistance Equipment used: Rolling walker (2  wheels) Transfers: Sit to/from Stand Sit to Stand: Supervision      Ambulation/Gait Ambulation/Gait assistance: Supervision Gait Distance (Feet): 120 Feet Assistive device: Rolling walker (2 wheels) Gait Pattern/deviations: Step-through pattern, Antalgic Gait velocity: decreased     General Gait Details: Pt was easily and safely able to ambulate 120 ft to    Balance Overall balance assessment: Needs assistance Sitting-balance support: Feet supported Sitting balance-Leahy Scale: Good     Standing balance support: Bilateral upper extremity supported, During functional activity, Reliant on assistive device for balance Standing balance-Leahy Scale: Good       Cognition Arousal/Alertness: Awake/alert Behavior During Therapy: WFL for tasks assessed/performed Overall Cognitive Status: Within Functional Limits for tasks assessed      General Comments: Pt is A and O x 4               Pertinent Vitals/Pain Pain Assessment Pain Assessment: 0-10 Pain Score: 8  Pain Location: Right Hip Pain Descriptors / Indicators: Operative site guarding Pain Intervention(s): Limited activity within patient's tolerance, Monitored during session, Premedicated before session, Repositioned, Ice applied     PT Goals (current goals can now be found in the care plan section) Acute Rehab PT Goals Patient Stated Goal: less pain Progress towards PT goals: Progressing toward goals    Frequency    BID      PT Plan Current plan remains appropriate       AM-PAC PT "6 Clicks" Mobility   Outcome Measure  Help needed turning from your back to your side while in a flat bed without using bedrails?: A Little Help needed moving from lying on your back to sitting on the side of  a flat bed without using bedrails?: A Little Help needed moving to and from a bed to a chair (including a wheelchair)?: A Little Help needed standing up from a chair using your arms (e.g., wheelchair or bedside chair)?:  A Little Help needed to walk in hospital room?: A Little Help needed climbing 3-5 steps with a railing? : A Little 6 Click Score: 18    End of Session Equipment Utilized During Treatment: Gait belt Activity Tolerance: Patient tolerated treatment well Patient left: in bed;with call bell/phone within reach;with bed alarm set;with nursing/sitter in room;with family/visitor present Nurse Communication: Mobility status PT Visit Diagnosis: Muscle weakness (generalized) (M62.81);Difficulty in walking, not elsewhere classified (R26.2);Pain Pain - Right/Left: Right Pain - part of body: Hip     Time: 8889-1694 PT Time Calculation (min) (ACUTE ONLY): 43 min  Charges:  $Gait Training: 8-22 mins $Therapeutic Activity: 8-22 mins                     Julaine Fusi PTA 08/05/21, 12:27 PM

## 2021-08-08 ENCOUNTER — Inpatient Hospital Stay
Admission: EM | Admit: 2021-08-08 | Discharge: 2021-08-12 | DRG: 871 | Disposition: A | Discharge status: Home Health | Payer: PPO | Attending: Internal Medicine | Admitting: Internal Medicine

## 2021-08-08 ENCOUNTER — Other Ambulatory Visit: Payer: Self-pay

## 2021-08-08 ENCOUNTER — Emergency Department: Payer: PPO

## 2021-08-08 DIAGNOSIS — N390 Urinary tract infection, site not specified: Secondary | ICD-10-CM | POA: Diagnosis not present

## 2021-08-08 DIAGNOSIS — J9601 Acute respiratory failure with hypoxia: Secondary | ICD-10-CM | POA: Diagnosis not present

## 2021-08-08 DIAGNOSIS — I248 Other forms of acute ischemic heart disease: Secondary | ICD-10-CM | POA: Diagnosis present

## 2021-08-08 DIAGNOSIS — I10 Essential (primary) hypertension: Secondary | ICD-10-CM | POA: Diagnosis present

## 2021-08-08 DIAGNOSIS — M1990 Unspecified osteoarthritis, unspecified site: Secondary | ICD-10-CM | POA: Diagnosis present

## 2021-08-08 DIAGNOSIS — Z8546 Personal history of malignant neoplasm of prostate: Secondary | ICD-10-CM

## 2021-08-08 DIAGNOSIS — J441 Chronic obstructive pulmonary disease with (acute) exacerbation: Secondary | ICD-10-CM | POA: Diagnosis present

## 2021-08-08 DIAGNOSIS — Z87891 Personal history of nicotine dependence: Secondary | ICD-10-CM

## 2021-08-08 DIAGNOSIS — I110 Hypertensive heart disease with heart failure: Secondary | ICD-10-CM | POA: Diagnosis not present

## 2021-08-08 DIAGNOSIS — D649 Anemia, unspecified: Secondary | ICD-10-CM

## 2021-08-08 DIAGNOSIS — C61 Malignant neoplasm of prostate: Secondary | ICD-10-CM | POA: Diagnosis present

## 2021-08-08 DIAGNOSIS — I2510 Atherosclerotic heart disease of native coronary artery without angina pectoris: Secondary | ICD-10-CM | POA: Diagnosis present

## 2021-08-08 DIAGNOSIS — R6520 Severe sepsis without septic shock: Secondary | ICD-10-CM | POA: Diagnosis present

## 2021-08-08 DIAGNOSIS — J188 Other pneumonia, unspecified organism: Secondary | ICD-10-CM

## 2021-08-08 DIAGNOSIS — I214 Non-ST elevation (NSTEMI) myocardial infarction: Secondary | ICD-10-CM | POA: Diagnosis not present

## 2021-08-08 DIAGNOSIS — I517 Cardiomegaly: Secondary | ICD-10-CM | POA: Diagnosis not present

## 2021-08-08 DIAGNOSIS — E538 Deficiency of other specified B group vitamins: Secondary | ICD-10-CM | POA: Diagnosis not present

## 2021-08-08 DIAGNOSIS — N179 Acute kidney failure, unspecified: Secondary | ICD-10-CM | POA: Diagnosis not present

## 2021-08-08 DIAGNOSIS — J189 Pneumonia, unspecified organism: Secondary | ICD-10-CM | POA: Diagnosis not present

## 2021-08-08 DIAGNOSIS — I5032 Chronic diastolic (congestive) heart failure: Secondary | ICD-10-CM | POA: Diagnosis not present

## 2021-08-08 DIAGNOSIS — J9811 Atelectasis: Secondary | ICD-10-CM | POA: Diagnosis not present

## 2021-08-08 DIAGNOSIS — E872 Acidosis, unspecified: Secondary | ICD-10-CM | POA: Diagnosis not present

## 2021-08-08 DIAGNOSIS — R319 Hematuria, unspecified: Secondary | ICD-10-CM | POA: Diagnosis not present

## 2021-08-08 DIAGNOSIS — N401 Enlarged prostate with lower urinary tract symptoms: Secondary | ICD-10-CM | POA: Diagnosis not present

## 2021-08-08 DIAGNOSIS — J690 Pneumonitis due to inhalation of food and vomit: Secondary | ICD-10-CM | POA: Diagnosis present

## 2021-08-08 DIAGNOSIS — Z96641 Presence of right artificial hip joint: Secondary | ICD-10-CM | POA: Diagnosis not present

## 2021-08-08 DIAGNOSIS — F419 Anxiety disorder, unspecified: Secondary | ICD-10-CM | POA: Diagnosis not present

## 2021-08-08 DIAGNOSIS — J302 Other seasonal allergic rhinitis: Secondary | ICD-10-CM | POA: Diagnosis present

## 2021-08-08 DIAGNOSIS — E785 Hyperlipidemia, unspecified: Secondary | ICD-10-CM | POA: Diagnosis present

## 2021-08-08 DIAGNOSIS — Z8616 Personal history of COVID-19: Secondary | ICD-10-CM | POA: Diagnosis not present

## 2021-08-08 DIAGNOSIS — R001 Bradycardia, unspecified: Secondary | ICD-10-CM | POA: Diagnosis not present

## 2021-08-08 DIAGNOSIS — Z7951 Long term (current) use of inhaled steroids: Secondary | ICD-10-CM

## 2021-08-08 DIAGNOSIS — Z79899 Other long term (current) drug therapy: Secondary | ICD-10-CM

## 2021-08-08 DIAGNOSIS — J439 Emphysema, unspecified: Secondary | ICD-10-CM | POA: Diagnosis not present

## 2021-08-08 DIAGNOSIS — I959 Hypotension, unspecified: Secondary | ICD-10-CM

## 2021-08-08 DIAGNOSIS — I700 Atherosclerosis of aorta: Secondary | ICD-10-CM | POA: Diagnosis not present

## 2021-08-08 DIAGNOSIS — N138 Other obstructive and reflux uropathy: Secondary | ICD-10-CM | POA: Diagnosis not present

## 2021-08-08 DIAGNOSIS — J449 Chronic obstructive pulmonary disease, unspecified: Secondary | ICD-10-CM | POA: Diagnosis not present

## 2021-08-08 DIAGNOSIS — J90 Pleural effusion, not elsewhere classified: Secondary | ICD-10-CM | POA: Diagnosis not present

## 2021-08-08 DIAGNOSIS — J9621 Acute and chronic respiratory failure with hypoxia: Secondary | ICD-10-CM | POA: Diagnosis not present

## 2021-08-08 DIAGNOSIS — H9190 Unspecified hearing loss, unspecified ear: Secondary | ICD-10-CM | POA: Diagnosis present

## 2021-08-08 DIAGNOSIS — R739 Hyperglycemia, unspecified: Secondary | ICD-10-CM | POA: Diagnosis not present

## 2021-08-08 DIAGNOSIS — K449 Diaphragmatic hernia without obstruction or gangrene: Secondary | ICD-10-CM | POA: Diagnosis present

## 2021-08-08 DIAGNOSIS — R0902 Hypoxemia: Secondary | ICD-10-CM

## 2021-08-08 DIAGNOSIS — A419 Sepsis, unspecified organism: Principal | ICD-10-CM

## 2021-08-08 DIAGNOSIS — R42 Dizziness and giddiness: Secondary | ICD-10-CM | POA: Diagnosis not present

## 2021-08-08 DIAGNOSIS — E871 Hypo-osmolality and hyponatremia: Secondary | ICD-10-CM | POA: Diagnosis not present

## 2021-08-08 DIAGNOSIS — R7989 Other specified abnormal findings of blood chemistry: Secondary | ICD-10-CM | POA: Diagnosis present

## 2021-08-08 DIAGNOSIS — Z20822 Contact with and (suspected) exposure to covid-19: Secondary | ICD-10-CM | POA: Diagnosis present

## 2021-08-08 DIAGNOSIS — I714 Abdominal aortic aneurysm, without rupture, unspecified: Secondary | ICD-10-CM | POA: Diagnosis present

## 2021-08-08 DIAGNOSIS — B964 Proteus (mirabilis) (morganii) as the cause of diseases classified elsewhere: Secondary | ICD-10-CM

## 2021-08-08 DIAGNOSIS — M1611 Unilateral primary osteoarthritis, right hip: Secondary | ICD-10-CM | POA: Diagnosis not present

## 2021-08-08 DIAGNOSIS — R778 Other specified abnormalities of plasma proteins: Secondary | ICD-10-CM | POA: Diagnosis present

## 2021-08-08 DIAGNOSIS — Z7901 Long term (current) use of anticoagulants: Secondary | ICD-10-CM

## 2021-08-08 LAB — URINALYSIS, ROUTINE W REFLEX MICROSCOPIC
Bilirubin Urine: NEGATIVE
Glucose, UA: NEGATIVE mg/dL
Ketones, ur: NEGATIVE mg/dL
Nitrite: NEGATIVE
Protein, ur: NEGATIVE mg/dL
Specific Gravity, Urine: 1.01 (ref 1.005–1.030)
pH: 5 (ref 5.0–8.0)

## 2021-08-08 LAB — CBC WITH DIFFERENTIAL/PLATELET
Abs Immature Granulocytes: 0.31 10*3/uL — ABNORMAL HIGH (ref 0.00–0.07)
Basophils Absolute: 0 10*3/uL (ref 0.0–0.1)
Basophils Relative: 0 %
Eosinophils Absolute: 0 10*3/uL (ref 0.0–0.5)
Eosinophils Relative: 0 %
HCT: 24.7 % — ABNORMAL LOW (ref 39.0–52.0)
Hemoglobin: 7.4 g/dL — ABNORMAL LOW (ref 13.0–17.0)
Immature Granulocytes: 2 %
Lymphocytes Relative: 3 %
Lymphs Abs: 0.4 10*3/uL — ABNORMAL LOW (ref 0.7–4.0)
MCH: 27 pg (ref 26.0–34.0)
MCHC: 30 g/dL (ref 30.0–36.0)
MCV: 90.1 fL (ref 80.0–100.0)
Monocytes Absolute: 1 10*3/uL (ref 0.1–1.0)
Monocytes Relative: 7 %
Neutro Abs: 12.1 10*3/uL — ABNORMAL HIGH (ref 1.7–7.7)
Neutrophils Relative %: 88 %
Platelets: 325 10*3/uL (ref 150–400)
RBC: 2.74 MIL/uL — ABNORMAL LOW (ref 4.22–5.81)
RDW: 16.3 % — ABNORMAL HIGH (ref 11.5–15.5)
WBC: 13.8 10*3/uL — ABNORMAL HIGH (ref 4.0–10.5)
nRBC: 0 % (ref 0.0–0.2)

## 2021-08-08 LAB — D-DIMER, QUANTITATIVE: D-Dimer, Quant: 2.71 ug/mL-FEU — ABNORMAL HIGH (ref 0.00–0.50)

## 2021-08-08 LAB — RESP PANEL BY RT-PCR (FLU A&B, COVID) ARPGX2
Influenza A by PCR: NEGATIVE
Influenza B by PCR: NEGATIVE
SARS Coronavirus 2 by RT PCR: NEGATIVE

## 2021-08-08 LAB — COMPREHENSIVE METABOLIC PANEL
ALT: 13 U/L (ref 0–44)
AST: 22 U/L (ref 15–41)
Albumin: 2.5 g/dL — ABNORMAL LOW (ref 3.5–5.0)
Alkaline Phosphatase: 69 U/L (ref 38–126)
Anion gap: 8 (ref 5–15)
BUN: 36 mg/dL — ABNORMAL HIGH (ref 8–23)
CO2: 22 mmol/L (ref 22–32)
Calcium: 7.8 mg/dL — ABNORMAL LOW (ref 8.9–10.3)
Chloride: 99 mmol/L (ref 98–111)
Creatinine, Ser: 1.35 mg/dL — ABNORMAL HIGH (ref 0.61–1.24)
GFR, Estimated: 51 mL/min — ABNORMAL LOW (ref >60)
Glucose, Bld: 204 mg/dL — ABNORMAL HIGH (ref 70–99)
Potassium: 4.7 mmol/L (ref 3.5–5.1)
Sodium: 129 mmol/L — ABNORMAL LOW (ref 135–145)
Total Bilirubin: 0.5 mg/dL (ref 0.3–1.2)
Total Protein: 6.3 g/dL — ABNORMAL LOW (ref 6.5–8.1)

## 2021-08-08 LAB — LACTIC ACID, PLASMA: Lactic Acid, Venous: 2.5 mmol/L (ref 0.5–1.9)

## 2021-08-08 LAB — TROPONIN I (HIGH SENSITIVITY): Troponin I (High Sensitivity): 48 ng/L — ABNORMAL HIGH (ref <18)

## 2021-08-08 MED ORDER — LACTATED RINGERS IV SOLN: INTRAVENOUS | Status: DC

## 2021-08-08 MED ORDER — SODIUM CHLORIDE 0.9 % IV SOLN: 2.0000 g | INTRAVENOUS | Status: DC

## 2021-08-08 MED ORDER — SODIUM CHLORIDE 0.9 % IV SOLN
1.0000 g | Freq: Once | INTRAVENOUS | Status: AC
  Administered 2021-08-08: 1 g via INTRAVENOUS
  Filled 2021-08-08: qty 1

## 2021-08-08 MED ORDER — PIPERACILLIN-TAZOBACTAM 3.375 G IVPB
3.3750 g | Freq: Three times a day (TID) | INTRAVENOUS | Status: DC
Start: 2021-08-09 — End: 2021-08-11
  Administered 2021-08-09 – 2021-08-11 (×7): 3.375 g via INTRAVENOUS
  Filled 2021-08-08 (×7): qty 50

## 2021-08-08 MED ORDER — LACTATED RINGERS IV BOLUS (SEPSIS): 500.0000 mL | Freq: Once | INTRAVENOUS | Status: DC

## 2021-08-08 MED ORDER — ACETAMINOPHEN 500 MG PO TABS
1000.0000 mg | ORAL_TABLET | Freq: Once | ORAL | Status: AC
  Administered 2021-08-08: 1000 mg via ORAL
  Filled 2021-08-08: qty 2

## 2021-08-08 MED ORDER — LACTATED RINGERS IV BOLUS (SEPSIS)
1000.0000 mL | Freq: Once | INTRAVENOUS | Status: AC
  Administered 2021-08-08: 1000 mL via INTRAVENOUS

## 2021-08-08 MED ORDER — ONDANSETRON HCL 4 MG/2ML IJ SOLN: 4.0000 mg | Freq: Four times a day (QID) | INTRAMUSCULAR | Status: DC | PRN

## 2021-08-08 MED ORDER — ACETAMINOPHEN 650 MG RE SUPP: 650.0000 mg | Freq: Four times a day (QID) | RECTAL | Status: DC | PRN

## 2021-08-08 MED ORDER — ONDANSETRON HCL 4 MG PO TABS: 4.0000 mg | ORAL_TABLET | Freq: Four times a day (QID) | ORAL | Status: DC | PRN

## 2021-08-08 MED ORDER — IOHEXOL 350 MG/ML SOLN
100.0000 mL | Freq: Once | INTRAVENOUS | Status: AC | PRN
  Administered 2021-08-08: 100 mL via INTRAVENOUS

## 2021-08-08 MED ORDER — ENOXAPARIN SODIUM 40 MG/0.4ML IJ SOSY
40.0000 mg | PREFILLED_SYRINGE | INTRAMUSCULAR | Status: DC
  Administered 2021-08-09: 40 mg via SUBCUTANEOUS
  Filled 2021-08-08: qty 0.4

## 2021-08-08 MED ORDER — ACETAMINOPHEN 325 MG PO TABS
650.0000 mg | ORAL_TABLET | Freq: Four times a day (QID) | ORAL | Status: DC | PRN
Start: 2021-08-08 — End: 2021-08-12
  Administered 2021-08-09 – 2021-08-12 (×8): 650 mg via ORAL
  Filled 2021-08-08 (×8): qty 2

## 2021-08-08 MED ORDER — SODIUM CHLORIDE 0.9 % IV SOLN
500.0000 mg | Freq: Once | INTRAVENOUS | Status: AC
  Administered 2021-08-08: 500 mg via INTRAVENOUS
  Filled 2021-08-08: qty 5

## 2021-08-08 NOTE — Assessment & Plan Note (Signed)
No acute issues suspected at this time 

## 2021-08-08 NOTE — Assessment & Plan Note (Addendum)
No chest pain.  Mildly elevated troponin with a downward trend, most likely secondary to demand. -Continue home dose of statin

## 2021-08-08 NOTE — Assessment & Plan Note (Addendum)
No acute issues suspected Surgical site appears well-healing.

## 2021-08-08 NOTE — ED Notes (Signed)
MD notified of low oxygen saturation and said to bump oxygen to 6L.

## 2021-08-08 NOTE — Assessment & Plan Note (Signed)
Not acutely exacerbated DuoNebs as needed 

## 2021-08-08 NOTE — Assessment & Plan Note (Addendum)
Blood pressure within goal now. -Keep holding home amlodipine and losartan-we will restart as needed

## 2021-08-08 NOTE — Assessment & Plan Note (Addendum)
Some concern for aspiration pneumonia given fluid-filled esophagus seen on CT, he was started on Zosyn. -Check procalcitonin -Continue with Zosyn -Incentive spirometer -Antitussives -Supplemental oxygen -DuoNebs as needed -Maintain aspiration precautions -Start him on diet once swallow evaluation completed.

## 2021-08-08 NOTE — Assessment & Plan Note (Addendum)
Hemoglobin trending down from recent hospitalization Continue to monitor CBC Latest Ref Rng & Units 08/08/2021 08/04/2021 08/03/2021  WBC 4.0 - 10.5 K/uL 13.8(H) 10.9(H) 8.7  Hemoglobin 13.0 - 17.0 g/dL 7.4(L) 7.8(L) 8.1(L)  Hematocrit 39.0 - 52.0 % 24.7(L) 25.3(L) 25.8(L)  Platelets 150 - 400 K/uL 325 207 182   -Continue home iron and B12 supplement

## 2021-08-08 NOTE — Assessment & Plan Note (Addendum)
Tachypnea with O2 sat 86% on room air. Initially requiring 3 L of oxygen, he was placed on BiPAP transiently in ED due to worsening respiratory status.  There was some concern of CHF due to elevated BNP at 259 which is not very conclusive. Echocardiogram was ordered-pending. CTA with concern of emphysema although no baseline oxygen requirement.  Apparently developed hypoxia after hip replacement surgery and discharged home on 3 L of oxygen. -Stop IV fluid -Continue supplemental oxygen-wean as tolerated.

## 2021-08-08 NOTE — Progress Notes (Signed)
CODE SEPSIS - PHARMACY COMMUNICATION  **Broad Spectrum Antibiotics should be administered within 1 hour of Sepsis diagnosis**  Time Code Sepsis Called/Page Received: 2300  Antibiotics Ordered: Cefepime, Azithromycin  Time of 1st antibiotic administration: 2337  Renda Rolls, PharmD, [facility] 08/08/2021 11:29 PM

## 2021-08-08 NOTE — ED Triage Notes (Signed)
Pt came from home pt called out for dizziness and hypotension, pt initial bp 60's/40s on arrival. Then after standing pt bp to 80's/50's. Pt had right hip surgery last week the 14th. Pt initial oxygen was 88% pt on 3L o2 now 97%. Pt cbg 237 from ems. Pt has family in the room that translate greek for the pt.

## 2021-08-08 NOTE — Assessment & Plan Note (Signed)
No acute issues.

## 2021-08-08 NOTE — Assessment & Plan Note (Signed)
Since 2/14 for postoperative DVT prophylaxis We will give subcu Lovenox prophylaxis instead

## 2021-08-08 NOTE — H&P (Signed)
History and Physical    Patient: Ruben Green IOE:[id number] DOB: Dec 25, 1933 DOA: 08/08/2021 DOS: the patient was seen and examined on 08/08/2021 PCP: Tracie Harrier, MD  Patient coming from: Home  Chief Complaint:  Chief Complaint  Patient presents with   Hypotension   hypoxia    HPI: Ruben Green is a 88 y.o. male with medical history significant of CAD, COPD, HTN, prostate cancer, AAA, who is s/p right hip replacement on 1/82, complicated by postoperative hypoxia, ruled out for PE with CTA chest, also with finding of postoperative anemia of 7.8, which remained stable for which she did not receive transfusions, and discharged in stable condition, and who is currently on Eliquis for DVT prophylaxis postop, who presents to the ED with a complaint of lightheadedness, sore throat and cough and cold symptoms.  He denies fever or chills, chest pain, vomiting or diarrhea or abdominal pain.  EMS reports a blood pressure of 60/40 and O2 sat 88% on room air requiring 3 L with improvement to 97%.   ED course: By arrival, BP was 102/57.  Afebrile, pulse 75, respirations 22-25 with O2 sat of 100% on 3 L Blood work: WBC 13,800 with lactic acid 2.5 Troponin 48, BNP pending, D-dimer 2.71 Hemoglobin 7.4, down from 7.8 on 2/17 Creatinine 1.35, up from 1.16 a week ago Sodium 129 Glucose 204 Urinalysis with large leukocyte esterase and few bacteria COVID and flu negative  EKG, personally viewed and interpreted: Sinus at 90 with RBBB with no acute ST-T wave changes  Imaging: CTA chest negative for PE, trace bilateral pleural effusions right greater than left, associated passive atelectasis of bilateral lower lobes with superimposed infection/inflammation not excluded Moderate volume hiatal hernia with associated air-fluid level within the mid esophagus and emphysema  Patient started on sepsis fluid bolus as well as cefepime and azithromycin.  Hospitalist consulted for admission.   Soon  after decision to admit, patient developed acute worsening of shortness of breath.  IV fluids were discontinued and patient was placed on BiPAP for possible fluid overload    Review of Systems: As mentioned in the history of present illness. All other systems reviewed and are negative. Past Medical History:  Diagnosis Date   AAA (abdominal aortic aneurysm)    a.) s/p repair in 2005   Anemia    Anginal pain ([place])    Anxiety    Arthritis    B12 deficiency    Bilateral cataracts    a.) s/p BILATERAL extractions in 2018   BPH with obstruction/lower urinary tract symptoms    CAD (coronary artery disease)    Carotid artery stenosis    a.) s/p CEA on the RIGHT   COPD (chronic obstructive pulmonary disease) (HCC)    Diastolic dysfunction 99/37/1696   a.)  TTE 07/29/2019: EF 50-55%; LA mildly enlarged; G1DD.   DOE (dyspnea on exertion)    Elevated PSA    Environmental and seasonal allergies    History of 2019 novel coronavirus disease (COVID-19)    History of kidney stones    HLD (hyperlipidemia)    HOH (hard of hearing)    HTN (hypertension)    Incomplete bladder emptying    Macular degeneration    Prostate cancer (HCC)    RBBB (right bundle branch block)    Valvular insufficiency    a.) TTE 07/29/2019: LVEF 50-55%; LA mild enlarged; triv AR/PR, mild MR, mod TR.   Past Surgical History:  Procedure Laterality Date   ABDOMINAL AORTIC ANEURYSM REPAIR  CATARACT EXTRACTION W/PHACO Right 09/25/2016   Procedure: CATARACT EXTRACTION PHACO AND INTRAOCULAR LENS PLACEMENT (IOC);  Surgeon: Birder Robson, MD;  Location: [facility];  Service: Ophthalmology;  Laterality: Right;  [place] 01:01 AP% 17.4 CDE 10.75 Fluid pack lot # [id number]   CATARACT EXTRACTION W/PHACO Left 10/16/2016   Procedure: CATARACT EXTRACTION PHACO AND INTRAOCULAR LENS PLACEMENT ([place]) Suture placed in Left eye;  Surgeon: Birder Robson, MD;  Location: [facility];  Service: Ophthalmology;  Laterality: Left;  [place] 2:06.8 AP%  22.2 CDE 28.17 Fluid pack lot # [id number]   COLONOSCOPY WITH PROPOFOL N/A 03/31/2019   Procedure: COLONOSCOPY WITH PROPOFOL;  Surgeon: Lollie Sails, MD;  Location: [facility];  Service: Endoscopy;  Laterality: N/A;   COLONOSCOPY WITH PROPOFOL N/A 09/09/2020   Procedure: COLONOSCOPY WITH PROPOFOL;  Surgeon: Lesly Rubenstein, MD;  Location: [facility];  Service: Endoscopy;  Laterality: N/A;   CYSTOSCOPY W/ RETROGRADES Bilateral 02/23/2020   Procedure: CYSTOSCOPY WITH RETROGRADE PYELOGRAM;  Surgeon: Abbie Sons, MD;  Location: [facility];  Service: Urology;  Laterality: Bilateral;   CYSTOSCOPY WITH BIOPSY N/A 02/23/2020   Procedure: CYSTOSCOPY WITH BIOPSY;  Surgeon: Abbie Sons, MD;  Location: [facility];  Service: Urology;  Laterality: N/A;   EYE SURGERY     HERNIA REPAIR     x 2   INGUINAL HERNIA REPAIR Right 12/02/2015   Procedure: HERNIA REPAIR INGUINAL ADULT;  Surgeon: Leonie Green, MD;  Location: [facility];  Service: General;  Laterality: Right;   PROSTATE SURGERY  2012   Right Carotid Endarterectomy     TOTAL HIP ARTHROPLASTY Right 08/01/2021   Procedure: TOTAL HIP ARTHROPLASTY;  Surgeon: Corky Mull, MD;  Location: [facility];  Service: Orthopedics;  Laterality: Right;   Social History:  reports that he quit smoking about 22 years ago. His smoking use included cigarettes. He has quit using smokeless tobacco. He reports that he does not currently use alcohol. He reports that he does not use drugs.  No Known Allergies  Family History  Problem Relation Age of Onset   Cancer Brother        2000   Prostate cancer Neg Hx    Bladder Cancer Neg Hx    Kidney cancer Neg Hx     Prior to Admission medications   Medication Sig Start Date End Date Taking? Authorizing Provider  acetaminophen (TYLENOL) 500 MG tablet Take 1-2 tablets (500-1,000 mg total) by mouth every 6 (six) hours as needed for mild pain. 08/04/21   Lattie Corns, PA-C  albuterol (PROVENTIL  HFA;VENTOLIN HFA) 108 (90 Base) MCG/ACT inhaler Inhale 2 puffs into the lungs every 6 (six) hours as needed for wheezing or shortness of breath. 10/19/16   Merlyn Lot, MD  amLODipine (NORVASC) 10 MG tablet Take 10 mg by mouth daily.  09/04/16   [provider]  apixaban (ELIQUIS) 2.5 MG TABS tablet Take 1 tablet (2.5 mg total) by mouth 2 (two) times daily. 08/04/21   Lattie Corns, PA-C  atorvastatin (LIPITOR) 40 MG tablet Take 40 mg by mouth daily.    [provider]  finasteride (PROSCAR) 5 MG tablet Take 1 tablet (5 mg total) by mouth daily. Patient not taking: Reported on 07/14/2021 11/13/16   Zara Council A, PA-C  Fluticasone-Umeclidin-Vilant (TRELEGY ELLIPTA) 100-62.5-25 MCG/INH AEPB Inhale 2 puffs into the lungs daily. 09/01/19   [provider]  gabapentin (NEURONTIN) 100 MG capsule Take 100 mg by mouth at bedtime as needed (pain).  [provider]  iron polysaccharides (NIFEREX) 150 MG capsule Take 150 mg by mouth daily. 03/03/20   [provider]  LORazepam (ATIVAN) 0.5 MG tablet Take 0.5 mg by mouth every 8 (eight) hours as needed for anxiety.    [provider]  losartan (COZAAR) 100 MG tablet Take 100 mg by mouth daily.  09/04/16   [provider]  ondansetron (ZOFRAN) 4 MG tablet Take 1 tablet (4 mg total) by mouth every 6 (six) hours as needed for nausea. 08/04/21   Lattie Corns, PA-C  oxyCODONE (OXY IR/ROXICODONE) 5 MG immediate release tablet Take 0.5 tablets (2.5 mg total) by mouth every 4 (four) hours as needed for moderate pain (pain score 4-6). 08/04/21   Lattie Corns, PA-C  tamsulosin (FLOMAX) 0.4 MG CAPS capsule Take 1 capsule (0.4 mg total) by mouth daily. 08/04/21   Lattie Corns, PA-C  tiZANidine (ZANAFLEX) 4 MG tablet Take 1 tablet (4 mg total) by mouth every 8 (eight) hours as needed for muscle spasms. 08/04/21   Lattie Corns, PA-C  vitamin B-12 (CYANOCOBALAMIN) 1000 MCG tablet  Take 1,000 mcg by mouth daily. Patient not taking: Reported on 07/21/2021    [provider]    Physical Exam: Vitals:   08/08/21 2320 08/08/21 2325 08/08/21 2330 08/08/21 2335  BP:   (!) 144/61   Pulse: 95 92  94  Resp: 20 19 (!) 25 (!) 23  Temp:      TempSrc:      SpO2: (!) 88% (!) 86%  92%  Weight:      Height:       Physical Exam Vitals and nursing note reviewed.  Constitutional:      General: He is not in acute distress.    Appearance: Normal appearance. He is ill-appearing.  HENT:     Head: Normocephalic and atraumatic.  Cardiovascular:     Rate and Rhythm: Regular rhythm. Tachycardia present.     Pulses: Normal pulses.     Heart sounds: Normal heart sounds. No murmur heard. Pulmonary:     Effort: Tachypnea and respiratory distress present.     Breath sounds: Rales present. No wheezing or rhonchi.  Abdominal:     General: Bowel sounds are normal.     Palpations: Abdomen is soft.     Tenderness: There is no abdominal tenderness.  Musculoskeletal:        General: No swelling or tenderness. Normal range of motion.     Cervical back: Normal range of motion and neck supple.  Skin:    General: Skin is warm and dry.  Neurological:     General: No focal deficit present.     Mental Status: He is alert. Mental status is at baseline.  Psychiatric:        Mood and Affect: Mood normal.        Behavior: Behavior normal.     Data Reviewed: Relevant notes from primary care and specialist visits, past discharge summaries as available in EHR, including Care Everywhere. Prior diagnostic testing as pertinent to current admission diagnoses Updated medications and problem lists for reconciliation ED course, including vitals, labs, imaging, treatment and response to treatment Triage notes, nursing and pharmacy notes and ED provider's notes Notable results as noted in HPI   Assessment and Plan: * Severe sepsis ([place]) Sepsis criteria includes leukocytosis, lactic  acidosis, tachypnea, hypoxia, AKI with respiratory failure, hypotension Secondary to pneumonia and likely UTI Continue sepsis fluid bolus Treat pneumonia and UTI  as outlined under their respective headings  Acute respiratory failure with hypoxia ([place]) Tachypnea with O2 sat 86% on room air requiring 3 L to maintain sats in the mid 90s Supplemental oxygen to maintain sats over 94 Soon after admission patient developed worsening of his respiratory failure requiring BiPAP Concern for fluid overload and possible underlying CHF Follow echocardiogram/BNP  Bilateral pneumonia Some concern for aspiration pneumonia given fluid-filled esophagus seen on CT We will treat with Zosyn Incentive spirometer Antitussives Supplemental oxygen DuoNebs as needed Maintain aspiration precautions We will keep n.p.o. overnight and get speech eval for swallow study in the a.m.  Elevated troponin Troponin of 48, suspect related to demand Continue to trend Echocardiogram to evaluate for wall motion abnormality Patient is denying chest pain and EKG is nonacute  Bilateral pleural effusion Trace bilateral pleural effusions No history of CHF Follow BNP Monitor for fluid overload in view of sepsis fluids We will get echocardiogram if not recently done  UTI (urinary tract infection) Antibiotics as outlined under pneumonia Follow cultures  On continuous oral anticoagulation Since 2/14 for postoperative DVT prophylaxis We will give subcu Lovenox prophylaxis instead  Anemia Hemoglobin trending down from recent hospitalization Continue to monitor CBC Latest Ref Rng & Units 08/08/2021 08/04/2021 08/03/2021  WBC 4.0 - 10.5 K/uL 13.8(H) 10.9(H) 8.7  Hemoglobin 13.0 - 17.0 g/dL 7.4(L) 7.8(L) 8.1(L)  Hematocrit 39.0 - 52.0 % 24.7(L) 25.3(L) 25.8(L)  Platelets 150 - 400 K/uL 325 207 182     Hyponatremia Possibly related to poor oral intake from acute illness Continue to monitor  AKI (acute kidney injury)  ([place]) Secondary to sepsis Expecting improvement with IV hydration  Status post right hip replacement 08/01/21 No acute issues suspected Can consider orthopedic consult in the a.m.  Coronary artery disease involving native coronary artery of native heart without angina pectoris- (present on admission) N.p.o. overnight To resume atorvastatin in the a.m. if swallowing okay See additional management on the elevated troponin above  Prostate cancer ([place])- (present on admission) No acute issues  Essential (primary) hypertension- (present on admission) Hold home antihypertensives due to hypotension  Chronic obstructive pulmonary disease ([place])- (present on admission) Not acutely exacerbated DuoNebs as needed  Abdominal aortic aneurysm (AAA) without rupture- (present on admission) No acute issues suspected at this time       Advance Care Planning:   Code Status: Prior   Consults: none  Family Communication: wife at bedside  Severity of Illness: The appropriate patient status for this patient is INPATIENT. Inpatient status is judged to be reasonable and necessary in order to provide the required intensity of service to ensure the patient's safety. The patient's presenting symptoms, physical exam findings, and initial radiographic and laboratory data in the context of their chronic comorbidities is felt to place them at high risk for further clinical deterioration. Furthermore, it is not anticipated that the patient will be medically stable for discharge from the hospital within 2 midnights of admission.   * I certify that at the point of admission it is my clinical judgment that the patient will require inpatient hospital care spanning beyond 2 midnights from the point of admission due to high intensity of service, high risk for further deterioration and high frequency of surveillance required.*  Author: Athena Masse, MD 08/08/2021 11:40 PM  For on call review [email or web address].

## 2021-08-08 NOTE — Assessment & Plan Note (Addendum)
Troponin of 48, suspect related to demand Downward trend. Echocardiogram to evaluate for wall motion abnormality Patient is denying chest pain and EKG is nonacute

## 2021-08-08 NOTE — Assessment & Plan Note (Addendum)
Secondary to sepsis Creatinine started improving, at 1.19 today. Baseline around 1

## 2021-08-08 NOTE — ED Provider Notes (Addendum)
[facility] Provider Note    None    (approximate)   History   Hypotension and hypoxia   HPI  Ruben Green is a 88 y.o. male had hip replacement on about February 14.  He comes in today complaining of lightheadedness.  He has a slight sore throat and a cold.  He has no other complaints.  No chest pain or tightness no coughing no shortness of breath no belly pain nausea vomiting no leg pain or swelling.  Blood pressure was low per EMS.      Physical Exam   Triage Vital Signs: ED Triage Vitals  Enc Vitals Group     BP --      Pulse --      Resp --      Temp --      Temp src --      SpO2 --      Weight 08/08/21 2104 171 lb 15.3 oz (78 kg)     Height 08/08/21 2104 5\' 2"  (1.575 m)     Head Circumference --      Peak Flow --      Pain Score 08/08/21 2103 0     Pain Loc --      Pain Edu? --      Excl. in [place]? --     Most recent vital signs: Vitals:   08/08/21 2330 08/08/21 2335  BP: (!) 144/61   Pulse:  94  Resp: (!) 25 (!) 23  Temp:    SpO2:  92%     General: Awake, no distress.  CV:  Good peripheral perfusion.  Heart regular rate and rhythm no audible murmurs Resp:  Normal effort.  Lungs are clear Abd:  No distention.  Abdomen soft bowel sounds positive nontender Extremities: No edema   ED Results / Procedures / Treatments   Labs (all labs ordered are listed, but only abnormal results are displayed) Labs Reviewed  COMPREHENSIVE METABOLIC PANEL - Abnormal; Notable for the following components:      Result Value   Sodium 129 (*)    Glucose, Bld 204 (*)    BUN 36 (*)    Creatinine, Ser 1.35 (*)    Calcium 7.8 (*)    Total Protein 6.3 (*)    Albumin 2.5 (*)    GFR, Estimated 51 (*)    All other components within normal limits  LACTIC ACID, PLASMA - Abnormal; Notable for the following components:   Lactic Acid, Venous 2.5 (*)    All other components within normal limits  LACTIC ACID, PLASMA - Abnormal; Notable for the  following components:   Lactic Acid, Venous 2.3 (*)    All other components within normal limits  CBC WITH DIFFERENTIAL/PLATELET - Abnormal; Notable for the following components:   WBC 13.8 (*)    RBC 2.74 (*)    Hemoglobin 7.4 (*)    HCT 24.7 (*)    RDW 16.3 (*)    Neutro Abs 12.1 (*)    Lymphs Abs 0.4 (*)    Abs Immature Granulocytes 0.31 (*)    All other components within normal limits  URINALYSIS, ROUTINE W REFLEX MICROSCOPIC - Abnormal; Notable for the following components:   Color, Urine YELLOW (*)    APPearance CLOUDY (*)    Hgb urine dipstick MODERATE (*)    Leukocytes,Ua LARGE (*)    Bacteria, UA FEW (*)    All other components within normal limits  D-DIMER, QUANTITATIVE -  Abnormal; Notable for the following components:   D-Dimer, Quant 2.71 (*)    All other components within normal limits  TROPONIN I (HIGH SENSITIVITY) - Abnormal; Notable for the following components:   Troponin I (High Sensitivity) 48 (*)    All other components within normal limits  RESP PANEL BY RT-PCR (FLU A&B, COVID) [id number]  GROUP A STREP BY PCR  CULTURE, BLOOD (SINGLE)  URINE CULTURE  BRAIN NATRIURETIC PEPTIDE  COMPREHENSIVE METABOLIC PANEL  CBC  TROPONIN I (HIGH SENSITIVITY)     EKG  EKG read interpreted by me shows normal sinus rhythm rate of 90 normal axis some artifact and PVCs are present apparent right bundle branch block no acute ST-T changes are seen   RADIOLOGY CT read by radiology reviewed by me shows possible pneumonia along with atelectasis and some new effusions.   PROCEDURES:  Critical Care performed: Critical care time 15 minutes.  This includes speaking with the patient and his relatives and reviewing his studies checking his old labs and some of his old x-ray studies.  Additionally I have reviewed all of his current labs.  I am speaking with the hospitalist.  Procedures   MEDICATIONS ORDERED IN ED: Medications  lactated ringers infusion (0 mLs Intravenous Hold  08/08/21 2309)  lactated ringers bolus 1,000 mL (1,000 mLs Intravenous New Bag/Given 08/08/21 2328)    And  lactated ringers bolus 1,000 mL (1,000 mLs Intravenous New Bag/Given 08/08/21 2333)    And  lactated ringers bolus 500 mL (0 mLs Intravenous Hold 08/08/21 2350)  azithromycin (ZITHROMAX) 500 mg in sodium chloride 0.9 % 250 mL IVPB (500 mg Intravenous New Bag/Given 08/08/21 2337)  enoxaparin (LOVENOX) injection 40 mg (has no administration in time range)  acetaminophen (TYLENOL) tablet 650 mg (has no administration in time range)    Or  acetaminophen (TYLENOL) suppository 650 mg (has no administration in time range)  ondansetron (ZOFRAN) tablet 4 mg (has no administration in time range)    Or  ondansetron (ZOFRAN) injection 4 mg (has no administration in time range)  piperacillin-tazobactam (ZOSYN) IVPB 3.375 g (has no administration in time range)  acetaminophen (TYLENOL) tablet 1,000 mg (1,000 mg Oral Given 08/08/21 2159)  iohexol (OMNIPAQUE) 350 MG/ML injection 100 mL (100 mLs Intravenous Contrast Given 08/08/21 2210)  ceFEPIme (MAXIPIME) 1 g in sodium chloride 0.9 % 100 mL IVPB (1 g Intravenous New Bag/Given 08/08/21 2337)     IMPRESSION / MDM / ASSESSMENT AND PLAN / ED COURSE  I reviewed the triage vital signs and the nursing notes. Patient will need admission.  He is hypotensive and was hypoxic at 1 point.  He appears to have a UTI and pneumonia.  These both could be sources of his sepsis.  He does not have a PE.  This is fortunate.  I will treat him with antibiotics for both pneumonia and UTI. ----------------------------------------- 12:08 AM on 08/09/2021 ----------------------------------------- Patient's pulse ox is going up to 97 and down to about 85 or 86 with a good waveform while the patient is awake.  I am unsure what is going on.  He has had a lot of fluid for his sepsis treatment.  I do want to make sure he is not getting CHF.  I Minna repeat his chest x-ray.    The  patient is on the cardiac monitor to evaluate for evidence of arrhythmia and/or significant heart rate changes.  None have been seen      FINAL CLINICAL IMPRESSION(S) / ED DIAGNOSES  Final diagnoses:  Hypotension, unspecified hypotension type  Urinary tract infection without hematuria, site unspecified  Community acquired pneumonia, unspecified laterality  Pleural effusion  Sepsis, due to unspecified organism, unspecified whether acute organ dysfunction present ([place])  Hypoxia     Rx / DC Orders   ED Discharge Orders     None        Note:  This document was prepared using Dragon voice recognition software and may include unintentional dictation errors.   Nena Polio, MD 08/09/21 0009 ----------------------------------------- 12:24 AM on 08/09/2021 ----------------------------------------- Patient's O2 sats are now dropping down to 82 and then going up somewhat.  Chest x-ray looks somewhat fluffy or like there is more fluid there than there had been.  I am going to stop the fluid and start some BiPAP.  I have just notified Dr. Damita Dunnings about this.   Nena Polio, MD 08/09/21 [phone]

## 2021-08-08 NOTE — Sepsis Progress Note (Signed)
Following per sepsis protocol   

## 2021-08-08 NOTE — Assessment & Plan Note (Addendum)
Resolved.  Possibly related to poor oral intake from acute illness -Continue to monitor

## 2021-08-08 NOTE — Assessment & Plan Note (Addendum)
Sepsis criteria includes leukocytosis, lactic acidosis, tachypnea, hypoxia, AKI with respiratory failure, hypotension Secondary to pneumonia and likely UTI Received fluid per sepsis protocol. Initially received cefepime and Zithromax and later transitioned to Zosyn for concern of aspiration pneumonia. Initial blood cultures negative so far.  Urine cultures pending. -Check procalcitonin -Continue Zosyn

## 2021-08-08 NOTE — Assessment & Plan Note (Signed)
Antibiotics as outlined under pneumonia Follow cultures

## 2021-08-08 NOTE — Assessment & Plan Note (Signed)
Trace bilateral pleural effusions No history of CHF Follow BNP Monitor for fluid overload in view of sepsis fluids We will get echocardiogram if not recently done

## 2021-08-08 NOTE — Progress Notes (Signed)
Pharmacy Antibiotic Note  Ruben Green is a 88 y.o. male admitted on 08/08/2021 with aspiration pneumonia.  Pharmacy has been consulted for Zosyn dosing.  Plan: Zosyn 3.375g IV q8h (4 hour infusion). X 5 days per indication and renal function.  Pharmacy will continue to follow and adjust and/or extend abx dosing if warranted.  Height: 5\' 2"  (157.5 cm) Weight: 78 kg (171 lb 15.3 oz) IBW/kg (Calculated) : 54.6  Temp (24hrs), Avg:98.8 F (37.1 C), Min:98.8 F (37.1 C), Max:98.8 F (37.1 C)  Recent Labs  Lab 08/02/21 0521 08/03/21 0619 08/04/21 0600 08/08/21 2102  WBC 9.1 8.7 10.9* 13.8*  CREATININE 1.19 1.22 1.27* 1.35*  LATICACIDVEN  --   --   --  2.5*    Estimated Creatinine Clearance: 36.2 mL/min (A) (by C-G formula based on SCr of 1.35 mg/dL (H)).    No Known Allergies  Antimicrobials this admission: 2/21 Azithromycin  >> x 1 dose 2/21 Cefepime >> x 1 dose 2/22 Zosyn >> x 5 days  Microbiology results: 2/21 BCx: Pending 2/21 UCx: Pending   Thank you for allowing pharmacy to be a part of this patients care.  Renda Rolls, PharmD, [facility] 08/08/2021 11:58 PM

## 2021-08-09 ENCOUNTER — Inpatient Hospital Stay: Payer: PPO

## 2021-08-09 LAB — COMPREHENSIVE METABOLIC PANEL
ALT: 13 U/L (ref 0–44)
AST: 16 U/L (ref 15–41)
Albumin: 2.5 g/dL — ABNORMAL LOW (ref 3.5–5.0)
Alkaline Phosphatase: 69 U/L (ref 38–126)
Anion gap: 10 (ref 5–15)
BUN: 29 mg/dL — ABNORMAL HIGH (ref 8–23)
CO2: 24 mmol/L (ref 22–32)
Calcium: 8.1 mg/dL — ABNORMAL LOW (ref 8.9–10.3)
Chloride: 101 mmol/L (ref 98–111)
Creatinine, Ser: 1.19 mg/dL (ref 0.61–1.24)
GFR, Estimated: 60 mL/min — ABNORMAL LOW (ref >60)
Glucose, Bld: 123 mg/dL — ABNORMAL HIGH (ref 70–99)
Potassium: 4.2 mmol/L (ref 3.5–5.1)
Sodium: 135 mmol/L (ref 135–145)
Total Bilirubin: 0.7 mg/dL (ref 0.3–1.2)
Total Protein: 6 g/dL — ABNORMAL LOW (ref 6.5–8.1)

## 2021-08-09 LAB — CBC
HCT: 24.7 % — ABNORMAL LOW (ref 39.0–52.0)
Hemoglobin: 7.5 g/dL — ABNORMAL LOW (ref 13.0–17.0)
MCH: 27.4 pg (ref 26.0–34.0)
MCHC: 30.4 g/dL (ref 30.0–36.0)
MCV: 90.1 fL (ref 80.0–100.0)
Platelets: 290 10*3/uL (ref 150–400)
RBC: 2.74 MIL/uL — ABNORMAL LOW (ref 4.22–5.81)
RDW: 16.4 % — ABNORMAL HIGH (ref 11.5–15.5)
WBC: 12.6 10*3/uL — ABNORMAL HIGH (ref 4.0–10.5)
nRBC: 0 % (ref 0.0–0.2)

## 2021-08-09 LAB — LACTIC ACID, PLASMA
Lactic Acid, Venous: 2.3 mmol/L (ref 0.5–1.9)
Lactic Acid, Venous: 0.9 mmol/L (ref 0.5–1.9)

## 2021-08-09 LAB — PROCALCITONIN: Procalcitonin: 0.23 ng/mL

## 2021-08-09 LAB — BRAIN NATRIURETIC PEPTIDE: B Natriuretic Peptide: 259.6 pg/mL — ABNORMAL HIGH (ref 0.0–100.0)

## 2021-08-09 LAB — GROUP A STREP BY PCR: Group A Strep by PCR: NOT DETECTED

## 2021-08-09 LAB — TROPONIN I (HIGH SENSITIVITY): Troponin I (High Sensitivity): 40 ng/L — ABNORMAL HIGH (ref <18)

## 2021-08-09 MED ORDER — OXYCODONE HCL 5 MG PO TABS: 2.5000 mg | ORAL_TABLET | ORAL | Status: DC | PRN

## 2021-08-09 MED ORDER — APIXABAN 2.5 MG PO TABS
2.5000 mg | ORAL_TABLET | Freq: Two times a day (BID) | ORAL | Status: DC
  Administered 2021-08-10 – 2021-08-12 (×5): 2.5 mg via ORAL
  Filled 2021-08-09 (×5): qty 1

## 2021-08-09 MED ORDER — ATORVASTATIN CALCIUM 20 MG PO TABS
40.0000 mg | ORAL_TABLET | Freq: Every day | ORAL | Status: DC
  Administered 2021-08-09 – 2021-08-12 (×4): 40 mg via ORAL
  Filled 2021-08-09 (×4): qty 2

## 2021-08-09 MED ORDER — TAMSULOSIN HCL 0.4 MG PO CAPS
0.4000 mg | ORAL_CAPSULE | Freq: Every day | ORAL | Status: DC
  Administered 2021-08-09 – 2021-08-12 (×4): 0.4 mg via ORAL
  Filled 2021-08-09 (×4): qty 1

## 2021-08-09 MED ORDER — POLYSACCHARIDE IRON COMPLEX 150 MG PO CAPS
150.0000 mg | ORAL_CAPSULE | Freq: Every day | ORAL | Status: DC
  Administered 2021-08-09 – 2021-08-12 (×4): 150 mg via ORAL
  Filled 2021-08-09 (×4): qty 1

## 2021-08-09 MED ORDER — KETOROLAC TROMETHAMINE 30 MG/ML IJ SOLN
15.0000 mg | Freq: Once | INTRAMUSCULAR | Status: AC
  Administered 2021-08-09: 15 mg via INTRAVENOUS
  Filled 2021-08-09: qty 1

## 2021-08-09 MED ORDER — CHLORHEXIDINE GLUCONATE CLOTH 2 % EX PADS
6.0000 | MEDICATED_PAD | Freq: Every day | CUTANEOUS | Status: DC
  Administered 2021-08-10 – 2021-08-12 (×3): 6 via TOPICAL

## 2021-08-09 MED ORDER — VITAMIN B-12 1000 MCG PO TABS
1000.0000 ug | ORAL_TABLET | Freq: Every day | ORAL | Status: DC
  Administered 2021-08-09 – 2021-08-12 (×4): 1000 ug via ORAL
  Filled 2021-08-09 (×4): qty 1

## 2021-08-09 MED ORDER — TIZANIDINE HCL 4 MG PO TABS
4.0000 mg | ORAL_TABLET | Freq: Three times a day (TID) | ORAL | Status: DC | PRN
  Filled 2021-08-09: qty 1

## 2021-08-09 MED ORDER — ACETAMINOPHEN 500 MG PO TABS: 500.0000 mg | ORAL_TABLET | Freq: Four times a day (QID) | ORAL | Status: DC | PRN

## 2021-08-09 MED ORDER — GABAPENTIN 100 MG PO CAPS: 100.0000 mg | ORAL_CAPSULE | Freq: Every evening | ORAL | Status: DC | PRN

## 2021-08-09 MED ORDER — UMECLIDINIUM BROMIDE 62.5 MCG/ACT IN AEPB
1.0000 | INHALATION_SPRAY | Freq: Every day | RESPIRATORY_TRACT | Status: DC
Start: 2021-08-09 — End: 2021-08-12
  Administered 2021-08-09 – 2021-08-12 (×4): 1 via RESPIRATORY_TRACT
  Filled 2021-08-09: qty 7

## 2021-08-09 MED ORDER — FLUTICASONE FUROATE-VILANTEROL 100-25 MCG/ACT IN AEPB
1.0000 | INHALATION_SPRAY | Freq: Every day | RESPIRATORY_TRACT | Status: DC
  Administered 2021-08-09 – 2021-08-12 (×4): 1 via RESPIRATORY_TRACT
  Filled 2021-08-09: qty 28

## 2021-08-09 NOTE — Progress Notes (Signed)
Progress Note   Patient: Ruben Green KGY:[id number] DOB: Feb 22, 1934 DOA: 08/08/2021     1 DOS: the patient was seen and examined on 08/09/2021   Brief hospital course: Taken from H&P.  Hubbard MAKOTO SELLITTO is a 88 y.o. male with medical history significant of CAD, COPD, HTN, prostate cancer, AAA, who is s/p right hip replacement on 0/26, complicated by postoperative hypoxia, ruled out for PE with CTA chest, also with finding of postoperative anemia of 7.8, which remained stable for which she did not receive transfusions, and discharged in stable condition, and who is currently on Eliquis for DVT prophylaxis postop, who presents to the ED with a complaint of lightheadedness, sore throat and cough and cold symptoms.  He denies fever or chills, chest pain, vomiting or diarrhea or abdominal pain.  EMS reports a blood pressure of 60/40 and O2 sat 88% on room air requiring 3 L with improvement to 97%.   On arrival to ED his blood pressure was 102/57, responded to IV fluid.  He was found to have leukocytosis, lactic acidosis at 2.5 which has been now normalized.  Mildly positive troponin with downward trend.  Hemoglobin of 7.4, D-dimer of 2.71, mild AKI with creatinine of 1.35, sodium of 129, I UA with large leukocyte esterase and few bacteria, COVID and flu negative.  CTA was negative for PE but did show trace bilateral pleural effusions right greater than left, associated passive atelectasis of bilateral lower lobes with some concern of superimposed infection/inflammation. There was an hiatal hernia with associated air-fluid level within the mid esophagus and emphysema, there was concern of aspiration pneumonia so he was started on cefepime and Zithromax in ED which was later transitioned to Zosyn.  In ED he transiently required BiPAP due to worsening shortness of breath.  Currently saturating well on 5 to 6 L of oxygen.   Assessment and Plan: * Severe sepsis ([place]) Sepsis criteria includes  leukocytosis, lactic acidosis, tachypnea, hypoxia, AKI with respiratory failure, hypotension Secondary to pneumonia and likely UTI Received fluid per sepsis protocol. Initially received cefepime and Zithromax and later transitioned to Zosyn for concern of aspiration pneumonia. Initial blood cultures negative so far.  Urine cultures pending. -Check procalcitonin -Continue Zosyn  Acute respiratory failure with hypoxia ([facility]) Tachypnea with O2 sat 86% on room air. Initially requiring 3 L of oxygen, he was placed on BiPAP transiently in ED due to worsening respiratory status.  There was some concern of CHF due to elevated BNP at 259 which is not very conclusive. Echocardiogram was ordered-pending. CTA with concern of emphysema although no baseline oxygen requirement.  Apparently developed hypoxia after hip replacement surgery and discharged home on 3 L of oxygen. -Stop IV fluid -Continue supplemental oxygen-wean as tolerated.  Bilateral pneumonia Some concern for aspiration pneumonia given fluid-filled esophagus seen on CT, he was started on Zosyn. -Check procalcitonin -Continue with Zosyn -Incentive spirometer -Antitussives -Supplemental oxygen -DuoNebs as needed -Maintain aspiration precautions -Start him on diet once swallow evaluation completed.  UTI (urinary tract infection) Antibiotics as outlined under pneumonia Follow cultures  Hyponatremia Resolved.  Possibly related to poor oral intake from acute illness -Continue to monitor  Status post right hip replacement 08/01/21 No acute issues suspected Surgical site appears well-healing.  Essential (primary) hypertension- (present on admission) Blood pressure within goal now. -Keep holding home amlodipine and losartan-we will restart as needed  Chronic obstructive pulmonary disease ([place])- (present on admission) Not acutely exacerbated DuoNebs as needed  AKI (acute kidney injury) ([place]) Secondary  to sepsis Creatinine started  improving, at 1.19 today. Baseline around 1  Coronary artery disease involving native coronary artery of native heart without angina pectoris- (present on admission) No chest pain.  Mildly elevated troponin with a downward trend, most likely secondary to demand. -Continue home dose of statin  Elevated troponin Troponin of 48, suspect related to demand Downward trend. Echocardiogram to evaluate for wall motion abnormality Patient is denying chest pain and EKG is nonacute  On continuous oral anticoagulation Since 2/14 for postoperative DVT prophylaxis We will give subcu Lovenox prophylaxis instead  Bilateral pleural effusion Trace bilateral pleural effusions No history of CHF Follow BNP Monitor for fluid overload in view of sepsis fluids We will get echocardiogram if not recently done  Anemia Hemoglobin trending down from recent hospitalization Continue to monitor CBC Latest Ref Rng & Units 08/08/2021 08/04/2021 08/03/2021  WBC 4.0 - 10.5 K/uL 13.8(H) 10.9(H) 8.7  Hemoglobin 13.0 - 17.0 g/dL 7.4(L) 7.8(L) 8.1(L)  Hematocrit 39.0 - 52.0 % 24.7(L) 25.3(L) 25.8(L)  Platelets 150 - 400 K/uL 325 207 182   -Continue home iron and B12 supplement  Prostate cancer ([place])- (present on admission) No acute issues  Abdominal aortic aneurysm (AAA) without rupture- (present on admission) No acute issues suspected at this time  Subjective: Patient continued to have some cough and shortness of breath.  Also complaining of some right hip pain.  He was quite uncomfortable in ED stretcher.  Quite hard of hearing.  Son at bedside.  Physical Exam: Vitals:   08/09/21 1230 08/09/21 1245 08/09/21 1300 08/09/21 1418  BP: 140/67  124/72 (!) 115/59  Pulse: 87 91 92 97  Resp: 20 (!) 22 (!) 21 20  Temp:    97.8 F (36.6 C)  TempSrc:      SpO2: 97% 95% 97% 90%  Weight:      Height:       General.  Frail elderly man, in no acute distress. Pulmonary.  Few scattered rhonchi bilaterally, normal  respiratory effort. CV.  Regular rate and rhythm, no JVD, rub or murmur. Abdomen.  Soft, nontender, nondistended, BS positive. CNS.  Alert and oriented .  No focal neurologic deficit. Extremities.  No edema, no cyanosis, pulses intact and symmetrical. Psychiatry.  Judgment and insight appears normal.  Data Reviewed: I reviewed prior notes, imaging and labs.  Family Communication: Discussed with son at bedside  Disposition: Status is: Inpatient Remains inpatient appropriate because: Severity of illness   Planned Discharge Destination: Home with Home Health  DVT prophylaxis.  Eliquis  Time spent: 50 minutes  This record has been created using Systems analyst. Errors have been sought and corrected,but may not always be located. Such creation errors do not reflect on the standard of care.  Author: Lorella Nimrod, MD 08/09/2021 2:22 PM  For on call review [email or web address].

## 2021-08-09 NOTE — TOC Progression Note (Signed)
Transition of Care [facility]) - Progression Note    Patient Details  Name: Ruben Green MRN: [id number] Date of Birth: 09-26-1933  Transition of Care [facility]) CM/SW Contact  Shelbie Hutching, RN Phone Number: 08/09/2021, 1:46 PM  Clinical Narrative:    Received a call from [place] at [facility].  Patient is open with them for home health PT.  They would like to add [facility] nursing to his orders at discharge.        Expected Discharge Plan and Services                                                 Social Determinants of Health (SDOH) Interventions    Readmission Risk Interventions No flowsheet data found.

## 2021-08-09 NOTE — Hospital Course (Addendum)
Taken from H&P.  Shaan DATHAN ATTIA is a 88 y.o. male with medical history significant of CAD, COPD, HTN, prostate cancer, AAA, who is s/p right hip replacement on 1/79, complicated by postoperative hypoxia, ruled out for PE with CTA chest, also with finding of postoperative anemia of 7.8, which remained stable for which she did not receive transfusions, and discharged in stable condition, and who is currently on Eliquis for DVT prophylaxis postop, who presents to the ED with a complaint of lightheadedness, sore throat and cough and cold symptoms.  He denies fever or chills, chest pain, vomiting or diarrhea or abdominal pain.  EMS reports a blood pressure of 60/40 and O2 sat 88% on room air requiring 3 L with improvement to 97%.   On arrival to ED his blood pressure was 102/57, responded to IV fluid.  He was found to have leukocytosis, lactic acidosis at 2.5 which has been now normalized.  Mildly positive troponin with downward trend.  Hemoglobin of 7.4, D-dimer of 2.71, mild AKI with creatinine of 1.35, sodium of 129, I UA with large leukocyte esterase and few bacteria, COVID and flu negative.  CTA was negative for PE but did show trace bilateral pleural effusions right greater than left, associated passive atelectasis of bilateral lower lobes with some concern of superimposed infection/inflammation. There was an hiatal hernia with associated air-fluid level within the mid esophagus and emphysema, there was concern of aspiration pneumonia so he was started on cefepime and Zithromax in ED which was later transitioned to Zosyn.  In ED he transiently required BiPAP due to worsening shortness of breath.  Currently saturating well on 5 to 6 L of oxygen.  2/23: Oxygen requirement improved to 2 L.  Urine cultures growing more than 100,000 colonies of gram-negative rods.  2/25; urine culture grew Serratia marcescens , he was discharged home yesterday on appropriate antibiotics.  Appealed the discharge as he was  still feeling weak. Clinically remained stable and no new complaints today.

## 2021-08-09 NOTE — ED Notes (Signed)
Elana RN aware of assigned bed

## 2021-08-09 NOTE — Evaluation (Addendum)
Physical Therapy Evaluation Patient Details Name: Ruben Green MRN: [id number] DOB: [birth year] Today's Date: 08/09/2021  History of Present Illness  Pt. is an 88 y.o. male s/p Posterior R THA on 2/14. Recently admitted due to c/o lightheadedness (2/21), Severe Sepsis, acute respiratory failure w/ hypoxia, bilateral pneumonia. EMS reported a BP of 60/40 and ED reported BP of 102/57. PmHx: CAD, COPD, HTN, Prostate Cancer, AAA   Clinical Impression  Pt awake and alert resting in bed upon w/ wife at bedside upon PT entrance into room for evaluation. Pt is A&Ox4, does not report numerical value for pain, but is seen grimacing and moaning throughout transfers during today's session. Pt and wife provide PLOF history, and report he is still working and is independent w/ ADLs at home prior to recent hospitalization, as well as recent R THA.   Pt was able to perform bed mobility w/minA; with reliance on bed rails and PT for R LE transfer due to weakness and pain from recent THA. Once seated EOB; he was able to perform sit to stand w/ SUPERVISION using RW to transfer to recliner to get SpO2 reading from dynamap due to poor reading from handheld pulseox. Pt was able to perform sit to stand from recliner with same level of assistance needed for transfer from bed. Once standing he was able to ambulate ~10ft w/ CGA using RW and reported no c/o negative symptoms throughout (pt on 6Ls). Pt returned to recliner w/ all needs within reach and O2 returned to 5L, spo@ >92%. Pt will benefit from continued skilled PT in order to improve LE strength, mobility, gait, decrease c/o pain, and restore PLOF. Current discharge recommendation to HHPT is appropriate due to the level of assistance required by the patient to ensure safety and improve overall function.        Recommendations for follow up therapy are one component of a multi-disciplinary discharge planning process, led by the attending physician.  Recommendations  may be updated based on patient status, additional functional criteria and insurance authorization.  Follow Up Recommendations Home health PT    Assistance Recommended at Discharge Frequent or constant Supervision/Assistance  Patient can return home with the following  A little help with walking and/or transfers;A little help with bathing/dressing/bathroom;Assist for transportation;Help with stairs or ramp for entrance;Assistance with cooking/housework    Equipment Recommendations None recommended by PT  Recommendations for Other Services       Functional Status Assessment Patient has had a recent decline in their functional status and demonstrates the ability to make significant improvements in function in a reasonable and predictable amount of time.     Precautions / Restrictions Precautions Precautions: Posterior Hip;Fall Precaution Booklet Issued: Yes (comment) Restrictions Weight Bearing Restrictions: Yes RLE Weight Bearing: Weight bearing as tolerated      Mobility  Bed Mobility Overal bed mobility: Needs Assistance Bed Mobility: Supine to Sit     Supine to sit: Min assist          Transfers Overall transfer level: Needs assistance Equipment used: Rolling walker (2 wheels) Transfers: Sit to/from Stand, Bed to chair/wheelchair/BSC Sit to Stand: Min guard   Step pivot transfers: Supervision            Ambulation/Gait Ambulation/Gait assistance: Supervision Gait Distance (Feet): 40 Feet Assistive device: Rolling walker (2 wheels) Gait Pattern/deviations: Step-through pattern, Decreased step length - right, Decreased step length - left, Decreased stride length Gait velocity: decreased     General Gait Details: Pt was eager/impulsive w/  walking  Stairs            Wheelchair Mobility    Modified Rankin (Stroke Patients Only)       Balance Overall balance assessment: Needs assistance Sitting-balance support: Feet supported Sitting  balance-Leahy Scale: Good     Standing balance support: Bilateral upper extremity supported, During functional activity, Reliant on assistive device for balance Standing balance-Leahy Scale: Good                               Pertinent Vitals/Pain Pain Assessment Pain Assessment: Faces Pain Descriptors / Indicators: Grimacing, Aching, Discomfort, Sore Pain Intervention(s): Limited activity within patient's tolerance, Monitored during session, Repositioned    Home Living Family/patient expects to be discharged to:: Private residence Living Arrangements: Spouse/significant other Available Help at Discharge: Family Type of Home: House Home Access: Stairs to enter Entrance Stairs-Rails: None Entrance Stairs-Number of Steps: 4     Home Equipment: Conservation officer, nature (2 wheels) Additional Comments: HOH and language barrier    Prior Function Prior Level of Function : Working/employed;Independent/Modified Independent             Mobility Comments: 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 ADLs Comments: Reports he is independent with ADLs     Hand Dominance        Extremity/Trunk Assessment   Upper Extremity Assessment Upper Extremity Assessment: Overall WFL for tasks assessed    Lower Extremity Assessment Lower Extremity Assessment: Generalized weakness;RLE deficits/detail;Overall WFL for tasks assessed RLE Deficits / Details: Weakness due to recent THA       Communication   Communication: HOH;Prefers language other than English  Cognition Arousal/Alertness: Awake/alert Behavior During Therapy: WFL for tasks assessed/performed Overall Cognitive Status: Within Functional Limits for tasks assessed                                          General Comments      Exercises     Assessment/Plan    PT Assessment Patient needs continued PT services  PT Problem List Decreased strength;Decreased balance;Decreased  mobility;Decreased safety awareness;Decreased knowledge of use of DME;Decreased knowledge of precautions;Pain;Decreased range of motion;Decreased activity tolerance       PT Treatment Interventions Gait training;Therapeutic exercise;DME instruction;Therapeutic activities;Balance training;Functional mobility training    PT Goals (Current goals can be found in the Care Plan section)  Acute Rehab PT Goals Patient Stated Goal: to go home PT Goal Formulation: With patient Time For Goal Achievement: 08/23/21 Potential to Achieve Goals: Good    Frequency 7X/week     Co-evaluation               AM-PAC PT "6 Clicks" Mobility  Outcome Measure Help needed turning from your back to your side while in a flat bed without using bedrails?: A Little Help needed moving from lying on your back to sitting on the side of a flat bed without using bedrails?: A Little Help needed moving to and from a bed to a chair (including a wheelchair)?: A Little Help needed standing up from a chair using your arms (e.g., wheelchair or bedside chair)?: A Little Help needed to walk in hospital room?: A Little Help needed climbing 3-5 steps with a railing? : A Little 6 Click Score: 18    End of Session Equipment Utilized During Treatment: Gait  belt Activity Tolerance: Patient tolerated treatment well Patient left: in bed;with call bell/phone within reach;with bed alarm set;with nursing/sitter in room;with family/visitor present Nurse Communication: Mobility status PT Visit Diagnosis: Muscle weakness (generalized) (M62.81);Difficulty in walking, not elsewhere classified (R26.2);Pain Pain - Right/Left: Right Pain - part of body: Hip    Time: 3235-5732 PT Time Calculation (min) (ACUTE ONLY): 29 min   Charges:            Jonnie Kind, SPT 08/09/2021, 4:06 PM

## 2021-08-09 NOTE — ED Notes (Addendum)
Pt moved to hospital be with EDT Faith assistance. Pt given warm blankets.Pt denies any needs at this time.  Pt and family updated on plan of care and room status.

## 2021-08-10 ENCOUNTER — Inpatient Hospital Stay
Admit: 2021-08-10 | Discharge: 2021-08-10 | Disposition: A | Discharge status: Home / Self Care | Payer: PPO | Attending: Internal Medicine | Admitting: Internal Medicine

## 2021-08-10 LAB — ECHOCARDIOGRAM COMPLETE
Area-P 1/2: 4.06 cm2
Height: 62 in
S' Lateral: 3.2 cm
Weight: 2751.34 oz

## 2021-08-10 LAB — CBC
HCT: 24 % — ABNORMAL LOW (ref 39.0–52.0)
Hemoglobin: 7.2 g/dL — ABNORMAL LOW (ref 13.0–17.0)
MCH: 27 pg (ref 26.0–34.0)
MCHC: 30 g/dL (ref 30.0–36.0)
MCV: 89.9 fL (ref 80.0–100.0)
Platelets: 305 10*3/uL (ref 150–400)
RBC: 2.67 MIL/uL — ABNORMAL LOW (ref 4.22–5.81)
RDW: 16.6 % — ABNORMAL HIGH (ref 11.5–15.5)
WBC: 9.5 10*3/uL (ref 4.0–10.5)
nRBC: 0 % (ref 0.0–0.2)

## 2021-08-10 LAB — BASIC METABOLIC PANEL
Anion gap: 6 (ref 5–15)
BUN: 28 mg/dL — ABNORMAL HIGH (ref 8–23)
CO2: 24 mmol/L (ref 22–32)
Calcium: 7.9 mg/dL — ABNORMAL LOW (ref 8.9–10.3)
Chloride: 107 mmol/L (ref 98–111)
Creatinine, Ser: 1.41 mg/dL — ABNORMAL HIGH (ref 0.61–1.24)
GFR, Estimated: 49 mL/min — ABNORMAL LOW (ref >60)
Glucose, Bld: 107 mg/dL — ABNORMAL HIGH (ref 70–99)
Potassium: 4.5 mmol/L (ref 3.5–5.1)
Sodium: 137 mmol/L (ref 135–145)

## 2021-08-10 LAB — PREPARE RBC (CROSSMATCH)

## 2021-08-10 LAB — HEMOGLOBIN AND HEMATOCRIT, BLOOD
HCT: 25.6 % — ABNORMAL LOW (ref 39.0–52.0)
Hemoglobin: 8 g/dL — ABNORMAL LOW (ref 13.0–17.0)

## 2021-08-10 MED ORDER — SODIUM CHLORIDE 0.9% IV SOLUTION: Freq: Once | INTRAVENOUS | Status: AC

## 2021-08-10 MED ORDER — LACTATED RINGERS IV SOLN: INTRAVENOUS | Status: AC

## 2021-08-10 MED ORDER — MELATONIN 5 MG PO TABS
5.0000 mg | ORAL_TABLET | Freq: Once | ORAL | Status: AC
  Administered 2021-08-10: 5 mg via ORAL
  Filled 2021-08-10: qty 1

## 2021-08-10 NOTE — Progress Notes (Signed)
Progress Note   Patient: Ruben Green TKZ:[id number] DOB: 03-23-1934 DOA: 08/08/2021     2 DOS: the patient was seen and examined on 08/10/2021   Brief hospital course: Taken from H&P.  Gerrad ROGUE RAFALSKI is a 88 y.o. male with medical history significant of CAD, COPD, HTN, prostate cancer, AAA, who is s/p right hip replacement on 5/73, complicated by postoperative hypoxia, ruled out for PE with CTA chest, also with finding of postoperative anemia of 7.8, which remained stable for which she did not receive transfusions, and discharged in stable condition, and who is currently on Eliquis for DVT prophylaxis postop, who presents to the ED with a complaint of lightheadedness, sore throat and cough and cold symptoms.  He denies fever or chills, chest pain, vomiting or diarrhea or abdominal pain.  EMS reports a blood pressure of 60/40 and O2 sat 88% on room air requiring 3 L with improvement to 97%.   On arrival to ED his blood pressure was 102/57, responded to IV fluid.  He was found to have leukocytosis, lactic acidosis at 2.5 which has been now normalized.  Mildly positive troponin with downward trend.  Hemoglobin of 7.4, D-dimer of 2.71, mild AKI with creatinine of 1.35, sodium of 129, I UA with large leukocyte esterase and few bacteria, COVID and flu negative.  CTA was negative for PE but did show trace bilateral pleural effusions right greater than left, associated passive atelectasis of bilateral lower lobes with some concern of superimposed infection/inflammation. There was an hiatal hernia with associated air-fluid level within the mid esophagus and emphysema, there was concern of aspiration pneumonia so he was started on cefepime and Zithromax in ED which was later transitioned to Zosyn.  In ED he transiently required BiPAP due to worsening shortness of breath.  Currently saturating well on 5 to 6 L of oxygen.   Assessment and Plan: * Severe sepsis ([facility]) Sepsis criteria includes  leukocytosis, lactic acidosis, tachypnea, hypoxia, AKI with respiratory failure, hypotension Secondary to pneumonia and likely UTI-urine cultures growing more than 100,000 colonies of gram-negative rod-pending identification and susceptibility.  Blood cultures remain negative. Patient also had a Foley catheter in place since prior admission when he developed urinary retention after the surgery. Received fluid per sepsis protocol. Initially received cefepime and Zithromax and later transitioned to Zosyn for concern of aspiration pneumonia. -Continue Zosyn-we will de-escalate once more culture data is available. -Remove Foley catheter and give him a voiding trial  Acute respiratory failure with hypoxia ([facility]) Tachypnea with O2 sat 86% on room air. Initially requiring 3 L of oxygen, he was placed on BiPAP transiently in ED due to worsening respiratory status.  There was some concern of CHF due to elevated BNP at 259 which is not very conclusive. Echocardiogram was ordered-pending. CTA with concern of emphysema although no baseline oxygen requirement.  Apparently developed hypoxia after hip replacement surgery and discharged home on 3 L of oxygen. -Continue supplemental oxygen-wean as tolerated.  Bilateral pneumonia Some concern for aspiration pneumonia given fluid-filled esophagus seen on CT, he was started on Zosyn. -Check procalcitonin-0.23 -Continue with Zosyn -Incentive spirometer -Antitussives -Supplemental oxygen -DuoNebs as needed -Maintain aspiration precautions -Start him on diet once swallow evaluation completed.  UTI (urinary tract infection) Urine cultures growing more than 100,000 colonies of gram-negative rods. -Continue Zosyn for now-we will de-escalate once more data is available  Hyponatremia- (present on admission) Resolved.  Possibly related to poor oral intake from acute illness -Continue to monitor  Status post right  hip replacement 08/01/21 No acute issues  suspected Surgical site appears well-healing.  Essential (primary) hypertension- (present on admission) Blood pressure within goal now. -Keep holding home amlodipine and losartan-we will restart as needed  Chronic obstructive pulmonary disease ([place])- (present on admission) Not acutely exacerbated DuoNebs as needed  AKI (acute kidney injury) ([place]) Secondary to sepsis Creatinine started improving. Baseline around 1 -Monitor renal function -Avoid nephrotoxins  Coronary artery disease involving native coronary artery of native heart without angina pectoris- (present on admission) No chest pain.  Mildly elevated troponin with a downward trend, most likely secondary to demand. -Continue home dose of statin  Elevated troponin- (present on admission) Troponin of 48, suspect related to demand Downward trend. Echocardiogram to evaluate for wall motion abnormality Patient is denying chest pain and EKG is nonacute  On continuous oral anticoagulation Since 2/14 for postoperative DVT prophylaxis We will give subcu Lovenox prophylaxis instead  Bilateral pleural effusion Trace bilateral pleural effusions No history of CHF Follow BNP Monitor for fluid overload in view of sepsis fluids We will get echocardiogram if not recently done  Anemia Hemoglobin trending down from recent hospitalization Continue to monitor CBC Latest Ref Rng & Units 08/10/2021 08/09/2021 08/08/2021  WBC 4.0 - 10.5 K/uL 9.5 12.6(H) 13.8(H)  Hemoglobin 13.0 - 17.0 g/dL 7.2(L) 7.5(L) 7.4(L)  Hematocrit 39.0 - 52.0 % 24.0(L) 24.7(L) 24.7(L)  Platelets 150 - 400 K/uL 305 290 325   -Continue home iron and B12 supplement -Give her 1 unit of packed RBC due to hemoglobin trending down.  Prostate cancer [facility])- (present on admission) No acute issues  Abdominal aortic aneurysm (AAA) without rupture- (present on admission) No acute issues suspected at this time  Pressure Injury 08/04/21 Buttocks Right Stage 2 -  Partial  thickness loss of dermis presenting as a shallow open injury with a red, pink wound bed without slough. small skin tears (Active)  08/04/21 0900  Location: Buttocks  Location Orientation: Right  Staging: Stage 2 -  Partial thickness loss of dermis presenting as a shallow open injury with a red, pink wound bed without slough.  Wound Description (Comments): small skin tears  Present on Admission:     Subjective: Patient continued to have some cough.  Oxygen requirement improving.  Son at bedside.  Patient is a [place] speaking and very hard of hearing, most of the communication was done with the help of son.  Physical Exam: Vitals:   08/10/21 1145 08/10/21 1428 08/10/21 1500 08/10/21 1617  BP: 119/75 137/65  135/68  Pulse: 86 99 96 95  Resp: 18 18 14    Temp: 98.4 F (36.9 C) (!) 97.3 F (36.3 C) 98.4 F (36.9 C)   TempSrc:  Oral Oral   SpO2: 97% 94% 95%   Weight:      Height:       General.  Frail elderly man, in no acute distress. Pulmonary.  Lungs clear bilaterally, normal respiratory effort. CV.  Regular rate and rhythm, no JVD, rub or murmur. Abdomen.  Soft, nontender, nondistended, BS positive. CNS.  Alert and oriented .  No focal neurologic deficit. Extremities.  No edema, no cyanosis, pulses intact and symmetrical. Psychiatry.  Judgment and insight appears normal.  Data Reviewed: I personally reviewed prior notes and labs.  Family Communication: Discussed with son at bedside  Disposition: Status is: Inpatient Remains inpatient appropriate because: Severity of illness.   Planned Discharge Destination: Home with Home Health  DVT prophylaxis.  Eliquis  Time spent: 45 minutes  This record  has been created using Systems analyst. Errors have been sought and corrected,but may not always be located. Such creation errors do not reflect on the standard of care.  Author: Lorella Nimrod, MD 08/10/2021 4:54 PM  For on call review [email or web address].

## 2021-08-10 NOTE — Assessment & Plan Note (Signed)
Tachypnea with O2 sat 86% on room air. Initially requiring 3 L of oxygen, he was placed on BiPAP transiently in ED due to worsening respiratory status.  There was some concern of CHF due to elevated BNP at 259 which is not very conclusive. Echocardiogram was ordered-pending. CTA with concern of emphysema although no baseline oxygen requirement.  Apparently developed hypoxia after hip replacement surgery and discharged home on 3 L of oxygen. -Continue supplemental oxygen-wean as tolerated.

## 2021-08-10 NOTE — Progress Notes (Signed)
Mobility Specialist - Progress Note   08/10/21 1700  Mobility  Activity Ambulated with assistance in hallway  Level of Assistance Standby assist, set-up cues, supervision of patient - no hands on  Assistive Device Front wheel walker  Distance Ambulated (ft) 200 ft  Activity Response Tolerated well  $Mobility charge 1 Mobility    Pre-mobility: 96 HR, 94% SpO2 Post-mobility: 98 HR, 91% SpO2   Pt lying in bed upon arrival, utilizing 2L. Wife at bedside. Pt able to exit bed with minA, cues for following s/p posterior hip precautions. Pt ambulated in hallway with supervision. Mildly fatigued. Impulsive, cues for pacing activity. HR peaked at 128 bpm with ambulation. Pt returned to bed with alarm set, needs in reach, back on 2L. RN notified.    Kathee Delton Mobility Specialist 08/10/21, 5:04 PM

## 2021-08-10 NOTE — Assessment & Plan Note (Signed)
Resolved.  Possibly related to poor oral intake from acute illness -Continue to monitor

## 2021-08-10 NOTE — Progress Notes (Addendum)
Physical Therapy Treatment Patient Details Name: Ruben Green MRN: [id number] DOB: 06-23-1933 Today's Date: 08/10/2021   History of Present Illness Pt. is an 88 y.o. male s/p Posterior R THA on 2/14. Recently admitted due to c/o lightheadedness (2/21), Severe Sepsis, acute respiratory failure w/ hypoxia, bilateral pneumonia. EMS reported a BP of 60/40 and ED reported BP of 102/57. PmHx: CAD, COPD, HTN, Prostate Cancer, AAA    PT Comments    Pt awake and alert resting in bed w/ son at bedside upon PT entrance into room today. Pt reports he is still having some right hip pain/discomfort, but his chest pain has gone down. He is able to perform bed mobility w/minA; with reliance on bed rails and PT assistance for RLE transfer in order to maintain R posterior hip precautions. Once seated EOB, Pt is able to perform sit to stand w/CGA using RW. He was able to ambulate ~120ft w/ CGA and RW; PT led rest breaks were incorporated throughout in order to obtain SpO2 vital readings throughout ambulation. Ambulation on 3L due to poor pleth readings, but returned to 2L at rest with spO2 >90%. Pt returned to recliner w/ all needs within reach. He will benefit from continued skilled PT in order to increase LE strength, improve mobility/gait, and restore PLOF. Current discharge recommendation remains appropriate due to the level of assistance required by the patient to ensure safety and improve overall function.   Recommendations for follow up therapy are one component of a multi-disciplinary discharge planning process, led by the attending physician.  Recommendations may be updated based on patient status, additional functional criteria and insurance authorization.  Follow Up Recommendations  Home health PT     Assistance Recommended at Discharge Frequent or constant Supervision/Assistance  Patient can return home with the following A little help with walking and/or transfers;A little help with  bathing/dressing/bathroom;Assist for transportation;Help with stairs or ramp for entrance;Assistance with cooking/housework   Equipment Recommendations  None recommended by PT    Recommendations for Other Services       Precautions / Restrictions Precautions Precautions: Posterior Hip;Fall Precaution Booklet Issued: Yes (comment) Restrictions Weight Bearing Restrictions: Yes RLE Weight Bearing: Weight bearing as tolerated     Mobility  Bed Mobility Overal bed mobility: Needs Assistance Bed Mobility: Supine to Sit     Supine to sit: Min assist     General bed mobility comments: minA for R LE transfer to maintain R Post. hip precautions    Transfers Overall transfer level: Needs assistance Equipment used: Rolling walker (2 wheels) Transfers: Sit to/from Stand Sit to Stand: Min guard                Ambulation/Gait Ambulation/Gait assistance: Min guard Gait Distance (Feet): 160 Feet Assistive device: Rolling walker (2 wheels) Gait Pattern/deviations: Step-through pattern, Decreased step length - right, Decreased step length - left, Decreased stride length Gait velocity: decreased     General Gait Details: Pt was eager/impulsive w/ walking   Stairs             Wheelchair Mobility    Modified Rankin (Stroke Patients Only)       Balance Overall balance assessment: Needs assistance Sitting-balance support: Feet supported Sitting balance-Leahy Scale: Good     Standing balance support: Bilateral upper extremity supported, During functional activity, Reliant on assistive device for balance Standing balance-Leahy Scale: Good  Cognition Arousal/Alertness: Awake/alert Behavior During Therapy: WFL for tasks assessed/performed Overall Cognitive Status: Within Functional Limits for tasks assessed                                          Exercises      General Comments        Pertinent  Vitals/Pain Pain Assessment Pain Assessment: Faces Pain Location: Right Hip Pain Descriptors / Indicators: Grimacing, Aching, Discomfort, Sore Pain Intervention(s): Limited activity within patient's tolerance, Monitored during session, Repositioned    Home Living                          Prior Function            PT Goals (current goals can now be found in the care plan section) Progress towards PT goals: Progressing toward goals    Frequency    7X/week      PT Plan Current plan remains appropriate    Co-evaluation              AM-PAC PT "6 Clicks" Mobility   Outcome Measure  Help needed turning from your back to your side while in a flat bed without using bedrails?: A Little Help needed moving from lying on your back to sitting on the side of a flat bed without using bedrails?: A Little Help needed moving to and from a bed to a chair (including a wheelchair)?: A Little Help needed standing up from a chair using your arms (e.g., wheelchair or bedside chair)?: A Little Help needed to walk in hospital room?: A Little Help needed climbing 3-5 steps with a railing? : A Little 6 Click Score: 18    End of Session Equipment Utilized During Treatment: Gait belt Activity Tolerance: Patient tolerated treatment well Patient left: in chair;with chair alarm set;with call bell/phone within reach;with family/visitor present Nurse Communication: Mobility status PT Visit Diagnosis: Muscle weakness (generalized) (M62.81);Difficulty in walking, not elsewhere classified (R26.2);Pain Pain - Right/Left: Right Pain - part of body: Hip     Time: 1005-1039 PT Time Calculation (min) (ACUTE ONLY): 34 min  Charges:                         Jonnie Kind, SPT 08/10/2021, 12:48 PM

## 2021-08-10 NOTE — Assessment & Plan Note (Signed)
Urine cultures growing more than 100,000 colonies of gram-negative rods. -Continue Zosyn for now-we will de-escalate once more data is available

## 2021-08-10 NOTE — Assessment & Plan Note (Signed)
Secondary to sepsis Creatinine started improving. Baseline around 1 -Monitor renal function -Avoid nephrotoxins

## 2021-08-10 NOTE — Progress Notes (Signed)
Cross Cover Dose of melatonin ordered for patient request of sleep aid

## 2021-08-10 NOTE — Assessment & Plan Note (Signed)
Some concern for aspiration pneumonia given fluid-filled esophagus seen on CT, he was started on Zosyn. -Check procalcitonin-0.23 -Continue with Zosyn -Incentive spirometer -Antitussives -Supplemental oxygen -DuoNebs as needed -Maintain aspiration precautions -Start him on diet once swallow evaluation completed.

## 2021-08-10 NOTE — Assessment & Plan Note (Signed)
Hemoglobin trending down from recent hospitalization Continue to monitor CBC Latest Ref Rng & Units 08/10/2021 08/09/2021 08/08/2021  WBC 4.0 - 10.5 K/uL 9.5 12.6(H) 13.8(H)  Hemoglobin 13.0 - 17.0 g/dL 7.2(L) 7.5(L) 7.4(L)  Hematocrit 39.0 - 52.0 % 24.0(L) 24.7(L) 24.7(L)  Platelets 150 - 400 K/uL 305 290 325   -Continue home iron and B12 supplement -Give her 1 unit of packed RBC due to hemoglobin trending down.

## 2021-08-10 NOTE — Assessment & Plan Note (Signed)
Sepsis criteria includes leukocytosis, lactic acidosis, tachypnea, hypoxia, AKI with respiratory failure, hypotension Secondary to pneumonia and likely UTI-urine cultures growing more than 100,000 colonies of gram-negative rod-pending identification and susceptibility.  Blood cultures remain negative. Patient also had a Foley catheter in place since prior admission when he developed urinary retention after the surgery. Received fluid per sepsis protocol. Initially received cefepime and Zithromax and later transitioned to Zosyn for concern of aspiration pneumonia. -Continue Zosyn-we will de-escalate once more culture data is available. -Remove Foley catheter and give him a voiding trial

## 2021-08-10 NOTE — Progress Notes (Signed)
7p-9p, attempted to void volitionally with encouragement. Bladder scan 294 at 7p; 9p was able to void 276ml, but bladder scan now reading 481. Replaced foley per order for retaining urine >300. Immediately 578ml pink-tinged UOP per Foley with pt expressing relief.

## 2021-08-11 LAB — URINE CULTURE: Culture: >=100000 — AB

## 2021-08-11 LAB — BPAM RBC
Blood Product Expiration Date: 202303082359
ISSUE DATE / TIME: 202302231437
Unit Type and Rh: 600

## 2021-08-11 LAB — BASIC METABOLIC PANEL
Anion gap: 5 (ref 5–15)
BUN: 19 mg/dL (ref 8–23)
CO2: 24 mmol/L (ref 22–32)
Calcium: 8 mg/dL — ABNORMAL LOW (ref 8.9–10.3)
Chloride: 107 mmol/L (ref 98–111)
Creatinine, Ser: 1.31 mg/dL — ABNORMAL HIGH (ref 0.61–1.24)
GFR, Estimated: 53 mL/min — ABNORMAL LOW (ref >60)
Glucose, Bld: 103 mg/dL — ABNORMAL HIGH (ref 70–99)
Potassium: 4.4 mmol/L (ref 3.5–5.1)
Sodium: 136 mmol/L (ref 135–145)

## 2021-08-11 LAB — CBC
HCT: 25.3 % — ABNORMAL LOW (ref 39.0–52.0)
Hemoglobin: 7.8 g/dL — ABNORMAL LOW (ref 13.0–17.0)
MCH: 26.8 pg (ref 26.0–34.0)
MCHC: 30.8 g/dL (ref 30.0–36.0)
MCV: 86.9 fL (ref 80.0–100.0)
Platelets: 325 10*3/uL (ref 150–400)
RBC: 2.91 MIL/uL — ABNORMAL LOW (ref 4.22–5.81)
RDW: 18.1 % — ABNORMAL HIGH (ref 11.5–15.5)
WBC: 7.9 10*3/uL (ref 4.0–10.5)
nRBC: 0.3 % — ABNORMAL HIGH (ref 0.0–0.2)

## 2021-08-11 LAB — TYPE AND SCREEN
ABO/RH(D): A NEG
Antibody Screen: NEGATIVE
Unit division: 0

## 2021-08-11 LAB — GLUCOSE, CAPILLARY: Glucose-Capillary: 143 mg/dL — ABNORMAL HIGH (ref 70–99)

## 2021-08-11 MED ORDER — LEVOFLOXACIN IN D5W 250 MG/50ML IV SOLN
250.0000 mg | INTRAVENOUS | Status: DC
  Filled 2021-08-11: qty 50

## 2021-08-11 MED ORDER — MELATONIN 5 MG PO TABS
5.0000 mg | ORAL_TABLET | Freq: Once | ORAL | Status: AC
  Administered 2021-08-11: 5 mg via ORAL
  Filled 2021-08-11: qty 1

## 2021-08-11 MED ORDER — ORAL CARE MOUTH RINSE
15.0000 mL | Freq: Two times a day (BID) | OROMUCOSAL | Status: DC
  Administered 2021-08-11 – 2021-08-12 (×3): 15 mL via OROMUCOSAL

## 2021-08-11 MED ORDER — LEVOFLOXACIN 500 MG PO TABS
250.0000 mg | ORAL_TABLET | Freq: Every day | ORAL | Status: DC
  Administered 2021-08-11 – 2021-08-12 (×2): 250 mg via ORAL
  Filled 2021-08-11 (×2): qty 1

## 2021-08-11 MED ORDER — LEVOFLOXACIN 250 MG PO TABS
250.0000 mg | ORAL_TABLET | Freq: Every day | ORAL | 0 refills | Status: DC
Start: 2021-08-11 — End: 2021-08-12

## 2021-08-11 NOTE — Progress Notes (Signed)
Physical Therapy Treatment Patient Details Name: Ruben Green MRN: [id number] DOB: 09/06/1933 Today's Date: 08/11/2021   History of Present Illness Pt. is an 88 y.o. male s/p Posterior R THA on 2/14. Recently admitted due to c/o lightheadedness (2/21), Severe Sepsis, acute respiratory failure w/ hypoxia, bilateral pneumonia. EMS reported a BP of 60/40 and ED reported BP of 102/57. PmHx: CAD, COPD, HTN, Prostate Cancer, AAA    PT Comments    Pt is alert and oriented x 3. Agreeable to session with supportive spouse present throughout. He continues to not be at baseline cognition since hip surgery last week, per spouse. Pt is slightly impulsive however able to follow commands consistently during session. Pt was able to exit R side of bed with min assist. Requires assistance more so due to pain than strength/physical limitations. Pt tolerated ambulation 200 ft in hallway with 2 L o2. Sao2 remained > 90% on 2 L. He had successful BM once returned to room prior to being repositioned in recliner for breakfast. Overall pt is doing well. Acute PT will continue to follow and progress as able per current POC.   Recommendations for follow up therapy are one component of a multi-disciplinary discharge planning process, led by the attending physician.  Recommendations may be updated based on patient status, additional functional criteria and insurance authorization.  Follow Up Recommendations  Home health PT     Assistance Recommended at Discharge Frequent or constant Supervision/Assistance  Patient can return home with the following A little help with walking and/or transfers;A little help with bathing/dressing/bathroom;Assist for transportation;Help with stairs or ramp for entrance;Assistance with cooking/housework   Equipment Recommendations  None recommended by PT       Precautions / Restrictions Precautions Precautions: Posterior Hip;Fall Precaution Booklet Issued: Yes  (comment) Restrictions Weight Bearing Restrictions: Yes RLE Weight Bearing: Weight bearing as tolerated     Mobility  Bed Mobility Overal bed mobility: Needs Assistance Bed Mobility: Supine to Sit     Supine to sit: Min assist     General bed mobility comments: Pt required increased time and min assist to exit R side of bed. Vcs for technique and sequencing improvements    Transfers Overall transfer level: Needs assistance Equipment used: Rolling walker (2 wheels) Transfers: Sit to/from Stand Sit to Stand: Supervision           General transfer comment: pt was able to stand from EOB and from BSC(placed over toilet) without physical assistance.    Ambulation/Gait Ambulation/Gait assistance: Supervision Gait Distance (Feet): 200 Feet Assistive device: Rolling walker (2 wheels) Gait Pattern/deviations: Step-through pattern, Decreased step length - right, Decreased step length - left, Decreased stride length Gait velocity: decreased     General Gait Details: pt was able to safely ambulate 200 ft. does require several vcs to slow down however overall was safe. on 2 L o2 throughout session with sao2 > 90%.    Balance Overall balance assessment: Needs assistance Sitting-balance support: Feet supported Sitting balance-Leahy Scale: Good     Standing balance support: Bilateral upper extremity supported, During functional activity, Reliant on assistive device for balance Standing balance-Leahy Scale: Good     Cognition Arousal/Alertness: Awake/alert Behavior During Therapy: WFL for tasks assessed/performed Overall Cognitive Status: Within Functional Limits for tasks assessed    General Comments: Pt is A and mostly oriented. Slightly impulsive at times. per spouse, he's cognition has not been great lately. He was able to consistently follow commands throughout  General Comments General comments (skin integrity, edema, etc.): discussed with pt/spouse DC  planning. Recommending HHPT to continue to follow at DC. Encoured hireing help if needed.      Pertinent Vitals/Pain Pain Assessment Pain Assessment: 0-10 Pain Score: 4  Faces Pain Scale: Hurts a little bit Pain Location: Right Hip Pain Descriptors / Indicators: Grimacing, Aching, Discomfort, Sore Pain Intervention(s): Limited activity within patient's tolerance, Monitored during session, Premedicated before session, Repositioned     PT Goals (current goals can now be found in the care plan section) Acute Rehab PT Goals Patient Stated Goal: to go home Progress towards PT goals: Progressing toward goals    Frequency    7X/week      PT Plan Current plan remains appropriate       AM-PAC PT "6 Clicks" Mobility   Outcome Measure  Help needed turning from your back to your side while in a flat bed without using bedrails?: A Little Help needed moving from lying on your back to sitting on the side of a flat bed without using bedrails?: A Little Help needed moving to and from a bed to a chair (including a wheelchair)?: A Little Help needed standing up from a chair using your arms (e.g., wheelchair or bedside chair)?: A Little Help needed to walk in hospital room?: A Little Help needed climbing 3-5 steps with a railing? : A Little 6 Click Score: 18    End of Session Equipment Utilized During Treatment: Oxygen (2 L o2 throughout) Activity Tolerance: Patient tolerated treatment well Patient left: in chair;with chair alarm set;with call bell/phone within reach;with family/visitor present Nurse Communication: Mobility status PT Visit Diagnosis: Muscle weakness (generalized) (M62.81);Difficulty in walking, not elsewhere classified (R26.2);Pain Pain - Right/Left: Right Pain - part of body: Hip     Time: 7741-2878 PT Time Calculation (min) (ACUTE ONLY): 25 min  Charges:  $Gait Training: 8-22 mins $Therapeutic Activity: 8-22 mins                    Julaine Fusi  PTA 08/11/21, 12:45 PM

## 2021-08-11 NOTE — Progress Notes (Signed)
Started discussing with Dr. Reesa Chew via secure chat at 1310 that patient and his son would like to speak with her again regarding their concerns about patient being discharged. Patient does not feel like he is ready to be discharged and states he does not feel well enough to go home. Explained to patient and his son that patient will continue to get antibiotics once discharged at home. Patients son states that Dr. Reesa Chew told him earlier today that the patient could stay one more night. All of this discussed via secure chat with MD and this RN asked Dr. Reesa Chew to come back to room and discuss care with patient and his son along with this RN because patient's son asking to speak with MD again about concerns. Dr. Reesa Chew acknowledged and stated she was working on other discharges and that patient has been medically cleared and if he does not want to go home he must appeal the discharge. Contacted TOC Meagan Hagwood to come speak with patient and son.

## 2021-08-11 NOTE — TOC Transition Note (Signed)
Transition of Care [facility]) - CM/SW Discharge Note   Patient Details  Name: Ruben Green MRN: [id number] Date of Birth: [birth year]  [facility] CM/SW Contact:  Magnus Ivan, LCSW Phone Number: 08/11/2021, 1:36 PM   Clinical Narrative:   Notified Meg with [facility] that patient is being discharged home today. HH orders are in.     Final next level of care: [place] Barriers to Discharge: Barriers Resolved   Patient Goals and CMS Choice        Discharge Placement                       Discharge Plan and Services                          HH Arranged: PT, OT, RN [facility] Agency: [place] Date [facility] Agency Contacted: 08/11/21   Representative spoke with at [place]: Meg  Social Determinants of Health ([place]) Interventions     Readmission Risk Interventions No flowsheet data found.

## 2021-08-11 NOTE — Care Management Important Message (Signed)
Important Message  Patient Details  Name: Ruben Green MRN: [id number] Date of Birth: July 20, 1933   Medicare Important Message Given:  N/A - LOS <3 / Initial given by admissions     [name] 08/11/2021, 10:19 AM

## 2021-08-11 NOTE — TOC Progression Note (Addendum)
[employer] Appeal Detailed Notice of Discharge letter created and saved: Yes Detailed Notice of Discharge Document Given to [employer]: Yes [employer] ROI Document Created: Yes [employer] appeal documents uploaded to [employer] stite: Yes   CASE ID #: [id number] CONFIRMATION # [id number]

## 2021-08-11 NOTE — Discharge Summary (Addendum)
Physician Discharge Summary   Patient: Ruben Green MRN: [id number] DOB: Oct 23, 1933  Admit date:     08/08/2021  Discharge date: 08/12/21  Discharge Physician: Lorella Nimrod   PCP: Tracie Harrier, MD   Recommendations at discharge:  Please obtain CBC and BMP in 1 week Follow-up with urology for your Foley catheter needs Follow-up with your orthopedic surgeon for your right hip replacement surgery Follow-up with your primary care provider.  Discharge Diagnoses: Principal Problem:   Severe sepsis ([place]) Active Problems:   Acute respiratory failure with hypoxia (HCC)   Bilateral pneumonia   UTI (urinary tract infection)   Hyponatremia   Status post right hip replacement 08/01/21   Essential (primary) hypertension   Chronic obstructive pulmonary disease (HCC)   AKI (acute kidney injury) ([place])   Coronary artery disease involving native coronary artery of native heart without angina pectoris   Elevated troponin   Abdominal aortic aneurysm (AAA) without rupture   Prostate cancer (HCC)   Anemia   Bilateral pleural effusion   On continuous oral anticoagulation   Hospital Course: Taken from H&P.  Ruben Green is a 88 y.o. male with medical history significant of CAD, COPD, HTN, prostate cancer, AAA, who is s/p right hip replacement on 5/46, complicated by postoperative hypoxia, ruled out for PE with CTA chest, also with finding of postoperative anemia of 7.8, which remained stable for which she did not receive transfusions, and discharged in stable condition, and who is currently on Eliquis for DVT prophylaxis postop, who presents to the ED with a complaint of lightheadedness, sore throat and cough and cold symptoms.  He denies fever or chills, chest pain, vomiting or diarrhea or abdominal pain.  EMS reports a blood pressure of 60/40 and O2 sat 88% on room air requiring 3 L with improvement to 97%.   On arrival to ED his blood pressure was 102/57, responded to IV fluid.   He was found to have leukocytosis, lactic acidosis at 2.5 which has been now normalized.  Mildly positive troponin with downward trend.  Hemoglobin of 7.4, D-dimer of 2.71, mild AKI with creatinine of 1.35, sodium of 129, I UA with large leukocyte esterase and few bacteria, COVID and flu negative.  CTA was negative for PE but did show trace bilateral pleural effusions right greater than left, associated passive atelectasis of bilateral lower lobes with some concern of superimposed infection/inflammation. There was an hiatal hernia with associated air-fluid level within the mid esophagus and emphysema, there was concern of aspiration pneumonia so he was started on cefepime and Zithromax in ED which was later transitioned to Zosyn.  In ED he transiently required BiPAP due to worsening shortness of breath.  Currently saturating well on 5 to 6 L of oxygen.  2/23: Oxygen requirement improved to 2 L.  Urine cultures growing more than 100,000 colonies of gram-negative rods.  2/25; urine culture grew Serratia marcescens , he was discharged home yesterday on appropriate antibiotics.  Appealed the discharge as he was still feeling weak. Clinically remained stable and no new complaints today.  Patient initially met severe sepsis criteria with leukocytosis, lactic acidosis, tachypnea and AKI.  Urine cultures with Serratia marcescens service.  He received Zosyn while in the hospital and discharged on Levaquin based on sensitivity results.  Levaquin will cover up for any possible pneumonia or superadded bacterial infection with bronchitis.  For his dyspnea, we obtained BNP which was mildly elevated at 259.  Echocardiogram with normal EF and grade 1 diastolic dysfunction.  Patient continued to require up to 2 L of oxygen with much improved saturation.  We initially held his home antihypertensives due to softer blood pressure which improved now.  Blood pressure remained within goal without any antihypertensives.  We  discontinue his home amlodipine and he can resume his losartan from tomorrow.  Needs a close follow-up with PCP for further recommendations.  Patient has a Foley catheter placed after the procedure due to urinary retention.  We tried removing the Foley catheter to give him a voiding trial.  Patient was unable to void and started retaining.  Foley catheter was replaced overnight.  Difficult insertion and he did had some hematuria during that time.  Hematuria resolved before discharge.  Patient need to follow-up closely with urology for further recommendations.  He is being discharged home with Foley catheter.  He was advised to restart his home finasteride which he was not taking before, along with Flomax to improve his obstruction his Foley catheter can be removed.  Patient will continue with Eliquis till 08/16/2021 according to the advice of his orthopedic surgeon.  Patient needs to follow-up according to his schedule appointment for further recommendations regarding his right hip replacement surgery.  Patient was also found to have worsening of his normocytic anemia.  Received 1 unit of PRBC and he will continue his home supplements of iron and B12.  No obvious bleeding.  Patient will continue with the rest of his home medications except mentioned above and follow-up with his providers.  Assessment and Plan: * Severe sepsis ([place]) Sepsis criteria includes leukocytosis, lactic acidosis, tachypnea, hypoxia, AKI with respiratory failure, hypotension Secondary to pneumonia and likely UTI-urine cultures with Serratia marcescens this blood cultures remain negative. Patient also had a Foley catheter in place since prior admission when he developed urinary retention after the surgery. Received fluid per sepsis protocol. Initially received cefepime and Zithromax and later transitioned to Zosyn for concern of aspiration pneumonia. We will give him a unsuccessful voiding trial, Foley catheter was replaced due  to urinary retention. -Antibiotics were de-escalated to Levaquin based on susceptibility results, to complete a 7-day course due to complicated UTI  Acute respiratory failure with hypoxia ([place]) Tachypnea with O2 sat 86% on room air.  Uses 2 to 3 L at baseline since prior hospitalization. Initially requiring 3 L of oxygen, he was placed on BiPAP transiently in ED due to worsening respiratory status.  There was some concern of CHF due to elevated BNP at 259 which is not very conclusive. Echocardiogram with normal EF and grade 1 diastolic dysfunction. CTA with concern of emphysema although no chronic oxygen requirement.  Apparently developed hypoxia after hip replacement surgery and discharged home on 3 L of oxygen. -Continue supplemental oxygen-wean as tolerated.  Bilateral pneumonia Some concern for aspiration pneumonia given fluid-filled esophagus seen on CT, he was started on Zosyn. -Check procalcitonin-0.23 -Zosyn was switched with Levaquin -Incentive spirometer -Antitussives -Supplemental oxygen -DuoNebs as needed -Maintain aspiration precautions   UTI (urinary tract infection) Urine cultures with Serratia marcescens  -Switch Zosyn with Levaquin to complete a 7-day course  Hyponatremia- (present on admission) Resolved.  Possibly related to poor oral intake from acute illness -Continue to monitor  Status post right hip replacement 08/01/21 No acute issues suspected Surgical site appears well-healing.  Essential (primary) hypertension- (present on admission) Blood pressure within goal. -Losartan was restarted and we will stop amlodipine for discharge.  Chronic obstructive pulmonary disease ([place])- (present on admission) Not acutely exacerbated DuoNebs as needed  AKI (  acute kidney injury) ([place]) Secondary to sepsis Creatinine started improving. Baseline around 1 -Monitor renal function -Avoid nephrotoxins  Coronary artery disease involving native coronary artery of native  heart without angina pectoris- (present on admission) No chest pain.  Mildly elevated troponin with a downward trend, most likely secondary to demand. -Continue home dose of statin  Elevated troponin- (present on admission) Troponin of 48, suspect related to demand Downward trend. Echocardiogram to evaluate for wall motion abnormality Patient is denying chest pain and EKG is nonacute  On continuous oral anticoagulation Since 2/14 for postoperative DVT prophylaxis We will give subcu Lovenox prophylaxis instead  Bilateral pleural effusion Trace bilateral pleural effusions No history of CHF Follow BNP Monitor for fluid overload in view of sepsis fluids We will get echocardiogram if not recently done  Anemia Hemoglobin trending down from recent hospitalization Continue to monitor CBC Latest Ref Rng & Units 08/10/2021 08/09/2021 08/08/2021  WBC 4.0 - 10.5 K/uL 9.5 12.6(H) 13.8(H)  Hemoglobin 13.0 - 17.0 g/dL 7.2(L) 7.5(L) 7.4(L)  Hematocrit 39.0 - 52.0 % 24.0(L) 24.7(L) 24.7(L)  Platelets 150 - 400 K/uL 305 290 325   -Continue home iron and B12 supplement -Give her 1 unit of packed RBC due to hemoglobin trending down.  Prostate cancer ([place])- (present on admission) No acute issues  Abdominal aortic aneurysm (AAA) without rupture- (present on admission) No acute issues suspected at this time   Consultants: None Procedures performed: None Disposition: Home health Diet recommendation:  Discharge Diet Orders (From admission, onward)     Start     Ordered   08/11/21 0000  Diet - low sodium heart healthy        08/11/21 1333           Cardiac diet  DISCHARGE MEDICATION: Allergies as of 08/12/2021       Reactions   Oxycodone Other (See Comments)   Caused hallucinations/does not want anymore        Medication List     STOP taking these medications    amLODipine 10 MG tablet Commonly known as: NORVASC       TAKE these medications    acetaminophen 500 MG  tablet Commonly known as: TYLENOL Take 1-2 tablets (500-1,000 mg total) by mouth every 6 (six) hours as needed for mild pain.   albuterol 108 (90 Base) MCG/ACT inhaler Commonly known as: VENTOLIN HFA Inhale 2 puffs into the lungs every 6 (six) hours as needed for wheezing or shortness of breath.   apixaban 2.5 MG Tabs tablet Commonly known as: ELIQUIS Take 1 tablet (2.5 mg total) by mouth 2 (two) times daily.   atorvastatin 40 MG tablet Commonly known as: LIPITOR Take 40 mg by mouth daily.   finasteride 5 MG tablet Commonly known as: Proscar Take 1 tablet (5 mg total) by mouth daily.   gabapentin 100 MG capsule Commonly known as: NEURONTIN Take 100 mg by mouth at bedtime as needed (pain).   iron polysaccharides 150 MG capsule Commonly known as: NIFEREX Take 150 mg by mouth daily.   levofloxacin 250 MG tablet Commonly known as: LEVAQUIN Take 1 tablet (250 mg total) by mouth daily for 5 days.   LORazepam 0.5 MG tablet Commonly known as: ATIVAN Take 0.5 mg by mouth every 8 (eight) hours as needed for anxiety.   losartan 100 MG tablet Commonly known as: COZAAR Take 100 mg by mouth daily.   ondansetron 4 MG tablet Commonly known as: ZOFRAN Take 1 tablet (4 mg total) by mouth every  6 (six) hours as needed for nausea.   tamsulosin 0.4 MG Caps capsule Commonly known as: FLOMAX Take 1 capsule (0.4 mg total) by mouth daily.   tiZANidine 4 MG tablet Commonly known as: Zanaflex Take 1 tablet (4 mg total) by mouth every 8 (eight) hours as needed for muscle spasms.   Trelegy Ellipta 100-62.5-25 MCG/ACT Aepb Generic drug: Fluticasone-Umeclidin-Vilant Inhale 2 puffs into the lungs daily.   vitamin B-12 1000 MCG tablet Commonly known as: CYANOCOBALAMIN Take 1,000 mcg by mouth daily.               Discharge Care Instructions  (From admission, onward)           Start     Ordered   08/11/21 0000  Leave dressing on - Keep it clean, dry, and intact until clinic  visit        08/11/21 1333            Follow-up Information     Hande, Cherlyn Labella, MD. Schedule an appointment as soon as possible for a visit in 1 week(s).   Specialty: Internal Medicine Contact information: [address] [place] [address] [phone]                 Discharge Exam: Danley Danker Weights   08/08/21 2104  Weight: 78 kg   General.     In no acute distress. Pulmonary.  Lungs clear bilaterally, normal respiratory effort. CV.  Regular rate and rhythm, no JVD, rub or murmur. Abdomen.  Soft, nontender, nondistended, BS positive. CNS.  Alert and oriented .  No focal neurologic deficit. Extremities.  No edema, no cyanosis, pulses intact and symmetrical. Psychiatry.  Judgment and insight appears normal.   Condition at discharge: stable  The results of significant diagnostics from this hospitalization (including imaging, microbiology, ancillary and laboratory) are listed below for reference.   Imaging Studies: CT Angio Chest PE W and/or Wo Contrast  Result Date: 08/08/2021 CLINICAL DATA:  Pulmonary embolism (PE) suspected, positive D-dimer. Low saturations EXAM: CT ANGIOGRAPHY CHEST WITH CONTRAST TECHNIQUE: Multidetector CT imaging of the chest was performed using the standard protocol during bolus administration of intravenous contrast. Multiplanar CT image reconstructions and MIPs were obtained to evaluate the vascular anatomy. RADIATION DOSE REDUCTION: This exam was performed according to the departmental dose-optimization program which includes automated exposure control, adjustment of the mA and/or kV according to patient size and/or use of iterative reconstruction technique. CONTRAST:  155mL OMNIPAQUE IOHEXOL 350 MG/ML SOLN COMPARISON:  Chest x-ray 08/08/2021 FINDINGS: Cardiovascular: Satisfactory opacification of the pulmonary arteries to the segmental level. No evidence of pulmonary embolism. Normal heart size. No significant  pericardial effusion. The thoracic aorta is normal in caliber. Severe atherosclerotic plaque of the thoracic aorta. Four-vessel coronary artery calcifications. Mediastinum/Nodes: No enlarged mediastinal, hilar, or axillary lymph nodes. Thyroid gland, trachea, and esophagus demonstrate no significant findings. Moderate volume hiatal hernia. Air-fluid level within the midesophagus. Lungs/Pleura: Paraseptal and centrilobular emphysematous changes. Bilateral lower lobe subsegmental atelectasis that appears slightly heterogeneous with air bronchograms. No pulmonary nodule. No pulmonary mass. Trace right pleural effusion. Trace left pleural effusion. No pneumothorax. Upper Abdomen: No acute abnormality. Musculoskeletal: No chest wall abnormality. Diffusely decreased bone density. No suspicious lytic or blastic osseous lesions. No acute displaced fracture. Multilevel degenerative changes of the spine. Review of the MIP images confirms the above findings. IMPRESSION: 1. No pulmonary embolus. 2. Trace bilateral pleural effusions, right greater than left. Associated passive atelectasis of bilateral lower lobes  with superimposed infection/inflammation not excluded. Followup PA and lateral chest X-ray is recommended in 3-4 weeks following therapy to ensure resolution. 3. Moderate volume hiatal hernia. Associated air-fluid level within the mid esophagus. Limited evaluation due to timing of contrast. 4. Aortic Atherosclerosis (ICD10-I70.0) including four-vessel coronary calcification. 5.  Emphysema (ICD10-J43.9). 6. Diffusely decreased bone density. Electronically Signed   By: Iven Finn M.D.   On: 08/08/2021 22:35   CT Angio Chest Pulmonary Embolism (PE) W or WO Contrast  Result Date: 08/03/2021 CLINICAL DATA:  Low oxygen saturations, status post right hip total arthroplasty EXAM: CT ANGIOGRAPHY CHEST WITH CONTRAST TECHNIQUE: Multidetector CT imaging of the chest was performed using the standard protocol during bolus  administration of intravenous contrast. Multiplanar CT image reconstructions and MIPs were obtained to evaluate the vascular anatomy. RADIATION DOSE REDUCTION: This exam was performed according to the departmental dose-optimization program which includes automated exposure control, adjustment of the mA and/or kV according to patient size and/or use of iterative reconstruction technique. CONTRAST:  74mL OMNIPAQUE IOHEXOL 350 MG/ML SOLN COMPARISON:  10/19/2016 FINDINGS: Cardiovascular: Satisfactory opacification of the pulmonary arteries to the segmental level. No evidence of pulmonary embolism. Normal heart size. Three-vessel coronary artery calcifications. No pericardial effusion. Aortic atherosclerosis. Mediastinum/Nodes: No enlarged mediastinal, hilar, or axillary lymph nodes. Moderate hiatal hernia with intrathoracic position of the gastric fundus. Thyroid gland, trachea, and esophagus demonstrate no significant findings. Lungs/Pleura: Moderate centrilobular and paraseptal emphysema. Pulmonary hyperinflation. Dependent bibasilar atelectasis or consolidation with scant frothy debris present in the right lower lobe airways (series 6, image 58). No pleural effusion or pneumothorax. Upper Abdomen: No acute abnormality. Musculoskeletal: No chest wall abnormality. No acute osseous findings. Review of the MIP images confirms the above findings. IMPRESSION: 1. Negative examination for pulmonary embolism. 2. Dependent bibasilar atelectasis or consolidation with scant frothy debris present in the right lower lobe airways. Findings suggest aspiration. 3. Emphysema. 4. Coronary artery disease. 5. Hiatal hernia, which may place the patient at risk for aspiration. Aortic Atherosclerosis (ICD10-I70.0) and Emphysema (ICD10-J43.9). Electronically Signed   By: Delanna Ahmadi M.D.   On: 08/03/2021 08:25   US Venous Img Lower Unilateral Right (DVT)  Result Date: 07/17/2021 CLINICAL DATA:  Right lower extremity swelling for 2  weeks, pain, straight bladder and prostate cancer EXAM: RIGHT LOWER EXTREMITY VENOUS DOPPLER ULTRASOUND TECHNIQUE: Gray-scale sonography with compression, as well as color and duplex ultrasound, were performed to evaluate the deep venous system(s) from the level of the common femoral vein through the popliteal and proximal calf veins. COMPARISON:  None. FINDINGS: VENOUS Normal compressibility of the common femoral, superficial femoral, and popliteal veins, as well as the visualized calf veins. Visualized portions of profunda femoral vein and great saphenous vein unremarkable. No filling defects to suggest DVT on grayscale or color Doppler imaging. Doppler waveforms show normal direction of venous flow, normal respiratory plasticity and response to augmentation. Limited views of the contralateral common femoral vein are unremarkable. OTHER None. Limitations: none IMPRESSION: 1. No evidence of deep venous thrombosis within the right lower extremity. Electronically Signed   By: Randa Ngo M.D.   On: 07/17/2021 16:17   DG Chest Portable 1 View  Result Date: 08/09/2021 CLINICAL DATA:  Fluctuating pulse oximeter readings with possible congestive heart failure. EXAM: PORTABLE CHEST 1 VIEW COMPARISON:  August 08, 2021 FINDINGS: Stable, diffuse, chronic appearing increased interstitial lung markings are seen. Mild, stable areas of atelectasis and/or infiltrate are seen within the bilateral lung bases, right greater than left. There is no  evidence of a pleural effusion. The cardiac silhouette is mildly enlarged and unchanged in size. Marked severity calcification of the thoracic aorta is noted. The visualized skeletal structures are unremarkable. IMPRESSION: Mild, stable bibasilar atelectasis and/or infiltrate, right greater than left. Electronically Signed   By: Virgina Norfolk M.D.   On: 08/09/2021 00:27   DG Chest Portable 1 View  Result Date: 08/08/2021 CLINICAL DATA:  lo sats EXAM: PORTABLE CHEST 1 VIEW  COMPARISON:  Chest x-ray 08/03/2021 FINDINGS: The heart and mediastinal contours are unchanged. Aortic calcification. Interval development of right lower lobe airspace opacity. Chronic coarsened markings with no overt pulmonary edema. No pleural effusion. No pneumothorax. No acute osseous abnormality. IMPRESSION: 1. Interval development of right lower lobe airspace opacity right likely representing developing infection/inflammation. Followup PA and lateral chest X-ray is recommended in 3-4 weeks following therapy to ensure resolution. 2. Aortic Atherosclerosis (ICD10-I70.0) and Emphysema (ICD10-J43.9). Electronically Signed   By: Iven Finn M.D.   On: 08/08/2021 22:07   DG Chest Port 1 View  Result Date: 08/03/2021 CLINICAL DATA:  Oxygen desaturation EXAM: PORTABLE CHEST 1 VIEW COMPARISON:  10/19/2016 FINDINGS: Bibasilar atelectasis. No focal airspace consolidation or pulmonary edema. Normal cardiomediastinal contours and pleural spaces. IMPRESSION: Bibasilar atelectasis. Electronically Signed   By: Ulyses Jarred M.D.   On: 08/03/2021 01:49   ECHOCARDIOGRAM COMPLETE  Result Date: 08/10/2021    ECHOCARDIOGRAM REPORT   Patient Name:   Ruben Green Date of Exam: 08/10/2021 Medical Rec #:  [id number]           Height:       62.0 in Accession #:    [id number]          Weight:       172.0 lb Date of Birth:  Jan 26, 1934           BSA:          1.793 m Patient Age:    43 years            BP:           119/75 mmHg Patient Gender: M                   HR:           72 bpm. Exam Location:  [facility] Procedure: 2D Echo, Cardiac Doppler and Color Doppler Indications:     I21.4 NSTEMI  History:         Patient has no prior history of Echocardiogram examinations.                  CAD, COPD, Arrythmias:RBBB, Signs/Symptoms:Dyspnea; Risk                  Factors:Hypertension and Dyslipidemia. COVID-19. AAA.  Sonographer:     Cresenciano Lick RDCS Referring Phys:  [id number] Athena Masse Diagnosing Phys: Yolonda Kida MD IMPRESSIONS  1. Left ventricular ejection fraction, by estimation, is 60 to 65%. The left ventricle has normal function. The left ventricle has no regional wall motion abnormalities. Left ventricular diastolic parameters are consistent with Grade I diastolic dysfunction (impaired relaxation).  2. Right ventricular systolic function is normal. The right ventricular size is normal.  3. Right atrial size was mild to moderately dilated.  4. The mitral valve is normal in structure. No evidence of mitral valve regurgitation.  5. The aortic valve is grossly normal. Aortic valve regurgitation is not visualized. FINDINGS  Left Ventricle: Left ventricular ejection fraction, by estimation,  is 60 to 65%. The left ventricle has normal function. The left ventricle has no regional wall motion abnormalities. The left ventricular internal cavity size was normal in size. There is  borderline left ventricular hypertrophy. Left ventricular diastolic parameters are consistent with Grade I diastolic dysfunction (impaired relaxation). Right Ventricle: The right ventricular size is normal. No increase in right ventricular wall thickness. Right ventricular systolic function is normal. Left Atrium: Left atrial size was normal in size. Right Atrium: Right atrial size was mild to moderately dilated. Pericardium: There is no evidence of pericardial effusion. Mitral Valve: The mitral valve is normal in structure. No evidence of mitral valve regurgitation. Tricuspid Valve: The tricuspid valve is normal in structure. Tricuspid valve regurgitation is trivial. Aortic Valve: The aortic valve is grossly normal. Aortic valve regurgitation is not visualized. Pulmonic Valve: The pulmonic valve was normal in structure. Pulmonic valve regurgitation is not visualized. Aorta: The ascending aorta was not well visualized. IAS/Shunts: No atrial level shunt detected by color flow Doppler.  LEFT VENTRICLE PLAX 2D LVIDd:         5.40 cm   Diastology  LVIDs:         3.20 cm   LV e' medial:    7.29 cm/s LV PW:         1.00 cm   LV E/e' medial:  5.8 LV IVS:        1.00 cm   LV e' lateral:   6.64 cm/s LVOT diam:     2.20 cm   LV E/e' lateral: 6.4 LV SV:         41 LV SV Index:   23 LVOT Area:     3.80 cm  RIGHT VENTRICLE             IVC RV Basal diam:  3.80 cm     IVC diam: 1.80 cm RV S prime:     13.35 cm/s TAPSE (M-mode): 2.9 cm LEFT ATRIUM             Index        RIGHT ATRIUM           Index LA diam:        5.40 cm 3.01 cm/m   RA Area:     14.20 cm LA Vol (A2C):   40.9 ml 22.81 ml/m  RA Volume:   38.50 ml  21.47 ml/m LA Vol (A4C):   39.9 ml 22.26 ml/m LA Biplane Vol: 41.7 ml 23.26 ml/m  AORTIC VALVE LVOT Vmax:   61.40 cm/s LVOT Vmean:  45.600 cm/s LVOT VTI:    0.107 m  AORTA Ao Root diam: 3.60 cm MITRAL VALVE MV Area (PHT): 4.06 cm    SHUNTS MV Decel Time: 187 msec    Systemic VTI:  0.11 m MV E velocity: 42.40 cm/s  Systemic Diam: 2.20 cm MV A velocity: 94.70 cm/s MV E/A ratio:  0.45 Dwayne D Callwood MD Electronically signed by Yolonda Kida MD Signature Date/Time: 08/10/2021/5:05:17 PM    Final    DG HIP UNILAT W OR W/O PELVIS 2-3 VIEWS RIGHT  Result Date: 08/01/2021 CLINICAL DATA:  A 69 male at age 85 presents for evaluation of RIGHT hip replacement. EXAM: DG HIP (WITH OR WITHOUT PELVIS) 2-3V RIGHT COMPARISON:  Previous CT from March 21, 2021. FINDINGS: Post RIGHT hip arthroplasty. Femoral and acetabular components appear well positioned. No unexpected radiographic findings or fracture is noted. Surgical staples overlie the lateral aspect of the RIGHT hip and  there is evidence of gas in the soft tissues about the RIGHT hip. IMPRESSION: Post RIGHT hip arthroplasty without complicating features. Electronically Signed   By: Zetta Bills M.D.   On: 08/01/2021 10:26    Microbiology: Results for orders placed or performed during the hospital encounter of 08/08/21  Resp Panel by RT-PCR (Flu A&B, Covid) Nasopharyngeal Swab     Status: None    Collection Time: 08/08/21  9:02 PM   Specimen: Nasopharyngeal Swab; Nasopharyngeal(NP) swabs in vial transport medium  Result Value Ref Range Status   SARS Coronavirus 2 by RT PCR NEGATIVE NEGATIVE Final    Comment: (NOTE) SARS-CoV-2 target nucleic acids are NOT DETECTED.  The SARS-CoV-2 RNA is generally detectable in upper respiratory specimens during the acute phase of infection. The lowest concentration of SARS-CoV-2 viral copies this assay can detect is 138 copies/mL. A negative result does not preclude SARS-Cov-2 infection and should not be used as the sole basis for treatment or other patient management decisions. A negative result may occur with  improper specimen collection/handling, submission of specimen other than nasopharyngeal swab, presence of viral mutation(s) within the areas targeted by this assay, and inadequate number of viral copies(<138 copies/mL). A negative result must be combined with clinical observations, patient history, and epidemiological information. The expected result is Negative.  Fact Sheet for Patients:  EntrepreneurPulse.com.au  Fact Sheet for Healthcare Providers:  IncredibleEmployment.be  This test is no t yet approved or cleared by the [place] FDA and  has been authorized for detection and/or diagnosis of SARS-CoV-2 by FDA under an Emergency Use Authorization (EUA). This EUA will remain  in effect (meaning this test can be used) for the duration of the COVID-19 declaration under Section 564(b)(1) of the Act, 21 U.S.C.section 360bbb-3(b)(1), unless the authorization is terminated  or revoked sooner.       Influenza A by PCR NEGATIVE NEGATIVE Final   Influenza B by PCR NEGATIVE NEGATIVE Final    Comment: (NOTE) The Xpert Xpress SARS-CoV-2/FLU/RSV plus assay is intended as an aid in the diagnosis of influenza from Nasopharyngeal swab specimens and should not be used as a sole basis for treatment.  Nasal washings and aspirates are unacceptable for Xpert Xpress SARS-CoV-2/FLU/RSV testing.  Fact Sheet for Patients: EntrepreneurPulse.com.au  Fact Sheet for Healthcare Providers: IncredibleEmployment.be  This test is not yet approved or cleared by the [place] FDA and has been authorized for detection and/or diagnosis of SARS-CoV-2 by FDA under an Emergency Use Authorization (EUA). This EUA will remain in effect (meaning this test can be used) for the duration of the COVID-19 declaration under Section 564(b)(1) of the Act, 21 U.S.C. section 360bbb-3(b)(1), unless the authorization is terminated or revoked.  Performed at [facility], [address] [place], [place] [address]   Culture, blood (single)     Status: None (Preliminary result)   Collection Time: 08/08/21  9:02 PM   Specimen: BLOOD  Result Value Ref Range Status   Specimen Description BLOOD LEFT ASSIST CONTROL  Final   Special Requests   Final    BOTTLES DRAWN AEROBIC AND ANAEROBIC Blood Culture results may not be optimal due to an excessive volume of blood received in culture bottles   Culture   Final    NO GROWTH 4 DAYS Performed at [facility], [address] [place], [place] [address]    Report Status PENDING  Incomplete  Urine Culture     Status: Abnormal   Collection Time: 08/08/21  9:02 PM  Specimen: Urine, Random  Result Value Ref Range Status   Specimen Description   Final    URINE, RANDOM Performed at [facility], [address] [place], [place] [address]    Special Requests   Final    NONE Performed at [facility], [address] [place], [place] [address]    Culture >=100,000 COLONIES/mL SERRATIA MARCESCENS (A)  Final   Report Status 08/11/2021 FINAL  Final   Organism ID, Bacteria SERRATIA MARCESCENS (A)  Final      Susceptibility   Serratia marcescens - MIC*    CEFAZOLIN >=64 RESISTANT Resistant      CEFEPIME <=0.12 SENSITIVE Sensitive     CEFTRIAXONE <=0.25 SENSITIVE Sensitive     CIPROFLOXACIN <=0.25 SENSITIVE Sensitive     GENTAMICIN <=1 SENSITIVE Sensitive     NITROFURANTOIN 128 RESISTANT Resistant     TRIMETH/SULFA <=20 SENSITIVE Sensitive     * >=100,000 COLONIES/mL SERRATIA MARCESCENS  Group A Strep by PCR ([facility] Only)     Status: None   Collection Time: 08/08/21 11:13 PM   Specimen: Throat; Sterile Swab  Result Value Ref Range Status   Group A Strep by PCR NOT DETECTED NOT DETECTED Final    Comment: Performed at [facility], [address] [place], [place] [address]    Labs: CBC: Recent Labs  Lab 08/08/21 2102 08/09/21 0402 08/10/21 0608 08/10/21 1920 08/11/21 0551  WBC 13.8* 12.6* 9.5  --  7.9  NEUTROABS 12.1*  --   --   --   --   HGB 7.4* 7.5* 7.2* 8.0* 7.8*  HCT 24.7* 24.7* 24.0* 25.6* 25.3*  MCV 90.1 90.1 89.9  --  86.9  PLT 325 290 305  --  449   Basic Metabolic Panel: Recent Labs  Lab 08/08/21 2102 08/09/21 0402 08/10/21 0608 08/11/21 0551 08/12/21 0936  NA 129* 135 137 136 136  K 4.7 4.2 4.5 4.4 4.7  CL 99 101 107 107 103  CO2 _0 GLUCOSE 204* 123* 107* 103* 177*  BUN 36* 29* 28* 19 17  CREATININE 1.35* 1.19 1.41* 1.31* 1.35*  CALCIUM 7.8* 8.1* 7.9* 8.0* 8.6*   Liver Function Tests: Recent Labs  Lab 08/08/21 2102 08/09/21 0402  AST 22 16  ALT 13 13  ALKPHOS 69 69  BILITOT 0.5 0.7  PROT 6.3* 6.0*  ALBUMIN 2.5* 2.5*   CBG: Recent Labs  Lab 08/11/21 1132  GLUCAP 143*    Discharge time spent: greater than 30 minutes.  Signed: Lorella Nimrod, MD [facility] 08/12/2021

## 2021-08-11 NOTE — TOC Progression Note (Addendum)
Transition of Care [facility]) - Progression Note    Patient Details  Name: Ruben Green MRN: [id number] Date of Birth: 1934-04-19  Transition of [facility]) CM/SW [place], LCSW Phone Number: 08/11/2021, 3:00 PM  Clinical Narrative:   Met with son Iona Beard at bedside. He states patient does not feel medically ready for discharge. Explained appeal rights. Provided copy of Medicare IM that was given by Admitting today per chart. Son states he does not understand why he has to appeal.  He says he was told this morning that patient can stay or discharge depending on how patient and family feel. Asked MD to speak with son again.  Also discussed HH. Iona Beard confirmed patient was set up with [facility] prior to this hospitalization and they want to continue this at DC.  3:10- MD spoke to son and explained patient medically cleared for DC. Son appealing through [employer].    Barriers to Discharge: Barriers Resolved  Expected Discharge Plan and Services           Expected Discharge Date: 08/11/21                         HH Arranged: PT, OT, RN [place] Agency: [place] Date [place]: 08/11/21   Representative spoke with at [place]: Meg   Social Determinants of Health ([place]) Interventions    Readmission Risk Interventions No flowsheet data found.

## 2021-08-11 NOTE — Discharge Instructions (Signed)
t was pleasure taking care of you. You are being given antibiotics for 5 days, please take it as directed. We stopped your home dose of amlodipine as your blood pressure was well maintained without it. You can resume your losartan from tomorrow, if you experience any dizziness please check your blood pressure and follow-up closely with your primary care provider for further management. You are also being discharged with Foley catheter, please restart taking your home finasteride along with Flomax as it will help with your bladder emptying and obstruction.  Follow-up closely with your urologist for further recommendations. Keep yourself well-hydrated and follow-up with your orthopedic surgeon for your right hip replacement. You will continue using Eliquis till 08/16/2021 to prevent any clots.

## 2021-08-12 LAB — BASIC METABOLIC PANEL
Anion gap: 9 (ref 5–15)
BUN: 17 mg/dL (ref 8–23)
CO2: 24 mmol/L (ref 22–32)
Calcium: 8.6 mg/dL — ABNORMAL LOW (ref 8.9–10.3)
Chloride: 103 mmol/L (ref 98–111)
Creatinine, Ser: 1.35 mg/dL — ABNORMAL HIGH (ref 0.61–1.24)
GFR, Estimated: 51 mL/min — ABNORMAL LOW (ref >60)
Glucose, Bld: 177 mg/dL — ABNORMAL HIGH (ref 70–99)
Potassium: 4.7 mmol/L (ref 3.5–5.1)
Sodium: 136 mmol/L (ref 135–145)

## 2021-08-12 MED ORDER — LEVOFLOXACIN 250 MG PO TABS: 250.0000 mg | ORAL_TABLET | Freq: Every day | ORAL | 0 refills | Status: AC

## 2021-08-12 NOTE — Assessment & Plan Note (Signed)
Tachypnea with O2 sat 86% on room air.  Uses 2 to 3 L at baseline since prior hospitalization. Initially requiring 3 L of oxygen, he was placed on BiPAP transiently in ED due to worsening respiratory status.  There was some concern of CHF due to elevated BNP at 259 which is not very conclusive. Echocardiogram with normal EF and grade 1 diastolic dysfunction. CTA with concern of emphysema although no chronic oxygen requirement.  Apparently developed hypoxia after hip replacement surgery and discharged home on 3 L of oxygen. -Continue supplemental oxygen-wean as tolerated.

## 2021-08-12 NOTE — Progress Notes (Signed)
PIV's removed. Discharge instructions completed. Patient verbalized understanding of medication regimen, follow up appointments and discharge instructions. Understands when to hold Losartan. Patient belongings gathered and packed to discharge. Waiting on son to bring O2 tank and will DC when O2 is available.

## 2021-08-12 NOTE — Assessment & Plan Note (Signed)
Secondary to sepsis Creatinine started improving. Baseline around 1 -Monitor renal function -Avoid nephrotoxins

## 2021-08-12 NOTE — Assessment & Plan Note (Signed)
Resolved.  Possibly related to poor oral intake from acute illness -Continue to monitor

## 2021-08-12 NOTE — Progress Notes (Signed)
2:14 pm - Son Iona Beard stated the prescriptions were sent to pharmacy but it closed early today. Requesting they be resent to [facility] on [address]. Notified MD.   Oleh Genin, LCSW [phone]

## 2021-08-12 NOTE — Progress Notes (Signed)
Urine output from Foley intermittently pink-tinged alternating with straw/yellow-colored overnight. No clots noted. Asymptomatic.

## 2021-08-12 NOTE — TOC Progression Note (Signed)
Transition of Care [facility]) - Progression Note    Patient Details  Name: Ruben Green MRN: [id number] Date of Birth: [birth year]  Transition of Care [facility]) CM/SW [place], LCSW Phone Number: 08/12/2021, 8:56 AM  Clinical Narrative:   CSW checked status of appeal. Still pending at this time. CSW will continue to monitor.       Barriers to Discharge: Barriers Resolved  Expected Discharge Plan and Services           Expected Discharge Date: 08/11/21                         HH Arranged: PT, OT, RN [place] Agency: [place] Date [place]: 08/11/21   Representative spoke with at [place]: Meg   Social Determinants of Health ([place]) Interventions    Readmission Risk Interventions No flowsheet data found.

## 2021-08-12 NOTE — Assessment & Plan Note (Signed)
Urine cultures with Serratia marcescens  -Switch Zosyn with Levaquin to complete a 7-day course

## 2021-08-12 NOTE — Assessment & Plan Note (Signed)
Some concern for aspiration pneumonia given fluid-filled esophagus seen on CT, he was started on Zosyn. -Check procalcitonin-0.23 -Zosyn was switched with Levaquin -Incentive spirometer -Antitussives -Supplemental oxygen -DuoNebs as needed -Maintain aspiration precautions

## 2021-08-12 NOTE — TOC Transition Note (Signed)
Transition of Care [facility]) - CM/SW Discharge Note   Patient Details  Name: DYON ROTERT MRN: [id number] Date of Birth: 1934-02-02  Transition of Care [facility]) CM/SW Contact:  Magnus Ivan, LCSW Phone Number: 08/12/2021, 12:28 PM   Clinical Narrative:   Per MD patient and family ready to DC today.  Spoke with son Iona Beard who says he will call Judithann Graves to cancel appeal.  Called Meg with [facility] who will call Iona Beard today to schedule [facility] visits.     Final next level of care: [place] Barriers to Discharge: Barriers Resolved   Patient Goals and CMS Choice Patient states their goals for this hospitalization and ongoing recovery are:: home with home health CMS Medicare.gov Compare Post Acute Care list provided to:: Patient Represenative (must comment) Choice offered to / list presented to : Adult Children  Discharge Placement                    Patient and family notified of of transfer: 08/12/21  Discharge Plan and Services                          HH Arranged: PT, OT, RN [facility] Agency: [place] Date [place]: 08/12/21   Representative spoke with at [place]: Meg  Social Determinants of Health ([place]) Interventions     Readmission Risk Interventions No flowsheet data found.

## 2021-08-12 NOTE — Assessment & Plan Note (Signed)
Blood pressure within goal. -Losartan was restarted and we will stop amlodipine for discharge.

## 2021-08-12 NOTE — Progress Notes (Signed)
Progress Note   Patient: Ruben Green GUY:[id number] DOB: [birth year] DOA: 08/08/2021     4 DOS: the patient was seen and examined on 08/12/2021   Brief hospital course: Taken from H&P.  Ruben Green is a 88 y.o. male with medical history significant of CAD, COPD, HTN, prostate cancer, AAA, who is s/p right hip replacement on 5/63, complicated by postoperative hypoxia, ruled out for PE with CTA chest, also with finding of postoperative anemia of 7.8, which remained stable for which she did not receive transfusions, and discharged in stable condition, and who is currently on Eliquis for DVT prophylaxis postop, who presents to the ED with a complaint of lightheadedness, sore throat and cough and cold symptoms.  He denies fever or chills, chest pain, vomiting or diarrhea or abdominal pain.  EMS reports a blood pressure of 60/40 and O2 sat 88% on room air requiring 3 L with improvement to 97%.   On arrival to ED his blood pressure was 102/57, responded to IV fluid.  He was found to have leukocytosis, lactic acidosis at 2.5 which has been now normalized.  Mildly positive troponin with downward trend.  Hemoglobin of 7.4, D-dimer of 2.71, mild AKI with creatinine of 1.35, sodium of 129, I UA with large leukocyte esterase and few bacteria, COVID and flu negative.  CTA was negative for PE but did show trace bilateral pleural effusions right greater than left, associated passive atelectasis of bilateral lower lobes with some concern of superimposed infection/inflammation. There was an hiatal hernia with associated air-fluid level within the mid esophagus and emphysema, there was concern of aspiration pneumonia so he was started on cefepime and Zithromax in ED which was later transitioned to Zosyn.  In ED he transiently required BiPAP due to worsening shortness of breath.  Currently saturating well on 5 to 6 L of oxygen.  2/23: Oxygen requirement improved to 2 L.  Urine cultures growing more than  100,000 colonies of gram-negative rods.  2/25; urine culture grew Serratia marcescens , he was discharged home yesterday on appropriate antibiotics.  Appealed the discharge as he was still feeling weak. Clinically remained stable and no new complaints today.   Assessment and Plan: * Severe sepsis ([place]) Sepsis criteria includes leukocytosis, lactic acidosis, tachypnea, hypoxia, AKI with respiratory failure, hypotension Secondary to pneumonia and likely UTI-urine cultures with Serratia marcescens this blood cultures remain negative. Patient also had a Foley catheter in place since prior admission when he developed urinary retention after the surgery. Received fluid per sepsis protocol. Initially received cefepime and Zithromax and later transitioned to Zosyn for concern of aspiration pneumonia. We will give him a unsuccessful voiding trial, Foley catheter was replaced due to urinary retention. -Antibiotics were de-escalated to Levaquin based on susceptibility results, to complete a 7-day course due to complicated UTI  Acute respiratory failure with hypoxia ([name]) Tachypnea with O2 sat 86% on room air.  Uses 2 to 3 L at baseline since prior hospitalization. Initially requiring 3 L of oxygen, he was placed on BiPAP transiently in ED due to worsening respiratory status.  There was some concern of CHF due to elevated BNP at 259 which is not very conclusive. Echocardiogram with normal EF and grade 1 diastolic dysfunction. CTA with concern of emphysema although no chronic oxygen requirement.  Apparently developed hypoxia after hip replacement surgery and discharged home on 3 L of oxygen. -Continue supplemental oxygen-wean as tolerated.  Bilateral pneumonia Some concern for aspiration pneumonia given fluid-filled esophagus seen on CT, he  was started on Zosyn. -Check procalcitonin-0.23 -Zosyn was switched with Levaquin -Incentive spirometer -Antitussives -Supplemental oxygen -DuoNebs as  needed -Maintain aspiration precautions   UTI (urinary tract infection) Urine cultures with Serratia marcescens  -Switch Zosyn with Levaquin to complete a 7-day course  Hyponatremia- (present on admission) Resolved.  Possibly related to poor oral intake from acute illness -Continue to monitor  Status post right hip replacement 08/01/21 No acute issues suspected Surgical site appears well-healing.  Essential (primary) hypertension- (present on admission) Blood pressure within goal. -Losartan was restarted and we will stop amlodipine for discharge.  Chronic obstructive pulmonary disease (HCC)- (present on admission) Not acutely exacerbated DuoNebs as needed  AKI (acute kidney injury) ([place]) Secondary to sepsis Creatinine started improving. Baseline around 1 -Monitor renal function -Avoid nephrotoxins  Coronary artery disease involving native coronary artery of native heart without angina pectoris- (present on admission) No chest pain.  Mildly elevated troponin with a downward trend, most likely secondary to demand. -Continue home dose of statin  Elevated troponin- (present on admission) Troponin of 48, suspect related to demand Downward trend. Echocardiogram to evaluate for wall motion abnormality Patient is denying chest pain and EKG is nonacute  On continuous oral anticoagulation Since 2/14 for postoperative DVT prophylaxis We will give subcu Lovenox prophylaxis instead  Bilateral pleural effusion Trace bilateral pleural effusions No history of CHF Follow BNP Monitor for fluid overload in view of sepsis fluids We will get echocardiogram if not recently done  Anemia Hemoglobin trending down from recent hospitalization Continue to monitor CBC Latest Ref Rng & Units 08/10/2021 08/09/2021 08/08/2021  WBC 4.0 - 10.5 K/uL 9.5 12.6(H) 13.8(H)  Hemoglobin 13.0 - 17.0 g/dL 7.2(L) 7.5(L) 7.4(L)  Hematocrit 39.0 - 52.0 % 24.0(L) 24.7(L) 24.7(L)  Platelets 150 - 400 K/uL 305  290 325   -Continue home iron and B12 supplement -Give her 1 unit of packed RBC due to hemoglobin trending down.  Prostate cancer [facility])- (present on admission) No acute issues  Abdominal aortic aneurysm (AAA) without rupture- (present on admission) No acute issues suspected at this time   Pressure Injury 08/04/21 Buttocks Right Stage 2 -  Partial thickness loss of dermis presenting as a shallow open injury with a red, pink wound bed without slough. small skin tears (Active)  08/04/21 0900  Location: Buttocks  Location Orientation: Right  Staging: Stage 2 -  Partial thickness loss of dermis presenting as a shallow open injury with a red, pink wound bed without slough.  Wound Description (Comments): small skin tears  Present on Admission:     Subjective: Patient was seen and examined today.  Continues to have some cough.  Wife at bedside.  She had a lot of questions regarding prior home medications which were answered to the best of my ability.  Patient and wife wants to leave today.  Physical Exam: Vitals:   08/12/21 0000 08/12/21 0400 08/12/21 0744 08/12/21 1140  BP: 130/75 (!) 164/83 139/89 114/67  Pulse: 90 78 97 96  Resp: 16 16 18 18   Temp: 97.8 F (36.6 C) 97.7 F (36.5 C) 98.3 F (36.8 C) 98 F (36.7 C)  TempSrc: Oral Oral Oral   SpO2: 90% 96% 92% 93%  Weight:      Height:       General.  Frail elderly man, in no acute distress.  Hard of hearing pulmonary.  Lungs clear bilaterally, normal respiratory effort. CV.  Regular rate and rhythm, no JVD, rub or murmur. Abdomen.  Soft, nontender, nondistended, BS  positive. CNS.  Alert and oriented .  No focal neurologic deficit. Extremities.  No edema, no cyanosis, pulses intact and symmetrical. Psychiatry.  Judgment and insight appears normal.  Data Reviewed: Prior data which include notes and labs were reviewed  Family Communication: Discussed with wife at bedside  Disposition: Status is: Inpatient Remains inpatient  appropriate because: Patient was discharged yesterday.  They appealed the discharge and today would like to go back home.  Appeal results still pending.   Planned Discharge Destination: Home with Home Health  DVT prophylaxis.  Eliquis  Time spent: 40 minutes  This record has been created using Systems analyst. Errors have been sought and corrected,but may not always be located. Such creation errors do not reflect on the standard of care.  Author: Lorella Nimrod, MD 08/12/2021 12:51 PM  For on call review [email or web address].

## 2021-08-12 NOTE — Progress Notes (Signed)
Physical Therapy Treatment Patient Details Name: Ruben Green MRN: [id number] DOB: May 12, 1934 Today's Date: 08/12/2021   History of Present Illness Pt. is an 88 y.o. male s/p Posterior R THA on 2/14. Recently admitted due to c/o lightheadedness (2/21), Severe Sepsis, acute respiratory failure w/ hypoxia, bilateral pneumonia. EMS reported a BP of 60/40 and ED reported BP of 102/57. PmHx: CAD, COPD, HTN, Prostate Cancer, AAA    PT Comments    Pt was in bed with supportive spouse at bedside. Spouse states," I think we are going home today." Pt is A and oriented but continues to be slightly impulsive at times. Overall pt demonstrates safe abilities to exit bed, stand, and ambulate with RW. He requires 2 L O2 to maintain sao2>88%. Overall tolerated ambulation 200 ft with RW + supervision. 2 standing rest to control slight SOB. Urine output in foley is pinkish. Much less dark than observed by author previous date.  Overall pt is cleared from an acute PT standpoint for safe DC home with HHPT to follow. He will benefit from HHPT to continue to progress pt to maximal independence with all ADLs.    Recommendations for follow up therapy are one component of a multi-disciplinary discharge planning process, led by the attending physician.  Recommendations may be updated based on patient status, additional functional criteria and insurance authorization.  Follow Up Recommendations  Home health PT     Assistance Recommended at Discharge Frequent or constant Supervision/Assistance  Patient can return home with the following A little help with walking and/or transfers;A little help with bathing/dressing/bathroom;Assist for transportation;Help with stairs or ramp for entrance;Assistance with cooking/housework   Equipment Recommendations  None recommended by PT       Precautions / Restrictions Precautions Precautions: Posterior Hip;Fall Precaution Booklet Issued: Yes (comment) Restrictions Weight  Bearing Restrictions: Yes RLE Weight Bearing: Weight bearing as tolerated     Mobility  Bed Mobility Overal bed mobility: Needs Assistance Bed Mobility: Supine to Sit     Supine to sit: Min assist     General bed mobility comments: min assist to progress RLE to EOB.    Transfers Overall transfer level: Needs assistance Equipment used: Rolling walker (2 wheels) Transfers: Sit to/from Stand Sit to Stand: Supervision           General transfer comment: no physical assistance or vcs for STS    Ambulation/Gait Ambulation/Gait assistance: Supervision Gait Distance (Feet): 200 Feet Assistive device: Rolling walker (2 wheels) Gait Pattern/deviations: Step-through pattern, Decreased step length - right, Decreased step length - left, Decreased stride length Gait velocity: decreased     General Gait Details: pt was able to safely ambulate 200 ft. does require several vcs to slow down however overall was safe. on 2 L o2 throughout session with sao2 > 90%. 2 standing rest breaks for controlling slight SOB    Balance Overall balance assessment: Needs assistance Sitting-balance support: Feet supported Sitting balance-Leahy Scale: Good     Standing balance support: Bilateral upper extremity supported, During functional activity, Reliant on assistive device for balance Standing balance-Leahy Scale: Good      Cognition Arousal/Alertness: Awake/alert Behavior During Therapy: WFL for tasks assessed/performed Overall Cognitive Status: Within Functional Limits for tasks assessed      General Comments: Pt is A and O but still slightly impulsive at times. Will have 24/7 supervision at DC               Pertinent Vitals/Pain Pain Assessment Pain Assessment: 0-10 Pain Score: 4  Faces Pain Scale: Hurts a little bit Pain Location: Right Hip Pain Descriptors / Indicators: Grimacing, Aching, Discomfort, Sore Pain Intervention(s): Limited activity within patient's tolerance,  Monitored during session, Repositioned     PT Goals (current goals can now be found in the care plan section) Acute Rehab PT Goals Patient Stated Goal: to go home Progress towards PT goals: Progressing toward goals    Frequency    7X/week      PT Plan Current plan remains appropriate       AM-PAC PT "6 Clicks" Mobility   Outcome Measure  Help needed turning from your back to your side while in a flat bed without using bedrails?: A Little Help needed moving from lying on your back to sitting on the side of a flat bed without using bedrails?: A Little Help needed moving to and from a bed to a chair (including a wheelchair)?: A Little Help needed standing up from a chair using your arms (e.g., wheelchair or bedside chair)?: A Little Help needed to walk in hospital room?: A Little Help needed climbing 3-5 steps with a railing? : A Little 6 Click Score: 18    End of Session Equipment Utilized During Treatment: Oxygen (2 L) Activity Tolerance: Patient tolerated treatment well Patient left: in chair;with chair alarm set;with call bell/phone within reach;with family/visitor present Nurse Communication: Mobility status PT Visit Diagnosis: Muscle weakness (generalized) (M62.81);Difficulty in walking, not elsewhere classified (R26.2);Pain Pain - Right/Left: Right Pain - part of body: Hip     Time: 0721-0744 PT Time Calculation (min) (ACUTE ONLY): 23 min  Charges:  $Gait Training: 8-22 mins $Therapeutic Activity: 8-22 mins                     Julaine Fusi PTA 08/12/21, 7:53 AM

## 2021-08-12 NOTE — Assessment & Plan Note (Signed)
Sepsis criteria includes leukocytosis, lactic acidosis, tachypnea, hypoxia, AKI with respiratory failure, hypotension Secondary to pneumonia and likely UTI-urine cultures with Serratia marcescens this blood cultures remain negative. Patient also had a Foley catheter in place since prior admission when he developed urinary retention after the surgery. Received fluid per sepsis protocol. Initially received cefepime and Zithromax and later transitioned to Zosyn for concern of aspiration pneumonia. We will give him a unsuccessful voiding trial, Foley catheter was replaced due to urinary retention. -Antibiotics were de-escalated to Levaquin based on susceptibility results, to complete a 7-day course due to complicated UTI

## 2021-08-12 NOTE — Progress Notes (Signed)
Son arrived with O2, patient taken down to DC area via wheelchair by tech.  Son and wife with patient.

## 2021-08-13 LAB — CULTURE, BLOOD (SINGLE): Culture: NO GROWTH

## 2021-08-14 DIAGNOSIS — H44001 Unspecified purulent endophthalmitis, right eye: Secondary | ICD-10-CM | POA: Diagnosis not present

## 2021-08-14 DIAGNOSIS — C61 Malignant neoplasm of prostate: Secondary | ICD-10-CM | POA: Diagnosis not present

## 2021-08-14 DIAGNOSIS — D090 Carcinoma in situ of bladder: Secondary | ICD-10-CM | POA: Diagnosis not present

## 2021-08-14 DIAGNOSIS — Z96641 Presence of right artificial hip joint: Secondary | ICD-10-CM | POA: Diagnosis not present

## 2021-08-14 DIAGNOSIS — J449 Chronic obstructive pulmonary disease, unspecified: Secondary | ICD-10-CM | POA: Diagnosis not present

## 2021-08-15 ENCOUNTER — Other Ambulatory Visit: Payer: Self-pay

## 2021-08-15 ENCOUNTER — Ambulatory Visit (INDEPENDENT_AMBULATORY_CARE_PROVIDER_SITE_OTHER): Payer: PPO | Admitting: Urology

## 2021-08-15 DIAGNOSIS — C61 Malignant neoplasm of prostate: Secondary | ICD-10-CM | POA: Diagnosis not present

## 2021-08-15 DIAGNOSIS — R310 Gross hematuria: Secondary | ICD-10-CM

## 2021-08-15 DIAGNOSIS — D090 Carcinoma in situ of bladder: Secondary | ICD-10-CM | POA: Diagnosis not present

## 2021-08-16 DIAGNOSIS — Z978 Presence of other specified devices: Secondary | ICD-10-CM | POA: Diagnosis not present

## 2021-08-16 DIAGNOSIS — M1711 Unilateral primary osteoarthritis, right knee: Secondary | ICD-10-CM | POA: Diagnosis not present

## 2021-08-16 DIAGNOSIS — J42 Unspecified chronic bronchitis: Secondary | ICD-10-CM | POA: Diagnosis not present

## 2021-08-16 DIAGNOSIS — M1611 Unilateral primary osteoarthritis, right hip: Secondary | ICD-10-CM | POA: Diagnosis not present

## 2021-08-16 DIAGNOSIS — Z96641 Presence of right artificial hip joint: Secondary | ICD-10-CM | POA: Diagnosis not present

## 2021-08-16 NOTE — Progress Notes (Signed)
08/15/2021 8:47 PM   Ruben Green September 01, 1933 [id number]  Referring provider: Tracie Harrier, MD [address] [facility] [place],  [place] [address]  Chief Complaint  Patient presents with   Hematuria   Urological history 1. Prostate cancer -PSA <0.01 in 08/2020 -T1c adenocarcinoma the prostate, high risk -IMRT 2019 -ADT 2021   2. Bladder cancer -Carcinoma in situ bladder biopsies 02/23/2020 -Completed 6 week course of BCG induction 06/13/2020 -cysto 03/2021 - NED -urine cytology-negative  HPI: Ruben Green is a 88 y.o. male who presents today for gross hematuria and passing clots with his wife, Ruben Green, and son, Iona Beard.    His home health nurse contacted the office this morning stating that he was having gross hematuria with large clots.  Upon presentation to the office, the urine was light pink in the overnight bag and a clot the size of a "Ruben Green and Ruben Green."    His Foley catheter has been in place since his admission on February 14 for right hip replacement.  They had scheduled him with Dr. Maryan Puls for a trial of void, the patient's wife called the office and canceled and rescheduled with [place] as we are his primary urologist.  He has no other urinary complaints at this time other than he would like to have the catheter removed for a trial of void.  PMH: Past Medical History:  Diagnosis Date   AAA (abdominal aortic aneurysm)    a.) s/p repair in 2005   Anemia    Anginal pain ([place])    Anxiety    Arthritis    B12 deficiency    Bilateral cataracts    a.) s/p BILATERAL extractions in 2018   BPH with obstruction/lower urinary tract symptoms    CAD (coronary artery disease)    Carotid artery stenosis    a.) s/p CEA on the RIGHT   COPD (chronic obstructive pulmonary disease) (HCC)    Diastolic dysfunction 46/96/2952   a.)  TTE 07/29/2019: EF 50-55%; LA mildly enlarged; G1DD.   DOE (dyspnea on exertion)    Elevated PSA     Environmental and seasonal allergies    History of 2019 novel coronavirus disease (COVID-19)    History of kidney stones    HLD (hyperlipidemia)    HOH (hard of hearing)    HTN (hypertension)    Incomplete bladder emptying    Macular degeneration    Prostate cancer (HCC)    RBBB (right bundle branch block)    Valvular insufficiency    a.) TTE 07/29/2019: LVEF 50-55%; LA mild enlarged; triv AR/PR, mild MR, mod TR.    Surgical History: Past Surgical History:  Procedure Laterality Date   ABDOMINAL AORTIC ANEURYSM REPAIR     CATARACT EXTRACTION W/PHACO Right 09/25/2016   Procedure: CATARACT EXTRACTION PHACO AND INTRAOCULAR LENS PLACEMENT (IOC);  Surgeon: Birder Robson, MD;  Location: [facility] ORS;  Service: Ophthalmology;  Laterality: Right;  [place] 01:01 AP% 17.4 CDE 10.75 Fluid pack lot # [id number]   CATARACT EXTRACTION W/PHACO Left 10/16/2016   Procedure: CATARACT EXTRACTION PHACO AND INTRAOCULAR LENS PLACEMENT ([place]) Suture placed in Left eye;  Surgeon: Birder Robson, MD;  Location: [facility] ORS;  Service: Ophthalmology;  Laterality: Left;  [place] 2:06.8 AP% 22.2 CDE 28.17 Fluid pack lot # [id number]   COLONOSCOPY WITH PROPOFOL N/A 03/31/2019   Procedure: COLONOSCOPY WITH PROPOFOL;  Surgeon: Lollie Sails, MD;  Location: [facility] ENDOSCOPY;  Service: Endoscopy;  Laterality: N/A;   COLONOSCOPY WITH PROPOFOL N/A 09/09/2020  Procedure: COLONOSCOPY WITH PROPOFOL;  Surgeon: Lesly Rubenstein, MD;  Location: [facility];  Service: Endoscopy;  Laterality: N/A;   CYSTOSCOPY W/ RETROGRADES Bilateral 02/23/2020   Procedure: CYSTOSCOPY WITH RETROGRADE PYELOGRAM;  Surgeon: Abbie Sons, MD;  Location: [facility] ORS;  Service: Urology;  Laterality: Bilateral;   CYSTOSCOPY WITH BIOPSY N/A 02/23/2020   Procedure: CYSTOSCOPY WITH BIOPSY;  Surgeon: Abbie Sons, MD;  Location: [facility] ORS;  Service: Urology;  Laterality: N/A;   EYE SURGERY     HERNIA REPAIR     x 2   INGUINAL HERNIA REPAIR Right 12/02/2015    Procedure: HERNIA REPAIR INGUINAL ADULT;  Surgeon: Leonie Green, MD;  Location: [facility] ORS;  Service: General;  Laterality: Right;   PROSTATE SURGERY  2012   Right Carotid Endarterectomy     TOTAL HIP ARTHROPLASTY Right 08/01/2021   Procedure: TOTAL HIP ARTHROPLASTY;  Surgeon: Corky Mull, MD;  Location: [facility] ORS;  Service: Orthopedics;  Laterality: Right;    Home Medications:  Allergies as of 08/15/2021       Reactions   Oxycodone Other (See Comments)   Caused hallucinations/does not want anymore        Medication List        Accurate as of August 15, 2021 11:59 PM. If you have any questions, ask your nurse or doctor.          acetaminophen 500 MG tablet Commonly known as: TYLENOL Take 1-2 tablets (500-1,000 mg total) by mouth every 6 (six) hours as needed for mild pain.   albuterol 108 (90 Base) MCG/ACT inhaler Commonly known as: VENTOLIN HFA Inhale 2 puffs into the lungs every 6 (six) hours as needed for wheezing or shortness of breath.   apixaban 2.5 MG Tabs tablet Commonly known as: ELIQUIS Take 1 tablet (2.5 mg total) by mouth 2 (two) times daily.   atorvastatin 40 MG tablet Commonly known as: LIPITOR Take 40 mg by mouth daily.   finasteride 5 MG tablet Commonly known as: Proscar Take 1 tablet (5 mg total) by mouth daily.   gabapentin 100 MG capsule Commonly known as: NEURONTIN Take 100 mg by mouth at bedtime as needed (pain).   iron polysaccharides 150 MG capsule Commonly known as: NIFEREX Take 150 mg by mouth daily.   levofloxacin 250 MG tablet Commonly known as: LEVAQUIN Take 1 tablet (250 mg total) by mouth daily for 5 days.   LORazepam 0.5 MG tablet Commonly known as: ATIVAN Take 0.5 mg by mouth every 8 (eight) hours as needed for anxiety.   losartan 100 MG tablet Commonly known as: COZAAR Take 100 mg by mouth daily.   ondansetron 4 MG tablet Commonly known as: ZOFRAN Take 1 tablet (4 mg total) by mouth every 6 (six) hours as  needed for nausea.   tamsulosin 0.4 MG Caps capsule Commonly known as: FLOMAX Take 1 capsule (0.4 mg total) by mouth daily.   tiZANidine 4 MG tablet Commonly known as: Zanaflex Take 1 tablet (4 mg total) by mouth every 8 (eight) hours as needed for muscle spasms.   Trelegy Ellipta 100-62.5-25 MCG/ACT Aepb Generic drug: Fluticasone-Umeclidin-Vilant Inhale 2 puffs into the lungs daily.   vitamin B-12 1000 MCG tablet Commonly known as: CYANOCOBALAMIN Take 1,000 mcg by mouth daily.        Allergies:  Allergies  Allergen Reactions   Oxycodone Other (See Comments)    Caused hallucinations/does not want anymore    Family History: Family History  Problem Relation Age of  Onset   Cancer Brother        2000   Prostate cancer Neg Hx    Bladder Cancer Neg Hx    Kidney cancer Neg Hx     Social History:  reports that he quit smoking about 22 years ago. His smoking use included cigarettes. He has quit using smokeless tobacco. He reports that he does not currently use alcohol. He reports that he does not use drugs.  ROS: Pertinent ROS in HPI  Physical Exam: Constitutional:  Well nourished. Alert and oriented, No acute distress. HEENT: [place] AT, mask in place.  Trachea midline Cardiovascular: No clubbing, cyanosis, or edema. Respiratory: Normal respiratory effort, no increased work of breathing. GU: No CVA tenderness.  No bladder fullness or masses.  Foley catheter in place draining very light pink urine. Neurologic: Grossly intact, no focal deficits, moving all 4 extremities. Psychiatric: Normal mood and affect.   Laboratory Data: BMP Latest Ref Rng & Units 08/12/2021 08/11/2021 08/10/2021  Glucose 70 - 99 mg/dL 177(H) 103(H) 107(H)  BUN 8 - 23 mg/dL 17 19 28(H)  Creatinine 0.61 - 1.24 mg/dL 1.35(H) 1.31(H) 1.41(H)  Sodium 135 - 145 mmol/L 136 136 137  Potassium 3.5 - 5.1 mmol/L 4.7 4.4 4.5  Chloride 98 - 111 mmol/L 103 107 107  CO2 22 - 32 mmol/L 24 24 24   Calcium 8.9 - 10.3  mg/dL 8.6(L) 8.0(L) 7.9(L)    CBC Latest Ref Rng & Units 08/11/2021 08/10/2021 08/10/2021  WBC 4.0 - 10.5 K/uL 7.9 - 9.5  Hemoglobin 13.0 - 17.0 g/dL 7.8(L) 8.0(L) 7.2(L)  Hematocrit 39.0 - 52.0 % 25.3(L) 25.6(L) 24.0(L)  Platelets 150 - 400 K/uL 325 - 305  I have reviewed the labs.   Pertinent Imaging: N/A  Assessment & Plan:    1. Gross hematuria -Likely an incidental clot seen in catheter but it has passed since the catheter is draining well with light pink urine -Patient currently has a 14 French Foley catheter in place and it would be very difficult to irrigate the bladder further with such a small catheter and patient would rather return in 2 days to have the catheter removed for trial of void rather than switching the catheter out today for a larger lumen and irrigate for any possible further clot  2. Prostate cancer -will need a PSA once he has passed his voiding trial   3. Bladder cancer -will need a surveillance cysto once he has passed his voiding trial   Return for Thursday for TOV and PVR .  These notes generated with voice recognition software. I apologize for typographical errors.  Zara Council, PA-C  [facility] [address]  [place],  [address] [phone]

## 2021-08-17 ENCOUNTER — Ambulatory Visit (INDEPENDENT_AMBULATORY_CARE_PROVIDER_SITE_OTHER): Payer: PPO | Admitting: Urology

## 2021-08-17 ENCOUNTER — Other Ambulatory Visit: Payer: Self-pay

## 2021-08-17 ENCOUNTER — Ambulatory Visit: Payer: PPO | Admitting: Urology

## 2021-08-17 ENCOUNTER — Encounter: Payer: Self-pay | Admitting: Urology

## 2021-08-17 DIAGNOSIS — D090 Carcinoma in situ of bladder: Secondary | ICD-10-CM | POA: Diagnosis not present

## 2021-08-17 DIAGNOSIS — R310 Gross hematuria: Secondary | ICD-10-CM

## 2021-08-17 DIAGNOSIS — C61 Malignant neoplasm of prostate: Secondary | ICD-10-CM | POA: Diagnosis not present

## 2021-08-17 LAB — BLADDER SCAN AMB NON-IMAGING

## 2021-08-17 NOTE — Progress Notes (Signed)
08/17/2021 2:11 PM   Ruben Green 25-Feb-1934 [id number]  Referring provider: Tracie Harrier, MD [address] [facility] [place],  [place] [address]  Chief Complaint  Patient presents with   Other   Urological history 1. Prostate cancer -PSA <0.01 in 08/2020 -T1c adenocarcinoma the prostate, high risk -IMRT 2019 -ADT 2021   2. Bladder cancer -Carcinoma in situ bladder biopsies 02/23/2020 -Completed 6 week course of BCG induction 06/13/2020 -cysto 03/2021 - NED -urine cytology-negative  HPI: Ruben Green is a 88 y.o. male who presents today for Foley catheter removal for a trial of void his wife, Evdoxia  Catheter Removal Patient is present today for a catheter removal.  9 ml of water was drained from the balloon. A 14 FR Coude foley cath was removed from the bladder no complications were noted . Patient tolerated well.  Over night bag with yellow urine.   He returns this afternoon and his wife states that he feels 1-1/2 urinals and voided a good amount here in the office.  His PVR is 256 mL in reviewing previous PVRs, it seems his baseline is 150 to 250 cc.  He feels comfortable and does not have any suprapubic pain.  Patient denies any modifying or aggravating factors.  Patient denies any gross hematuria, dysuria or suprapubic/flank pain.  Patient denies any fevers, chills, nausea or vomiting.    PMH: Past Medical History:  Diagnosis Date   AAA (abdominal aortic aneurysm)    a.) s/p repair in 2005   Anemia    Anginal pain ([place])    Anxiety    Arthritis    B12 deficiency    Bilateral cataracts    a.) s/p BILATERAL extractions in 2018   BPH with obstruction/lower urinary tract symptoms    CAD (coronary artery disease)    Carotid artery stenosis    a.) s/p CEA on the RIGHT   COPD (chronic obstructive pulmonary disease) (HCC)    Diastolic dysfunction 26/83/4196   a.)  TTE 07/29/2019: EF 50-55%; LA mildly enlarged; G1DD.   DOE  (dyspnea on exertion)    Elevated PSA    Environmental and seasonal allergies    History of 2019 novel coronavirus disease (COVID-19)    History of kidney stones    HLD (hyperlipidemia)    HOH (hard of hearing)    HTN (hypertension)    Incomplete bladder emptying    Macular degeneration    Prostate cancer (HCC)    RBBB (right bundle branch block)    Valvular insufficiency    a.) TTE 07/29/2019: LVEF 50-55%; LA mild enlarged; triv AR/PR, mild MR, mod TR.    Surgical History: Past Surgical History:  Procedure Laterality Date   ABDOMINAL AORTIC ANEURYSM REPAIR     CATARACT EXTRACTION W/PHACO Right 09/25/2016   Procedure: CATARACT EXTRACTION PHACO AND INTRAOCULAR LENS PLACEMENT (IOC);  Surgeon: Birder Robson, MD;  Location: [facility] ORS;  Service: Ophthalmology;  Laterality: Right;  [place] 01:01 AP% 17.4 CDE 10.75 Fluid pack lot # [id number]   CATARACT EXTRACTION W/PHACO Left 10/16/2016   Procedure: CATARACT EXTRACTION PHACO AND INTRAOCULAR LENS PLACEMENT ([place]) Suture placed in Left eye;  Surgeon: Birder Robson, MD;  Location: [facility] ORS;  Service: Ophthalmology;  Laterality: Left;  [place] 2:06.8 AP% 22.2 CDE 28.17 Fluid pack lot # [id number]   COLONOSCOPY WITH PROPOFOL N/A 03/31/2019   Procedure: COLONOSCOPY WITH PROPOFOL;  Surgeon: Lollie Sails, MD;  [facility];  Service: Endoscopy;  Laterality: N/A;  COLONOSCOPY WITH PROPOFOL N/A 09/09/2020   Procedure: COLONOSCOPY WITH PROPOFOL;  Surgeon: Lesly Rubenstein, MD;  Location: [facility] ENDOSCOPY;  Service: Endoscopy;  Laterality: N/A;   CYSTOSCOPY W/ RETROGRADES Bilateral 02/23/2020   Procedure: CYSTOSCOPY WITH RETROGRADE PYELOGRAM;  Surgeon: Abbie Sons, MD;  Location: [facility] ORS;  Service: Urology;  Laterality: Bilateral;   CYSTOSCOPY WITH BIOPSY N/A 02/23/2020   Procedure: CYSTOSCOPY WITH BIOPSY;  Surgeon: Abbie Sons, MD;  Location: [facility] ORS;  Service: Urology;  Laterality: N/A;   EYE SURGERY     HERNIA REPAIR     x 2    INGUINAL HERNIA REPAIR Right 12/02/2015   Procedure: HERNIA REPAIR INGUINAL ADULT;  Surgeon: Leonie Green, MD;  Location: [facility] ORS;  Service: General;  Laterality: Right;   PROSTATE SURGERY  2012   Right Carotid Endarterectomy     TOTAL HIP ARTHROPLASTY Right 08/01/2021   Procedure: TOTAL HIP ARTHROPLASTY;  Surgeon: Corky Mull, MD;  Location: [facility] ORS;  Service: Orthopedics;  Laterality: Right;    Home Medications:  Allergies as of 08/17/2021       Reactions   Oxycodone Other (See Comments)   Caused hallucinations/does not want anymore        Medication List        Accurate as of August 17, 2021  2:11 PM. If you have any questions, ask your nurse or doctor.          acetaminophen 500 MG tablet Commonly known as: TYLENOL Take 1-2 tablets (500-1,000 mg total) by mouth every 6 (six) hours as needed for mild pain.   albuterol 108 (90 Base) MCG/ACT inhaler Commonly known as: VENTOLIN HFA Inhale 2 puffs into the lungs every 6 (six) hours as needed for wheezing or shortness of breath.   apixaban 2.5 MG Tabs tablet Commonly known as: ELIQUIS Take 1 tablet (2.5 mg total) by mouth 2 (two) times daily.   atorvastatin 40 MG tablet Commonly known as: LIPITOR Take 40 mg by mouth daily.   finasteride 5 MG tablet Commonly known as: Proscar Take 1 tablet (5 mg total) by mouth daily.   gabapentin 100 MG capsule Commonly known as: NEURONTIN Take 100 mg by mouth at bedtime as needed (pain).   iron polysaccharides 150 MG capsule Commonly known as: NIFEREX Take 150 mg by mouth daily.   levofloxacin 250 MG tablet Commonly known as: LEVAQUIN Take 1 tablet (250 mg total) by mouth daily for 5 days.   LORazepam 0.5 MG tablet Commonly known as: ATIVAN Take 0.5 mg by mouth every 8 (eight) hours as needed for anxiety.   losartan 100 MG tablet Commonly known as: COZAAR Take 100 mg by mouth daily.   ondansetron 4 MG tablet Commonly known as: ZOFRAN Take 1 tablet (4 mg total)  by mouth every 6 (six) hours as needed for nausea.   tamsulosin 0.4 MG Caps capsule Commonly known as: FLOMAX Take 1 capsule (0.4 mg total) by mouth daily.   tiZANidine 4 MG tablet Commonly known as: Zanaflex Take 1 tablet (4 mg total) by mouth every 8 (eight) hours as needed for muscle spasms.   Trelegy Ellipta 100-62.5-25 MCG/ACT Aepb Generic drug: Fluticasone-Umeclidin-Vilant Inhale 2 puffs into the lungs daily.   vitamin B-12 1000 MCG tablet Commonly known as: CYANOCOBALAMIN Take 1,000 mcg by mouth daily.        Allergies:  Allergies  Allergen Reactions   Oxycodone Other (See Comments)    Caused hallucinations/does not want anymore    Family  History: Family History  Problem Relation Age of Onset   Cancer Brother        2000   Prostate cancer Neg Hx    Bladder Cancer Neg Hx    Kidney cancer Neg Hx     Social History:  reports that he quit smoking about 22 years ago. His smoking use included cigarettes. He has quit using smokeless tobacco. He reports that he does not currently use alcohol. He reports that he does not use drugs.  ROS: Pertinent ROS in HPI  Physical Exam: Constitutional:  Well nourished. Alert and oriented, No acute distress. HEENT: [place] AT, mask in place.  Trachea midline Cardiovascular: No clubbing, cyanosis, or edema. Respiratory: Normal respiratory effort, no increased work of breathing. GU: No CVA tenderness.  No bladder fullness or masses.  Foley catheter in place draining very light pink urine. Neurologic: Grossly intact, no focal deficits, moving all 4 extremities. Psychiatric: Normal mood and affect.   Laboratory Data: BMP Latest Ref Rng & Units 08/12/2021 08/11/2021 08/10/2021  Glucose 70 - 99 mg/dL 177(H) 103(H) 107(H)  BUN 8 - 23 mg/dL 17 19 28(H)  Creatinine 0.61 - 1.24 mg/dL 1.35(H) 1.31(H) 1.41(H)  Sodium 135 - 145 mmol/L 136 136 137  Potassium 3.5 - 5.1 mmol/L 4.7 4.4 4.5  Chloride 98 - 111 mmol/L 103 107 107  CO2 22 - 32 mmol/L  24 24 24   Calcium 8.9 - 10.3 mg/dL 8.6(L) 8.0(L) 7.9(L)    CBC Latest Ref Rng & Units 08/11/2021 08/10/2021 08/10/2021  WBC 4.0 - 10.5 K/uL 7.9 - 9.5  Hemoglobin 13.0 - 17.0 g/dL 7.8(L) 8.0(L) 7.2(L)  Hematocrit 39.0 - 52.0 % 25.3(L) 25.6(L) 24.0(L)  Platelets 150 - 400 K/uL 325 - 305  I have reviewed the labs.   Pertinent Imaging:  08/17/21 14:05  Scan Result 232mL    Assessment & Plan:    1. Urinary retention -Foley removed this morning and he is voiding well -Secondary to hospitalizations  2. Gross hematuria -resolved  3. Prostate cancer -will need a PSA once he has passed his voiding trial   4. Bladder cancer -will need a surveillance cysto once he has passed his voiding trial   Return in about 1 month (around 09/17/2021) for cysto/PSA with Dr. Bernardo Heater.  These notes generated with voice recognition software. I apologize for typographical errors.  Zara Council, PA-C  [facility] [address]  [place], [place] [address] [phone]

## 2021-08-18 DIAGNOSIS — I2510 Atherosclerotic heart disease of native coronary artery without angina pectoris: Secondary | ICD-10-CM | POA: Diagnosis not present

## 2021-08-18 DIAGNOSIS — I10 Essential (primary) hypertension: Secondary | ICD-10-CM | POA: Diagnosis not present

## 2021-08-18 DIAGNOSIS — J441 Chronic obstructive pulmonary disease with (acute) exacerbation: Secondary | ICD-10-CM | POA: Diagnosis not present

## 2021-08-18 DIAGNOSIS — Z9981 Dependence on supplemental oxygen: Secondary | ICD-10-CM | POA: Diagnosis not present

## 2021-08-18 DIAGNOSIS — Z8546 Personal history of malignant neoplasm of prostate: Secondary | ICD-10-CM | POA: Diagnosis not present

## 2021-08-18 DIAGNOSIS — J9811 Atelectasis: Secondary | ICD-10-CM | POA: Diagnosis not present

## 2021-08-18 DIAGNOSIS — Z7901 Long term (current) use of anticoagulants: Secondary | ICD-10-CM | POA: Diagnosis not present

## 2021-08-18 DIAGNOSIS — Z471 Aftercare following joint replacement surgery: Secondary | ICD-10-CM | POA: Diagnosis not present

## 2021-08-18 DIAGNOSIS — Z96641 Presence of right artificial hip joint: Secondary | ICD-10-CM | POA: Diagnosis not present

## 2021-08-18 DIAGNOSIS — N400 Enlarged prostate without lower urinary tract symptoms: Secondary | ICD-10-CM | POA: Diagnosis not present

## 2021-08-24 DIAGNOSIS — J189 Pneumonia, unspecified organism: Secondary | ICD-10-CM | POA: Diagnosis not present

## 2021-08-24 DIAGNOSIS — J188 Other pneumonia, unspecified organism: Secondary | ICD-10-CM | POA: Diagnosis not present

## 2021-08-24 DIAGNOSIS — D090 Carcinoma in situ of bladder: Secondary | ICD-10-CM | POA: Diagnosis not present

## 2021-08-24 DIAGNOSIS — D649 Anemia, unspecified: Secondary | ICD-10-CM | POA: Diagnosis not present

## 2021-08-24 DIAGNOSIS — J449 Chronic obstructive pulmonary disease, unspecified: Secondary | ICD-10-CM | POA: Diagnosis not present

## 2021-08-24 DIAGNOSIS — J9611 Chronic respiratory failure with hypoxia: Secondary | ICD-10-CM | POA: Diagnosis not present

## 2021-08-24 DIAGNOSIS — R9431 Abnormal electrocardiogram [ECG] [EKG]: Secondary | ICD-10-CM | POA: Diagnosis not present

## 2021-08-24 DIAGNOSIS — C61 Malignant neoplasm of prostate: Secondary | ICD-10-CM | POA: Diagnosis not present

## 2021-08-24 DIAGNOSIS — Z09 Encounter for follow-up examination after completed treatment for conditions other than malignant neoplasm: Secondary | ICD-10-CM | POA: Diagnosis not present

## 2021-08-28 DIAGNOSIS — I10 Essential (primary) hypertension: Secondary | ICD-10-CM | POA: Diagnosis not present

## 2021-08-28 DIAGNOSIS — J449 Chronic obstructive pulmonary disease, unspecified: Secondary | ICD-10-CM | POA: Diagnosis not present

## 2021-08-28 DIAGNOSIS — R0609 Other forms of dyspnea: Secondary | ICD-10-CM | POA: Diagnosis not present

## 2021-09-11 DIAGNOSIS — C61 Malignant neoplasm of prostate: Secondary | ICD-10-CM | POA: Diagnosis not present

## 2021-09-11 DIAGNOSIS — H44001 Unspecified purulent endophthalmitis, right eye: Secondary | ICD-10-CM | POA: Diagnosis not present

## 2021-09-11 DIAGNOSIS — D090 Carcinoma in situ of bladder: Secondary | ICD-10-CM | POA: Diagnosis not present

## 2021-09-11 DIAGNOSIS — J449 Chronic obstructive pulmonary disease, unspecified: Secondary | ICD-10-CM | POA: Diagnosis not present

## 2021-09-11 DIAGNOSIS — Z96641 Presence of right artificial hip joint: Secondary | ICD-10-CM | POA: Diagnosis not present

## 2021-09-18 DIAGNOSIS — R7309 Other abnormal glucose: Secondary | ICD-10-CM | POA: Diagnosis not present

## 2021-09-18 DIAGNOSIS — Z09 Encounter for follow-up examination after completed treatment for conditions other than malignant neoplasm: Secondary | ICD-10-CM | POA: Diagnosis not present

## 2021-09-18 DIAGNOSIS — J9611 Chronic respiratory failure with hypoxia: Secondary | ICD-10-CM | POA: Diagnosis not present

## 2021-09-18 DIAGNOSIS — J188 Other pneumonia, unspecified organism: Secondary | ICD-10-CM | POA: Diagnosis not present

## 2021-09-18 DIAGNOSIS — J449 Chronic obstructive pulmonary disease, unspecified: Secondary | ICD-10-CM | POA: Diagnosis not present

## 2021-09-18 DIAGNOSIS — D649 Anemia, unspecified: Secondary | ICD-10-CM | POA: Diagnosis not present

## 2021-09-18 DIAGNOSIS — R9431 Abnormal electrocardiogram [ECG] [EKG]: Secondary | ICD-10-CM | POA: Diagnosis not present

## 2021-09-18 DIAGNOSIS — C61 Malignant neoplasm of prostate: Secondary | ICD-10-CM | POA: Diagnosis not present

## 2021-09-18 DIAGNOSIS — D090 Carcinoma in situ of bladder: Secondary | ICD-10-CM | POA: Diagnosis not present

## 2021-09-19 ENCOUNTER — Other Ambulatory Visit: Payer: PPO

## 2021-09-19 DIAGNOSIS — Z7901 Long term (current) use of anticoagulants: Secondary | ICD-10-CM | POA: Diagnosis not present

## 2021-09-19 DIAGNOSIS — N400 Enlarged prostate without lower urinary tract symptoms: Secondary | ICD-10-CM | POA: Diagnosis not present

## 2021-09-19 DIAGNOSIS — Z9981 Dependence on supplemental oxygen: Secondary | ICD-10-CM | POA: Diagnosis not present

## 2021-09-19 DIAGNOSIS — I10 Essential (primary) hypertension: Secondary | ICD-10-CM | POA: Diagnosis not present

## 2021-09-19 DIAGNOSIS — Z96641 Presence of right artificial hip joint: Secondary | ICD-10-CM | POA: Diagnosis not present

## 2021-09-19 DIAGNOSIS — Z8546 Personal history of malignant neoplasm of prostate: Secondary | ICD-10-CM | POA: Diagnosis not present

## 2021-09-19 DIAGNOSIS — Z471 Aftercare following joint replacement surgery: Secondary | ICD-10-CM | POA: Diagnosis not present

## 2021-09-19 DIAGNOSIS — J9811 Atelectasis: Secondary | ICD-10-CM | POA: Diagnosis not present

## 2021-09-19 DIAGNOSIS — I2510 Atherosclerotic heart disease of native coronary artery without angina pectoris: Secondary | ICD-10-CM | POA: Diagnosis not present

## 2021-09-19 DIAGNOSIS — J441 Chronic obstructive pulmonary disease with (acute) exacerbation: Secondary | ICD-10-CM | POA: Diagnosis not present

## 2021-09-21 ENCOUNTER — Ambulatory Visit: Payer: PPO | Admitting: Urology

## 2021-09-21 ENCOUNTER — Encounter: Payer: Self-pay | Admitting: Urology

## 2021-09-21 VITALS — BP 134/73 | HR 101 | Ht 62.0 in | Wt 164.0 lb

## 2021-09-21 DIAGNOSIS — R310 Gross hematuria: Secondary | ICD-10-CM

## 2021-09-21 DIAGNOSIS — D090 Carcinoma in situ of bladder: Secondary | ICD-10-CM

## 2021-09-21 LAB — MICROSCOPIC EXAMINATION
Bacteria, UA: NONE SEEN
RBC, Urine: NONE SEEN /hpf (ref 0–2)

## 2021-09-21 LAB — URINALYSIS, COMPLETE
Bilirubin, UA: NEGATIVE
Glucose, UA: NEGATIVE
Ketones, UA: NEGATIVE
Leukocytes,UA: NEGATIVE
Nitrite, UA: NEGATIVE
Protein,UA: NEGATIVE
RBC, UA: NEGATIVE
Specific Gravity, UA: 1.02 (ref 1.005–1.030)
Urobilinogen, Ur: 0.2 mg/dL (ref 0.2–1.0)
pH, UA: 5.5 (ref 5.0–7.5)

## 2021-09-23 ENCOUNTER — Encounter: Payer: Self-pay | Admitting: Urology

## 2021-09-23 NOTE — Progress Notes (Signed)
? ?  09/23/21 ? ?CC:  ?Chief Complaint  ?Patient presents with  ? Cysto  ? ?Indications: ?Carcinoma in situ bladder biopsies 02/23/2020 ?Completed 6 week course of BCG induction 06/13/2020 ? ?HPI: Since last visit had right hip replacement 08/01/2021.  Readmitted for pneumonia/sepsis and a catheter was placed for urinary retention.  Successful outpatient voiding trial.  Baseline PVR 150-250 mL. ? ?Blood pressure 126/69, pulse 73, height '5\' 2"'$  (1.575 m), weight 175 lb (79.4 kg). ?NED. A&Ox3.   ?No respiratory distress   ?Abd soft, NT, ND ?Normal phallus with bilateral descended testicles ? ?Cystoscopy Procedure Note ? ?Patient identification was confirmed, informed consent was obtained, and patient was prepped using Betadine solution.  Lidocaine jelly was administered per urethral meatus.   ? ? ?Pre-Procedure: ?- Inspection reveals a normal caliber urethral meatus. ? ?Procedure: ?The flexible cystoscope was introduced without difficulty ?- No urethral strictures/lesions are present. ?-  Moderate lateral lobe enlargement  prostate  ?- Elevated bladder neck, moderate ?- Bilateral ureteral orifices identified ?- Bladder mucosa  reveals no ulcers, tumors, or lesions ?- No bladder stones ?-Moderate trabeculation with cellules/diverticula ?  ?Retroflexion shows small intravesical prostatic tissue with hypervascularity ? ?Post-Procedure: ?- Patient tolerated the procedure well ? ?Assessment/ Plan: ?No bladder mucosal abnormalities/recurrent tumor identified ?Continue quarterly surveillance until Fall 2023 ? ? ? ?Abbie Sons, MD ? ?

## 2021-09-26 ENCOUNTER — Other Ambulatory Visit: Payer: Self-pay | Admitting: *Deleted

## 2021-09-26 ENCOUNTER — Inpatient Hospital Stay: Payer: PPO | Attending: Radiation Oncology

## 2021-09-26 DIAGNOSIS — C61 Malignant neoplasm of prostate: Secondary | ICD-10-CM | POA: Insufficient documentation

## 2021-09-26 LAB — PSA: Prostatic Specific Antigen: 0.01 ng/mL (ref 0.00–4.00)

## 2021-09-27 DIAGNOSIS — B9689 Other specified bacterial agents as the cause of diseases classified elsewhere: Secondary | ICD-10-CM | POA: Diagnosis not present

## 2021-09-27 DIAGNOSIS — R0789 Other chest pain: Secondary | ICD-10-CM | POA: Diagnosis not present

## 2021-09-27 DIAGNOSIS — R051 Acute cough: Secondary | ICD-10-CM | POA: Diagnosis not present

## 2021-09-27 DIAGNOSIS — J019 Acute sinusitis, unspecified: Secondary | ICD-10-CM | POA: Diagnosis not present

## 2021-09-27 DIAGNOSIS — R059 Cough, unspecified: Secondary | ICD-10-CM | POA: Diagnosis not present

## 2021-09-27 DIAGNOSIS — J209 Acute bronchitis, unspecified: Secondary | ICD-10-CM | POA: Diagnosis not present

## 2021-10-03 ENCOUNTER — Encounter: Payer: Self-pay | Admitting: Radiation Oncology

## 2021-10-03 ENCOUNTER — Ambulatory Visit
Admission: RE | Admit: 2021-10-03 | Discharge: 2021-10-03 | Disposition: A | Discharge status: Home / Self Care | Payer: PPO | Source: Ambulatory Visit | Attending: Radiation Oncology | Admitting: Radiation Oncology

## 2021-10-03 VITALS — BP 151/72 | HR 86 | Resp 18 | Ht 62.0 in | Wt 168.0 lb

## 2021-10-03 DIAGNOSIS — Z923 Personal history of irradiation: Secondary | ICD-10-CM | POA: Diagnosis not present

## 2021-10-03 DIAGNOSIS — Z08 Encounter for follow-up examination after completed treatment for malignant neoplasm: Secondary | ICD-10-CM | POA: Diagnosis not present

## 2021-10-03 DIAGNOSIS — C61 Malignant neoplasm of prostate: Secondary | ICD-10-CM | POA: Insufficient documentation

## 2021-10-03 NOTE — Progress Notes (Signed)
Radiation Oncology ?Follow up Note ? ?Name: Ruben Green   ?Date:   10/03/2021 ?MRN:  [id number] ?DOB: 1934/04/29  ? ? ?This 88 y.o. male presents to the clinic today for 4-year follow-up status post IMRT radiation therapy to his prostate and pelvic nodes for stage IIb adenocarcinoma Gleason 8 (4+4) presenting with a PSA of 12.7. ? ?REFERRING PROVIDER: Tracie Harrier, MD ? ?HPI: Patient is a 88 year old male now at over 4 years having completed IMRT radiation therapy to his prostate and pelvic nodes for stage IIb adenocarcinoma Gleason 8.  Seen today in routine follow-up he is having very little lower urinary tract symptoms no diarrhea.  He had a recent hip fracture which she is recovering from now.  Is also recently had cystoscopy by Dr. Bernardo Heater showing no evidence of disease in his bladder he does have a history of transitional cell carcinoma..  His PSA remains less than 0.1. ? ?COMPLICATIONS OF TREATMENT: none ? ?FOLLOW UP COMPLIANCE: keeps appointments  ? ?PHYSICAL EXAM:  ?BP (!) 151/72 (BP Location: Left Arm, Patient Position: Sitting)   Pulse 86   Resp 18   Ht '5\' 2"'$  (1.575 m)   Wt 168 lb (76.2 kg)   BMI 30.73 kg/m?  ?Well-developed well-nourished patient in NAD. HEENT reveals PERLA, EOMI, discs not visualized.  Oral cavity is clear. No oral mucosal lesions are identified. Neck is clear without evidence of cervical or supraclavicular adenopathy. Lungs are clear to A&P. Cardiac examination is essentially unremarkable with regular rate and rhythm without murmur rub or thrill. Abdomen is benign with no organomegaly or masses noted. Motor sensory and DTR levels are equal and symmetric in the upper and lower extremities. Cranial nerves II through XII are grossly intact. Proprioception is intact. No peripheral adenopathy or edema is identified. No motor or sensory levels are noted. Crude visual fields are within normal range. ? ?RADIOLOGY RESULTS: No current films for review ? ?PLAN: Present time patient  is doing well now at over 4 years with no evidence of disease of his prostate cancer under excellent biochemical control.  At this time based on his age and mobility I am going to discontinue follow-up care.  He continues to follow-up care with urology.  To be happy to reevaluate the patient anytime. ? ?I would like to take this opportunity to thank you for allowing me to participate in the care of your patient.. ?  ? Noreene Filbert, MD ? ?

## 2021-10-23 DIAGNOSIS — R053 Chronic cough: Secondary | ICD-10-CM | POA: Diagnosis not present

## 2021-10-23 DIAGNOSIS — R531 Weakness: Secondary | ICD-10-CM | POA: Diagnosis not present

## 2021-10-23 DIAGNOSIS — R059 Cough, unspecified: Secondary | ICD-10-CM | POA: Diagnosis not present

## 2021-10-23 DIAGNOSIS — J439 Emphysema, unspecified: Secondary | ICD-10-CM | POA: Diagnosis not present

## 2021-10-23 DIAGNOSIS — I700 Atherosclerosis of aorta: Secondary | ICD-10-CM | POA: Diagnosis not present

## 2021-10-23 DIAGNOSIS — R195 Other fecal abnormalities: Secondary | ICD-10-CM | POA: Diagnosis not present

## 2021-10-23 DIAGNOSIS — Z0000 Encounter for general adult medical examination without abnormal findings: Secondary | ICD-10-CM | POA: Diagnosis not present

## 2021-10-23 DIAGNOSIS — E8809 Other disorders of plasma-protein metabolism, not elsewhere classified: Secondary | ICD-10-CM | POA: Diagnosis not present

## 2021-10-23 DIAGNOSIS — J432 Centrilobular emphysema: Secondary | ICD-10-CM | POA: Diagnosis not present

## 2021-10-23 DIAGNOSIS — D508 Other iron deficiency anemias: Secondary | ICD-10-CM | POA: Diagnosis not present

## 2021-10-30 DIAGNOSIS — M1611 Unilateral primary osteoarthritis, right hip: Secondary | ICD-10-CM | POA: Diagnosis not present

## 2021-10-30 DIAGNOSIS — Z96641 Presence of right artificial hip joint: Secondary | ICD-10-CM | POA: Diagnosis not present

## 2021-10-30 DIAGNOSIS — M1711 Unilateral primary osteoarthritis, right knee: Secondary | ICD-10-CM | POA: Diagnosis not present

## 2021-11-13 DIAGNOSIS — R399 Unspecified symptoms and signs involving the genitourinary system: Secondary | ICD-10-CM | POA: Diagnosis not present

## 2021-12-23 ENCOUNTER — Other Ambulatory Visit: Payer: Self-pay

## 2021-12-23 ENCOUNTER — Inpatient Hospital Stay
Admission: EM | Admit: 2021-12-23 | Discharge: 2021-12-28 | DRG: 871 | Disposition: A | Discharge status: Home / Self Care | Payer: PPO | Attending: Internal Medicine | Admitting: Internal Medicine

## 2021-12-23 ENCOUNTER — Emergency Department: Payer: PPO

## 2021-12-23 DIAGNOSIS — I2510 Atherosclerotic heart disease of native coronary artery without angina pectoris: Secondary | ICD-10-CM | POA: Diagnosis not present

## 2021-12-23 DIAGNOSIS — Z96641 Presence of right artificial hip joint: Secondary | ICD-10-CM | POA: Diagnosis not present

## 2021-12-23 DIAGNOSIS — E785 Hyperlipidemia, unspecified: Secondary | ICD-10-CM | POA: Diagnosis present

## 2021-12-23 DIAGNOSIS — K449 Diaphragmatic hernia without obstruction or gangrene: Secondary | ICD-10-CM | POA: Diagnosis present

## 2021-12-23 DIAGNOSIS — I509 Heart failure, unspecified: Secondary | ICD-10-CM | POA: Diagnosis not present

## 2021-12-23 DIAGNOSIS — E538 Deficiency of other specified B group vitamins: Secondary | ICD-10-CM | POA: Diagnosis not present

## 2021-12-23 DIAGNOSIS — Z9842 Cataract extraction status, left eye: Secondary | ICD-10-CM

## 2021-12-23 DIAGNOSIS — J432 Centrilobular emphysema: Secondary | ICD-10-CM | POA: Diagnosis not present

## 2021-12-23 DIAGNOSIS — R000 Tachycardia, unspecified: Secondary | ICD-10-CM | POA: Diagnosis not present

## 2021-12-23 DIAGNOSIS — N1831 Chronic kidney disease, stage 3a: Secondary | ICD-10-CM | POA: Diagnosis not present

## 2021-12-23 DIAGNOSIS — E669 Obesity, unspecified: Secondary | ICD-10-CM | POA: Diagnosis not present

## 2021-12-23 DIAGNOSIS — I130 Hypertensive heart and chronic kidney disease with heart failure and stage 1 through stage 4 chronic kidney disease, or unspecified chronic kidney disease: Secondary | ICD-10-CM | POA: Diagnosis present

## 2021-12-23 DIAGNOSIS — J302 Other seasonal allergic rhinitis: Secondary | ICD-10-CM | POA: Diagnosis present

## 2021-12-23 DIAGNOSIS — F419 Anxiety disorder, unspecified: Secondary | ICD-10-CM | POA: Diagnosis present

## 2021-12-23 DIAGNOSIS — I10 Essential (primary) hypertension: Secondary | ICD-10-CM | POA: Diagnosis present

## 2021-12-23 DIAGNOSIS — A419 Sepsis, unspecified organism: Secondary | ICD-10-CM | POA: Diagnosis not present

## 2021-12-23 DIAGNOSIS — N138 Other obstructive and reflux uropathy: Secondary | ICD-10-CM | POA: Diagnosis present

## 2021-12-23 DIAGNOSIS — Z87891 Personal history of nicotine dependence: Secondary | ICD-10-CM

## 2021-12-23 DIAGNOSIS — Z6830 Body mass index (BMI) 30.0-30.9, adult: Secondary | ICD-10-CM

## 2021-12-23 DIAGNOSIS — J988 Other specified respiratory disorders: Secondary | ICD-10-CM

## 2021-12-23 DIAGNOSIS — R509 Fever, unspecified: Secondary | ICD-10-CM

## 2021-12-23 DIAGNOSIS — A403 Sepsis due to Streptococcus pneumoniae: Secondary | ICD-10-CM | POA: Diagnosis not present

## 2021-12-23 DIAGNOSIS — D649 Anemia, unspecified: Secondary | ICD-10-CM | POA: Diagnosis present

## 2021-12-23 DIAGNOSIS — I714 Abdominal aortic aneurysm, without rupture, unspecified: Secondary | ICD-10-CM | POA: Diagnosis not present

## 2021-12-23 DIAGNOSIS — J188 Other pneumonia, unspecified organism: Secondary | ICD-10-CM | POA: Diagnosis not present

## 2021-12-23 DIAGNOSIS — I110 Hypertensive heart disease with heart failure: Secondary | ICD-10-CM | POA: Diagnosis not present

## 2021-12-23 DIAGNOSIS — Z20822 Contact with and (suspected) exposure to covid-19: Secondary | ICD-10-CM | POA: Diagnosis not present

## 2021-12-23 DIAGNOSIS — N179 Acute kidney failure, unspecified: Secondary | ICD-10-CM | POA: Diagnosis not present

## 2021-12-23 DIAGNOSIS — Z8616 Personal history of COVID-19: Secondary | ICD-10-CM

## 2021-12-23 DIAGNOSIS — I5033 Acute on chronic diastolic (congestive) heart failure: Secondary | ICD-10-CM

## 2021-12-23 DIAGNOSIS — J9621 Acute and chronic respiratory failure with hypoxia: Secondary | ICD-10-CM | POA: Diagnosis not present

## 2021-12-23 DIAGNOSIS — R7989 Other specified abnormal findings of blood chemistry: Secondary | ICD-10-CM | POA: Diagnosis present

## 2021-12-23 DIAGNOSIS — N401 Enlarged prostate with lower urinary tract symptoms: Secondary | ICD-10-CM | POA: Diagnosis present

## 2021-12-23 DIAGNOSIS — K224 Dyskinesia of esophagus: Secondary | ICD-10-CM | POA: Diagnosis not present

## 2021-12-23 DIAGNOSIS — Z87442 Personal history of urinary calculi: Secondary | ICD-10-CM

## 2021-12-23 DIAGNOSIS — J9601 Acute respiratory failure with hypoxia: Secondary | ICD-10-CM | POA: Diagnosis not present

## 2021-12-23 DIAGNOSIS — R0602 Shortness of breath: Principal | ICD-10-CM

## 2021-12-23 DIAGNOSIS — J441 Chronic obstructive pulmonary disease with (acute) exacerbation: Secondary | ICD-10-CM | POA: Diagnosis not present

## 2021-12-23 DIAGNOSIS — J449 Chronic obstructive pulmonary disease, unspecified: Secondary | ICD-10-CM

## 2021-12-23 DIAGNOSIS — I214 Non-ST elevation (NSTEMI) myocardial infarction: Secondary | ICD-10-CM | POA: Diagnosis not present

## 2021-12-23 DIAGNOSIS — Z961 Presence of intraocular lens: Secondary | ICD-10-CM | POA: Diagnosis present

## 2021-12-23 DIAGNOSIS — R0902 Hypoxemia: Secondary | ICD-10-CM | POA: Diagnosis not present

## 2021-12-23 DIAGNOSIS — J189 Pneumonia, unspecified organism: Secondary | ICD-10-CM | POA: Diagnosis not present

## 2021-12-23 DIAGNOSIS — I5032 Chronic diastolic (congestive) heart failure: Secondary | ICD-10-CM

## 2021-12-23 DIAGNOSIS — I700 Atherosclerosis of aorta: Secondary | ICD-10-CM | POA: Diagnosis not present

## 2021-12-23 DIAGNOSIS — Z7901 Long term (current) use of anticoagulants: Secondary | ICD-10-CM

## 2021-12-23 DIAGNOSIS — J90 Pleural effusion, not elsewhere classified: Secondary | ICD-10-CM | POA: Diagnosis not present

## 2021-12-23 DIAGNOSIS — I4510 Unspecified right bundle-branch block: Secondary | ICD-10-CM | POA: Diagnosis present

## 2021-12-23 DIAGNOSIS — R778 Other specified abnormalities of plasma proteins: Secondary | ICD-10-CM | POA: Diagnosis present

## 2021-12-23 DIAGNOSIS — Z87440 Personal history of urinary (tract) infections: Secondary | ICD-10-CM

## 2021-12-23 DIAGNOSIS — R069 Unspecified abnormalities of breathing: Secondary | ICD-10-CM | POA: Diagnosis not present

## 2021-12-23 DIAGNOSIS — Z79899 Other long term (current) drug therapy: Secondary | ICD-10-CM

## 2021-12-23 DIAGNOSIS — Z8546 Personal history of malignant neoplasm of prostate: Secondary | ICD-10-CM

## 2021-12-23 DIAGNOSIS — Z9841 Cataract extraction status, right eye: Secondary | ICD-10-CM

## 2021-12-23 DIAGNOSIS — R0689 Other abnormalities of breathing: Secondary | ICD-10-CM | POA: Diagnosis not present

## 2021-12-23 DIAGNOSIS — H3530 Unspecified macular degeneration: Secondary | ICD-10-CM | POA: Diagnosis present

## 2021-12-23 LAB — COMPREHENSIVE METABOLIC PANEL
ALT: 25 U/L (ref 0–44)
AST: 28 U/L (ref 15–41)
Albumin: 3.6 g/dL (ref 3.5–5.0)
Alkaline Phosphatase: 73 U/L (ref 38–126)
Anion gap: 6 (ref 5–15)
BUN: 23 mg/dL (ref 8–23)
CO2: 23 mmol/L (ref 22–32)
Calcium: 8.5 mg/dL — ABNORMAL LOW (ref 8.9–10.3)
Chloride: 113 mmol/L — ABNORMAL HIGH (ref 98–111)
Creatinine, Ser: 1.25 mg/dL — ABNORMAL HIGH (ref 0.61–1.24)
GFR, Estimated: 56 mL/min — ABNORMAL LOW (ref >60)
Glucose, Bld: 164 mg/dL — ABNORMAL HIGH (ref 70–99)
Potassium: 5.3 mmol/L — ABNORMAL HIGH (ref 3.5–5.1)
Sodium: 142 mmol/L (ref 135–145)
Total Bilirubin: 0.7 mg/dL (ref 0.3–1.2)
Total Protein: 6.6 g/dL (ref 6.5–8.1)

## 2021-12-23 LAB — URINALYSIS, COMPLETE (UACMP) WITH MICROSCOPIC
Bacteria, UA: NONE SEEN
Bilirubin Urine: NEGATIVE
Glucose, UA: NEGATIVE mg/dL
Hgb urine dipstick: NEGATIVE
Ketones, ur: NEGATIVE mg/dL
Leukocytes,Ua: NEGATIVE
Nitrite: NEGATIVE
Protein, ur: NEGATIVE mg/dL
Specific Gravity, Urine: 1.012 (ref 1.005–1.030)
Squamous Epithelial / HPF: NONE SEEN (ref 0–5)
pH: 6 (ref 5.0–8.0)

## 2021-12-23 LAB — CBC WITH DIFFERENTIAL/PLATELET
Abs Immature Granulocytes: 0.06 10*3/uL (ref 0.00–0.07)
Basophils Absolute: 0 10*3/uL (ref 0.0–0.1)
Basophils Relative: 0 %
Eosinophils Absolute: 0 10*3/uL (ref 0.0–0.5)
Eosinophils Relative: 0 %
HCT: 33.3 % — ABNORMAL LOW (ref 39.0–52.0)
Hemoglobin: 9.6 g/dL — ABNORMAL LOW (ref 13.0–17.0)
Immature Granulocytes: 1 %
Lymphocytes Relative: 4 %
Lymphs Abs: 0.6 10*3/uL — ABNORMAL LOW (ref 0.7–4.0)
MCH: 26.2 pg (ref 26.0–34.0)
MCHC: 28.8 g/dL — ABNORMAL LOW (ref 30.0–36.0)
MCV: 90.7 fL (ref 80.0–100.0)
Monocytes Absolute: 0.6 10*3/uL (ref 0.1–1.0)
Monocytes Relative: 5 %
Neutro Abs: 11.9 10*3/uL — ABNORMAL HIGH (ref 1.7–7.7)
Neutrophils Relative %: 90 %
Platelets: 220 10*3/uL (ref 150–400)
RBC: 3.67 MIL/uL — ABNORMAL LOW (ref 4.22–5.81)
RDW: 17.3 % — ABNORMAL HIGH (ref 11.5–15.5)
WBC: 13.2 10*3/uL — ABNORMAL HIGH (ref 4.0–10.5)
nRBC: 0 % (ref 0.0–0.2)

## 2021-12-23 LAB — HEPARIN LEVEL (UNFRACTIONATED)
Heparin Unfractionated: <0.1 IU/mL — ABNORMAL LOW (ref 0.30–0.70)
Heparin Unfractionated: 0.33 IU/mL (ref 0.30–0.70)
Heparin Unfractionated: 0.28 IU/mL — ABNORMAL LOW (ref 0.30–0.70)

## 2021-12-23 LAB — BLOOD GAS, ARTERIAL
Acid-base deficit: 1.7 mmol/L (ref 0.0–2.0)
Bicarbonate: 21.9 mmol/L (ref 20.0–28.0)
Delivery systems: POSITIVE
Expiratory PAP: 8 cmH2O
FIO2: 45 %
Inspiratory PAP: 14 cmH2O
O2 Saturation: 96.3 %
Patient temperature: 37
pCO2 arterial: 33 mmHg (ref 32–48)
pH, Arterial: 7.43 (ref 7.35–7.45)
pO2, Arterial: 79 mmHg — ABNORMAL LOW (ref 83–108)

## 2021-12-23 LAB — TROPONIN I (HIGH SENSITIVITY)
Troponin I (High Sensitivity): 82 ng/L — ABNORMAL HIGH (ref <18)
Troponin I (High Sensitivity): 1290 ng/L (ref <18)
Troponin I (High Sensitivity): 1969 ng/L (ref <18)
Troponin I (High Sensitivity): 1357 ng/L (ref <18)

## 2021-12-23 LAB — MAGNESIUM: Magnesium: 1.9 mg/dL (ref 1.7–2.4)

## 2021-12-23 LAB — APTT
aPTT: 28 seconds (ref 24–36)
aPTT: 72 seconds — ABNORMAL HIGH (ref 24–36)

## 2021-12-23 LAB — RESP PANEL BY RT-PCR (FLU A&B, COVID) ARPGX2
Influenza A by PCR: NEGATIVE
Influenza B by PCR: NEGATIVE
SARS Coronavirus 2 by RT PCR: NEGATIVE

## 2021-12-23 LAB — LACTIC ACID, PLASMA: Lactic Acid, Venous: 1.4 mmol/L (ref 0.5–1.9)

## 2021-12-23 LAB — PROTIME-INR
INR: 1.1 (ref 0.8–1.2)
Prothrombin Time: 13.8 seconds (ref 11.4–15.2)

## 2021-12-23 LAB — BRAIN NATRIURETIC PEPTIDE: B Natriuretic Peptide: 415.5 pg/mL — ABNORMAL HIGH (ref 0.0–100.0)

## 2021-12-23 LAB — PROCALCITONIN: Procalcitonin: 4.86 ng/mL

## 2021-12-23 MED ORDER — VANCOMYCIN HCL 1750 MG/350ML IV SOLN
1750.0000 mg | Freq: Once | INTRAVENOUS | Status: AC
  Administered 2021-12-23: 1750 mg via INTRAVENOUS
  Filled 2021-12-23: qty 350

## 2021-12-23 MED ORDER — LACTATED RINGERS IV BOLUS (SEPSIS)
1000.0000 mL | Freq: Once | INTRAVENOUS | Status: DC
  Administered 2021-12-23: 1000 mL via INTRAVENOUS

## 2021-12-23 MED ORDER — ACETAMINOPHEN 500 MG PO TABS
1000.0000 mg | ORAL_TABLET | Freq: Once | ORAL | Status: AC
  Administered 2021-12-23: 1000 mg via ORAL
  Filled 2021-12-23: qty 2

## 2021-12-23 MED ORDER — SODIUM CHLORIDE 0.9 % IV SOLN
500.0000 mg | INTRAVENOUS | Status: DC
  Administered 2021-12-23 – 2021-12-26 (×4): 500 mg via INTRAVENOUS
  Filled 2021-12-23 (×4): qty 5

## 2021-12-23 MED ORDER — METHYLPREDNISOLONE SODIUM SUCC 125 MG IJ SOLR
125.0000 mg | Freq: Once | INTRAMUSCULAR | Status: AC
Start: 2021-12-23 — End: 2021-12-23
  Administered 2021-12-23: 125 mg via INTRAVENOUS
  Filled 2021-12-23: qty 2

## 2021-12-23 MED ORDER — SODIUM CHLORIDE 0.9 % IV SOLN
2.0000 g | INTRAVENOUS | Status: DC
  Administered 2021-12-24 – 2021-12-26 (×3): 2 g via INTRAVENOUS
  Filled 2021-12-23 (×3): qty 20

## 2021-12-23 MED ORDER — HEPARIN (PORCINE) 25000 UT/250ML-% IV SOLN
1150.0000 [IU]/h | INTRAVENOUS | Status: DC
  Administered 2021-12-23: 850 [IU]/h via INTRAVENOUS
  Administered 2021-12-24: 1000 [IU]/h via INTRAVENOUS
  Administered 2021-12-25: 1150 [IU]/h via INTRAVENOUS
  Filled 2021-12-23 (×2): qty 250

## 2021-12-23 MED ORDER — LACTATED RINGERS IV SOLN: INTRAVENOUS | Status: DC

## 2021-12-23 MED ORDER — NITROGLYCERIN 0.4 MG SL SUBL: 0.4000 mg | SUBLINGUAL_TABLET | SUBLINGUAL | Status: DC | PRN

## 2021-12-23 MED ORDER — HEPARIN BOLUS VIA INFUSION
1000.0000 [IU] | Freq: Once | INTRAVENOUS | Status: AC
  Administered 2021-12-23: 1000 [IU] via INTRAVENOUS
  Filled 2021-12-23: qty 1000

## 2021-12-23 MED ORDER — ACETAMINOPHEN 325 MG PO TABS
650.0000 mg | ORAL_TABLET | ORAL | Status: DC | PRN
  Administered 2021-12-24 – 2021-12-26 (×2): 650 mg via ORAL
  Filled 2021-12-23 (×4): qty 2

## 2021-12-23 MED ORDER — LACTATED RINGERS IV SOLN: INTRAVENOUS | Status: AC

## 2021-12-23 MED ORDER — VANCOMYCIN HCL IN DEXTROSE 1-5 GM/200ML-% IV SOLN
1000.0000 mg | Freq: Once | INTRAVENOUS | Status: DC
  Filled 2021-12-23: qty 200

## 2021-12-23 MED ORDER — ATORVASTATIN CALCIUM 20 MG PO TABS
40.0000 mg | ORAL_TABLET | Freq: Every day | ORAL | Status: DC
  Administered 2021-12-23 – 2021-12-28 (×6): 40 mg via ORAL
  Filled 2021-12-23 (×6): qty 2

## 2021-12-23 MED ORDER — SODIUM CHLORIDE 0.9 % IV SOLN
2.0000 g | Freq: Once | INTRAVENOUS | Status: AC
  Administered 2021-12-23: 2 g via INTRAVENOUS
  Filled 2021-12-23: qty 12.5

## 2021-12-23 MED ORDER — FUROSEMIDE 10 MG/ML IJ SOLN
20.0000 mg | Freq: Two times a day (BID) | INTRAMUSCULAR | Status: DC
Start: 2021-12-23 — End: 2021-12-24
  Administered 2021-12-23 (×2): 20 mg via INTRAVENOUS
  Filled 2021-12-23 (×2): qty 2
  Filled 2021-12-23: qty 4

## 2021-12-23 MED ORDER — ASPIRIN 300 MG RE SUPP
300.0000 mg | RECTAL | Status: AC
  Administered 2021-12-23: 300 mg via RECTAL

## 2021-12-23 MED ORDER — ASPIRIN 81 MG PO CHEW: 324.0000 mg | CHEWABLE_TABLET | ORAL | Status: AC

## 2021-12-23 MED ORDER — IPRATROPIUM-ALBUTEROL 0.5-2.5 (3) MG/3ML IN SOLN
3.0000 mL | Freq: Once | RESPIRATORY_TRACT | Status: AC
  Administered 2021-12-23: 3 mL via RESPIRATORY_TRACT
  Filled 2021-12-23: qty 3

## 2021-12-23 MED ORDER — LOSARTAN POTASSIUM 50 MG PO TABS
100.0000 mg | ORAL_TABLET | Freq: Every day | ORAL | Status: DC
  Administered 2021-12-23 – 2021-12-24 (×2): 100 mg via ORAL
  Filled 2021-12-23 (×2): qty 2

## 2021-12-23 MED ORDER — HEPARIN BOLUS VIA INFUSION
4000.0000 [IU] | Freq: Once | INTRAVENOUS | Status: AC
  Administered 2021-12-23: 4000 [IU] via INTRAVENOUS
  Filled 2021-12-23: qty 4000

## 2021-12-23 MED ORDER — FUROSEMIDE 10 MG/ML IJ SOLN
40.0000 mg | Freq: Once | INTRAMUSCULAR | Status: AC
Start: 2021-12-23 — End: 2021-12-23
  Administered 2021-12-23: 40 mg via INTRAVENOUS
  Filled 2021-12-23: qty 4

## 2021-12-23 MED ORDER — ASPIRIN 81 MG PO TBEC
81.0000 mg | DELAYED_RELEASE_TABLET | Freq: Every day | ORAL | Status: DC
  Administered 2021-12-24 – 2021-12-28 (×5): 81 mg via ORAL
  Filled 2021-12-23 (×5): qty 1

## 2021-12-23 MED ORDER — ONDANSETRON HCL 4 MG/2ML IJ SOLN: 4.0000 mg | Freq: Four times a day (QID) | INTRAMUSCULAR | Status: DC | PRN

## 2021-12-23 NOTE — Assessment & Plan Note (Addendum)
Acute bronchitis versus CAP though latter not seen on x-ray Rocephin and azithromycin Follow-up blood cultures

## 2021-12-23 NOTE — Progress Notes (Signed)
ANTICOAGULATION CONSULT NOTE - Initial Consult  Pharmacy Consult for Heparin  Indication: chest pain/ACS  Allergies  Allergen Reactions   Oxycodone Other (See Comments)    Caused hallucinations/does not want anymore    Patient Measurements: Weight: 77.1 kg (170 lb) Heparin Dosing Weight: 70.9 kg   Vital Signs: Temp: 98.1 F (36.7 C) (07/08 1200) Temp Source: Oral (07/08 1200) BP: 142/66 (07/08 1200) Pulse Rate: 91 (07/08 1200)  Labs: Recent Labs    12/23/21 0140 12/23/21 0342 12/23/21 0851 12/23/21 1418  HGB 9.6*  --   --   --   HCT 33.3*  --   --   --   PLT 220  --   --   --   APTT 28  --  72*  --   LABPROT 13.8  --   --   --   INR 1.1  --   --   --   HEPARINUNFRC <0.10*  --   --  0.33  CREATININE 1.25*  --   --   --   TROPONINIHS 82* 1,290* 1,969*  --      Estimated Creatinine Clearance: 38.2 mL/min (A) (by C-G formula based on SCr of 1.25 mg/dL (H)).   Medical History: Past Medical History:  Diagnosis Date   AAA (abdominal aortic aneurysm) ([place])    a.) s/p repair in 2005   Anemia    Anginal pain ([place])    Anxiety    Arthritis    B12 deficiency    Bilateral cataracts    a.) s/p BILATERAL extractions in 2018   BPH with obstruction/lower urinary tract symptoms    CAD (coronary artery disease)    Carotid artery stenosis    a.) s/p CEA on the RIGHT   COPD (chronic obstructive pulmonary disease) (HCC)    Diastolic dysfunction 71/11/2692   a.)  TTE 07/29/2019: EF 50-55%; LA mildly enlarged; G1DD.   DOE (dyspnea on exertion)    Elevated PSA    Environmental and seasonal allergies    History of 2019 novel coronavirus disease (COVID-19)    History of kidney stones    HLD (hyperlipidemia)    HOH (hard of hearing)    HTN (hypertension)    Incomplete bladder emptying    Macular degeneration    Prostate cancer (HCC)    RBBB (right bundle branch block)    Valvular insufficiency    a.) TTE 07/29/2019: LVEF 50-55%; LA mild enlarged; triv AR/PR, mild MR,  mod TR.    Medications:  (Not in a hospital admission)  Assessment: Pharmacy consulted to dose heparin in this 88 year old male admitted with ACS/NSTEMI.   Pt was on Eliquis 5 mg PO BID PTA.  Pt Wife states he hasn't taken Eliquis since this past April.  CrCl = 38.2 ml/min   Goal of Therapy:  Heparin level 0.3-0.7 units/ml aPTT 66 - 102 seconds Monitor platelets by anticoagulation protocol: Yes   Plan:  7/8'@1418'$ : HL 0.33, therapeutic x1 Will continue heparin infusion at 850 units/hr.  Will check confirmatory HL in 8 hours CBC daily   Chevette Fee A Jaymon Dudek 12/23/2021,2:48 PM

## 2021-12-23 NOTE — Progress Notes (Signed)
Pt being followed by [facility] for Sepsis protocol. 

## 2021-12-23 NOTE — ED Provider Notes (Signed)
[facility] Provider Note    Event Date/Time   First MD Initiated Contact with Patient 12/23/21 0130     (approximate)   History   Shortness of Breath   HPI  Ruben Green is a 88 y.o. male with history of COPD not on home oxygen, diastolic heart failure, hypertension, hyperlipidemia, AAA status postrepair who presents to the emergency department EMS for concerns for chills, cough, congestion, shortness of breath, chest pain that started tonight.  Found to have axillary temperature of 100.9 with EMS.  No abdominal pain, vomiting, diarrhea, urinary symptoms.  Sats in the 80s with EMS on room air.  Placed on nonrebreather.  Sats here dropped to 72% on room air when taken off of nonrebreather.   History provided by patient, wife, EMS.    Past Medical History:  Diagnosis Date   AAA (abdominal aortic aneurysm) ([place])    a.) s/p repair in 2005   Anemia    Anginal pain ([place])    Anxiety    Arthritis    B12 deficiency    Bilateral cataracts    a.) s/p BILATERAL extractions in 2018   BPH with obstruction/lower urinary tract symptoms    CAD (coronary artery disease)    Carotid artery stenosis    a.) s/p CEA on the RIGHT   COPD (chronic obstructive pulmonary disease) (HCC)    Diastolic dysfunction 17/40/8144   a.)  TTE 07/29/2019: EF 50-55%; LA mildly enlarged; G1DD.   DOE (dyspnea on exertion)    Elevated PSA    Environmental and seasonal allergies    History of 2019 novel coronavirus disease (COVID-19)    History of kidney stones    HLD (hyperlipidemia)    HOH (hard of hearing)    HTN (hypertension)    Incomplete bladder emptying    Macular degeneration    Prostate cancer (HCC)    RBBB (right bundle branch block)    Valvular insufficiency    a.) TTE 07/29/2019: LVEF 50-55%; LA mild enlarged; triv AR/PR, mild MR, mod TR.    Past Surgical History:  Procedure Laterality Date   ABDOMINAL AORTIC ANEURYSM REPAIR     CATARACT EXTRACTION W/PHACO  Right 09/25/2016   Procedure: CATARACT EXTRACTION PHACO AND INTRAOCULAR LENS PLACEMENT (IOC);  Surgeon: Birder Robson, MD;  Location: [facility] ORS;  Service: Ophthalmology;  Laterality: Right;  [place] 01:01 AP% 17.4 CDE 10.75 Fluid pack lot # [id number]   CATARACT EXTRACTION W/PHACO Left 10/16/2016   Procedure: CATARACT EXTRACTION PHACO AND INTRAOCULAR LENS PLACEMENT ([place]) Suture placed in Left eye;  Surgeon: Birder Robson, MD;  Location: [facility] ORS;  Service: Ophthalmology;  Laterality: Left;  [place] 2:06.8 AP% 22.2 CDE 28.17 Fluid pack lot # [id number]   COLONOSCOPY WITH PROPOFOL N/A 03/31/2019   Procedure: COLONOSCOPY WITH PROPOFOL;  Surgeon: Lollie Sails, MD;  Location: [facility] ENDOSCOPY;  Service: Endoscopy;  Laterality: N/A;   COLONOSCOPY WITH PROPOFOL N/A 09/09/2020   Procedure: COLONOSCOPY WITH PROPOFOL;  Surgeon: Lesly Rubenstein, MD;  Location: [facility] ENDOSCOPY;  Service: Endoscopy;  Laterality: N/A;   CYSTOSCOPY W/ RETROGRADES Bilateral 02/23/2020   Procedure: CYSTOSCOPY WITH RETROGRADE PYELOGRAM;  Surgeon: Abbie Sons, MD;  Location: [facility] ORS;  Service: Urology;  Laterality: Bilateral;   CYSTOSCOPY WITH BIOPSY N/A 02/23/2020   Procedure: CYSTOSCOPY WITH BIOPSY;  Surgeon: Abbie Sons, MD;  Location: [facility] ORS;  Service: Urology;  Laterality: N/A;   EYE SURGERY     HERNIA REPAIR     x 2  INGUINAL HERNIA REPAIR Right 12/02/2015   Procedure: HERNIA REPAIR INGUINAL ADULT;  Surgeon: Leonie Green, MD;  Location: [facility] ORS;  Service: General;  Laterality: Right;   PROSTATE SURGERY  2012   Right Carotid Endarterectomy     TOTAL HIP ARTHROPLASTY Right 08/01/2021   Procedure: TOTAL HIP ARTHROPLASTY;  Surgeon: Corky Mull, MD;  Location: [facility] ORS;  Service: Orthopedics;  Laterality: Right;    MEDICATIONS:  Prior to Admission medications   Medication Sig Start Date End Date Taking? Authorizing Provider  acetaminophen (TYLENOL) 500 MG tablet Take 1-2 tablets (500-1,000 mg total) by  mouth every 6 (six) hours as needed for mild pain. 08/04/21   Lattie Corns, PA-C  apixaban (ELIQUIS) 2.5 MG TABS tablet Take 1 tablet (2.5 mg total) by mouth 2 (two) times daily. 08/04/21   Lattie Corns, PA-C  atorvastatin (LIPITOR) 40 MG tablet Take 40 mg by mouth daily.    [provider]  Fluticasone-Umeclidin-Vilant (TRELEGY ELLIPTA) 100-62.5-25 MCG/INH AEPB Inhale 2 puffs into the lungs daily. 09/01/19   [provider]  iron polysaccharides (NIFEREX) 150 MG capsule Take 150 mg by mouth daily. 03/03/20   [provider]  losartan (COZAAR) 100 MG tablet Take 100 mg by mouth daily.  09/04/16   [provider]  ondansetron (ZOFRAN) 4 MG tablet Take 1 tablet (4 mg total) by mouth every 6 (six) hours as needed for nausea. 08/04/21   Lattie Corns, PA-C  tamsulosin (FLOMAX) 0.4 MG CAPS capsule Take 1 capsule (0.4 mg total) by mouth daily. 08/04/21   Lattie Corns, PA-C    Physical Exam   Triage Vital Signs: ED Triage Vitals  Enc Vitals Group     BP --      Pulse --      Resp --      Temp 12/23/21 0139 (!) 102.2 F (39 C)     Temp Source 12/23/21 0139 Rectal     SpO2 --      Weight 12/23/21 0133 170 lb (77.1 kg)     Height --      Head Circumference --      Peak Flow --      Pain Score 12/23/21 0133 0     Pain Loc --      Pain Edu? --      Excl. in [place]? --     Most recent vital signs: Vitals:   12/23/21 0441 12/23/21 0530  BP: (!) 146/58 134/67  Pulse: (!) 115 (!) 110  Resp: (!) 23 (!) 24  Temp:    SpO2: 96% 95%    CONSTITUTIONAL: Alert and oriented and responds appropriately to questions. Well-appearing; well-nourished HEAD: Normocephalic, atraumatic EYES: Conjunctivae clear, pupils appear equal, sclera nonicteric ENT: normal nose; moist mucous membranes NECK: Supple, normal ROM CARD: Regular and tachycardic; S1 and S2 appreciated; no murmurs, no clicks, no rubs, no gallops RESP: Diffuse rhonchorous breath sounds  with some scattered wheezing.  Sats 72% on room air at rest.  Has increased work of breathing.  No respiratory distress. ABD/GI: Normal bowel sounds; non-distended; soft, non-tender, no rebound, no guarding, no peritoneal signs BACK: The back appears normal EXT: Normal ROM in all joints; no deformity noted, no edema; no cyanosis SKIN: Normal color for age and race; warm; no rash on exposed skin NEURO: Moves all extremities equally, normal speech PSYCH: The patient's mood and manner are appropriate.   ED Results / Procedures / Treatments   LABS: (all labs  ordered are listed, but only abnormal results are displayed) Labs Reviewed  COMPREHENSIVE METABOLIC PANEL - Abnormal; Notable for the following components:      Result Value   Potassium 5.3 (*)    Chloride 113 (*)    Glucose, Bld 164 (*)    Creatinine, Ser 1.25 (*)    Calcium 8.5 (*)    GFR, Estimated 56 (*)    All other components within normal limits  CBC WITH DIFFERENTIAL/PLATELET - Abnormal; Notable for the following components:   WBC 13.2 (*)    RBC 3.67 (*)    Hemoglobin 9.6 (*)    HCT 33.3 (*)    MCHC 28.8 (*)    RDW 17.3 (*)    Neutro Abs 11.9 (*)    Lymphs Abs 0.6 (*)    All other components within normal limits  URINALYSIS, COMPLETE (UACMP) WITH MICROSCOPIC - Abnormal; Notable for the following components:   Color, Urine STRAW (*)    APPearance CLEAR (*)    All other components within normal limits  BRAIN NATRIURETIC PEPTIDE - Abnormal; Notable for the following components:   B Natriuretic Peptide 415.5 (*)    All other components within normal limits  BLOOD GAS, ARTERIAL - Abnormal; Notable for the following components:   pO2, Arterial 79 (*)    All other components within normal limits  HEPARIN LEVEL (UNFRACTIONATED) - Abnormal; Notable for the following components:   Heparin Unfractionated <0.10 (*)    All other components within normal limits  TROPONIN I (HIGH SENSITIVITY) - Abnormal; Notable for the  following components:   Troponin I (High Sensitivity) 82 (*)    All other components within normal limits  TROPONIN I (HIGH SENSITIVITY) - Abnormal; Notable for the following components:   Troponin I (High Sensitivity) 1,290 (*)    All other components within normal limits  RESP PANEL BY RT-PCR (FLU A&B, COVID) ARPGX2  CULTURE, BLOOD (ROUTINE X 2)  CULTURE, BLOOD (ROUTINE X 2)  URINE CULTURE  LACTIC ACID, PLASMA  PROTIME-INR  APTT  MAGNESIUM  LIPOPROTEIN A (LPA)  APTT  HEPARIN LEVEL (UNFRACTIONATED)     EKG:  EKG Interpretation  Date/Time:  Saturday December 23 2021 01:39:48 EDT Ventricular Rate:  130 PR Interval:  117 QRS Duration: 138 QT Interval:  328 QTC Calculation: 483 R Axis:   191 Text Interpretation: Sinus tachycardia Multiple premature complexes, vent & supraven Right bundle branch block Confirmed by Pryor Curia [phone]) on 12/23/2021 2:07:27 AM         RADIOLOGY: My personal review and interpretation of imaging: Chest x-ray concerning for CHF  I have personally reviewed all radiology reports.   DG Chest Port 1 View  Result Date: 12/23/2021 CLINICAL DATA:  Shortness of breath EXAM: PORTABLE CHEST 1 VIEW COMPARISON:  08/09/2021 FINDINGS: Cardiac shadow is within normal limits. Aortic calcifications are again seen. Lungs are well aerated bilaterally. Mild vascular congestion is noted with interstitial edema. No bony abnormality is noted. IMPRESSION: Mild changes of CHF. Electronically Signed   By: Inez Catalina M.D.   On: 12/23/2021 02:01     PROCEDURES:  Critical Care performed: Yes, see critical care procedure note(s)   CRITICAL CARE Performed by: Cyril Mourning Croix Presley   Total critical care time: 45 minutes  Critical care time was exclusive of separately billable procedures and treating other patients.  Critical care was necessary to treat or prevent imminent or life-threatening deterioration.  Critical care was time spent personally by me on the following  activities: development  of treatment plan with patient and/or surrogate as well as nursing, discussions with consultants, evaluation of patient's response to treatment, examination of patient, obtaining history from patient or surrogate, ordering and performing treatments and interventions, ordering and review of laboratory studies, ordering and review of radiographic studies, pulse oximetry and re-evaluation of patient's condition.   Marland Kitchen1-3 Lead EKG Interpretation  Performed by: Rikayla Demmon, Delice Bison, DO Authorized by: Gloria Ricardo, Delice Bison, DO     Interpretation: abnormal     ECG rate:  115   ECG rate assessment: tachycardic     Rhythm: sinus tachycardia     Ectopy: none     Conduction: normal       IMPRESSION / MDM / ASSESSMENT AND PLAN / ED COURSE  I reviewed the triage vital signs and the nursing notes.    Patient here for shortness of breath, hypoxia, fever.  The patient is on the cardiac monitor to evaluate for evidence of arrhythmia and/or significant heart rate changes.   DIFFERENTIAL DIAGNOSIS (includes but not limited to):   Sepsis, bacteremia, pneumonia, COVID-19, COPD exacerbation, CHF, PE, pneumothorax, UTI   Patient's presentation is most consistent with acute presentation with potential threat to life or bodily function.   PLAN: We will obtain CBC, CMP, procalcitonin, BNP, troponin, chest x-ray, COVID swab.  We will give broad-spectrum antibiotics and start IV fluids.   MEDICATIONS GIVEN IN ED: Medications  atorvastatin (LIPITOR) tablet 40 mg (has no administration in time range)  losartan (COZAAR) tablet 100 mg (has no administration in time range)  aspirin EC tablet 81 mg (has no administration in time range)  nitroGLYCERIN (NITROSTAT) SL tablet 0.4 mg (has no administration in time range)  acetaminophen (TYLENOL) tablet 650 mg (has no administration in time range)  ondansetron (ZOFRAN) injection 4 mg (has no administration in time range)  furosemide (LASIX) injection  20 mg (has no administration in time range)  lactated ringers infusion ( Intravenous New Bag/Given 12/23/21 0500)  cefTRIAXone (ROCEPHIN) 2 g in sodium chloride 0.9 % 100 mL IVPB (has no administration in time range)  azithromycin (ZITHROMAX) 500 mg in sodium chloride 0.9 % 250 mL IVPB (0 mg Intravenous Stopped 12/23/21 0658)  heparin ADULT infusion 100 units/mL (25000 units/270m) (850 Units/hr Intravenous New Bag/Given 12/23/21 0512)  ceFEPIme (MAXIPIME) 2 g in sodium chloride 0.9 % 100 mL IVPB (0 g Intravenous Stopped 12/23/21 0211)  acetaminophen (TYLENOL) tablet 1,000 mg (1,000 mg Oral Given 12/23/21 0151)  vancomycin (VANCOREADY) IVPB 1750 mg/350 mL (0 mg Intravenous Stopped 12/23/21 0421)  furosemide (LASIX) injection 40 mg (40 mg Intravenous Given 12/23/21 0332)  ipratropium-albuterol (DUONEB) 0.5-2.5 (3) MG/3ML nebulizer solution 3 mL (3 mLs Nebulization Given 12/23/21 0321)  methylPREDNISolone sodium succinate (SOLU-MEDROL) 125 mg/2 mL injection 125 mg (125 mg Intravenous Given 12/23/21 0326)  aspirin chewable tablet 324 mg ( Oral See Alternative 12/23/21 0546)    Or  aspirin suppository 300 mg (300 mg Rectal Given 12/23/21 0546)  heparin bolus via infusion 4,000 Units (4,000 Units Intravenous Bolus from Bag 12/23/21 0512)     ED COURSE: Patient's labs show leukocytosis of 13,000.  Mildly elevated potassium of 5.3 with no EKG changes.  Chronic kidney disease which is stable.  Normal LFTs.  ABG shows slightly low PaO2.  He is improving clinically on BiPAP.  We will see if we can transition him to CPAP or high flow nasal cannula.  Checks x-ray reviewed and interpreted by myself and radiologist and shows possible CHF.  BNP is elevated in  the 400s.  First troponin 80s.  This could be from demand ischemia.  He denies to me having any chest pain.  His EKG is nonischemic.  Need admission to the hospital.   CONSULTS:  Consulted and discussed patient's case with hospitalist, Dr. Damita Dunnings.  I have recommended admission  and consulting physician agrees and will place admission orders.  Patient (and family if present) agree with this plan.   I reviewed all nursing notes, vitals, pertinent previous records.  All labs, EKGs, imaging ordered have been independently reviewed and interpreted by myself.    OUTSIDE RECORDS REVIEWED: Reviewed patient's last office visit with Earlie Counts on 11/13/2021.       FINAL CLINICAL IMPRESSION(S) / ED DIAGNOSES   Final diagnoses:  Shortness of breath  COPD exacerbation (HCC)  Acute on chronic congestive heart failure, unspecified heart failure type (HCC)  Fever, unspecified fever cause  Acute respiratory failure with hypoxia ([place])     Rx / DC Orders   ED Discharge Orders     None        Note:  This document was prepared using Dragon voice recognition software and may include unintentional dictation errors.   Eddison Searls, Delice Bison, DO 12/23/21 [phone]

## 2021-12-23 NOTE — Assessment & Plan Note (Signed)
DuoNebs as needed Will hold off on steroids due to acute infection with fever Patient got a dose of methylprednisolone in the ED

## 2021-12-23 NOTE — H&P (Signed)
History and Physical    Patient: Ruben Green GEX:[id number] DOB: 11/16/1933 DOA: 12/23/2021 DOS: the patient was seen and examined on 12/23/2021 PCP: Tracie Harrier, MD  Patient coming from: Home  Chief Complaint:  Chief Complaint  Patient presents with   Shortness of Breath    HPI: Ruben Green is a 88 y.o. male with medical history significant for CAD, COPD, HTN, prostate cancer, AAA, history of postoperative hypoxia s/p hip replacement in February 2023, hospitalized in February 2023 with sepsis secondary to pneumonia and some concern for CHF with echo showing grade 1 DD, discharged home on 3 L oxygen who presents to the ED by EMS in acute respiratory distress that started during the afternoon hours with O2 sats 88% on room air.  He had no chest pain.  Patient had a several day history of cough and congestion and fever.  O2 sat on room air with EMS was in the 70s and he was placed on NRB ED course and data review: Tmax rectal was 102.2 with tachycardia to 130 and respirations 28-35.  Patient was transitioned from NRB to BiPAP on arrival and desaturated to the 70s during transition with O2 sat improving to 96 after placement of BiPAP.  BP was elevated with systolic in the 010U. Labs: ABG on BiPAP at 14/8 FiO2 45% with pH 7.43 PCO2 33 and PO2 79. WBC 13,000 with lactic acid 1.4.  Hemoglobin 9.6 which appears to be baseline.  Creatinine 1.25, potassium 5.3 Troponin 82-1290 and BNP 415 COVID and flu negative EKG, personally reviewed and interpreted, sinus tachycardia at 130 with RBBB and no acute ST-T wave changes. Chest x-ray with mild CHF changes Patient was treated with IV Lasix, DuoNebs and Solu-Medrol, cefepime and vancomycin.  Hospitalist consulted for admission for acute respiratory failure of multifactorial etiology.    Past Medical History:  Diagnosis Date   AAA (abdominal aortic aneurysm) ([place])    a.) s/p repair in 2005   Anemia    Anginal pain ([place])    Anxiety     Arthritis    B12 deficiency    Bilateral cataracts    a.) s/p BILATERAL extractions in 2018   BPH with obstruction/lower urinary tract symptoms    CAD (coronary artery disease)    Carotid artery stenosis    a.) s/p CEA on the RIGHT   COPD (chronic obstructive pulmonary disease) (HCC)    Diastolic dysfunction 72/53/6644   a.)  TTE 07/29/2019: EF 50-55%; LA mildly enlarged; G1DD.   DOE (dyspnea on exertion)    Elevated PSA    Environmental and seasonal allergies    History of 2019 novel coronavirus disease (COVID-19)    History of kidney stones    HLD (hyperlipidemia)    HOH (hard of hearing)    HTN (hypertension)    Incomplete bladder emptying    Macular degeneration    Prostate cancer (HCC)    RBBB (right bundle branch block)    Valvular insufficiency    a.) TTE 07/29/2019: LVEF 50-55%; LA mild enlarged; triv AR/PR, mild MR, mod TR.   Past Surgical History:  Procedure Laterality Date   ABDOMINAL AORTIC ANEURYSM REPAIR     CATARACT EXTRACTION W/PHACO Right 09/25/2016   Procedure: CATARACT EXTRACTION PHACO AND INTRAOCULAR LENS PLACEMENT (IOC);  Surgeon: Birder Robson, MD;  Location: [facility] ORS;  Service: Ophthalmology;  Laterality: Right;  [place] 01:01 AP% 17.4 CDE 10.75 Fluid pack lot # [id number]   CATARACT EXTRACTION W/PHACO Left 10/16/2016   Procedure: CATARACT  EXTRACTION PHACO AND INTRAOCULAR LENS PLACEMENT (IOC) Suture placed in Left eye;  Surgeon: Birder Robson, MD;  Location: [facility] ORS;  Service: Ophthalmology;  Laterality: Left;  [place] 2:06.8 AP% 22.2 CDE 28.17 Fluid pack lot # [id number]   COLONOSCOPY WITH PROPOFOL N/A 03/31/2019   Procedure: COLONOSCOPY WITH PROPOFOL;  Surgeon: Lollie Sails, MD;  Location: [facility] ENDOSCOPY;  Service: Endoscopy;  Laterality: N/A;   COLONOSCOPY WITH PROPOFOL N/A 09/09/2020   Procedure: COLONOSCOPY WITH PROPOFOL;  Surgeon: Lesly Rubenstein, MD;  Location: [facility] ENDOSCOPY;  Service: Endoscopy;  Laterality: N/A;   CYSTOSCOPY W/ RETROGRADES  Bilateral 02/23/2020   Procedure: CYSTOSCOPY WITH RETROGRADE PYELOGRAM;  Surgeon: Abbie Sons, MD;  Location: [facility] ORS;  Service: Urology;  Laterality: Bilateral;   CYSTOSCOPY WITH BIOPSY N/A 02/23/2020   Procedure: CYSTOSCOPY WITH BIOPSY;  Surgeon: Abbie Sons, MD;  Location: [facility] ORS;  Service: Urology;  Laterality: N/A;   EYE SURGERY     HERNIA REPAIR     x 2   INGUINAL HERNIA REPAIR Right 12/02/2015   Procedure: HERNIA REPAIR INGUINAL ADULT;  Surgeon: Leonie Green, MD;  Location: [facility] ORS;  Service: General;  Laterality: Right;   PROSTATE SURGERY  2012   Right Carotid Endarterectomy     TOTAL HIP ARTHROPLASTY Right 08/01/2021   Procedure: TOTAL HIP ARTHROPLASTY;  Surgeon: Corky Mull, MD;  Location: [facility] ORS;  Service: Orthopedics;  Laterality: Right;   Social History:  reports that he quit smoking about 23 years ago. His smoking use included cigarettes. He has quit using smokeless tobacco. He reports that he does not currently use alcohol. He reports that he does not use drugs.  Allergies  Allergen Reactions   Oxycodone Other (See Comments)    Caused hallucinations/does not want anymore    Family History  Problem Relation Age of Onset   Cancer Brother        2000   Prostate cancer Neg Hx    Bladder Cancer Neg Hx    Kidney cancer Neg Hx     Prior to Admission medications   Medication Sig Start Date End Date Taking? Authorizing Provider  acetaminophen (TYLENOL) 500 MG tablet Take 1-2 tablets (500-1,000 mg total) by mouth every 6 (six) hours as needed for mild pain. 08/04/21   Lattie Corns, PA-C  apixaban (ELIQUIS) 2.5 MG TABS tablet Take 1 tablet (2.5 mg total) by mouth 2 (two) times daily. 08/04/21   Lattie Corns, PA-C  atorvastatin (LIPITOR) 40 MG tablet Take 40 mg by mouth daily.    [provider]  Fluticasone-Umeclidin-Vilant (TRELEGY ELLIPTA) 100-62.5-25 MCG/INH AEPB Inhale 2 puffs into the lungs daily. 09/01/19   [provider]   iron polysaccharides (NIFEREX) 150 MG capsule Take 150 mg by mouth daily. 03/03/20   [provider]  losartan (COZAAR) 100 MG tablet Take 100 mg by mouth daily.  09/04/16   [provider]  ondansetron (ZOFRAN) 4 MG tablet Take 1 tablet (4 mg total) by mouth every 6 (six) hours as needed for nausea. 08/04/21   Lattie Corns, PA-C  tamsulosin (FLOMAX) 0.4 MG CAPS capsule Take 1 capsule (0.4 mg total) by mouth daily. 08/04/21   Lattie Corns, PA-C    Physical Exam: Vitals:   12/23/21 0210 12/23/21 0230 12/23/21 0300 12/23/21 0343  BP:  (!) 178/70 (!) 156/91   Pulse: (!) 118 (!) 122 (!) 120 (!) 114  Resp: (!) 28 (!) 35 (!) 28 19  Temp:  TempSrc:      SpO2: 100% 99% 97% 96%  Weight:       Physical Exam Vitals and nursing note reviewed.  Constitutional:      General: He is not in acute distress.    Appearance: He is ill-appearing.  HENT:     Head: Normocephalic and atraumatic.  Cardiovascular:     Rate and Rhythm: Regular rhythm. Tachycardia present.     Heart sounds: Normal heart sounds.  Pulmonary:     Effort: Tachypnea present.     Breath sounds: Wheezing present.  Abdominal:     Palpations: Abdomen is soft.     Tenderness: There is no abdominal tenderness.  Musculoskeletal:     Right lower leg: Edema present.     Left lower leg: Edema present.  Neurological:     Mental Status: Mental status is at baseline.     Labs on Admission: I have personally reviewed following labs and imaging studies  CBC: Recent Labs  Lab 12/23/21 0140  WBC 13.2*  NEUTROABS 11.9*  HGB 9.6*  HCT 33.3*  MCV 90.7  PLT 841   Basic Metabolic Panel: Recent Labs  Lab 12/23/21 0140  NA 142  K 5.3*  CL 113*  CO2 23  GLUCOSE 164*  BUN 23  CREATININE 1.25*  CALCIUM 8.5*  MG 1.9   GFR: Estimated Creatinine Clearance: 38.2 mL/min (A) (by C-G formula based on SCr of 1.25 mg/dL (H)). Liver Function Tests: Recent Labs  Lab 12/23/21 0140  AST 28  ALT 25   ALKPHOS 73  BILITOT 0.7  PROT 6.6  ALBUMIN 3.6   No results for input(s): "LIPASE", "AMYLASE" in the last 168 hours. No results for input(s): "AMMONIA" in the last 168 hours. Coagulation Profile: Recent Labs  Lab 12/23/21 0140  INR 1.1   Cardiac Enzymes: No results for input(s): "CKTOTAL", "CKMB", "CKMBINDEX", "TROPONINI" in the last 168 hours. BNP (last 3 results) No results for input(s): "PROBNP" in the last 8760 hours. HbA1C: No results for input(s): "HGBA1C" in the last 72 hours. CBG: No results for input(s): "GLUCAP" in the last 168 hours. Lipid Profile: No results for input(s): "CHOL", "HDL", "LDLCALC", "TRIG", "CHOLHDL", "LDLDIRECT" in the last 72 hours. Thyroid Function Tests: No results for input(s): "TSH", "T4TOTAL", "FREET4", "T3FREE", "THYROIDAB" in the last 72 hours. Anemia Panel: No results for input(s): "VITAMINB12", "FOLATE", "FERRITIN", "TIBC", "IRON", "RETICCTPCT" in the last 72 hours. Urine analysis:    Component Value Date/Time   COLORURINE YELLOW (A) 08/08/2021 2102   APPEARANCEUR Clear 09/21/2021 1408   LABSPEC 1.010 08/08/2021 2102   PHURINE 5.0 08/08/2021 2102   GLUCOSEU Negative 09/21/2021 1408   HGBUR MODERATE (A) 08/08/2021 2102   BILIRUBINUR Negative 09/21/2021 [place] 08/08/2021 2102   PROTEINUR Negative 09/21/2021 1408   PROTEINUR NEGATIVE 08/08/2021 2102   NITRITE Negative 09/21/2021 1408   NITRITE NEGATIVE 08/08/2021 2102   LEUKOCYTESUR Negative 09/21/2021 1408   LEUKOCYTESUR LARGE (A) 08/08/2021 2102    Radiological Exams on Admission: DG Chest Port 1 View  Result Date: 12/23/2021 CLINICAL DATA:  Shortness of breath EXAM: PORTABLE CHEST 1 VIEW COMPARISON:  08/09/2021 FINDINGS: Cardiac shadow is within normal limits. Aortic calcifications are again seen. Lungs are well aerated bilaterally. Mild vascular congestion is noted with interstitial edema. No bony abnormality is noted. IMPRESSION: Mild changes of CHF.  Electronically Signed   By: Inez Catalina M.D.   On: 12/23/2021 02:01     Data Reviewed: Relevant notes from primary care  and specialist visits, past discharge summaries as available in EHR, including Care Everywhere. Prior diagnostic testing as pertinent to current admission diagnoses Updated medications and problem lists for reconciliation ED course, including vitals, labs, imaging, treatment and response to treatment Triage notes, nursing and pharmacy notes and ED provider's notes Notable results as noted in HPI   Assessment and Plan: * Acute respiratory failure with hypoxia (HCC) Multifactorial and related to COPD and possible respiratory tract infection as well as acute CHF, NSTEMI Continue BiPAP and wean as tolerated Suspect patient has underlying chronic respiratory failure given discharge back in February on O2 at 3 L which he was not wearing Treat acute etiologies as will be outlined  NSTEMI (non-ST elevated myocardial infarction) ([place]) CAD Troponin went from 82-1290 the patient denies chest pain and EKG nonacute Suspect demand ischemia from acute respiratory distress Echocardiogram to evaluate for wall motion abnormality Cardiology consult Continue apixaban, atorvastatin and lovastatin  Respiratory tract infection Acute bronchitis versus CAP though latter not seen on x-ray Rocephin and azithromycin Follow-up blood cultures  Acute on chronic diastolic CHF (congestive heart failure) ([place]) Echo in February 2023 with grade 1 diastolic dysfunction Elevated BNP and chest x-ray showing mild CHF changes Received Lasix in the ED.  Sepsis ([place]) Sepsis criteria include tachycardia, tachypnea, fever, leukocytosis IV fluids and monitor for worsening of suspected CHF  COPD with acute exacerbation ([place]) DuoNebs as needed Will hold off on steroids due to acute infection with fever Patient got a dose of methylprednisolone in the ED  Essential (primary) hypertension Continue  losartan  Abdominal aortic aneurysm (AAA) without rupture Acute complication not suspected at this time as patient denies pain        DVT prophylaxis: Heparin infusion  Consults: Cardiology consult, [place], Dr. Corky Sox  Advance Care Planning:   Code Status: Prior   Family Communication: wife at bedside  Disposition Plan: Back to previous home environment  Severity of Illness: The appropriate patient status for this patient is INPATIENT. Inpatient status is judged to be reasonable and necessary in order to provide the required intensity of service to ensure the patient's safety. The patient's presenting symptoms, physical exam findings, and initial radiographic and laboratory data in the context of their chronic comorbidities is felt to place them at high risk for further clinical deterioration. Furthermore, it is not anticipated that the patient will be medically stable for discharge from the hospital within 2 midnights of admission.   * I certify that at the point of admission it is my clinical judgment that the patient will require inpatient hospital care spanning beyond 2 midnights from the point of admission due to high intensity of service, high risk for further deterioration and high frequency of surveillance required.*  Author: Athena Masse, MD 12/23/2021 4:03 AM  For on call review [email or web address].

## 2021-12-23 NOTE — ED Triage Notes (Signed)
Pt BIB EMS for breathing difficulty that started this evening. Per EMS, pt was 88% on RA. Pt has hx COPD.     100.9 axillary 189/91 120

## 2021-12-23 NOTE — Progress Notes (Signed)
This is a no charge noticed patient was admitted this AM.  Patient seen and examined.   Ruben Green is a 88 y.o. male with medical history significant for CAD, COPD, HTN, prostate cancer, AAA, history of postoperative hypoxia s/p hip replacement in February 2023, hospitalized in February 2023 with sepsis secondary to pneumonia and some concern for CHF with echo showing grade 1 DD, discharged home on 3 L oxygen who presents to the ED by EMS in acute respiratory distress that started during the afternoon hours with O2 sats 88% on room air.  He had no chest pain.  Patient had a several day history of cough and congestion and fever.  O2 sat on room air with EMS was in the 70s and he was placed on NRB ED course and data review: Tmax rectal was 102.2 with tachycardia to 130 and respirations 28-35.  Patient was transitioned from NRB to BiPAP on arrival and desaturated to the 70s during transition with O2 sat improving to 96 after placement of BiPAP.  BP was elevated with systolic in the 511M. Labs: ABG on BiPAP at 14/8 FiO2 45% with pH 7.43 PCO2 33 and PO2 79. WBC 13,000 with lactic acid 1.4.  Hemoglobin 9.6 which appears to be baseline.  Creatinine 1.25, potassium 5.3 Troponin 82-1290 and BNP 415 COVID and flu negative  TP elevated. Cardiology consulted this am.  Awake , nad, non tachypnic. Hard to understand his accent, but able to speak some english with me.  Minimal scattered crackles Reg s1/s2 Soft benign  Trace edema  A/P: Continue lasix Cardiology consulted- Continue iv abx Ck am labs

## 2021-12-23 NOTE — Progress Notes (Signed)
PHARMACY -  BRIEF ANTIBIOTIC NOTE   Pharmacy has received consult(s) for Cefepime, Vancomycin from an ED provider.  The patient's profile has been reviewed for ht/wt/allergies/indication/available labs.    One time order(s) placed for Cefepime 2 gm IV X 1 and Vancomycin 1750 mg IV X 1.   Further antibiotics/pharmacy consults should be ordered by admitting physician if indicated.                       Thank you, Syndi Pua D 12/23/2021  1:54 AM

## 2021-12-23 NOTE — Progress Notes (Signed)
ANTICOAGULATION CONSULT NOTE - Initial Consult  Pharmacy Consult for Heparin  Indication: chest pain/ACS  Allergies  Allergen Reactions   Oxycodone Other (See Comments)    Caused hallucinations/does not want anymore    Patient Measurements: Weight: 77.1 kg (170 lb) Heparin Dosing Weight: 70.9 kg   Vital Signs: Temp: 98.7 F (37.1 C) (07/08 0421) Temp Source: Oral (07/08 0421) BP: 156/91 (07/08 0300) Pulse Rate: 114 (07/08 0343)  Labs: Recent Labs    12/23/21 0140 12/23/21 0342  HGB 9.6*  --   HCT 33.3*  --   PLT 220  --   APTT 28  --   LABPROT 13.8  --   INR 1.1  --   CREATININE 1.25*  --   TROPONINIHS 82* 1,290*    Estimated Creatinine Clearance: 38.2 mL/min (A) (by C-G formula based on SCr of 1.25 mg/dL (H)).   Medical History: Past Medical History:  Diagnosis Date   AAA (abdominal aortic aneurysm) ([place])    a.) s/p repair in 2005   Anemia    Anginal pain ([place])    Anxiety    Arthritis    B12 deficiency    Bilateral cataracts    a.) s/p BILATERAL extractions in 2018   BPH with obstruction/lower urinary tract symptoms    CAD (coronary artery disease)    Carotid artery stenosis    a.) s/p CEA on the RIGHT   COPD (chronic obstructive pulmonary disease) (HCC)    Diastolic dysfunction 30/12/6224   a.)  TTE 07/29/2019: EF 50-55%; LA mildly enlarged; G1DD.   DOE (dyspnea on exertion)    Elevated PSA    Environmental and seasonal allergies    History of 2019 novel coronavirus disease (COVID-19)    History of kidney stones    HLD (hyperlipidemia)    HOH (hard of hearing)    HTN (hypertension)    Incomplete bladder emptying    Macular degeneration    Prostate cancer (HCC)    RBBB (right bundle branch block)    Valvular insufficiency    a.) TTE 07/29/2019: LVEF 50-55%; LA mild enlarged; triv AR/PR, mild MR, mod TR.    Medications:  (Not in a hospital admission)   Assessment: Pharmacy consulted to dose heparin in this 88 year old male admitted with  ACS/NSTEMI.   Pt was on Eliquis 5 mg PO BID PTA.  Pt Wife states he hasn't taken Eliquis since this past April.  CrCl = 38.2 ml/min   Goal of Therapy:  Heparin level 0.3-0.7 units/ml aPTT 66 - 102 seconds Monitor platelets by anticoagulation protocol: Yes   Plan:  Will draw baseline heparin level. Will order heparin 4000 units IV X 1 bolus and start drip @ 850 units/hr.  Will check HL 8 hrs after start of drip.    Ronella Plunk D 12/23/2021,4:38 AM

## 2021-12-23 NOTE — Progress Notes (Signed)
ANTICOAGULATION CONSULT NOTE - Initial Consult  Pharmacy Consult for Heparin  Indication: chest pain/ACS  Allergies  Allergen Reactions   Oxycodone Other (See Comments)    Caused hallucinations/does not want anymore    Patient Measurements: Weight: 77.1 kg (170 lb) Heparin Dosing Weight: 70.9 kg   Vital Signs: Temp: 97.4 F (36.3 C) (07/08 1938) Temp Source: Oral (07/08 1845) BP: 133/77 (07/08 1938) Pulse Rate: 94 (07/08 1938)  Labs: Recent Labs    12/23/21 0140 12/23/21 0342 12/23/21 0851 12/23/21 1418 12/23/21 2147  HGB 9.6*  --   --   --   --   HCT 33.3*  --   --   --   --   PLT 220  --   --   --   --   APTT 28  --  72*  --   --   LABPROT 13.8  --   --   --   --   INR 1.1  --   --   --   --   HEPARINUNFRC <0.10*  --   --  0.33 0.28*  CREATININE 1.25*  --   --   --   --   TROPONINIHS 82* 1,290* 1,969* 1,357*  --      Estimated Creatinine Clearance: 38.2 mL/min (A) (by C-G formula based on SCr of 1.25 mg/dL (H)).   Medical History: Past Medical History:  Diagnosis Date   AAA (abdominal aortic aneurysm) ([place])    a.) s/p repair in 2005   Anemia    Anginal pain ([place])    Anxiety    Arthritis    B12 deficiency    Bilateral cataracts    a.) s/p BILATERAL extractions in 2018   BPH with obstruction/lower urinary tract symptoms    CAD (coronary artery disease)    Carotid artery stenosis    a.) s/p CEA on the RIGHT   COPD (chronic obstructive pulmonary disease) (HCC)    Diastolic dysfunction 10/93/2355   a.)  TTE 07/29/2019: EF 50-55%; LA mildly enlarged; G1DD.   DOE (dyspnea on exertion)    Elevated PSA    Environmental and seasonal allergies    History of 2019 novel coronavirus disease (COVID-19)    History of kidney stones    HLD (hyperlipidemia)    HOH (hard of hearing)    HTN (hypertension)    Incomplete bladder emptying    Macular degeneration    Prostate cancer (HCC)    RBBB (right bundle branch block)    Valvular insufficiency    a.) TTE  07/29/2019: LVEF 50-55%; LA mild enlarged; triv AR/PR, mild MR, mod TR.    Medications:  Medications Prior to Admission  Medication Sig Dispense Refill Last Dose   acetaminophen (TYLENOL) 500 MG tablet Take 1-2 tablets (500-1,000 mg total) by mouth every 6 (six) hours as needed for mild pain. 60 tablet 0    Fluticasone-Umeclidin-Vilant (TRELEGY ELLIPTA) 100-62.5-25 MCG/INH AEPB Inhale 2 puffs into the lungs daily.   12/22/2021 at 2300   iron polysaccharides (NIFEREX) 150 MG capsule Take 150 mg by mouth 2 (two) times daily.   unknown at unknown   LORazepam (ATIVAN) 0.5 MG tablet Take 0.5 mg by mouth daily as needed for anxiety.   prn at prn   losartan (COZAAR) 100 MG tablet Take 100 mg by mouth daily.    12/22/2021 at 2300   tamsulosin (FLOMAX) 0.4 MG CAPS capsule Take 1 capsule (0.4 mg total) by mouth daily. 30 capsule 0 12/22/2021 at  1100   apixaban (ELIQUIS) 2.5 MG TABS tablet Take 1 tablet (2.5 mg total) by mouth 2 (two) times daily. (Patient not taking: Reported on 12/23/2021) 30 tablet 0 Not Taking   atorvastatin (LIPITOR) 40 MG tablet Take 40 mg by mouth daily. (Patient not taking: Reported on 12/23/2021)   Not Taking   iron polysaccharides (NIFEREX) 150 MG capsule Take 150 mg by mouth daily. (Patient not taking: Reported on 12/23/2021)   Not Taking   ondansetron (ZOFRAN) 4 MG tablet Take 1 tablet (4 mg total) by mouth every 6 (six) hours as needed for nausea. (Patient not taking: Reported on 12/23/2021) 30 tablet 0 Not Taking    Assessment: Pharmacy consulted to dose heparin in this 88 year old male admitted with ACS/NSTEMI.   Pt was on Eliquis 5 mg PO BID PTA.  Pt Wife states he hasn't taken Eliquis since this past April.  CrCl = 38.2 ml/min   Goal of Therapy:  Heparin level 0.3-0.7 units/ml aPTT 66 - 102 seconds Monitor platelets by anticoagulation protocol: Yes   Plan:  7/8:  HL @ 2147 = 0.28, SUBtherapeutic Will order heparin 1000 units IV X 1 bolus and increase drip rate to 1000  units/hr. Will recheck HL 8 hrs after rate change on 7/9 @ 0700.   Dreden Rivere D 12/23/2021,10:51 PM

## 2021-12-23 NOTE — Assessment & Plan Note (Signed)
Echo in February 2023 with grade 1 diastolic dysfunction Elevated BNP and chest x-ray showing mild CHF changes Received Lasix in the ED.

## 2021-12-23 NOTE — Assessment & Plan Note (Addendum)
CAD Troponin went from 82-1290 the patient denies chest pain and EKG nonacute Suspect demand ischemia from acute respiratory distress Echocardiogram to evaluate for wall motion abnormality Cardiology consult Continue apixaban, atorvastatin and lovastatin

## 2021-12-23 NOTE — Progress Notes (Signed)
CODE SEPSIS - PHARMACY COMMUNICATION  **Broad Spectrum Antibiotics should be administered within 1 hour of Sepsis diagnosis**  Time Code Sepsis Called/Page Received: 7/8 @ 0138   Antibiotics Ordered:  Vanc, Cefepime   Time of 1st antibiotic administration:  Cefepime 2 gm IV X 1 on 7/8 @ 0151  Additional action taken by pharmacy:   If necessary, Name of Provider/Nurse Contacted:     Chane Cowden D ,PharmD Clinical Pharmacist  12/23/2021  1:54 AM

## 2021-12-23 NOTE — Assessment & Plan Note (Signed)
Acute complication not suspected at this time as patient denies pain

## 2021-12-23 NOTE — Assessment & Plan Note (Addendum)
Sepsis criteria include tachycardia, tachypnea, fever, leukocytosis IV fluids and monitor for worsening of suspected CHF

## 2021-12-23 NOTE — Consult Note (Signed)
[facility] Cardiology  CARDIOLOGY CONSULT NOTE  Patient ID: Ruben Green MRN: [id number] DOB/AGE: [birth year] 88 y.o.  Admit date: 12/23/2021 Referring Physician Nolberto Hanlon Primary Physician Tracie Harrier, MD Primary Cardiologist Serafina Royals Reason for Consultation Elevated troponin, sepsis  HPI:  Ruben Green is an 88 year old male with history of carotid stenosis s/p right CEA 2021, AAA s/p repair 2005, coronary atherosclerosis (reported cardiac cath several years ago), hypertension who presents with shortness of breath, cough, fever.  Cardiology is consulted for elevated troponin.  Patient is hard of hearing and speaks [place] primarily, so history is difficult to obtain from him.  He quickly falls back asleep during interview.  His wife had to leave prior to me being able to interview the patient and his family members because she needed to work.  History is obtained from the chart primarily.  Portably he had several days of cough, congestion, fever, followed by respiratory distress prompting his presentation.  On arrival to the emergency department the patient was febrile to 102.2 F, heart rate 130, blood pressure 134/60.  He was hypoxic on room air to the 70s requiring nonrebreather this and stabilized on 12 L of high flow nasal cannula.  Labs are notable for a white blood cell count of 13.2 with a neutrophil predominance, hemoglobin 9.6 (recent baseline approximately 7), creatinine 1.25 (~ BL).  Troponin 82 --> 1290. EKG with sinus tachycardia and right bundle branch block.  Review of systems complete and found to be negative unless listed above     Past Medical History:  Diagnosis Date   AAA (abdominal aortic aneurysm) ([place])    a.) s/p repair in 2005   Anemia    Anginal pain ([place])    Anxiety    Arthritis    B12 deficiency    Bilateral cataracts    a.) s/p BILATERAL extractions in 2018   BPH with obstruction/lower urinary tract symptoms    CAD (coronary artery disease)     Carotid artery stenosis    a.) s/p CEA on the RIGHT   COPD (chronic obstructive pulmonary disease) ([place])    Diastolic dysfunction 42/59/5638   a.)  TTE 07/29/2019: EF 50-55%; LA mildly enlarged; G1DD.   DOE (dyspnea on exertion)    Elevated PSA    Environmental and seasonal allergies    History of 2019 novel coronavirus disease (COVID-19)    History of kidney stones    HLD (hyperlipidemia)    HOH (hard of hearing)    HTN (hypertension)    Incomplete bladder emptying    Macular degeneration    Prostate cancer (HCC)    RBBB (right bundle branch block)    Valvular insufficiency    a.) TTE 07/29/2019: LVEF 50-55%; LA mild enlarged; triv AR/PR, mild MR, mod TR.    Past Surgical History:  Procedure Laterality Date   ABDOMINAL AORTIC ANEURYSM REPAIR     CATARACT EXTRACTION W/PHACO Right 09/25/2016   Procedure: CATARACT EXTRACTION PHACO AND INTRAOCULAR LENS PLACEMENT (IOC);  Surgeon: Birder Robson, MD;  Location: [facility] ORS;  Service: Ophthalmology;  Laterality: Right;  [place] 01:01 AP% 17.4 CDE 10.75 Fluid pack lot # [id number]   CATARACT EXTRACTION W/PHACO Left 10/16/2016   Procedure: CATARACT EXTRACTION PHACO AND INTRAOCULAR LENS PLACEMENT ([place]) Suture placed in Left eye;  Surgeon: Birder Robson, MD;  Location: [facility] ORS;  Service: Ophthalmology;  Laterality: Left;  [place] 2:06.8 AP% 22.2 CDE 28.17 Fluid pack lot # [id number]   COLONOSCOPY WITH PROPOFOL N/A 03/31/2019   Procedure: COLONOSCOPY WITH  PROPOFOL;  Surgeon: Lollie Sails, MD;  Location: [facility] ENDOSCOPY;  Service: Endoscopy;  Laterality: N/A;   COLONOSCOPY WITH PROPOFOL N/A 09/09/2020   Procedure: COLONOSCOPY WITH PROPOFOL;  Surgeon: Lesly Rubenstein, MD;  Location: [facility] ENDOSCOPY;  Service: Endoscopy;  Laterality: N/A;   CYSTOSCOPY W/ RETROGRADES Bilateral 02/23/2020   Procedure: CYSTOSCOPY WITH RETROGRADE PYELOGRAM;  Surgeon: Abbie Sons, MD;  Location: [facility] ORS;  Service: Urology;  Laterality: Bilateral;   CYSTOSCOPY  WITH BIOPSY N/A 02/23/2020   Procedure: CYSTOSCOPY WITH BIOPSY;  Surgeon: Abbie Sons, MD;  Location: [facility] ORS;  Service: Urology;  Laterality: N/A;   EYE SURGERY     HERNIA REPAIR     x 2   INGUINAL HERNIA REPAIR Right 12/02/2015   Procedure: HERNIA REPAIR INGUINAL ADULT;  Surgeon: Leonie Green, MD;  Location: [facility] ORS;  Service: General;  Laterality: Right;   PROSTATE SURGERY  2012   Right Carotid Endarterectomy     TOTAL HIP ARTHROPLASTY Right 08/01/2021   Procedure: TOTAL HIP ARTHROPLASTY;  Surgeon: Corky Mull, MD;  Location: [facility] ORS;  Service: Orthopedics;  Laterality: Right;    (Not in a hospital admission)  Social History   Socioeconomic History   Marital status: Married    Spouse name: Not on file   Number of children: Not on file   Years of education: Not on file   Highest education level: Not on file  Occupational History   Not on file  Tobacco Use   Smoking status: Former    Types: Cigarettes    Quit date: 11/30/1998    Years since quitting: 23.0   Smokeless tobacco: Former   Tobacco comments:    quit 10 years ago  Vaping Use   Vaping Use: Never used  Substance and Sexual Activity   Alcohol use: Not Currently    Comment: occasional   Drug use: No   Sexual activity: Not on file  Other Topics Concern   Not on file  Social History Narrative   Not on file   Social Determinants of Health   Financial Resource Strain: Not on file  Food Insecurity: Not on file  Transportation Needs: Not on file  Physical Activity: Not on file  Stress: Not on file  Social Connections: Not on file  Intimate Partner Violence: Not on file    Family History  Problem Relation Age of Onset   Cancer Brother        2000   Prostate cancer Neg Hx    Bladder Cancer Neg Hx    Kidney cancer Neg Hx       Review of systems complete and found to be negative unless listed above      PHYSICAL EXAM  General: Well developed, well nourished, in no acute distress HEENT:   Normocephalic and atramatic Neck:  No JVD.  Lungs: Clear bilaterally to auscultation and percussion. Heart: HRRR . Normal S1 and S2 without gallops or murmurs.  Abdomen: Bowel sounds are positive, abdomen soft and non-tender  Msk:  Back normal, normal gait. Normal strength and tone for age. Extremities: No clubbing, cyanosis or edema.   Neuro: Alert and oriented X 3. Psych:  Good affect, responds appropriately  Labs:   Lab Results  Component Value Date   WBC 13.2 (H) 12/23/2021   HGB 9.6 (L) 12/23/2021   HCT 33.3 (L) 12/23/2021   MCV 90.7 12/23/2021   PLT 220 12/23/2021    Recent Labs  Lab 12/23/21 0140  NA  142  K 5.3*  CL 113*  CO2 23  BUN 23  CREATININE 1.25*  CALCIUM 8.5*  PROT 6.6  BILITOT 0.7  ALKPHOS 73  ALT 25  AST 28  GLUCOSE 164*   Lab Results  Component Value Date   TROPONINI <0.03 10/19/2016   No results found for: "CHOL" No results found for: "HDL" No results found for: "LDLCALC" No results found for: "TRIG" No results found for: "CHOLHDL" No results found for: "LDLDIRECT"    Radiology: [facility] Chest Port 1 View  Result Date: 12/23/2021 CLINICAL DATA:  Shortness of breath EXAM: PORTABLE CHEST 1 VIEW COMPARISON:  08/09/2021 FINDINGS: Cardiac shadow is within normal limits. Aortic calcifications are again seen. Lungs are well aerated bilaterally. Mild vascular congestion is noted with interstitial edema. No bony abnormality is noted. IMPRESSION: Mild changes of CHF. Electronically Signed   By: Inez Catalina M.D.   On: 12/23/2021 02:01    EKG: Sinus tachycardia. RBBB.   ASSESSMENT AND PLAN:  Ruben Green is an 88 year old male with history of carotid stenosis s/p right CEA 2021, AAA s/p repair 2005, coronary atherosclerosis (reported cardiac cath several years ago), hypertension who presents with shortness of breath, cough, fever.  Cardiology is consulted for elevated troponin.  # Elevated troponin # Sepsis from likely pneumonia Need to obtain further  history, but it seems that patient has had cough and fever for a few days followed by respiratory distress on 12/22/2021 prompting his presentation.  In the setting he was discovered to be febrile and tachycardic.  Troponin increased from 82-12 90 on repeat without reported chest pain.  He is very hypoxic on 12 L high flow nasal cannula, with mild pulmonary infiltrates on chest x-ray out of proportion to level of hypoxia.  I suspect his troponin elevation is demand in the setting of pneumonia, though it is impossible to rule out acute coronary syndrome at this time.  EKG shows right bundle branch block without obvious acute ischemic changes. -Continue to treat underlying infection as you are doing - s/p aspirin 300 rectal; 81 mg indefinitely - Continue heparin infusion -Trend troponin until peak -Monitor patient on telemetry while inpatient - Echocardiogram complete -Pending clinical course we will determine need for ischemic evaluation and timing.  Signed: Andrez Grime MD 12/23/2021, 8:35 AM

## 2021-12-23 NOTE — Assessment & Plan Note (Signed)
Continue losartan. 

## 2021-12-23 NOTE — Assessment & Plan Note (Addendum)
Multifactorial and related to COPD and possible respiratory tract infection as well as acute CHF, NSTEMI Continue BiPAP and wean as tolerated Suspect patient has underlying chronic respiratory failure given discharge back in February on O2 at 3 L which he was not wearing Treat acute etiologies as will be outlined

## 2021-12-24 ENCOUNTER — Inpatient Hospital Stay
Admit: 2021-12-24 | Discharge: 2021-12-24 | Disposition: A | Discharge status: Home / Self Care | Payer: PPO | Attending: Internal Medicine | Admitting: Internal Medicine

## 2021-12-24 DIAGNOSIS — J9601 Acute respiratory failure with hypoxia: Secondary | ICD-10-CM | POA: Diagnosis not present

## 2021-12-24 DIAGNOSIS — R0602 Shortness of breath: Secondary | ICD-10-CM

## 2021-12-24 LAB — ECHOCARDIOGRAM COMPLETE
AR max vel: 2.3 cm2
AV Peak grad: 6 mmHg
Ao pk vel: 1.22 m/s
Area-P 1/2: 5.27 cm2
S' Lateral: 4.42 cm
Weight: 2720 oz

## 2021-12-24 LAB — HEPARIN LEVEL (UNFRACTIONATED)
Heparin Unfractionated: 0.41 IU/mL (ref 0.30–0.70)
Heparin Unfractionated: 0.27 IU/mL — ABNORMAL LOW (ref 0.30–0.70)

## 2021-12-24 LAB — URINE CULTURE: Culture: 100 — AB

## 2021-12-24 LAB — CBC
HCT: 26.8 % — ABNORMAL LOW (ref 39.0–52.0)
Hemoglobin: 8 g/dL — ABNORMAL LOW (ref 13.0–17.0)
MCH: 26.4 pg (ref 26.0–34.0)
MCHC: 29.9 g/dL — ABNORMAL LOW (ref 30.0–36.0)
MCV: 88.4 fL (ref 80.0–100.0)
Platelets: 185 10*3/uL (ref 150–400)
RBC: 3.03 MIL/uL — ABNORMAL LOW (ref 4.22–5.81)
RDW: 17.3 % — ABNORMAL HIGH (ref 11.5–15.5)
WBC: 12.2 10*3/uL — ABNORMAL HIGH (ref 4.0–10.5)
nRBC: 0 % (ref 0.0–0.2)

## 2021-12-24 LAB — BASIC METABOLIC PANEL
Anion gap: 9 (ref 5–15)
BUN: 37 mg/dL — ABNORMAL HIGH (ref 8–23)
CO2: 22 mmol/L (ref 22–32)
Calcium: 8.2 mg/dL — ABNORMAL LOW (ref 8.9–10.3)
Chloride: 105 mmol/L (ref 98–111)
Creatinine, Ser: 1.63 mg/dL — ABNORMAL HIGH (ref 0.61–1.24)
GFR, Estimated: 41 mL/min — ABNORMAL LOW (ref >60)
Glucose, Bld: 134 mg/dL — ABNORMAL HIGH (ref 70–99)
Potassium: 4.7 mmol/L (ref 3.5–5.1)
Sodium: 136 mmol/L (ref 135–145)

## 2021-12-24 MED ORDER — GUAIFENESIN ER 600 MG PO TB12
600.0000 mg | ORAL_TABLET | Freq: Two times a day (BID) | ORAL | Status: DC
  Administered 2021-12-24 – 2021-12-28 (×9): 600 mg via ORAL
  Filled 2021-12-24 (×9): qty 1

## 2021-12-24 MED ORDER — TAMSULOSIN HCL 0.4 MG PO CAPS
0.4000 mg | ORAL_CAPSULE | Freq: Every day | ORAL | Status: DC
  Administered 2021-12-24 – 2021-12-28 (×5): 0.4 mg via ORAL
  Filled 2021-12-24 (×5): qty 1

## 2021-12-24 MED ORDER — METHYLPREDNISOLONE SODIUM SUCC 40 MG IJ SOLR
40.0000 mg | Freq: Two times a day (BID) | INTRAMUSCULAR | Status: DC
  Administered 2021-12-24 – 2021-12-26 (×5): 40 mg via INTRAVENOUS
  Filled 2021-12-24 (×5): qty 1

## 2021-12-24 MED ORDER — PERFLUTREN LIPID MICROSPHERE
1.0000 mL | INTRAVENOUS | Status: AC | PRN
  Administered 2021-12-24: 3 mL via INTRAVENOUS

## 2021-12-24 MED ORDER — IPRATROPIUM-ALBUTEROL 0.5-2.5 (3) MG/3ML IN SOLN
3.0000 mL | RESPIRATORY_TRACT | Status: DC | PRN
Start: 2021-12-24 — End: 2021-12-28
  Administered 2021-12-24 – 2021-12-28 (×3): 3 mL via RESPIRATORY_TRACT
  Filled 2021-12-24 (×3): qty 3

## 2021-12-24 MED ORDER — LORAZEPAM 0.5 MG PO TABS: 0.5000 mg | ORAL_TABLET | Freq: Every day | ORAL | Status: DC | PRN

## 2021-12-24 MED ORDER — HEPARIN BOLUS VIA INFUSION
1000.0000 [IU] | Freq: Once | INTRAVENOUS | Status: AC
Start: 2021-12-24 — End: 2021-12-24
  Administered 2021-12-24: 1000 [IU] via INTRAVENOUS
  Filled 2021-12-24: qty 1000

## 2021-12-24 NOTE — Progress Notes (Signed)
PROGRESS NOTE    Ruben Green  PIR:[id number] DOB: March 10, 1934 DOA: 12/23/2021 PCP: Tracie Harrier, MD    Brief Narrative:  Ruben Green is a 88 y.o. male with medical history significant for CAD, COPD, HTN, prostate cancer, AAA, history of postoperative hypoxia s/p hip replacement in February 2023, hospitalized in February 2023 with sepsis secondary to pneumonia and some concern for CHF with echo showing grade 1 DD, discharged home on 3 L oxygen who presents to the ED by EMS in acute respiratory distress that started during the afternoon hours with O2 sats 88% on room air.  He had no chest pain.  Patient had a several day history of cough and congestion and fever.  O2 sat on room air with EMS was in the 70s and he was placed on NRB ED course and data review: Tmax rectal was 102.2 with tachycardia to 130 and respirations 28-35.  Patient was transitioned from NRB to BiPAP on arrival and desaturated to the 70s during transition with O2 sat improving to 96 after placement of BiPAP.  BP was elevated with systolic in the 630Z. Labs: ABG on BiPAP at 14/8 FiO2 45% with pH 7.43 PCO2 33 and PO2 79. WBC 13,000 with lactic acid 1.4.  Hemoglobin 9.6 which appears to be baseline.  Creatinine 1.25, potassium 5.3 Troponin 82-1290 and BNP 415 COVID and flu negative Chest x-ray with mild CHF changes Patient was treated with IV Lasix, DuoNebs and Solu-Medrol, cefepime and vancomycin.   7/9 02 weaned to 8-9 L [place] with 02 sat 92%. Does become sob with conversation. Lasix held. Creatinine up      Consultants:  Cardiology  Procedures:   Antimicrobials:  Ceftriaxone Azithromycin Vancomycin x1   Subjective: Still sob, little congestion. No cp.asking when is he going home. Explained to pt about his 02 requirement.  Objective: Vitals:   12/23/21 2317 12/24/21 0505 12/24/21 0731 12/24/21 0830  BP: 138/60 (!) 141/77 131/85   Pulse: 86 86 78   Resp: (!) 23 20    Temp: 97.9 F (36.6  C) (!) 97.5 F (36.4 C)    TempSrc: Oral Oral    SpO2: 100% 98% 98% 92%  Weight:        Intake/Output Summary (Last 24 hours) at 12/24/2021 1155 Last data filed at 12/24/2021 0900 Gross per 24 hour  Intake 986.04 ml  Output 1450 ml  Net -463.96 ml   Filed Weights   12/23/21 0133  Weight: 77.1 kg    Examination: Calm, NAD Mild rhonchi/coarse bs, minimal exp. Wheezing, no crackles Reg s1/s2 no gallopno jvd Soft benign +bs No edema Awake and alert. Grossly intact Mood and affect appropriate in current setting     Data Reviewed: I have personally reviewed following labs and imaging studies  CBC: Recent Labs  Lab 12/23/21 0140 12/24/21 0526  WBC 13.2* 12.2*  NEUTROABS 11.9*  --   HGB 9.6* 8.0*  HCT 33.3* 26.8*  MCV 90.7 88.4  PLT 220 601   Basic Metabolic Panel: Recent Labs  Lab 12/23/21 0140 12/24/21 0526  NA 142 136  K 5.3* 4.7  CL 113* 105  CO2 23 22  GLUCOSE 164* 134*  BUN 23 37*  CREATININE 1.25* 1.63*  CALCIUM 8.5* 8.2*  MG 1.9  --    GFR: Estimated Creatinine Clearance: 29.3 mL/min (A) (by C-G formula based on SCr of 1.63 mg/dL (H)). Liver Function Tests: Recent Labs  Lab 12/23/21 0140  AST 28  ALT 25  ALKPHOS 73  BILITOT 0.7  PROT 6.6  ALBUMIN 3.6   No results for input(s): "LIPASE", "AMYLASE" in the last 168 hours. No results for input(s): "AMMONIA" in the last 168 hours. Coagulation Profile: Recent Labs  Lab 12/23/21 0140  INR 1.1   Cardiac Enzymes: No results for input(s): "CKTOTAL", "CKMB", "CKMBINDEX", "TROPONINI" in the last 168 hours. BNP (last 3 results) No results for input(s): "PROBNP" in the last 8760 hours. HbA1C: No results for input(s): "HGBA1C" in the last 72 hours. CBG: No results for input(s): "GLUCAP" in the last 168 hours. Lipid Profile: No results for input(s): "CHOL", "HDL", "LDLCALC", "TRIG", "CHOLHDL", "LDLDIRECT" in the last 72 hours. Thyroid Function Tests: No results for input(s): "TSH", "T4TOTAL",  "FREET4", "T3FREE", "THYROIDAB" in the last 72 hours. Anemia Panel: No results for input(s): "VITAMINB12", "FOLATE", "FERRITIN", "TIBC", "IRON", "RETICCTPCT" in the last 72 hours. Sepsis Labs: Recent Labs  Lab 12/23/21 0140 12/23/21 1418  PROCALCITON  --  4.86  LATICACIDVEN 1.4  --     Recent Results (from the past 240 hour(s))  Resp Panel by RT-PCR (Flu A&B, Covid) Anterior Nasal Swab     Status: None   Collection Time: 12/23/21  1:40 AM   Specimen: Anterior Nasal Swab  Result Value Ref Range Status   SARS Coronavirus 2 by RT PCR NEGATIVE NEGATIVE Final    Comment: (NOTE) SARS-CoV-2 target nucleic acids are NOT DETECTED.  The SARS-CoV-2 RNA is generally detectable in upper respiratory specimens during the acute phase of infection. The lowest concentration of SARS-CoV-2 viral copies this assay can detect is 138 copies/mL. A negative result does not preclude SARS-Cov-2 infection and should not be used as the sole basis for treatment or other patient management decisions. A negative result may occur with  improper specimen collection/handling, submission of specimen other than nasopharyngeal swab, presence of viral mutation(s) within the areas targeted by this assay, and inadequate number of viral copies(<138 copies/mL). A negative result must be combined with clinical observations, patient history, and epidemiological information. The expected result is Negative.  Fact Sheet for Patients:  EntrepreneurPulse.com.au  Fact Sheet for Healthcare Providers:  IncredibleEmployment.be  This test is no t yet approved or cleared by the [place] FDA and  has been authorized for detection and/or diagnosis of SARS-CoV-2 by FDA under an Emergency Use Authorization (EUA). This EUA will remain  in effect (meaning this test can be used) for the duration of the COVID-19 declaration under Section 564(b)(1) of the Act, 21 U.S.C.section 360bbb-3(b)(1),  unless the authorization is terminated  or revoked sooner.       Influenza A by PCR NEGATIVE NEGATIVE Final   Influenza B by PCR NEGATIVE NEGATIVE Final    Comment: (NOTE) The Xpert Xpress SARS-CoV-2/FLU/RSV plus assay is intended as an aid in the diagnosis of influenza from Nasopharyngeal swab specimens and should not be used as a sole basis for treatment. Nasal washings and aspirates are unacceptable for Xpert Xpress SARS-CoV-2/FLU/RSV testing.  Fact Sheet for Patients: EntrepreneurPulse.com.au  Fact Sheet for Healthcare Providers: IncredibleEmployment.be  This test is not yet approved or cleared by the [place] FDA and has been authorized for detection and/or diagnosis of SARS-CoV-2 by FDA under an Emergency Use Authorization (EUA). This EUA will remain in effect (meaning this test can be used) for the duration of the COVID-19 declaration under Section 564(b)(1) of the Act, 21 U.S.C. section 360bbb-3(b)(1), unless the authorization is terminated or revoked.  Performed at [facility], [address] [place],  [place] [address]   Blood Culture (routine x 2)     Status: None (Preliminary result)   Collection Time: 12/23/21  1:40 AM   Specimen: BLOOD  Result Value Ref Range Status   Specimen Description BLOOD RIGHT ANTECUBITAL  Final   Special Requests   Final    BOTTLES DRAWN AEROBIC AND ANAEROBIC Blood Culture results may not be optimal due to an excessive volume of blood received in culture bottles   Culture   Final    NO GROWTH 1 DAY Performed at [facility], [address] [place], [place] [address]    Report Status PENDING  Incomplete  Blood Culture (routine x 2)     Status: None (Preliminary result)   Collection Time: 12/23/21  1:40 AM   Specimen: BLOOD  Result Value Ref Range Status   Specimen Description BLOOD LEFT ANTECUBITAL  Final   Special Requests   Final    BOTTLES DRAWN AEROBIC AND ANAEROBIC  Blood Culture results may not be optimal due to an excessive volume of blood received in culture bottles   Culture   Final    NO GROWTH 1 DAY Performed at [facility], [address], [place], [place] [address]    Report Status PENDING  Incomplete  Urine Culture     Status: None (Preliminary result)   Collection Time: 12/23/21  4:44 AM   Specimen: In/Out Cath Urine  Result Value Ref Range Status   Specimen Description   Final    IN/OUT CATH URINE Performed at [facility], [address], [place], [place] [address]    Special Requests   Final    NONE Performed at [facility], [address] [place], [place] [address]    Culture   Final    CULTURE REINCUBATED FOR BETTER GROWTH Performed at [facility], [place] [address] [place], [place] [address]    Report Status PENDING  Incomplete         Radiology Studies: DG Chest Port 1 View  Result Date: 12/23/2021 CLINICAL DATA:  Shortness of breath EXAM: PORTABLE CHEST 1 VIEW COMPARISON:  08/09/2021 FINDINGS: Cardiac shadow is within normal limits. Aortic calcifications are again seen. Lungs are well aerated bilaterally. Mild vascular congestion is noted with interstitial edema. No bony abnormality is noted. IMPRESSION: Mild changes of CHF. Electronically Signed   By: Inez Catalina M.D.   On: 12/23/2021 02:01        Scheduled Meds:  aspirin EC  81 mg Oral Daily   atorvastatin  40 mg Oral Daily   guaiFENesin  600 mg Oral BID   methylPREDNISolone (SOLU-MEDROL) injection  40 mg Intravenous Q12H   tamsulosin  0.4 mg Oral Daily   Continuous Infusions:  azithromycin 500 mg (12/24/21 0318)   cefTRIAXone (ROCEPHIN)  IV Stopped (12/24/21 0232)   heparin 1,000 Units/hr (12/24/21 9024)    Assessment & Plan:   Principal Problem:   Acute respiratory failure with hypoxia (HCC) Active Problems:   Sepsis ([place])   Acute on chronic diastolic CHF (congestive heart failure) (HCC)   Respiratory tract  infection   COPD with acute exacerbation (HCC)   Essential (primary) hypertension   Abdominal aortic aneurysm (AAA) without rupture   Coronary artery disease involving native coronary artery of native heart without angina pectoris   Elevated troponin   Acute respiratory failure with hypoxia (HCC) Multifactorial and related to COPD and possible respiratory tract infection as well as acute CHF Suspect patient has underlying  chronic respiratory failure given discharge back in February on O2 at 3 L which he was not wearing 7/9 weaned from bipap to 8L Still sob .  Will add steroid iv as has some wheezing Add mucinex Hold lasix as creatinine up Echo pending PCCm consult Continue iv abx     Elevated Troponin CAD Due to demand ischemia, hard to r/o ACS at this time. TP trending down Echo pending Cards consulted, input appreciated. Continue heparin infusion, asa '81mg'$  indefinitely Consider beta blk if respiratory status improves and can tolerate Tele    Respiratory tract infection Acute bronchitis versus CAP Procalcitonin elevated Continue iv abx F/u cx.     Acute on chronic diastolic CHF (congestive heart failure) ([place]) Echo in February 2023 with grade 1 diastolic dysfunction Elevated BNP and chest x-ray showing mild CHF changes Cards was consulted. 7/9 hold lasix as creatinine up.  I/o, daily wt   Sepsis ([place]) Likely due to respiratory infection Treat with ivabx  AKI on CKD stage IIIa Creatinine up with diuretics Monitor Hold nephrotoxic meds   COPD with acute exacerbation (HCC)  Start steroids as has some wheezing on exam Still sob.  Tx as above   Essential (primary) hypertension Bp stable Hold losartan due to elevated creatinine   Abdominal aortic aneurysm (AAA) without rupture Acute complication not suspected at this time as patient denies pain   DVT prophylaxis: Heparin Code Status: Full Family Communication: Son at bedside Disposition Plan: To be  determined, PT OT consult Status is: Inpatient Remains inpatient appropriate because: IV treatment        LOS: 1 day   Time spent: 52 minutes with more than 50% on [place], MD [facility] Pager [phone]  If 7PM-7AM, please contact night-coverage 12/24/2021, 11:55 AM

## 2021-12-24 NOTE — TOC Initial Note (Signed)
Transition of Care [facility]) - Initial/Assessment Note    Patient Details  Name: Ruben Green MRN: [id number] Date of Birth: 1933-10-09  Transition of Care [facility]) CM/SW Contact:    Magnus Ivan, LCSW Phone Number: 12/24/2021, 10:54 AM  Clinical Narrative:       Completed high risk assessment with son Iona Beard who is at bedside. Patient lives with his wife who provides transportation. PCP is Dr. Ginette Pitman. Pharmacy is [facility].            No DME or SNF history. Patient had [facility] in the past.  TOC to follow for needs.    Expected Discharge Plan: Home/Self Care Barriers to Discharge: Continued Medical Work up   Patient Goals and CMS Choice Patient states their goals for this hospitalization and ongoing recovery are:: to return home CMS Medicare.gov Compare Post Acute Care list provided to:: Patient Choice offered to / list presented to : Patient  Expected Discharge Plan and Services Expected Discharge Plan: Home/Self Care       Living arrangements for the past 2 months: Single Family Home                                      Prior Living Arrangements/Services Living arrangements for the past 2 months: Single Family Home Lives with:: Spouse Patient language and need for interpreter reviewed:: Yes Do you feel safe going back to the place where you live?: Yes      Need for Family Participation in Patient Care: Yes (Comment) Care giver support system in place?: Yes (comment)   Criminal Activity/Legal Involvement Pertinent to Current Situation/Hospitalization: No - Comment as needed  Activities of Daily Living Home Assistive Devices/Equipment: Hearing aid ADL Screening (condition at time of admission) Patient's cognitive ability adequate to safely complete daily activities?: Yes Is the patient deaf or have difficulty hearing?: Yes Does the patient have difficulty seeing, even when wearing glasses/contacts?: No Does the patient have difficulty  concentrating, remembering, or making decisions?: No Patient able to express need for assistance with ADLs?: Yes Does the patient have difficulty dressing or bathing?: Yes Independently performs ADLs?: Yes (appropriate for developmental age) Does the patient have difficulty walking or climbing stairs?: Yes Weakness of Legs: None Weakness of Arms/Hands: None  Permission Sought/Granted Permission sought to share information with : Facility Art therapist granted to share information with : Yes, Verbal Permission Granted              Emotional Assessment         Alcohol / Substance Use: Not Applicable Psych Involvement: No (comment)  Admission diagnosis:  Shortness of breath [R06.02] CHF (congestive heart failure) (HCC) [I50.9] COPD exacerbation (HCC) [J44.1] Acute respiratory failure with hypoxia ([place]) [J96.01] Sepsis ([place]) [A41.9] Fever, unspecified fever cause [R50.9] Acute on chronic congestive heart failure, unspecified heart failure type ([place]) [I50.9] Patient Active Problem List   Diagnosis Date Noted   Sepsis ([place]) 12/23/2021   Acute on chronic diastolic CHF (congestive heart failure) ([place]) 12/23/2021   Respiratory tract infection 12/23/2021   NSTEMI (non-ST elevated myocardial infarction) ([place]) 12/23/2021   AKI (acute kidney injury) ([place]) 08/08/2021   Hyponatremia 08/08/2021   UTI (urinary tract infection) 08/08/2021   Anemia 08/08/2021   Bilateral pneumonia 08/08/2021   Severe sepsis (HCC) 08/08/2021   Acute respiratory failure with hypoxia ([place]) 08/08/2021   Bilateral pleural effusion 08/08/2021   Elevated troponin  08/08/2021   On continuous oral anticoagulation 08/08/2021   Status post right hip replacement 08/01/21 08/04/2021   Status post total hip replacement, right 08/01/2021   Carcinoma in situ of bladder 03/05/2020   Prostate cancer ([place]) 07/03/2017   Endophthalmitis, acute, right 02/23/2017   Coronary artery disease involving native  coronary artery of native heart without angina pectoris 05/30/2016   Benign fibroma of prostate 11/08/2015   COPD with acute exacerbation ([place]) 11/08/2015   Essential (primary) hypertension 11/08/2015   Combined fat and carbohydrate induced hyperlipemia 11/08/2015   Low serum cobalamin 04/05/2015   Elevated PSA 02/08/2015   BPH with obstruction/lower urinary tract symptoms 02/08/2015   Erectile dysfunction of organic origin 02/08/2015   Anxiety 08/24/2014   Abdominal aortic aneurysm (AAA) without rupture 05/20/2014   Carotid artery plaque 05/20/2014   Breathlessness on exertion 05/20/2014   Aneurysm of abdominal vessel ([place]) 05/20/2014   Anxiety, generalized 04/20/2014   Benign prostatic hyperplasia with urinary obstruction 10/27/2012   PCP:  Tracie Harrier, MD Pharmacy:   [place], [address] [place] [address] Phone: [phone] Fax: [phone]  [facility] [id number], [address] AT [address] [place] [address] Phone: [phone] Fax: [phone]     Social Determinants of Health (SDOH) Interventions    Readmission Risk Interventions     No data to display

## 2021-12-24 NOTE — Progress Notes (Signed)
ANTICOAGULATION CONSULT NOTE - Initial Consult  Pharmacy Consult for Heparin  Indication: chest pain/ACS  Allergies  Allergen Reactions   Oxycodone Other (See Comments)    Caused hallucinations/does not want anymore    Patient Measurements: Weight: 77.1 kg (170 lb) Heparin Dosing Weight: 70.9 kg   Vital Signs: Temp: 97.5 F (36.4 C) (07/09 0505) Temp Source: Oral (07/09 0505) BP: 131/85 (07/09 0731) Pulse Rate: 78 (07/09 0731)  Labs: Recent Labs    12/23/21 0140 12/23/21 0342 12/23/21 0851 12/23/21 1418 12/23/21 2147 12/24/21 0526 12/24/21 0655  HGB 9.6*  --   --   --   --  8.0*  --   HCT 33.3*  --   --   --   --  26.8*  --   PLT 220  --   --   --   --  185  --   APTT 28  --  72*  --   --   --   --   LABPROT 13.8  --   --   --   --   --   --   INR 1.1  --   --   --   --   --   --   HEPARINUNFRC <0.10*  --   --  0.33 0.28*  --  0.41  CREATININE 1.25*  --   --   --   --  1.63*  --   TROPONINIHS 82* 1,290* 1,969* 1,357*  --   --   --      Estimated Creatinine Clearance: 29.3 mL/min (A) (by C-G formula based on SCr of 1.63 mg/dL (H)).   Medical History: Past Medical History:  Diagnosis Date   AAA (abdominal aortic aneurysm) ([place])    a.) s/p repair in 2005   Anemia    Anginal pain ([place])    Anxiety    Arthritis    B12 deficiency    Bilateral cataracts    a.) s/p BILATERAL extractions in 2018   BPH with obstruction/lower urinary tract symptoms    CAD (coronary artery disease)    Carotid artery stenosis    a.) s/p CEA on the RIGHT   COPD (chronic obstructive pulmonary disease) (HCC)    Diastolic dysfunction 47/02/6282   a.)  TTE 07/29/2019: EF 50-55%; LA mildly enlarged; G1DD.   DOE (dyspnea on exertion)    Elevated PSA    Environmental and seasonal allergies    History of 2019 novel coronavirus disease (COVID-19)    History of kidney stones    HLD (hyperlipidemia)    HOH (hard of hearing)    HTN (hypertension)    Incomplete bladder emptying     Macular degeneration    Prostate cancer (HCC)    RBBB (right bundle branch block)    Valvular insufficiency    a.) TTE 07/29/2019: LVEF 50-55%; LA mild enlarged; triv AR/PR, mild MR, mod TR.    Medications:  Medications Prior to Admission  Medication Sig Dispense Refill Last Dose   acetaminophen (TYLENOL) 500 MG tablet Take 1-2 tablets (500-1,000 mg total) by mouth every 6 (six) hours as needed for mild pain. 60 tablet 0    Fluticasone-Umeclidin-Vilant (TRELEGY ELLIPTA) 100-62.5-25 MCG/INH AEPB Inhale 2 puffs into the lungs daily.   12/22/2021 at 2300   iron polysaccharides (NIFEREX) 150 MG capsule Take 150 mg by mouth 2 (two) times daily.   unknown at unknown   LORazepam (ATIVAN) 0.5 MG tablet Take 0.5 mg by mouth daily as needed  for anxiety.   prn at prn   losartan (COZAAR) 100 MG tablet Take 100 mg by mouth daily.    12/22/2021 at 2300   tamsulosin (FLOMAX) 0.4 MG CAPS capsule Take 1 capsule (0.4 mg total) by mouth daily. 30 capsule 0 12/22/2021 at 1100   apixaban (ELIQUIS) 2.5 MG TABS tablet Take 1 tablet (2.5 mg total) by mouth 2 (two) times daily. (Patient not taking: Reported on 12/23/2021) 30 tablet 0 Not Taking   atorvastatin (LIPITOR) 40 MG tablet Take 40 mg by mouth daily. (Patient not taking: Reported on 12/23/2021)   Not Taking   iron polysaccharides (NIFEREX) 150 MG capsule Take 150 mg by mouth daily. (Patient not taking: Reported on 12/23/2021)   Not Taking   ondansetron (ZOFRAN) 4 MG tablet Take 1 tablet (4 mg total) by mouth every 6 (six) hours as needed for nausea. (Patient not taking: Reported on 12/23/2021) 30 tablet 0 Not Taking    Assessment: Pharmacy consulted to dose heparin in this 88 year old male admitted with ACS/NSTEMI.   Pt was on Eliquis 5 mg PO BID PTA.  Pt Wife states he hasn't taken Eliquis since this past April.  CrCl = 38.2 ml/min   Goal of Therapy:  Heparin level 0.3-0.7 units/ml aPTT 66 - 102 seconds Monitor platelets by anticoagulation protocol: Yes    Date/time HL Rate: 7/8'@1418'$  0.33 Thera x1'@850'$  units/hr 7/8'@2147'$  0.28 Subtherapeutic'@850'$  units/hr  Plan:  7/9'@0655'$ : HL 0.41, therapeutic x 1 Continue heparin infusion rate at 1000 units/hr. Will check confirmatory HL in 8 hrs  CBC daily  Shamari Lofquist A Toren Tucholski 12/24/2021,8:01 AM

## 2021-12-24 NOTE — Progress Notes (Signed)
*  PRELIMINARY RESULTS* Echocardiogram 2D Echocardiogram has been performed. Definity IV ultrasound imaging agent used on this study.  Claretta Fraise 12/24/2021, 12:35 PM

## 2021-12-24 NOTE — TOC CM/SW Note (Signed)
Attempted call to son Iona Beard for assessment. Left VM.  Oleh Genin, [place]

## 2021-12-24 NOTE — Consult Note (Signed)
[facility] Cardiology  CARDIOLOGY CONSULT NOTE  Patient ID: Ruben Green MRN: [id number] DOB/AGE: 86-27-1935 86 y.o.  Admit date: 12/23/2021 Referring Physician Nolberto Hanlon Primary Physician Tracie Harrier, MD Primary Cardiologist Serafina Royals Reason for Consultation Elevated troponin, sepsis  HPI:  Ruben Green is an 88 year old male with history of carotid stenosis s/p right CEA 2021, AAA s/p repair 2005, coronary atherosclerosis (reported cardiac cath several years ago), hypertension who presents with shortness of breath, cough, fever.  Cardiology is consulted for elevated troponin.  Interval history: - Oxygen requirement remains elevated to 12L --> 8 L - Renal function worsened with lasix administration - States he breathing is "good." Denies chest pain.  - Son is present today to help provide information and translate.   Review of systems complete and found to be negative unless listed above     Past Medical History:  Diagnosis Date   AAA (abdominal aortic aneurysm) ([place])    a.) s/p repair in 2005   Anemia    Anginal pain ([place])    Anxiety    Arthritis    B12 deficiency    Bilateral cataracts    a.) s/p BILATERAL extractions in 2018   BPH with obstruction/lower urinary tract symptoms    CAD (coronary artery disease)    Carotid artery stenosis    a.) s/p CEA on the RIGHT   COPD (chronic obstructive pulmonary disease) ([place])    Diastolic dysfunction 18/29/9371   a.)  TTE 07/29/2019: EF 50-55%; LA mildly enlarged; G1DD.   DOE (dyspnea on exertion)    Elevated PSA    Environmental and seasonal allergies    History of 2019 novel coronavirus disease (COVID-19)    History of kidney stones    HLD (hyperlipidemia)    HOH (hard of hearing)    HTN (hypertension)    Incomplete bladder emptying    Macular degeneration    Prostate cancer (HCC)    RBBB (right bundle branch block)    Valvular insufficiency    a.) TTE 07/29/2019: LVEF 50-55%; LA mild enlarged; triv  AR/PR, mild MR, mod TR.    Past Surgical History:  Procedure Laterality Date   ABDOMINAL AORTIC ANEURYSM REPAIR     CATARACT EXTRACTION W/PHACO Right 09/25/2016   Procedure: CATARACT EXTRACTION PHACO AND INTRAOCULAR LENS PLACEMENT (IOC);  Surgeon: Birder Robson, MD;  Location: [facility] ORS;  Service: Ophthalmology;  Laterality: Right;  [place] 01:01 AP% 17.4 CDE 10.75 Fluid pack lot # [id number]   CATARACT EXTRACTION W/PHACO Left 10/16/2016   Procedure: CATARACT EXTRACTION PHACO AND INTRAOCULAR LENS PLACEMENT ([place]) Suture placed in Left eye;  Surgeon: Birder Robson, MD;  Location: [facility] ORS;  Service: Ophthalmology;  Laterality: Left;  [place] 2:06.8 AP% 22.2 CDE 28.17 Fluid pack lot # [id number]   COLONOSCOPY WITH PROPOFOL N/A 03/31/2019   Procedure: COLONOSCOPY WITH PROPOFOL;  Surgeon: Lollie Sails, MD;  Location: [facility] ENDOSCOPY;  Service: Endoscopy;  Laterality: N/A;   COLONOSCOPY WITH PROPOFOL N/A 09/09/2020   Procedure: COLONOSCOPY WITH PROPOFOL;  Surgeon: Lesly Rubenstein, MD;  Location: [facility] ENDOSCOPY;  Service: Endoscopy;  Laterality: N/A;   CYSTOSCOPY W/ RETROGRADES Bilateral 02/23/2020   Procedure: CYSTOSCOPY WITH RETROGRADE PYELOGRAM;  Surgeon: Abbie Sons, MD;  Location: [facility] ORS;  Service: Urology;  Laterality: Bilateral;   CYSTOSCOPY WITH BIOPSY N/A 02/23/2020   Procedure: CYSTOSCOPY WITH BIOPSY;  Surgeon: Abbie Sons, MD;  Location: [facility] ORS;  Service: Urology;  Laterality: N/A;   EYE SURGERY     HERNIA REPAIR  x 2   INGUINAL HERNIA REPAIR Right 12/02/2015   Procedure: HERNIA REPAIR INGUINAL ADULT;  Surgeon: Leonie Green, MD;  Location: [facility] ORS;  Service: General;  Laterality: Right;   PROSTATE SURGERY  2012   Right Carotid Endarterectomy     TOTAL HIP ARTHROPLASTY Right 08/01/2021   Procedure: TOTAL HIP ARTHROPLASTY;  Surgeon: Corky Mull, MD;  Location: [facility] ORS;  Service: Orthopedics;  Laterality: Right;    Medications Prior to Admission  Medication Sig  Dispense Refill Last Dose   acetaminophen (TYLENOL) 500 MG tablet Take 1-2 tablets (500-1,000 mg total) by mouth every 6 (six) hours as needed for mild pain. 60 tablet 0    Fluticasone-Umeclidin-Vilant (TRELEGY ELLIPTA) 100-62.5-25 MCG/INH AEPB Inhale 2 puffs into the lungs daily.   12/22/2021 at 2300   iron polysaccharides (NIFEREX) 150 MG capsule Take 150 mg by mouth 2 (two) times daily.   unknown at unknown   LORazepam (ATIVAN) 0.5 MG tablet Take 0.5 mg by mouth daily as needed for anxiety.   prn at prn   losartan (COZAAR) 100 MG tablet Take 100 mg by mouth daily.    12/22/2021 at 2300   tamsulosin (FLOMAX) 0.4 MG CAPS capsule Take 1 capsule (0.4 mg total) by mouth daily. 30 capsule 0 12/22/2021 at 1100   apixaban (ELIQUIS) 2.5 MG TABS tablet Take 1 tablet (2.5 mg total) by mouth 2 (two) times daily. (Patient not taking: Reported on 12/23/2021) 30 tablet 0 Not Taking   atorvastatin (LIPITOR) 40 MG tablet Take 40 mg by mouth daily. (Patient not taking: Reported on 12/23/2021)   Not Taking   iron polysaccharides (NIFEREX) 150 MG capsule Take 150 mg by mouth daily. (Patient not taking: Reported on 12/23/2021)   Not Taking   ondansetron (ZOFRAN) 4 MG tablet Take 1 tablet (4 mg total) by mouth every 6 (six) hours as needed for nausea. (Patient not taking: Reported on 12/23/2021) 30 tablet 0 Not Taking    Social History   Socioeconomic History   Marital status: Married    Spouse name: Not on file   Number of children: Not on file   Years of education: Not on file   Highest education level: Not on file  Occupational History   Not on file  Tobacco Use   Smoking status: Former    Types: Cigarettes    Quit date: 11/30/1998    Years since quitting: 23.0   Smokeless tobacco: Former   Tobacco comments:    quit 10 years ago  Vaping Use   Vaping Use: Never used  Substance and Sexual Activity   Alcohol use: Not Currently    Comment: occasional   Drug use: No   Sexual activity: Not on file  Other Topics  Concern   Not on file  Social History Narrative   Not on file   Social Determinants of Health   Financial Resource Strain: Not on file  Food Insecurity: Not on file  Transportation Needs: Not on file  Physical Activity: Not on file  Stress: Not on file  Social Connections: Not on file  Intimate Partner Violence: Not on file    Family History  Problem Relation Age of Onset   Cancer Brother        2000   Prostate cancer Neg Hx    Bladder Cancer Neg Hx    Kidney cancer Neg Hx       Review of systems complete and found to be negative unless listed above  PHYSICAL EXAM  General: Well developed. [place] oxygen in place. Increased WOB.  HEENT:  Normocephalic and atramatic Neck:  No JVD.  Lungs: Crackles in BL bases.  Heart: HRRR . Normal S1 and S2 without gallops or murmurs.  Abdomen: Bowel sounds are positive, abdomen soft and non-tender  Msk:  Back normal, normal gait. Normal strength and tone for age. Extremities: No clubbing, cyanosis or edema.   Neuro: Alert and oriented X 3. Psych:  Good affect, responds appropriately  Labs:   Lab Results  Component Value Date   WBC 12.2 (H) 12/24/2021   HGB 8.0 (L) 12/24/2021   HCT 26.8 (L) 12/24/2021   MCV 88.4 12/24/2021   PLT 185 12/24/2021    Recent Labs  Lab 12/23/21 0140 12/24/21 0526  NA 142 136  K 5.3* 4.7  CL 113* 105  CO2 23 22  BUN 23 37*  CREATININE 1.25* 1.63*  CALCIUM 8.5* 8.2*  PROT 6.6  --   BILITOT 0.7  --   ALKPHOS 73  --   ALT 25  --   AST 28  --   GLUCOSE 164* 134*    Lab Results  Component Value Date   TROPONINI <0.03 10/19/2016   No results found for: "CHOL" No results found for: "HDL" No results found for: "LDLCALC" No results found for: "TRIG" No results found for: "CHOLHDL" No results found for: "LDLDIRECT"    Radiology: [facility] An Chest Port 1 View  Result Date: 12/23/2021 CLINICAL DATA:  Shortness of breath EXAM: PORTABLE CHEST 1 VIEW COMPARISON:  08/09/2021 FINDINGS: Cardiac shadow  is within normal limits. Aortic calcifications are again seen. Lungs are well aerated bilaterally. Mild vascular congestion is noted with interstitial edema. No bony abnormality is noted. IMPRESSION: Mild changes of CHF. Electronically Signed   By: Inez Catalina M.D.   On: 12/23/2021 02:01    EKG: Sinus tachycardia. RBBB.   ASSESSMENT AND PLAN:  Ruben Green is an 88 year old male with history of carotid stenosis s/p right CEA 2021, AAA s/p repair 2005, coronary atherosclerosis (reported cardiac cath several years ago), hypertension who presents with shortness of breath, cough, fever.  Cardiology is consulted for elevated troponin.  # Elevated troponin # Sepsis from likely pneumonia Patient has had cough and fever for a few days followed by respiratory distress on 12/22/2021 prompting his presentation.  In the setting he was discovered to be febrile and tachycardic.  Troponin increased to peak of 1969 without reported chest pain.  He is hypoxic on 12 L high flow nasal cannula, with mild pulmonary infiltrates on chest x-ray out of proportion to level of hypoxia.  I suspect his troponin elevation is demand in the setting of pneumonia, though it is impossible to rule out acute coronary syndrome at this time.  EKG shows right bundle branch block without obvious acute ischemic changes. -Continue to treat underlying infection as you are doing - Continue 81 mg indefinitely - Continue heparin infusion - Holding lasix this morning in setting of worsening renal function. - Will consider addition of beta blocker as infection improves -Monitor patient on telemetry while inpatient - Echocardiogram to be completed this morning.  -Pending clinical course we will determine need for ischemic evaluation and timing. Renal function is worsening, actively infected, and chronic severe anemia are all relative contraindications at this time.   Signed: Andrez Grime MD 12/24/2021, 8:19 AM

## 2021-12-24 NOTE — Progress Notes (Signed)
ANTICOAGULATION CONSULT NOTE - Initial Consult  Pharmacy Consult for Heparin  Indication: chest pain/ACS  Allergies  Allergen Reactions   Oxycodone Other (See Comments)    Caused hallucinations/does not want anymore    Patient Measurements: Weight: 77.1 kg (170 lb) Heparin Dosing Weight: 70.9 kg   Vital Signs: Temp: 97.5 F (36.4 C) (07/09 0505) Temp Source: Oral (07/09 0505) BP: 131/85 (07/09 0731) Pulse Rate: 78 (07/09 0731)  Labs: Recent Labs    12/23/21 0140 12/23/21 0342 12/23/21 0851 12/23/21 1418 12/23/21 2147 12/24/21 0526 12/24/21 0655 12/24/21 1455  HGB 9.6*  --   --   --   --  8.0*  --   --   HCT 33.3*  --   --   --   --  26.8*  --   --   PLT 220  --   --   --   --  185  --   --   APTT 28  --  72*  --   --   --   --   --   LABPROT 13.8  --   --   --   --   --   --   --   INR 1.1  --   --   --   --   --   --   --   HEPARINUNFRC <0.10*  --   --  0.33 0.28*  --  0.41 0.27*  CREATININE 1.25*  --   --   --   --  1.63*  --   --   TROPONINIHS 82* 1,290* 1,969* 1,357*  --   --   --   --      Estimated Creatinine Clearance: 29.3 mL/min (A) (by C-G formula based on SCr of 1.63 mg/dL (H)).   Medical History: Past Medical History:  Diagnosis Date   AAA (abdominal aortic aneurysm) ([place])    a.) s/p repair in 2005   Anemia    Anginal pain ([place])    Anxiety    Arthritis    B12 deficiency    Bilateral cataracts    a.) s/p BILATERAL extractions in 2018   BPH with obstruction/lower urinary tract symptoms    CAD (coronary artery disease)    Carotid artery stenosis    a.) s/p CEA on the RIGHT   COPD (chronic obstructive pulmonary disease) (HCC)    Diastolic dysfunction 82/95/6213   a.)  TTE 07/29/2019: EF 50-55%; LA mildly enlarged; G1DD.   DOE (dyspnea on exertion)    Elevated PSA    Environmental and seasonal allergies    History of 2019 novel coronavirus disease (COVID-19)    History of kidney stones    HLD (hyperlipidemia)    HOH (hard of hearing)     HTN (hypertension)    Incomplete bladder emptying    Macular degeneration    Prostate cancer (HCC)    RBBB (right bundle branch block)    Valvular insufficiency    a.) TTE 07/29/2019: LVEF 50-55%; LA mild enlarged; triv AR/PR, mild MR, mod TR.    Medications:  Medications Prior to Admission  Medication Sig Dispense Refill Last Dose   acetaminophen (TYLENOL) 500 MG tablet Take 1-2 tablets (500-1,000 mg total) by mouth every 6 (six) hours as needed for mild pain. 60 tablet 0    Fluticasone-Umeclidin-Vilant (TRELEGY ELLIPTA) 100-62.5-25 MCG/INH AEPB Inhale 2 puffs into the lungs daily.   12/22/2021 at 2300   iron polysaccharides (NIFEREX) 150 MG capsule Take 150  mg by mouth 2 (two) times daily.   unknown at unknown   LORazepam (ATIVAN) 0.5 MG tablet Take 0.5 mg by mouth daily as needed for anxiety.   prn at prn   losartan (COZAAR) 100 MG tablet Take 100 mg by mouth daily.    12/22/2021 at 2300   tamsulosin (FLOMAX) 0.4 MG CAPS capsule Take 1 capsule (0.4 mg total) by mouth daily. 30 capsule 0 12/22/2021 at 1100   apixaban (ELIQUIS) 2.5 MG TABS tablet Take 1 tablet (2.5 mg total) by mouth 2 (two) times daily. (Patient not taking: Reported on 12/23/2021) 30 tablet 0 Not Taking   atorvastatin (LIPITOR) 40 MG tablet Take 40 mg by mouth daily. (Patient not taking: Reported on 12/23/2021)   Not Taking   iron polysaccharides (NIFEREX) 150 MG capsule Take 150 mg by mouth daily. (Patient not taking: Reported on 12/23/2021)   Not Taking   ondansetron (ZOFRAN) 4 MG tablet Take 1 tablet (4 mg total) by mouth every 6 (six) hours as needed for nausea. (Patient not taking: Reported on 12/23/2021) 30 tablet 0 Not Taking    Assessment: Pharmacy consulted to dose heparin in this 88 year old male admitted with ACS/NSTEMI.   Pt was on Eliquis 5 mg PO BID PTA.  Pt Wife states he hasn't taken Eliquis since this past April.  CrCl = 38.2 ml/min   Goal of Therapy:  Heparin level 0.3-0.7 units/ml aPTT 66 - 102 seconds Monitor  platelets by anticoagulation protocol: Yes   Date Time HL Rate/Comment 7/08 0140 <0.10 7/08 1418 0.33 Therapeutic x1 7/08 2147 0.28 Subtherapeutic 7/9 0655 0.41 Therapeutic x1 7/9 1455 0.27 Subtherapeutic  Plan:  HL subtherapeutic Give heparin 1000 units IV bolus x1 then increase hparin infusion rate to 1150 units/hr. Will check confirmatory HL in 8 hrs  CBC daily  Darrick Penna 12/24/2021,4:09 PM

## 2021-12-25 ENCOUNTER — Inpatient Hospital Stay: Payer: PPO

## 2021-12-25 ENCOUNTER — Other Ambulatory Visit: Payer: PPO | Admitting: Urology

## 2021-12-25 DIAGNOSIS — J9601 Acute respiratory failure with hypoxia: Secondary | ICD-10-CM | POA: Diagnosis not present

## 2021-12-25 LAB — C-REACTIVE PROTEIN: CRP: 18.2 mg/dL — ABNORMAL HIGH (ref <1.0)

## 2021-12-25 LAB — BASIC METABOLIC PANEL
Anion gap: 10 (ref 5–15)
BUN: 48 mg/dL — ABNORMAL HIGH (ref 8–23)
CO2: 21 mmol/L — ABNORMAL LOW (ref 22–32)
Calcium: 8.1 mg/dL — ABNORMAL LOW (ref 8.9–10.3)
Chloride: 105 mmol/L (ref 98–111)
Creatinine, Ser: 1.5 mg/dL — ABNORMAL HIGH (ref 0.61–1.24)
GFR, Estimated: 45 mL/min — ABNORMAL LOW (ref >60)
Glucose, Bld: 168 mg/dL — ABNORMAL HIGH (ref 70–99)
Potassium: 4.9 mmol/L (ref 3.5–5.1)
Sodium: 136 mmol/L (ref 135–145)

## 2021-12-25 LAB — CBC
HCT: 26 % — ABNORMAL LOW (ref 39.0–52.0)
Hemoglobin: 8 g/dL — ABNORMAL LOW (ref 13.0–17.0)
MCH: 26.7 pg (ref 26.0–34.0)
MCHC: 30.8 g/dL (ref 30.0–36.0)
MCV: 86.7 fL (ref 80.0–100.0)
Platelets: 195 10*3/uL (ref 150–400)
RBC: 3 MIL/uL — ABNORMAL LOW (ref 4.22–5.81)
RDW: 16.9 % — ABNORMAL HIGH (ref 11.5–15.5)
WBC: 7.8 10*3/uL (ref 4.0–10.5)
nRBC: 0 % (ref 0.0–0.2)

## 2021-12-25 LAB — LIPOPROTEIN A (LPA): Lipoprotein (a): 108.6 nmol/L — ABNORMAL HIGH (ref <75.0)

## 2021-12-25 LAB — HEPARIN LEVEL (UNFRACTIONATED)
Heparin Unfractionated: 0.4 IU/mL (ref 0.30–0.70)
Heparin Unfractionated: 0.46 IU/mL (ref 0.30–0.70)

## 2021-12-25 MED ORDER — METOPROLOL SUCCINATE ER 25 MG PO TB24
25.0000 mg | ORAL_TABLET | Freq: Every day | ORAL | Status: DC
  Administered 2021-12-26: 25 mg via ORAL
  Filled 2021-12-25: qty 1

## 2021-12-25 MED ORDER — AMLODIPINE BESYLATE 10 MG PO TABS
10.0000 mg | ORAL_TABLET | Freq: Every day | ORAL | Status: DC
  Administered 2021-12-26 – 2021-12-28 (×3): 10 mg via ORAL
  Filled 2021-12-25 (×3): qty 1

## 2021-12-25 MED ORDER — METOPROLOL SUCCINATE ER 25 MG PO TB24
12.5000 mg | ORAL_TABLET | Freq: Every day | ORAL | Status: DC
  Administered 2021-12-25: 12.5 mg via ORAL
  Filled 2021-12-25: qty 1

## 2021-12-25 MED ORDER — AMLODIPINE BESYLATE 5 MG PO TABS
5.0000 mg | ORAL_TABLET | Freq: Once | ORAL | Status: AC
  Administered 2021-12-25: 5 mg via ORAL
  Filled 2021-12-25: qty 1

## 2021-12-25 MED ORDER — METOPROLOL SUCCINATE ER 25 MG PO TB24
12.5000 mg | ORAL_TABLET | Freq: Once | ORAL | Status: AC
  Administered 2021-12-25: 12.5 mg via ORAL
  Filled 2021-12-25: qty 1

## 2021-12-25 MED ORDER — HYDRALAZINE HCL 20 MG/ML IJ SOLN
10.0000 mg | Freq: Four times a day (QID) | INTRAMUSCULAR | Status: DC | PRN
Start: 2021-12-25 — End: 2021-12-28
  Administered 2021-12-27 – 2021-12-28 (×2): 10 mg via INTRAVENOUS
  Filled 2021-12-25 (×2): qty 1

## 2021-12-25 MED ORDER — POLYSACCHARIDE IRON COMPLEX 150 MG PO CAPS
150.0000 mg | ORAL_CAPSULE | Freq: Two times a day (BID) | ORAL | Status: DC
  Administered 2021-12-25 – 2021-12-28 (×7): 150 mg via ORAL
  Filled 2021-12-25 (×7): qty 1

## 2021-12-25 MED ORDER — AMLODIPINE BESYLATE 5 MG PO TABS
5.0000 mg | ORAL_TABLET | Freq: Every day | ORAL | Status: DC
  Administered 2021-12-25: 5 mg via ORAL
  Filled 2021-12-25: qty 1

## 2021-12-25 NOTE — Evaluation (Signed)
Occupational Therapy Evaluation Patient Details Name: Ruben Green MRN: [id number] DOB: 08-03-1933 Today's Date: 12/25/2021   History of Present Illness Ruben Green is a 88 y.o. male with medical history significant for CAD, COPD, HTN, prostate cancer, AAA, history of postoperative hypoxia s/p hip replacement in February 2023, hospitalized in February 2023 with sepsis secondary to pneumonia and some concern for CHF with echo showing grade 1 DD, discharged home on 3 L oxygen who presents to the ED by EMS in acute respiratory distress that started during the afternoon hours with O2 sats 88% on room air.   Clinical Impression   Ruben Green was seen for OT evaluation this date. Prior to hospital admission, pt was Independent. Pt lives with spouse in home c 2 STE. Pt presents to acute OT demonstrating impaired ADL performance and functional mobility 2/2 decreased activity tolerance. Pt currently requires  MIN A urinal use in standing, pt requires BUE support, wife assists with urinal, wide BOS noted. MIN A don/doff socks in sitting - wife assits. SBA for ADL t/f, no AD use, assist to manage O2 tank. Pt would benefit from skilled OT to address noted impairments and functional limitations (see below for any additional details). Upon hospital discharge, recommend HHOT to maximize pt safety and return to PLOF.    Recommendations for follow up therapy are one component of a multi-disciplinary discharge planning process, led by the attending physician.  Recommendations may be updated based on patient status, additional functional criteria and insurance authorization.   Follow Up Recommendations  Home health OT    Assistance Recommended at Discharge Set up Supervision/Assistance  Patient can return home with the following A little help with walking and/or transfers;A little help with bathing/dressing/bathroom    Functional Status Assessment  Patient has had a recent decline in their  functional status and demonstrates the ability to make significant improvements in function in a reasonable and predictable amount of time.  Equipment Recommendations  None recommended by OT    Recommendations for Other Services       Precautions / Restrictions Precautions Precautions: Fall Restrictions Weight Bearing Restrictions: No      Mobility Bed Mobility Overal bed mobility: Needs Assistance Bed Mobility: Supine to Sit     Supine to sit: Supervision          Transfers Overall transfer level: Needs assistance Equipment used: None Transfers: Sit to/from Stand Sit to Stand: Supervision                  Balance Overall balance assessment: Needs assistance Sitting-balance support: Feet supported, No upper extremity supported Sitting balance-Leahy Scale: Good     Standing balance support: Bilateral upper extremity supported Standing balance-Leahy Scale: Fair                             ADL either performed or assessed with clinical judgement   ADL Overall ADL's : Needs assistance/impaired                                       General ADL Comments: MIN A urinal use in standing, pt requires BUE support, wife assists with urinal, wide BOS noted. MIN A don/doff socks in sitting - wife assits. SBA for ADL t/f, no AD use, assist to manage O2 tank      Pertinent Vitals/Pain Pain Assessment Pain  Assessment: No/denies pain     Hand Dominance     Extremity/Trunk Assessment Upper Extremity Assessment Upper Extremity Assessment: Overall WFL for tasks assessed   Lower Extremity Assessment Lower Extremity Assessment: Generalized weakness       Communication Communication Communication: HOH;Prefers language other than English (refused interpreter, wife interprets as needed)   Cognition Arousal/Alertness: Awake/alert Behavior During Therapy: WFL for tasks assessed/performed Overall Cognitive Status: Within Functional Limits  for tasks assessed                                       General Comments  SpO2 94% on 3L [place] in standing     Home Living Family/patient expects to be discharged to:: Private residence Living Arrangements: Spouse/significant other Available Help at Discharge: Family Type of Home: House Home Access: Stairs to enter Technical brewer of Steps: 2                   Home Equipment: Conservation officer, nature (2 wheels)          Prior Functioning/Environment Prior Level of Function : Independent/Modified Independent             Mobility Comments: RW during previous THA, has not used recently          OT Problem List: Decreased strength;Decreased activity tolerance;Impaired balance (sitting and/or standing)      OT Treatment/Interventions: Self-care/ADL training;Therapeutic exercise;Energy conservation;DME and/or AE instruction;Therapeutic activities;Balance training;Patient/family education    OT Goals(Current goals can be found in the care plan section) Acute Rehab OT Goals Patient Stated Goal: to go home OT Goal Formulation: With patient/family Time For Goal Achievement: 01/08/22 Potential to Achieve Goals: Good ADL Goals Pt Will Perform Grooming: Independently;standing Pt Will Perform Lower Body Dressing: Independently;sit to/from stand Pt Will Transfer to Toilet: Independently;ambulating;regular height toilet  OT Frequency: Min 2X/week    Co-evaluation              AM-PAC OT "6 Clicks" Daily Activity     Outcome Measure Help from another person eating meals?: None Help from another person taking care of personal grooming?: A Little Help from another person toileting, which includes using toliet, bedpan, or urinal?: A Little Help from another person bathing (including washing, rinsing, drying)?: A Little Help from another person to put on and taking off regular upper body clothing?: None Help from another person to put on and taking off  regular lower body clothing?: A Little 6 Click Score: 20   End of Session    Activity Tolerance: Patient tolerated treatment well Patient left:  (walking with PT)  OT Visit Diagnosis: Unsteadiness on feet (R26.81)                Time: 1435-1455 OT Time Calculation (min): 20 min Charges:  OT General Charges $OT Visit: 1 Visit OT Evaluation $OT Eval Moderate Complexity: 1 Mod  Dessie Coma, M.S. OTR/L  12/25/21, 4:30 PM  ascom [phone]

## 2021-12-25 NOTE — Consult Note (Signed)
PULMONOLOGY         Date: 12/25/2021,   MRN# [id number] Ruben Green November 17, 1933     AdmissionWeight: 77.1 kg                 CurrentWeight: 73.9 kg  Referring provider: Dr Kurtis Bushman    CHIEF COMPLAINT:   Acute on chronic hypoxemic respiratory failure   HISTORY OF PRESENT ILLNESS   This is an 88 yo male with history of CAD, COPD, HTN, prostate cancer, AAA, history, hip replacement 07/2021, s/p CAP/AECHF in 07/2021 hospitalized at that time , TTE with HFpEF, recent onset of hypoxemia chronically requiring supplemental O2 at low level 1-3L/min [place]. He was noted to be hypoxemic on arrival to ED and required BIPAP.  Patient was febrile at the time with SIRS criteria met including tachycardia and tachypnea.  He had an arterial blood gas performed with significant hypoxemia on FiO2 45% however pH was within reference range.  He does have chronic anemia and renal impairment.  BNP was moderately elevated at 415 on arrival.  Empirically was treated with broad-spectrum antibiotics with cefepime and vancomycin as well as steroids utilizing IV Solu-Medrol.  He was diuresed in the ER with Lasix intravenously.  He was admitted with acute hypoxemic respiratory failure with multi focal pneumonia.  Cardiology consultation was placed for additional evaluation due to significant cardiac history.  Pulmonary consultation for severe recurrent hypoxemic failure and rehospitalization.  His CAT scan from February 2023 was reviewed by me with findings of significant centrilobular emphysema as well as a moderate volume hiatal hernia suspicious for recurrent aspiration.  At that time he also had consolidation of the right lower lobe and bilateral mild pleural effusion with associated atelectasis.  PAST MEDICAL HISTORY   Past Medical History:  Diagnosis Date   AAA (abdominal aortic aneurysm) ([place])    a.) s/p repair in 2005   Anemia    Anginal pain ([place])    Anxiety    Arthritis    B12 deficiency     Bilateral cataracts    a.) s/p BILATERAL extractions in 2018   BPH with obstruction/lower urinary tract symptoms    CAD (coronary artery disease)    Carotid artery stenosis    a.) s/p CEA on the RIGHT   COPD (chronic obstructive pulmonary disease) ([place])    Diastolic dysfunction 15/61/5379   a.)  TTE 07/29/2019: EF 50-55%; LA mildly enlarged; G1DD.   DOE (dyspnea on exertion)    Elevated PSA    Environmental and seasonal allergies    History of 2019 novel coronavirus disease (COVID-19)    History of kidney stones    HLD (hyperlipidemia)    HOH (hard of hearing)    HTN (hypertension)    Incomplete bladder emptying    Macular degeneration    Prostate cancer (HCC)    RBBB (right bundle branch block)    Valvular insufficiency    a.) TTE 07/29/2019: LVEF 50-55%; LA mild enlarged; triv AR/PR, mild MR, mod TR.     SURGICAL HISTORY   Past Surgical History:  Procedure Laterality Date   ABDOMINAL AORTIC ANEURYSM REPAIR     CATARACT EXTRACTION W/PHACO Right 09/25/2016   Procedure: CATARACT EXTRACTION PHACO AND INTRAOCULAR LENS PLACEMENT (IOC);  Surgeon: Birder Robson, MD;  Location: [facility] ORS;  Service: Ophthalmology;  Laterality: Right;  [place] 01:01 AP% 17.4 CDE 10.75 Fluid pack lot # [id number]   CATARACT EXTRACTION W/PHACO Left 10/16/2016   Procedure: CATARACT EXTRACTION PHACO AND INTRAOCULAR  LENS PLACEMENT (IOC) Suture placed in Left eye;  Surgeon: Birder Robson, MD;  Location: [facility] ORS;  Service: Ophthalmology;  Laterality: Left;  [place] 2:06.8 AP% 22.2 CDE 28.17 Fluid pack lot # [id number]   COLONOSCOPY WITH PROPOFOL N/A 03/31/2019   Procedure: COLONOSCOPY WITH PROPOFOL;  Surgeon: Lollie Sails, MD;  Location: [facility] ENDOSCOPY;  Service: Endoscopy;  Laterality: N/A;   COLONOSCOPY WITH PROPOFOL N/A 09/09/2020   Procedure: COLONOSCOPY WITH PROPOFOL;  Surgeon: Lesly Rubenstein, MD;  Location: [facility] ENDOSCOPY;  Service: Endoscopy;  Laterality: N/A;   CYSTOSCOPY W/ RETROGRADES Bilateral  02/23/2020   Procedure: CYSTOSCOPY WITH RETROGRADE PYELOGRAM;  Surgeon: Abbie Sons, MD;  Location: [facility] ORS;  Service: Urology;  Laterality: Bilateral;   CYSTOSCOPY WITH BIOPSY N/A 02/23/2020   Procedure: CYSTOSCOPY WITH BIOPSY;  Surgeon: Abbie Sons, MD;  Location: [facility] ORS;  Service: Urology;  Laterality: N/A;   EYE SURGERY     HERNIA REPAIR     x 2   INGUINAL HERNIA REPAIR Right 12/02/2015   Procedure: HERNIA REPAIR INGUINAL ADULT;  Surgeon: Leonie Green, MD;  Location: [facility] ORS;  Service: General;  Laterality: Right;   PROSTATE SURGERY  2012   Right Carotid Endarterectomy     TOTAL HIP ARTHROPLASTY Right 08/01/2021   Procedure: TOTAL HIP ARTHROPLASTY;  Surgeon: Corky Mull, MD;  Location: [facility] ORS;  Service: Orthopedics;  Laterality: Right;     FAMILY HISTORY   Family History  Problem Relation Age of Onset   Cancer Brother        2000   Prostate cancer Neg Hx    Bladder Cancer Neg Hx    Kidney cancer Neg Hx      SOCIAL HISTORY   Social History   Tobacco Use   Smoking status: Former    Types: Cigarettes    Quit date: 11/30/1998    Years since quitting: 23.0   Smokeless tobacco: Former   Tobacco comments:    quit 10 years ago  Vaping Use   Vaping Use: Never used  Substance Use Topics   Alcohol use: Not Currently    Comment: occasional   Drug use: No     MEDICATIONS    Home Medication:    Current Medication:  Current Facility-Administered Medications:    acetaminophen (TYLENOL) tablet 650 mg, 650 mg, Oral, Q4H PRN, Athena Masse, MD, 650 mg at 12/24/21 2143   aspirin EC tablet 81 mg, 81 mg, Oral, Daily, Judd Gaudier V, MD, 81 mg at 12/25/21 0846   atorvastatin (LIPITOR) tablet 40 mg, 40 mg, Oral, Daily, Judd Gaudier V, MD, 40 mg at 12/25/21 0846   azithromycin (ZITHROMAX) 500 mg in sodium chloride 0.9 % 250 mL IVPB, 500 mg, Intravenous, Q24H, Judd Gaudier V, MD, Last Rate: 250 mL/hr at 12/25/21 0359, 500 mg at 12/25/21 0359   cefTRIAXone  (ROCEPHIN) 2 g in sodium chloride 0.9 % 100 mL IVPB, 2 g, Intravenous, Q24H, Judd Gaudier V, MD, Last Rate: 200 mL/hr at 12/25/21 0123, 2 g at 12/25/21 0123   guaiFENesin (MUCINEX) 12 hr tablet 600 mg, 600 mg, Oral, BID, Amery, Sahar, MD, 600 mg at 12/25/21 0846   heparin ADULT infusion 100 units/mL (25000 units/224m), 1,150 Units/hr, Intravenous, Continuous, MDarrick Penna RPH, Last Rate: 11.5 mL/hr at 12/25/21 0248, 1,150 Units/hr at 12/25/21 0248   ipratropium-albuterol (DUONEB) 0.5-2.5 (3) MG/3ML nebulizer solution 3 mL, 3 mL, Nebulization, Q4H PRN, ANolberto Hanlon MD, 3 mL at 12/24/21 1312   iron polysaccharides (  NIFEREX) capsule 150 mg, 150 mg, Oral, BID, Kurtis Bushman, Sahar, MD   LORazepam (ATIVAN) tablet 0.5 mg, 0.5 mg, Oral, Daily PRN, Nolberto Hanlon, MD   methylPREDNISolone sodium succinate (SOLU-MEDROL) 40 mg/mL injection 40 mg, 40 mg, Intravenous, Q12H, Nolberto Hanlon, MD, 40 mg at 12/25/21 0122   metoprolol succinate (TOPROL-XL) 24 hr tablet 12.5 mg, 12.5 mg, Oral, Daily, Tang, Lily Michelle, PA-C, 12.5 mg at 12/25/21 0845   nitroGLYCERIN (NITROSTAT) SL tablet 0.4 mg, 0.4 mg, Sublingual, Q5 Min x 3 PRN, Athena Masse, MD   ondansetron [facility]) injection 4 mg, 4 mg, Intravenous, Q6H PRN, Athena Masse, MD   tamsulosin [facility]) capsule 0.4 mg, 0.4 mg, Oral, Daily, Nolberto Hanlon, MD, 0.4 mg at 12/25/21 0846    ALLERGIES   Oxycodone     REVIEW OF SYSTEMS    Review of Systems:  Gen:  Denies  fever, sweats, chills weigh loss  HEENT: Denies blurred vision, double vision, ear pain, eye pain, hearing loss, nose bleeds, sore throat Cardiac:  No dizziness, chest pain or heaviness, chest tightness,edema Resp:   reports dyspnea chronically  Gi: Denies swallowing difficulty, stomach pain, nausea or vomiting, diarrhea, constipation, bowel incontinence Gu:  Denies bladder incontinence, burning urine Ext:   Denies Joint pain, stiffness or swelling Skin: Denies  skin rash, easy bruising or  bleeding or hives Endoc:  Denies polyuria, polydipsia , polyphagia or weight change Psych:   Denies depression, insomnia or hallucinations   Other:  All other systems negative   VS: BP (!) 178/87 (BP Location: Right Arm)   Pulse 96   Temp 97.7 F (36.5 C)   Resp 18   Wt 73.9 kg   SpO2 94%   BMI 29.80 kg/m      PHYSICAL EXAM    GENERAL:NAD, no fevers, chills, no weakness no fatigue HEAD: Normocephalic, atraumatic.  EYES: Pupils equal, round, reactive to light. Extraocular muscles intact. No scleral icterus.  MOUTH: Moist mucosal membrane. Dentition intact. No abscess noted.  EAR, NOSE, THROAT: Clear without exudates. No external lesions.  NECK: Supple. No thyromegaly. No nodules. No JVD.  PULMONARY: decreased breath sounds with mild rhonchi worse at bases bilaterally.  CARDIOVASCULAR: S1 and S2. Regular rate and rhythm. No murmurs, rubs, or gallops. No edema. Pedal pulses 2+ bilaterally.  GASTROINTESTINAL: Soft, nontender, nondistended. No masses. Positive bowel sounds. No hepatosplenomegaly.  MUSCULOSKELETAL: No swelling, clubbing, or edema. Range of motion full in all extremities.  NEUROLOGIC: Cranial nerves II through XII are intact. No gross focal neurological deficits. Sensation intact. Reflexes intact.  SKIN: No ulceration, lesions, rashes, or cyanosis. Skin warm and dry. Turgor intact.  PSYCHIATRIC: Mood, affect within normal limits. The patient is awake, alert and oriented x 3. Insight, judgment intact.       IMAGING            ASSESSMENT/PLAN   Acute on chronic hypoxemic respiratory failure  -Patient had a repeat transthoracic echo performed November 24, 2021 with poor windows but overall findings are not very impressive with EF 50 to 47% and mild systolic and diastolic dysfunction.  Does not seem to be enough to fully explain current findings with severe hypoxemia -BNP is moderately elevated at 415 which does point towards cardiac etiology and this is likely  a component of his current issue although it may be secondary to whatever is causing his fevers. He had Multiple UTIs in the past, this time Urine culture is not bad.   -Due to moderate hiatal hernia and  like to perform SLP with barium to evaluate for regurgitation and aspiration -agree with blood cultures and empiric abx for CAP at this time although we may need repeat CT to get better view of her lung parenchyma   Acute on chronic normocytic anemia   -His goal transfusion threshold should be hemoglobin 8 or more -This is also a component of his current hypoxemic failure and should be worked up on outpatient basis with heme-onc service   Bilateral pleural effusions with compressive atelectasis -Patient does have renal impairment and cardiac impairment which may be culprit of -May have component of hypertensive nephropathy, will recommend DC nonessential nephrotoxins GFR is currently reduced from 56-41. -Recruitment maneuvers with MetaNeb and albuterol.  Use due to its been active for like the last hour of course now acidity       Thank you for allowing me to participate in the care of this patient.   Patient/Family are satisfied with care plan and all questions have been answered.    Provider disclosure: Patient with at least one acute or chronic illness or injury that poses a threat to life or bodily function and is being managed actively during this encounter.  All of the below services have been performed independently by signing provider:  review of prior documentation from internal and or external health records.  Review of previous and current lab results.  Interview and comprehensive assessment during patient visit today. Review of current and previous chest radiographs/CT scans. Discussion of management and test interpretation with health care team and patient/family.   This document was prepared using Dragon voice recognition software and may include unintentional dictation  errors.     Ottie Glazier, M.D.  Division of Pulmonary & Critical Care Medicine

## 2021-12-25 NOTE — Progress Notes (Signed)
Wife stayed the night with patient. Michela Pitcher it was an uneventful evening. Son Iona Beard arrived this morning and gave him an update. As we were discussing father's care, Iona Beard told his mother who was sitting next to patient, to get up and stand on the other side of him so he could teach his father a lesson. Mother said "no, stop" and son continued to press his mother to obey him. This went back and forth 5 times and the mother finally complied as the son grew more angry. He then took her by the arm and walked her out of the room telling me to talk to his father (the patient) without them in the room (in order to teach his father to listen to me). I felt uncomfortable with the son demanding his mother obey him when it was [place]. Notified the charge nurse and will continue to monitor the situation. Mother is back at bedside and son is in and out of the room.

## 2021-12-25 NOTE — Progress Notes (Signed)
ANTICOAGULATION CONSULT NOTE - Initial Consult  Pharmacy Consult for Heparin  Indication: chest pain/ACS  Allergies  Allergen Reactions   Oxycodone Other (See Comments)    Caused hallucinations/does not want anymore    Patient Measurements: Weight: 77.1 kg (170 lb) Heparin Dosing Weight: 70.9 kg   Vital Signs: Temp: 97.5 F (36.4 C) (07/09 2315) Temp Source: Oral (07/09 2315) BP: 133/92 (07/09 2315) Pulse Rate: 91 (07/09 2315)  Labs: Recent Labs    12/23/21 0140 12/23/21 0342 12/23/21 0851 12/23/21 1418 12/23/21 2147 12/24/21 0526 12/24/21 0655 12/24/21 1455 12/25/21 0024  HGB 9.6*  --   --   --   --  8.0*  --   --   --   HCT 33.3*  --   --   --   --  26.8*  --   --   --   PLT 220  --   --   --   --  185  --   --   --   APTT 28  --  72*  --   --   --   --   --   --   LABPROT 13.8  --   --   --   --   --   --   --   --   INR 1.1  --   --   --   --   --   --   --   --   HEPARINUNFRC <0.10*  --   --  0.33   < >  --  0.41 0.27* 0.40  CREATININE 1.25*  --   --   --   --  1.63*  --   --   --   TROPONINIHS 82* 1,290* 1,969* 1,357*  --   --   --   --   --    < > = values in this interval not displayed.     Estimated Creatinine Clearance: 29.3 mL/min (A) (by C-G formula based on SCr of 1.63 mg/dL (H)).   Medical History: Past Medical History:  Diagnosis Date   AAA (abdominal aortic aneurysm) ([place])    a.) s/p repair in 2005   Anemia    Anginal pain ([place])    Anxiety    Arthritis    B12 deficiency    Bilateral cataracts    a.) s/p BILATERAL extractions in 2018   BPH with obstruction/lower urinary tract symptoms    CAD (coronary artery disease)    Carotid artery stenosis    a.) s/p CEA on the RIGHT   COPD (chronic obstructive pulmonary disease) (HCC)    Diastolic dysfunction 70/35/0093   a.)  TTE 07/29/2019: EF 50-55%; LA mildly enlarged; G1DD.   DOE (dyspnea on exertion)    Elevated PSA    Environmental and seasonal allergies    History of 2019 novel  coronavirus disease (COVID-19)    History of kidney stones    HLD (hyperlipidemia)    HOH (hard of hearing)    HTN (hypertension)    Incomplete bladder emptying    Macular degeneration    Prostate cancer (HCC)    RBBB (right bundle branch block)    Valvular insufficiency    a.) TTE 07/29/2019: LVEF 50-55%; LA mild enlarged; triv AR/PR, mild MR, mod TR.    Medications:  Medications Prior to Admission  Medication Sig Dispense Refill Last Dose   acetaminophen (TYLENOL) 500 MG tablet Take 1-2 tablets (500-1,000 mg total) by mouth every 6 (  six) hours as needed for mild pain. 60 tablet 0    Fluticasone-Umeclidin-Vilant (TRELEGY ELLIPTA) 100-62.5-25 MCG/INH AEPB Inhale 2 puffs into the lungs daily.   12/22/2021 at 2300   iron polysaccharides (NIFEREX) 150 MG capsule Take 150 mg by mouth 2 (two) times daily.   unknown at unknown   LORazepam (ATIVAN) 0.5 MG tablet Take 0.5 mg by mouth daily as needed for anxiety.   prn at prn   losartan (COZAAR) 100 MG tablet Take 100 mg by mouth daily.    12/22/2021 at 2300   tamsulosin (FLOMAX) 0.4 MG CAPS capsule Take 1 capsule (0.4 mg total) by mouth daily. 30 capsule 0 12/22/2021 at 1100   apixaban (ELIQUIS) 2.5 MG TABS tablet Take 1 tablet (2.5 mg total) by mouth 2 (two) times daily. (Patient not taking: Reported on 12/23/2021) 30 tablet 0 Not Taking   atorvastatin (LIPITOR) 40 MG tablet Take 40 mg by mouth daily. (Patient not taking: Reported on 12/23/2021)   Not Taking   iron polysaccharides (NIFEREX) 150 MG capsule Take 150 mg by mouth daily. (Patient not taking: Reported on 12/23/2021)   Not Taking   ondansetron (ZOFRAN) 4 MG tablet Take 1 tablet (4 mg total) by mouth every 6 (six) hours as needed for nausea. (Patient not taking: Reported on 12/23/2021) 30 tablet 0 Not Taking    Assessment: Pharmacy consulted to dose heparin in this 88 year old male admitted with ACS/NSTEMI.   Pt was on Eliquis 5 mg PO BID PTA.  Pt Wife states he hasn't taken Eliquis since this past  April.  CrCl = 38.2 ml/min   Goal of Therapy:  Heparin level 0.3-0.7 units/ml aPTT 66 - 102 seconds Monitor platelets by anticoagulation protocol: Yes   Date Time HL Rate/Comment 7/08 0140 <0.10 7/08 1418 0.33 Therapeutic x1 7/08 2147 0.28 Subtherapeutic 7/9 0655 0.41 Therapeutic x1 7/9 1455 0.27 Subtherapeutic 7/10     0024    0.40     Therapeutic X 1   Plan:  7/10:  HL @ 0024 = 0.40, therapeutic X 1  Will continue pt on current rate and draw confirmation level on 7/10 @ 0800.   Lexx Monte D 12/25/2021,12:59 AM

## 2021-12-25 NOTE — Evaluation (Signed)
Clinical/Bedside Swallow Evaluation Patient Details  Name: Ruben Green MRN: [id number] Date of Birth: 28-Jan-1934  Today's Date: 12/25/2021 Time: SLP Start Time (ACUTE ONLY): 86 SLP Stop Time (ACUTE ONLY): 1055 SLP Time Calculation (min) (ACUTE ONLY): 15 min  Past Medical History:  Past Medical History:  Diagnosis Date   AAA (abdominal aortic aneurysm) ([place])    a.) s/p repair in 2005   Anemia    Anginal pain ([place])    Anxiety    Arthritis    B12 deficiency    Bilateral cataracts    a.) s/p BILATERAL extractions in 2018   BPH with obstruction/lower urinary tract symptoms    CAD (coronary artery disease)    Carotid artery stenosis    a.) s/p CEA on the RIGHT   COPD (chronic obstructive pulmonary disease) (HCC)    Diastolic dysfunction 13/24/4010   a.)  TTE 07/29/2019: EF 50-55%; LA mildly enlarged; G1DD.   DOE (dyspnea on exertion)    Elevated PSA    Environmental and seasonal allergies    History of 2019 novel coronavirus disease (COVID-19)    History of kidney stones    HLD (hyperlipidemia)    HOH (hard of hearing)    HTN (hypertension)    Incomplete bladder emptying    Macular degeneration    Prostate cancer (HCC)    RBBB (right bundle branch block)    Valvular insufficiency    a.) TTE 07/29/2019: LVEF 50-55%; LA mild enlarged; triv AR/PR, mild MR, mod TR.   Past Surgical History:  Past Surgical History:  Procedure Laterality Date   ABDOMINAL AORTIC ANEURYSM REPAIR     CATARACT EXTRACTION W/PHACO Right 09/25/2016   Procedure: CATARACT EXTRACTION PHACO AND INTRAOCULAR LENS PLACEMENT (IOC);  Surgeon: Birder Robson, MD;  Location: [facility];  Service: Ophthalmology;  Laterality: Right;  [place] 01:01 AP% 17.4 CDE 10.75 Fluid pack lot # [id number]   CATARACT EXTRACTION W/PHACO Left 10/16/2016   Procedure: CATARACT EXTRACTION PHACO AND INTRAOCULAR LENS PLACEMENT ([place]) Suture placed in Left eye;  Surgeon: Birder Robson, MD;  Location: [facility];  Service:  Ophthalmology;  Laterality: Left;  [place] 2:06.8 AP% 22.2 CDE 28.17 Fluid pack lot # [id number]   COLONOSCOPY WITH PROPOFOL N/A 03/31/2019   Procedure: COLONOSCOPY WITH PROPOFOL;  Surgeon: Lollie Sails, MD;  Location: [facility];  Service: Endoscopy;  Laterality: N/A;   COLONOSCOPY WITH PROPOFOL N/A 09/09/2020   Procedure: COLONOSCOPY WITH PROPOFOL;  Surgeon: Lesly Rubenstein, MD;  Location: [facility];  Service: Endoscopy;  Laterality: N/A;   CYSTOSCOPY W/ RETROGRADES Bilateral 02/23/2020   Procedure: CYSTOSCOPY WITH RETROGRADE PYELOGRAM;  Surgeon: Abbie Sons, MD;  Location: [facility];  Service: Urology;  Laterality: Bilateral;   CYSTOSCOPY WITH BIOPSY N/A 02/23/2020   Procedure: CYSTOSCOPY WITH BIOPSY;  Surgeon: Abbie Sons, MD;  Location: [facility];  Service: Urology;  Laterality: N/A;   EYE SURGERY     HERNIA REPAIR     x 2   INGUINAL HERNIA REPAIR Right 12/02/2015   Procedure: HERNIA REPAIR INGUINAL ADULT;  Surgeon: Leonie Green, MD;  Location: [facility];  Service: General;  Laterality: Right;   PROSTATE SURGERY  2012   Right Carotid Endarterectomy     TOTAL HIP ARTHROPLASTY Right 08/01/2021   Procedure: TOTAL HIP ARTHROPLASTY;  Surgeon: Corky Mull, MD;  Location: [facility];  Service: Orthopedics;  Laterality: Right;   HPI:  Per H&P "Ruben Green is a 88 y.o. male with medical history significant for CAD,  COPD, HTN, prostate cancer, AAA, history of postoperative hypoxia s/p hip replacement in February 2023, hospitalized in February 2023 with sepsis secondary to pneumonia and some concern for CHF with echo showing grade 1 DD, discharged home on 3 L oxygen who presents to the ED by EMS in acute respiratory distress that started during the afternoon hours with O2 sats 88% on room air.  He had no chest pain.  Patient had a several day history of cough and congestion and fever.  O2 sat on room air with EMS was in the 70s and he was placed on NRB  ED course and data  review: Tmax rectal was 102.2 with tachycardia to 130 and respirations 28-35.  Patient was transitioned from NRB to BiPAP on arrival and desaturated to the 70s during transition with O2 sat improving to 96 after placement of BiPAP.  BP was elevated with systolic in the 481E.  Labs: ABG on BiPAP at 14/8 FiO2 45% with pH 7.43 PCO2 33 and PO2 79.  WBC 13,000 with lactic acid 1.4.  Hemoglobin 9.6 which appears to be baseline.  Creatinine 1.25, potassium 5.3  Troponin 82-1290 and BNP 415  COVID and flu negative  EKG, personally reviewed and interpreted, sinus tachycardia at 130 with RBBB and no acute ST-T wave changes.  Chest x-ray with mild CHF changes  Patient was treated with IV Lasix, DuoNebs and Solu-Medrol, cefepime and vancomycin.  Hospitalist consulted for admission for acute respiratory failure of multifactorial etiology." CXR 12/23/21 "Mild changes of CHF."    Assessment / Plan / Recommendation  Clinical Impression  Pt seen for clinical swallowing evaluation. Pt alert, pleasant, and cooperative. Superior interpreter. Son present for portions of evaluation as was Therapist, sports. Cleared with RN.   Oral motor examination completed and significant for presence of upper and lower dentures. Otherwise, unremarkable.  Per chart review, temp and WBC WNL. Pt on 3L/min O2 via [place].   Pt denies s/sx of oral or pharyngeal dysphagia. Pt with c/o intermittent globus sensation with solids; not reproduced on evaluation.   Pt given trials of solid, pureed, and thin liquids. Pt demonstrated a functional oral swallow. Trace lingual residual appreciated which cleared with pt-initiated sips of thin liquids. Pharyngeal swallow appeared [facility] per clinical assessment. No overt or subtle s/sx pharyngeal dysphagia. To palpation, with pt with seemingly timely swallow initiation and seemingly adequate laryngeal elevation. No change to vocal quality across trials.   Recommend continuation of a regular consistency diet with thin liquids and  safe swallowing strategies/aspiration precautions/reflux precautions as outlined below.   Given pt's complaints of intermittent globus sensation and MD's concern for aspiration in setting of hiatal hernia (noted Chest CTA 08/08/21), recommend further esophageal assessment.   Pt, RN, and MD made aware of results, recommendations, and SLP POC. Pt verbalized understanding/agreement.   SLP to sign off as pt without SLP needs at this time.    SLP Visit Diagnosis: Dysphagia, pharyngoesophageal phase (R13.14)    Aspiration Risk  Mild aspiration risk    Diet Recommendation Regular;Thin liquid   Medication Administration:  (as tolerated) Supervision: Patient able to self feed Compensations: Minimize environmental distractions;Slow rate;Small sips/bites;Follow solids with liquid (reflux precautions) Postural Changes: Seated upright at 90 degrees;Remain upright for at least 30 minutes after po intake    Other  Recommendations Recommended Consults: Consider GI evaluation;Consider esophageal assessment Oral Care Recommendations: Oral care BID;Patient independent with oral care (set up)    Recommendations for follow up therapy are one component of a multi-disciplinary discharge  planning process, led by the attending physician.  Recommendations may be updated based on patient status, additional functional criteria and insurance authorization.  Follow up Recommendations No SLP follow up      Assistance Recommended at Discharge None  Functional Status Assessment Patient has not had a recent decline in their functional status      Prognosis Prognosis for Safe Diet Advancement: Fair Barriers to Reach Goals:  (comorbidities)      Swallow Study   General Date of Onset: 12/25/21 HPI: Per H&P "Ruben Green is a 88 y.o. male with medical history significant for CAD, COPD, HTN, prostate cancer, AAA, history of postoperative hypoxia s/p hip replacement in February 2023, hospitalized in February  2023 with sepsis secondary to pneumonia and some concern for CHF with echo showing grade 1 DD, discharged home on 3 L oxygen who presents to the ED by EMS in acute respiratory distress that started during the afternoon hours with O2 sats 88% on room air.  He had no chest pain.  Patient had a several day history of cough and congestion and fever.  O2 sat on room air with EMS was in the 70s and he was placed on NRB  ED course and data review: Tmax rectal was 102.2 with tachycardia to 130 and respirations 28-35.  Patient was transitioned from NRB to BiPAP on arrival and desaturated to the 70s during transition with O2 sat improving to 96 after placement of BiPAP.  BP was elevated with systolic in the 810F.  Labs: ABG on BiPAP at 14/8 FiO2 45% with pH 7.43 PCO2 33 and PO2 79.  WBC 13,000 with lactic acid 1.4.  Hemoglobin 9.6 which appears to be baseline.  Creatinine 1.25, potassium 5.3  Troponin 82-1290 and BNP 415  COVID and flu negative  EKG, personally reviewed and interpreted, sinus tachycardia at 130 with RBBB and no acute ST-T wave changes.  Chest x-ray with mild CHF changes  Patient was treated with IV Lasix, DuoNebs and Solu-Medrol, cefepime and vancomycin.  Hospitalist consulted for admission for acute respiratory failure of multifactorial etiology." CXR 12/23/21 "Mild changes of CHF." Type of Study: Bedside Swallow Evaluation Diet Prior to this Study: Regular;Thin liquids Temperature Spikes Noted: No Respiratory Status: Nasal cannula (3L/min) History of Recent Intubation: No Behavior/Cognition: Alert;Cooperative;Pleasant mood Oral Cavity Assessment: Within Functional Limits Oral Care Completed by SLP: Recent completion by staff Oral Cavity - Dentition: Dentures, top;Dentures, bottom Vision: Functional for self-feeding Self-Feeding Abilities: Able to feed self Patient Positioning: Upright in bed Baseline Vocal Quality: Normal Volitional Cough: Strong Volitional Swallow: Able to elicit     Oral/Motor/Sensory Function Overall Oral Motor/Sensory Function: Within functional limits   Ice Chips Ice chips: Not tested   Thin Liquid Thin Liquid: Within functional limits Presentation: Self Fed;Straw Other Comments: 3 oz water challenge    Nectar Thick Nectar Thick Liquid: Not tested   Honey Thick Honey Thick Liquid: Not tested   Puree Puree: Within functional limits Presentation: Self Fed Other Comments: ~2 oz   Solid     Solid: Within functional limits Presentation: Self Fed Other Comments: x1 graham cracker; trace oral residual cleared with liquid wash     Ruben Green, M.S., [facility] [phone] ([place])  Ruben Green 12/25/2021,11:12 AM

## 2021-12-25 NOTE — Consult Note (Signed)
   Heart Failure Nurse Navigator Note  HFmrEF 50 to 55%.  Grade 1 diastolic dysfunction.  And February 2023 ejection fraction was noted to be 60 to 65%.  He presented to the emergency room with complaints of shortness of breath, had also noted several days of cough, congestion and fever.  BNP 415.  Chest x-ray with mild vascular congestion with interstitial edema.  Comorbidities:  Peripheral vascular disease Coronary atherosclerosis Hypertension COPD Anxiety Arthritis Hard of hearing  Medications:  Aspirin 81 mg daily Atorvastatin 40 mg daily Metoprolol succinate 12 and half milligrams daily  Labs:  Sodium 136, potassium 4.9, chloride 105, CO2 21, BUN 48, creatinine 1.5 Weight 73.9 kg Intake 458 mL Output 950 mL   Initiation meeting with patient and his wife.  who is at the bedside.  Wife decline interpreter as she states she would assist in him understanding.  Discussed heart failure and what is means.    Stressed the importance of removing salt shaker from the table and eating lower sodium foods.  Patient becomes upset, asking me how I could do this and did I use salt.  Explained that I was trying to understanding the relationship with salt and heart failure.    Also went over the importance of daily weights and what to report.  Being compliant with medication.  And fluid restriction.  Also discussed using natural spices along with Mrs. Deliah Boston which the wife states she has at home  Also made aware of follow-up in the outpatient heart failure clinic on July 27 at 3:30 in the afternoon.  He has a 4% no-show which is 5 out of 133 appointments.  They were left the living with heart failure teaching booklet, zone magnet, info on low-sodium and heart failure along with a weight chart.  Pricilla Riffle RN [facility]

## 2021-12-25 NOTE — Progress Notes (Addendum)
PROGRESS NOTE    Wilkins Ruben Green  NUU:[id number] DOB: 05-29-1934 DOA: 12/23/2021 PCP: Tracie Harrier, MD    Brief Narrative:  Ruben Green is a 88 y.o. male with medical history significant for CAD, COPD, HTN, prostate cancer, AAA, history of postoperative hypoxia s/p hip replacement in February 2023, hospitalized in February 2023 with sepsis secondary to pneumonia and some concern for CHF with echo showing grade 1 DD, discharged home on 3 L oxygen who presents to the ED by EMS in acute respiratory distress that started during the afternoon hours with O2 sats 88% on room air.  He had no chest pain.  Patient had a several day history of cough and congestion and fever.  O2 sat on room air with EMS was in the 70s and he was placed on NRB ED course and data review: Tmax rectal was 102.2 with tachycardia to 130 and respirations 28-35.  Patient was transitioned from NRB to BiPAP on arrival and desaturated to the 70s during transition with O2 sat improving to 96 after placement of BiPAP.  BP was elevated with systolic in the 347Q. Labs: ABG on BiPAP at 14/8 FiO2 45% with pH 7.43 PCO2 33 and PO2 79. WBC 13,000 with lactic acid 1.4.  Hemoglobin 9.6 which appears to be baseline.  Creatinine 1.25, potassium 5.3 Troponin 82-1290 and BNP 415 COVID and flu negative Chest x-ray with mild CHF changes Patient was treated with IV Lasix, DuoNebs and Solu-Medrol, cefepime and vancomycin.   7/9 02 weaned to 8-9 L [place] with 02 sat 92%. Does become sob with conversation. Lasix held. Creatinine up  7/10 down to 3L [place].     Consultants:  Cardiology  Procedures:   Antimicrobials:  Ceftriaxone Azithromycin Vancomycin x1   Subjective: Less sob , no cp, feels his nose is congested  Objective: Vitals:   12/25/21 0753 12/25/21 1232 12/25/21 1410 12/25/21 1544  BP: (!) 178/87 (!) 191/91 (!) 176/76 (!) 150/77  Pulse: 96 93  86  Resp: '18 18  16  '$ Temp: 97.7 F (36.5 C) 97.7 F (36.5 C)  98.2  F (36.8 C)  TempSrc:      SpO2: 94% 98%  94%  Weight:        Intake/Output Summary (Last 24 hours) at 12/25/2021 1902 Last data filed at 12/25/2021 1857 Gross per 24 hour  Intake 938 ml  Output 1300 ml  Net -362 ml   Filed Weights   12/23/21 0133 12/25/21 0200  Weight: 77.1 kg 73.9 kg    Examination: Calm, NAD Less rhonchorus, no rales or wheezing Reg s1/s2 no gallop Soft benign +bs No edema Awake and alert.  Mood and affect appropriate in current setting     Data Reviewed: I have personally reviewed following labs and imaging studies  CBC: Recent Labs  Lab 12/23/21 0140 12/24/21 0526 12/25/21 0411  WBC 13.2* 12.2* 7.8  NEUTROABS 11.9*  --   --   HGB 9.6* 8.0* 8.0*  HCT 33.3* 26.8* 26.0*  MCV 90.7 88.4 86.7  PLT 220 185 259   Basic Metabolic Panel: Recent Labs  Lab 12/23/21 0140 12/24/21 0526 12/25/21 0411  NA 142 136 136  K 5.3* 4.7 4.9  CL 113* 105 105  CO2 23 22 21*  GLUCOSE 164* 134* 168*  BUN 23 37* 48*  CREATININE 1.25* 1.63* 1.50*  CALCIUM 8.5* 8.2* 8.1*  MG 1.9  --   --    GFR: Estimated Creatinine Clearance: 31.2 mL/min (A) (by C-G  formula based on SCr of 1.5 mg/dL (H)). Liver Function Tests: Recent Labs  Lab 12/23/21 0140  AST 28  ALT 25  ALKPHOS 73  BILITOT 0.7  PROT 6.6  ALBUMIN 3.6   No results for input(s): "LIPASE", "AMYLASE" in the last 168 hours. No results for input(s): "AMMONIA" in the last 168 hours. Coagulation Profile: Recent Labs  Lab 12/23/21 0140  INR 1.1   Cardiac Enzymes: No results for input(s): "CKTOTAL", "CKMB", "CKMBINDEX", "TROPONINI" in the last 168 hours. BNP (last 3 results) No results for input(s): "PROBNP" in the last 8760 hours. HbA1C: No results for input(s): "HGBA1C" in the last 72 hours. CBG: No results for input(s): "GLUCAP" in the last 168 hours. Lipid Profile: No results for input(s): "CHOL", "HDL", "LDLCALC", "TRIG", "CHOLHDL", "LDLDIRECT" in the last 72 hours. Thyroid Function  Tests: No results for input(s): "TSH", "T4TOTAL", "FREET4", "T3FREE", "THYROIDAB" in the last 72 hours. Anemia Panel: No results for input(s): "VITAMINB12", "FOLATE", "FERRITIN", "TIBC", "IRON", "RETICCTPCT" in the last 72 hours. Sepsis Labs: Recent Labs  Lab 12/23/21 0140 12/23/21 1418  PROCALCITON  --  4.86  LATICACIDVEN 1.4  --     Recent Results (from the past 240 hour(s))  Resp Panel by RT-PCR (Flu A&B, Covid) Anterior Nasal Swab     Status: None   Collection Time: 12/23/21  1:40 AM   Specimen: Anterior Nasal Swab  Result Value Ref Range Status   SARS Coronavirus 2 by RT PCR NEGATIVE NEGATIVE Final    Comment: (NOTE) SARS-CoV-2 target nucleic acids are NOT DETECTED.  The SARS-CoV-2 RNA is generally detectable in upper respiratory specimens during the acute phase of infection. The lowest concentration of SARS-CoV-2 viral copies this assay can detect is 138 copies/mL. A negative result does not preclude SARS-Cov-2 infection and should not be used as the sole basis for treatment or other patient management decisions. A negative result may occur with  improper specimen collection/handling, submission of specimen other than nasopharyngeal swab, presence of viral mutation(s) within the areas targeted by this assay, and inadequate number of viral copies(<138 copies/mL). A negative result must be combined with clinical observations, patient history, and epidemiological information. The expected result is Negative.  Fact Sheet for Patients:  EntrepreneurPulse.com.au  Fact Sheet for Healthcare Providers:  IncredibleEmployment.be  This test is no t yet approved or cleared by the [place] FDA and  has been authorized for detection and/or diagnosis of SARS-CoV-2 by FDA under an Emergency Use Authorization (EUA). This EUA will remain  in effect (meaning this test can be used) for the duration of the COVID-19 declaration under Section  564(b)(1) of the Act, 21 U.S.C.section 360bbb-3(b)(1), unless the authorization is terminated  or revoked sooner.       Influenza A by PCR NEGATIVE NEGATIVE Final   Influenza B by PCR NEGATIVE NEGATIVE Final    Comment: (NOTE) The Xpert Xpress SARS-CoV-2/FLU/RSV plus assay is intended as an aid in the diagnosis of influenza from Nasopharyngeal swab specimens and should not be used as a sole basis for treatment. Nasal washings and aspirates are unacceptable for Xpert Xpress SARS-CoV-2/FLU/RSV testing.  Fact Sheet for Patients: EntrepreneurPulse.com.au  Fact Sheet for Healthcare Providers: IncredibleEmployment.be  This test is not yet approved or cleared by the [place] FDA and has been authorized for detection and/or diagnosis of SARS-CoV-2 by FDA under an Emergency Use Authorization (EUA). This EUA will remain in effect (meaning this test can be used) for the duration of the COVID-19 declaration under Section 564(b)(1) of  the Act, 21 U.S.C. section 360bbb-3(b)(1), unless the authorization is terminated or revoked.  Performed at [facility], [address] [place], [place] [address]   Blood Culture (routine x 2)     Status: None (Preliminary result)   Collection Time: 12/23/21  1:40 AM   Specimen: BLOOD  Result Value Ref Range Status   Specimen Description BLOOD RIGHT ANTECUBITAL  Final   Special Requests   Final    BOTTLES DRAWN AEROBIC AND ANAEROBIC Blood Culture results may not be optimal due to an excessive volume of blood received in culture bottles   Culture   Final    NO GROWTH 2 DAYS Performed at [facility], [address], [place], [place] [address]    Report Status PENDING  Incomplete  Blood Culture (routine x 2)     Status: None (Preliminary result)   Collection Time: 12/23/21  1:40 AM   Specimen: BLOOD  Result Value Ref Range Status   Specimen Description BLOOD LEFT ANTECUBITAL  Final   Special  Requests   Final    BOTTLES DRAWN AEROBIC AND ANAEROBIC Blood Culture results may not be optimal due to an excessive volume of blood received in culture bottles   Culture   Final    NO GROWTH 2 DAYS Performed at [facility], [address] [place], [place] [address]    Report Status PENDING  Incomplete  Urine Culture     Status: Abnormal   Collection Time: 12/23/21  4:44 AM   Specimen: In/Out Cath Urine  Result Value Ref Range Status   Specimen Description   Final    IN/OUT CATH URINE Performed at [facility], [address], [place], [place] [address]    Special Requests   Final    NONE Performed at [facility], [address], [place], [place] [address]    Culture (A)  Final    100 COLONIES/mL STAPHYLOCOCCUS EPIDERMIDIS CALL MICROBIOLOGY LAB IF SENSITIVITIES ARE REQUIRED. Performed at [facility], [place] [address] [place], [place] [address]    Report Status 12/24/2021 FINAL  Final         Radiology Studies: CT Chest High Resolution  Result Date: 12/25/2021 CLINICAL DATA:  Hypoxia EXAM: CT CHEST WITHOUT CONTRAST TECHNIQUE: Multidetector CT imaging of the chest was performed following the standard protocol without intravenous contrast. High resolution imaging of the lungs, as well as inspiratory and expiratory imaging, was performed. RADIATION DOSE REDUCTION: This exam was performed according to the departmental dose-optimization program which includes automated exposure control, adjustment of the mA and/or kV according to patient size and/or use of iterative reconstruction technique. COMPARISON:  Chest CT dated August 08, 2021 FINDINGS: Cardiovascular: Normal heart size. Trace pericardial fluid. Calcifications of the mitral valve and chordae tendineae. Severe left main, lad and circumflex coronary artery calcifications. Severe atherosclerotic disease of the thoracic aorta. Mediastinum/Nodes: Moderate hiatal hernia. Thyroid is unremarkable.  No pathologically enlarged lymph nodes seen in the chest. Lungs/Pleura: Central airways are patent. No significant air trapping. Severe centrilobular emphysema. New bronchial wall thickening of the bilateral lower lobes, ground-glass opacity of the posterior right upper lobe and bilateral lower lobe consolidations. Trace left-greater-than-right pleural effusions. Upper Abdomen: Simple appearing cysts of the left kidney, no further follow-up imaging recommended. Diverticulosis. No acute abnormality. Musculoskeletal: No chest wall mass or suspicious bone lesions identified. IMPRESSION: 1. New bilateral lower lobe consolidations, ground-glass opacity of the posterior right upper lobe and lower lobe bronchial wall thickening, findings  can be seen in the setting of infection or aspiration. Recommend follow-up chest CT in 3 months to ensure resolution. 2. No evidence of fibrotic interstitial lung disease. 3. Moderate hiatal hernia. 4. Trace left-greater-than-right pleural effusions. 5. Aortic Atherosclerosis (ICD10-I70.0) and Emphysema (ICD10-J43.9). Electronically Signed   By: Yetta Glassman M.D.   On: 12/25/2021 16:19   ECHOCARDIOGRAM COMPLETE  Result Date: 12/24/2021    ECHOCARDIOGRAM REPORT   Patient Name:   YAHIA BOTTGER Date of Exam: 12/24/2021 Medical Rec #:  [id number]           Height:       62.0 in Accession #:    [id number]          Weight:       170.0 lb Date of Birth:  06-24-1933           BSA:          1.784 m Patient Age:    51 years            BP:           141/77 mmHg Patient Gender: M                   HR:           88 bpm. Exam Location:  [facility] Procedure: 2D Echo and Intracardiac Opacification Agent Indications:     NSTEMI I21.4  History:         Patient has prior history of Echocardiogram examinations, most                  recent 08/10/2021.  Sonographer:     Kathlen Brunswick RDCS Referring Phys:  [id number] Athena Masse Diagnosing Phys: Donnelly Angelica  Sonographer Comments: Technically difficult  study due to poor echo windows and suboptimal apical window. Image acquisition challenging due to patient body habitus. IMPRESSIONS  1. Left ventricular ejection fraction, by estimation, is 50 to 55%. The left ventricle has low normal function. The left ventricle has no regional wall motion abnormalities. The left ventricular internal cavity size was mildly dilated. Left ventricular diastolic parameters are consistent with Grade I diastolic dysfunction (impaired relaxation).  2. Right ventricular systolic function is normal. The right ventricular size is normal.  3. The mitral valve is grossly normal. Trivial mitral valve regurgitation.  4. The aortic valve is grossly normal. Aortic valve regurgitation is not visualized. No aortic stenosis is present.  5. The inferior vena cava is normal in size with greater than 50% respiratory variability, suggesting right atrial pressure of 3 mmHg. Conclusion(s)/Recommendation(s): Technically difficult study. Compared to prior study, EF appears decreased, though quantification is difficult in setting of difficult imaging. FINDINGS  Left Ventricle: Left ventricular ejection fraction, by estimation, is 50 to 55%. The left ventricle has low normal function. The left ventricle has no regional wall motion abnormalities. Definity contrast agent was given IV to delineate the left ventricular endocardial borders. The left ventricular internal cavity size was mildly dilated. There is no left ventricular hypertrophy. Left ventricular diastolic parameters are consistent with Grade I diastolic dysfunction (impaired relaxation). Right Ventricle: The right ventricular size is normal. Right vetricular wall thickness was not well visualized. Right ventricular systolic function is normal. Left Atrium: Left atrial size was normal in size. Right Atrium: Right atrial size was not well visualized. Pericardium: There is no evidence of pericardial effusion. Mitral Valve: The mitral valve is grossly  normal. Trivial mitral valve regurgitation. Tricuspid Valve: The tricuspid valve is  grossly normal. Tricuspid valve regurgitation is trivial. Aortic Valve: The aortic valve is grossly normal. Aortic valve regurgitation is not visualized. No aortic stenosis is present. Aortic valve peak gradient measures 6.0 mmHg. Pulmonic Valve: The pulmonic valve was not well visualized. Aorta: The aortic root is normal in size and structure. Venous: The inferior vena cava is normal in size with greater than 50% respiratory variability, suggesting right atrial pressure of 3 mmHg. IAS/Shunts: No atrial level shunt detected by color flow Doppler.  LEFT VENTRICLE PLAX 2D LVIDd:         6.08 cm   Diastology LVIDs:         4.42 cm   LV e' medial:    6.53 cm/s LV PW:         1.09 cm   LV E/e' medial:  10.0 LV IVS:        1.10 cm   LV e' lateral:   4.57 cm/s LVOT diam:     2.00 cm   LV E/e' lateral: 14.4 LV SV:         63 LV SV Index:   35 LVOT Area:     3.14 cm  RIGHT VENTRICLE RV Basal diam:  3.10 cm RV S prime:     11.40 cm/s TAPSE (M-mode): 1.7 cm LEFT ATRIUM           Index        RIGHT ATRIUM           Index LA diam:      5.00 cm 2.80 cm/m   RA Area:     13.10 cm LA Vol (A4C): 42.2 ml 23.65 ml/m  RA Volume:   29.60 ml  16.59 ml/m  AORTIC VALVE                 PULMONIC VALVE AV Area (Vmax): 2.30 cm     PV Vmax:       0.73 m/s AV Vmax:        122.00 cm/s  PV Peak grad:  2.1 mmHg AV Peak Grad:   6.0 mmHg LVOT Vmax:      89.20 cm/s LVOT Vmean:     53.400 cm/s LVOT VTI:       0.200 m  AORTA Ao Root diam: 3.30 cm MITRAL VALVE MV Area (PHT): 5.27 cm     SHUNTS MV Decel Time: 144 msec     Systemic VTI:  0.20 m MV E velocity: 65.60 cm/s   Systemic Diam: 2.00 cm MV A velocity: 115.00 cm/s MV E/A ratio:  0.57 Donnelly Angelica Electronically signed by Donnelly Angelica Signature Date/Time: 12/24/2021/1:04:46 PM    Final         Scheduled Meds:  [START ON 12/26/2021] amLODipine  10 mg Oral Daily   amLODipine  5 mg Oral Once   aspirin EC  81 mg  Oral Daily   atorvastatin  40 mg Oral Daily   guaiFENesin  600 mg Oral BID   iron polysaccharides  150 mg Oral BID   methylPREDNISolone (SOLU-MEDROL) injection  40 mg Intravenous Q12H   [START ON 12/26/2021] metoprolol succinate  25 mg Oral Daily   tamsulosin  0.4 mg Oral Daily   Continuous Infusions:  azithromycin 500 mg (12/25/21 0359)   cefTRIAXone (ROCEPHIN)  IV 2 g (12/25/21 0123)    Assessment & Plan:   Principal Problem:   Acute respiratory failure with hypoxia (HCC) Active Problems:   Sepsis (HCC)   Acute on chronic diastolic CHF (  congestive heart failure) (HCC)   Respiratory tract infection   COPD with acute exacerbation (HCC)   Essential (primary) hypertension   Abdominal aortic aneurysm (AAA) without rupture   Coronary artery disease involving native coronary artery of native heart without angina pectoris   Anemia   Elevated troponin   Acute respiratory failure with hypoxia (HCC) Multifactorial and related to COPD and possible respiratory tract infection as well as acute CHF Suspect patient has underlying chronic respiratory failure given discharge back in February on O2 at 3 L which he was not wearing 7/9 weaned from bipap to 8L Still sob .  Will add steroid iv as has some wheezing Add mucinex Hold lasix as creatinine up Pccm input was appreciated, repeat ct obtained.  SPL to evaluate aspiration with hiatal hernia Continue iv abx      Elevated Troponin CAD Due to demand ischemia, hard to r/o ACS at this time. TP trending down Echo pending Cards consulted, input appreciated. Continue asa Heparin completed Started on beta blk Echo as above      Respiratory tract infection Acute bronchitis versus CAP Procalcitonin elevated Continue iv abx 7/10 ucx with staph epid. -likely contaminant , no tx needed  Hx/o anemia Resume home iron   Acute on chronic diastolic CHF (congestive heart failure) ([place]) Echo in February 2023 with grade 1 diastolic  dysfunction Elevated BNP and chest x-ray showing mild CHF changes Cards was consulted. 7/9 hold lasix as creatinine up.  I/o, daily wt   Sepsis ([place]) Likely due to respiratory infection Treat with ivabx  AKI on CKD stage IIIa Creatinine up with diuretics Monitor Hold nephrotoxic meds   COPD with acute exacerbation (HCC)  Start steroids as has some wheezing on exam Still sob.  Tx as above   Essential (primary) hypertension Bp stable Hold losartan due to elevated creatinine   Abdominal aortic aneurysm (AAA) without rupture Acute complication not suspected at this time as patient denies pain   DVT prophylaxis: Heparin Code Status: Full Family Communication: Son and wife at bedside Disposition Plan: To be determined, PT OT consult Status is: Inpatient Remains inpatient appropriate because: IV treatment        LOS: 2 days   Time spent: 35 minutes with more than 50% on [place], MD [facility] Pager [phone]  If 7PM-7AM, please contact night-coverage 12/25/2021, 7:02 PM

## 2021-12-25 NOTE — Progress Notes (Signed)
ANTICOAGULATION CONSULT NOTE - Initial Consult  Pharmacy Consult for Heparin  Indication: chest pain/ACS  Allergies  Allergen Reactions   Oxycodone Other (See Comments)    Caused hallucinations/does not want anymore    Patient Measurements: Weight: 73.9 kg (162 lb 14.7 oz) Heparin Dosing Weight: 70.9 kg   Vital Signs: Temp: 97.7 F (36.5 C) (07/10 0753) Temp Source: Oral (07/09 2315) BP: 178/87 (07/10 0753) Pulse Rate: 96 (07/10 0753)  Labs: Recent Labs    12/23/21 0140 12/23/21 0342 12/23/21 0851 12/23/21 1418 12/23/21 2147 12/24/21 0526 12/24/21 0655 12/24/21 1455 12/25/21 0024 12/25/21 0411 12/25/21 0741  HGB 9.6*  --   --   --   --  8.0*  --   --   --  8.0*  --   HCT 33.3*  --   --   --   --  26.8*  --   --   --  26.0*  --   PLT 220  --   --   --   --  185  --   --   --  195  --   APTT 28  --  72*  --   --   --   --   --   --   --   --   LABPROT 13.8  --   --   --   --   --   --   --   --   --   --   INR 1.1  --   --   --   --   --   --   --   --   --   --   HEPARINUNFRC <0.10*  --   --  0.33   < >  --    < > 0.27* 0.40  --  0.46  CREATININE 1.25*  --   --   --   --  1.63*  --   --   --  1.50*  --   TROPONINIHS 82* 1,290* 1,969* 1,357*  --   --   --   --   --   --   --    < > = values in this interval not displayed.     Estimated Creatinine Clearance: 31.2 mL/min (A) (by C-G formula based on SCr of 1.5 mg/dL (H)).   Medical History: Past Medical History:  Diagnosis Date   AAA (abdominal aortic aneurysm) ([place])    a.) s/p repair in 2005   Anemia    Anginal pain ([place])    Anxiety    Arthritis    B12 deficiency    Bilateral cataracts    a.) s/p BILATERAL extractions in 2018   BPH with obstruction/lower urinary tract symptoms    CAD (coronary artery disease)    Carotid artery stenosis    a.) s/p CEA on the RIGHT   COPD (chronic obstructive pulmonary disease) (HCC)    Diastolic dysfunction 58/85/0277   a.)  TTE 07/29/2019: EF 50-55%; LA mildly  enlarged; G1DD.   DOE (dyspnea on exertion)    Elevated PSA    Environmental and seasonal allergies    History of 2019 novel coronavirus disease (COVID-19)    History of kidney stones    HLD (hyperlipidemia)    HOH (hard of hearing)    HTN (hypertension)    Incomplete bladder emptying    Macular degeneration    Prostate cancer (HCC)    RBBB (right bundle branch block)  Valvular insufficiency    a.) TTE 07/29/2019: LVEF 50-55%; LA mild enlarged; triv AR/PR, mild MR, mod TR.    Medications:  Medications Prior to Admission  Medication Sig Dispense Refill Last Dose   acetaminophen (TYLENOL) 500 MG tablet Take 1-2 tablets (500-1,000 mg total) by mouth every 6 (six) hours as needed for mild pain. 60 tablet 0    Fluticasone-Umeclidin-Vilant (TRELEGY ELLIPTA) 100-62.5-25 MCG/INH AEPB Inhale 2 puffs into the lungs daily.   12/22/2021 at 2300   iron polysaccharides (NIFEREX) 150 MG capsule Take 150 mg by mouth 2 (two) times daily.   unknown at unknown   LORazepam (ATIVAN) 0.5 MG tablet Take 0.5 mg by mouth daily as needed for anxiety.   prn at prn   losartan (COZAAR) 100 MG tablet Take 100 mg by mouth daily.    12/22/2021 at 2300   tamsulosin (FLOMAX) 0.4 MG CAPS capsule Take 1 capsule (0.4 mg total) by mouth daily. 30 capsule 0 12/22/2021 at 1100   apixaban (ELIQUIS) 2.5 MG TABS tablet Take 1 tablet (2.5 mg total) by mouth 2 (two) times daily. (Patient not taking: Reported on 12/23/2021) 30 tablet 0 Not Taking   atorvastatin (LIPITOR) 40 MG tablet Take 40 mg by mouth daily. (Patient not taking: Reported on 12/23/2021)   Not Taking   iron polysaccharides (NIFEREX) 150 MG capsule Take 150 mg by mouth daily. (Patient not taking: Reported on 12/23/2021)   Not Taking   ondansetron (ZOFRAN) 4 MG tablet Take 1 tablet (4 mg total) by mouth every 6 (six) hours as needed for nausea. (Patient not taking: Reported on 12/23/2021) 30 tablet 0 Not Taking    PTA anticoagulation: Eliquis 5 mg PO BID PTA.  Patient's Wife  states he hasn't taken Eliquis since this past April.   Assessment: Pharmacy consulted to dose heparin in this 88 year old male admitted with ACS/NSTEMI vs demand ischemia. Troponins peaked at [place]. BNP also elevated this admission.   Hgb low at 9.6 > 8.0. No note of bleeding per my chart review. PMH of anemia with baseline hgb 9-10. Plts wnl.   CrCl = 38.2 ml/min   Goal of Therapy:  Heparin level 0.3-0.7 units/ml aPTT 66 - 102 seconds Monitor platelets by anticoagulation protocol: Yes   Date Time HL Rate/Comment 7/08 0140 <0.10 7/08 1418 0.33 Therapeutic x1 7/08 2147 0.28 Subtherapeutic 7/9 0655 0.41 Therapeutic x1 7/9 1455 0.27 Subtherapeutic 7/10     0024    0.40     Therapeutic X 1  7/10 0741 0.46 Therapeutic x 2  Plan: 7/10:  HL @ 0741 = 0.46, therapeutic X 2 Continue heparin 1,150 units/hr.  Will move to daily heparin levels. CBC and heparin level daily.  Follow up cardiology plans for heparin.  Wynelle Cleveland 12/25/2021,8:17 AM

## 2021-12-25 NOTE — Evaluation (Signed)
Physical Therapy Evaluation Patient Details Name: Ruben Green MRN: [id number] DOB: 09-Oct-1933 Today's Date: 12/25/2021  History of Present Illness  Ruben Green is a 88 y.o. male with medical history significant for CAD, COPD, HTN, prostate cancer, AAA, history of postoperative hypoxia s/p hip replacement in February 2023, hospitalized in February 2023 with sepsis secondary to pneumonia and some concern for CHF with echo showing grade 1 DD, discharged home on 3 L oxygen who presents to the ED by EMS in acute respiratory distress that started during the afternoon hours with O2 sats 88% on room air.    Clinical Impression  Pt received upright in standing as part of hand-off from OT.  Pt's wife translated during session.  Pt able to ambulate without AD around the nursing station with good technique.  Pt does demonstrate slight antalgic gait pattern when ambulating, which he attributes to the R THA he had ~5 mo ago.  Pt does tend to furniture surf while in room, but when in an open hallway, he is able to maintain balance with good technique.  Pt ambulates well overall and both pt and spouse agree that pt does not need therapy following d/c from the hospital.  Pt will continue to be monitored throughout hospital stay and participate in therapy to improve overall strength and balance necessary for safe d/c home.     Recommendations for follow up therapy are one component of a multi-disciplinary discharge planning process, led by the attending physician.  Recommendations may be updated based on patient status, additional functional criteria and insurance authorization.  Follow Up Recommendations No PT follow up      Assistance Recommended at Discharge PRN  Patient can return home with the following  A little help with walking and/or transfers;A little help with bathing/dressing/bathroom    Equipment Recommendations None recommended by PT  Recommendations for Other Services        Functional Status Assessment Patient has had a recent decline in their functional status and demonstrates the ability to make significant improvements in function in a reasonable and predictable amount of time.     Precautions / Restrictions Precautions Precautions: Fall Restrictions Weight Bearing Restrictions: No      Mobility  Bed Mobility               General bed mobility comments: pt upright with OT upon arriving to room.    Transfers Overall transfer level: Needs assistance Equipment used: None Transfers: Sit to/from Stand Sit to Stand: Supervision           General transfer comment: pt moves well without AD.    Ambulation/Gait Ambulation/Gait assistance: Modified independent (Device/Increase time) Gait Distance (Feet): 160 Feet Assistive device: None Gait Pattern/deviations: Step-through pattern, Decreased stride length Gait velocity: decreased     General Gait Details: pt tends to "furniture surf" at times, however in hallway demonstrates good mobility with slow, steady cadence.  Pt does have slight antalgic gait pattern from prior hip surgery (5 mo ago).  Stairs            Wheelchair Mobility    Modified Rankin (Stroke Patients Only)       Balance Overall balance assessment: Needs assistance Sitting-balance support: Feet supported, No upper extremity supported Sitting balance-Leahy Scale: Good     Standing balance support: Bilateral upper extremity supported Standing balance-Leahy Scale: Fair  Pertinent Vitals/Pain Pain Assessment Pain Assessment: No/denies pain    Home Living Family/patient expects to be discharged to:: Private residence Living Arrangements: Spouse/significant other Available Help at Discharge: Family Type of Home: House Home Access: Stairs to enter   Technical brewer of Steps: 2     Home Equipment: Conservation officer, nature (2 wheels)      Prior Function Prior  Level of Function : Independent/Modified Independent             Mobility Comments: RW during previous THA, has not used recently       Hand Dominance   Dominant Hand: Right    Extremity/Trunk Assessment   Upper Extremity Assessment Upper Extremity Assessment: Overall WFL for tasks assessed    Lower Extremity Assessment Lower Extremity Assessment: Generalized weakness       Communication   Communication: HOH;Prefers language other than English (refused interpreter, wife interprets as needed)  Cognition Arousal/Alertness: Awake/alert Behavior During Therapy: WFL for tasks assessed/performed Overall Cognitive Status: Within Functional Limits for tasks assessed                                          General Comments General comments (skin integrity, edema, etc.): SpO2 91-94% on 3L [place] during ambulation    Exercises     Assessment/Plan    PT Assessment Patient needs continued PT services  PT Problem List Decreased strength;Decreased activity tolerance;Decreased balance;Decreased mobility       PT Treatment Interventions DME instruction;Gait training;Functional mobility training;Therapeutic activities;Therapeutic exercise;Balance training;Neuromuscular re-education    PT Goals (Current goals can be found in the Care Plan section)  Acute Rehab PT Goals Patient Stated Goal: to get back home. PT Goal Formulation: With patient/family Time For Goal Achievement: 01/08/22 Potential to Achieve Goals: Good    Frequency Min 2X/week     Co-evaluation               AM-PAC PT "6 Clicks" Mobility  Outcome Measure Help needed turning from your back to your side while in a flat bed without using bedrails?: None Help needed moving from lying on your back to sitting on the side of a flat bed without using bedrails?: None Help needed moving to and from a bed to a chair (including a wheelchair)?: None Help needed standing up from a chair using your  arms (e.g., wheelchair or bedside chair)?: A Little Help needed to walk in hospital room?: A Little Help needed climbing 3-5 steps with a railing? : A Little 6 Click Score: 21    End of Session Equipment Utilized During Treatment: Gait belt Activity Tolerance: Patient tolerated treatment well Patient left: in bed;with call bell/phone within reach Nurse Communication: Mobility status PT Visit Diagnosis: Unsteadiness on feet (R26.81);Muscle weakness (generalized) (M62.81)    Time: 9470-9628 PT Time Calculation (min) (ACUTE ONLY): 30 min   Charges:   PT Evaluation $PT Eval Low Complexity: 1 Low PT Treatments $Therapeutic Activity: 8-22 mins        Gwenlyn Saran, PT, DPT 12/25/21, 5:00 PM   Christie Nottingham 12/25/2021, 4:57 PM

## 2021-12-25 NOTE — Progress Notes (Signed)
[facility] Cardiology  CARDIOLOGY CONSULT NOTE  Patient ID: NOLYN SWAB MRN: [id number] DOB/AGE: 86-Feb-1935 86 y.o.  Admit date: 12/23/2021 Referring Physician Nolberto Hanlon Primary Physician Tracie Harrier, MD Primary Cardiologist Serafina Royals Reason for Consultation Elevated troponin, sepsis  HPI:  [name] is an 88 year old male with history of carotid stenosis s/p right CEA 2021, AAA s/p repair 2005, coronary atherosclerosis (reported cardiac cath several years ago), hypertension who presents with shortness of breath, cough, fever.  Cardiology is consulted for elevated troponin.  Interval history: - O2 requirement down to 3L  - productive cough remains, no chest pain. Has not been out of bed. - renal function slightly improving, remains anemic with hgb 8.0  - echo resulted with low normal LVEF 50-55%, g1dd - slightly decreased from prior study in February 2023 of 60-65%  Review of systems complete and found to be negative unless listed above     Past Medical History:  Diagnosis Date   AAA (abdominal aortic aneurysm) ([place])    a.) s/p repair in 2005   Anemia    Anginal pain ([place])    Anxiety    Arthritis    B12 deficiency    Bilateral cataracts    a.) s/p BILATERAL extractions in 2018   BPH with obstruction/lower urinary tract symptoms    CAD (coronary artery disease)    Carotid artery stenosis    a.) s/p CEA on the RIGHT   COPD (chronic obstructive pulmonary disease) ([place])    Diastolic dysfunction 55/73/2202   a.)  TTE 07/29/2019: EF 50-55%; LA mildly enlarged; G1DD.   DOE (dyspnea on exertion)    Elevated PSA    Environmental and seasonal allergies    History of 2019 novel coronavirus disease (COVID-19)    History of kidney stones    HLD (hyperlipidemia)    HOH (hard of hearing)    HTN (hypertension)    Incomplete bladder emptying    Macular degeneration    Prostate cancer (HCC)    RBBB (right bundle branch block)    Valvular insufficiency    a.) TTE  07/29/2019: LVEF 50-55%; LA mild enlarged; triv AR/PR, mild MR, mod TR.    Past Surgical History:  Procedure Laterality Date   ABDOMINAL AORTIC ANEURYSM REPAIR     CATARACT EXTRACTION W/PHACO Right 09/25/2016   Procedure: CATARACT EXTRACTION PHACO AND INTRAOCULAR LENS PLACEMENT (IOC);  Surgeon: Birder Robson, MD;  Location: [facility] ORS;  Service: Ophthalmology;  Laterality: Right;  [place] 01:01 AP% 17.4 CDE 10.75 Fluid pack lot # [id number]   CATARACT EXTRACTION W/PHACO Left 10/16/2016   Procedure: CATARACT EXTRACTION PHACO AND INTRAOCULAR LENS PLACEMENT ([place]) Suture placed in Left eye;  Surgeon: Birder Robson, MD;  Location: [facility] ORS;  Service: Ophthalmology;  Laterality: Left;  [place] 2:06.8 AP% 22.2 CDE 28.17 Fluid pack lot # [id number]   COLONOSCOPY WITH PROPOFOL N/A 03/31/2019   Procedure: COLONOSCOPY WITH PROPOFOL;  Surgeon: Lollie Sails, MD;  Location: [facility] ENDOSCOPY;  Service: Endoscopy;  Laterality: N/A;   COLONOSCOPY WITH PROPOFOL N/A 09/09/2020   Procedure: COLONOSCOPY WITH PROPOFOL;  Surgeon: Lesly Rubenstein, MD;  Location: [facility] ENDOSCOPY;  Service: Endoscopy;  Laterality: N/A;   CYSTOSCOPY W/ RETROGRADES Bilateral 02/23/2020   Procedure: CYSTOSCOPY WITH RETROGRADE PYELOGRAM;  Surgeon: Abbie Sons, MD;  Location: [facility] ORS;  Service: Urology;  Laterality: Bilateral;   CYSTOSCOPY WITH BIOPSY N/A 02/23/2020   Procedure: CYSTOSCOPY WITH BIOPSY;  Surgeon: Abbie Sons, MD;  Location: [facility] ORS;  Service: Urology;  Laterality:  N/A;   EYE SURGERY     HERNIA REPAIR     x 2   INGUINAL HERNIA REPAIR Right 12/02/2015   Procedure: HERNIA REPAIR INGUINAL ADULT;  Surgeon: Leonie Green, MD;  Location: [facility] ORS;  Service: General;  Laterality: Right;   PROSTATE SURGERY  2012   Right Carotid Endarterectomy     TOTAL HIP ARTHROPLASTY Right 08/01/2021   Procedure: TOTAL HIP ARTHROPLASTY;  Surgeon: Corky Mull, MD;  Location: [facility] ORS;  Service: Orthopedics;  Laterality: Right;     Medications Prior to Admission  Medication Sig Dispense Refill Last Dose   acetaminophen (TYLENOL) 500 MG tablet Take 1-2 tablets (500-1,000 mg total) by mouth every 6 (six) hours as needed for mild pain. 60 tablet 0    Fluticasone-Umeclidin-Vilant (TRELEGY ELLIPTA) 100-62.5-25 MCG/INH AEPB Inhale 2 puffs into the lungs daily.   12/22/2021 at 2300   iron polysaccharides (NIFEREX) 150 MG capsule Take 150 mg by mouth 2 (two) times daily.   unknown at unknown   LORazepam (ATIVAN) 0.5 MG tablet Take 0.5 mg by mouth daily as needed for anxiety.   prn at prn   losartan (COZAAR) 100 MG tablet Take 100 mg by mouth daily.    12/22/2021 at 2300   tamsulosin (FLOMAX) 0.4 MG CAPS capsule Take 1 capsule (0.4 mg total) by mouth daily. 30 capsule 0 12/22/2021 at 1100   apixaban (ELIQUIS) 2.5 MG TABS tablet Take 1 tablet (2.5 mg total) by mouth 2 (two) times daily. (Patient not taking: Reported on 12/23/2021) 30 tablet 0 Not Taking   atorvastatin (LIPITOR) 40 MG tablet Take 40 mg by mouth daily. (Patient not taking: Reported on 12/23/2021)   Not Taking   iron polysaccharides (NIFEREX) 150 MG capsule Take 150 mg by mouth daily. (Patient not taking: Reported on 12/23/2021)   Not Taking   ondansetron (ZOFRAN) 4 MG tablet Take 1 tablet (4 mg total) by mouth every 6 (six) hours as needed for nausea. (Patient not taking: Reported on 12/23/2021) 30 tablet 0 Not Taking    Social History   Socioeconomic History   Marital status: Married    Spouse name: Not on file   Number of children: Not on file   Years of education: Not on file   Highest education level: Not on file  Occupational History   Not on file  Tobacco Use   Smoking status: Former    Types: Cigarettes    Quit date: 11/30/1998    Years since quitting: 23.0   Smokeless tobacco: Former   Tobacco comments:    quit 10 years ago  Vaping Use   Vaping Use: Never used  Substance and Sexual Activity   Alcohol use: Not Currently    Comment: occasional   Drug use: No    Sexual activity: Not on file  Other Topics Concern   Not on file  Social History Narrative   Not on file   Social Determinants of Health   Financial Resource Strain: Not on file  Food Insecurity: Not on file  Transportation Needs: Not on file  Physical Activity: Not on file  Stress: Not on file  Social Connections: Not on file  Intimate Partner Violence: Not on file    Family History  Problem Relation Age of Onset   Cancer Brother        2000   Prostate cancer Neg Hx    Bladder Cancer Neg Hx    Kidney cancer Neg Hx  PHYSICAL EXAM  General: Elderly male, sitting upright in hospital bed with his son at bedside. HEENT:  Normocephalic and atraumatic Neck:  No JVD.  Lungs: Normal respiratory effort on 3 L by [place].  Decreased breath sounds in the bases with crackles bilaterally Heart: HRRR . Normal S1 and S2 without gallops or murmurs.  Abdomen: Nondistended appearing  Msk: Normal strength and tone for age. Extremities: No clubbing, cyanosis or edema.   Neuro: Alert and oriented X 3. Psych:  Good affect, responds appropriately  Labs:   Lab Results  Component Value Date   WBC 7.8 12/25/2021   HGB 8.0 (L) 12/25/2021   HCT 26.0 (L) 12/25/2021   MCV 86.7 12/25/2021   PLT 195 12/25/2021    Recent Labs  Lab 12/23/21 0140 12/24/21 0526 12/25/21 0411  NA 142   < > 136  K 5.3*   < > 4.9  CL 113*   < > 105  CO2 23   < > 21*  BUN 23   < > 48*  CREATININE 1.25*   < > 1.50*  CALCIUM 8.5*   < > 8.1*  PROT 6.6  --   --   BILITOT 0.7  --   --   ALKPHOS 73  --   --   ALT 25  --   --   AST 28  --   --   GLUCOSE 164*   < > 168*   < > = values in this interval not displayed.    Lab Results  Component Value Date   TROPONINI <0.03 10/19/2016   No results found for: "CHOL" No results found for: "HDL" No results found for: "LDLCALC" No results found for: "TRIG" No results found for: "CHOLHDL" No results found for: "LDLDIRECT"    Radiology: ECHOCARDIOGRAM  COMPLETE  Result Date: 12/24/2021    ECHOCARDIOGRAM REPORT   Patient Name:   DRAPER GALLON Date of Exam: 12/24/2021 Medical Rec #:  [id number]           Height:       62.0 in Accession #:    [id number]          Weight:       170.0 lb Date of Birth:  [birth year]           BSA:          1.784 m Patient Age:    31 years            BP:           141/77 mmHg Patient Gender: M                   HR:           88 bpm. Exam Location:  [facility] Procedure: 2D Echo and Intracardiac Opacification Agent Indications:     NSTEMI I21.4  History:         Patient has prior history of Echocardiogram examinations, most                  recent 08/10/2021.  Sonographer:     Kathlen Brunswick RDCS Referring Phys:  [id number] Athena Masse Diagnosing Phys: Donnelly Angelica  Sonographer Comments: Technically difficult study due to poor echo windows and suboptimal apical window. Image acquisition challenging due to patient body habitus. IMPRESSIONS  1. Left ventricular ejection fraction, by estimation, is 50 to 55%. The left ventricle has low normal function. The left ventricle has no regional wall motion abnormalities.  The left ventricular internal cavity size was mildly dilated. Left ventricular diastolic parameters are consistent with Grade I diastolic dysfunction (impaired relaxation).  2. Right ventricular systolic function is normal. The right ventricular size is normal.  3. The mitral valve is grossly normal. Trivial mitral valve regurgitation.  4. The aortic valve is grossly normal. Aortic valve regurgitation is not visualized. No aortic stenosis is present.  5. The inferior vena cava is normal in size with greater than 50% respiratory variability, suggesting right atrial pressure of 3 mmHg. Conclusion(s)/Recommendation(s): Technically difficult study. Compared to prior study, EF appears decreased, though quantification is difficult in setting of difficult imaging. FINDINGS  Left Ventricle: Left ventricular ejection fraction, by estimation, is  50 to 55%. The left ventricle has low normal function. The left ventricle has no regional wall motion abnormalities. Definity contrast agent was given IV to delineate the left ventricular endocardial borders. The left ventricular internal cavity size was mildly dilated. There is no left ventricular hypertrophy. Left ventricular diastolic parameters are consistent with Grade I diastolic dysfunction (impaired relaxation). Right Ventricle: The right ventricular size is normal. Right vetricular wall thickness was not well visualized. Right ventricular systolic function is normal. Left Atrium: Left atrial size was normal in size. Right Atrium: Right atrial size was not well visualized. Pericardium: There is no evidence of pericardial effusion. Mitral Valve: The mitral valve is grossly normal. Trivial mitral valve regurgitation. Tricuspid Valve: The tricuspid valve is grossly normal. Tricuspid valve regurgitation is trivial. Aortic Valve: The aortic valve is grossly normal. Aortic valve regurgitation is not visualized. No aortic stenosis is present. Aortic valve peak gradient measures 6.0 mmHg. Pulmonic Valve: The pulmonic valve was not well visualized. Aorta: The aortic root is normal in size and structure. Venous: The inferior vena cava is normal in size with greater than 50% respiratory variability, suggesting right atrial pressure of 3 mmHg. IAS/Shunts: No atrial level shunt detected by color flow Doppler.  LEFT VENTRICLE PLAX 2D LVIDd:         6.08 cm   Diastology LVIDs:         4.42 cm   LV e' medial:    6.53 cm/s LV PW:         1.09 cm   LV E/e' medial:  10.0 LV IVS:        1.10 cm   LV e' lateral:   4.57 cm/s LVOT diam:     2.00 cm   LV E/e' lateral: 14.4 LV SV:         63 LV SV Index:   35 LVOT Area:     3.14 cm  RIGHT VENTRICLE RV Basal diam:  3.10 cm RV S prime:     11.40 cm/s TAPSE (M-mode): 1.7 cm LEFT ATRIUM           Index        RIGHT ATRIUM           Index LA diam:      5.00 cm 2.80 cm/m   RA Area:      13.10 cm LA Vol (A4C): 42.2 ml 23.65 ml/m  RA Volume:   29.60 ml  16.59 ml/m  AORTIC VALVE                 PULMONIC VALVE AV Area (Vmax): 2.30 cm     PV Vmax:       0.73 m/s AV Vmax:        122.00 cm/s  PV Peak grad:  2.1 mmHg AV Peak Grad:   6.0 mmHg LVOT Vmax:      89.20 cm/s LVOT Vmean:     53.400 cm/s LVOT VTI:       0.200 m  AORTA Ao Root diam: 3.30 cm MITRAL VALVE MV Area (PHT): 5.27 cm     SHUNTS MV Decel Time: 144 msec     Systemic VTI:  0.20 m MV E velocity: 65.60 cm/s   Systemic Diam: 2.00 cm MV A velocity: 115.00 cm/s MV E/A ratio:  0.57 Donnelly Angelica Electronically signed by Donnelly Angelica Signature Date/Time: 12/24/2021/1:04:46 PM    Final    DG Chest Port 1 View  Result Date: 12/23/2021 CLINICAL DATA:  Shortness of breath EXAM: PORTABLE CHEST 1 VIEW COMPARISON:  08/09/2021 FINDINGS: Cardiac shadow is within normal limits. Aortic calcifications are again seen. Lungs are well aerated bilaterally. Mild vascular congestion is noted with interstitial edema. No bony abnormality is noted. IMPRESSION: Mild changes of CHF. Electronically Signed   By: Inez Catalina M.D.   On: 12/23/2021 02:01    EKG: Sinus tachycardia. RBBB.   ASSESSMENT AND PLAN:  Ruben Green is an 88 year old male with history of carotid stenosis s/p right CEA 2021, AAA s/p repair 2005, coronary atherosclerosis (reported cardiac cath several years ago), hypertension who presents with shortness of breath, cough, fever.  Cardiology is consulted for elevated troponin.  # Elevated troponin # Sepsis from likely pneumonia Patient has had cough and fever for a few days followed by respiratory distress on 12/22/2021 prompting his presentation.  In the setting he was discovered to be febrile and tachycardic.  Troponin increased to peak of 1969 without reported chest pain.  He was hypoxic on 12 L high flow nasal cannula on admission, weaned to 3 L today, with mild pulmonary infiltrates on chest x-ray out of proportion to level of hypoxia.  I  suspect his troponin elevation is demand in the setting of pneumonia, though it is impossible to rule out acute coronary syndrome at this time.  EKG shows right bundle branch block without obvious acute ischemic changes. -Continue to treat underlying infection as you are doing - Continue 81 mg indefinitely -S/p heparin infusion x48 hours (ended early this morning) -Continue to lasix and home losartan 100 mg daily in setting of worsening renal function. -Initiate metoprolol XL 12.5 mg daily with goals to uptitrate -Monitor patient on telemetry while inpatient - Echocardiogram resulted with low normal LVEF, slightly decreased compared to prior study from February -Pending clinical course we will determine need for ischemic evaluation and timing. Renal function is worsening, actively infected, and chronic severe anemia are all relative contraindications at this time.   This patient's plan of care was discussed and created with Dr. Corky Sox and he is in agreement.    Signed: Tristan Schroeder, PA-C 12/25/2021, 9:51 AM

## 2021-12-26 ENCOUNTER — Inpatient Hospital Stay: Payer: PPO

## 2021-12-26 DIAGNOSIS — J9601 Acute respiratory failure with hypoxia: Secondary | ICD-10-CM | POA: Diagnosis not present

## 2021-12-26 LAB — CBC
HCT: 28.2 % — ABNORMAL LOW (ref 39.0–52.0)
Hemoglobin: 8.5 g/dL — ABNORMAL LOW (ref 13.0–17.0)
MCH: 26.1 pg (ref 26.0–34.0)
MCHC: 30.1 g/dL (ref 30.0–36.0)
MCV: 86.5 fL (ref 80.0–100.0)
Platelets: 231 10*3/uL (ref 150–400)
RBC: 3.26 MIL/uL — ABNORMAL LOW (ref 4.22–5.81)
RDW: 16.3 % — ABNORMAL HIGH (ref 11.5–15.5)
WBC: 9 10*3/uL (ref 4.0–10.5)
nRBC: 0 % (ref 0.0–0.2)

## 2021-12-26 LAB — BASIC METABOLIC PANEL
Anion gap: 10 (ref 5–15)
BUN: 53 mg/dL — ABNORMAL HIGH (ref 8–23)
CO2: 22 mmol/L (ref 22–32)
Calcium: 8.8 mg/dL — ABNORMAL LOW (ref 8.9–10.3)
Chloride: 107 mmol/L (ref 98–111)
Creatinine, Ser: 1.34 mg/dL — ABNORMAL HIGH (ref 0.61–1.24)
GFR, Estimated: 52 mL/min — ABNORMAL LOW (ref >60)
Glucose, Bld: 181 mg/dL — ABNORMAL HIGH (ref 70–99)
Potassium: 4.6 mmol/L (ref 3.5–5.1)
Sodium: 139 mmol/L (ref 135–145)

## 2021-12-26 MED ORDER — METOPROLOL SUCCINATE ER 50 MG PO TB24
50.0000 mg | ORAL_TABLET | Freq: Every day | ORAL | Status: DC
Start: 2021-12-27 — End: 2021-12-28
  Administered 2021-12-27 – 2021-12-28 (×2): 50 mg via ORAL
  Filled 2021-12-26 (×2): qty 1

## 2021-12-26 MED ORDER — POLYETHYLENE GLYCOL 3350 17 G PO PACK
17.0000 g | PACK | Freq: Every day | ORAL | Status: DC
  Administered 2021-12-26 – 2021-12-28 (×3): 17 g via ORAL
  Filled 2021-12-26 (×3): qty 1

## 2021-12-26 MED ORDER — AMOXICILLIN-POT CLAVULANATE 875-125 MG PO TABS
1.0000 | ORAL_TABLET | Freq: Two times a day (BID) | ORAL | Status: DC
  Administered 2021-12-26 – 2021-12-28 (×4): 1 via ORAL
  Filled 2021-12-26 (×4): qty 1

## 2021-12-26 MED ORDER — SENNOSIDES-DOCUSATE SODIUM 8.6-50 MG PO TABS
1.0000 | ORAL_TABLET | Freq: Two times a day (BID) | ORAL | Status: DC
  Administered 2021-12-26 – 2021-12-28 (×4): 1 via ORAL
  Filled 2021-12-26 (×5): qty 1

## 2021-12-26 MED ORDER — METHYLPREDNISOLONE SODIUM SUCC 40 MG IJ SOLR
40.0000 mg | Freq: Every day | INTRAMUSCULAR | Status: DC
  Administered 2021-12-27: 40 mg via INTRAVENOUS
  Filled 2021-12-26: qty 1

## 2021-12-26 MED ORDER — METOPROLOL SUCCINATE ER 25 MG PO TB24
25.0000 mg | ORAL_TABLET | Freq: Two times a day (BID) | ORAL | Status: AC
Start: 2021-12-26 — End: 2021-12-26
  Administered 2021-12-26: 25 mg via ORAL
  Filled 2021-12-26: qty 1

## 2021-12-26 MED ORDER — ENOXAPARIN SODIUM 40 MG/0.4ML IJ SOSY
40.0000 mg | PREFILLED_SYRINGE | INTRAMUSCULAR | Status: DC
Start: 2021-12-26 — End: 2021-12-28
  Administered 2021-12-26 – 2021-12-27 (×2): 40 mg via SUBCUTANEOUS
  Filled 2021-12-26 (×2): qty 0.4

## 2021-12-26 NOTE — Progress Notes (Signed)
[facility] Cardiology  CARDIOLOGY CONSULT NOTE  Patient ID: Ruben Green MRN: [id number] DOB/AGE: October 11, 1933 88 y.o.  Admit date: 12/23/2021 Referring Physician Nolberto Hanlon Primary Physician Tracie Harrier, MD Primary Cardiologist Serafina Royals Reason for Consultation Elevated troponin, sepsis  HPI:  Ruben Green is an 88 year old male with history of carotid stenosis s/p right CEA 2021, AAA s/p repair 2005, coronary atherosclerosis (reported cardiac cath several years ago), hypertension who presents with shortness of breath, cough, fever.  Cardiology is consulted for elevated troponin.  Interval history: -Continues to feel better, cough is improving, oxygen weaned to 2 L -Renal function improving as losartan and Lasix are held -no chest pain, very eager to go home  Review of systems complete and found to be negative unless listed above     Past Medical History:  Diagnosis Date   AAA (abdominal aortic aneurysm) ([place])    a.) s/p repair in 2005   Anemia    Anginal pain ([place])    Anxiety    Arthritis    B12 deficiency    Bilateral cataracts    a.) s/p BILATERAL extractions in 2018   BPH with obstruction/lower urinary tract symptoms    CAD (coronary artery disease)    Carotid artery stenosis    a.) s/p CEA on the RIGHT   COPD (chronic obstructive pulmonary disease) ([place])    Diastolic dysfunction 62/70/3500   a.)  TTE 07/29/2019: EF 50-55%; LA mildly enlarged; G1DD.   DOE (dyspnea on exertion)    Elevated PSA    Environmental and seasonal allergies    History of 2019 novel coronavirus disease (COVID-19)    History of kidney stones    HLD (hyperlipidemia)    HOH (hard of hearing)    HTN (hypertension)    Incomplete bladder emptying    Macular degeneration    Prostate cancer (HCC)    RBBB (right bundle branch block)    Valvular insufficiency    a.) TTE 07/29/2019: LVEF 50-55%; LA mild enlarged; triv AR/PR, mild MR, mod TR.    Past Surgical History:  Procedure  Laterality Date   ABDOMINAL AORTIC ANEURYSM REPAIR     CATARACT EXTRACTION W/PHACO Right 09/25/2016   Procedure: CATARACT EXTRACTION PHACO AND INTRAOCULAR LENS PLACEMENT (IOC);  Surgeon: Birder Robson, MD;  Location: [facility] ORS;  Service: Ophthalmology;  Laterality: Right;  [place] 01:01 AP% 17.4 CDE 10.75 Fluid pack lot # [id number]   CATARACT EXTRACTION W/PHACO Left 10/16/2016   Procedure: CATARACT EXTRACTION PHACO AND INTRAOCULAR LENS PLACEMENT ([facility]) Suture placed in Left eye;  Surgeon: Birder Robson, MD;  Location: [facility] ORS;  Service: Ophthalmology;  Laterality: Left;  [place] 2:06.8 AP% 22.2 CDE 28.17 Fluid pack lot # [id number]   COLONOSCOPY WITH PROPOFOL N/A 03/31/2019   Procedure: COLONOSCOPY WITH PROPOFOL;  Surgeon: Lollie Sails, MD;  Location: [facility] ENDOSCOPY;  Service: Endoscopy;  Laterality: N/A;   COLONOSCOPY WITH PROPOFOL N/A 09/09/2020   Procedure: COLONOSCOPY WITH PROPOFOL;  Surgeon: Lesly Rubenstein, MD;  Location: [facility] ENDOSCOPY;  Service: Endoscopy;  Laterality: N/A;   CYSTOSCOPY W/ RETROGRADES Bilateral 02/23/2020   Procedure: CYSTOSCOPY WITH RETROGRADE PYELOGRAM;  Surgeon: Abbie Sons, MD;  Location: [facility] ORS;  Service: Urology;  Laterality: Bilateral;   CYSTOSCOPY WITH BIOPSY N/A 02/23/2020   Procedure: CYSTOSCOPY WITH BIOPSY;  Surgeon: Abbie Sons, MD;  Location: [facility] ORS;  Service: Urology;  Laterality: N/A;   EYE SURGERY     HERNIA REPAIR     x 2   INGUINAL HERNIA REPAIR  Right 12/02/2015   Procedure: HERNIA REPAIR INGUINAL ADULT;  Surgeon: Leonie Green, MD;  Location: [facility] ORS;  Service: General;  Laterality: Right;   PROSTATE SURGERY  2012   Right Carotid Endarterectomy     TOTAL HIP ARTHROPLASTY Right 08/01/2021   Procedure: TOTAL HIP ARTHROPLASTY;  Surgeon: Corky Mull, MD;  Location: [facility] ORS;  Service: Orthopedics;  Laterality: Right;    Medications Prior to Admission  Medication Sig Dispense Refill Last Dose   acetaminophen (TYLENOL) 500 MG tablet  Take 1-2 tablets (500-1,000 mg total) by mouth every 6 (six) hours as needed for mild pain. 60 tablet 0    Fluticasone-Umeclidin-Vilant (TRELEGY ELLIPTA) 100-62.5-25 MCG/INH AEPB Inhale 2 puffs into the lungs daily.   12/22/2021 at 2300   iron polysaccharides (NIFEREX) 150 MG capsule Take 150 mg by mouth 2 (two) times daily.   unknown at unknown   LORazepam (ATIVAN) 0.5 MG tablet Take 0.5 mg by mouth daily as needed for anxiety.   prn at prn   losartan (COZAAR) 100 MG tablet Take 100 mg by mouth daily.    12/22/2021 at 2300   tamsulosin (FLOMAX) 0.4 MG CAPS capsule Take 1 capsule (0.4 mg total) by mouth daily. 30 capsule 0 12/22/2021 at 1100   apixaban (ELIQUIS) 2.5 MG TABS tablet Take 1 tablet (2.5 mg total) by mouth 2 (two) times daily. (Patient not taking: Reported on 12/23/2021) 30 tablet 0 Not Taking   atorvastatin (LIPITOR) 40 MG tablet Take 40 mg by mouth daily. (Patient not taking: Reported on 12/23/2021)   Not Taking   iron polysaccharides (NIFEREX) 150 MG capsule Take 150 mg by mouth daily. (Patient not taking: Reported on 12/23/2021)   Not Taking   ondansetron (ZOFRAN) 4 MG tablet Take 1 tablet (4 mg total) by mouth every 6 (six) hours as needed for nausea. (Patient not taking: Reported on 12/23/2021) 30 tablet 0 Not Taking    Social History   Socioeconomic History   Marital status: Married    Spouse name: Not on file   Number of children: Not on file   Years of education: Not on file   Highest education level: Not on file  Occupational History   Not on file  Tobacco Use   Smoking status: Former    Types: Cigarettes    Quit date: 11/30/1998    Years since quitting: 23.0   Smokeless tobacco: Former   Tobacco comments:    quit 10 years ago  Vaping Use   Vaping Use: Never used  Substance and Sexual Activity   Alcohol use: Not Currently    Comment: occasional   Drug use: No   Sexual activity: Not on file  Other Topics Concern   Not on file  Social History Narrative   Not on file    Social Determinants of Health   Financial Resource Strain: Not on file  Food Insecurity: Not on file  Transportation Needs: Not on file  Physical Activity: Not on file  Stress: Not on file  Social Connections: Not on file  Intimate Partner Violence: Not on file    Family History  Problem Relation Age of Onset   Cancer Brother        2000   Prostate cancer Neg Hx    Bladder Cancer Neg Hx    Kidney cancer Neg Hx      PHYSICAL EXAM  General: Elderly male, sitting upright in hospital bed with wife at bedside HEENT:  Normocephalic and atraumatic Neck:  No JVD.  Lungs: Normal respiratory effort on 2 L by [place].  Decreased breath sounds in the bases with crackles bilaterally Heart: HRRR . Normal S1 and S2 without gallops or murmurs.  Abdomen: Nondistended appearing  Msk: Normal strength and tone for age. Extremities: No clubbing, cyanosis or edema.   Neuro: Alert and oriented X 3. Psych:  Good affect, responds appropriately  Labs:   Lab Results  Component Value Date   WBC 9.0 12/26/2021   HGB 8.5 (L) 12/26/2021   HCT 28.2 (L) 12/26/2021   MCV 86.5 12/26/2021   PLT 231 12/26/2021    Recent Labs  Lab 12/23/21 0140 12/24/21 0526 12/26/21 0833  NA 142   < > 139  K 5.3*   < > 4.6  CL 113*   < > 107  CO2 23   < > 22  BUN 23   < > 53*  CREATININE 1.25*   < > 1.34*  CALCIUM 8.5*   < > 8.8*  PROT 6.6  --   --   BILITOT 0.7  --   --   ALKPHOS 73  --   --   ALT 25  --   --   AST 28  --   --   GLUCOSE 164*   < > 181*   < > = values in this interval not displayed.    Lab Results  Component Value Date   TROPONINI <0.03 10/19/2016   No results found for: "CHOL" No results found for: "HDL" No results found for: "LDLCALC" No results found for: "TRIG" No results found for: "CHOLHDL" No results found for: "LDLDIRECT"    Radiology: CT Chest High Resolution  Result Date: 12/25/2021 CLINICAL DATA:  Hypoxia EXAM: CT CHEST WITHOUT CONTRAST TECHNIQUE: Multidetector CT  imaging of the chest was performed following the standard protocol without intravenous contrast. High resolution imaging of the lungs, as well as inspiratory and expiratory imaging, was performed. RADIATION DOSE REDUCTION: This exam was performed according to the departmental dose-optimization program which includes automated exposure control, adjustment of the mA and/or kV according to patient size and/or use of iterative reconstruction technique. COMPARISON:  Chest CT dated August 08, 2021 FINDINGS: Cardiovascular: Normal heart size. Trace pericardial fluid. Calcifications of the mitral valve and chordae tendineae. Severe left main, lad and circumflex coronary artery calcifications. Severe atherosclerotic disease of the thoracic aorta. Mediastinum/Nodes: Moderate hiatal hernia. Thyroid is unremarkable. No pathologically enlarged lymph nodes seen in the chest. Lungs/Pleura: Central airways are patent. No significant air trapping. Severe centrilobular emphysema. New bronchial wall thickening of the bilateral lower lobes, ground-glass opacity of the posterior right upper lobe and bilateral lower lobe consolidations. Trace left-greater-than-right pleural effusions. Upper Abdomen: Simple appearing cysts of the left kidney, no further follow-up imaging recommended. Diverticulosis. No acute abnormality. Musculoskeletal: No chest wall mass or suspicious bone lesions identified. IMPRESSION: 1. New bilateral lower lobe consolidations, ground-glass opacity of the posterior right upper lobe and lower lobe bronchial wall thickening, findings can be seen in the setting of infection or aspiration. Recommend follow-up chest CT in 3 months to ensure resolution. 2. No evidence of fibrotic interstitial lung disease. 3. Moderate hiatal hernia. 4. Trace left-greater-than-right pleural effusions. 5. Aortic Atherosclerosis (ICD10-I70.0) and Emphysema (ICD10-J43.9). Electronically Signed   By: Yetta Glassman M.D.   On: 12/25/2021  16:19   ECHOCARDIOGRAM COMPLETE  Result Date: 12/24/2021    ECHOCARDIOGRAM REPORT   Patient Name:   Ruben Green Date of Exam: 12/24/2021 Medical Rec #:  [id number]           Height:       62.0 in Accession #:    [id number]          Weight:       170.0 lb Date of Birth:  Feb 27, 1934           BSA:          1.784 m Patient Age:    23 years            BP:           141/77 mmHg Patient Gender: M                   HR:           88 bpm. Exam Location:  [facility] Procedure: 2D Echo and Intracardiac Opacification Agent Indications:     NSTEMI I21.4  History:         Patient has prior history of Echocardiogram examinations, most                  recent 08/10/2021.  Sonographer:     Kathlen Brunswick RDCS Referring Phys:  [id number] Athena Masse Diagnosing Phys: Ruben Green  Sonographer Comments: Technically difficult study due to poor echo windows and suboptimal apical window. Image acquisition challenging due to patient body habitus. IMPRESSIONS  1. Left ventricular ejection fraction, by estimation, is 50 to 55%. The left ventricle has low normal function. The left ventricle has no regional wall motion abnormalities. The left ventricular internal cavity size was mildly dilated. Left ventricular diastolic parameters are consistent with Grade I diastolic dysfunction (impaired relaxation).  2. Right ventricular systolic function is normal. The right ventricular size is normal.  3. The mitral valve is grossly normal. Trivial mitral valve regurgitation.  4. The aortic valve is grossly normal. Aortic valve regurgitation is not visualized. No aortic stenosis is present.  5. The inferior vena cava is normal in size with greater than 50% respiratory variability, suggesting right atrial pressure of 3 mmHg. Conclusion(s)/Recommendation(s): Technically difficult study. Compared to prior study, EF appears decreased, though quantification is difficult in setting of difficult imaging. FINDINGS  Left Ventricle: Left ventricular ejection  fraction, by estimation, is 50 to 55%. The left ventricle has low normal function. The left ventricle has no regional wall motion abnormalities. Definity contrast agent was given IV to delineate the left ventricular endocardial borders. The left ventricular internal cavity size was mildly dilated. There is no left ventricular hypertrophy. Left ventricular diastolic parameters are consistent with Grade I diastolic dysfunction (impaired relaxation). Right Ventricle: The right ventricular size is normal. Right vetricular wall thickness was not well visualized. Right ventricular systolic function is normal. Left Atrium: Left atrial size was normal in size. Right Atrium: Right atrial size was not well visualized. Pericardium: There is no evidence of pericardial effusion. Mitral Valve: The mitral valve is grossly normal. Trivial mitral valve regurgitation. Tricuspid Valve: The tricuspid valve is grossly normal. Tricuspid valve regurgitation is trivial. Aortic Valve: The aortic valve is grossly normal. Aortic valve regurgitation is not visualized. No aortic stenosis is present. Aortic valve peak gradient measures 6.0 mmHg. Pulmonic Valve: The pulmonic valve was not well visualized. Aorta: The aortic root is normal in size and structure. Venous: The inferior vena cava is normal in size with greater than 50% respiratory variability, suggesting right atrial pressure of 3 mmHg. IAS/Shunts: No atrial level shunt detected by color flow Doppler.  LEFT VENTRICLE PLAX 2D  LVIDd:         6.08 cm   Diastology LVIDs:         4.42 cm   LV e' medial:    6.53 cm/s LV PW:         1.09 cm   LV E/e' medial:  10.0 LV IVS:        1.10 cm   LV e' lateral:   4.57 cm/s LVOT diam:     2.00 cm   LV E/e' lateral: 14.4 LV SV:         63 LV SV Index:   35 LVOT Area:     3.14 cm  RIGHT VENTRICLE RV Basal diam:  3.10 cm RV S prime:     11.40 cm/s TAPSE (M-mode): 1.7 cm LEFT ATRIUM           Index        RIGHT ATRIUM           Index LA diam:      5.00  cm 2.80 cm/m   RA Area:     13.10 cm LA Vol (A4C): 42.2 ml 23.65 ml/m  RA Volume:   29.60 ml  16.59 ml/m  AORTIC VALVE                 PULMONIC VALVE AV Area (Vmax): 2.30 cm     PV Vmax:       0.73 m/s AV Vmax:        122.00 cm/s  PV Peak grad:  2.1 mmHg AV Peak Grad:   6.0 mmHg LVOT Vmax:      89.20 cm/s LVOT Vmean:     53.400 cm/s LVOT VTI:       0.200 m  AORTA Ao Root diam: 3.30 cm MITRAL VALVE MV Area (PHT): 5.27 cm     SHUNTS MV Decel Time: 144 msec     Systemic VTI:  0.20 m MV E velocity: 65.60 cm/s   Systemic Diam: 2.00 cm MV A velocity: 115.00 cm/s MV E/A ratio:  0.57 Ruben Green Electronically signed by Ruben Green Signature Date/Time: 12/24/2021/1:04:46 PM    Final    DG Chest Port 1 View  Result Date: 12/23/2021 CLINICAL DATA:  Shortness of breath EXAM: PORTABLE CHEST 1 VIEW COMPARISON:  08/09/2021 FINDINGS: Cardiac shadow is within normal limits. Aortic calcifications are again seen. Lungs are well aerated bilaterally. Mild vascular congestion is noted with interstitial edema. No bony abnormality is noted. IMPRESSION: Mild changes of CHF. Electronically Signed   By: Inez Catalina M.D.   On: 12/23/2021 02:01    EKG: Sinus tachycardia. RBBB.   ASSESSMENT AND PLAN:  Ruben Green is an 88 year old male with history of carotid stenosis s/p right CEA 2021, AAA s/p repair 2005, coronary atherosclerosis (reported cardiac cath several years ago), hypertension who presents with shortness of breath, cough, fever.  Cardiology is consulted for elevated troponin.  # Elevated troponin # Sepsis from likely pneumonia Patient has had cough and fever for a few days followed by respiratory distress on 12/22/2021 prompting his presentation.  In the setting he was discovered to be febrile and tachycardic.  Troponin increased to peak of 1969 without reported chest pain.  He was hypoxic on 12 L high flow nasal cannula on admission, weaned to 3 L today, with mild pulmonary infiltrates on chest x-ray out of  proportion to level of hypoxia.  I suspect his troponin elevation is demand in the setting of pneumonia, though it is impossible  to rule out acute coronary syndrome at this time.  EKG shows right bundle branch block without obvious acute ischemic changes. -Continue to treat underlying infection as you are doing - Continue 81 mg indefinitely -S/p heparin infusion x48 hours (ended 7/10) -Continue to hold lasix and home losartan 100 mg daily in setting of worsening renal function - improving as these have been held  -uptitrate metoprolol XL to '50mg'$  daily  -Monitor patient on telemetry while inpatient - Echocardiogram resulted with low normal LVEF, slightly decreased compared to prior study from February -Per Dr. Nehemiah Massed, will hold off on invasive ischemic evaluation at this time.AKI, active infection, and chronic severe anemia are all relative contraindications at this time.  -Will need close outpatient followup with Dr. Nehemiah Massed in 1-2 weeks after discharge.   This patient's plan of care was discussed and created with Dr. Nehemiah Massed and he is in agreement.    Signed: Tristan Schroeder, PA-C 12/26/2021, 10:54 AM

## 2021-12-26 NOTE — Progress Notes (Signed)
Physical Therapy Treatment Patient Details Name: Ruben Green MRN: [id number] DOB: January 22, 1934 Today's Date: 12/26/2021   History of Present Illness [name] is a 88 y.o. male with medical history significant for CAD, COPD, HTN, prostate cancer, AAA, history of postoperative hypoxia s/p hip replacement in February 2023, hospitalized in February 2023 with sepsis secondary to pneumonia and some concern for CHF with echo showing grade 1 DD, discharged home on 3 L oxygen who presents to the ED by EMS in acute respiratory distress that started during the afternoon hours with O2 sats 88% on room air.    PT Comments    Pt received in Semi-Fowler's position and agreeable to therapy.  Pt eager to get up and ambulate in order to improve his LE's strength.  Pt participated in gait training with good technique, and was able to ambulate around the nursing station x2.  Pt did have frequent standing rest breaks as he would speak to therapist, and utilized more frequent "furniture surfing" than in previous sessions.  This is to be expected due to increased mobility.  Pt transferred back to room with all needs met.  Current discharge plans to home with no PT remains appropriate at this time.  Pt will continue to benefit from skilled therapy in order to address deficits listed below.    Recommendations for follow up therapy are one component of a multi-disciplinary discharge planning process, led by the attending physician.  Recommendations may be updated based on patient status, additional functional criteria and insurance authorization.  Follow Up Recommendations  No PT follow up     Assistance Recommended at Discharge PRN  Patient can return home with the following A little help with walking and/or transfers;A little help with bathing/dressing/bathroom   Equipment Recommendations  None recommended by PT    Recommendations for Other Services       Precautions / Restrictions  Precautions Precautions: Fall Restrictions Weight Bearing Restrictions: No     Mobility  Bed Mobility Overal bed mobility: Needs Assistance Bed Mobility: Supine to Sit     Supine to sit: Supervision          Transfers Overall transfer level: Needs assistance Equipment used: None Transfers: Sit to/from Stand Sit to Stand: Supervision           General transfer comment: pt moves well without AD    Ambulation/Gait Ambulation/Gait assistance: Modified independent (Device/Increase time) Gait Distance (Feet): 320 Feet Assistive device: None Gait Pattern/deviations: Step-through pattern, Decreased stride length Gait velocity: decreased     General Gait Details: multiple standing rest breaks while pt was talking and using more support on handrails for balance today.   Stairs             Wheelchair Mobility    Modified Rankin (Stroke Patients Only)       Balance Overall balance assessment: Needs assistance Sitting-balance support: Feet supported, No upper extremity supported Sitting balance-Leahy Scale: Good     Standing balance support: Bilateral upper extremity supported Standing balance-Leahy Scale: Fair                              Cognition Arousal/Alertness: Awake/alert Behavior During Therapy: WFL for tasks assessed/performed Overall Cognitive Status: Within Functional Limits for tasks assessed  Exercises      General Comments General comments (skin integrity, edema, etc.): SpO2 91-94% on 3L [place] during ambulation again today.      Pertinent Vitals/Pain Pain Assessment Pain Assessment: No/denies pain    Home Living                          Prior Function            PT Goals (current goals can now be found in the care plan section) Acute Rehab PT Goals Patient Stated Goal: to get back home. PT Goal Formulation: With patient/family Time For Goal  Achievement: 01/08/22 Potential to Achieve Goals: Good Progress towards PT goals: Progressing toward goals    Frequency    Min 2X/week      PT Plan      Co-evaluation              AM-PAC PT "6 Clicks" Mobility   Outcome Measure  Help needed turning from your back to your side while in a flat bed without using bedrails?: None Help needed moving from lying on your back to sitting on the side of a flat bed without using bedrails?: None Help needed moving to and from a bed to a chair (including a wheelchair)?: None Help needed standing up from a chair using your arms (e.g., wheelchair or bedside chair)?: A Little Help needed to walk in hospital room?: A Little Help needed climbing 3-5 steps with a railing? : A Little 6 Click Score: 21    End of Session Equipment Utilized During Treatment: Gait belt Activity Tolerance: Patient tolerated treatment well Patient left: in bed;with call bell/phone within reach Nurse Communication: Mobility status PT Visit Diagnosis: Unsteadiness on feet (R26.81);Muscle weakness (generalized) (M62.81)     Time: 7209-4709 PT Time Calculation (min) (ACUTE ONLY): 25 min  Charges:  $Gait Training: 23-37 mins                     Gwenlyn Saran, PT, DPT 12/26/21, 2:12 PM    Christie Nottingham 12/26/2021, 2:11 PM

## 2021-12-26 NOTE — Plan of Care (Signed)
  Problem: Health Behavior/Discharge Planning: Goal: Ability to manage health-related needs will improve Outcome: Progressing   Problem: Clinical Measurements: Goal: Will remain free from infection Outcome: Progressing   Problem: Clinical Measurements: Goal: Respiratory complications will improve Outcome: Progressing   Problem: Clinical Measurements: Goal: Cardiovascular complication will be avoided Outcome: Progressing   Problem: Elimination: Goal: Will not experience complications related to bowel motility Outcome: Progressing   Problem: Elimination: Goal: Will not experience complications related to urinary retention Outcome: Progressing   Problem: Pain Managment: Goal: General experience of comfort will improve Outcome: Progressing   Problem: Safety: Goal: Ability to remain free from injury will improve Outcome: Progressing

## 2021-12-26 NOTE — Care Management Important Message (Signed)
Important Message  Patient Details  Name: Ruben Green MRN: [id number] Date of Birth: 14-Sep-1933   Medicare Important Message Given:  Yes     Dannette Barbara 12/26/2021, 12:11 PM

## 2021-12-26 NOTE — Progress Notes (Signed)
PULMONOLOGY         Date: 12/26/2021,   MRN# [id number] Ruben Green February 01, 1934     AdmissionWeight: 77.1 kg                 CurrentWeight: 75 kg  Referring provider: Dr Kurtis Bushman    CHIEF COMPLAINT:   Acute on chronic hypoxemic respiratory failure   HISTORY OF PRESENT ILLNESS   This is an 88 yo male with history of CAD, COPD, HTN, prostate cancer, AAA, history, hip replacement 07/2021, s/p CAP/AECHF in 07/2021 hospitalized at that time , TTE with HFpEF, recent onset of hypoxemia chronically requiring supplemental O2 at low level 1-3L/min [place]. He was noted to be hypoxemic on arrival to ED and required BIPAP.  Patient was febrile at the time with SIRS criteria met including tachycardia and tachypnea.  He had an arterial blood gas performed with significant hypoxemia on FiO2 45% however pH was within reference range.  He does have chronic anemia and renal impairment.  BNP was moderately elevated at 415 on arrival.  Empirically was treated with broad-spectrum antibiotics with cefepime and vancomycin as well as steroids utilizing IV Solu-Medrol.  He was diuresed in the ER with Lasix intravenously.  He was admitted with acute hypoxemic respiratory failure with multi focal pneumonia.  Cardiology consultation was placed for additional evaluation due to significant cardiac history.  Pulmonary consultation for severe recurrent hypoxemic failure and rehospitalization.  His CAT scan from February 2023 was reviewed by me with findings of significant centrilobular emphysema as well as a moderate volume hiatal hernia suspicious for recurrent aspiration.  At that time he also had consolidation of the right lower lobe and bilateral mild pleural effusion with associated atelectasis.  12/26/21- patient is improved. He was out of bed today doing well. He is getting close to baseline. I met with wife and son of patient.  He has severe anemia and cardiac dysfunction which will likely need outpatient  follow up with cardiology and hematology.  I will see patient in clinic for pulmonary follow up.    PAST MEDICAL HISTORY   Past Medical History:  Diagnosis Date   AAA (abdominal aortic aneurysm) ([place])    a.) s/p repair in 2005   Anemia    Anginal pain ([place])    Anxiety    Arthritis    B12 deficiency    Bilateral cataracts    a.) s/p BILATERAL extractions in 2018   BPH with obstruction/lower urinary tract symptoms    CAD (coronary artery disease)    Carotid artery stenosis    a.) s/p CEA on the RIGHT   COPD (chronic obstructive pulmonary disease) ([place])    Diastolic dysfunction 82/80/0349   a.)  TTE 07/29/2019: EF 50-55%; LA mildly enlarged; G1DD.   DOE (dyspnea on exertion)    Elevated PSA    Environmental and seasonal allergies    History of 2019 novel coronavirus disease (COVID-19)    History of kidney stones    HLD (hyperlipidemia)    HOH (hard of hearing)    HTN (hypertension)    Incomplete bladder emptying    Macular degeneration    Prostate cancer (HCC)    RBBB (right bundle branch block)    Valvular insufficiency    a.) TTE 07/29/2019: LVEF 50-55%; LA mild enlarged; triv AR/PR, mild MR, mod TR.     SURGICAL HISTORY   Past Surgical History:  Procedure Laterality Date   ABDOMINAL AORTIC ANEURYSM REPAIR  CATARACT EXTRACTION W/PHACO Right 09/25/2016   Procedure: CATARACT EXTRACTION PHACO AND INTRAOCULAR LENS PLACEMENT (IOC);  Surgeon: Birder Robson, MD;  Location: [facility] ORS;  Service: Ophthalmology;  Laterality: Right;  [place] 01:01 AP% 17.4 CDE 10.75 Fluid pack lot # [id number]   CATARACT EXTRACTION W/PHACO Left 10/16/2016   Procedure: CATARACT EXTRACTION PHACO AND INTRAOCULAR LENS PLACEMENT ([place]) Suture placed in Left eye;  Surgeon: Birder Robson, MD;  Location: [facility];  Service: Ophthalmology;  Laterality: Left;  [place] 2:06.8 AP% 22.2 CDE 28.17 Fluid pack lot # [id number]   COLONOSCOPY WITH PROPOFOL N/A 03/31/2019   Procedure: COLONOSCOPY WITH PROPOFOL;   Surgeon: Lollie Sails, MD;  Location[facility];  Service: Endoscopy;  Laterality: N/A;   COLONOSCOPY WITH PROPOFOL N/A 09/09/2020   Procedure: COLONOSCOPY WITH PROPOFOL;  Surgeon: Lesly Rubenstein, MD;  Location: [facility] ENDOSCOPY;  Service: Endoscopy;  Laterality: N/A;   CYSTOSCOPY W/ RETROGRADES Bilateral 02/23/2020   Procedure: CYSTOSCOPY WITH RETROGRADE PYELOGRAM;  Surgeon: Abbie Sons, MD;  Location: [facility] ORS;  Service: Urology;  Laterality: Bilateral;   CYSTOSCOPY WITH BIOPSY N/A 02/23/2020   Procedure: CYSTOSCOPY WITH BIOPSY;  Surgeon: Abbie Sons, MD;  Location: [facility] ORS;  Service: Urology;  Laterality: N/A;   EYE SURGERY     HERNIA REPAIR     x 2   INGUINAL HERNIA REPAIR Right 12/02/2015   Procedure: HERNIA REPAIR INGUINAL ADULT;  Surgeon: Leonie Green, MD;  Location: [facility] ORS;  Service: General;  Laterality: Right;   PROSTATE SURGERY  2012   Right Carotid Endarterectomy     TOTAL HIP ARTHROPLASTY Right 08/01/2021   Procedure: TOTAL HIP ARTHROPLASTY;  Surgeon: Corky Mull, MD;  Location: [facility] ORS;  Service: Orthopedics;  Laterality: Right;     FAMILY HISTORY   Family History  Problem Relation Age of Onset   Cancer Brother        2000   Prostate cancer Neg Hx    Bladder Cancer Neg Hx    Kidney cancer Neg Hx      SOCIAL HISTORY   Social History   Tobacco Use   Smoking status: Former    Types: Cigarettes    Quit date: 11/30/1998    Years since quitting: 23.0   Smokeless tobacco: Former   Tobacco comments:    quit 10 years ago  Vaping Use   Vaping Use: Never used  Substance Use Topics   Alcohol use: Not Currently    Comment: occasional   Drug use: No     MEDICATIONS    Home Medication:    Current Medication:  Current Facility-Administered Medications:    acetaminophen (TYLENOL) tablet 650 mg, 650 mg, Oral, Q4H PRN, Athena Masse, MD, 650 mg at 12/24/21 2143   amLODipine (NORVASC) tablet 10 mg, 10 mg, Oral, Daily, Amery, Sahar,  MD   aspirin EC tablet 81 mg, 81 mg, Oral, Daily, Judd Gaudier V, MD, 81 mg at 12/25/21 0846   atorvastatin (LIPITOR) tablet 40 mg, 40 mg, Oral, Daily, Judd Gaudier V, MD, 40 mg at 12/25/21 0846   azithromycin (ZITHROMAX) 500 mg in sodium chloride 0.9 % 250 mL IVPB, 500 mg, Intravenous, Q24H, Judd Gaudier V, MD, Last Rate: 250 mL/hr at 12/26/21 0354, 500 mg at 12/26/21 0354   cefTRIAXone (ROCEPHIN) 2 g in sodium chloride 0.9 % 100 mL IVPB, 2 g, Intravenous, Q24H, Athena Masse, MD, Stopped at 12/26/21 0236   guaiFENesin (MUCINEX) 12 hr tablet 600 mg, 600 mg, Oral, BID, Amery,  Gwynneth Albright, MD, 600 mg at 12/25/21 2113   hydrALAZINE (APRESOLINE) injection 10 mg, 10 mg, Intravenous, Q6H PRN, Nolberto Hanlon, MD   ipratropium-albuterol (DUONEB) 0.5-2.5 (3) MG/3ML nebulizer solution 3 mL, 3 mL, Nebulization, Q4H PRN, Nolberto Hanlon, MD, 3 mL at 12/24/21 1312   iron polysaccharides (NIFEREX) capsule 150 mg, 150 mg, Oral, BID, Nolberto Hanlon, MD, 150 mg at 12/25/21 2113   LORazepam (ATIVAN) tablet 0.5 mg, 0.5 mg, Oral, Daily PRN, Nolberto Hanlon, MD   methylPREDNISolone sodium succinate (SOLU-MEDROL) 40 mg/mL injection 40 mg, 40 mg, Intravenous, Q12H, Nolberto Hanlon, MD, 40 mg at 12/26/21 0001   metoprolol succinate (TOPROL-XL) 24 hr tablet 25 mg, 25 mg, Oral, Daily, Kurtis Bushman, Sahar, MD   nitroGLYCERIN (NITROSTAT) SL tablet 0.4 mg, 0.4 mg, Sublingual, Q5 Min x 3 PRN, Athena Masse, MD   ondansetron [facility]) injection 4 mg, 4 mg, Intravenous, Q6H PRN, Athena Masse, MD   tamsulosin [facility]) capsule 0.4 mg, 0.4 mg, Oral, Daily, Amery, Sahar, MD, 0.4 mg at 12/25/21 0846    ALLERGIES   Oxycodone     REVIEW OF SYSTEMS    Review of Systems:  Gen:  Denies  fever, sweats, chills weigh loss  HEENT: Denies blurred vision, double vision, ear pain, eye pain, hearing loss, nose bleeds, sore throat Cardiac:  No dizziness, chest pain or heaviness, chest tightness,edema Resp:   reports dyspnea chronically  Gi: Denies  swallowing difficulty, stomach pain, nausea or vomiting, diarrhea, constipation, bowel incontinence Gu:  Denies bladder incontinence, burning urine Ext:   Denies Joint pain, stiffness or swelling Skin: Denies  skin rash, easy bruising or bleeding or hives Endoc:  Denies polyuria, polydipsia , polyphagia or weight change Psych:   Denies depression, insomnia or hallucinations   Other:  All other systems negative   VS: BP (!) 146/75 (BP Location: Right Arm)   Pulse (!) 102   Temp 97.8 F (36.6 C) (Oral)   Resp 16   Ht _0  (1.575 m)   Wt 75 kg   SpO2 92%   BMI 30.24 kg/m      PHYSICAL EXAM    GENERAL:NAD, no fevers, chills, no weakness no fatigue HEAD: Normocephalic, atraumatic.  EYES: Pupils equal, round, reactive to light. Extraocular muscles intact. No scleral icterus.  MOUTH: Moist mucosal membrane. Dentition intact. No abscess noted.  EAR, NOSE, THROAT: Clear without exudates. No external lesions.  NECK: Supple. No thyromegaly. No nodules. No JVD.  PULMONARY: decreased breath sounds with mild rhonchi worse at bases bilaterally.  CARDIOVASCULAR: S1 and S2. Regular rate and rhythm. No murmurs, rubs, or gallops. No edema. Pedal pulses 2+ bilaterally.  GASTROINTESTINAL: Soft, nontender, nondistended. No masses. Positive bowel sounds. No hepatosplenomegaly.  MUSCULOSKELETAL: No swelling, clubbing, or edema. Range of motion full in all extremities.  NEUROLOGIC: Cranial nerves II through XII are intact. No gross focal neurological deficits. Sensation intact. Reflexes intact.  SKIN: No ulceration, lesions, rashes, or cyanosis. Skin warm and dry. Turgor intact.  PSYCHIATRIC: Mood, affect within normal limits. The patient is awake, alert and oriented x 3. Insight, judgment intact.       IMAGING            ASSESSMENT/PLAN   Acute on chronic hypoxemic respiratory failure  -Patient had a repeat transthoracic echo performed November 24, 2021 with poor windows but overall  findings are not very impressive with EF 50 to 68% and mild systolic and diastolic dysfunction.  Does not seem to be enough to fully explain current findings  with severe hypoxemia -BNP is moderately elevated at 415 which does point towards cardiac etiology and this is likely a component of his current issue although it may be secondary to whatever is causing his fevers. He had Multiple UTIs in the past, this time Urine culture is not bad.   -Due to moderate hiatal hernia and like to perform SLP with barium to evaluate for regurgitation and aspiration -agree with blood cultures and empiric abx for CAP at this time although we may need repeat CT to get better view of her lung parenchyma   Acute on chronic normocytic anemia   -His goal transfusion threshold should be hemoglobin 8 or more -This is also a component of his current hypoxemic failure and should be worked up on outpatient basis with heme-onc service   Bilateral pleural effusions with compressive atelectasis -Patient does have renal impairment and cardiac impairment which may be culprit of -May have component of hypertensive nephropathy, will recommend DC nonessential nephrotoxins GFR is currently reduced from 56-41. -Recruitment maneuvers with MetaNeb and albuterol.  Use due to its been active for like the last hour of course now acidity     Thank you for allowing me to participate in the care of this patient.   Patient/Family are satisfied with care plan and all questions have been answered.    Provider disclosure: Patient with at least one acute or chronic illness or injury that poses a threat to life or bodily function and is being managed actively during this encounter.  All of the below services have been performed independently by signing provider:  review of prior documentation from internal and or external health records.  Review of previous and current lab results.  Interview and comprehensive assessment during patient visit  today. Review of current and previous chest radiographs/CT scans. Discussion of management and test interpretation with health care team and patient/family.   This document was prepared using Dragon voice recognition software and may include unintentional dictation errors.     Ottie Glazier, M.D.  Division of Pulmonary & Critical Care Medicine

## 2021-12-26 NOTE — TOC Progression Note (Signed)
Transition of Care [facility]) - Progression Note    Patient Details  Name: Ruben Green MRN: [id number] Date of Birth: January 25, 1934  [facility]) CM/SW [place], RN Phone Number: 12/26/2021, 4:18 PM  Clinical Narrative:   RNCM contacted Meg from [facility], she states they are expecting to accept patient back to their services on discharge.    Expected Discharge Plan: Home/Self Care Barriers to Discharge: Continued Medical Work up  Expected Discharge Plan and Services Expected Discharge Plan: Home/Self Care       Living arrangements for the past 2 months: Single Family Home                                       Social Determinants of Health (SDOH) Interventions    Readmission Risk Interventions     No data to display

## 2021-12-26 NOTE — Progress Notes (Signed)
Pt and family offered portable interpreter but refused at this time. Will continue to monitor.

## 2021-12-26 NOTE — Progress Notes (Addendum)
PROGRESS NOTE    Ruben Green  TDD:[id number] DOB: [birth year] DOA: 12/23/2021 PCP: Tracie Harrier, MD    Brief Narrative:  Ruben Green is a 88 y.o. male with medical history significant for CAD, COPD, HTN, prostate cancer, AAA, history of postoperative hypoxia s/p hip replacement in February 2023, hospitalized in February 2023 with sepsis secondary to pneumonia and some concern for CHF with echo showing grade 1 DD, discharged home on 3 L oxygen who presents to the ED by EMS in acute respiratory distress that started during the afternoon hours with O2 sats 88% on room air.  He had no chest pain.  Patient had a several day history of cough and congestion and fever.  O2 sat on room air with EMS was in the 70s and he was placed on NRB Patient came in with fever tachycardia and elevated respiratory rate.  He was started on NRB and transitioned to bipap and now down to 3L 02.  Troponin 82-1290 and BNP 415 COVID and flu negative Treated for pna and chf. Iv lasix stopped due to aki. PCCM and cardiology following. Restarted on iv steroids.     7/9 02 weaned to 8-9 L [place] with 02 sat 92%. Does become sob with conversation. Lasix held. Creatinine up  7/11 down to [facility].  CT chest:New bilateral lower lobe consolidations.  SPL consulted    Consultants:  Cardiology, pccm  Procedures:  CT chest 1. New bilateral lower lobe consolidations, ground-glass opacity of the posterior right upper lobe and lower lobe bronchial wall thickening, findings can be seen in the setting of infection or aspiration. Recommend follow-up chest CT in 3 months to ensure resolution. 2. No evidence of fibrotic interstitial lung disease. 3. Moderate hiatal hernia. 4. Trace left-greater-than-right pleural effusions. 5. Aortic Atherosclerosis (ICD10-I70.0) and Emphysema (ICD10-J43.9).  Antimicrobials:  Ceftriaxone Azithromycin Vancomycin x1   Subjective: Feeling better, less sob at rest. No cp.  Coughing up phlegm.   Objective: Vitals:   12/26/21 0357 12/26/21 0504 12/26/21 0757 12/26/21 1141  BP:  (!) 160/87 (!) 146/75 135/70  Pulse:  78 (!) 102 86  Resp:  '18 16 16  '$ Temp:  98.7 F (37.1 C) 97.8 F (36.6 C) 97.6 F (36.4 C)  TempSrc:   Oral Oral  SpO2:  94% 92% 95%  Weight: 75 kg     Height:        Intake/Output Summary (Last 24 hours) at 12/26/2021 1409 Last data filed at 12/26/2021 1201 Gross per 24 hour  Intake 480 ml  Output 3200 ml  Net -2720 ml   Filed Weights   12/25/21 0200 12/25/21 1944 12/26/21 0357  Weight: 73.9 kg 73.9 kg 75 kg    Examination: Calm, NAD Coarse bs, no wheezing Reg s1/s2 no gallop Soft benign +bs No edema Aaoxox3  Mood and affect appropriate in current setting     Data Reviewed: I have personally reviewed following labs and imaging studies  CBC: Recent Labs  Lab 12/23/21 0140 12/24/21 0526 12/25/21 0411 12/26/21 0408  WBC 13.2* 12.2* 7.8 9.0  NEUTROABS 11.9*  --   --   --   HGB 9.6* 8.0* 8.0* 8.5*  HCT 33.3* 26.8* 26.0* 28.2*  MCV 90.7 88.4 86.7 86.5  PLT 220 185 195 623   Basic Metabolic Panel: Recent Labs  Lab 12/23/21 0140 12/24/21 0526 12/25/21 0411 12/26/21 0833  NA 142 136 136 139  K 5.3* 4.7 4.9 4.6  CL 113* 105 105 107  CO2 23  22 21* 22  GLUCOSE 164* 134* 168* 181*  BUN 23 37* 48* 53*  CREATININE 1.25* 1.63* 1.50* 1.34*  CALCIUM 8.5* 8.2* 8.1* 8.8*  MG 1.9  --   --   --    GFR: Estimated Creatinine Clearance: 35.1 mL/min (A) (by C-G formula based on SCr of 1.34 mg/dL (H)). Liver Function Tests: Recent Labs  Lab 12/23/21 0140  AST 28  ALT 25  ALKPHOS 73  BILITOT 0.7  PROT 6.6  ALBUMIN 3.6   No results for input(s): "LIPASE", "AMYLASE" in the last 168 hours. No results for input(s): "AMMONIA" in the last 168 hours. Coagulation Profile: Recent Labs  Lab 12/23/21 0140  INR 1.1   Cardiac Enzymes: No results for input(s): "CKTOTAL", "CKMB", "CKMBINDEX", "TROPONINI" in the last 168  hours. BNP (last 3 results) No results for input(s): "PROBNP" in the last 8760 hours. HbA1C: No results for input(s): "HGBA1C" in the last 72 hours. CBG: No results for input(s): "GLUCAP" in the last 168 hours. Lipid Profile: No results for input(s): "CHOL", "HDL", "LDLCALC", "TRIG", "CHOLHDL", "LDLDIRECT" in the last 72 hours. Thyroid Function Tests: No results for input(s): "TSH", "T4TOTAL", "FREET4", "T3FREE", "THYROIDAB" in the last 72 hours. Anemia Panel: No results for input(s): "VITAMINB12", "FOLATE", "FERRITIN", "TIBC", "IRON", "RETICCTPCT" in the last 72 hours. Sepsis Labs: Recent Labs  Lab 12/23/21 0140 12/23/21 1418  PROCALCITON  --  4.86  LATICACIDVEN 1.4  --     Recent Results (from the past 240 hour(s))  Resp Panel by RT-PCR (Flu A&B, Covid) Anterior Nasal Swab     Status: None   Collection Time: 12/23/21  1:40 AM   Specimen: Anterior Nasal Swab  Result Value Ref Range Status   SARS Coronavirus 2 by RT PCR NEGATIVE NEGATIVE Final    Comment: (NOTE) SARS-CoV-2 target nucleic acids are NOT DETECTED.  The SARS-CoV-2 RNA is generally detectable in upper respiratory specimens during the acute phase of infection. The lowest concentration of SARS-CoV-2 viral copies this assay can detect is 138 copies/mL. A negative result does not preclude SARS-Cov-2 infection and should not be used as the sole basis for treatment or other patient management decisions. A negative result may occur with  improper specimen collection/handling, submission of specimen other than nasopharyngeal swab, presence of viral mutation(s) within the areas targeted by this assay, and inadequate number of viral copies(<138 copies/mL). A negative result must be combined with clinical observations, patient history, and epidemiological information. The expected result is Negative.  Fact Sheet for Patients:  EntrepreneurPulse.com.au  Fact Sheet for Healthcare Providers:   IncredibleEmployment.be  This test is no t yet approved or cleared by the [place] FDA and  has been authorized for detection and/or diagnosis of SARS-CoV-2 by FDA under an Emergency Use Authorization (EUA). This EUA will remain  in effect (meaning this test can be used) for the duration of the COVID-19 declaration under Section 564(b)(1) of the Act, 21 U.S.C.section 360bbb-3(b)(1), unless the authorization is terminated  or revoked sooner.       Influenza A by PCR NEGATIVE NEGATIVE Final   Influenza B by PCR NEGATIVE NEGATIVE Final    Comment: (NOTE) The Xpert Xpress SARS-CoV-2/FLU/RSV plus assay is intended as an aid in the diagnosis of influenza from Nasopharyngeal swab specimens and should not be used as a sole basis for treatment. Nasal washings and aspirates are unacceptable for Xpert Xpress SARS-CoV-2/FLU/RSV testing.  Fact Sheet for Patients: EntrepreneurPulse.com.au  Fact Sheet for Healthcare Providers: IncredibleEmployment.be  This test is not  yet approved or cleared by the [place] and has been authorized for detection and/or diagnosis of SARS-CoV-2 by FDA under an Emergency Use Authorization (EUA). This EUA will remain in effect (meaning this test can be used) for the duration of the COVID-19 declaration under Section 564(b)(1) of the Act, 21 U.S.C. section 360bbb-3(b)(1), unless the authorization is terminated or revoked.  Performed at [facility], [address] [place], [place] [address]   Blood Culture (routine x 2)     Status: None (Preliminary result)   Collection Time: 12/23/21  1:40 AM   Specimen: BLOOD  Result Value Ref Range Status   Specimen Description BLOOD RIGHT ANTECUBITAL  Final   Special Requests   Final    BOTTLES DRAWN AEROBIC AND ANAEROBIC Blood Culture results may not be optimal due to an excessive volume of blood received in culture bottles   Culture   Final     NO GROWTH 3 DAYS Performed at [facility], [address], [place], [place] [address]    Report Status PENDING  Incomplete  Blood Culture (routine x 2)     Status: None (Preliminary result)   Collection Time: 12/23/21  1:40 AM   Specimen: BLOOD  Result Value Ref Range Status   Specimen Description   Final    BLOOD LEFT ANTECUBITAL Performed at [facility], [address] [place], [place] [address]    Special Requests   Final    BOTTLES DRAWN AEROBIC AND ANAEROBIC Blood Culture results may not be optimal due to an excessive volume of blood received in culture bottles Performed at [facility], [address], [place], [place] [address]    Culture   Final    NO GROWTH 3 DAYS Performed at [facility], [place] [address], [place], [place] [address]    Report Status PENDING  Incomplete  Urine Culture     Status: Abnormal   Collection Time: 12/23/21  4:44 AM   Specimen: In/Out Cath Urine  Result Value Ref Range Status   Specimen Description   Final    IN/OUT CATH URINE Performed at [facility], [address], [place], [place] [address]    Special Requests   Final    NONE Performed at [facility], [address] [place], [place] [address]    Culture (A)  Final    100 COLONIES/mL STAPHYLOCOCCUS EPIDERMIDIS CALL MICROBIOLOGY LAB IF SENSITIVITIES ARE REQUIRED. Performed at [facility], [place] [address], [place], [place] [address]    Report Status 12/24/2021 FINAL  Final         Radiology Studies: DG ESOPHAGUS W DOUBLE CM (HD)  Result Date: 12/26/2021 INDICATION: Patient with multiple admissions for pneumonia concern for aspiration and asked to evaluate hiatal hernia. EXAM: ESOPHAGUS/BARIUM SWALLOW/TABLET STUDY TECHNIQUE: Single contrast examination was performed using thin barium liquid. FLUOROSCOPY TIME:  Radiation Exposure Index (as provided by the fluoroscopic device): 12.10 mGy COMPARISON:  CT angio chest  August 08, 2021. PROCEDURE: Normal pharyngeal anatomy and motility. No laryngeal penetration or tracheal aspiration. Contrast flowed freely through the esophagus without evidence of a stricture or mass. Normal esophageal mucosa without evidence of irregularity or ulceration. Significant esophageal dysmotility is seen with associated tertiary contractions and esophageal spasm. No evidence of reflux. Moderate sized hiatal hernia was demonstrated. A barium tablet was not given since the patient was unable to stand and the procedure was performed lying down. COMPLICATIONS: NONE. IMPRESSION: 1.  No laryngeal  penetration or tracheal aspiration. 2.  Normal pharyngeal anatomy and motility. 3. No evidence of esophageal stricture, a barium tablet was not given since the patient was unable to stand and the procedure was performed lying down. 4. Significant esophageal dysmotility is seen with associated tertiary contractions/esophageal spasm. 5. Moderate hiatal hernia. 6. No evidence of gastroesophageal reflux. This exam was performed by Tsosie Billing PA-C, and was supervised and interpreted by Dr. Candise Che. Electronically Signed   By: Marijo Sanes M.D.   On: 12/26/2021 11:20   CT Chest High Resolution  Result Date: 12/25/2021 CLINICAL DATA:  Hypoxia EXAM: CT CHEST WITHOUT CONTRAST TECHNIQUE: Multidetector CT imaging of the chest was performed following the standard protocol without intravenous contrast. High resolution imaging of the lungs, as well as inspiratory and expiratory imaging, was performed. RADIATION DOSE REDUCTION: This exam was performed according to the departmental dose-optimization program which includes automated exposure control, adjustment of the mA and/or kV according to patient size and/or use of iterative reconstruction technique. COMPARISON:  Chest CT dated August 08, 2021 FINDINGS: Cardiovascular: Normal heart size. Trace pericardial fluid. Calcifications of the mitral valve and chordae  tendineae. Severe left main, lad and circumflex coronary artery calcifications. Severe atherosclerotic disease of the thoracic aorta. Mediastinum/Nodes: Moderate hiatal hernia. Thyroid is unremarkable. No pathologically enlarged lymph nodes seen in the chest. Lungs/Pleura: Central airways are patent. No significant air trapping. Severe centrilobular emphysema. New bronchial wall thickening of the bilateral lower lobes, ground-glass opacity of the posterior right upper lobe and bilateral lower lobe consolidations. Trace left-greater-than-right pleural effusions. Upper Abdomen: Simple appearing cysts of the left kidney, no further follow-up imaging recommended. Diverticulosis. No acute abnormality. Musculoskeletal: No chest wall mass or suspicious bone lesions identified. IMPRESSION: 1. New bilateral lower lobe consolidations, ground-glass opacity of the posterior right upper lobe and lower lobe bronchial wall thickening, findings can be seen in the setting of infection or aspiration. Recommend follow-up chest CT in 3 months to ensure resolution. 2. No evidence of fibrotic interstitial lung disease. 3. Moderate hiatal hernia. 4. Trace left-greater-than-right pleural effusions. 5. Aortic Atherosclerosis (ICD10-I70.0) and Emphysema (ICD10-J43.9). Electronically Signed   By: Yetta Glassman M.D.   On: 12/25/2021 16:19        Scheduled Meds:  amLODipine  10 mg Oral Daily   amoxicillin-clavulanate  1 tablet Oral Q12H   aspirin EC  81 mg Oral Daily   atorvastatin  40 mg Oral Daily   enoxaparin (LOVENOX) injection  40 mg Subcutaneous Q24H   guaiFENesin  600 mg Oral BID   iron polysaccharides  150 mg Oral BID   methylPREDNISolone (SOLU-MEDROL) injection  40 mg Intravenous Daily   metoprolol succinate  25 mg Oral BID   [START ON 12/27/2021] metoprolol succinate  50 mg Oral Daily   polyethylene glycol  17 g Oral Daily   senna-docusate  1 tablet Oral BID   tamsulosin  0.4 mg Oral Daily   Continuous  Infusions:    Assessment & Plan:   Principal Problem:   Acute respiratory failure with hypoxia (HCC) Active Problems:   Sepsis ([place])   Acute on chronic diastolic CHF (congestive heart failure) (HCC)   Respiratory tract infection   COPD with acute exacerbation (HCC)   Essential (primary) hypertension   Abdominal aortic aneurysm (AAA) without rupture   Coronary artery disease involving native coronary artery of native heart without angina pectoris   Anemia   Elevated troponin   Acute respiratory failure with hypoxia ([place]) Multifactorial and related to COPD and  possible respiratory tract infection as well as acute CHF Suspect patient has underlying chronic respiratory failure given discharge back in February on O2 at 3 L which he was not wearing 7/11 PCCM following CT of the chest New bilateral lower lobe consolidations See full report above Clinically slowly improving Less tachypneic with conversation today Continue IV steroids and will need to be discharged on p.o. taper-likely tomorrow Continue Mucinex Hold Lasix SPL to evaluate aspiration and hiatal hernia Continue IV antibiotics       Elevated Troponin CAD Due to demand ischemia, hard to r/o ACS at this time. 7/11 EF 50 to 55%.  No regional wall motion abnormality.  Grade 1 diastolic dysfunction. Cardiology following Status post heparin infusion x48 hours (ended 7/10) Holding Lasix Continue aspirin 81 mg indefinitely Continue beta-blockers, upgraded to 50 mg daily We will hold off on invasive ischemic evaluation at this time follow-up Dr. Nehemiah Massed in 1 to 2 weeks after discharge         Respiratory tract infection Cta with new b/l consolidation. Likely pna Procalcitonin elevated Continue IV antibiotics-complete 7-day course total Urine culture with staph epi likely contaminant, no treatment needed for this   Hx/o anemia Continue iron F/u with a hematology as outpt    Acute on chronic diastolic CHF  (congestive heart failure) ([place]) Echo in February 2023 with grade 1 diastolic dysfunction Elevated BNP and chest x-ray showing mild CHF changes Now euvolemic Hold lasix as creatinine up I/o Daily wt  Sepsis (HCC) Likely due to respiratory infection/PNA Treat with ivabx  AKI on CKD stage IIIa Creatinine up with diuretics Improving with holding lasix Avoid nephrotoxic meds   COPD with acute exacerbation (HCC)  Start steroids as has some wheezing on exam Tx as above   Essential (primary) hypertension Bp stable Hold losartan due to elevated creatinine   Abdominal aortic aneurysm (AAA) without rupture Acute complication not suspected at this time as patient denies pain   DVT prophylaxis: Heparin Code Status: Full Family Communication: wife at bedside Disposition Plan: home  Status is: Inpatient Remains inpatient appropriate because: IV treatment        LOS: 3 days   Time spent: 35 minutes with more than 50% on [place], MD [facility] Pager [phone]  If 7PM-7AM, please contact night-coverage 12/26/2021, 2:09 PM  5

## 2021-12-27 DIAGNOSIS — J441 Chronic obstructive pulmonary disease with (acute) exacerbation: Secondary | ICD-10-CM | POA: Diagnosis not present

## 2021-12-27 DIAGNOSIS — J189 Pneumonia, unspecified organism: Secondary | ICD-10-CM | POA: Diagnosis not present

## 2021-12-27 LAB — BASIC METABOLIC PANEL
Anion gap: 7 (ref 5–15)
BUN: 60 mg/dL — ABNORMAL HIGH (ref 8–23)
CO2: 22 mmol/L (ref 22–32)
Calcium: 8.6 mg/dL — ABNORMAL LOW (ref 8.9–10.3)
Chloride: 107 mmol/L (ref 98–111)
Creatinine, Ser: 1.51 mg/dL — ABNORMAL HIGH (ref 0.61–1.24)
GFR, Estimated: 45 mL/min — ABNORMAL LOW (ref >60)
Glucose, Bld: 168 mg/dL — ABNORMAL HIGH (ref 70–99)
Potassium: 4.8 mmol/L (ref 3.5–5.1)
Sodium: 136 mmol/L (ref 135–145)

## 2021-12-27 MED ORDER — PREDNISONE 50 MG PO TABS
50.0000 mg | ORAL_TABLET | Freq: Every day | ORAL | Status: DC
  Administered 2021-12-28: 50 mg via ORAL
  Filled 2021-12-27: qty 1

## 2021-12-27 NOTE — Progress Notes (Signed)
PROGRESS NOTE    Ruben Green  OEU:[id number] DOB: 04/26/1934 DOA: 12/23/2021 PCP: Tracie Harrier, MD    Assessment & Plan:   Principal Problem:   Acute respiratory failure with hypoxia ([facility]) Active Problems:   Sepsis ([place])   Acute on chronic diastolic CHF (congestive heart failure) (H[facility])   Respiratory tract infection   COPD with acute exacerbation ([facility])   Essential (primary) hypertension   Abdominal aortic aneurysm (AAA) without rupture   Coronary artery disease involving native coronary artery of native heart without angina pectoris   Anemia   Elevated troponin  Assessment and Plan: Acute hypoxic respiratory failure: likely secondary to COPD, CHF exacerbation & pneumonia.  Continue to hold IV lasix. Continue on augmentin, steroids, bronchodilators & encourage incentive spirometry   Elevated troponins: likely secondary to demand ischemia. ACS r/o. No invasive ischemic work-up as per cardio. F/u w/ cardio, Dr. Nehemiah Massed, in 1-2 weeks   Hx of CAD: continue on aspirin, metoprolol.   Pneumonia: continue on augmentin, bronchodilators & encourage incentive spirometry   Acute on chronic diastolic CHF: appear euvolemic now. Holding lasix. Monitor I/Os  Sepsis: see Dr. Lupita Dawn note on how the pt met sepsis criteria. Sepsis resolved  AKI on CKDIIIa: Cr is labile. Avoid nephrotoxic meds  COPD exacerbation: continue on steroids, bronchodilators & encourage incentive spirometry   HTN: continue on metoprolol. holding losartan   AAA: will need outpatient f/u  Obesity: BMI 30.9. Complicates overall care & prognosis   DVT prophylaxis: lovenox  Code Status: full  Family Communication: discussed pt's care w/ pt's family at bedside and answered their questions  Disposition Plan: likely d/c back home   Level of care: Progressive  Consultants:  Cardio Pulmon   Procedures:   Antimicrobials: augmentin    Subjective: Pt c/o fatigue   Objective: Vitals:   12/27/21  0353 12/27/21 0416 12/27/21 0453 12/27/21 0825  BP: (!) 172/72 (!) 172/89 (!) 144/78 (!) 160/75  Pulse: 72   84  Resp: '20 18  18  ' Temp: (!) 97.5 F (36.4 C)   97.8 F (36.6 C)  TempSrc: Oral   Oral  SpO2: 99%   91%  Weight: 76.7 kg     Height:        Intake/Output Summary (Last 24 hours) at 12/27/2021 0833 Last data filed at 12/27/2021 1540 Gross per 24 hour  Intake 838 ml  Output 3000 ml  Net -2162 ml   Filed Weights   12/25/21 1944 12/26/21 0357 12/27/21 0353  Weight: 73.9 kg 75 kg 76.7 kg    Examination:  General exam: Appears calm and comfortable  Respiratory system: diminished breath sounds  Cardiovascular system: S1 & S2 +. No rubs, gallops or clicks.  Gastrointestinal system: Abdomen is nondistended, soft and nontender. Normal bowel sounds heard. Central nervous system: Alert and oriented. Moves all extremities  Psychiatry: Judgement and insight appear normal. Flat mood and affect      Data Reviewed: I have personally reviewed following labs and imaging studies  CBC: Recent Labs  Lab 12/23/21 0140 12/24/21 0526 12/25/21 0411 12/26/21 0408  WBC 13.2* 12.2* 7.8 9.0  NEUTROABS 11.9*  --   --   --   HGB 9.6* 8.0* 8.0* 8.5*  HCT 33.3* 26.8* 26.0* 28.2*  MCV 90.7 88.4 86.7 86.5  PLT 220 185 195 086   Basic Metabolic Panel: Recent Labs  Lab 12/23/21 0140 12/24/21 0526 12/25/21 0411 12/26/21 0833 12/27/21 0434  NA 142 136 136 139 136  K 5.3* 4.7 4.9  4.6 4.8  CL 113* 105 105 107 107  CO2 23 22 21* 22 22  GLUCOSE 164* 134* 168* 181* 168*  BUN 23 37* 48* 53* 60*  CREATININE 1.25* 1.63* 1.50* 1.34* 1.51*  CALCIUM 8.5* 8.2* 8.1* 8.8* 8.6*  MG 1.9  --   --   --   --    GFR: Estimated Creatinine Clearance: 31.5 mL/min (A) (by C-G formula based on SCr of 1.51 mg/dL (H)). Liver Function Tests: Recent Labs  Lab 12/23/21 0140  AST 28  ALT 25  ALKPHOS 73  BILITOT 0.7  PROT 6.6  ALBUMIN 3.6   No results for input(s): "LIPASE", "AMYLASE" in the last  168 hours. No results for input(s): "AMMONIA" in the last 168 hours. Coagulation Profile: Recent Labs  Lab 12/23/21 0140  INR 1.1   Cardiac Enzymes: No results for input(s): "CKTOTAL", "CKMB", "CKMBINDEX", "TROPONINI" in the last 168 hours. BNP (last 3 results) No results for input(s): "PROBNP" in the last 8760 hours. HbA1C: No results for input(s): "HGBA1C" in the last 72 hours. CBG: No results for input(s): "GLUCAP" in the last 168 hours. Lipid Profile: No results for input(s): "CHOL", "HDL", "LDLCALC", "TRIG", "CHOLHDL", "LDLDIRECT" in the last 72 hours. Thyroid Function Tests: No results for input(s): "TSH", "T4TOTAL", "FREET4", "T3FREE", "THYROIDAB" in the last 72 hours. Anemia Panel: No results for input(s): "VITAMINB12", "FOLATE", "FERRITIN", "TIBC", "IRON", "RETICCTPCT" in the last 72 hours. Sepsis Labs: Recent Labs  Lab 12/23/21 0140 12/23/21 1418  PROCALCITON  --  4.86  LATICACIDVEN 1.4  --     Recent Results (from the past 240 hour(s))  Resp Panel by RT-PCR (Flu A&B, Covid) Anterior Nasal Swab     Status: None   Collection Time: 12/23/21  1:40 AM   Specimen: Anterior Nasal Swab  Result Value Ref Range Status   SARS Coronavirus 2 by RT PCR NEGATIVE NEGATIVE Final    Comment: (NOTE) SARS-CoV-2 target nucleic acids are NOT DETECTED.  The SARS-CoV-2 RNA is generally detectable in upper respiratory specimens during the acute phase of infection. The lowest concentration of SARS-CoV-2 viral copies this assay can detect is 138 copies/mL. A negative result does not preclude SARS-Cov-2 infection and should not be used as the sole basis for treatment or other patient management decisions. A negative result may occur with  improper specimen collection/handling, submission of specimen other than nasopharyngeal swab, presence of viral mutation(s) within the areas targeted by this assay, and inadequate number of viral copies(<138 copies/mL). A negative result must be  combined with clinical observations, patient history, and epidemiological information. The expected result is Negative.  Fact Sheet for Patients:  EntrepreneurPulse.com.au  Fact Sheet for Healthcare Providers:  IncredibleEmployment.be  This test is no t yet approved or cleared by the [place] FDA and  has been authorized for detection and/or diagnosis of SARS-CoV-2 by FDA under an Emergency Use Authorization (EUA). This EUA will remain  in effect (meaning this test can be used) for the duration of the COVID-19 declaration under Section 564(b)(1) of the Act, 21 U.S.C.section 360bbb-3(b)(1), unless the authorization is terminated  or revoked sooner.       Influenza A by PCR NEGATIVE NEGATIVE Final   Influenza B by PCR NEGATIVE NEGATIVE Final    Comment: (NOTE) The Xpert Xpress SARS-CoV-2/FLU/RSV plus assay is intended as an aid in the diagnosis of influenza from Nasopharyngeal swab specimens and should not be used as a sole basis for treatment. Nasal washings and aspirates are unacceptable for Xpert Xpress  SARS-CoV-2/FLU/RSV testing.  Fact Sheet for Patients: EntrepreneurPulse.com.au  Fact Sheet for Healthcare Providers: IncredibleEmployment.be  This test is not yet approved or cleared by the [place] FDA and has been authorized for detection and/or diagnosis of SARS-CoV-2 by FDA under an Emergency Use Authorization (EUA). This EUA will remain in effect (meaning this test can be used) for the duration of the COVID-19 declaration under Section 564(b)(1) of the Act, 21 U.S.C. section 360bbb-3(b)(1), unless the authorization is terminated or revoked.  Performed at [facility], [address] [place], [place] [address]   Blood Culture (routine x 2)     Status: None (Preliminary result)   Collection Time: 12/23/21  1:40 AM   Specimen: BLOOD  Result Value Ref Range Status   Specimen  Description BLOOD RIGHT ANTECUBITAL  Final   Special Requests   Final    BOTTLES DRAWN AEROBIC AND ANAEROBIC Blood Culture results may not be optimal due to an excessive volume of blood received in culture bottles   Culture   Final    NO GROWTH 4 DAYS Performed at [facility], [address] [place], [place] [address]    Report Status PENDING  Incomplete  Blood Culture (routine x 2)     Status: None (Preliminary result)   Collection Time: 12/23/21  1:40 AM   Specimen: BLOOD  Result Value Ref Range Status   Specimen Description BLOOD LEFT ANTECUBITAL  Final   Special Requests   Final    BOTTLES DRAWN AEROBIC AND ANAEROBIC Blood Culture results may not be optimal due to an excessive volume of blood received in culture bottles   Culture   Final    NO GROWTH 4 DAYS Performed at [facility], [address], [place], [place] [address]    Report Status PENDING  Incomplete  Urine Culture     Status: Abnormal   Collection Time: 12/23/21  4:44 AM   Specimen: In/Out Cath Urine  Result Value Ref Range Status   Specimen Description   Final    IN/OUT CATH URINE Performed at [facility], [address], [place], [place] [address]    Special Requests   Final    NONE Performed at [facility], [address], [place], [place] [address]    Culture (A)  Final    100 COLONIES/mL STAPHYLOCOCCUS EPIDERMIDIS CALL MICROBIOLOGY LAB IF SENSITIVITIES ARE REQUIRED. Performed at [facility], [place] [address], [place], [place] [address]    Report Status 12/24/2021 FINAL  Final         Radiology Studies: DG ESOPHAGUS W DOUBLE CM (HD)  Result Date: 12/26/2021 INDICATION: Patient with multiple admissions for pneumonia concern for aspiration and asked to evaluate hiatal hernia. EXAM: ESOPHAGUS/BARIUM SWALLOW/TABLET STUDY TECHNIQUE: Single contrast examination was performed using thin barium liquid. FLUOROSCOPY TIME:  Radiation Exposure Index (as provided  by the fluoroscopic device): 12.10 mGy COMPARISON:  CT angio chest August 08, 2021. PROCEDURE: Normal pharyngeal anatomy and motility. No laryngeal penetration or tracheal aspiration. Contrast flowed freely through the esophagus without evidence of a stricture or mass. Normal esophageal mucosa without evidence of irregularity or ulceration. Significant esophageal dysmotility is seen with associated tertiary contractions and esophageal spasm. No evidence of reflux. Moderate sized hiatal hernia was demonstrated. A barium tablet was not given since the patient was unable to stand and the procedure was performed lying down. COMPLICATIONS: NONE. IMPRESSION: 1.  No laryngeal penetration or tracheal aspiration. 2.  Normal pharyngeal anatomy and motility.  3. No evidence of esophageal stricture, a barium tablet was not given since the patient was unable to stand and the procedure was performed lying down. 4. Significant esophageal dysmotility is seen with associated tertiary contractions/esophageal spasm. 5. Moderate hiatal hernia. 6. No evidence of gastroesophageal reflux. This exam was performed by Tsosie Billing PA-C, and was supervised and interpreted by Dr. Candise Che. Electronically Signed   By: Marijo Sanes M.D.   On: 12/26/2021 11:20   CT Chest High Resolution  Result Date: 12/25/2021 CLINICAL DATA:  Hypoxia EXAM: CT CHEST WITHOUT CONTRAST TECHNIQUE: Multidetector CT imaging of the chest was performed following the standard protocol without intravenous contrast. High resolution imaging of the lungs, as well as inspiratory and expiratory imaging, was performed. RADIATION DOSE REDUCTION: This exam was performed according to the departmental dose-optimization program which includes automated exposure control, adjustment of the mA and/or kV according to patient size and/or use of iterative reconstruction technique. COMPARISON:  Chest CT dated August 08, 2021 FINDINGS: Cardiovascular: Normal heart size. Trace  pericardial fluid. Calcifications of the mitral valve and chordae tendineae. Severe left main, lad and circumflex coronary artery calcifications. Severe atherosclerotic disease of the thoracic aorta. Mediastinum/Nodes: Moderate hiatal hernia. Thyroid is unremarkable. No pathologically enlarged lymph nodes seen in the chest. Lungs/Pleura: Central airways are patent. No significant air trapping. Severe centrilobular emphysema. New bronchial wall thickening of the bilateral lower lobes, ground-glass opacity of the posterior right upper lobe and bilateral lower lobe consolidations. Trace left-greater-than-right pleural effusions. Upper Abdomen: Simple appearing cysts of the left kidney, no further follow-up imaging recommended. Diverticulosis. No acute abnormality. Musculoskeletal: No chest wall mass or suspicious bone lesions identified. IMPRESSION: 1. New bilateral lower lobe consolidations, ground-glass opacity of the posterior right upper lobe and lower lobe bronchial wall thickening, findings can be seen in the setting of infection or aspiration. Recommend follow-up chest CT in 3 months to ensure resolution. 2. No evidence of fibrotic interstitial lung disease. 3. Moderate hiatal hernia. 4. Trace left-greater-than-right pleural effusions. 5. Aortic Atherosclerosis (ICD10-I70.0) and Emphysema (ICD10-J43.9). Electronically Signed   By: Yetta Glassman M.D.   On: 12/25/2021 16:19        Scheduled Meds:  amLODipine  10 mg Oral Daily   amoxicillin-clavulanate  1 tablet Oral Q12H   aspirin EC  81 mg Oral Daily   atorvastatin  40 mg Oral Daily   enoxaparin (LOVENOX) injection  40 mg Subcutaneous Q24H   guaiFENesin  600 mg Oral BID   iron polysaccharides  150 mg Oral BID   methylPREDNISolone (SOLU-MEDROL) injection  40 mg Intravenous Daily   metoprolol succinate  50 mg Oral Daily   polyethylene glycol  17 g Oral Daily   senna-docusate  1 tablet Oral BID   tamsulosin  0.4 mg Oral Daily   Continuous  Infusions:   LOS: 4 days    Time spent: 30 mins     Wyvonnia Dusky, MD [facility] Pager [phone]  If 7PM-7AM, please contact night-coverage [email or web address]  12/27/2021, 8:33 AM

## 2021-12-27 NOTE — Progress Notes (Signed)
PT Cancellation Note  Patient Details Name: Ruben Green MRN: [id number] DOB: 1934-06-04   Cancelled Treatment:    Reason Eval/Treat Not Completed: Other (comment).  Chart reviewed and attempted to see pt, but pt resting upon arrival.  Spoke with son and answered questions he had.  Will continue to monitor and see pt at later date/time as medically appropriate.   Gwenlyn Saran, PT, DPT 12/27/21, 2:10 PM

## 2021-12-27 NOTE — Progress Notes (Addendum)
Pt was started on miralx and senna yesterday but per pt and wife pt bowel didn't move yet. NP Foust made aware. Will continue to monitor.  Update 0622: NP Foust ordered to administer miralax ealy. Will continue to monitor.

## 2021-12-27 NOTE — Progress Notes (Signed)
Occupational Therapy Treatment Patient Details Name: Ruben Green MRN: [id number] DOB: 29-Oct-1933 Today's Date: 12/27/2021   History of present illness Ruben Green is a 88 y.o. male with medical history significant for CAD, COPD, HTN, prostate cancer, AAA, history of postoperative hypoxia s/p hip replacement in February 2023, hospitalized in February 2023 with sepsis secondary to pneumonia and some concern for CHF with echo showing grade 1 DD, discharged home on 3 L oxygen who presents to the ED by EMS in acute respiratory distress that started during the afternoon hours with O2 sats 88% on room air.   OT comments  Chart reviewed, pt greeted in room with wife present, agreeable to OT tx session. Pt declines interpreter, requests wife assist with interpreter as needed. Tx session targeted progressing activity tolerance for improved independence in ADLs. supine>sit with supervision, STS with supervision, dynamic standing tasks (ADL item retrieval) with CGA, Grooming standing at sink with supervision, amb in hallway approx 200' with CGA-supervision with no AD, spo2 down to 85% on RA while amb, up to 92% on 2L via [place]. STep by step vcs for pacing.PT would benefit from ongoing OT to address deficits, continue education re: energy conservation techniques. Pt is left as received, NAD, all needs met. OT will follow acutely.    Recommendations for follow up therapy are one component of a multi-disciplinary discharge planning process, led by the attending physician.  Recommendations may be updated based on patient status, additional functional criteria and insurance authorization.    Follow Up Recommendations  Home health OT    Assistance Recommended at Discharge Set up Supervision/Assistance  Patient can return home with the following  A little help with walking and/or transfers;A little help with bathing/dressing/bathroom   Equipment Recommendations  None recommended by OT     Recommendations for Other Services      Precautions / Restrictions Precautions Precautions: Fall Restrictions Weight Bearing Restrictions: No       Mobility Bed Mobility                    Transfers                         Balance                                           ADL either performed or assessed with clinical judgement   ADL Overall ADL's : Needs assistance/impaired                                       General ADL Comments: supine>sit with supervision, STS with supervision, dynamic standing tasks (ADL item retrieval) with CGA, Grooming standing at sink with supervision, amb in hallway approx 200' with CGA-supervision with no AD, spo2 down to 85% on RA while amb, up to 92% on 2L via [place]. STep by step vcs for pacing.    Extremity/Trunk Assessment              Vision       Perception     Praxis      Cognition Arousal/Alertness: Awake/alert Behavior During Therapy: WFL for tasks assessed/performed Overall Cognitive Status: Within Functional Limits for tasks assessed  Exercises Other Exercises Other Exercises: edu pt and wife re energy conservation techniques    Shoulder Instructions       General Comments      Pertinent Vitals/ Pain       Pain Assessment Pain Assessment: No/denies pain  Home Living                                          Prior Functioning/Environment              Frequency  Min 2X/week        Progress Toward Goals  OT Goals(current goals can now be found in the care plan section)  Progress towards OT goals: Progressing toward goals     Plan Discharge plan remains appropriate    Co-evaluation                 AM-PAC OT "6 Clicks" Daily Activity     Outcome Measure   Help from another person eating meals?: None Help from another person taking care of personal grooming?:  A Little Help from another person toileting, which includes using toliet, bedpan, or urinal?: A Little Help from another person bathing (including washing, rinsing, drying)?: A Little Help from another person to put on and taking off regular upper body clothing?: None Help from another person to put on and taking off regular lower body clothing?: A Little 6 Click Score: 20    End of Session Equipment Utilized During Treatment: Gait belt  OT Visit Diagnosis: Unsteadiness on feet (R26.81)   Activity Tolerance Patient tolerated treatment well   Patient Left in bed;with call bell/phone within reach   Nurse Communication Mobility status        Time: 7356-7014 OT Time Calculation (min): 22 min  Charges: OT General Charges $OT Visit: 1 Visit OT Treatments $Self Care/Home Management : 8-22 mins  Shanon Payor, OTD OTR/L  12/27/21, 4:58 PM

## 2021-12-27 NOTE — Plan of Care (Signed)
  Problem: Clinical Measurements: Goal: Diagnostic test results will improve Outcome: Progressing   Problem: Clinical Measurements: Goal: Signs and symptoms of infection will decrease Outcome: Progressing   Problem: Clinical Measurements: Goal: Respiratory complications will improve Outcome: Progressing   Problem: Clinical Measurements: Goal: Cardiovascular complication will be avoided Outcome: Progressing   Problem: Pain Managment: Goal: General experience of comfort will improve Outcome: Progressing   Problem: Safety: Goal: Ability to remain free from injury will improve Outcome: Progressing

## 2021-12-27 NOTE — Progress Notes (Signed)
PULMONOLOGY         Date: 12/27/2021,   MRN# [id number] Ruben Green 05/13/1934     AdmissionWeight: 77.1 kg                 CurrentWeight: 76.7 kg  Referring provider: Dr Kurtis Bushman    CHIEF COMPLAINT:   Acute on chronic hypoxemic respiratory failure   HISTORY OF PRESENT ILLNESS   This is an 88 yo male with history of CAD, COPD, HTN, prostate cancer, AAA, history, hip replacement 07/2021, s/p CAP/AECHF in 07/2021 hospitalized at that time , TTE with HFpEF, recent onset of hypoxemia chronically requiring supplemental O2 at low level 1-3L/min [place]. He was noted to be hypoxemic on arrival to ED and required BIPAP.  Patient was febrile at the time with SIRS criteria met including tachycardia and tachypnea.  He had an arterial blood gas performed with significant hypoxemia on FiO2 45% however pH was within reference range.  He does have chronic anemia and renal impairment.  BNP was moderately elevated at 415 on arrival.  Empirically was treated with broad-spectrum antibiotics with cefepime and vancomycin as well as steroids utilizing IV Solu-Medrol.  He was diuresed in the ER with Lasix intravenously.  He was admitted with acute hypoxemic respiratory failure with multi focal pneumonia.  Cardiology consultation was placed for additional evaluation due to significant cardiac history.  Pulmonary consultation for severe recurrent hypoxemic failure and rehospitalization.  His CAT scan from February 2023 was reviewed by me with findings of significant centrilobular emphysema as well as a moderate volume hiatal hernia suspicious for recurrent aspiration.  At that time he also had consolidation of the right lower lobe and bilateral mild pleural effusion with associated atelectasis.  12/27/21- patient may dc home with close follow up on outpatient basis. Antibiotics switched to PO augmentin and steroids swtiched to po prednisone. Can finish 4 more days of bid augmentin at current dose and  prednisone 51m with tapering by 549mdaily    PAST MEDICAL HISTORY   Past Medical History:  Diagnosis Date   AAA (abdominal aortic aneurysm) ([facility]   a.) s/p repair in 2005   Anemia    Anginal pain ([facility]   Anxiety    Arthritis    B12 deficiency    Bilateral cataracts    a.) s/p BILATERAL extractions in 2018   BPH with obstruction/lower urinary tract symptoms    CAD (coronary artery disease)    Carotid artery stenosis    a.) s/p CEA on the RIGHT   COPD (chronic obstructive pulmonary disease) ([facility]   Diastolic dysfunction 0281/06/7508 a.)  TTE 07/29/2019: EF 50-55%; LA mildly enlarged; G1DD.   DOE (dyspnea on exertion)    Elevated PSA    Environmental and seasonal allergies    History of 2019 novel coronavirus disease (COVID-19)    History of kidney stones    HLD (hyperlipidemia)    HOH (hard of hearing)    HTN (hypertension)    Incomplete bladder emptying    Macular degeneration    Prostate cancer (HCC)    RBBB (right bundle branch block)    Valvular insufficiency    a.) TTE 07/29/2019: LVEF 50-55%; LA mild enlarged; triv AR/PR, mild MR, mod TR.     SURGICAL HISTORY   Past Surgical History:  Procedure Laterality Date   ABDOMINAL AORTIC ANEURYSM REPAIR     CATARACT EXTRACTION W/PHACO Right 09/25/2016   Procedure: CATARACT EXTRACTION PHACO AND INTRAOCULAR  LENS PLACEMENT (IOC);  Surgeon: Birder Robson, MD;  Location: [facility];  Service: Ophthalmology;  Laterality: Right;  [place] 01:01 AP% 17.4 CDE 10.75 Fluid pack lot # [id number]   CATARACT EXTRACTION W/PHACO Left 10/16/2016   Procedure: CATARACT EXTRACTION PHACO AND INTRAOCULAR LENS PLACEMENT ([place]) Suture placed in Left eye;  Surgeon: Birder Robson, MD;  Location: [facility];  Service: Ophthalmology;  Laterality: Left;  [place] 2:06.8 AP% 22.2 CDE 28.17 Fluid pack lot # [id number]   COLONOSCOPY WITH PROPOFOL N/A 03/31/2019   Procedure: COLONOSCOPY WITH PROPOFOL;  Surgeon: Lollie Sails, MD;  Location: [facility] END[facility];   Service: Endoscopy;  Laterality: N/A;   COLONOSCOPY WITH PROPOFOL N/A 09/09/2020   Procedure: COLONOSCOPY WITH PROPOFOL;  Surgeon: Lesly Rubenstein, MD;  Location: [facility] ENDOSCOPY;  Service: Endoscopy;  Laterality: N/A;   CYSTOSCOPY W/ RETROGRADES Bilateral 02/23/2020   Procedure: CYSTOSCOPY WITH RETROGRADE PYELOGRAM;  Surgeon: Abbie Sons, MD;  Location: [facility] OR[facility];  Service: Urology;  Laterality: Bilateral;   CYSTOSCOPY WITH BIOPSY N/A 02/23/2020   Procedure: CYSTOSCOPY WITH BIOPSY;  Surgeon: Abbie Sons, MD;  Location: [facility];  Service: Urology;  Laterality: N/A;   EYE SURGERY     HERNIA REPAIR     x 2   INGUINAL HERNIA REPAIR Right 12/02/2015   Procedure: HERNIA REPAIR INGUINAL ADULT;  Surgeon: Leonie Green, MD;  Location: [facility] ORS;  Service: General;  Laterality: Right;   PROSTATE SURGERY  2012   Right Carotid Endarterectomy     TOTAL HIP ARTHROPLASTY Right 08/01/2021   Procedure: TOTAL HIP ARTHROPLASTY;  Surgeon: Corky Mull, MD;  Location: [facility] ORS;  Service: Orthopedics;  Laterality: Right;     FAMILY HISTORY   Family History  Problem Relation Age of Onset   Cancer Brother        2000   Prostate cancer Neg Hx    Bladder Cancer Neg Hx    Kidney cancer Neg Hx      SOCIAL HISTORY   Social History   Tobacco Use   Smoking status: Former    Types: Cigarettes    Quit date: 11/30/1998    Years since quitting: 23.0   Smokeless tobacco: Former   Tobacco comments:    quit 10 years ago  Vaping Use   Vaping Use: Never used  Substance Use Topics   Alcohol use: Not Currently    Comment: occasional   Drug use: No     MEDICATIONS    Home Medication:    Current Medication:  Current Facility-Administered Medications:    acetaminophen (TYLENOL) tablet 650 mg, 650 mg, Oral, Q4H PRN, Athena Masse, MD, 650 mg at 12/26/21 2114   amLODipine (NORVASC) tablet 10 mg, 10 mg, Oral, Daily, Kurtis Bushman, Sahar, MD, 10 mg at 12/27/21 0836   amoxicillin-clavulanate  (AUGMENTIN) 875-125 MG per tablet 1 tablet, 1 tablet, Oral, Q12H, Ottie Glazier, MD, 1 tablet at 12/27/21 0836   aspirin EC tablet 81 mg, 81 mg, Oral, Daily, Judd Gaudier V, MD, 81 mg at 12/27/21 0836   atorvastatin (LIPITOR) tablet 40 mg, 40 mg, Oral, Daily, Judd Gaudier V, MD, 40 mg at 12/27/21 0836   enoxaparin (LOVENOX) injection 40 mg, 40 mg, Subcutaneous, Q24H, Amery, Sahar, MD, 40 mg at 12/26/21 2113   guaiFENesin (MUCINEX) 12 hr tablet 600 mg, 600 mg, Oral, BID, Kurtis Bushman, Sahar, MD, 600 mg at 12/27/21 0836   hydrALAZINE (APRESOLINE) injection 10 mg, 10 mg, Intravenous, Q6H PRN, Nolberto Hanlon, MD, 10 mg at 12/27/21 0417  ipratropium-albuterol (DUONEB) 0.5-2.5 (3) MG/3ML nebulizer solution 3 mL, 3 mL, Nebulization, Q4H PRN, Nolberto Hanlon, MD, 3 mL at 12/26/21 1935   iron polysaccharides (NIFEREX) capsule 150 mg, 150 mg, Oral, BID, Nolberto Hanlon, MD, 150 mg at 12/27/21 0843   LORazepam (ATIVAN) tablet 0.5 mg, 0.5 mg, Oral, Daily PRN, Nolberto Hanlon, MD   methylPREDNISolone sodium succinate (SOLU-MEDROL) 40 mg/mL injection 40 mg, 40 mg, Intravenous, Daily, Ottie Glazier, MD, 40 mg at 12/27/21 0836   metoprolol succinate (TOPROL-XL) 24 hr tablet 50 mg, 50 mg, Oral, Daily, Tang, Lily Michelle, PA-C, 50 mg at 12/27/21 7106   nitroGLYCERIN (NITROSTAT) SL tablet 0.4 mg, 0.4 mg, Sublingual, Q5 Min x 3 PRN, Athena Masse, MD   ondansetron [facility]) injection 4 mg, 4 mg, Intravenous, Q6H PRN, Athena Masse, MD   polyethylene glycol (MIRALAX / GLYCOLAX) packet 17 g, 17 g, Oral, Daily, Nolberto Hanlon, MD, 17 g at 12/27/21 2694   senna-docusate (Senokot-S) tablet 1 tablet, 1 tablet, Oral, BID, Nolberto Hanlon, MD, 1 tablet at 12/27/21 0836   tamsulosin (FLOMAX) capsule 0.4 mg, 0.4 mg, Oral, Daily, Nolberto Hanlon, MD, 0.4 mg at 12/27/21 0836    ALLERGIES   Oxycodone     REVIEW OF SYSTEMS    Review of Systems:  Gen:  Denies  fever, sweats, chills weigh loss  HEENT: Denies blurred vision, double  vision, ear pain, eye pain, hearing loss, nose bleeds, sore throat Cardiac:  No dizziness, chest pain or heaviness, chest tightness,edema Resp:   reports dyspnea chronically  Gi: Denies swallowing difficulty, stomach pain, nausea or vomiting, diarrhea, constipation, bowel incontinence Gu:  Denies bladder incontinence, burning urine Ext:   Denies Joint pain, stiffness or swelling Skin: Denies  skin rash, easy bruising or bleeding or hives Endoc:  Denies polyuria, polydipsia , polyphagia or weight change Psych:   Denies depression, insomnia or hallucinations   Other:  All other systems negative   VS: BP (!) 160/75 (BP Location: Left Arm)   Pulse 84   Temp 97.8 F (36.6 C) (Oral)   Resp 18   Ht _0  (1.575 m)   Wt 76.7 kg   SpO2 91%   BMI 30.91 kg/m      PHYSICAL EXAM    GENERAL:NAD, no fevers, chills, no weakness no fatigue HEAD: Normocephalic, atraumatic.  EYES: Pupils equal, round, reactive to light. Extraocular muscles intact. No scleral icterus.  MOUTH: Moist mucosal membrane. Dentition intact. No abscess noted.  EAR, NOSE, THROAT: Clear without exudates. No external lesions.  NECK: Supple. No thyromegaly. No nodules. No JVD.  PULMONARY: decreased breath sounds with mild rhonchi worse at bases bilaterally.  CARDIOVASCULAR: S1 and S2. Regular rate and rhythm. No murmurs, rubs, or gallops. No edema. Pedal pulses 2+ bilaterally.  GASTROINTESTINAL: Soft, nontender, nondistended. No masses. Positive bowel sounds. No hepatosplenomegaly.  MUSCULOSKELETAL: No swelling, clubbing, or edema. Range of motion full in all extremities.  NEUROLOGIC: Cranial nerves II through XII are intact. No gross focal neurological deficits. Sensation intact. Reflexes intact.  SKIN: No ulceration, lesions, rashes, or cyanosis. Skin warm and dry. Turgor intact.  PSYCHIATRIC: Mood, affect within normal limits. The patient is awake, alert and oriented x 3. Insight, judgment intact.       IMAGING             ASSESSMENT/PLAN   Acute on chronic hypoxemic respiratory failure  -Patient had a repeat transthoracic echo performed November 24, 2021 with poor windows but overall findings are not very impressive with  EF 50 to 37% and mild systolic and diastolic dysfunction.  Does not seem to be enough to fully explain current findings with severe hypoxemia -BNP is moderately elevated at 415 which does point towards cardiac etiology and this is likely a component of his current issue although it may be secondary to whatever is causing his fevers. He had Multiple UTIs in the past, this time Urine culture is not bad.   -Due to moderate hiatal hernia and like to perform SLP with barium to evaluate for regurgitation and aspiration -agree with blood cultures and empiric abx for CAP at this time although we may need repeat CT to get better view of her lung parenchyma   Acute on chronic normocytic anemia   -His goal transfusion threshold should be hemoglobin 8 or more -This is also a component of his current hypoxemic failure and should be worked up on outpatient basis with heme-onc service   Bilateral pleural effusions with compressive atelectasis -Patient does have renal impairment and cardiac impairment which may be culprit of -May have component of hypertensive nephropathy, will recommend DC nonessential nephrotoxins GFR is currently reduced from 56-41. -Recruitment maneuvers with MetaNeb and albuterol.  Use due to its been active for like the last hour of course now acidity     Thank you for allowing me to participate in the care of this patient.   Patient/Family are satisfied with care plan and all questions have been answered.    Provider disclosure: Patient with at least one acute or chronic illness or injury that poses a threat to life or bodily function and is being managed actively during this encounter.  All of the below services have been performed independently by signing  provider:  review of prior documentation from internal and or external health records.  Review of previous and current lab results.  Interview and comprehensive assessment during patient visit today. Review of current and previous chest radiographs/CT scans. Discussion of management and test interpretation with health care team and patient/family.   This document was prepared using Dragon voice recognition software and may include unintentional dictation errors.     Ottie Glazier, M.D.  Division of Pulmonary & Critical Care Medicine

## 2021-12-27 NOTE — Plan of Care (Signed)
  Problem: Activity: Goal: Ability to tolerate increased activity will improve Outcome: Progressing   Problem: Cardiac: Goal: Ability to achieve and maintain adequate cardiovascular perfusion will improve Outcome: Progressing   Problem: Clinical Measurements: Goal: Diagnostic test results will improve Outcome: Progressing

## 2021-12-27 NOTE — Progress Notes (Signed)
[facility] Cardiology  CARDIOLOGY CONSULT NOTE  Patient ID: Ruben Green MRN: [id number] DOB/AGE: 07/30/1933 88 y.o.  Admit date: 12/23/2021 Referring Physician Nolberto Hanlon Primary Physician Tracie Harrier, MD Primary Cardiologist Serafina Royals Reason for Consultation Elevated troponin, sepsis  HPI:  [name] is a 88 year old male with history of carotid stenosis s/p right CEA 2021, AAA s/p repair 2005, coronary atherosclerosis (reported cardiac cath several years ago), hypertension who presents with shortness of breath, cough, fever.  Cardiology is consulted for elevated troponin.  Interval history: -no acute events -remains on supplemental oxygen  -no chest pain   Review of systems complete and found to be negative unless listed above     Past Medical History:  Diagnosis Date   AAA (abdominal aortic aneurysm) ([place])    a.) s/p repair in 2005   Anemia    Anginal pain ([place])    Anxiety    Arthritis    B12 deficiency    Bilateral cataracts    a.) s/p BILATERAL extractions in 2018   BPH with obstruction/lower urinary tract symptoms    CAD (coronary artery disease)    Carotid artery stenosis    a.) s/p CEA on the RIGHT   COPD (chronic obstructive pulmonary disease) ([place])    Diastolic dysfunction 13/24/4010   a.)  TTE 07/29/2019: EF 50-55%; LA mildly enlarged; G1DD.   DOE (dyspnea on exertion)    Elevated PSA    Environmental and seasonal allergies    History of 2019 novel coronavirus disease (COVID-19)    History of kidney stones    HLD (hyperlipidemia)    HOH (hard of hearing)    HTN (hypertension)    Incomplete bladder emptying    Macular degeneration    Prostate cancer (HCC)    RBBB (right bundle branch block)    Valvular insufficiency    a.) TTE 07/29/2019: LVEF 50-55%; LA mild enlarged; triv AR/PR, mild MR, mod TR.    Past Surgical History:  Procedure Laterality Date   ABDOMINAL AORTIC ANEURYSM REPAIR     CATARACT EXTRACTION W/PHACO Right 09/25/2016    Procedure: CATARACT EXTRACTION PHACO AND INTRAOCULAR LENS PLACEMENT (IOC);  Surgeon: Birder Robson, MD;  Location: [facility] ORS;  Service: Ophthalmology;  Laterality: Right;  [place] 01:01 AP% 17.4 CDE 10.75 Fluid pack lot # [id number]   CATARACT EXTRACTION W/PHACO Left 10/16/2016   Procedure: CATARACT EXTRACTION PHACO AND INTRAOCULAR LENS PLACEMENT ([place]) Suture placed in Left eye;  Surgeon: Birder Robson, MD;  Location: [facility] ORS;  Service: Ophthalmology;  Laterality: Left;  [place] 2:06.8 AP% 22.2 CDE 28.17 Fluid pack lot # [id number]   COLONOSCOPY WITH PROPOFOL N/A 03/31/2019   Procedure: COLONOSCOPY WITH PROPOFOL;  Surgeon: Lollie Sails, MD;  Location: [facility] ENDOSCOPY;  Service: Endoscopy;  Laterality: N/A;   COLONOSCOPY WITH PROPOFOL N/A 09/09/2020   Procedure: COLONOSCOPY WITH PROPOFOL;  Surgeon: Lesly Rubenstein, MD;  Location: [facility] ENDOSCOPY;  Service: Endoscopy;  Laterality: N/A;   CYSTOSCOPY W/ RETROGRADES Bilateral 02/23/2020   Procedure: CYSTOSCOPY WITH RETROGRADE PYELOGRAM;  Surgeon: Abbie Sons, MD;  Location: [facility] ORS;  Service: Urology;  Laterality: Bilateral;   CYSTOSCOPY WITH BIOPSY N/A 02/23/2020   Procedure: CYSTOSCOPY WITH BIOPSY;  Surgeon: Abbie Sons, MD;  Location: [facility] ORS;  Service: Urology;  Laterality: N/A;   EYE SURGERY     HERNIA REPAIR     x 2   INGUINAL HERNIA REPAIR Right 12/02/2015   Procedure: HERNIA REPAIR INGUINAL ADULT;  Surgeon: Leonie Green, MD;  Location:  [facility] ORS;  Service: General;  Laterality: Right;   PROSTATE SURGERY  2012   Right Carotid Endarterectomy     TOTAL HIP ARTHROPLASTY Right 08/01/2021   Procedure: TOTAL HIP ARTHROPLASTY;  Surgeon: Corky Mull, MD;  Location: [facility] ORS;  Service: Orthopedics;  Laterality: Right;    Medications Prior to Admission  Medication Sig Dispense Refill Last Dose   acetaminophen (TYLENOL) 500 MG tablet Take 1-2 tablets (500-1,000 mg total) by mouth every 6 (six) hours as needed for mild pain. 60  tablet 0    Fluticasone-Umeclidin-Vilant (TRELEGY ELLIPTA) 100-62.5-25 MCG/INH AEPB Inhale 2 puffs into the lungs daily.   12/22/2021 at 2300   iron polysaccharides (NIFEREX) 150 MG capsule Take 150 mg by mouth 2 (two) times daily.   unknown at unknown   LORazepam (ATIVAN) 0.5 MG tablet Take 0.5 mg by mouth daily as needed for anxiety.   prn at prn   losartan (COZAAR) 100 MG tablet Take 100 mg by mouth daily.    12/22/2021 at 2300   tamsulosin (FLOMAX) 0.4 MG CAPS capsule Take 1 capsule (0.4 mg total) by mouth daily. 30 capsule 0 12/22/2021 at 1100   apixaban (ELIQUIS) 2.5 MG TABS tablet Take 1 tablet (2.5 mg total) by mouth 2 (two) times daily. (Patient not taking: Reported on 12/23/2021) 30 tablet 0 Not Taking   atorvastatin (LIPITOR) 40 MG tablet Take 40 mg by mouth daily. (Patient not taking: Reported on 12/23/2021)   Not Taking   iron polysaccharides (NIFEREX) 150 MG capsule Take 150 mg by mouth daily. (Patient not taking: Reported on 12/23/2021)   Not Taking   ondansetron (ZOFRAN) 4 MG tablet Take 1 tablet (4 mg total) by mouth every 6 (six) hours as needed for nausea. (Patient not taking: Reported on 12/23/2021) 30 tablet 0 Not Taking    Social History   Socioeconomic History   Marital status: Married    Spouse name: Not on file   Number of children: Not on file   Years of education: Not on file   Highest education level: Not on file  Occupational History   Not on file  Tobacco Use   Smoking status: Former    Types: Cigarettes    Quit date: 11/30/1998    Years since quitting: 23.0   Smokeless tobacco: Former   Tobacco comments:    quit 10 years ago  Vaping Use   Vaping Use: Never used  Substance and Sexual Activity   Alcohol use: Not Currently    Comment: occasional   Drug use: No   Sexual activity: Not on file  Other Topics Concern   Not on file  Social History Narrative   Not on file   Social Determinants of Health   Financial Resource Strain: Not on file  Food Insecurity: Not  on file  Transportation Needs: Not on file  Physical Activity: Not on file  Stress: Not on file  Social Connections: Not on file  Intimate Partner Violence: Not on file    Family History  Problem Relation Age of Onset   Cancer Brother        2000   Prostate cancer Neg Hx    Bladder Cancer Neg Hx    Kidney cancer Neg Hx      PHYSICAL EXAM  General: Elderly male, sitting upright in hospital bed with wife at bedside HEENT:  Normocephalic and atraumatic Neck:  No JVD.  Lungs: Normal respiratory effort on 2 L by [place].  Decreased breath sounds in  the bases with crackles bilaterally Heart: HRRR . Normal S1 and S2 without gallops or murmurs.  Abdomen: Nondistended appearing  Msk: Normal strength and tone for age. Extremities: No clubbing, cyanosis or edema.   Neuro: Alert and oriented X 3. Psych:  Good affect, responds appropriately  Labs:   Lab Results  Component Value Date   WBC 9.0 12/26/2021   HGB 8.5 (L) 12/26/2021   HCT 28.2 (L) 12/26/2021   MCV 86.5 12/26/2021   PLT 231 12/26/2021    Recent Labs  Lab 12/23/21 0140 12/24/21 0526 12/27/21 0434  NA 142   < > 136  K 5.3*   < > 4.8  CL 113*   < > 107  CO2 23   < > 22  BUN 23   < > 60*  CREATININE 1.25*   < > 1.51*  CALCIUM 8.5*   < > 8.6*  PROT 6.6  --   --   BILITOT 0.7  --   --   ALKPHOS 73  --   --   ALT 25  --   --   AST 28  --   --   GLUCOSE 164*   < > 168*   < > = values in this interval not displayed.    Lab Results  Component Value Date   TROPONINI <0.03 10/19/2016   No results found for: "CHOL" No results found for: "HDL" No results found for: "LDLCALC" No results found for: "TRIG" No results found for: "CHOLHDL" No results found for: "LDLDIRECT"    Radiology: DG ESOPHAGUS W DOUBLE CM (HD)  Result Date: 12/26/2021 INDICATION: Patient with multiple admissions for pneumonia concern for aspiration and asked to evaluate hiatal hernia. EXAM: ESOPHAGUS/BARIUM SWALLOW/TABLET STUDY TECHNIQUE: Single  contrast examination was performed using thin barium liquid. FLUOROSCOPY TIME:  Radiation Exposure Index (as provided by the fluoroscopic device): 12.10 mGy COMPARISON:  CT angio chest August 08, 2021. PROCEDURE: Normal pharyngeal anatomy and motility. No laryngeal penetration or tracheal aspiration. Contrast flowed freely through the esophagus without evidence of a stricture or mass. Normal esophageal mucosa without evidence of irregularity or ulceration. Significant esophageal dysmotility is seen with associated tertiary contractions and esophageal spasm. No evidence of reflux. Moderate sized hiatal hernia was demonstrated. A barium tablet was not given since the patient was unable to stand and the procedure was performed lying down. COMPLICATIONS: NONE. IMPRESSION: 1.  No laryngeal penetration or tracheal aspiration. 2.  Normal pharyngeal anatomy and motility. 3. No evidence of esophageal stricture, a barium tablet was not given since the patient was unable to stand and the procedure was performed lying down. 4. Significant esophageal dysmotility is seen with associated tertiary contractions/esophageal spasm. 5. Moderate hiatal hernia. 6. No evidence of gastroesophageal reflux. This exam was performed by Tsosie Billing PA-C, and was supervised and interpreted by Dr. Candise Che. Electronically Signed   By: Marijo Sanes M.D.   On: 12/26/2021 11:20   CT Chest High Resolution  Result Date: 12/25/2021 CLINICAL DATA:  Hypoxia EXAM: CT CHEST WITHOUT CONTRAST TECHNIQUE: Multidetector CT imaging of the chest was performed following the standard protocol without intravenous contrast. High resolution imaging of the lungs, as well as inspiratory and expiratory imaging, was performed. RADIATION DOSE REDUCTION: This exam was performed according to the departmental dose-optimization program which includes automated exposure control, adjustment of the mA and/or kV according to patient size and/or use of iterative  reconstruction technique. COMPARISON:  Chest CT dated August 08, 2021 FINDINGS: Cardiovascular: Normal heart size.  Trace pericardial fluid. Calcifications of the mitral valve and chordae tendineae. Severe left main, lad and circumflex coronary artery calcifications. Severe atherosclerotic disease of the thoracic aorta. Mediastinum/Nodes: Moderate hiatal hernia. Thyroid is unremarkable. No pathologically enlarged lymph nodes seen in the chest. Lungs/Pleura: Central airways are patent. No significant air trapping. Severe centrilobular emphysema. New bronchial wall thickening of the bilateral lower lobes, ground-glass opacity of the posterior right upper lobe and bilateral lower lobe consolidations. Trace left-greater-than-right pleural effusions. Upper Abdomen: Simple appearing cysts of the left kidney, no further follow-up imaging recommended. Diverticulosis. No acute abnormality. Musculoskeletal: No chest wall mass or suspicious bone lesions identified. IMPRESSION: 1. New bilateral lower lobe consolidations, ground-glass opacity of the posterior right upper lobe and lower lobe bronchial wall thickening, findings can be seen in the setting of infection or aspiration. Recommend follow-up chest CT in 3 months to ensure resolution. 2. No evidence of fibrotic interstitial lung disease. 3. Moderate hiatal hernia. 4. Trace left-greater-than-right pleural effusions. 5. Aortic Atherosclerosis (ICD10-I70.0) and Emphysema (ICD10-J43.9). Electronically Signed   By: Yetta Glassman M.D.   On: 12/25/2021 16:19   ECHOCARDIOGRAM COMPLETE  Result Date: 12/24/2021    ECHOCARDIOGRAM REPORT   Patient Name:   ERIQUE KASER Date of Exam: 12/24/2021 Medical Rec #:  [id number]           Height:       62.0 in Accession #:    [id number]          Weight:       170.0 lb Date of Birth:  [birth year]           BSA:          1.784 m Patient Age:    28 years            BP:           141/77 mmHg Patient Gender: M                   HR:            88 bpm. Exam Location:  [facility] Procedure: 2D Echo and Intracardiac Opacification Agent Indications:     NSTEMI I21.4  History:         Patient has prior history of Echocardiogram examinations, most                  recent 08/10/2021.  Sonographer:     Kathlen Brunswick RDCS Referring Phys:  [id number] Athena Masse Diagnosing Phys: Donnelly Angelica  Sonographer Comments: Technically difficult study due to poor echo windows and suboptimal apical window. Image acquisition challenging due to patient body habitus. IMPRESSIONS  1. Left ventricular ejection fraction, by estimation, is 50 to 55%. The left ventricle has low normal function. The left ventricle has no regional wall motion abnormalities. The left ventricular internal cavity size was mildly dilated. Left ventricular diastolic parameters are consistent with Grade I diastolic dysfunction (impaired relaxation).  2. Right ventricular systolic function is normal. The right ventricular size is normal.  3. The mitral valve is grossly normal. Trivial mitral valve regurgitation.  4. The aortic valve is grossly normal. Aortic valve regurgitation is not visualized. No aortic stenosis is present.  5. The inferior vena cava is normal in size with greater than 50% respiratory variability, suggesting right atrial pressure of 3 mmHg. Conclusion(s)/Recommendation(s): Technically difficult study. Compared to prior study, EF appears decreased, though quantification is difficult in setting of difficult imaging. FINDINGS  Left Ventricle: Left ventricular ejection  fraction, by estimation, is 50 to 55%. The left ventricle has low normal function. The left ventricle has no regional wall motion abnormalities. Definity contrast agent was given IV to delineate the left ventricular endocardial borders. The left ventricular internal cavity size was mildly dilated. There is no left ventricular hypertrophy. Left ventricular diastolic parameters are consistent with Grade I diastolic dysfunction  (impaired relaxation). Right Ventricle: The right ventricular size is normal. Right vetricular wall thickness was not well visualized. Right ventricular systolic function is normal. Left Atrium: Left atrial size was normal in size. Right Atrium: Right atrial size was not well visualized. Pericardium: There is no evidence of pericardial effusion. Mitral Valve: The mitral valve is grossly normal. Trivial mitral valve regurgitation. Tricuspid Valve: The tricuspid valve is grossly normal. Tricuspid valve regurgitation is trivial. Aortic Valve: The aortic valve is grossly normal. Aortic valve regurgitation is not visualized. No aortic stenosis is present. Aortic valve peak gradient measures 6.0 mmHg. Pulmonic Valve: The pulmonic valve was not well visualized. Aorta: The aortic root is normal in size and structure. Venous: The inferior vena cava is normal in size with greater than 50% respiratory variability, suggesting right atrial pressure of 3 mmHg. IAS/Shunts: No atrial level shunt detected by color flow Doppler.  LEFT VENTRICLE PLAX 2D LVIDd:         6.08 cm   Diastology LVIDs:         4.42 cm   LV e' medial:    6.53 cm/s LV PW:         1.09 cm   LV E/e' medial:  10.0 LV IVS:        1.10 cm   LV e' lateral:   4.57 cm/s LVOT diam:     2.00 cm   LV E/e' lateral: 14.4 LV SV:         63 LV SV Index:   35 LVOT Area:     3.14 cm  RIGHT VENTRICLE RV Basal diam:  3.10 cm RV S prime:     11.40 cm/s TAPSE (M-mode): 1.7 cm LEFT ATRIUM           Index        RIGHT ATRIUM           Index LA diam:      5.00 cm 2.80 cm/m   RA Area:     13.10 cm LA Vol (A4C): 42.2 ml 23.65 ml/m  RA Volume:   29.60 ml  16.59 ml/m  AORTIC VALVE                 PULMONIC VALVE AV Area (Vmax): 2.30 cm     PV Vmax:       0.73 m/s AV Vmax:        122.00 cm/s  PV Peak grad:  2.1 mmHg AV Peak Grad:   6.0 mmHg LVOT Vmax:      89.20 cm/s LVOT Vmean:     53.400 cm/s LVOT VTI:       0.200 m  AORTA Ao Root diam: 3.30 cm MITRAL VALVE MV Area (PHT): 5.27 cm      SHUNTS MV Decel Time: 144 msec     Systemic VTI:  0.20 m MV E velocity: 65.60 cm/s   Systemic Diam: 2.00 cm MV A velocity: 115.00 cm/s MV E/A ratio:  0.57 Donnelly Angelica Electronically signed by Donnelly Angelica Signature Date/Time: 12/24/2021/1:04:46 PM    Final    DG Chest Port 1 View  Result Date: 12/23/2021  CLINICAL DATA:  Shortness of breath EXAM: PORTABLE CHEST 1 VIEW COMPARISON:  08/09/2021 FINDINGS: Cardiac shadow is within normal limits. Aortic calcifications are again seen. Lungs are well aerated bilaterally. Mild vascular congestion is noted with interstitial edema. No bony abnormality is noted. IMPRESSION: Mild changes of CHF. Electronically Signed   By: Inez Catalina M.D.   On: 12/23/2021 02:01    EKG: Sinus tachycardia. RBBB.   ASSESSMENT AND PLAN:  Ruben Green is a 88 year old male with history of carotid stenosis s/p right CEA 2021, AAA s/p repair 2005, coronary atherosclerosis (reported cardiac cath several years ago), hypertension who presents with shortness of breath, cough, fever.  Cardiology is consulted for elevated troponin.  # Elevated troponin # Sepsis from likely pneumonia Patient has had cough and fever for a few days followed by respiratory distress on 12/22/2021 prompting his presentation.  In the setting he was discovered to be febrile and tachycardic.  Troponin increased to peak of 1969 without reported chest pain.  He was hypoxic on 12 L high flow nasal cannula on admission, weaned to 3 L today, with mild pulmonary infiltrates on chest x-ray out of proportion to level of hypoxia.  I suspect his troponin elevation is demand in the setting of pneumonia, though it is impossible to rule out acute coronary syndrome at this time.  EKG shows right bundle branch block without obvious acute ischemic changes. -Continue to treat underlying infection as you are doing - Continue 81 mg indefinitely -S/p heparin infusion x48 hours (ended 7/10) -Continue to hold lasix and home losartan 100  mg daily in setting of worsening renal function - improving as these have been held  -uptitrate metoprolol XL to '50mg'$  daily  -Monitor patient on telemetry while inpatient - Echocardiogram resulted with low normal LVEF, slightly decreased compared to prior study from February -Per Dr. Nehemiah Massed, will hold off on invasive ischemic evaluation at this time.AKI, active infection, and chronic severe anemia are all relative contraindications at this time.  -Will need close outpatient followup with Dr. Nehemiah Massed in 1-2 weeks after discharge.   This patient's plan of care was discussed and created with Dr. Nehemiah Massed and he is in agreement.    Signed: Tristan Schroeder, PA-C 12/27/2021, 10:11 AM

## 2021-12-28 DIAGNOSIS — I5033 Acute on chronic diastolic (congestive) heart failure: Secondary | ICD-10-CM | POA: Diagnosis not present

## 2021-12-28 DIAGNOSIS — J9601 Acute respiratory failure with hypoxia: Secondary | ICD-10-CM | POA: Diagnosis not present

## 2021-12-28 DIAGNOSIS — J441 Chronic obstructive pulmonary disease with (acute) exacerbation: Secondary | ICD-10-CM | POA: Diagnosis not present

## 2021-12-28 LAB — CULTURE, BLOOD (ROUTINE X 2)
Culture: NO GROWTH
Culture: NO GROWTH

## 2021-12-28 LAB — CBC
HCT: 33.1 % — ABNORMAL LOW (ref 39.0–52.0)
Hemoglobin: 10.1 g/dL — ABNORMAL LOW (ref 13.0–17.0)
MCH: 25.9 pg — ABNORMAL LOW (ref 26.0–34.0)
MCHC: 30.5 g/dL (ref 30.0–36.0)
MCV: 84.9 fL (ref 80.0–100.0)
Platelets: 337 10*3/uL (ref 150–400)
RBC: 3.9 MIL/uL — ABNORMAL LOW (ref 4.22–5.81)
RDW: 16.7 % — ABNORMAL HIGH (ref 11.5–15.5)
WBC: 10.7 10*3/uL — ABNORMAL HIGH (ref 4.0–10.5)
nRBC: 0 % (ref 0.0–0.2)

## 2021-12-28 LAB — BASIC METABOLIC PANEL
Anion gap: 6 (ref 5–15)
BUN: 53 mg/dL — ABNORMAL HIGH (ref 8–23)
CO2: 24 mmol/L (ref 22–32)
Calcium: 8.8 mg/dL — ABNORMAL LOW (ref 8.9–10.3)
Chloride: 106 mmol/L (ref 98–111)
Creatinine, Ser: 1.35 mg/dL — ABNORMAL HIGH (ref 0.61–1.24)
GFR, Estimated: 51 mL/min — ABNORMAL LOW (ref >60)
Glucose, Bld: 103 mg/dL — ABNORMAL HIGH (ref 70–99)
Potassium: 5.3 mmol/L — ABNORMAL HIGH (ref 3.5–5.1)
Sodium: 136 mmol/L (ref 135–145)

## 2021-12-28 LAB — POTASSIUM: Potassium: 4.9 mmol/L (ref 3.5–5.1)

## 2021-12-28 MED ORDER — LOSARTAN POTASSIUM 100 MG PO TABS: 100.0000 mg | ORAL_TABLET | Freq: Every day | ORAL | Status: DC

## 2021-12-28 MED ORDER — AMLODIPINE BESYLATE 10 MG PO TABS: 10.0000 mg | ORAL_TABLET | Freq: Every day | ORAL | 0 refills | Status: DC

## 2021-12-28 MED ORDER — CITALOPRAM HYDROBROMIDE 20 MG PO TABS
10.0000 mg | ORAL_TABLET | Freq: Every day | ORAL | Status: DC
  Administered 2021-12-28: 10 mg via ORAL
  Filled 2021-12-28: qty 1

## 2021-12-28 MED ORDER — CITALOPRAM HYDROBROMIDE 10 MG PO TABS: 10.0000 mg | ORAL_TABLET | Freq: Every day | ORAL | 0 refills | Status: DC

## 2021-12-28 MED ORDER — PREDNISONE 5 MG PO TABS: ORAL_TABLET | ORAL | 0 refills | Status: DC

## 2021-12-28 MED ORDER — SODIUM ZIRCONIUM CYCLOSILICATE 10 G PO PACK
10.0000 g | PACK | Freq: Once | ORAL | Status: AC
  Administered 2021-12-28: 10 g via ORAL
  Filled 2021-12-28: qty 1

## 2021-12-28 MED ORDER — AMOXICILLIN-POT CLAVULANATE 875-125 MG PO TABS: 1.0000 | ORAL_TABLET | Freq: Two times a day (BID) | ORAL | 0 refills | Status: AC

## 2021-12-28 MED ORDER — ASPIRIN 81 MG PO TBEC: 81.0000 mg | DELAYED_RELEASE_TABLET | Freq: Every day | ORAL | Status: DC

## 2021-12-28 MED ORDER — METOPROLOL SUCCINATE ER 50 MG PO TB24: 50.0000 mg | ORAL_TABLET | Freq: Every day | ORAL | 0 refills | Status: DC

## 2021-12-28 NOTE — Progress Notes (Signed)
Physical Therapy Treatment Patient Details Name: Ruben Green MRN: [id number] DOB: Jul 03, 1933 Today's Date: 12/28/2021   History of Present Illness Ruben Green is a 88 y.o. male with medical history significant for CAD, COPD, HTN, prostate cancer, AAA, history of postoperative hypoxia s/p hip replacement in February 2023, hospitalized in February 2023 with sepsis secondary to pneumonia and some concern for CHF with echo showing grade 1 DD, discharged home on 3 L oxygen who presents to the ED by EMS in acute respiratory distress that started during the afternoon hours with O2 sats 88% on room air.    PT Comments    Pt was long sitting in bed on 3 L o2 upon arriving. His supportive spouse is present and continues to endorse pt's impulsivity at home. He is A and O x 4 and agreeable to session. Was able to ambulated 200+ ft without AD with 3L O2 + Sao2 >92%. Overall pt tolerated session well and is cleared from an acute PT standpoint for safe DC home when deemed medically appropriate. No PT needs identified at DC.    Recommendations for follow up therapy are one component of a multi-disciplinary discharge planning process, led by the attending physician.  Recommendations may be updated based on patient status, additional functional criteria and insurance authorization.  Follow Up Recommendations  No PT follow up     Assistance Recommended at Discharge PRN  Patient can return home with the following A little help with walking and/or transfers;A little help with bathing/dressing/bathroom   Equipment Recommendations  None recommended by PT       Precautions / Restrictions Precautions Precautions: Fall Restrictions Weight Bearing Restrictions: No     Mobility  Bed Mobility    General bed mobility comments: in recliner pre/post session    Transfers Overall transfer level: Needs assistance Equipment used: None Transfers: Sit to/from Stand Sit to Stand: Supervision    Ambulation/Gait Ambulation/Gait assistance: Modified independent (Device/Increase time) Gait Distance (Feet): 200 Feet Assistive device: None Gait Pattern/deviations: Step-through pattern, Decreased stride length Gait velocity: decreased     General Gait Details: pt ambulated on 3 L o2 without AD. Sao2 > 92% on 3 L. will require sao2 at DC. HR elevated appropriately but was within safe range    Balance Overall balance assessment: Needs assistance Sitting-balance support: Feet supported, No upper extremity supported Sitting balance-Leahy Scale: Good     Standing balance support: Bilateral upper extremity supported Standing balance-Leahy Scale: Good       Cognition Arousal/Alertness: Awake/alert Behavior During Therapy: WFL for tasks assessed/performed Overall Cognitive Status: Within Functional Limits for tasks assessed      General Comments: Pt is A and O x 4 but is impulsive               Pertinent Vitals/Pain Pain Assessment Pain Assessment: No/denies pain     PT Goals (current goals can now be found in the care plan section) Acute Rehab PT Goals Patient Stated Goal: to get back home. Progress towards PT goals: Not progressing toward goals - comment    Frequency    Min 2X/week      PT Plan Current plan remains appropriate       AM-PAC PT "6 Clicks" Mobility   Outcome Measure  Help needed turning from your back to your side while in a flat bed without using bedrails?: None Help needed moving from lying on your back to sitting on the side of a flat bed without using bedrails?: None  Help needed moving to and from a bed to a chair (including a wheelchair)?: None Help needed standing up from a chair using your arms (e.g., wheelchair or bedside chair)?: A Little Help needed to walk in hospital room?: A Little Help needed climbing 3-5 steps with a railing? : A Little 6 Click Score: 21    End of Session Equipment Utilized During Treatment: Gait  belt Activity Tolerance: Patient tolerated treatment well Patient left: in chair;with call bell/phone within reach;with chair alarm set;with family/visitor present Nurse Communication: Mobility status PT Visit Diagnosis: Unsteadiness on feet (R26.81);Muscle weakness (generalized) (M62.81)     Time: 6834-1962 PT Time Calculation (min) (ACUTE ONLY): 18 min  Charges:  $Gait Training: 8-22 mins                    Julaine Fusi PTA 12/28/21, 12:31 PM

## 2021-12-28 NOTE — TOC Progression Note (Signed)
Transition of Care [facility]) - Progression Note    Patient Details  Name: MERIK MIGNANO MRN: [id number] Date of Birth: 05/23/1934  [facility] CM/SW Contact  Candie Chroman, LCSW Phone Number: 12/28/2021, 10:56 AM  Clinical Narrative:  Ordered oxygen through [employer]. Patient and wife asked about portable oxygen. MD entered order for a POC eval.   Expected Discharge Plan: Home/Self Care Barriers to Discharge: Continued Medical Work up  Expected Discharge Plan and Services Expected Discharge Plan: Home/Self Care       Living arrangements for the past 2 months: Single Family Home                                       Social Determinants of Health (SDOH) Interventions    Readmission Risk Interventions     No data to display

## 2021-12-28 NOTE — TOC Transition Note (Signed)
Transition of Care [facility]) - CM/SW Discharge Note   Patient Details  Name: Ruben Green MRN: [id number] Date of Birth: [birth year]  [facility]) CM/SW Contact:  Candie Chroman, LCSW Phone Number: 12/28/2021, 3:12 PM   Clinical Narrative:  Patient has orders to discharge home today. Oxygen has been delivered to the room. Wife declined HHRN. No further concerns. CSW signing off.   Final next level of care: Home/Self Care Barriers to Discharge: Barriers Resolved   Patient Goals and CMS Choice Patient states their goals for this hospitalization and ongoing recovery are:: to return home CMS Medicare.gov Compare Post Acute Care list provided to:: Patient Choice offered to / list presented to : Patient  Discharge Placement                  Name of family member notified: Nayquan Evinger Patient and family notified of of transfer: 12/28/21  Discharge Plan and Services                DME Arranged: Oxygen DME Agency: [employer] Date DME Agency Contacted: 12/28/21   Representative spoke with at DME Agency: [place] [facility] Interventions     Readmission Risk Interventions     No data to display

## 2021-12-28 NOTE — Progress Notes (Addendum)
SATURATION QUALIFICATIONS: (This note is used to comply with regulatory documentation for home oxygen)  Patient Saturations on Room Air at Rest = 84%  Patient Saturations on Room Air while Ambulating = 82%  Patient Saturations on 3 Liters of oxygen while Ambulating = 94%  Please briefly explain why patient needs home oxygen:

## 2021-12-28 NOTE — Discharge Summary (Addendum)
Physician Discharge Summary  Ruben Green YOV:[id number] DOB: 1933/10/27 DOA: 12/23/2021  PCP: Tracie Harrier, MD  Admit date: 12/23/2021 Discharge date: 12/28/2021  Admitted From: home Disposition:  home   Recommendations for Outpatient Follow-up:  Follow up with PCP in 1-2 weeks F/u w/ cardio, Dr. Nehemiah Massed, in 1-2 weeks F/u w/ pulmon, Dr. Lanney Gins, in 1-2 weeks   Home Health: Equipment/Devices: 3L [place]  Discharge Condition: stable  CODE STATUS: full  Diet recommendation: Heart Healthy  Brief/Interim Summary: HPI was taken from Dr. Damita Dunnings: Ruben Green is a 88 y.o. male with medical history significant for CAD, COPD, HTN, prostate cancer, AAA, history of postoperative hypoxia s/p hip replacement in February 2023, hospitalized in February 2023 with sepsis secondary to pneumonia and some concern for CHF with echo showing grade 1 DD, discharged home on 3 L oxygen who presents to the ED by EMS in acute respiratory distress that started during the afternoon hours with O2 sats 88% on room air.  He had no chest pain.  Patient had a several day history of cough and congestion and fever.  O2 sat on room air with EMS was in the 70s and he was placed on NRB ED course and data review: Tmax rectal was 102.2 with tachycardia to 130 and respirations 28-35.  Patient was transitioned from NRB to BiPAP on arrival and desaturated to the 70s during transition with O2 sat improving to 96 after placement of BiPAP.  BP was elevated with systolic in the 741O. Labs: ABG on BiPAP at 14/8 FiO2 45% with pH 7.43 PCO2 33 and PO2 79. WBC 13,000 with lactic acid 1.4.  Hemoglobin 9.6 which appears to be baseline.  Creatinine 1.25, potassium 5.3 Troponin 82-1290 and BNP 415 COVID and flu negative EKG, personally reviewed and interpreted, sinus tachycardia at 130 with RBBB and no acute ST-T wave changes. Chest x-ray with mild CHF changes Patient was treated with IV Lasix, DuoNebs and Solu-Medrol, cefepime  and vancomycin.  Hospitalist consulted for admission for acute respiratory failure of multifactorial etiology.    As per Dr. Kurtis Bushman: 7/9 02 weaned to 8-9 L [place] with 02 sat 92%. Does become sob with conversation. Lasix held. Creatinine up   7/11 down to [facility].  CT chest:New bilateral lower lobe consolidations.  SPL consulted   As per Dr. Jimmye Norman 7/12-7/13/23: Unfortunately, pt was not able to be weaned off of supplemental oxygen prior to d/c so pt was d/c home w/ 3L [place]. Pt was also d/c home w/ po augmentin x 3 days more as well as a steroid taper as per pulmon. Pt will f/u outpatient w/ cardio & pulmon. Pt and pt's wife verbalized their understanding. Of note, pt's wife requested pt be started on a medication to help with his mood. Pt was started on citalopram 52m daily with the understanding the medication can take up to 6 weeks to work and the mg dosage may need to be increased. Pt's wife verbalized her understanding. For more information, please see previous progress/consult notes.    Discharge Diagnoses:  Principal Problem:   Acute respiratory failure with hypoxia (HCC) Active Problems:   [facility]   Acute on chronic diastolic CHF (congestive heart failure) (HCC)   Respiratory tract infection   COPD with acute exacerbation (HCC)   Essential (primary) hypertension   Abdominal aortic aneurysm (AAA) without rupture   Coronary artery disease involving native coronary artery of native heart without angina pectoris   Anemia   Elevated troponin  Acute hypoxic  respiratory failure: likely secondary to COPD, CHF exacerbation & pneumonia. Continue on augmentin x 3 days more, steroid taper, bronchodilators & encourage incentive spirometry   Elevated troponins: likely secondary to demand ischemia. ACS r/o. No invasive ischemic work-up as per cardio. F/u w/ cardio, Dr. Nehemiah Massed, in 1-2 weeks   Hx of CAD: continue on aspirin, metoprolol.   Pneumonia: continue on augmentin x 3 days more, steroid  taper, bronchodilators & encourage incentive spirometry   Acute on chronic diastolic CHF: appear euvolemic now. Holding lasix. Monitor I/Os  Sepsis: see Dr. Lupita Dawn note on how the pt met sepsis criteria. Sepsis resolved  AKI on CKDIIIa: Cr is labile. Avoid nephrotoxic meds  Hyperkalemia: lokelma x 1. Repeat K was WNL   COPD exacerbation: continue on steroids taper, bronchodilators & encourage incentive spirometry   HTN: continue on metoprolol, amlodipine. Holding losartan   AAA: will need outpatient f/u  Obesity: BMI 30.9. Complicates overall care & prognosis   Depression: severity unknown. Started on citalopram 61m daily   Discharge Instructions  Discharge Instructions     Diet - low sodium heart healthy   Complete by: As directed    Discharge instructions   Complete by: As directed    F/u w/ PCP in 1-2 weeks. F/u w/ cardio, Dr. KNehemiah Massed in 1-2 weeks. F/u w/ pulmon, Dr. ALanney Ginsin 1-2 weeks   Increase activity slowly   Complete by: As directed       Allergies as of 12/28/2021       Reactions   Oxycodone Other (See Comments)   Caused hallucinations/does not want anymore        Medication List     STOP taking these medications    apixaban 2.5 MG Tabs tablet Commonly known as: ELIQUIS   ondansetron 4 MG tablet Commonly known as: ZOFRAN       TAKE these medications    acetaminophen 500 MG tablet Commonly known as: TYLENOL Take 1-2 tablets (500-1,000 mg total) by mouth every 6 (six) hours as needed for mild pain.   amLODipine 10 MG tablet Commonly known as: NORVASC Take 1 tablet (10 mg total) by mouth daily. Start taking on: December 29, 2021   amoxicillin-clavulanate 875-125 MG tablet Commonly known as: AUGMENTIN Take 1 tablet by mouth every 12 (twelve) hours for 3 days.   aspirin EC 81 MG tablet Take 1 tablet (81 mg total) by mouth daily. Swallow whole. Start taking on: December 29, 2021   atorvastatin 40 MG tablet Commonly known as:  LIPITOR Take 40 mg by mouth daily.   citalopram 10 MG tablet Commonly known as: CELEXA Take 1 tablet (10 mg total) by mouth daily. Start taking on: December 29, 2021   iron polysaccharides 150 MG capsule Commonly known as: NIFEREX Take 150 mg by mouth 2 (two) times daily. What changed: Another medication with the same name was removed. Continue taking this medication, and follow the directions you see here.   LORazepam 0.5 MG tablet Commonly known as: ATIVAN Take 0.5 mg by mouth daily as needed for anxiety.   losartan 100 MG tablet Commonly known as: COZAAR Take 1 tablet (100 mg total) by mouth daily. Hold this medication until you see your cardiologist What changed: additional instructions   metoprolol succinate 50 MG 24 hr tablet Commonly known as: TOPROL-XL Take 1 tablet (50 mg total) by mouth daily. Take with or immediately following a meal. Start taking on: December 29, 2021   predniSONE 5 MG tablet Commonly known as: DELTASONE 526m  daily x 1 day, 77m daily x 1 day, 41mdaily x 1 day, 3561maily x 1 day, 68m71mily x 1 day, 25mg58mly x 1 day, 20mg 25my x 1 day, 15mg d94m x 1 day, 10mg da55mx 1 day, 5mg dail13m 1 day then stop   tamsulosin 0.4 MG Caps capsule Commonly known as: FLOMAX Take 1 capsule (0.4 mg total) by mouth daily.   Trelegy Ellipta 100-62.5-25 MCG/ACT Aepb Generic drug: Fluticasone-Umeclidin-Vilant Inhale 2 puffs into the lungs daily.               Durable Medical Equipment  (From admission, onward)           Start     Ordered   12/28/21 1047  For home use only DME Other see comment  Once       Comments: POC evaluate  Question:  Length of Need  Answer:  12 Months   12/28/21 1046   12/28/21 1029  For home use only DME oxygen  Once       Question Answer Comment  Length of Need 12 Months   Mode or (Route) Nasal cannula   Liters per Minute 3   Frequency Continuous (stationary and portable oxygen unit needed)   Oxygen conserving device Yes    Oxygen delivery system Gas      12/28/21 1028            Follow-up Information     Kowalski,Corey Skainsin 1 week(s).   Specialty: Cardiology Why: Appointment on Wednesday 01/17/2022 3:45pm. Please call often to attempt getting earlier appointment. Contact information: [id number] [id number]         AleskerovOttie Glazierow up.   Specialty: Pulmonary Disease Why: f/u in 1-2 weeks Contact information: [phone]       Allergies  Allergen Reactions   Oxycodone Other (See Comments)    Caused hallucinations/does not want anymore    Consultations: Cardio Pulmon    Procedures/Studies: DG ESOPHAGUS W DOUBLE CM (HD)  Result Date: 12/26/2021 INDICATION: Patient with multiple admissions for pneumonia concern for aspiration and asked to evaluate hiatal hernia. EXAM: ESOPHAGUS/BARIUM SWALLOW/TABLET STUDY TECHNIQUE: Single contrast examination was performed using thin barium liquid. FLUOROSCOPY TIME:  Radiation Exposure Index (as provided by the fluoroscopic device): 12.10 mGy COMPARISON:  CT angio chest August 08, 2021. PROCEDURE: Normal pharyngeal anatomy and motility. No laryngeal penetration or tracheal aspiration. Contrast flowed freely through the esophagus without evidence of a stricture or mass. Normal esophageal mucosa without evidence of irregularity or ulceration. Significant esophageal dysmotility is seen with associated tertiary contractions and esophageal spasm. No evidence of reflux. Moderate sized hiatal hernia was demonstrated. A barium tablet was not given since the patient was unable to stand and the procedure was performed lying down. COMPLICATIONS: NONE. IMPRESSION: 1.  No laryngeal penetration or tracheal aspiration. 2.  Normal pharyngeal anatomy and motility. 3. No evidence of esophageal stricture, a barium tablet was not  given since the patient was unable to stand and the procedure was performed lying down. 4. Significant esophageal dysmotility is seen with associated tertiary contractions/esophageal spasm. 5. Moderate hiatal hernia. 6. No evidence of gastroesophageal reflux. This exam was performed by Koreen MoTsosie Billingd was supervised and interpreted by Dr. GalleraniCandise Chenically Signed   By: P.  GalleMarijo Sanesn: 12/26/2021  11:20   CT Chest High Resolution  Result Date: 12/25/2021 CLINICAL DATA:  Hypoxia EXAM: CT CHEST WITHOUT CONTRAST TECHNIQUE: Multidetector CT imaging of the chest was performed following the standard protocol without intravenous contrast. High resolution imaging of the lungs, as well as inspiratory and expiratory imaging, was performed. RADIATION DOSE REDUCTION: This exam was performed according to the departmental dose-optimization program which includes automated exposure control, adjustment of the mA and/or kV according to patient size and/or use of iterative reconstruction technique. COMPARISON:  Chest CT dated August 08, 2021 FINDINGS: Cardiovascular: Normal heart size. Trace pericardial fluid. Calcifications of the mitral valve and chordae tendineae. Severe left main, lad and circumflex coronary artery calcifications. Severe atherosclerotic disease of the thoracic aorta. Mediastinum/Nodes: Moderate hiatal hernia. Thyroid is unremarkable. No pathologically enlarged lymph nodes seen in the chest. Lungs/Pleura: Central airways are patent. No significant air trapping. Severe centrilobular emphysema. New bronchial wall thickening of the bilateral lower lobes, ground-glass opacity of the posterior right upper lobe and bilateral lower lobe consolidations. Trace left-greater-than-right pleural effusions. Upper Abdomen: Simple appearing cysts of the left kidney, no further follow-up imaging recommended. Diverticulosis. No acute abnormality. Musculoskeletal: No chest wall mass or suspicious bone  lesions identified. IMPRESSION: 1. New bilateral lower lobe consolidations, ground-glass opacity of the posterior right upper lobe and lower lobe bronchial wall thickening, findings can be seen in the setting of infection or aspiration. Recommend follow-up chest CT in 3 months to ensure resolution. 2. No evidence of fibrotic interstitial lung disease. 3. Moderate hiatal hernia. 4. Trace left-greater-than-right pleural effusions. 5. Aortic Atherosclerosis (ICD10-I70.0) and Emphysema (ICD10-J43.9). Electronically Signed   By: Yetta Glassman M.D.   On: 12/25/2021 16:19   ECHOCARDIOGRAM COMPLETE  Result Date: 12/24/2021    ECHOCARDIOGRAM REPORT   Patient Name:   ASHAN CUEVA Date of Exam: 12/24/2021 Medical Rec #:  [id number]           Height:       62.0 in Accession #:    [id number]          Weight:       170.0 lb Date of Birth:  1934-02-20           BSA:          1.784 m Patient Age:    20 years            BP:           141/77 mmHg Patient Gender: M                   HR:           88 bpm. Exam Location:  [facility] Procedure: 2D Echo and Intracardiac Opacification Agent Indications:     NSTEMI I21.4  History:         Patient has prior history of Echocardiogram examinations, most                  recent 08/10/2021.  Sonographer:     Kathlen Brunswick RDCS Referring Phys:  [id number] [place] Diagnosing Phys: Donnelly Angelica  Sonographer Comments: Technically difficult study due to poor echo windows and suboptimal apical window. Image acquisition challenging due to patient body habitus. IMPRESSIONS  1. Left ventricular ejection fraction, by estimation, is 50 to 55%. The left ventricle has low normal function. The left ventricle has no regional wall motion abnormalities. The left ventricular internal cavity size was mildly dilated. Left ventricular diastolic parameters are consistent with Grade I diastolic  dysfunction (impaired relaxation).  2. Right ventricular systolic function is normal. The right ventricular size is  normal.  3. The mitral valve is grossly normal. Trivial mitral valve regurgitation.  4. The aortic valve is grossly normal. Aortic valve regurgitation is not visualized. No aortic stenosis is present.  5. The inferior vena cava is normal in size with greater than 50% respiratory variability, suggesting right atrial pressure of 3 mmHg. Conclusion(s)/Recommendation(s): Technically difficult study. Compared to prior study, EF appears decreased, though quantification is difficult in setting of difficult imaging. FINDINGS  Left Ventricle: Left ventricular ejection fraction, by estimation, is 50 to 55%. The left ventricle has low normal function. The left ventricle has no regional wall motion abnormalities. Definity contrast agent was given IV to delineate the left ventricular endocardial borders. The left ventricular internal cavity size was mildly dilated. There is no left ventricular hypertrophy. Left ventricular diastolic parameters are consistent with Grade I diastolic dysfunction (impaired relaxation). Right Ventricle: The right ventricular size is normal. Right vetricular wall thickness was not well visualized. Right ventricular systolic function is normal. Left Atrium: Left atrial size was normal in size. Right Atrium: Right atrial size was not well visualized. Pericardium: There is no evidence of pericardial effusion. Mitral Valve: The mitral valve is grossly normal. Trivial mitral valve regurgitation. Tricuspid Valve: The tricuspid valve is grossly normal. Tricuspid valve regurgitation is trivial. Aortic Valve: The aortic valve is grossly normal. Aortic valve regurgitation is not visualized. No aortic stenosis is present. Aortic valve peak gradient measures 6.0 mmHg. Pulmonic Valve: The pulmonic valve was not well visualized. Aorta: The aortic root is normal in size and structure. Venous: The inferior vena cava is normal in size with greater than 50% respiratory variability, suggesting right atrial pressure of 3  mmHg. IAS/Shunts: No atrial level shunt detected by color flow Doppler.  LEFT VENTRICLE PLAX 2D LVIDd:         6.08 cm   Diastology LVIDs:         4.42 cm   LV e' medial:    6.53 cm/s LV PW:         1.09 cm   LV E/e' medial:  10.0 LV IVS:        1.10 cm   LV e' lateral:   4.57 cm/s LVOT diam:     2.00 cm   LV E/e' lateral: 14.4 LV SV:         63 LV SV Index:   35 LVOT Area:     3.14 cm  RIGHT VENTRICLE RV Basal diam:  3.10 cm RV S prime:     11.40 cm/s TAPSE (M-mode): 1.7 cm LEFT ATRIUM           Index        RIGHT ATRIUM           Index LA diam:      5.00 cm 2.80 cm/m   RA Area:     13.10 cm LA Vol (A4C): 42.2 ml 23.65 ml/m  RA Volume:   29.60 ml  16.59 ml/m  AORTIC VALVE                 PULMONIC VALVE AV Area (Vmax): 2.30 cm     PV Vmax:       0.73 m/s AV Vmax:        122.00 cm/s  PV Peak grad:  2.1 mmHg AV Peak Grad:   6.0 mmHg LVOT Vmax:      89.20 cm/s LVOT  Vmean:     53.400 cm/s LVOT VTI:       0.200 m  AORTA Ao Root diam: 3.30 cm MITRAL VALVE MV Area (PHT): 5.27 cm     SHUNTS MV Decel Time: 144 msec     Systemic VTI:  0.20 m MV E velocity: 65.60 cm/s   Systemic Diam: 2.00 cm MV A velocity: 115.00 cm/s MV E/A ratio:  0.57 Donnelly Angelica Electronically signed by Donnelly Angelica Signature Date/Time: 12/24/2021/1:04:46 PM    Final    DG Chest Port 1 View  Result Date: 12/23/2021 CLINICAL DATA:  Shortness of breath EXAM: PORTABLE CHEST 1 VIEW COMPARISON:  08/09/2021 FINDINGS: Cardiac shadow is within normal limits. Aortic calcifications are again seen. Lungs are well aerated bilaterally. Mild vascular congestion is noted with interstitial edema. No bony abnormality is noted. IMPRESSION: Mild changes of CHF. Electronically Signed   By: Inez Catalina M.D.   On: 12/23/2021 02:01   (Echo, Carotid, EGD, Colonoscopy, ERCP)    Subjective: Pt c/o fatigue    Discharge Exam: Vitals:   12/28/21 0914 12/28/21 1127  BP:  (!) 127/58  Pulse:  78  Resp:  20  Temp:  97.6 F (36.4 C)  SpO2: 92% 92%   Vitals:    12/28/21 0458 12/28/21 0749 12/28/21 0914 12/28/21 1127  BP: (!) 148/67 130/66  (!) 127/58  Pulse:  83  78  Resp: _0 Temp:  (!) 97.4 F (36.3 C)  97.6 F (36.4 C)  TempSrc:      SpO2:  90% 92% 92%  Weight:      Height:        General: Pt is alert, awake, not in acute distress Cardiovascular: S1/S2 +, no rubs, no gallops Respiratory: diminished breath sounds b/l  Abdominal: Soft, NT, ND, bowel sounds + Extremities:  no cyanosis    The results of significant diagnostics from this hospitalization (including imaging, microbiology, ancillary and laboratory) are listed below for reference.     Microbiology: Recent Results (from the past 240 hour(s))  Resp Panel by RT-PCR (Flu A&B, Covid) Anterior Nasal Swab     Status: None   Collection Time: 12/23/21  1:40 AM   Specimen: Anterior Nasal Swab  Result Value Ref Range Status   SARS Coronavirus 2 by RT PCR NEGATIVE NEGATIVE Final    Comment: (NOTE) SARS-CoV-2 target nucleic acids are NOT DETECTED.  The SARS-CoV-2 RNA is generally detectable in upper respiratory specimens during the acute phase of infection. The lowest concentration of SARS-CoV-2 viral copies this assay can detect is 138 copies/mL. A negative result does not preclude SARS-Cov-2 infection and should not be used as the sole basis for treatment or other patient management decisions. A negative result may occur with  improper specimen collection/handling, submission of specimen other than nasopharyngeal swab, presence of viral mutation(s) within the areas targeted by this assay, and inadequate number of viral copies(<138 copies/mL). A negative result must be combined with clinical observations, patient history, and epidemiological information. The expected result is Negative.  Fact Sheet for Patients:  EntrepreneurPulse.com.au  Fact Sheet for Healthcare Providers:  IncredibleEmployment.be  This test is no t yet approved  or cleared by the [place] FDA and  has been authorized for detection and/or diagnosis of SARS-CoV-2 by FDA under an Emergency Use Authorization (EUA). This EUA will remain  in effect (meaning this test can be used) for the duration of the COVID-19 declaration under Section 564(b)(1) of the Act, 21 U.S.C.section 360bbb-3(b)(1),  unless the authorization is terminated  or revoked sooner.       Influenza A by PCR NEGATIVE NEGATIVE Final   Influenza B by PCR NEGATIVE NEGATIVE Final    Comment: (NOTE) The Xpert Xpress SARS-CoV-2/FLU/RSV plus assay is intended as an aid in the diagnosis of influenza from Nasopharyngeal swab specimens and should not be used as a sole basis for treatment. Nasal washings and aspirates are unacceptable for Xpert Xpress SARS-CoV-2/FLU/RSV testing.  Fact Sheet for Patients: EntrepreneurPulse.com.au  Fact Sheet for Healthcare Providers: IncredibleEmployment.be  This test is not yet approved or cleared by the [place] FDA and has been authorized for detection and/or diagnosis of SARS-CoV-2 by FDA under an Emergency Use Authorization (EUA). This EUA will remain in effect (meaning this test can be used) for the duration of the COVID-19 declaration under Section 564(b)(1) of the Act, 21 U.S.C. section 360bbb-3(b)(1), unless the authorization is terminated or revoked.  Performed at [facility], [address] [place], [place] [address]   Blood Culture (routine x 2)     Status: None   Collection Time: 12/23/21  1:40 AM   Specimen: BLOOD  Result Value Ref Range Status   Specimen Description BLOOD RIGHT ANTECUBITAL  Final   Special Requests   Final    BOTTLES DRAWN AEROBIC AND ANAEROBIC Blood Culture results may not be optimal due to an excessive volume of blood received in culture bottles   Culture   Final    NO GROWTH 5 DAYS Performed at [facility], [address] [place], [place]  [address]    Report Status 12/28/2021 FINAL  Final  Blood Culture (routine x 2)     Status: None   Collection Time: 12/23/21  1:40 AM   Specimen: BLOOD  Result Value Ref Range Status   Specimen Description BLOOD LEFT ANTECUBITAL  Final   Special Requests   Final    BOTTLES DRAWN AEROBIC AND ANAEROBIC Blood Culture results may not be optimal due to an excessive volume of blood received in culture bottles   Culture   Final    NO GROWTH 5 DAYS Performed at [facility], [address], [place], [place] [address]    Report Status 12/28/2021 FINAL  Final  Urine Culture     Status: Abnormal   Collection Time: 12/23/21  4:44 AM   Specimen: In/Out Cath Urine  Result Value Ref Range Status   Specimen Description   Final    IN/OUT CATH URINE Performed at [facility], [address], [place], [place] [address]    Special Requests   Final    NONE Performed at [facility], [address] [place], [place] [address]    Culture (A)  Final    100 COLONIES/mL STAPHYLOCOCCUS EPIDERMIDIS CALL MICROBIOLOGY LAB IF SENSITIVITIES ARE REQUIRED. Performed at [facility], [place] [address] [place], [place] [address]    Report Status 12/24/2021 FINAL  Final     Labs: BNP (last 3 results) Recent Labs    08/08/21 2102 12/23/21 0140  BNP 259.6* 599.3*   Basic Metabolic Panel: Recent Labs  Lab 12/23/21 0140 12/24/21 0526 12/25/21 0411 12/26/21 0833 12/27/21 0434 12/28/21 0458 12/28/21 1259  NA 142 136 136 139 136 136  --   K 5.3* 4.7 4.9 4.6 4.8 5.3* 4.9  CL 113* 105 105 107 107 106  --   CO2 23 22 21* _0 --   GLUCOSE 164* 134* 168* 181*  168* 103*  --   BUN 23 37* 48* 53* 60* 53*  --   CREATININE 1.25* 1.63* 1.50* 1.34* 1.51* 1.35*  --   CALCIUM 8.5* 8.2* 8.1* 8.8* 8.6* 8.8*  --   MG 1.9  --   --   --   --   --   --    Liver Function Tests: Recent Labs  Lab 12/23/21 0140  AST 28  ALT 25  ALKPHOS 73  BILITOT 0.7  PROT 6.6  ALBUMIN 3.6    No results for input(s): "LIPASE", "AMYLASE" in the last 168 hours. No results for input(s): "AMMONIA" in the last 168 hours. CBC: Recent Labs  Lab 12/23/21 0140 12/24/21 0526 12/25/21 0411 12/26/21 0408 12/28/21 0458  WBC 13.2* 12.2* 7.8 9.0 10.7*  NEUTROABS 11.9*  --   --   --   --   HGB 9.6* 8.0* 8.0* 8.5* 10.1*  HCT 33.3* 26.8* 26.0* 28.2* 33.1*  MCV 90.7 88.4 86.7 86.5 84.9  PLT 220 185 195 231 337   Cardiac Enzymes: No results for input(s): "CKTOTAL", "CKMB", "CKMBINDEX", "TROPONINI" in the last 168 hours. BNP: Invalid input(s): "POCBNP" CBG: No results for input(s): "GLUCAP" in the last 168 hours. D-Dimer No results for input(s): "DDIMER" in the last 72 hours. Hgb A1c No results for input(s): "HGBA1C" in the last 72 hours. Lipid Profile No results for input(s): "CHOL", "HDL", "LDLCALC", "TRIG", "CHOLHDL", "LDLDIRECT" in the last 72 hours. Thyroid function studies No results for input(s): "TSH", "T4TOTAL", "T3FREE", "THYROIDAB" in the last 72 hours.  Invalid input(s): "FREET3" Anemia work up No results for input(s): "VITAMINB12", "FOLATE", "FERRITIN", "TIBC", "IRON", "RETICCTPCT" in the last 72 hours. Urinalysis    Component Value Date/Time   COLORURINE STRAW (A) 12/23/2021 0444   APPEARANCEUR CLEAR (A) 12/23/2021 0444   APPEARANCEUR Clear 09/21/2021 1408   LABSPEC 1.012 12/23/2021 0444   PHURINE 6.0 12/23/2021 0444   GLUCOSEU NEGATIVE 12/23/2021 0444   HGBUR NEGATIVE 12/23/2021 0444   BILIRUBINUR NEGATIVE 12/23/2021 0444   BILIRUBINUR Negative 09/21/2021 1408   KETONESUR NEGATIVE 12/23/2021 0444   PROTEINUR NEGATIVE 12/23/2021 0444   NITRITE NEGATIVE 12/23/2021 0444   LEUKOCYTESUR NEGATIVE 12/23/2021 0444   Sepsis Labs Recent Labs  Lab 12/24/21 0526 12/25/21 0411 12/26/21 0408 12/28/21 0458  WBC 12.2* 7.8 9.0 10.7*   Microbiology Recent Results (from the past 240 hour(s))  Resp Panel by RT-PCR (Flu A&B, Covid) Anterior Nasal Swab     Status:  None   Collection Time: 12/23/21  1:40 AM   Specimen: Anterior Nasal Swab  Result Value Ref Range Status   SARS Coronavirus 2 by RT PCR NEGATIVE NEGATIVE Final    Comment: (NOTE) SARS-CoV-2 target nucleic acids are NOT DETECTED.  The SARS-CoV-2 RNA is generally detectable in upper respiratory specimens during the acute phase of infection. The lowest concentration of SARS-CoV-2 viral copies this assay can detect is 138 copies/mL. A negative result does not preclude SARS-Cov-2 infection and should not be used as the sole basis for treatment or other patient management decisions. A negative result may occur with  improper specimen collection/handling, submission of specimen other than nasopharyngeal swab, presence of viral mutation(s) within the areas targeted by this assay, and inadequate number of viral copies(<138 copies/mL). A negative result must be combined with clinical observations, patient history, and epidemiological information. The expected result is Negative.  Fact Sheet for Patients:  EntrepreneurPulse.com.au  Fact Sheet for Healthcare Providers:  IncredibleEmployment.be  This test is no t yet approved or  cleared by the [place] and  has been authorized for detection and/or diagnosis of SARS-CoV-2 by FDA under an Emergency Use Authorization (EUA). This EUA will remain  in effect (meaning this test can be used) for the duration of the COVID-19 declaration under Section 564(b)(1) of the Act, 21 U.S.C.section 360bbb-3(b)(1), unless the authorization is terminated  or revoked sooner.       Influenza A by PCR NEGATIVE NEGATIVE Final   Influenza B by PCR NEGATIVE NEGATIVE Final    Comment: (NOTE) The Xpert Xpress SARS-CoV-2/FLU/RSV plus assay is intended as an aid in the diagnosis of influenza from Nasopharyngeal swab specimens and should not be used as a sole basis for treatment. Nasal washings and aspirates are unacceptable  for Xpert Xpress SARS-CoV-2/FLU/RSV testing.  Fact Sheet for Patients: EntrepreneurPulse.com.au  Fact Sheet for Healthcare Providers: IncredibleEmployment.be  This test is not yet approved or cleared by the [place] FDA and has been authorized for detection and/or diagnosis of SARS-CoV-2 by FDA under an Emergency Use Authorization (EUA). This EUA will remain in effect (meaning this test can be used) for the duration of the COVID-19 declaration under Section 564(b)(1) of the Act, 21 U.S.C. section 360bbb-3(b)(1), unless the authorization is terminated or revoked.  Performed at [facility], [address] [place], [place] [address]   Blood Culture (routine x 2)     Status: None   Collection Time: 12/23/21  1:40 AM   Specimen: BLOOD  Result Value Ref Range Status   Specimen Description BLOOD RIGHT ANTECUBITAL  Final   Special Requests   Final    BOTTLES DRAWN AEROBIC AND ANAEROBIC Blood Culture results may not be optimal due to an excessive volume of blood received in culture bottles   Culture   Final    NO GROWTH 5 DAYS Performed at [facility], [address] [place], [place] [address]    Report Status 12/28/2021 FINAL  Final  Blood Culture (routine x 2)     Status: None   Collection Time: 12/23/21  1:40 AM   Specimen: BLOOD  Result Value Ref Range Status   Specimen Description BLOOD LEFT ANTECUBITAL  Final   Special Requests   Final    BOTTLES DRAWN AEROBIC AND ANAEROBIC Blood Culture results may not be optimal due to an excessive volume of blood received in culture bottles   Culture   Final    NO GROWTH 5 DAYS Performed at [facility], [address], [place], [place] [address]    Report Status 12/28/2021 FINAL  Final  Urine Culture     Status: Abnormal   Collection Time: 12/23/21  4:44 AM   Specimen: In/Out Cath Urine  Result Value Ref Range Status   Specimen Description   Final    IN/OUT  CATH URINE Performed at [facility], [address], [place], [place] [address]    Special Requests   Final    NONE Performed at [facility], [address] [place], [place] [address]    Culture (A)  Final    100 COLONIES/mL STAPHYLOCOCCUS EPIDERMIDIS CALL MICROBIOLOGY LAB IF SENSITIVITIES ARE REQUIRED. Performed at [facility], [address], [place], [place] [address]    Report Status 12/24/2021 FINAL  Final     Time coordinating discharge: Over 30 minutes  SIGNED:   Wyvonnia Dusky, MD  [facility] 12/28/2021, 2:49 PM Pager   If 7PM-7AM, please contact night-coverage [email or web address]

## 2022-01-03 DIAGNOSIS — C61 Malignant neoplasm of prostate: Secondary | ICD-10-CM | POA: Diagnosis not present

## 2022-01-03 DIAGNOSIS — D090 Carcinoma in situ of bladder: Secondary | ICD-10-CM | POA: Diagnosis not present

## 2022-01-03 DIAGNOSIS — H44001 Unspecified purulent endophthalmitis, right eye: Secondary | ICD-10-CM | POA: Diagnosis not present

## 2022-01-03 DIAGNOSIS — Z96641 Presence of right artificial hip joint: Secondary | ICD-10-CM | POA: Diagnosis not present

## 2022-01-10 NOTE — Progress Notes (Unsigned)
   Patient ID: Ruben Green, male    DOB: March 15, 1934, 88 y.o.   MRN: [id number]  HPI  Ruben Green is a 88 y/o male with a history of  Echo report from 12/24/21 reviewed and showed an EF of 50-55% along with mild LVH and trivial Ruben.   Admitted 12/23/21 due to acute respiratory distress. Placed on bipap. Initially given IV lasix with transition to oral diuretics. Cardiology and pulmonology consults obtained. Given IV antibiotics and nebulizers. Transitioned off bipap to oxygen at 3L but could not be weaned off of it. PT/OT evaluations done. Discharged after 5 days.   He presents today for his initial visit with a chief complaint of   Review of Systems    Physical Exam     Assessment & Plan:  1: Chronic heart failure with preserved ejection fraction with structural changes (LVH)- - NYHA class - BNP 12/23/21 was 415.5  2: HTN- - BP - saw PCP (Ruben Green) 10/23/21 - BMP 12/28/21 reviewed and showed sodium 136, potassium 4.9, creatinine 1.35 & GFR 51  3: COPD- - saw pulmonology Ruben Green) 08/28/21  4: Anxiety-

## 2022-01-11 ENCOUNTER — Ambulatory Visit: Payer: PPO | Attending: Family | Admitting: Family

## 2022-01-11 ENCOUNTER — Encounter: Payer: Self-pay | Admitting: Family

## 2022-01-11 VITALS — BP 137/51 | HR 62 | Resp 20 | Ht 62.0 in | Wt 162.2 lb

## 2022-01-11 DIAGNOSIS — Z8679 Personal history of other diseases of the circulatory system: Secondary | ICD-10-CM | POA: Insufficient documentation

## 2022-01-11 DIAGNOSIS — I5032 Chronic diastolic (congestive) heart failure: Secondary | ICD-10-CM

## 2022-01-11 DIAGNOSIS — F419 Anxiety disorder, unspecified: Secondary | ICD-10-CM | POA: Diagnosis not present

## 2022-01-11 DIAGNOSIS — D649 Anemia, unspecified: Secondary | ICD-10-CM | POA: Insufficient documentation

## 2022-01-11 DIAGNOSIS — I2510 Atherosclerotic heart disease of native coronary artery without angina pectoris: Secondary | ICD-10-CM | POA: Diagnosis not present

## 2022-01-11 DIAGNOSIS — J449 Chronic obstructive pulmonary disease, unspecified: Secondary | ICD-10-CM

## 2022-01-11 DIAGNOSIS — Z87891 Personal history of nicotine dependence: Secondary | ICD-10-CM | POA: Diagnosis not present

## 2022-01-11 DIAGNOSIS — E785 Hyperlipidemia, unspecified: Secondary | ICD-10-CM | POA: Diagnosis not present

## 2022-01-11 DIAGNOSIS — Z8546 Personal history of malignant neoplasm of prostate: Secondary | ICD-10-CM | POA: Insufficient documentation

## 2022-01-11 DIAGNOSIS — I110 Hypertensive heart disease with heart failure: Secondary | ICD-10-CM | POA: Insufficient documentation

## 2022-01-11 DIAGNOSIS — I10 Essential (primary) hypertension: Secondary | ICD-10-CM | POA: Diagnosis not present

## 2022-01-11 NOTE — Patient Instructions (Signed)
Continue weighing daily and call for an overnight weight gain of 3 pounds or more or a weekly weight gain of more than 5 pounds.   If you have voicemail, please make sure your mailbox is cleaned out so that we may leave a message and please make sure to listen to any voicemails.     

## 2022-01-16 DIAGNOSIS — I10 Essential (primary) hypertension: Secondary | ICD-10-CM | POA: Diagnosis not present

## 2022-01-16 DIAGNOSIS — E782 Mixed hyperlipidemia: Secondary | ICD-10-CM | POA: Diagnosis not present

## 2022-01-16 DIAGNOSIS — R531 Weakness: Secondary | ICD-10-CM | POA: Diagnosis not present

## 2022-01-16 DIAGNOSIS — R7309 Other abnormal glucose: Secondary | ICD-10-CM | POA: Diagnosis not present

## 2022-01-16 DIAGNOSIS — D508 Other iron deficiency anemias: Secondary | ICD-10-CM | POA: Diagnosis not present

## 2022-01-17 DIAGNOSIS — R9431 Abnormal electrocardiogram [ECG] [EKG]: Secondary | ICD-10-CM | POA: Diagnosis not present

## 2022-01-17 DIAGNOSIS — I714 Abdominal aortic aneurysm, without rupture, unspecified: Secondary | ICD-10-CM | POA: Diagnosis not present

## 2022-01-17 DIAGNOSIS — I2510 Atherosclerotic heart disease of native coronary artery without angina pectoris: Secondary | ICD-10-CM | POA: Diagnosis not present

## 2022-01-17 DIAGNOSIS — I214 Non-ST elevation (NSTEMI) myocardial infarction: Secondary | ICD-10-CM | POA: Diagnosis not present

## 2022-01-17 DIAGNOSIS — E782 Mixed hyperlipidemia: Secondary | ICD-10-CM | POA: Diagnosis not present

## 2022-01-17 DIAGNOSIS — I10 Essential (primary) hypertension: Secondary | ICD-10-CM | POA: Diagnosis not present

## 2022-01-22 DIAGNOSIS — H353212 Exudative age-related macular degeneration, right eye, with inactive choroidal neovascularization: Secondary | ICD-10-CM | POA: Diagnosis not present

## 2022-01-23 DIAGNOSIS — I700 Atherosclerosis of aorta: Secondary | ICD-10-CM | POA: Diagnosis not present

## 2022-01-23 DIAGNOSIS — J449 Chronic obstructive pulmonary disease, unspecified: Secondary | ICD-10-CM | POA: Diagnosis not present

## 2022-01-23 DIAGNOSIS — I10 Essential (primary) hypertension: Secondary | ICD-10-CM | POA: Diagnosis not present

## 2022-01-23 DIAGNOSIS — F411 Generalized anxiety disorder: Secondary | ICD-10-CM | POA: Diagnosis not present

## 2022-01-23 DIAGNOSIS — D649 Anemia, unspecified: Secondary | ICD-10-CM | POA: Diagnosis not present

## 2022-01-23 DIAGNOSIS — J189 Pneumonia, unspecified organism: Secondary | ICD-10-CM | POA: Diagnosis not present

## 2022-01-29 DIAGNOSIS — Z96641 Presence of right artificial hip joint: Secondary | ICD-10-CM | POA: Diagnosis not present

## 2022-01-29 DIAGNOSIS — M1611 Unilateral primary osteoarthritis, right hip: Secondary | ICD-10-CM | POA: Diagnosis not present

## 2022-01-29 DIAGNOSIS — M1711 Unilateral primary osteoarthritis, right knee: Secondary | ICD-10-CM | POA: Diagnosis not present

## 2022-02-03 DIAGNOSIS — Z96641 Presence of right artificial hip joint: Secondary | ICD-10-CM | POA: Diagnosis not present

## 2022-02-03 DIAGNOSIS — H44001 Unspecified purulent endophthalmitis, right eye: Secondary | ICD-10-CM | POA: Diagnosis not present

## 2022-02-03 DIAGNOSIS — D090 Carcinoma in situ of bladder: Secondary | ICD-10-CM | POA: Diagnosis not present

## 2022-02-03 DIAGNOSIS — C61 Malignant neoplasm of prostate: Secondary | ICD-10-CM | POA: Diagnosis not present

## 2022-02-08 ENCOUNTER — Inpatient Hospital Stay: Payer: PPO

## 2022-02-08 ENCOUNTER — Inpatient Hospital Stay: Payer: PPO | Attending: Internal Medicine | Admitting: Oncology

## 2022-02-08 ENCOUNTER — Encounter: Payer: Self-pay | Admitting: Oncology

## 2022-02-08 VITALS — BP 136/46 | HR 60 | Temp 98.7°F | Resp 19 | Wt 164.1 lb

## 2022-02-08 DIAGNOSIS — J449 Chronic obstructive pulmonary disease, unspecified: Secondary | ICD-10-CM | POA: Diagnosis not present

## 2022-02-08 DIAGNOSIS — C61 Malignant neoplasm of prostate: Secondary | ICD-10-CM

## 2022-02-08 DIAGNOSIS — D500 Iron deficiency anemia secondary to blood loss (chronic): Secondary | ICD-10-CM | POA: Insufficient documentation

## 2022-02-08 DIAGNOSIS — I130 Hypertensive heart and chronic kidney disease with heart failure and stage 1 through stage 4 chronic kidney disease, or unspecified chronic kidney disease: Secondary | ICD-10-CM | POA: Diagnosis not present

## 2022-02-08 DIAGNOSIS — N189 Chronic kidney disease, unspecified: Secondary | ICD-10-CM

## 2022-02-08 DIAGNOSIS — D631 Anemia in chronic kidney disease: Secondary | ICD-10-CM | POA: Diagnosis not present

## 2022-02-08 DIAGNOSIS — I129 Hypertensive chronic kidney disease with stage 1 through stage 4 chronic kidney disease, or unspecified chronic kidney disease: Secondary | ICD-10-CM

## 2022-02-08 LAB — IRON AND TIBC
Iron: 71 ug/dL (ref 45–182)
Saturation Ratios: 16 % — ABNORMAL LOW (ref 17.9–39.5)
TIBC: 434 ug/dL (ref 250–450)
UIBC: 363 ug/dL

## 2022-02-08 LAB — CBC WITH DIFFERENTIAL/PLATELET
Abs Immature Granulocytes: 0.06 10*3/uL (ref 0.00–0.07)
Basophils Absolute: 0 10*3/uL (ref 0.0–0.1)
Basophils Relative: 0 %
Eosinophils Absolute: 0 10*3/uL (ref 0.0–0.5)
Eosinophils Relative: 0 %
HCT: 34.5 % — ABNORMAL LOW (ref 39.0–52.0)
Hemoglobin: 10.3 g/dL — ABNORMAL LOW (ref 13.0–17.0)
Immature Granulocytes: 1 %
Lymphocytes Relative: 7 %
Lymphs Abs: 0.8 10*3/uL (ref 0.7–4.0)
MCH: 27.2 pg (ref 26.0–34.0)
MCHC: 29.9 g/dL — ABNORMAL LOW (ref 30.0–36.0)
MCV: 91 fL (ref 80.0–100.0)
Monocytes Absolute: 1 10*3/uL (ref 0.1–1.0)
Monocytes Relative: 9 %
Neutro Abs: 8.9 10*3/uL — ABNORMAL HIGH (ref 1.7–7.7)
Neutrophils Relative %: 83 %
Platelets: 220 10*3/uL (ref 150–400)
RBC: 3.79 MIL/uL — ABNORMAL LOW (ref 4.22–5.81)
RDW: 19.5 % — ABNORMAL HIGH (ref 11.5–15.5)
WBC: 10.7 10*3/uL — ABNORMAL HIGH (ref 4.0–10.5)
nRBC: 0 % (ref 0.0–0.2)

## 2022-02-08 LAB — RETIC PANEL
Immature Retic Fract: 10.5 % (ref 2.3–15.9)
RBC.: 3.76 MIL/uL — ABNORMAL LOW (ref 4.22–5.81)
Retic Count, Absolute: 63.2 10*3/uL (ref 19.0–186.0)
Retic Ct Pct: 1.7 % (ref 0.4–3.1)
Reticulocyte Hemoglobin: 33.5 pg (ref >27.9)

## 2022-02-08 LAB — FERRITIN: Ferritin: 21 ng/mL — ABNORMAL LOW (ref 24–336)

## 2022-02-09 ENCOUNTER — Telehealth: Payer: Self-pay

## 2022-02-09 ENCOUNTER — Encounter: Payer: Self-pay | Admitting: Oncology

## 2022-02-09 DIAGNOSIS — N189 Chronic kidney disease, unspecified: Secondary | ICD-10-CM | POA: Insufficient documentation

## 2022-02-09 DIAGNOSIS — D500 Iron deficiency anemia secondary to blood loss (chronic): Secondary | ICD-10-CM

## 2022-02-09 DIAGNOSIS — D631 Anemia in chronic kidney disease: Secondary | ICD-10-CM | POA: Insufficient documentation

## 2022-02-09 LAB — KAPPA/LAMBDA LIGHT CHAINS
Kappa free light chain: 40.9 mg/L — ABNORMAL HIGH (ref 3.3–19.4)
Kappa, lambda light chain ratio: 1.21 (ref 0.26–1.65)
Lambda free light chains: 33.7 mg/L — ABNORMAL HIGH (ref 5.7–26.3)

## 2022-02-09 NOTE — Assessment & Plan Note (Signed)
Check CBC, iron TIBC ferritin, Patient has been on oral iron supplementation. I discussed about IV Venofer options. I discussed about the potential risks including but not limited to allergic reactions/infusion reactions including anaphylactic reactions, phlebitis, high blood pressure, wheezing, SOB, skin rash, weight gain, leg swelling, headache, nausea and fatigue, etc. Patient agrees with IV Venofer as he desires to achieved higher level of iron faster for adequate hematopoesis. Today's lab results were available of the patient's encounter. Hemoglobin 10.3, iron panel shows saturation of 16, ferritin of 21.  Consistent with iron deficiency. Plan IV venofer weekly x 5 Etiology of iron deficiency is likely due to chronic blood loss.  Possible proctitis.  Encourage GI evaluation.  Patient declines.

## 2022-02-09 NOTE — Telephone Encounter (Signed)
-----   Message from Earlie Server, MD sent at 02/09/2022  8:37 AM EDT ----- Patient has iron deficiency.  Recommend IV Venofer weekly x5.  Please arrange. Follow-up Lab prior to MD plus Venofer in 3 months.

## 2022-02-09 NOTE — Telephone Encounter (Signed)
Pt's wife [name] informed of follow up plan.  Please schedule patient and contact Lucy with appts  IV venofer weekly x5 *first dose is NEW* Labs in 3 months  MD/ venofer 1-2 days after labs  Pt requesting appts after 3pm.

## 2022-02-09 NOTE — Assessment & Plan Note (Signed)
Previously treated with radiation.   Recommend patient to continue follow-up with urology and radiation oncology.

## 2022-02-09 NOTE — Progress Notes (Signed)
Hematology/Oncology Consult note [phone] [phone]      Patient Care Team: Tracie Harrier, MD as PCP - General (Internal Medicine) Noreene Filbert, MD as Referring Physician (Radiation Oncology)   REFERRING PROVIDER: Tracie Harrier, MD  CHIEF COMPLAINTS/REASON FOR VISIT:  Anemia  ASSESSMENT & PLAN:  Iron deficiency anemia due to chronic blood loss Check CBC, iron TIBC ferritin, Patient has been on oral iron supplementation. I discussed about IV Venofer options. I discussed about the potential risks including but not limited to allergic reactions/infusion reactions including anaphylactic reactions, phlebitis, high blood pressure, wheezing, SOB, skin rash, weight gain, leg swelling, headache, nausea and fatigue, etc. Patient agrees with IV Venofer as he desires to achieved higher level of iron faster for adequate hematopoesis. Today's lab results were available of the patient's encounter. Hemoglobin 10.3, iron panel shows saturation of 16, ferritin of 21.  Consistent with iron deficiency. Plan IV venofer weekly x 5 Etiology of iron deficiency is likely due to chronic blood loss.  Possible proctitis.  Encourage GI evaluation.  Patient declines.     Anemia due to chronic kidney disease Check multiple myeloma panel, light chain ratio.  Prostate cancer [facility]) Previously treated with radiation.   Recommend patient to continue follow-up with urology and radiation oncology.  Orders Placed This Encounter  Procedures   Ferritin    Standing Status:   Future    Number of Occurrences:   1    Standing Expiration Date:   08/11/2022   Iron and TIBC    Standing Status:   Future    Number of Occurrences:   1    Standing Expiration Date:   02/09/2023   CBC with Differential/Platelet    Standing Status:   Future    Number of Occurrences:   1    Standing Expiration Date:   02/09/2023   Multiple Myeloma Panel (SPEP&IFE w/QIG)    Standing Status:   Future     Number of Occurrences:   1    Standing Expiration Date:   02/09/2023   Kappa/lambda light chains    Standing Status:   Future    Number of Occurrences:   1    Standing Expiration Date:   02/09/2023   Retic Panel    Standing Status:   Future    Number of Occurrences:   1    Standing Expiration Date:   02/09/2023   Follow-up IV Venofer weekly x5 Follow-up in 3 months All questions were answered. The patient knows to call the clinic with any problems, questions or concerns.  Earlie Server, MD, PhD [facility] Hematology Oncology 02/08/2022     HISTORY OF PRESENTING ILLNESS:  Ruben Green is a  88 y.o.  male with PMH listed below who was referred to me for anemia Reviewed patient's recent labs that was done.  He was found to have abnormal CBC on 01/16/2022, hemoglobin 8.8, MCV 88.3, Reviewed patient's previous labs ordered by primary care physician's office, anemia is chronic onset, gradually worsening.  Patient has chronic kidney disease. He denies recent chest pain on exertion,  pre-syncopal episodes, or palpitations He had not noticed any recent bleeding such as epistaxis, hematuria  He denies over the counter NSAID ingestion. His last colonoscopy was  He denies any pica and eats a variety of diet. Chronic shortness of breath with exertion due to COPD. Patient was accompanied by wife.   Patient has history of prostate cancer that was treated with radiation in 2017 by Dr. Baruch Gouty.  Patient has had proctitis and intermittently he has blood in the stool. Patient speaks [place].  He had a hard hearing.  History was obtained primarily from wife.  Patient declines [place] interpreter service MEDICAL HISTORY:  Past Medical History:  Diagnosis Date   AAA (abdominal aortic aneurysm) ([place])    a.) s/p repair in 2005   Anemia    Anginal pain ([place])    Anxiety    Arthritis    B12 deficiency    Bilateral cataracts    a.) s/p BILATERAL extractions in 2018   BPH with obstruction/lower urinary  tract symptoms    CAD (coronary artery disease)    Carotid artery stenosis    a.) s/p CEA on the RIGHT   CHF (congestive heart failure) ([facility])    COPD (chronic obstructive pulmonary disease) ([place])    Diastolic dysfunction 62/95/2841   a.)  TTE 07/29/2019: EF 50-55%; LA mildly enlarged; G1DD.   DOE (dyspnea on exertion)    Elevated PSA    Environmental and seasonal allergies    History of 2019 novel coronavirus disease (COVID-19)    History of kidney stones    HLD (hyperlipidemia)    HOH (hard of hearing)    HTN (hypertension)    Incomplete bladder emptying    Macular degeneration    Prostate cancer (H[facility])    RBBB (right bundle branch block)    Valvular insufficiency    a.) TTE 07/29/2019: LVEF 50-55%; LA mild enlarged; triv AR/PR, mild MR, mod TR.    SURGICAL HISTORY: Past Surgical History:  Procedure Laterality Date   ABDOMINAL AORTIC ANEURYSM REPAIR     CATARACT EXTRACTION W/PHACO Right 09/25/2016   Procedure: CATARACT EXTRACTION PHACO AND INTRAOCULAR LENS PLACEMENT (IOC);  Surgeon: Birder Robson, MD;  Location: [facility] ORS;  Service: Ophthalmology;  Laterality: Right;  [place] 01:01 AP% 17.4 CDE 10.75 Fluid pack lot # [id number]   CATARACT EXTRACTION W/PHACO Left 10/16/2016   Procedure: CATARACT EXTRACTION PHACO AND INTRAOCULAR LENS PLACEMENT ([place]) Suture placed in Left eye;  Surgeon: Birder Robson, MD;  Location: [facility] ORS;  Service: Ophthalmology;  Laterality: Left;  [place] 2:06.8 AP% 22.2 CDE 28.17 Fluid pack lot # [id number]   COLONOSCOPY WITH PROPOFOL N/A 03/31/2019   Procedure: COLONOSCOPY WITH PROPOFOL;  Surgeon: Lollie Sails, MD;  Location[facility] ENDOSCOPY;  Service: Endoscopy;  Laterality: N/A;   COLONOSCOPY WITH PROPOFOL N/A 09/09/2020   Procedure: COLONOSCOPY WITH PROPOFOL;  Surgeon: Lesly Rubenstein, MD;  Location: [facility] ENDOSCOPY;  Service: Endoscopy;  Laterality: N/A;   CYSTOSCOPY W/ RETROGRADES Bilateral 02/23/2020   Procedure: CYSTOSCOPY WITH RETROGRADE PYELOGRAM;   Surgeon: Abbie Sons, MD;  Location: [facility] ORS;  Service: Urology;  Laterality: Bilateral;   CYSTOSCOPY WITH BIOPSY N/A 02/23/2020   Procedure: CYSTOSCOPY WITH BIOPSY;  Surgeon: Abbie Sons, MD;  Location: [facility] ORS;  Service: Urology;  Laterality: N/A;   EYE SURGERY     HERNIA REPAIR     x 2   INGUINAL HERNIA REPAIR Right 12/02/2015   Procedure: HERNIA REPAIR INGUINAL ADULT;  Surgeon: Leonie Green, MD;  Location: [facility] ORS;  Service: General;  Laterality: Right;   PROSTATE SURGERY  2012   Right Carotid Endarterectomy     TOTAL HIP ARTHROPLASTY Right 08/01/2021   Procedure: TOTAL HIP ARTHROPLASTY;  Surgeon: Corky Mull, MD;  Location: [facility] ORS;  Service: Orthopedics;  Laterality: Right;    SOCIAL HISTORY: Social History   Socioeconomic History   Marital status: Married    Spouse name:  Not on file   Number of children: Not on file   Years of education: Not on file   Highest education level: Not on file  Occupational History   Not on file  Tobacco Use   Smoking status: Former    Types: Cigarettes    Quit date: 11/30/1998    Years since quitting: 23.2   Smokeless tobacco: Former   Tobacco comments:    quit 10 years ago  Vaping Use   Vaping Use: Never used  Substance and Sexual Activity   Alcohol use: Not Currently    Comment: occasional   Drug use: No   Sexual activity: Not on file  Other Topics Concern   Not on file  Social History Narrative   Not on file   Social Determinants of Health   Financial Resource Strain: Not on file  Food Insecurity: Not on file  Transportation Needs: Not on file  Physical Activity: Not on file  Stress: Not on file  Social Connections: Not on file  Intimate Partner Violence: Not on file    FAMILY HISTORY: Family History  Problem Relation Age of Onset   Cancer Brother        2000   Prostate cancer Neg Hx    Bladder Cancer Neg Hx    Kidney cancer Neg Hx     ALLERGIES:  is allergic to oxycodone.  MEDICATIONS:   Current Outpatient Medications  Medication Sig Dispense Refill   acetaminophen (TYLENOL) 500 MG tablet Take 1-2 tablets (500-1,000 mg total) by mouth every 6 (six) hours as needed for mild pain. 60 tablet 0   amLODipine (NORVASC) 10 MG tablet Take 1 tablet (10 mg total) by mouth daily. 30 tablet 0   aspirin EC 81 MG tablet Take 1 tablet (81 mg total) by mouth daily. Swallow whole. 30 tablet    atorvastatin (LIPITOR) 40 MG tablet Take 40 mg by mouth daily.     Fluticasone-Umeclidin-Vilant (TRELEGY ELLIPTA) 100-62.5-25 MCG/INH AEPB Inhale 2 puffs into the lungs daily.     iron polysaccharides (NIFEREX) 150 MG capsule Take 150 mg by mouth 2 (two) times daily.     metoprolol succinate (TOPROL-XL) 50 MG 24 hr tablet Take 1 tablet (50 mg total) by mouth daily. Take with or immediately following a meal. 30 tablet 0   tamsulosin (FLOMAX) 0.4 MG CAPS capsule Take 1 capsule (0.4 mg total) by mouth daily. 30 capsule 0   citalopram (CELEXA) 10 MG tablet Take 1 tablet (10 mg total) by mouth daily. (Patient not taking: Reported on 01/11/2022) 30 tablet 0   LORazepam (ATIVAN) 0.5 MG tablet Take 0.5 mg by mouth daily as needed for anxiety. (Patient not taking: Reported on 01/11/2022)     No current facility-administered medications for this visit.    Review of Systems  Constitutional:  Positive for fatigue. Negative for chills and fever.  HENT:   Positive for hearing loss. Negative for voice change.   Eyes:  Negative for eye problems and icterus.  Respiratory:  Positive for shortness of breath. Negative for chest tightness and cough.   Cardiovascular:  Negative for chest pain and leg swelling.  Gastrointestinal:  Negative for abdominal distention and abdominal pain.  Endocrine: Negative for hot flashes.  Genitourinary:  Negative for difficulty urinating, dysuria and frequency.   Musculoskeletal:  Negative for arthralgias.  Skin:  Negative for itching and rash.  Neurological:  Negative for light-headedness  and numbness.  Hematological:  Negative for adenopathy. Does not bruise/bleed easily.  Psychiatric/Behavioral:  Negative for confusion.     PHYSICAL EXAMINATION: ECOG PERFORMANCE STATUS: 1 - Symptomatic but completely ambulatory Vitals:   02/08/22 0947  BP: (!) 136/46  Pulse: 60  Resp: 19  Temp: 98.7 F (37.1 C)  SpO2: 98%   Filed Weights   02/08/22 0947  Weight: 164 lb 1.6 oz (74.4 kg)    Physical Exam Constitutional:      General: He is not in acute distress. HENT:     Head: Normocephalic and atraumatic.  Eyes:     General: No scleral icterus. Cardiovascular:     Rate and Rhythm: Normal rate and regular rhythm.     Heart sounds: Normal heart sounds.  Pulmonary:     Effort: Pulmonary effort is normal. No respiratory distress.     Breath sounds: No wheezing.     Comments: Significantly decreased breath sound bilaterally Abdominal:     General: Bowel sounds are normal. There is no distension.     Palpations: Abdomen is soft.  Musculoskeletal:        General: No deformity. Normal range of motion.     Cervical back: Normal range of motion and neck supple.  Skin:    General: Skin is warm and dry.     Findings: No erythema or rash.  Neurological:     Mental Status: He is alert and oriented to person, place, and time. Mental status is at baseline.     Cranial Nerves: No cranial nerve deficit.     Coordination: Coordination normal.  Psychiatric:        Mood and Affect: Mood normal.      LABORATORY DATA:  I have reviewed the data as listed    Latest Ref Rng & Units 02/08/2022   10:21 AM 12/28/2021    4:58 AM 12/26/2021    4:08 AM  CBC  WBC 4.0 - 10.5 K/uL 10.7  10.7  9.0   Hemoglobin 13.0 - 17.0 g/dL 10.3  10.1  8.5   Hematocrit 39.0 - 52.0 % 34.5  33.1  28.2   Platelets 150 - 400 K/uL 220  337  231       Latest Ref Rng & Units 12/28/2021   12:59 PM 12/28/2021    4:58 AM 12/27/2021    4:34 AM  CMP  Glucose 70 - 99 mg/dL  103  168   BUN 8 - 23 mg/dL  53  60    Creatinine 0.61 - 1.24 mg/dL  1.35  1.51   Sodium 135 - 145 mmol/L  136  136   Potassium 3.5 - 5.1 mmol/L 4.9  5.3  4.8   Chloride 98 - 111 mmol/L  106  107   CO2 22 - 32 mmol/L  24  22   Calcium 8.9 - 10.3 mg/dL  8.8  8.6       Component Value Date/Time   IRON 71 02/08/2022 1021   TIBC 434 02/08/2022 1021   FERRITIN 21 (L) 02/08/2022 1021   IRONPCTSAT 16 (L) 02/08/2022 1021     RADIOGRAPHIC STUDIES: I have personally reviewed the radiological images as listed and agreed with the findings in the report. No results found.

## 2022-02-09 NOTE — Addendum Note (Signed)
Addended by: Earlie Server on: 02/09/2022 08:38 AM   Modules accepted: Orders

## 2022-02-09 NOTE — Assessment & Plan Note (Signed)
Check multiple myeloma panel, light chain ratio. 

## 2022-02-12 ENCOUNTER — Other Ambulatory Visit: Payer: PPO

## 2022-02-12 ENCOUNTER — Encounter: Payer: PPO | Admitting: Internal Medicine

## 2022-02-12 LAB — MULTIPLE MYELOMA PANEL, SERUM
Albumin SerPl Elph-Mcnc: 3.6 g/dL (ref 2.9–4.4)
Albumin/Glob SerPl: 1.4 (ref 0.7–1.7)
Alpha 1: 0.2 g/dL (ref 0.0–0.4)
Alpha2 Glob SerPl Elph-Mcnc: 0.7 g/dL (ref 0.4–1.0)
B-Globulin SerPl Elph-Mcnc: 1 g/dL (ref 0.7–1.3)
Gamma Glob SerPl Elph-Mcnc: 0.8 g/dL (ref 0.4–1.8)
Globulin, Total: 2.7 g/dL (ref 2.2–3.9)
IgA: 174 mg/dL (ref 61–437)
IgG (Immunoglobin G), Serum: 733 mg/dL (ref 603–1613)
IgM (Immunoglobulin M), Srm: 134 mg/dL (ref 15–143)
Total Protein ELP: 6.3 g/dL (ref 6.0–8.5)

## 2022-02-16 ENCOUNTER — Inpatient Hospital Stay: Payer: PPO | Attending: Internal Medicine

## 2022-02-16 VITALS — BP 131/58 | HR 67 | Temp 98.0°F | Resp 18

## 2022-02-16 DIAGNOSIS — D500 Iron deficiency anemia secondary to blood loss (chronic): Secondary | ICD-10-CM

## 2022-02-16 DIAGNOSIS — D509 Iron deficiency anemia, unspecified: Secondary | ICD-10-CM | POA: Diagnosis not present

## 2022-02-16 DIAGNOSIS — N189 Chronic kidney disease, unspecified: Secondary | ICD-10-CM | POA: Insufficient documentation

## 2022-02-16 MED ORDER — SODIUM CHLORIDE 0.9 % IV SOLN
Freq: Once | INTRAVENOUS | Status: AC
  Filled 2022-02-16: qty 250

## 2022-02-16 MED ORDER — SODIUM CHLORIDE 0.9 % IV SOLN
200.0000 mg | Freq: Once | INTRAVENOUS | Status: AC
  Administered 2022-02-16: 200 mg via INTRAVENOUS
  Filled 2022-02-16: qty 200

## 2022-02-16 NOTE — Progress Notes (Signed)
1st time venofer given per protocol. Patient monitored for 30 minutes post infusion. Tolerated well. VSS. Discharged in stable condition.

## 2022-02-21 DIAGNOSIS — I110 Hypertensive heart disease with heart failure: Secondary | ICD-10-CM | POA: Diagnosis not present

## 2022-02-21 DIAGNOSIS — E785 Hyperlipidemia, unspecified: Secondary | ICD-10-CM | POA: Diagnosis not present

## 2022-02-21 DIAGNOSIS — Z8546 Personal history of malignant neoplasm of prostate: Secondary | ICD-10-CM | POA: Diagnosis not present

## 2022-02-21 DIAGNOSIS — Z8616 Personal history of COVID-19: Secondary | ICD-10-CM | POA: Diagnosis not present

## 2022-02-21 DIAGNOSIS — Z9981 Dependence on supplemental oxygen: Secondary | ICD-10-CM | POA: Diagnosis not present

## 2022-02-21 DIAGNOSIS — I509 Heart failure, unspecified: Secondary | ICD-10-CM | POA: Diagnosis not present

## 2022-02-21 DIAGNOSIS — N401 Enlarged prostate with lower urinary tract symptoms: Secondary | ICD-10-CM | POA: Diagnosis not present

## 2022-02-21 DIAGNOSIS — I2510 Atherosclerotic heart disease of native coronary artery without angina pectoris: Secondary | ICD-10-CM | POA: Diagnosis not present

## 2022-02-21 DIAGNOSIS — Z87891 Personal history of nicotine dependence: Secondary | ICD-10-CM | POA: Diagnosis not present

## 2022-02-21 DIAGNOSIS — J449 Chronic obstructive pulmonary disease, unspecified: Secondary | ICD-10-CM | POA: Diagnosis not present

## 2022-02-22 ENCOUNTER — Encounter: Payer: Self-pay | Admitting: Urology

## 2022-02-22 ENCOUNTER — Ambulatory Visit: Payer: PPO | Admitting: Urology

## 2022-02-22 VITALS — BP 119/69 | HR 76 | Ht 68.0 in | Wt 165.0 lb

## 2022-02-22 DIAGNOSIS — Z8551 Personal history of malignant neoplasm of bladder: Secondary | ICD-10-CM

## 2022-02-22 DIAGNOSIS — R310 Gross hematuria: Secondary | ICD-10-CM | POA: Diagnosis not present

## 2022-02-22 LAB — URINALYSIS, COMPLETE
Bilirubin, UA: NEGATIVE
Glucose, UA: NEGATIVE
Ketones, UA: NEGATIVE
Leukocytes,UA: NEGATIVE
Nitrite, UA: NEGATIVE
Protein,UA: NEGATIVE
RBC, UA: NEGATIVE
Specific Gravity, UA: 1.015 (ref 1.005–1.030)
Urobilinogen, Ur: 0.2 mg/dL (ref 0.2–1.0)
pH, UA: 5 (ref 5.0–7.5)

## 2022-02-22 LAB — MICROSCOPIC EXAMINATION
Bacteria, UA: NONE SEEN
RBC, Urine: NONE SEEN /hpf (ref 0–2)

## 2022-02-22 NOTE — Progress Notes (Signed)
   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 NED. A&Ox3.   No respiratory distress   Abd soft, NT, ND Normal phallus with bilateral descended testicles  Cystoscopy Procedure Note  Patient identification was confirmed, informed consent was obtained, and patient was prepped using Betadine solution.  Lidocaine jelly was administered per urethral meatus.     Pre-Procedure: - Inspection reveals a normal caliber urethral meatus.  Procedure: The flexible cystoscope was introduced without difficulty - No urethral strictures/lesions are present. -  Moderate lateral lobe enlargement  prostate  - Elevated bladder neck, moderate - Bilateral ureteral orifices identified - Bladder mucosa  reveals no ulcers, tumors, or lesions - No bladder stones -Moderate trabeculation with cellules/diverticula   Retroflexion shows small intravesical prostatic tissue with hypervascularity  Post-Procedure: - Patient tolerated the procedure well  Assessment/ Plan: No bladder mucosal abnormalities/recurrent tumor identified Tumor free x2 years and will move to every 6 month surveillance cystoscopy    Abbie Sons, MD

## 2022-02-23 ENCOUNTER — Inpatient Hospital Stay: Payer: PPO

## 2022-02-23 VITALS — BP 123/66 | HR 71 | Temp 97.7°F | Resp 20

## 2022-02-23 DIAGNOSIS — D500 Iron deficiency anemia secondary to blood loss (chronic): Secondary | ICD-10-CM

## 2022-02-23 DIAGNOSIS — D509 Iron deficiency anemia, unspecified: Secondary | ICD-10-CM | POA: Diagnosis not present

## 2022-02-23 MED ORDER — SODIUM CHLORIDE 0.9 % IV SOLN
200.0000 mg | Freq: Once | INTRAVENOUS | Status: AC
  Administered 2022-02-23: 200 mg via INTRAVENOUS
  Filled 2022-02-23: qty 200

## 2022-02-23 MED ORDER — SODIUM CHLORIDE 0.9 % IV SOLN
Freq: Once | INTRAVENOUS | Status: AC
  Filled 2022-02-23: qty 250

## 2022-02-27 ENCOUNTER — Ambulatory Visit: Payer: PPO | Admitting: Family

## 2022-02-28 ENCOUNTER — Ambulatory Visit: Payer: PPO | Admitting: Family

## 2022-03-02 ENCOUNTER — Inpatient Hospital Stay: Payer: PPO

## 2022-03-02 VITALS — BP 118/52 | HR 66 | Temp 97.5°F | Resp 17

## 2022-03-02 DIAGNOSIS — D500 Iron deficiency anemia secondary to blood loss (chronic): Secondary | ICD-10-CM

## 2022-03-02 DIAGNOSIS — D509 Iron deficiency anemia, unspecified: Secondary | ICD-10-CM | POA: Diagnosis not present

## 2022-03-02 MED ORDER — SODIUM CHLORIDE 0.9 % IV SOLN
200.0000 mg | Freq: Once | INTRAVENOUS | Status: AC
  Administered 2022-03-02: 200 mg via INTRAVENOUS
  Filled 2022-03-02: qty 200

## 2022-03-02 MED ORDER — SODIUM CHLORIDE 0.9 % IV SOLN
Freq: Once | INTRAVENOUS | Status: AC
  Filled 2022-03-02: qty 250

## 2022-03-06 DIAGNOSIS — C61 Malignant neoplasm of prostate: Secondary | ICD-10-CM | POA: Diagnosis not present

## 2022-03-06 DIAGNOSIS — D090 Carcinoma in situ of bladder: Secondary | ICD-10-CM | POA: Diagnosis not present

## 2022-03-06 DIAGNOSIS — H44001 Unspecified purulent endophthalmitis, right eye: Secondary | ICD-10-CM | POA: Diagnosis not present

## 2022-03-06 DIAGNOSIS — Z96641 Presence of right artificial hip joint: Secondary | ICD-10-CM | POA: Diagnosis not present

## 2022-03-06 NOTE — Progress Notes (Unsigned)
Patient ID: Ruben Green, male    DOB: Nov 04, 1933, 88 y.o.   MRN: [id number]  HPI  Ruben Green is a 88 y/o male with a history of AAA, carotid disease, CAD, hyperlipidemia, HTN, anemia, anxiety, BPD, COPD, prostate cancer, valvular insufficiency, previous tobacco use and chronic heart failure.   Echo report from 12/24/21 reviewed and showed an EF of 50-55% along with mild LVH and trivial Ruben.   Admitted 12/23/21 due to acute respiratory distress. Placed on bipap. Initially given IV lasix with transition to oral diuretics. Cardiology and pulmonology consults obtained. Given IV antibiotics and nebulizers. Transitioned off bipap to oxygen at 3L but could not be weaned off of it. PT/OT evaluations done. Discharged after 5 days.   He presents today for a follow-up visit with a chief complaint of minimal fatigue upon moderate exertion. Describes this as chronic in nature. He has associated bruising and anxiety along with this.He denies any difficulty sleeping, dizziness, abdominal distention, palpitations, pedal edema, chest pain, wheezing, shortness of breath, cough or weight gain.   Overall he reports feeling well.   Past Medical History:  Diagnosis Date   AAA (abdominal aortic aneurysm) ([place])    a.) s/p repair in 2005   Anemia    Anginal pain ([place])    Anxiety    Arthritis    B12 deficiency    Bilateral cataracts    a.) s/p BILATERAL extractions in 2018   BPH with obstruction/lower urinary tract symptoms    CAD (coronary artery disease)    Carotid artery stenosis    a.) s/p CEA on the RIGHT   CHF (congestive heart failure) (HCC)    COPD (chronic obstructive pulmonary disease) ([place])    Diastolic dysfunction 74/25/9563   a.)  TTE 07/29/2019: EF 50-55%; LA mildly enlarged; G1DD.   DOE (dyspnea on exertion)    Elevated PSA    Environmental and seasonal allergies    History of 2019 novel coronavirus disease (COVID-19)    History of kidney stones    HLD (hyperlipidemia)    HOH (hard  of hearing)    HTN (hypertension)    Incomplete bladder emptying    Macular degeneration    Prostate cancer (HCC)    RBBB (right bundle branch block)    Valvular insufficiency    a.) TTE 07/29/2019: LVEF 50-55%; LA mild enlarged; triv AR/PR, mild Ruben, mod TR.   Past Surgical History:  Procedure Laterality Date   ABDOMINAL AORTIC ANEURYSM REPAIR     CATARACT EXTRACTION W/PHACO Right 09/25/2016   Procedure: CATARACT EXTRACTION PHACO AND INTRAOCULAR LENS PLACEMENT (IOC);  Surgeon: Birder Robson, MD;  Location: [facility] ORS;  Service: Ophthalmology;  Laterality: Right;  [place] 01:01 AP% 17.4 CDE 10.75 Fluid pack lot # [id number]   CATARACT EXTRACTION W/PHACO Left 10/16/2016   Procedure: CATARACT EXTRACTION PHACO AND INTRAOCULAR LENS PLACEMENT ([place]) Suture placed in Left eye;  Surgeon: Birder Robson, MD;  Location: [facility] ORS;  Service: Ophthalmology;  Laterality: Left;  [place] 2:06.8 AP% 22.2 CDE 28.17 Fluid pack lot # [id number]   COLONOSCOPY WITH PROPOFOL N/A 03/31/2019   Procedure: COLONOSCOPY WITH PROPOFOL;  Surgeon: Lollie Sails, MD;  Location: [facility] ENDOSCOPY;  Service: Endoscopy;  Laterality: N/A;   COLONOSCOPY WITH PROPOFOL N/A 09/09/2020   Procedure: COLONOSCOPY WITH PROPOFOL;  Surgeon: Lesly Rubenstein, MD;  Location: [facility] ENDOSCOPY;  Service: Endoscopy;  Laterality: N/A;   CYSTOSCOPY W/ RETROGRADES Bilateral 02/23/2020   Procedure: CYSTOSCOPY WITH RETROGRADE PYELOGRAM;  Surgeon: Abbie Sons, MD;  Location: [facility] ORS;  Service: Urology;  Laterality: Bilateral;   CYSTOSCOPY WITH BIOPSY N/A 02/23/2020   Procedure: CYSTOSCOPY WITH BIOPSY;  Surgeon: Abbie Sons, MD;  Location: [facility] ORS;  Service: Urology;  Laterality: N/A;   EYE SURGERY     HERNIA REPAIR     x 2   INGUINAL HERNIA REPAIR Right 12/02/2015   Procedure: HERNIA REPAIR INGUINAL ADULT;  Surgeon: Leonie Green, MD;  Location: [facility] ORS;  Service: General;  Laterality: Right;   PROSTATE SURGERY  2012   Right Carotid  Endarterectomy     TOTAL HIP ARTHROPLASTY Right 08/01/2021   Procedure: TOTAL HIP ARTHROPLASTY;  Surgeon: Corky Mull, MD;  Location: [facility] ORS;  Service: Orthopedics;  Laterality: Right;   Family History  Problem Relation Age of Onset   Cancer Brother        2000   Prostate cancer Neg Hx    Bladder Cancer Neg Hx    Kidney cancer Neg Hx    Social History   Tobacco Use   Smoking status: Former    Types: Cigarettes    Quit date: 11/30/1998    Years since quitting: 23.2   Smokeless tobacco: Former   Tobacco comments:    quit 10 years ago  Substance Use Topics   Alcohol use: Not Currently    Comment: occasional   Allergies  Allergen Reactions   Oxycodone Other (See Comments)    Caused hallucinations/does not want anymore   Prior to Admission medications   Medication Sig Start Date End Date Taking? Authorizing Provider  acetaminophen (TYLENOL) 500 MG tablet Take 1-2 tablets (500-1,000 mg total) by mouth every 6 (six) hours as needed for mild pain. 08/04/21  Yes Lattie Corns, PA-C  aspirin EC 81 MG tablet Take 1 tablet (81 mg total) by mouth daily. Swallow whole. 12/29/21  Yes Wyvonnia Dusky, MD  atorvastatin (LIPITOR) 40 MG tablet Take 40 mg by mouth daily.   Yes [provider]  Fluticasone-Umeclidin-Vilant (TRELEGY ELLIPTA) 100-62.5-25 MCG/INH AEPB Inhale 2 puffs into the lungs daily. 09/01/19  Yes [provider]  iron polysaccharides (NIFEREX) 150 MG capsule Take 150 mg by mouth 2 (two) times daily. 10/23/21  Yes [provider]  LORazepam (ATIVAN) 0.5 MG tablet Take 0.5 mg by mouth daily as needed for anxiety. 10/23/21  Yes [provider]  tamsulosin (FLOMAX) 0.4 MG CAPS capsule Take 1 capsule (0.4 mg total) by mouth daily. 08/04/21  Yes Lattie Corns, PA-C  amLODipine (NORVASC) 10 MG tablet Take 1 tablet (10 mg total) by mouth daily. 12/29/21 02/08/22  Wyvonnia Dusky, MD  citalopram (CELEXA) 10 MG tablet Take 1 tablet (10 mg  total) by mouth daily. Patient not taking: Reported on 01/11/2022 12/29/21 01/28/22  Wyvonnia Dusky, MD  metoprolol succinate (TOPROL-XL) 50 MG 24 hr tablet Take 1 tablet (50 mg total) by mouth daily. Take with or immediately following a meal. 12/29/21 02/08/22  Wyvonnia Dusky, MD    Review of Systems  Constitutional:  Positive for fatigue. Negative for appetite change.  HENT:  Negative for congestion, postnasal drip and sore throat.   Eyes: Negative.   Respiratory:  Negative for cough, shortness of breath and wheezing.   Cardiovascular:  Negative for chest pain, palpitations and leg swelling.  Gastrointestinal:  Negative for abdominal distention and abdominal pain.  Endocrine: Negative.   Genitourinary: Negative.   Musculoskeletal:  Negative for back pain and neck pain.  Skin: Negative.   Allergic/Immunologic:  Negative.   Neurological:  Negative for dizziness and facial asymmetry.  Hematological:  Negative for adenopathy. Bruises/bleeds easily.  Psychiatric/Behavioral:  Negative for dysphoric mood and sleep disturbance. The patient is nervous/anxious.    Vitals:   03/07/22 1514  BP: (!) 133/45  Pulse: 66  Resp: 20  SpO2: 100%  Weight: 167 lb 2 oz (75.8 kg)  Height: '5\' 2"'$  (1.575 m)   Wt Readings from Last 3 Encounters:  03/07/22 167 lb 2 oz (75.8 kg)  02/22/22 165 lb (74.8 kg)  02/08/22 164 lb 1.6 oz (74.4 kg)   Lab Results  Component Value Date   CREATININE 1.35 (H) 12/28/2021   CREATININE 1.51 (H) 12/27/2021   CREATININE 1.34 (H) 12/26/2021   Physical Exam Vitals and nursing note reviewed. Exam conducted with a chaperone present (wife).  Constitutional:      Appearance: Normal appearance.  HENT:     Head: Normocephalic and atraumatic.  Cardiovascular:     Rate and Rhythm: Normal rate and regular rhythm.  Pulmonary:     Effort: Pulmonary effort is normal. No respiratory distress.     Breath sounds: No wheezing or rales.  Abdominal:     General: There is no  distension.     Palpations: Abdomen is soft.  Musculoskeletal:        General: No tenderness.     Cervical back: Normal range of motion and neck supple.     Right lower leg: Edema (trace pitting) present.     Left lower leg: No edema.  Skin:    General: Skin is warm and dry.  Neurological:     General: No focal deficit present.     Mental Status: He is alert and oriented to person, place, and time.  Psychiatric:        Mood and Affect: Mood normal.        Behavior: Behavior normal.        Thought Content: Thought content normal.    Assessment & Plan:  1: Chronic heart failure with preserved ejection fraction with structural changes (LVH)- - NYHA class II - euvolemic today - weighing daily; reminded to call for an overnight weight gain of > 2 pounds or a weekly weight gain of > 5 pounds - weight up 5 pounds from last visit here 2 months ago; reports eating quite well - not adding salt but prior to admission, used salt all the time; reviewed the importance and rational for not adding any salt - saw cardiology Nehemiah Massed) 01/17/22 - encouraged him to be active with allowing for frequent rest periods - BNP 12/23/21 was 415.5  2: HTN- - BP looks good (133/45) - saw PCP (Hande) 01/23/22 - BMP 01/16/22 reviewed and showed sodium 138, potassium 4.9, creatinine 1.4 & GFR 48  3: COPD- - saw pulmonology Lanney Gins) 02/08/22 - has oxygen at home but doesn't use it much - advised that he probably doesn't need it during the day unless his oxygen level gets <90%  4: Anemia- - saw hematology Tasia Catchings) 02/08/22 - last iron infusion was 03/02/22 - Hg 02/08/22 was 10.3   Patient speaks some English and wife/son helped with interpreting. They didn't want an interpreter present.   Due to HF stability, will not make a return appointment at this time. Advised patient and his wife that they could call back for any questions or to make another appointment. They were comfortable with this plan.

## 2022-03-07 ENCOUNTER — Ambulatory Visit: Payer: PPO | Attending: Family | Admitting: Family

## 2022-03-07 ENCOUNTER — Encounter: Payer: Self-pay | Admitting: Family

## 2022-03-07 VITALS — BP 133/45 | HR 66 | Resp 20 | Ht 62.0 in | Wt 167.1 lb

## 2022-03-07 DIAGNOSIS — Z8679 Personal history of other diseases of the circulatory system: Secondary | ICD-10-CM | POA: Insufficient documentation

## 2022-03-07 DIAGNOSIS — Z8546 Personal history of malignant neoplasm of prostate: Secondary | ICD-10-CM | POA: Diagnosis not present

## 2022-03-07 DIAGNOSIS — D631 Anemia in chronic kidney disease: Secondary | ICD-10-CM

## 2022-03-07 DIAGNOSIS — N189 Chronic kidney disease, unspecified: Secondary | ICD-10-CM

## 2022-03-07 DIAGNOSIS — F419 Anxiety disorder, unspecified: Secondary | ICD-10-CM | POA: Insufficient documentation

## 2022-03-07 DIAGNOSIS — J449 Chronic obstructive pulmonary disease, unspecified: Secondary | ICD-10-CM | POA: Diagnosis not present

## 2022-03-07 DIAGNOSIS — I5032 Chronic diastolic (congestive) heart failure: Secondary | ICD-10-CM

## 2022-03-07 DIAGNOSIS — E785 Hyperlipidemia, unspecified: Secondary | ICD-10-CM | POA: Insufficient documentation

## 2022-03-07 DIAGNOSIS — Z87891 Personal history of nicotine dependence: Secondary | ICD-10-CM | POA: Diagnosis not present

## 2022-03-07 DIAGNOSIS — I10 Essential (primary) hypertension: Secondary | ICD-10-CM

## 2022-03-07 DIAGNOSIS — D649 Anemia, unspecified: Secondary | ICD-10-CM | POA: Insufficient documentation

## 2022-03-07 DIAGNOSIS — I2510 Atherosclerotic heart disease of native coronary artery without angina pectoris: Secondary | ICD-10-CM | POA: Insufficient documentation

## 2022-03-07 DIAGNOSIS — I110 Hypertensive heart disease with heart failure: Secondary | ICD-10-CM | POA: Insufficient documentation

## 2022-03-07 NOTE — Patient Instructions (Addendum)
Continue weighing daily and call for an overnight weight gain of 3 pounds or more or a weekly weight gain of more than 5 pounds.   If you have voicemail, please make sure your mailbox is cleaned out so that we may leave a message and please make sure to listen to any voicemails.    Call us if you need [place] for anything

## 2022-03-08 ENCOUNTER — Encounter: Payer: Self-pay | Admitting: Family

## 2022-03-09 ENCOUNTER — Inpatient Hospital Stay: Payer: PPO

## 2022-03-09 VITALS — BP 129/57 | HR 79 | Temp 97.7°F | Resp 16

## 2022-03-09 DIAGNOSIS — D500 Iron deficiency anemia secondary to blood loss (chronic): Secondary | ICD-10-CM

## 2022-03-09 DIAGNOSIS — D509 Iron deficiency anemia, unspecified: Secondary | ICD-10-CM | POA: Diagnosis not present

## 2022-03-09 MED ORDER — SODIUM CHLORIDE 0.9 % IV SOLN
Freq: Once | INTRAVENOUS | Status: AC
  Filled 2022-03-09: qty 250

## 2022-03-09 MED ORDER — SODIUM CHLORIDE 0.9 % IV SOLN
200.0000 mg | Freq: Once | INTRAVENOUS | Status: AC
  Administered 2022-03-09: 200 mg via INTRAVENOUS
  Filled 2022-03-09: qty 200

## 2022-03-16 ENCOUNTER — Inpatient Hospital Stay: Payer: PPO

## 2022-03-16 ENCOUNTER — Encounter: Payer: Self-pay | Admitting: Urology

## 2022-03-16 VITALS — BP 124/55 | HR 77 | Temp 97.2°F | Resp 16

## 2022-03-16 DIAGNOSIS — D500 Iron deficiency anemia secondary to blood loss (chronic): Secondary | ICD-10-CM

## 2022-03-16 DIAGNOSIS — D509 Iron deficiency anemia, unspecified: Secondary | ICD-10-CM | POA: Diagnosis not present

## 2022-03-16 MED ORDER — SODIUM CHLORIDE 0.9 % IV SOLN
200.0000 mg | Freq: Once | INTRAVENOUS | Status: AC
  Administered 2022-03-16: 200 mg via INTRAVENOUS
  Filled 2022-03-16: qty 200

## 2022-03-16 MED ORDER — SODIUM CHLORIDE 0.9 % IV SOLN
Freq: Once | INTRAVENOUS | Status: AC
  Filled 2022-03-16: qty 250

## 2022-03-21 DIAGNOSIS — I2510 Atherosclerotic heart disease of native coronary artery without angina pectoris: Secondary | ICD-10-CM | POA: Diagnosis not present

## 2022-03-21 DIAGNOSIS — Z8616 Personal history of COVID-19: Secondary | ICD-10-CM | POA: Diagnosis not present

## 2022-03-21 DIAGNOSIS — Z8546 Personal history of malignant neoplasm of prostate: Secondary | ICD-10-CM | POA: Diagnosis not present

## 2022-03-21 DIAGNOSIS — Z87891 Personal history of nicotine dependence: Secondary | ICD-10-CM | POA: Diagnosis not present

## 2022-03-21 DIAGNOSIS — I509 Heart failure, unspecified: Secondary | ICD-10-CM | POA: Diagnosis not present

## 2022-03-21 DIAGNOSIS — Z9981 Dependence on supplemental oxygen: Secondary | ICD-10-CM | POA: Diagnosis not present

## 2022-03-21 DIAGNOSIS — E785 Hyperlipidemia, unspecified: Secondary | ICD-10-CM | POA: Diagnosis not present

## 2022-03-21 DIAGNOSIS — N401 Enlarged prostate with lower urinary tract symptoms: Secondary | ICD-10-CM | POA: Diagnosis not present

## 2022-03-21 DIAGNOSIS — I110 Hypertensive heart disease with heart failure: Secondary | ICD-10-CM | POA: Diagnosis not present

## 2022-03-21 DIAGNOSIS — J449 Chronic obstructive pulmonary disease, unspecified: Secondary | ICD-10-CM | POA: Diagnosis not present

## 2022-03-26 DIAGNOSIS — I509 Heart failure, unspecified: Secondary | ICD-10-CM | POA: Diagnosis not present

## 2022-03-26 DIAGNOSIS — Z9981 Dependence on supplemental oxygen: Secondary | ICD-10-CM | POA: Diagnosis not present

## 2022-03-26 DIAGNOSIS — E785 Hyperlipidemia, unspecified: Secondary | ICD-10-CM | POA: Diagnosis not present

## 2022-03-26 DIAGNOSIS — N401 Enlarged prostate with lower urinary tract symptoms: Secondary | ICD-10-CM | POA: Diagnosis not present

## 2022-03-26 DIAGNOSIS — J449 Chronic obstructive pulmonary disease, unspecified: Secondary | ICD-10-CM | POA: Diagnosis not present

## 2022-03-26 DIAGNOSIS — I2510 Atherosclerotic heart disease of native coronary artery without angina pectoris: Secondary | ICD-10-CM | POA: Diagnosis not present

## 2022-03-26 DIAGNOSIS — I110 Hypertensive heart disease with heart failure: Secondary | ICD-10-CM | POA: Diagnosis not present

## 2022-04-05 DIAGNOSIS — D090 Carcinoma in situ of bladder: Secondary | ICD-10-CM | POA: Diagnosis not present

## 2022-04-05 DIAGNOSIS — C61 Malignant neoplasm of prostate: Secondary | ICD-10-CM | POA: Diagnosis not present

## 2022-04-05 DIAGNOSIS — H44001 Unspecified purulent endophthalmitis, right eye: Secondary | ICD-10-CM | POA: Diagnosis not present

## 2022-04-05 DIAGNOSIS — Z96641 Presence of right artificial hip joint: Secondary | ICD-10-CM | POA: Diagnosis not present

## 2022-05-06 DIAGNOSIS — H44001 Unspecified purulent endophthalmitis, right eye: Secondary | ICD-10-CM | POA: Diagnosis not present

## 2022-05-06 DIAGNOSIS — C61 Malignant neoplasm of prostate: Secondary | ICD-10-CM | POA: Diagnosis not present

## 2022-05-06 DIAGNOSIS — D090 Carcinoma in situ of bladder: Secondary | ICD-10-CM | POA: Diagnosis not present

## 2022-05-06 DIAGNOSIS — Z96641 Presence of right artificial hip joint: Secondary | ICD-10-CM | POA: Diagnosis not present

## 2022-05-08 ENCOUNTER — Inpatient Hospital Stay: Payer: PPO | Attending: Internal Medicine

## 2022-05-08 DIAGNOSIS — D500 Iron deficiency anemia secondary to blood loss (chronic): Secondary | ICD-10-CM | POA: Insufficient documentation

## 2022-05-08 DIAGNOSIS — C61 Malignant neoplasm of prostate: Secondary | ICD-10-CM | POA: Insufficient documentation

## 2022-05-08 DIAGNOSIS — N189 Chronic kidney disease, unspecified: Secondary | ICD-10-CM

## 2022-05-08 LAB — CBC WITH DIFFERENTIAL/PLATELET
Abs Immature Granulocytes: 0.12 10*3/uL — ABNORMAL HIGH (ref 0.00–0.07)
Basophils Absolute: 0 10*3/uL (ref 0.0–0.1)
Basophils Relative: 0 %
Eosinophils Absolute: 0 10*3/uL (ref 0.0–0.5)
Eosinophils Relative: 0 %
HCT: 38.3 % — ABNORMAL LOW (ref 39.0–52.0)
Hemoglobin: 12.1 g/dL — ABNORMAL LOW (ref 13.0–17.0)
Immature Granulocytes: 2 %
Lymphocytes Relative: 11 %
Lymphs Abs: 0.7 10*3/uL (ref 0.7–4.0)
MCH: 30 pg (ref 26.0–34.0)
MCHC: 31.6 g/dL (ref 30.0–36.0)
MCV: 94.8 fL (ref 80.0–100.0)
Monocytes Absolute: 0.6 10*3/uL (ref 0.1–1.0)
Monocytes Relative: 9 %
Neutro Abs: 5.1 10*3/uL (ref 1.7–7.7)
Neutrophils Relative %: 78 %
Platelets: 166 10*3/uL (ref 150–400)
RBC: 4.04 MIL/uL — ABNORMAL LOW (ref 4.22–5.81)
RDW: 14.4 % (ref 11.5–15.5)
WBC: 6.6 10*3/uL (ref 4.0–10.5)
nRBC: 0 % (ref 0.0–0.2)

## 2022-05-08 LAB — RETIC PANEL
Immature Retic Fract: 16.9 % — ABNORMAL HIGH (ref 2.3–15.9)
RBC.: 4.02 MIL/uL — ABNORMAL LOW (ref 4.22–5.81)
Retic Count, Absolute: 74 10*3/uL (ref 19.0–186.0)
Retic Ct Pct: 1.8 % (ref 0.4–3.1)
Reticulocyte Hemoglobin: 33.9 pg (ref >27.9)

## 2022-05-08 LAB — IRON AND TIBC
Iron: 77 ug/dL (ref 45–182)
Saturation Ratios: 23 % (ref 17.9–39.5)
TIBC: 332 ug/dL (ref 250–450)
UIBC: 255 ug/dL

## 2022-05-08 LAB — FERRITIN: Ferritin: 104 ng/mL (ref 24–336)

## 2022-05-14 ENCOUNTER — Inpatient Hospital Stay: Payer: PPO

## 2022-05-14 ENCOUNTER — Inpatient Hospital Stay (HOSPITAL_BASED_OUTPATIENT_CLINIC_OR_DEPARTMENT_OTHER): Payer: PPO | Admitting: Oncology

## 2022-05-14 ENCOUNTER — Encounter: Payer: Self-pay | Admitting: Oncology

## 2022-05-14 VITALS — BP 133/82 | HR 70 | Temp 97.4°F | Resp 18 | Wt 171.7 lb

## 2022-05-14 DIAGNOSIS — D500 Iron deficiency anemia secondary to blood loss (chronic): Secondary | ICD-10-CM

## 2022-05-14 DIAGNOSIS — C61 Malignant neoplasm of prostate: Secondary | ICD-10-CM | POA: Diagnosis not present

## 2022-05-14 MED ORDER — SODIUM CHLORIDE 0.9 % IV SOLN
INTRAVENOUS | Status: DC
  Filled 2022-05-14: qty 250

## 2022-05-14 MED ORDER — SODIUM CHLORIDE 0.9 % IV SOLN
200.0000 mg | Freq: Once | INTRAVENOUS | Status: AC
  Administered 2022-05-14: 200 mg via INTRAVENOUS
  Filled 2022-05-14: qty 200

## 2022-05-14 NOTE — Assessment & Plan Note (Signed)
Check CBC, iron TIBC ferritin, Patient has been on oral iron supplementation. I discussed about IV Venofer options. I discussed about the potential risks including but not limited to allergic reactions/infusion reactions including anaphylactic reactions, phlebitis, high blood pressure, wheezing, SOB, skin rash, weight gain, leg swelling, headache, nausea and fatigue, etc. Patient agrees with IV Venofer as he desires to achieved higher level of iron faster for adequate hematopoesis. Today's lab results were available of the patient's encounter. Improved hemoglobin and iron panel, proceed with IV vneofer x 1  Recommend him to resume oral iron supplementation for maintenance.  Etiology of iron deficiency is likely due to chronic blood loss.  Likely due to  proctitis.  Encourage GI evaluation.  Patient declines.

## 2022-05-14 NOTE — Progress Notes (Signed)
Pt and family understood the reason to wait the 30 min after iron infusion. They both refused. Family member had to go back to work.

## 2022-05-14 NOTE — Progress Notes (Signed)
Hematology/Oncology Consult note [phone] [phone]      Patient Care Team: Tracie Harrier, MD as PCP - General (Internal Medicine) Noreene Filbert, MD as Referring Physician (Radiation Oncology)   REFERRING PROVIDER: Tracie Harrier, MD  CHIEF COMPLAINTS/REASON FOR VISIT:  Iron deficiency Anemia  ASSESSMENT & PLAN:  Iron deficiency anemia due to chronic blood loss Check CBC, iron TIBC ferritin, Patient has been on oral iron supplementation. I discussed about IV Venofer options. I discussed about the potential risks including but not limited to allergic reactions/infusion reactions including anaphylactic reactions, phlebitis, high blood pressure, wheezing, SOB, skin rash, weight gain, leg swelling, headache, nausea and fatigue, etc. Patient agrees with IV Venofer as he desires to achieved higher level of iron faster for adequate hematopoesis. Today's lab results were available of the patient's encounter. Improved hemoglobin and iron panel, proceed with IV vneofer x 1  Recommend him to resume oral iron supplementation for maintenance.  Etiology of iron deficiency is likely due to chronic blood loss.  Likely due to  proctitis.  Encourage GI evaluation.  Patient declines.     Prostate cancer [facility]) Previously treated with radiation.  PSA <0.01 in April 2023 Recommend patient to continue follow-up with urology and radiation oncology.  Orders Placed This Encounter  Procedures   CBC with Differential/Platelet    Standing Status:   Future    Standing Expiration Date:   05/14/2023   Retic Panel    Standing Status:   Future    Standing Expiration Date:   05/15/2023   Iron and TIBC([facility])    Standing Status:   Future    Standing Expiration Date:   05/15/2023   Ferritin    Standing Status:   Future    Standing Expiration Date:   05/15/2023   Follow-up  Follow-up in 4 months All questions were answered. The patient knows to call the  clinic with any problems, questions or concerns.  Ruben Server, MD, PhD [facility] Hematology Oncology 05/14/2022     HISTORY OF PRESENTING ILLNESS:  Ruben Green is a  88 y.o.  male with PMH listed below who was referred to me for anemia Reviewed patient's recent labs that was done.  He was found to have abnormal CBC on 01/16/2022, hemoglobin 8.8, MCV 88.3, Reviewed patient's previous labs ordered by primary care physician's office, anemia is chronic onset, gradually worsening.  Patient has chronic kidney disease. He denies recent chest pain on exertion,  pre-syncopal episodes, or palpitations He had not noticed any recent bleeding such as epistaxis, hematuria  He denies over the counter NSAID ingestion. His last colonoscopy was  He denies any pica and eats a variety of diet. Chronic shortness of breath with exertion due to COPD. Patient was accompanied by wife.   Patient has history of prostate cancer that was treated with radiation in 2017 by Dr. Baruch Gouty.  Patient has had proctitis and intermittently he has blood in the stool.  INTERVAL HISTORY Ruben Green is a 88 y.o. male who has above history reviewed by me today presents for follow up visit for iron deficiency anemia Patient speaks [place].  He had a hard hearing.  History was obtained primarily from wife.  Patient declines [place] interpreter service Fatigue is much better. Accompanied by wife Ruben Green.  MEDICAL HISTORY:  Past Medical History:  Diagnosis Date   AAA (abdominal aortic aneurysm) ([place])    a.) s/p repair in 2005   Anemia    Anginal pain ([place])  Anxiety    Arthritis    B12 deficiency    Bilateral cataracts    a.) s/p BILATERAL extractions in 2018   BPH with obstruction/lower urinary tract symptoms    CAD (coronary artery disease)    Carotid artery stenosis    a.) s/p CEA on the RIGHT   CHF (congestive heart failure) (HCC)    COPD (chronic obstructive pulmonary disease) ([place])    Diastolic dysfunction  73/41/9379   a.)  TTE 07/29/2019: EF 50-55%; LA mildly enlarged; G1DD.   DOE (dyspnea on exertion)    Elevated PSA    Environmental and seasonal allergies    History of 2019 novel coronavirus disease (COVID-19)    History of kidney stones    HLD (hyperlipidemia)    HOH (hard of hearing)    HTN (hypertension)    Incomplete bladder emptying    Macular degeneration    Prostate cancer (HCC)    RBBB (right bundle branch block)    Valvular insufficiency    a.) TTE 07/29/2019: LVEF 50-55%; LA mild enlarged; triv AR/PR, mild MR, mod TR.    SURGICAL HISTORY: Past Surgical History:  Procedure Laterality Date   ABDOMINAL AORTIC ANEURYSM REPAIR     CATARACT EXTRACTION W/PHACO Right 09/25/2016   Procedure: CATARACT EXTRACTION PHACO AND INTRAOCULAR LENS PLACEMENT (IOC);  Surgeon: Birder Robson, MD;  Location: [facility] ORS;  Service: Ophthalmology;  Laterality: Right;  [place] 01:01 AP% 17.4 CDE 10.75 Fluid pack lot # [id number]   CATARACT EXTRACTION W/PHACO Left 10/16/2016   Procedure: CATARACT EXTRACTION PHACO AND INTRAOCULAR LENS PLACEMENT ([place]) Suture placed in Left eye;  Surgeon: Birder Robson, MD;  Location: [facility] ORS;  Service: Ophthalmology;  Laterality: Left;  [place] 2:06.8 AP% 22.2 CDE 28.17 Fluid pack lot # [id number]   COLONOSCOPY WITH PROPOFOL N/A 03/31/2019   Procedure: COLONOSCOPY WITH PROPOFOL;  Surgeon: Lollie Sails, MD;  Location: [facility] ENDOSCOPY;  Service: Endoscopy;  Laterality: N/A;   COLONOSCOPY WITH PROPOFOL N/A 09/09/2020   Procedure: COLONOSCOPY WITH PROPOFOL;  Surgeon: Lesly Rubenstein, MD;  Location: [facility] ENDOSCOPY;  Service: Endoscopy;  Laterality: N/A;   CYSTOSCOPY W/ RETROGRADES Bilateral 02/23/2020   Procedure: CYSTOSCOPY WITH RETROGRADE PYELOGRAM;  Surgeon: Abbie Sons, MD;  Location: [facility] ORS;  Service: Urology;  Laterality: Bilateral;   CYSTOSCOPY WITH BIOPSY N/A 02/23/2020   Procedure: CYSTOSCOPY WITH BIOPSY;  Surgeon: Abbie Sons, MD;  Location: [facility] ORS;   Service: Urology;  Laterality: N/A;   EYE SURGERY     HERNIA REPAIR     x 2   INGUINAL HERNIA REPAIR Right 12/02/2015   Procedure: HERNIA REPAIR INGUINAL ADULT;  Surgeon: Leonie Green, MD;  Location: [facility] ORS;  Service: General;  Laterality: Right;   PROSTATE SURGERY  2012   Right Carotid Endarterectomy     TOTAL HIP ARTHROPLASTY Right 08/01/2021   Procedure: TOTAL HIP ARTHROPLASTY;  Surgeon: Corky Mull, MD;  Location: [facility] ORS;  Service: Orthopedics;  Laterality: Right;    SOCIAL HISTORY: Social History   Socioeconomic History   Marital status: Married    Spouse name: Not on file   Number of children: Not on file   Years of education: Not on file   Highest education level: Not on file  Occupational History   Not on file  Tobacco Use   Smoking status: Former    Types: Cigarettes    Quit date: 11/30/1998    Years since quitting: 23.4   Smokeless tobacco: Former   Tobacco comments:  quit 10 years ago  Vaping Use   Vaping Use: Never used  Substance and Sexual Activity   Alcohol use: Not Currently    Comment: occasional   Drug use: No   Sexual activity: Not on file  Other Topics Concern   Not on file  Social History Narrative   Not on file   Social Determinants of Health   Financial Resource Strain: Not on file  Food Insecurity: Not on file  Transportation Needs: Not on file  Physical Activity: Not on file  Stress: Not on file  Social Connections: Not on file  Intimate Partner Violence: Not on file    FAMILY HISTORY: Family History  Problem Relation Age of Onset   Cancer Brother        2000   Prostate cancer Neg Hx    Bladder Cancer Neg Hx    Kidney cancer Neg Hx     ALLERGIES:  is allergic to oxycodone.  MEDICATIONS:  Current Outpatient Medications  Medication Sig Dispense Refill   acetaminophen (TYLENOL) 500 MG tablet Take 1-2 tablets (500-1,000 mg total) by mouth every 6 (six) hours as needed for mild pain. 60 tablet 0   amLODipine  (NORVASC) 10 MG tablet Take 1 tablet (10 mg total) by mouth daily. 30 tablet 0   aspirin EC 81 MG tablet Take 1 tablet (81 mg total) by mouth daily. Swallow whole. 30 tablet    atorvastatin (LIPITOR) 40 MG tablet Take 40 mg by mouth daily.     citalopram (CELEXA) 10 MG tablet Take 1 tablet (10 mg total) by mouth daily. 30 tablet 0   Fluticasone-Umeclidin-Vilant (TRELEGY ELLIPTA) 100-62.5-25 MCG/INH AEPB Inhale 2 puffs into the lungs daily.     iron polysaccharides (NIFEREX) 150 MG capsule Take 150 mg by mouth 2 (two) times daily.     LORazepam (ATIVAN) 0.5 MG tablet Take 0.5 mg by mouth daily as needed for anxiety.     metoprolol succinate (TOPROL-XL) 50 MG 24 hr tablet Take 1 tablet (50 mg total) by mouth daily. Take with or immediately following a meal. 30 tablet 0   tamsulosin (FLOMAX) 0.4 MG CAPS capsule Take 1 capsule (0.4 mg total) by mouth daily. 30 capsule 0   No current facility-administered medications for this visit.    Review of Systems  Constitutional:  Positive for fatigue. Negative for chills and fever.  HENT:   Positive for hearing loss. Negative for voice change.   Eyes:  Negative for eye problems and icterus.  Respiratory:  Positive for shortness of breath. Negative for chest tightness and cough.   Cardiovascular:  Negative for chest pain and leg swelling.  Gastrointestinal:  Negative for abdominal distention and abdominal pain.  Endocrine: Negative for hot flashes.  Genitourinary:  Negative for difficulty urinating, dysuria and frequency.   Musculoskeletal:  Negative for arthralgias.  Skin:  Negative for itching and rash.  Neurological:  Negative for light-headedness and numbness.  Hematological:  Negative for adenopathy. Does not bruise/bleed easily.  Psychiatric/Behavioral:  Negative for confusion.     PHYSICAL EXAMINATION: ECOG PERFORMANCE STATUS: 1 - Symptomatic but completely ambulatory Vitals:   05/14/22 1449  BP: 133/82  Pulse: 70  Resp: 18  Temp: (!)  97.4 F (36.3 C)   Filed Weights   05/14/22 1449  Weight: 171 lb 11.2 oz (77.9 kg)    Physical Exam Constitutional:      General: He is not in acute distress. HENT:     Head: Normocephalic and atraumatic.  Eyes:     General: No scleral icterus. Cardiovascular:     Rate and Rhythm: Normal rate and regular rhythm.     Heart sounds: Normal heart sounds.  Pulmonary:     Effort: Pulmonary effort is normal. No respiratory distress.     Breath sounds: No wheezing.     Comments: Significantly decreased breath sound bilaterally Abdominal:     General: Bowel sounds are normal. There is no distension.     Palpations: Abdomen is soft.  Musculoskeletal:        General: No deformity. Normal range of motion.     Cervical back: Normal range of motion and neck supple.  Skin:    General: Skin is warm and dry.     Findings: No erythema or rash.  Neurological:     Mental Status: He is alert and oriented to person, place, and time. Mental status is at baseline.     Cranial Nerves: No cranial nerve deficit.     Coordination: Coordination normal.  Psychiatric:        Mood and Affect: Mood normal.      LABORATORY DATA:  I have reviewed the data as listed    Latest Ref Rng & Units 05/08/2022    3:14 PM 02/08/2022   10:21 AM 12/28/2021    4:58 AM  CBC  WBC 4.0 - 10.5 K/uL 6.6  10.7  10.7   Hemoglobin 13.0 - 17.0 g/dL 12.1  10.3  10.1   Hematocrit 39.0 - 52.0 % 38.3  34.5  33.1   Platelets 150 - 400 K/uL 166  220  337       Latest Ref Rng & Units 12/28/2021   12:59 PM 12/28/2021    4:58 AM 12/27/2021    4:34 AM  CMP  Glucose 70 - 99 mg/dL  103  168   BUN 8 - 23 mg/dL  53  60   Creatinine 0.61 - 1.24 mg/dL  1.35  1.51   Sodium 135 - 145 mmol/L  136  136   Potassium 3.5 - 5.1 mmol/L 4.9  5.3  4.8   Chloride 98 - 111 mmol/L  106  107   CO2 22 - 32 mmol/L  24  22   Calcium 8.9 - 10.3 mg/dL  8.8  8.6       Component Value Date/Time   IRON 77 05/08/2022 1514   TIBC 332 05/08/2022  1514   FERRITIN 104 05/08/2022 1514   IRONPCTSAT 23 05/08/2022 1514     RADIOGRAPHIC STUDIES: I have personally reviewed the radiological images as listed and agreed with the findings in the report. No results found.

## 2022-05-14 NOTE — Assessment & Plan Note (Addendum)
Previously treated with radiation.  PSA <0.01 in April 2023 Recommend patient to continue follow-up with urology and radiation oncology.

## 2022-05-15 DIAGNOSIS — R059 Cough, unspecified: Secondary | ICD-10-CM | POA: Diagnosis not present

## 2022-05-15 DIAGNOSIS — Z03818 Encounter for observation for suspected exposure to other biological agents ruled out: Secondary | ICD-10-CM | POA: Diagnosis not present

## 2022-05-15 DIAGNOSIS — J441 Chronic obstructive pulmonary disease with (acute) exacerbation: Secondary | ICD-10-CM | POA: Diagnosis not present

## 2022-06-04 DIAGNOSIS — Z8669 Personal history of other diseases of the nervous system and sense organs: Secondary | ICD-10-CM | POA: Diagnosis not present

## 2022-06-04 DIAGNOSIS — H353122 Nonexudative age-related macular degeneration, left eye, intermediate dry stage: Secondary | ICD-10-CM | POA: Diagnosis not present

## 2022-06-04 DIAGNOSIS — Z961 Presence of intraocular lens: Secondary | ICD-10-CM | POA: Diagnosis not present

## 2022-06-04 DIAGNOSIS — H353212 Exudative age-related macular degeneration, right eye, with inactive choroidal neovascularization: Secondary | ICD-10-CM | POA: Diagnosis not present

## 2022-06-05 DIAGNOSIS — Z96641 Presence of right artificial hip joint: Secondary | ICD-10-CM | POA: Diagnosis not present

## 2022-06-05 DIAGNOSIS — H44001 Unspecified purulent endophthalmitis, right eye: Secondary | ICD-10-CM | POA: Diagnosis not present

## 2022-06-05 DIAGNOSIS — D090 Carcinoma in situ of bladder: Secondary | ICD-10-CM | POA: Diagnosis not present

## 2022-06-05 DIAGNOSIS — C61 Malignant neoplasm of prostate: Secondary | ICD-10-CM | POA: Diagnosis not present

## 2022-07-06 DIAGNOSIS — Z96641 Presence of right artificial hip joint: Secondary | ICD-10-CM | POA: Diagnosis not present

## 2022-07-06 DIAGNOSIS — D090 Carcinoma in situ of bladder: Secondary | ICD-10-CM | POA: Diagnosis not present

## 2022-07-06 DIAGNOSIS — C61 Malignant neoplasm of prostate: Secondary | ICD-10-CM | POA: Diagnosis not present

## 2022-07-06 DIAGNOSIS — H44001 Unspecified purulent endophthalmitis, right eye: Secondary | ICD-10-CM | POA: Diagnosis not present

## 2022-07-09 DIAGNOSIS — J449 Chronic obstructive pulmonary disease, unspecified: Secondary | ICD-10-CM | POA: Diagnosis not present

## 2022-07-09 DIAGNOSIS — D649 Anemia, unspecified: Secondary | ICD-10-CM | POA: Diagnosis not present

## 2022-07-09 DIAGNOSIS — E538 Deficiency of other specified B group vitamins: Secondary | ICD-10-CM | POA: Diagnosis not present

## 2022-07-09 DIAGNOSIS — F411 Generalized anxiety disorder: Secondary | ICD-10-CM | POA: Diagnosis not present

## 2022-07-09 DIAGNOSIS — R7309 Other abnormal glucose: Secondary | ICD-10-CM | POA: Diagnosis not present

## 2022-07-09 DIAGNOSIS — J189 Pneumonia, unspecified organism: Secondary | ICD-10-CM | POA: Diagnosis not present

## 2022-07-09 DIAGNOSIS — I10 Essential (primary) hypertension: Secondary | ICD-10-CM | POA: Diagnosis not present

## 2022-07-16 DIAGNOSIS — J438 Other emphysema: Secondary | ICD-10-CM | POA: Diagnosis not present

## 2022-07-16 DIAGNOSIS — Z0000 Encounter for general adult medical examination without abnormal findings: Secondary | ICD-10-CM | POA: Diagnosis not present

## 2022-07-16 DIAGNOSIS — I2510 Atherosclerotic heart disease of native coronary artery without angina pectoris: Secondary | ICD-10-CM | POA: Diagnosis not present

## 2022-07-16 DIAGNOSIS — F419 Anxiety disorder, unspecified: Secondary | ICD-10-CM | POA: Diagnosis not present

## 2022-07-16 DIAGNOSIS — C61 Malignant neoplasm of prostate: Secondary | ICD-10-CM | POA: Diagnosis not present

## 2022-07-16 DIAGNOSIS — J209 Acute bronchitis, unspecified: Secondary | ICD-10-CM | POA: Diagnosis not present

## 2022-07-16 DIAGNOSIS — E782 Mixed hyperlipidemia: Secondary | ICD-10-CM | POA: Diagnosis not present

## 2022-07-16 DIAGNOSIS — R7309 Other abnormal glucose: Secondary | ICD-10-CM | POA: Diagnosis not present

## 2022-07-16 DIAGNOSIS — Z6831 Body mass index (BMI) 31.0-31.9, adult: Secondary | ICD-10-CM | POA: Diagnosis not present

## 2022-07-16 DIAGNOSIS — D649 Anemia, unspecified: Secondary | ICD-10-CM | POA: Diagnosis not present

## 2022-07-16 DIAGNOSIS — E538 Deficiency of other specified B group vitamins: Secondary | ICD-10-CM | POA: Diagnosis not present

## 2022-07-16 DIAGNOSIS — J3489 Other specified disorders of nose and nasal sinuses: Secondary | ICD-10-CM | POA: Diagnosis not present

## 2022-07-24 DIAGNOSIS — M25561 Pain in right knee: Secondary | ICD-10-CM | POA: Diagnosis not present

## 2022-07-24 DIAGNOSIS — G8929 Other chronic pain: Secondary | ICD-10-CM | POA: Diagnosis not present

## 2022-07-24 DIAGNOSIS — M1711 Unilateral primary osteoarthritis, right knee: Secondary | ICD-10-CM | POA: Diagnosis not present

## 2022-07-25 DIAGNOSIS — G8929 Other chronic pain: Secondary | ICD-10-CM | POA: Diagnosis not present

## 2022-07-25 DIAGNOSIS — M25561 Pain in right knee: Secondary | ICD-10-CM | POA: Diagnosis not present

## 2022-07-25 DIAGNOSIS — M1711 Unilateral primary osteoarthritis, right knee: Secondary | ICD-10-CM | POA: Diagnosis not present

## 2022-08-06 DIAGNOSIS — H44001 Unspecified purulent endophthalmitis, right eye: Secondary | ICD-10-CM | POA: Diagnosis not present

## 2022-08-06 DIAGNOSIS — Z96641 Presence of right artificial hip joint: Secondary | ICD-10-CM | POA: Diagnosis not present

## 2022-08-06 DIAGNOSIS — C61 Malignant neoplasm of prostate: Secondary | ICD-10-CM | POA: Diagnosis not present

## 2022-08-06 DIAGNOSIS — D090 Carcinoma in situ of bladder: Secondary | ICD-10-CM | POA: Diagnosis not present

## 2022-08-20 DIAGNOSIS — F411 Generalized anxiety disorder: Secondary | ICD-10-CM | POA: Diagnosis not present

## 2022-08-20 DIAGNOSIS — N1831 Chronic kidney disease, stage 3a: Secondary | ICD-10-CM | POA: Diagnosis not present

## 2022-08-20 DIAGNOSIS — R7989 Other specified abnormal findings of blood chemistry: Secondary | ICD-10-CM | POA: Diagnosis not present

## 2022-08-20 DIAGNOSIS — I10 Essential (primary) hypertension: Secondary | ICD-10-CM | POA: Diagnosis not present

## 2022-08-20 DIAGNOSIS — H9313 Tinnitus, bilateral: Secondary | ICD-10-CM | POA: Diagnosis not present

## 2022-08-20 DIAGNOSIS — I714 Abdominal aortic aneurysm, without rupture, unspecified: Secondary | ICD-10-CM | POA: Diagnosis not present

## 2022-08-20 DIAGNOSIS — C61 Malignant neoplasm of prostate: Secondary | ICD-10-CM | POA: Diagnosis not present

## 2022-08-20 DIAGNOSIS — Z6833 Body mass index (BMI) 33.0-33.9, adult: Secondary | ICD-10-CM | POA: Diagnosis not present

## 2022-08-20 DIAGNOSIS — I6523 Occlusion and stenosis of bilateral carotid arteries: Secondary | ICD-10-CM | POA: Diagnosis not present

## 2022-08-23 ENCOUNTER — Other Ambulatory Visit: Payer: PPO | Admitting: Urology

## 2022-08-30 ENCOUNTER — Ambulatory Visit: Payer: PPO | Admitting: Urology

## 2022-08-30 ENCOUNTER — Encounter: Payer: Self-pay | Admitting: Urology

## 2022-08-30 VITALS — BP 130/74 | HR 86 | Ht 62.0 in | Wt 170.0 lb

## 2022-08-30 DIAGNOSIS — Z8546 Personal history of malignant neoplasm of prostate: Secondary | ICD-10-CM

## 2022-08-30 DIAGNOSIS — Z8551 Personal history of malignant neoplasm of bladder: Secondary | ICD-10-CM | POA: Diagnosis not present

## 2022-08-30 DIAGNOSIS — R310 Gross hematuria: Secondary | ICD-10-CM | POA: Diagnosis not present

## 2022-08-30 LAB — URINALYSIS, COMPLETE
Bilirubin, UA: NEGATIVE
Glucose, UA: NEGATIVE
Ketones, UA: NEGATIVE
Leukocytes,UA: NEGATIVE
Nitrite, UA: NEGATIVE
Protein,UA: NEGATIVE
RBC, UA: NEGATIVE
Specific Gravity, UA: 1.015 (ref 1.005–1.030)
Urobilinogen, Ur: 0.2 mg/dL (ref 0.2–1.0)
pH, UA: 5.5 (ref 5.0–7.5)

## 2022-08-30 LAB — MICROSCOPIC EXAMINATION

## 2022-08-30 NOTE — Progress Notes (Signed)
   08/30/22  CC:  Chief Complaint  Patient presents with   Cysto   Indications: Carcinoma in situ bladder biopsies 02/23/2020 Completed 6 week course of BCG induction 06/13/2020  HPI: No complaints today; denies gross hematuria  Blood pressure 126/69, pulse 73, height 5\' 2"  (1.575 m), weight 175 lb (79.4 kg). NED. A&Ox3.   No respiratory distress   Abd soft, NT, ND Normal phallus with bilateral descended testicles  Cystoscopy Procedure Note  Patient identification was confirmed, informed consent was obtained, and patient was prepped using Betadine solution.  Lidocaine jelly was administered per urethral meatus.     Pre-Procedure: - Inspection reveals a normal caliber urethral meatus.  Procedure: The flexible cystoscope was introduced without difficulty - No urethral strictures/lesions are present. -  Moderate lateral lobe enlargement  prostate  - Elevated bladder neck, moderate - Bilateral ureteral orifices identified - Bladder mucosa  reveals no ulcers, tumors, or lesions - No bladder stones -Moderate trabeculation with cellules/diverticula   Retroflexion shows small intravesical prostatic tissue with hypervascularity  Post-Procedure: - Patient tolerated the procedure well  Assessment/ Plan: No bladder mucosal abnormalities/recurrent tumor identified Surveillance cystoscopy 6 months History high risk prostate cancer and due for PSA April 2024.  He has an appointment with his PCP June 2024 and will asked that to be added onto his lab work at that visit.    Abbie Sons, MD

## 2022-09-04 DIAGNOSIS — H44001 Unspecified purulent endophthalmitis, right eye: Secondary | ICD-10-CM | POA: Diagnosis not present

## 2022-09-04 DIAGNOSIS — Z96641 Presence of right artificial hip joint: Secondary | ICD-10-CM | POA: Diagnosis not present

## 2022-09-04 DIAGNOSIS — C61 Malignant neoplasm of prostate: Secondary | ICD-10-CM | POA: Diagnosis not present

## 2022-09-04 DIAGNOSIS — D090 Carcinoma in situ of bladder: Secondary | ICD-10-CM | POA: Diagnosis not present

## 2022-09-10 ENCOUNTER — Inpatient Hospital Stay: Payer: PPO | Attending: Radiation Oncology

## 2022-09-10 DIAGNOSIS — D500 Iron deficiency anemia secondary to blood loss (chronic): Secondary | ICD-10-CM | POA: Diagnosis not present

## 2022-09-10 DIAGNOSIS — D631 Anemia in chronic kidney disease: Secondary | ICD-10-CM | POA: Diagnosis not present

## 2022-09-10 DIAGNOSIS — C61 Malignant neoplasm of prostate: Secondary | ICD-10-CM | POA: Diagnosis not present

## 2022-09-10 DIAGNOSIS — Z87891 Personal history of nicotine dependence: Secondary | ICD-10-CM | POA: Insufficient documentation

## 2022-09-10 DIAGNOSIS — I130 Hypertensive heart and chronic kidney disease with heart failure and stage 1 through stage 4 chronic kidney disease, or unspecified chronic kidney disease: Secondary | ICD-10-CM | POA: Diagnosis not present

## 2022-09-10 DIAGNOSIS — N189 Chronic kidney disease, unspecified: Secondary | ICD-10-CM | POA: Diagnosis not present

## 2022-09-10 LAB — CBC WITH DIFFERENTIAL/PLATELET
Abs Immature Granulocytes: 0.16 10*3/uL — ABNORMAL HIGH (ref 0.00–0.07)
Basophils Absolute: 0 10*3/uL (ref 0.0–0.1)
Basophils Relative: 0 %
Eosinophils Absolute: 0 10*3/uL (ref 0.0–0.5)
Eosinophils Relative: 0 %
HCT: 37.8 % — ABNORMAL LOW (ref 39.0–52.0)
Hemoglobin: 11.8 g/dL — ABNORMAL LOW (ref 13.0–17.0)
Immature Granulocytes: 2 %
Lymphocytes Relative: 12 %
Lymphs Abs: 0.9 10*3/uL (ref 0.7–4.0)
MCH: 30.8 pg (ref 26.0–34.0)
MCHC: 31.2 g/dL (ref 30.0–36.0)
MCV: 98.7 fL (ref 80.0–100.0)
Monocytes Absolute: 0.6 10*3/uL (ref 0.1–1.0)
Monocytes Relative: 8 %
Neutro Abs: 5.7 10*3/uL (ref 1.7–7.7)
Neutrophils Relative %: 78 %
Platelets: 202 10*3/uL (ref 150–400)
RBC: 3.83 MIL/uL — ABNORMAL LOW (ref 4.22–5.81)
RDW: 12.5 % (ref 11.5–15.5)
WBC: 7.3 10*3/uL (ref 4.0–10.5)
nRBC: 0 % (ref 0.0–0.2)

## 2022-09-10 LAB — RETIC PANEL
Immature Retic Fract: 15.8 % (ref 2.3–15.9)
RBC.: 3.82 MIL/uL — ABNORMAL LOW (ref 4.22–5.81)
Retic Count, Absolute: 68.8 10*3/uL (ref 19.0–186.0)
Retic Ct Pct: 1.8 % (ref 0.4–3.1)
Reticulocyte Hemoglobin: 32.8 pg (ref >27.9)

## 2022-09-10 LAB — IRON AND TIBC
Iron: 49 ug/dL (ref 45–182)
Saturation Ratios: 15 % — ABNORMAL LOW (ref 17.9–39.5)
TIBC: 318 ug/dL (ref 250–450)
UIBC: 269 ug/dL

## 2022-09-10 LAB — FERRITIN: Ferritin: 123 ng/mL (ref 24–336)

## 2022-09-11 ENCOUNTER — Ambulatory Visit: Payer: PPO | Admitting: Physician Assistant

## 2022-09-11 VITALS — BP 136/75 | Ht 62.0 in | Wt 168.0 lb

## 2022-09-11 DIAGNOSIS — R300 Dysuria: Secondary | ICD-10-CM

## 2022-09-11 DIAGNOSIS — R310 Gross hematuria: Secondary | ICD-10-CM | POA: Diagnosis not present

## 2022-09-11 LAB — URINALYSIS, COMPLETE
Bilirubin, UA: NEGATIVE
Glucose, UA: NEGATIVE
Ketones, UA: NEGATIVE
Nitrite, UA: POSITIVE — AB
Specific Gravity, UA: 1.015 (ref 1.005–1.030)
Urobilinogen, Ur: 0.2 mg/dL (ref 0.2–1.0)
pH, UA: 7 (ref 5.0–7.5)

## 2022-09-11 LAB — BLADDER SCAN AMB NON-IMAGING: Scan Result: 193

## 2022-09-11 LAB — MICROSCOPIC EXAMINATION: WBC, UA: >30 /hpf — AB (ref 0–5)

## 2022-09-11 MED ORDER — SULFAMETHOXAZOLE-TRIMETHOPRIM 800-160 MG PO TABS: 1.0000 | ORAL_TABLET | Freq: Two times a day (BID) | ORAL | 0 refills | Status: AC

## 2022-09-11 NOTE — Patient Instructions (Signed)
I sent a prescription for antibiotics to your pharmacy today. Please start this tonight.  I'm also getting a urine culture today. It should be back by early next week. If it shows that you are on the right antibiotics, I will not call you. I will only call if I need to switch the antibiotic based on the urine culture results.  If you are not fully better by the last day of your antibiotics, please call us and let us know so I can extend his antibiotics for an extra few days.

## 2022-09-11 NOTE — Progress Notes (Signed)
09/11/2022 3:07 PM   [name] 02/17/1934 [id number]  CC: Chief Complaint  Patient presents with   Follow-up   HPI: Ruben Green is a 89 y.o. male with PMH BPH with incomplete bladder emptying and CIS of the bladder who underwent surveillance cystoscopy with Dr. Bernardo Heater 12 days ago who presents today for evaluation of possible UTI.  He is accompanied today by his wife, who contributes to HPI.  Today he reports onset of dysuria the day after undergoing cystoscopy.  He denies fever, chills, nausea, or vomiting.  In-office UA today positive for 1+ blood, 2+ protein, nitrites, and 3+ leukocytes; urine microscopy with >30 WBCs/HPF, 3-10 RBCs/HPF, and many bacteria. PVR 113mL.  PMH: Past Medical History:  Diagnosis Date   AAA (abdominal aortic aneurysm) ([place])    a.) s/p repair in 2005   Anemia    Anginal pain ([place])    Anxiety    Arthritis    B12 deficiency    Bilateral cataracts    a.) s/p BILATERAL extractions in 2018   BPH with obstruction/lower urinary tract symptoms    CAD (coronary artery disease)    Carotid artery stenosis    a.) s/p CEA on the RIGHT   CHF (congestive heart failure) (HCC)    COPD (chronic obstructive pulmonary disease) ([place])    Diastolic dysfunction [id number]   a.)  TTE 07/29/2019: EF 50-55%; LA mildly enlarged; G1DD.   DOE (dyspnea on exertion)    Elevated PSA    Environmental and seasonal allergies    History of 2019 novel coronavirus disease (COVID-19)    History of kidney stones    HLD (hyperlipidemia)    HOH (hard of hearing)    HTN (hypertension)    Incomplete bladder emptying    Macular degeneration    Prostate cancer (HCC)    RBBB (right bundle branch block)    Valvular insufficiency    a.) TTE 07/29/2019: LVEF 50-55%; LA mild enlarged; triv AR/PR, mild MR, mod TR.    Surgical History: Past Surgical History:  Procedure Laterality Date   ABDOMINAL AORTIC ANEURYSM REPAIR     CATARACT EXTRACTION W/PHACO Right  09/25/2016   Procedure: CATARACT EXTRACTION PHACO AND INTRAOCULAR LENS PLACEMENT (IOC);  Surgeon: Birder Robson, MD;  Location: [facility] ORS;  Service: Ophthalmology;  Laterality: Right;  [place] 01:01 AP% 17.4 CDE 10.75 Fluid pack lot # [id number]   CATARACT EXTRACTION W/PHACO Left 10/16/2016   Procedure: CATARACT EXTRACTION PHACO AND INTRAOCULAR LENS PLACEMENT ([place]) Suture placed in Left eye;  Surgeon: Birder Robson, MD;  Location: [facility]S;  Service: Ophthalmology;  Laterality: Left;  [place] 2:06.8 AP% 22.2 CDE 28.17 Fluid pack lot # [id number]   COLONOSCOPY WITH PROPOFOL N/A 03/31/2019   Procedure: COLONOSCOPY WITH PROPOFOL;  Surgeon: Lollie Sails, MD;  Location: [facility] ENDOSCOPY;  Service: Endoscopy;  Laterality: N/A;   COLONOSCOPY WITH PROPOFOL N/A 09/09/2020   Procedure: COLONOSCOPY WITH PROPOFOL;  Surgeon: Lesly Rubenstein, MD;  Location: [facility] ENDOSCOPY;  Service: Endoscopy;  Laterality: N/A;   CYSTOSCOPY W/ RETROGRADES Bilateral 02/23/2020   Procedure: CYSTOSCOPY WITH RETROGRADE PYELOGRAM;  Surgeon: Abbie Sons, MD;  Location: [facility] ORS;  Service: Urology;  Laterality: Bilateral;   CYSTOSCOPY WITH BIOPSY N/A 02/23/2020   Procedure: CYSTOSCOPY WITH BIOPSY;  Surgeon: Abbie Sons, MD;  Location: [facility] ORS;  Service: Urology;  Laterality: N/A;   EYE SURGERY     HERNIA REPAIR     x 2   INGUINAL HERNIA REPAIR Right 12/02/2015  Procedure: HERNIA REPAIR INGUINAL ADULT;  Surgeon: Leonie Green, MD;  Location: [facility] ORS;  Service: General;  Laterality: Right;   PROSTATE SURGERY  2012   Right Carotid Endarterectomy     TOTAL HIP ARTHROPLASTY Right 08/01/2021   Procedure: TOTAL HIP ARTHROPLASTY;  Surgeon: Corky Mull, MD;  Location: [facility] ORS;  Service: Orthopedics;  Laterality: Right;    Home Medications:  Allergies as of 09/11/2022       Reactions   Oxycodone Other (See Comments)   Caused hallucinations/does not want anymore        Medication List        Accurate as of September 11, 2022  3:07 PM. If you have any questions, ask your nurse or doctor.          acetaminophen 500 MG tablet Commonly known as: TYLENOL Take 1-2 tablets (500-1,000 mg total) by mouth every 6 (six) hours as needed for mild pain.   amLODipine 10 MG tablet Commonly known as: NORVASC Take 1 tablet (10 mg total) by mouth daily.   aspirin EC 81 MG tablet Take 1 tablet (81 mg total) by mouth daily. Swallow whole.   cyanocobalamin 1000 MCG tablet Commonly known as: VITAMIN B12 Take by mouth.   iron polysaccharides 150 MG capsule Commonly known as: NIFEREX Take 150 mg by mouth 2 (two) times daily.   LORazepam 0.5 MG tablet Commonly known as: ATIVAN Take 0.5 mg by mouth daily as needed for anxiety.   metoprolol succinate 50 MG 24 hr tablet Commonly known as: TOPROL-XL Take 1 tablet (50 mg total) by mouth daily. Take with or immediately following a meal.   sulfamethoxazole-trimethoprim 800-160 MG tablet Commonly known as: BACTRIM DS Take 1 tablet by mouth 2 (two) times daily for 7 days.   Trelegy Ellipta 100-62.5-25 MCG/ACT Aepb Generic drug: Fluticasone-Umeclidin-Vilant Inhale 2 puffs into the lungs daily.        Allergies:  Allergies  Allergen Reactions   Oxycodone Other (See Comments)    Caused hallucinations/does not want anymore    Family History: Family History  Problem Relation Age of Onset   Cancer Brother        2000   Prostate cancer Neg Hx    Bladder Cancer Neg Hx    Kidney cancer Neg Hx     Social History:   reports that he quit smoking about 23 years ago. His smoking use included cigarettes. He has quit using smokeless tobacco. He reports that he does not currently use alcohol. He reports that he does not use drugs.  Physical Exam: BP 136/75   Ht 5\' 2"  (1.575 m)   Wt 168 lb (76.2 kg)   BMI 30.73 kg/m   Constitutional:  Alert and oriented, no acute distress, nontoxic appearing HEENT: [place], AT Cardiovascular: No clubbing, cyanosis, or  edema Respiratory: Normal respiratory effort, no increased work of breathing Skin: No rashes, bruises or suspicious lesions Neurologic: Grossly intact, no focal deficits, moving all 4 extremities Psychiatric: Normal mood and affect  Laboratory Data: Results for orders placed or performed in visit on 09/11/22  Microscopic Examination   Urine  Result Value Ref Range   WBC, UA >30 (A) 0 - 5 /hpf   RBC, Urine 3-10 (A) 0 - 2 /hpf   Epithelial Cells (non renal) 0-10 0 - 10 /hpf   Bacteria, UA Many (A) None seen/Few  Urinalysis, Complete  Result Value Ref Range   Specific Gravity, UA 1.015 1.005 - 1.030   pH,  UA 7.0 5.0 - 7.5   Color, UA Yellow Yellow   Appearance Ur Cloudy (A) Clear   Leukocytes,UA 3+ (A) Negative   Protein,UA 2+ (A) Negative/Trace   Glucose, UA Negative Negative   Ketones, UA Negative Negative   RBC, UA 1+ (A) Negative   Bilirubin, UA Negative Negative   Urobilinogen, Ur 0.2 0.2 - 1.0 mg/dL   Nitrite, UA Positive (A) Negative   Microscopic Examination See below:   BLADDER SCAN AMB NON-IMAGING  Result Value Ref Range   Scan Result 193 ml    Assessment & Plan:   1. Dysuria UA appears grossly infected today consistent with post cystoscopy UTI.  Will start empiric Bactrim and send for culture for further evaluation.  PVR slightly elevated but at baseline.  We discussed calling our clinic if his symptoms have not resolved by the last day of antibiotics, as I we will keep a low threshold to extend an additional 3 days in the light of his incomplete emptying.  They expressed understanding. - Urinalysis, Complete - BLADDER SCAN AMB NON-IMAGING - CULTURE, URINE COMPREHENSIVE - sulfamethoxazole-trimethoprim (BACTRIM DS) 800-160 MG tablet; Take 1 tablet by mouth 2 (two) times daily for 7 days.  Dispense: 14 tablet; Refill: 0   Return if symptoms worsen or fail to improve.  Debroah Loop, PA-C  [facility] [place] [address] [place], [place] [address] [phone]

## 2022-09-12 ENCOUNTER — Inpatient Hospital Stay (HOSPITAL_BASED_OUTPATIENT_CLINIC_OR_DEPARTMENT_OTHER): Payer: PPO | Admitting: Oncology

## 2022-09-12 ENCOUNTER — Encounter: Payer: Self-pay | Admitting: Oncology

## 2022-09-12 ENCOUNTER — Inpatient Hospital Stay: Payer: PPO

## 2022-09-12 VITALS — BP 152/79 | HR 82 | Temp 96.8°F | Resp 18 | Wt 168.9 lb

## 2022-09-12 VITALS — BP 120/69 | HR 69

## 2022-09-12 DIAGNOSIS — D631 Anemia in chronic kidney disease: Secondary | ICD-10-CM | POA: Diagnosis not present

## 2022-09-12 DIAGNOSIS — C61 Malignant neoplasm of prostate: Secondary | ICD-10-CM

## 2022-09-12 DIAGNOSIS — D500 Iron deficiency anemia secondary to blood loss (chronic): Secondary | ICD-10-CM

## 2022-09-12 DIAGNOSIS — N189 Chronic kidney disease, unspecified: Secondary | ICD-10-CM | POA: Diagnosis not present

## 2022-09-12 MED ORDER — SODIUM CHLORIDE 0.9 % IV SOLN
Freq: Once | INTRAVENOUS | Status: AC
  Filled 2022-09-12: qty 250

## 2022-09-12 MED ORDER — SODIUM CHLORIDE 0.9 % IV SOLN
200.0000 mg | Freq: Once | INTRAVENOUS | Status: AC
  Administered 2022-09-12: 200 mg via INTRAVENOUS
  Filled 2022-09-12: qty 200

## 2022-09-12 NOTE — Assessment & Plan Note (Signed)
He tolerates IV venofer treatments.  Iron saturation is 15, borderline low. Ferritin has improved. Hemoglobin is slightly decreased.  Recommend IV Venofer 200mg  x 1.

## 2022-09-12 NOTE — Assessment & Plan Note (Signed)
Previously treated with radiation.  PSA <0.01 in April 2023 Recommend patient to continue follow-up with urology and radiation oncology. 

## 2022-09-12 NOTE — Assessment & Plan Note (Signed)
negative multiple myeloma panel, light chain ratio. Venofer to further increase iron store.

## 2022-09-12 NOTE — Patient Instructions (Signed)

## 2022-09-12 NOTE — Progress Notes (Signed)
Hematology/Oncology Consult note [phone] [phone]    CHIEF COMPLAINTS/REASON FOR VISIT:  Iron deficiency Anemia  ASSESSMENT & PLAN:  Iron deficiency anemia due to chronic blood loss He tolerates IV venofer treatments.  Iron saturation is 15, borderline low. Ferritin has improved. Hemoglobin is slightly decreased.  Recommend IV Venofer 200mg  x 1.   Prostate cancer [facility]) Previously treated with radiation.  PSA <0.01 in April 2023 Recommend patient to continue follow-up with urology and radiation oncology.  Anemia due to chronic kidney disease negative multiple myeloma panel, light chain ratio. Venofer to further increase iron store.   Patient declines [place] interpreter service  Orders Placed This Encounter  Procedures   CBC with Differential ([place] Only)    Standing Status:   Future    Standing Expiration Date:   09/12/2023   Iron and TIBC    Standing Status:   Future    Standing Expiration Date:   09/12/2023   Ferritin    Standing Status:   Future    Standing Expiration Date:   09/12/2023   Retic Panel    Standing Status:   Future    Standing Expiration Date:   09/12/2023    Follow-up in 6 months All questions were answered. The patient knows to call the clinic with any problems, questions or concerns.  Earlie Server, MD, PhD [facility] Hematology Oncology 09/12/2022     HISTORY OF PRESENTING ILLNESS:  Ruben Green is a  89 y.o.  male with PMH listed below who was referred to me for anemia Reviewed patient's recent labs that was done.  He was found to have abnormal CBC on 01/16/2022, hemoglobin 8.8, MCV 88.3, Reviewed patient's previous labs ordered by primary care physician's office, anemia is chronic onset, gradually worsening.  Patient has chronic kidney disease. He denies recent chest pain on exertion,  pre-syncopal episodes, or palpitations He had not noticed any recent bleeding such as epistaxis, hematuria  He denies over  the counter NSAID ingestion. His last colonoscopy was  He denies any pica and eats a variety of diet. Chronic shortness of breath with exertion due to COPD. Patient was accompanied by wife.   Patient has history of prostate cancer that was treated with radiation in 2017 by Dr. Baruch Gouty.  Patient has had proctitis and intermittently he has blood in the stool.  INTERVAL HISTORY Ruben Green is a 89 y.o. male who has above history reviewed by me today presents for follow up visit for iron deficiency anemia Patient speaks [place].  He had a hard hearing.  History was obtained primarily from wife.  Patient declines [place] interpreter service Fatigue is much better. Accompanied by wife Ruben Green.  MEDICAL HISTORY:  Past Medical History:  Diagnosis Date   AAA (abdominal aortic aneurysm) ([place])    a.) s/p repair in 2005   Anemia    Anginal pain ([place])    Anxiety    Arthritis    B12 deficiency    Bilateral cataracts    a.) s/p BILATERAL extractions in 2018   BPH with obstruction/lower urinary tract symptoms    CAD (coronary artery disease)    Carotid artery stenosis    a.) s/p CEA on the RIGHT   CHF (congestive heart failure) (HCC)    COPD (chronic obstructive pulmonary disease) ([place])    Diastolic dysfunction [id number]   a.)  TTE 07/29/2019: EF 50-55%; LA mildly enlarged; G1DD.   DOE (dyspnea on exertion)    Elevated PSA  Environmental and seasonal allergies    History of 2019 novel coronavirus disease (COVID-19)    History of kidney stones    HLD (hyperlipidemia)    HOH (hard of hearing)    HTN (hypertension)    Incomplete bladder emptying    Macular degeneration    Prostate cancer (HCC)    RBBB (right bundle branch block)    Valvular insufficiency    a.) TTE 07/29/2019: LVEF 50-55%; LA mild enlarged; triv AR/PR, mild MR, mod TR.    SURGICAL HISTORY: Past Surgical History:  Procedure Laterality Date   ABDOMINAL AORTIC ANEURYSM REPAIR     CATARACT EXTRACTION W/PHACO Right  09/25/2016   Procedure: CATARACT EXTRACTION PHACO AND INTRAOCULAR LENS PLACEMENT (IOC);  Surgeon: Birder Robson, MD;  Location: [facility] ORS;  Service: Ophthalmology;  Laterality: Right;  [place] 01:01 AP% 17.4 CDE 10.75 Fluid pack lot # [id number]   CATARACT EXTRACTION W/PHACO Left 10/16/2016   Procedure: CATARACT EXTRACTION PHACO AND INTRAOCULAR LENS PLACEMENT ([place]) Suture placed in Left eye;  Surgeon: Birder Robson, MD;  Location: [facility] ORS;  Service: Ophthalmology;  Laterality: Left;  [place] 2:06.8 AP% 22.2 CDE 28.17 Fluid pack lot # [id number]   COLONOSCOPY WITH PROPOFOL N/A 03/31/2019   Procedure: COLONOSCOPY WITH PROPOFOL;  Surgeon: Lollie Sails, MD;  Location: [facility] ENDOSCOPY;  Service: Endoscopy;  Laterality: N/A;   COLONOSCOPY WITH PROPOFOL N/A 09/09/2020   Procedure: COLONOSCOPY WITH PROPOFOL;  Surgeon: Lesly Rubenstein, MD;  Location: [facility] ENDOSCOPY;  Service: Endoscopy;  Laterality: N/A;   CYSTOSCOPY W/ RETROGRADES Bilateral 02/23/2020   Procedure: CYSTOSCOPY WITH RETROGRADE PYELOGRAM;  Surgeon: Abbie Sons, MD;  Location: [facility] ORS;  Service: Urology;  Laterality: Bilateral;   CYSTOSCOPY WITH BIOPSY N/A 02/23/2020   Procedure: CYSTOSCOPY WITH BIOPSY;  Surgeon: Abbie Sons, MD;  Location: [facility] ORS;  Service: Urology;  Laterality: N/A;   EYE SURGERY     HERNIA REPAIR     x 2   INGUINAL HERNIA REPAIR Right 12/02/2015   Procedure: HERNIA REPAIR INGUINAL ADULT;  Surgeon: Leonie Green, MD;  Location: [facility] ORS;  Service: General;  Laterality: Right;   PROSTATE SURGERY  2012   Right Carotid Endarterectomy     TOTAL HIP ARTHROPLASTY Right 08/01/2021   Procedure: TOTAL HIP ARTHROPLASTY;  Surgeon: Corky Mull, MD;  Location: [facility] ORS;  Service: Orthopedics;  Laterality: Right;    SOCIAL HISTORY: Social History   Socioeconomic History   Marital status: Married    Spouse name: Not on file   Number of children: Not on file   Years of education: Not on file   Highest education  level: Not on file  Occupational History   Not on file  Tobacco Use   Smoking status: Former    Types: Cigarettes    Quit date: 11/30/1998    Years since quitting: 23.8   Smokeless tobacco: Former   Tobacco comments:    quit 10 years ago  Vaping Use   Vaping Use: Never used  Substance and Sexual Activity   Alcohol use: Not Currently    Comment: occasional   Drug use: No   Sexual activity: Not on file  Other Topics Concern   Not on file  Social History Narrative   Not on file   Social Determinants of Health   Financial Resource Strain: Not on file  Food Insecurity: Not on file  Transportation Needs: Not on file  Physical Activity: Not on file  Stress: Not on file  Social Connections:  Not on file  Intimate Partner Violence: Not on file    FAMILY HISTORY: Family History  Problem Relation Age of Onset   Cancer Brother        2000   Prostate cancer Neg Hx    Bladder Cancer Neg Hx    Kidney cancer Neg Hx     ALLERGIES:  is allergic to oxycodone.  MEDICATIONS:  Current Outpatient Medications  Medication Sig Dispense Refill   acetaminophen (TYLENOL) 500 MG tablet Take 1-2 tablets (500-1,000 mg total) by mouth every 6 (six) hours as needed for mild pain. 60 tablet 0   amLODipine (NORVASC) 10 MG tablet Take 1 tablet (10 mg total) by mouth daily. 30 tablet 0   aspirin EC 81 MG tablet Take 1 tablet (81 mg total) by mouth daily. Swallow whole. 30 tablet    cyanocobalamin (VITAMIN B12) 1000 MCG tablet Take by mouth.     Fluticasone-Umeclidin-Vilant (TRELEGY ELLIPTA) 100-62.5-25 MCG/INH AEPB Inhale 2 puffs into the lungs daily.     iron polysaccharides (NIFEREX) 150 MG capsule Take 150 mg by mouth 2 (two) times daily.     LORazepam (ATIVAN) 0.5 MG tablet Take 0.5 mg by mouth daily as needed for anxiety.     metoprolol succinate (TOPROL-XL) 50 MG 24 hr tablet Take 1 tablet (50 mg total) by mouth daily. Take with or immediately following a meal. 30 tablet 0    sulfamethoxazole-trimethoprim (BACTRIM DS) 800-160 MG tablet Take 1 tablet by mouth 2 (two) times daily for 7 days. 14 tablet 0   No current facility-administered medications for this visit.    Review of Systems  Constitutional:  Positive for fatigue. Negative for chills and fever.  HENT:   Positive for hearing loss. Negative for voice change.   Eyes:  Negative for eye problems and icterus.  Respiratory:  Positive for shortness of breath. Negative for chest tightness and cough.   Cardiovascular:  Negative for chest pain and leg swelling.  Gastrointestinal:  Negative for abdominal distention and abdominal pain.  Endocrine: Negative for hot flashes.  Genitourinary:  Negative for difficulty urinating, dysuria and frequency.   Musculoskeletal:  Negative for arthralgias.  Skin:  Negative for itching and rash.  Neurological:  Negative for light-headedness and numbness.  Hematological:  Negative for adenopathy. Does not bruise/bleed easily.  Psychiatric/Behavioral:  Negative for confusion.     PHYSICAL EXAMINATION: ECOG PERFORMANCE STATUS: 1 - Symptomatic but completely ambulatory Vitals:   09/12/22 1451  BP: (!) 152/79  Pulse: 82  Resp: 18  Temp: (!) 96.8 F (36 C)  SpO2: 97%   Filed Weights   09/12/22 1451  Weight: 168 lb 14.4 oz (76.6 kg)    Physical Exam Constitutional:      General: He is not in acute distress. HENT:     Head: Normocephalic and atraumatic.  Eyes:     General: No scleral icterus. Cardiovascular:     Rate and Rhythm: Normal rate and regular rhythm.     Heart sounds: Normal heart sounds.  Pulmonary:     Effort: Pulmonary effort is normal. No respiratory distress.     Breath sounds: No wheezing.     Comments: Significantly decreased breath sound bilaterally Abdominal:     General: Bowel sounds are normal. There is no distension.     Palpations: Abdomen is soft.  Musculoskeletal:        General: No deformity. Normal range of motion.     Cervical back:  Normal range of motion and neck  supple.  Skin:    General: Skin is warm and dry.     Findings: No erythema or rash.  Neurological:     Mental Status: He is alert and oriented to person, place, and time. Mental status is at baseline.     Cranial Nerves: No cranial nerve deficit.     Coordination: Coordination normal.  Psychiatric:        Mood and Affect: Mood normal.      LABORATORY DATA:  I have reviewed the data as listed    Latest Ref Rng & Units 09/10/2022    3:29 PM 05/08/2022    3:14 PM 02/08/2022   10:21 AM  CBC  WBC 4.0 - 10.5 K/uL 7.3  6.6  10.7   Hemoglobin 13.0 - 17.0 g/dL 11.8  12.1  10.3   Hematocrit 39.0 - 52.0 % 37.8  38.3  34.5   Platelets 150 - 400 K/uL 202  166  220       Latest Ref Rng & Units 12/28/2021   12:59 PM 12/28/2021    4:58 AM 12/27/2021    4:34 AM  CMP  Glucose 70 - 99 mg/dL  103  168   BUN 8 - 23 mg/dL  53  60   Creatinine 0.61 - 1.24 mg/dL  1.35  1.51   Sodium 135 - 145 mmol/L  136  136   Potassium 3.5 - 5.1 mmol/L 4.9  5.3  4.8   Chloride 98 - 111 mmol/L  106  107   CO2 22 - 32 mmol/L  24  22   Calcium 8.9 - 10.3 mg/dL  8.8  8.6       Component Value Date/Time   IRON 49 09/10/2022 1529   TIBC 318 09/10/2022 1529   FERRITIN 123 09/10/2022 1529   IRONPCTSAT 15 (L) 09/10/2022 1529     RADIOGRAPHIC STUDIES: I have personally reviewed the radiological images as listed and agreed with the findings in the report. No results found.

## 2022-09-14 LAB — CULTURE, URINE COMPREHENSIVE

## 2022-09-17 ENCOUNTER — Other Ambulatory Visit: Payer: Self-pay | Admitting: *Deleted

## 2022-09-17 ENCOUNTER — Telehealth: Payer: Self-pay | Admitting: Physician Assistant

## 2022-09-17 MED ORDER — CEPHALEXIN 500 MG PO CAPS: 500.0000 mg | ORAL_CAPSULE | Freq: Two times a day (BID) | ORAL | 0 refills | Status: AC

## 2022-09-17 NOTE — Telephone Encounter (Signed)
Responded, see results note.

## 2022-09-17 NOTE — Telephone Encounter (Signed)
Pt wife called. Pt is not feeling good, keeps going to the bathroom, after taking the antibiotics given last week. Please call his wife Posey Pronto at [phone] after 9:30 am.

## 2022-09-26 DIAGNOSIS — I6523 Occlusion and stenosis of bilateral carotid arteries: Secondary | ICD-10-CM | POA: Diagnosis not present

## 2022-10-01 DIAGNOSIS — Z961 Presence of intraocular lens: Secondary | ICD-10-CM | POA: Diagnosis not present

## 2022-10-01 DIAGNOSIS — H353212 Exudative age-related macular degeneration, right eye, with inactive choroidal neovascularization: Secondary | ICD-10-CM | POA: Diagnosis not present

## 2022-10-01 DIAGNOSIS — Z8669 Personal history of other diseases of the nervous system and sense organs: Secondary | ICD-10-CM | POA: Diagnosis not present

## 2022-10-01 DIAGNOSIS — H353221 Exudative age-related macular degeneration, left eye, with active choroidal neovascularization: Secondary | ICD-10-CM | POA: Diagnosis not present

## 2022-10-04 DIAGNOSIS — L578 Other skin changes due to chronic exposure to nonionizing radiation: Secondary | ICD-10-CM | POA: Diagnosis not present

## 2022-10-04 DIAGNOSIS — L821 Other seborrheic keratosis: Secondary | ICD-10-CM | POA: Diagnosis not present

## 2022-10-05 DIAGNOSIS — H44001 Unspecified purulent endophthalmitis, right eye: Secondary | ICD-10-CM | POA: Diagnosis not present

## 2022-10-05 DIAGNOSIS — D090 Carcinoma in situ of bladder: Secondary | ICD-10-CM | POA: Diagnosis not present

## 2022-10-05 DIAGNOSIS — Z96641 Presence of right artificial hip joint: Secondary | ICD-10-CM | POA: Diagnosis not present

## 2022-10-05 DIAGNOSIS — C61 Malignant neoplasm of prostate: Secondary | ICD-10-CM | POA: Diagnosis not present

## 2022-11-02 DIAGNOSIS — H353221 Exudative age-related macular degeneration, left eye, with active choroidal neovascularization: Secondary | ICD-10-CM | POA: Diagnosis not present

## 2022-11-04 DIAGNOSIS — H44001 Unspecified purulent endophthalmitis, right eye: Secondary | ICD-10-CM | POA: Diagnosis not present

## 2022-11-04 DIAGNOSIS — Z96641 Presence of right artificial hip joint: Secondary | ICD-10-CM | POA: Diagnosis not present

## 2022-11-04 DIAGNOSIS — D090 Carcinoma in situ of bladder: Secondary | ICD-10-CM | POA: Diagnosis not present

## 2022-11-04 DIAGNOSIS — C61 Malignant neoplasm of prostate: Secondary | ICD-10-CM | POA: Diagnosis not present

## 2022-11-29 ENCOUNTER — Ambulatory Visit: Payer: PPO | Admitting: Physician Assistant

## 2022-11-29 VITALS — BP 136/66 | HR 68

## 2022-11-29 DIAGNOSIS — R300 Dysuria: Secondary | ICD-10-CM

## 2022-11-29 LAB — MICROSCOPIC EXAMINATION: WBC, UA: >30 /hpf — AB (ref 0–5)

## 2022-11-29 LAB — URINALYSIS, COMPLETE
Bilirubin, UA: NEGATIVE
Glucose, UA: NEGATIVE
Ketones, UA: NEGATIVE
Nitrite, UA: NEGATIVE
Specific Gravity, UA: 1.01 (ref 1.005–1.030)
Urobilinogen, Ur: 0.2 mg/dL (ref 0.2–1.0)
pH, UA: 5.5 (ref 5.0–7.5)

## 2022-11-29 LAB — BLADDER SCAN AMB NON-IMAGING: Scan Result: 166 ml

## 2022-11-29 MED ORDER — CEPHALEXIN 500 MG PO CAPS: 500.0000 mg | ORAL_CAPSULE | Freq: Two times a day (BID) | ORAL | 0 refills | Status: DC

## 2022-11-29 NOTE — Progress Notes (Signed)
11/29/2022 4:38 PM   Ruben Green April 27, 1934 [id number]  CC: Chief Complaint  Patient presents with   Cystitis   HPI: Ruben Green is a 89 y.o. 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   Today he reports 1 week of dysuria and frequency without fever, chills, nausea, or vomiting.  They wonder why he has been getting UTIs, with his most recent infection occurring about 3 months ago.  In-office UA today positive for trace intact blood, trace protein, and 3+ leukocytes; urine microscopy with >30 WBCs/HPF and moderate bacteria. PVR .  PMH: Past Medical History:  Diagnosis Date   AAA (abdominal aortic aneurysm) (HCC)    a.) s/p repair in 2005   Anemia    Anginal pain (HCC)    Anxiety    Arthritis    B12 deficiency    Bilateral cataracts    a.) s/p BILATERAL extractions in 2018   BPH with obstruction/lower urinary tract symptoms    CAD (coronary artery disease)    Carotid artery stenosis    a.) s/p CEA on the RIGHT   CHF (congestive heart failure) (HCC)    COPD (chronic obstructive pulmonary disease) (HCC)    Diastolic dysfunction 07/29/2019   a.)  TTE 07/29/2019: EF 50-55%; LA mildly enlarged; G1DD.   DOE (dyspnea on exertion)    Elevated PSA    Environmental and seasonal allergies    History of 2019 novel coronavirus disease (COVID-19)    History of kidney stones    HLD (hyperlipidemia)    HOH (hard of hearing)    HTN (hypertension)    Incomplete bladder emptying    Macular degeneration    Prostate cancer (HCC)    RBBB (right bundle branch block)    Valvular insufficiency    a.) TTE 07/29/2019: LVEF 50-55%; LA mild enlarged; triv AR/PR, mild MR, mod TR.    Surgical History: Past Surgical History:  Procedure Laterality Date   ABDOMINAL AORTIC ANEURYSM REPAIR     CATARACT EXTRACTION W/PHACO Right 09/25/2016   Procedure: CATARACT EXTRACTION PHACO  AND INTRAOCULAR LENS PLACEMENT (IOC);  Surgeon: Galen Manila, MD;  Location: [facility];  Service: Ophthalmology;  Laterality: Right;  [place] 01:01 AP% 17.4 CDE 10.75 Fluid pack lot # [id number]   CATARACT EXTRACTION W/PHACO Left 10/16/2016   Procedure: CATARACT EXTRACTION PHACO AND INTRAOCULAR LENS PLACEMENT (IOC) Suture placed in Left eye;  Surgeon: Galen Manila, MD;  Location: [facility];  Service: Ophthalmology;  Laterality: Left;  [place] 2:06.8 AP% 22.2 CDE 28.17 Fluid pack lot # [id number]   COLONOSCOPY WITH PROPOFOL N/A 03/31/2019   Procedure: COLONOSCOPY WITH PROPOFOL;  Surgeon: Christena Deem, MD;  Location: [facility];  Service: Endoscopy;  Laterality: N/A;   COLONOSCOPY WITH PROPOFOL N/A 09/09/2020   Procedure: COLONOSCOPY WITH PROPOFOL;  Surgeon: Regis Bill, MD;  Location: [facility] ENDOSCOPY;  Service: Endoscopy;  Laterality: N/A;   CYSTOSCOPY W/ RETROGRADES Bilateral 02/23/2020   Procedure: CYSTOSCOPY WITH RETROGRADE PYELOGRAM;  Surgeon: Riki Altes, MD;  Location: [facility];  Service: Urology;  Laterality: Bilateral;   CYSTOSCOPY WITH BIOPSY N/A 02/23/2020   Procedure: CYSTOSCOPY WITH BIOPSY;  Surgeon: Riki Altes, MD;  Location: [facility];  Service: Urology;  Laterality: N/A;   EYE SURGERY     HERNIA REPAIR     x 2   INGUINAL HERNIA REPAIR Right 12/02/2015   Procedure: HERNIA REPAIR INGUINAL ADULT;  Surgeon: Nadeen Landau, MD;  Location: [facility];  Service: General;  Laterality: Right;   PROSTATE SURGERY  2012   Right Carotid Endarterectomy     TOTAL HIP ARTHROPLASTY Right 08/01/2021   Procedure: TOTAL HIP ARTHROPLASTY;  Surgeon: Christena Flake, MD;  Location: [facility];  Service: Orthopedics;  Laterality: Right;    Home Medications:  Allergies as of 11/29/2022       Reactions   Oxycodone Other (See Comments)   Caused hallucinations/does not want anymore        Medication List        Accurate as of November 29, 2022  4:38 PM. If you have any questions, ask  your nurse or doctor.          acetaminophen 500 MG tablet Commonly known as: TYLENOL Take 1-2 tablets (500-1,000 mg total) by mouth every 6 (six) hours as needed for mild pain.   amLODipine 10 MG tablet Commonly known as: NORVASC Take 1 tablet (10 mg total) by mouth daily.   aspirin EC 81 MG tablet Take 1 tablet (81 mg total) by mouth daily. Swallow whole.   cephALEXin 500 MG capsule Commonly known as: Keflex Take 1 capsule (500 mg total) by mouth 2 (two) times daily for 7 days.   cyanocobalamin 1000 MCG tablet Commonly known as: VITAMIN B12 Take by mouth.   iron polysaccharides 150 MG capsule Commonly known as: NIFEREX Take 150 mg by mouth 2 (two) times daily.   LORazepam 0.5 MG tablet Commonly known as: ATIVAN Take 0.5 mg by mouth daily as needed for anxiety.   metoprolol succinate 50 MG 24 hr tablet Commonly known as: TOPROL-XL Take 1 tablet (50 mg total) by mouth daily. Take with or immediately following a meal.   Trelegy Ellipta 100-62.5-25 MCG/ACT Aepb Generic drug: Fluticasone-Umeclidin-Vilant Inhale 2 puffs into the lungs daily.        Allergies:  Allergies  Allergen Reactions   Oxycodone Other (See Comments)    Caused hallucinations/does not want anymore    Family History: Family History  Problem Relation Age of Onset   Cancer Brother        2000   Prostate cancer Neg Hx    Bladder Cancer Neg Hx    Kidney cancer Neg Hx     Social History:   reports that he quit smoking about 24 years ago. His smoking use included cigarettes. He has quit using smokeless tobacco. He reports that he does not currently use alcohol. He reports that he does not use drugs.  Physical Exam: BP 136/66   Pulse 68   Constitutional:  Alert and oriented, no acute distress, nontoxic appearing HEENT: [place], AT Cardiovascular: No clubbing, cyanosis, or edema Respiratory: Normal respiratory effort, no increased work of breathing Skin: No rashes, bruises or suspicious  lesions Neurologic: Grossly intact, no focal deficits, moving all 4 extremities Psychiatric: Normal mood and affect  Laboratory Data: Results for orders placed or performed in visit on 11/29/22  Microscopic Examination   Urine  Result Value Ref Range   WBC, UA >30 (A) 0 - 5 /hpf   RBC, Urine 0-2 0 - 2 /hpf   Epithelial Cells (non renal) 0-10 0 - 10 /hpf   Bacteria, UA Moderate (A) None seen/Few  Urinalysis, Complete  Result Value Ref Range   Specific Gravity, UA 1.010 1.005 - 1.030   pH, UA 5.5 5.0 - 7.5   Color, UA Yellow Yellow   Appearance Ur Hazy (A) Clear  Leukocytes,UA 3+ (A) Negative   Protein,UA Trace (A) Negative/Trace   Glucose, UA Negative Negative   Ketones, UA Negative Negative   RBC, UA Trace (A) Negative   Bilirubin, UA Negative Negative   Urobilinogen, Ur 0.2 0.2 - 1.0 mg/dL   Nitrite, UA Negative Negative   Microscopic Examination See below:   BLADDER SCAN AMB NON-IMAGING  Result Value Ref Range   Scan Result 166 ml   Assessment & Plan:   1. Dysuria 1 week of dysuria and frequency with grossly infected appearing urine.  Will start empiric Keflex and send for culture for further evaluation.  We discussed that his incomplete bladder emptying is likely playing a role in his UTIs.  Notably, he now meets diagnostic criteria for recurrent UTIs. - Urinalysis, Complete - BLADDER SCAN AMB NON-IMAGING - CULTURE, URINE COMPREHENSIVE - cephALEXin (KEFLEX) 500 MG capsule; Take 1 capsule (500 mg total) by mouth 2 (two) times daily for 7 days.  Dispense: 14 capsule; Refill: 0   Return if symptoms worsen or fail to improve.  Carman Ching, PA-C  [facility] [place] [address], [address] [place], [place] [address] [phone]

## 2022-12-04 DIAGNOSIS — H353221 Exudative age-related macular degeneration, left eye, with active choroidal neovascularization: Secondary | ICD-10-CM | POA: Diagnosis not present

## 2022-12-04 LAB — CULTURE, URINE COMPREHENSIVE

## 2022-12-05 ENCOUNTER — Encounter: Payer: Self-pay | Admitting: Emergency Medicine

## 2022-12-05 ENCOUNTER — Inpatient Hospital Stay
Admission: EM | Admit: 2022-12-05 | Discharge: 2022-12-07 | DRG: 389 | Disposition: A | Discharge status: Home Health | Payer: PPO | Attending: Internal Medicine | Admitting: Internal Medicine

## 2022-12-05 DIAGNOSIS — I2510 Atherosclerotic heart disease of native coronary artery without angina pectoris: Secondary | ICD-10-CM | POA: Diagnosis present

## 2022-12-05 DIAGNOSIS — I130 Hypertensive heart and chronic kidney disease with heart failure and stage 1 through stage 4 chronic kidney disease, or unspecified chronic kidney disease: Secondary | ICD-10-CM | POA: Diagnosis present

## 2022-12-05 DIAGNOSIS — E785 Hyperlipidemia, unspecified: Secondary | ICD-10-CM | POA: Diagnosis present

## 2022-12-05 DIAGNOSIS — Z96641 Presence of right artificial hip joint: Secondary | ICD-10-CM | POA: Diagnosis not present

## 2022-12-05 DIAGNOSIS — H3530 Unspecified macular degeneration: Secondary | ICD-10-CM | POA: Diagnosis present

## 2022-12-05 DIAGNOSIS — J449 Chronic obstructive pulmonary disease, unspecified: Secondary | ICD-10-CM | POA: Diagnosis present

## 2022-12-05 DIAGNOSIS — D090 Carcinoma in situ of bladder: Secondary | ICD-10-CM | POA: Diagnosis present

## 2022-12-05 DIAGNOSIS — H44001 Unspecified purulent endophthalmitis, right eye: Secondary | ICD-10-CM | POA: Diagnosis not present

## 2022-12-05 DIAGNOSIS — K56609 Unspecified intestinal obstruction, unspecified as to partial versus complete obstruction: Secondary | ICD-10-CM | POA: Diagnosis not present

## 2022-12-05 DIAGNOSIS — Z79899 Other long term (current) drug therapy: Secondary | ICD-10-CM

## 2022-12-05 DIAGNOSIS — N401 Enlarged prostate with lower urinary tract symptoms: Secondary | ICD-10-CM | POA: Diagnosis present

## 2022-12-05 DIAGNOSIS — Z9889 Other specified postprocedural states: Secondary | ICD-10-CM

## 2022-12-05 DIAGNOSIS — F419 Anxiety disorder, unspecified: Secondary | ICD-10-CM | POA: Diagnosis present

## 2022-12-05 DIAGNOSIS — E871 Hypo-osmolality and hyponatremia: Secondary | ICD-10-CM | POA: Diagnosis present

## 2022-12-05 DIAGNOSIS — Z8546 Personal history of malignant neoplasm of prostate: Secondary | ICD-10-CM

## 2022-12-05 DIAGNOSIS — Z885 Allergy status to narcotic agent status: Secondary | ICD-10-CM

## 2022-12-05 DIAGNOSIS — Z923 Personal history of irradiation: Secondary | ICD-10-CM

## 2022-12-05 DIAGNOSIS — Z7982 Long term (current) use of aspirin: Secondary | ICD-10-CM

## 2022-12-05 DIAGNOSIS — N1831 Chronic kidney disease, stage 3a: Secondary | ICD-10-CM | POA: Diagnosis present

## 2022-12-05 DIAGNOSIS — I5033 Acute on chronic diastolic (congestive) heart failure: Secondary | ICD-10-CM | POA: Diagnosis present

## 2022-12-05 DIAGNOSIS — I5032 Chronic diastolic (congestive) heart failure: Secondary | ICD-10-CM | POA: Diagnosis present

## 2022-12-05 DIAGNOSIS — N390 Urinary tract infection, site not specified: Secondary | ICD-10-CM | POA: Diagnosis present

## 2022-12-05 DIAGNOSIS — I4510 Unspecified right bundle-branch block: Secondary | ICD-10-CM | POA: Diagnosis present

## 2022-12-05 DIAGNOSIS — N179 Acute kidney failure, unspecified: Secondary | ICD-10-CM | POA: Diagnosis present

## 2022-12-05 DIAGNOSIS — C61 Malignant neoplasm of prostate: Secondary | ICD-10-CM | POA: Diagnosis not present

## 2022-12-05 DIAGNOSIS — Z8616 Personal history of COVID-19: Secondary | ICD-10-CM

## 2022-12-05 DIAGNOSIS — K439 Ventral hernia without obstruction or gangrene: Secondary | ICD-10-CM | POA: Insufficient documentation

## 2022-12-05 DIAGNOSIS — N400 Enlarged prostate without lower urinary tract symptoms: Secondary | ICD-10-CM | POA: Insufficient documentation

## 2022-12-05 DIAGNOSIS — Z87891 Personal history of nicotine dependence: Secondary | ICD-10-CM

## 2022-12-05 DIAGNOSIS — B964 Proteus (mirabilis) (morganii) as the cause of diseases classified elsewhere: Secondary | ICD-10-CM | POA: Diagnosis present

## 2022-12-05 DIAGNOSIS — D72829 Elevated white blood cell count, unspecified: Secondary | ICD-10-CM

## 2022-12-05 DIAGNOSIS — R000 Tachycardia, unspecified: Secondary | ICD-10-CM | POA: Diagnosis not present

## 2022-12-05 DIAGNOSIS — Z8679 Personal history of other diseases of the circulatory system: Secondary | ICD-10-CM

## 2022-12-05 DIAGNOSIS — I10 Essential (primary) hypertension: Secondary | ICD-10-CM | POA: Diagnosis present

## 2022-12-05 DIAGNOSIS — Z87442 Personal history of urinary calculi: Secondary | ICD-10-CM

## 2022-12-05 LAB — CBC
HCT: 43.9 % (ref 39.0–52.0)
Hemoglobin: 13.9 g/dL (ref 13.0–17.0)
MCH: 30.8 pg (ref 26.0–34.0)
MCHC: 31.7 g/dL (ref 30.0–36.0)
MCV: 97.1 fL (ref 80.0–100.0)
Platelets: 231 10*3/uL (ref 150–400)
RBC: 4.52 MIL/uL (ref 4.22–5.81)
RDW: 12.5 % (ref 11.5–15.5)
WBC: 13.9 10*3/uL — ABNORMAL HIGH (ref 4.0–10.5)
nRBC: 0 % (ref 0.0–0.2)

## 2022-12-05 MED ORDER — LACTATED RINGERS IV BOLUS
1000.0000 mL | Freq: Once | INTRAVENOUS | Status: AC
  Administered 2022-12-06: 1000 mL via INTRAVENOUS

## 2022-12-05 MED ORDER — FENTANYL CITRATE PF 50 MCG/ML IJ SOSY
50.0000 ug | PREFILLED_SYRINGE | Freq: Once | INTRAMUSCULAR | Status: AC
  Administered 2022-12-06: 50 ug via INTRAVENOUS
  Filled 2022-12-05: qty 1

## 2022-12-05 MED ORDER — ONDANSETRON HCL 4 MG/2ML IJ SOLN
4.0000 mg | Freq: Once | INTRAMUSCULAR | Status: AC
  Administered 2022-12-06: 4 mg via INTRAVENOUS
  Filled 2022-12-05: qty 2

## 2022-12-05 NOTE — ED Provider Notes (Incomplete)
   [facility] Provider Note    Event Date/Time   First MD Initiated Contact with Patient 12/05/22 2330     (approximate)   History   Abdominal Pain   HPI  Ruben Green is a 89 y.o. male who presents to the ED for evaluation of Abdominal Pain   I review annual physical exam at PCP past January.  History of prostate cancer s/p radiation, 45-pack-year smoke history and COPD, ASA 81 is only thinner.  History of iron deficiency anemia.  BPH and was seen at urology 6 days ago when complaining of dysuria and frequency.  Discharged on week of Keflex, and urine culture results with Proteus susceptible to cephalosporins.  Patient presents to the ED with his wife for evaluation of lower abdominal pain, constipation, bloating and nausea vomiting.  Patient has had a remote open AAA repair many years ago, but denies any other abdominal surgeries.  Physical Exam   Triage Vital Signs: ED Triage Vitals  Enc Vitals Group     BP 12/05/22 2322 (!) 146/77     Pulse Rate 12/05/22 2322 (!) 58     Resp 12/05/22 2322 (!) 21     Temp 12/05/22 2322 98.2 F (36.8 C)     Temp Source 12/05/22 2322 Oral     SpO2 12/05/22 2322 97 %     Weight 12/05/22 2324 168 lb 10.4 oz (76.5 kg)     Height 12/05/22 2324 5\' 2"  (1.575 m)     Head Circumference --      Peak Flow --      Pain Score 12/05/22 2324 9     Pain Loc --      Pain Edu? --      Excl. in GC? --     Most recent vital signs: Vitals:   12/05/22 2322  BP: (!) 146/77  Pulse: (!) 58  Resp: (!) 21  Temp: 98.2 F (36.8 C)  SpO2: 97%    General: Awake, no distress. *** CV:  Good peripheral perfusion.  Resp:  Normal effort.  Abd:  No distention.  MSK:  No deformity noted.  Neuro:  No focal deficits appreciated. Other:     ED Results / Procedures / Treatments   Labs (all labs ordered are listed, but only abnormal results are displayed) Labs Reviewed  LIPASE, BLOOD  COMPREHENSIVE METABOLIC PANEL  CBC   URINALYSIS, ROUTINE W REFLEX MICROSCOPIC  TROPONIN I (HIGH SENSITIVITY)    EKG Sinus tachycardia with a rate of 108 bpm.  Rightward axis and right bundle.  No STEMI.  Couple PVCs  RADIOLOGY ***  Official radiology report(s): No results found.  PROCEDURES and INTERVENTIONS:  Procedures  Medications - No data to display   IMPRESSION / MDM / ASSESSMENT AND PLAN / ED COURSE  I reviewed the triage vital signs and the nursing notes.  Differential diagnosis includes, but is not limited to, ***  {Patient presents with symptoms of an acute illness or injury that is potentially life-threatening.}      FINAL CLINICAL IMPRESSION(S) / ED DIAGNOSES   Final diagnoses:  None     Rx / DC Orders   ED Discharge Orders     None        Note:  This document was prepared using Dragon voice recognition software and may include unintentional dictation errors.

## 2022-12-05 NOTE — ED Triage Notes (Signed)
Pt with spouse who reports over the last 3 days, pt has had constipation, bloating, N/V with coffee ground emesis today. Pt reports dark stool prior but sts he is on Iron. Pt c/o generalized abdominal pain with 4-9 out of 10 pain. Hx/o AAA with repair, CHF, COPD, Hypertension.

## 2022-12-05 NOTE — ED Provider Notes (Signed)
[facility] Provider Note    Event Date/Time   First MD Initiated Contact with Patient 12/05/22 2330     (approximate)   History   Abdominal Pain   HPI  Ruben Green is a 89 y.o. male who presents to the ED for evaluation of Abdominal Pain   I review annual physical exam at PCP past January.  History of prostate cancer s/p radiation, 45-pack-year smoke history and COPD, ASA 81 is only thinner.  History of iron deficiency anemia.  BPH and was seen at urology 6 days ago when complaining of dysuria and frequency.  Discharged on week of Keflex, and urine culture results with Proteus susceptible to cephalosporins.  Patient presents to the ED with his wife for evaluation of lower abdominal pain, constipation, bloating and nausea vomiting.  Patient has had a remote open AAA repair many years ago, but denies any other abdominal surgeries.  Physical Exam   Triage Vital Signs: ED Triage Vitals  Enc Vitals Group     BP 12/05/22 2322 (!) 146/77     Pulse Rate 12/05/22 2322 (!) 58     Resp 12/05/22 2322 (!) 21     Temp 12/05/22 2322 98.2 F (36.8 C)     Temp Source 12/05/22 2322 Oral     SpO2 12/05/22 2322 97 %     Weight 12/05/22 2324 168 lb 10.4 oz (76.5 kg)     Height 12/05/22 2324 5\' 2"  (1.575 m)     Head Circumference --      Peak Flow --      Pain Score 12/05/22 2324 9     Pain Loc --      Pain Edu? --      Excl. in GC? --     Most recent vital signs: Vitals:   12/05/22 2322  BP: (!) 146/77  Pulse: (!) 58  Resp: (!) 21  Temp: 98.2 F (36.8 C)  SpO2: 97%    General: Awake, no distress.  CV:  Good peripheral perfusion.  Resp:  Normal effort.  Abd:  No distention.  Lower abdominal tenderness without peritoneal features.  Upper abdomen is benign Soft and reducible incisional hernia to the inferior aspect of a midline remote abdominal incision MSK:  No deformity noted.  Neuro:  No focal deficits appreciated. Other:     ED Results /  Procedures / Treatments   Labs (all labs ordered are listed, but only abnormal results are displayed) Labs Reviewed  COMPREHENSIVE METABOLIC PANEL - Abnormal; Notable for the following components:      Result Value   Sodium 134 (*)    Chloride 94 (*)    Glucose, Bld 172 (*)    BUN 59 (*)    Creatinine, Ser 1.92 (*)    GFR, Estimated 33 (*)    Anion gap 17 (*)    All other components within normal limits  CBC - Abnormal; Notable for the following components:   WBC 13.9 (*)    All other components within normal limits  LIPASE, BLOOD  URINALYSIS, ROUTINE W REFLEX MICROSCOPIC  TROPONIN I (HIGH SENSITIVITY)  TROPONIN I (HIGH SENSITIVITY)    EKG Sinus tachycardia with a rate of 108 bpm.  Rightward axis and right bundle.  No STEMI.  Couple PVCs  RADIOLOGY CT abdomen/pelvis interpreted by me with fluid-filled loops of bowel concerning for SBO  Official radiology report(s): CT ABDOMEN PELVIS W CONTRAST  Result Date: 12/06/2022 CLINICAL DATA:  Eval SBO and  pyelo. Acute nausea, vomiting, constipation, bloating EXAM: CT ABDOMEN AND PELVIS WITH CONTRAST TECHNIQUE: Multidetector CT imaging of the abdomen and pelvis was performed using the standard protocol following bolus administration of intravenous contrast. RADIATION DOSE REDUCTION: This exam was performed according to the departmental dose-optimization program which includes automated exposure control, adjustment of the mA and/or kV according to patient size and/or use of iterative reconstruction technique. CONTRAST:  75mL OMNIPAQUE IOHEXOL 300 MG/ML  SOLN COMPARISON:  CT pelvis 03/21/2021 and CT abdomen and pelvis 12/15/2018 FINDINGS: Lower chest: No acute abnormality. Hepatobiliary: No focal liver abnormality is seen. No gallstones, gallbladder wall thickening, or biliary dilatation. Pancreas: Unremarkable. Spleen: Unremarkable. Adrenals/Urinary Tract: Normal adrenal glands. Atrophy of the left kidney. Low-attenuation lesions in the kidneys  are statistically likely to represent cysts. No follow-up is required. No urinary calculi or hydronephrosis. Unremarkable bladder. Stomach/Bowel: Large hiatal hernia. Dilated small bowel in the central and left hemiabdomen fecalization of small bowel contents in right lower quadrant immediately upstream from an abrupt transition point best appreciated on sagittal image 51. The bowel downstream from the transition point is decompressed. Normal caliber colon without wall thickening. Herniation of the transverse colon into the midline ventral abdominal wall hernia without evidence of obstruction. Colonic diverticulosis without diverticulitis. Vascular/Lymphatic: Aortic atherosclerosis. No enlarged abdominal or pelvic lymph nodes. Aorto bi-iliac bypass graft. Reproductive: Unremarkable. Other: No free intraperitoneal fluid or air. Ventral abdominal wall hernia containing nonobstructed transverse colon. Additional fat containing umbilical hernia. Musculoskeletal: Right THA.  No acute fracture.  Demineralization. IMPRESSION: 1. Small-bowel obstruction with transition point in the right lower quadrant. 2. Ventral abdominal wall hernia containing nonobstructed transverse colon. 3. Large hiatal hernia. Aortic Atherosclerosis (ICD10-I70.0). Electronically Signed   By: Minerva Fester M.D.   On: 12/06/2022 00:41    PROCEDURES and INTERVENTIONS:  Procedures  Medications  lactated ringers bolus 1,000 mL (1,000 mLs Intravenous New Bag/Given 12/06/22 0003)  ondansetron (ZOFRAN) injection 4 mg (4 mg Intravenous Given 12/06/22 0004)  fentaNYL (SUBLIMAZE) injection 50 mcg (50 mcg Intravenous Given 12/06/22 0005)  iohexol (OMNIPAQUE) 300 MG/ML solution 75 mL (75 mLs Intravenous Contrast Given 12/06/22 0019)     IMPRESSION / MDM / ASSESSMENT AND PLAN / ED COURSE  I reviewed the triage vital signs and the nursing notes.  Differential diagnosis includes, but is not limited to, SBO, pyelonephritis, diverticulitis  {Patient  presents with symptoms of an acute illness or injury that is potentially life-threatening.  Pleasant 89 year old male presents with evidence of an SBO requiring medical admission.  Lower abdominal tenderness without peritoneal features.  Blood work with leukocytosis but I doubt infectious etiology of his symptoms.  He has been compliant with antibiotics for cystitis and reports resolution of his urinary symptoms.  Normal lipase.  Metabolic panel slight worsening of his chronic renal dysfunction.  Negative troponin.  Awaiting urinalysis.  CT with SBO.  Will consult medicine for admission  Clinical Course as of 12/06/22 0153  Thu Dec 06, 2022  0103 Reassessed and discussed SBO.  Need for urine sample.  Discussed possible NG tube but patient reports he does not feel like he needs it right now [DS]    Clinical Course User Index [DS] Delton Prairie, MD     FINAL CLINICAL IMPRESSION(S) / ED DIAGNOSES   Final diagnoses:  Small bowel obstruction (HCC)     Rx / DC Orders   ED Discharge Orders     None        Note:  This document was prepared using  Dragon [employer] engineer and may include unintentional dictation errors.   Delton Prairie, MD 12/06/22 [phone]

## 2022-12-06 ENCOUNTER — Encounter: Payer: Self-pay | Admitting: Internal Medicine

## 2022-12-06 ENCOUNTER — Other Ambulatory Visit: Payer: Self-pay

## 2022-12-06 ENCOUNTER — Emergency Department: Payer: PPO

## 2022-12-06 ENCOUNTER — Inpatient Hospital Stay: Payer: PPO

## 2022-12-06 DIAGNOSIS — Z96641 Presence of right artificial hip joint: Secondary | ICD-10-CM | POA: Diagnosis not present

## 2022-12-06 DIAGNOSIS — D72829 Elevated white blood cell count, unspecified: Secondary | ICD-10-CM

## 2022-12-06 DIAGNOSIS — I130 Hypertensive heart and chronic kidney disease with heart failure and stage 1 through stage 4 chronic kidney disease, or unspecified chronic kidney disease: Secondary | ICD-10-CM | POA: Diagnosis not present

## 2022-12-06 DIAGNOSIS — H3530 Unspecified macular degeneration: Secondary | ICD-10-CM | POA: Diagnosis not present

## 2022-12-06 DIAGNOSIS — I5032 Chronic diastolic (congestive) heart failure: Secondary | ICD-10-CM | POA: Diagnosis not present

## 2022-12-06 DIAGNOSIS — J449 Chronic obstructive pulmonary disease, unspecified: Secondary | ICD-10-CM | POA: Diagnosis not present

## 2022-12-06 DIAGNOSIS — K5730 Diverticulosis of large intestine without perforation or abscess without bleeding: Secondary | ICD-10-CM | POA: Diagnosis not present

## 2022-12-06 DIAGNOSIS — N179 Acute kidney failure, unspecified: Secondary | ICD-10-CM | POA: Diagnosis not present

## 2022-12-06 DIAGNOSIS — K56609 Unspecified intestinal obstruction, unspecified as to partial versus complete obstruction: Principal | ICD-10-CM | POA: Diagnosis present

## 2022-12-06 DIAGNOSIS — E871 Hypo-osmolality and hyponatremia: Secondary | ICD-10-CM | POA: Diagnosis not present

## 2022-12-06 DIAGNOSIS — K439 Ventral hernia without obstruction or gangrene: Secondary | ICD-10-CM | POA: Insufficient documentation

## 2022-12-06 DIAGNOSIS — B964 Proteus (mirabilis) (morganii) as the cause of diseases classified elsewhere: Secondary | ICD-10-CM | POA: Diagnosis not present

## 2022-12-06 DIAGNOSIS — K449 Diaphragmatic hernia without obstruction or gangrene: Secondary | ICD-10-CM | POA: Diagnosis not present

## 2022-12-06 DIAGNOSIS — Z9889 Other specified postprocedural states: Secondary | ICD-10-CM

## 2022-12-06 DIAGNOSIS — J9811 Atelectasis: Secondary | ICD-10-CM | POA: Diagnosis not present

## 2022-12-06 DIAGNOSIS — N1831 Chronic kidney disease, stage 3a: Secondary | ICD-10-CM | POA: Diagnosis not present

## 2022-12-06 DIAGNOSIS — D090 Carcinoma in situ of bladder: Secondary | ICD-10-CM | POA: Diagnosis not present

## 2022-12-06 DIAGNOSIS — Z8616 Personal history of COVID-19: Secondary | ICD-10-CM | POA: Diagnosis not present

## 2022-12-06 DIAGNOSIS — Z4682 Encounter for fitting and adjustment of non-vascular catheter: Secondary | ICD-10-CM | POA: Diagnosis not present

## 2022-12-06 DIAGNOSIS — I2510 Atherosclerotic heart disease of native coronary artery without angina pectoris: Secondary | ICD-10-CM | POA: Diagnosis not present

## 2022-12-06 DIAGNOSIS — Z7982 Long term (current) use of aspirin: Secondary | ICD-10-CM | POA: Diagnosis not present

## 2022-12-06 DIAGNOSIS — Z923 Personal history of irradiation: Secondary | ICD-10-CM | POA: Diagnosis not present

## 2022-12-06 DIAGNOSIS — I4510 Unspecified right bundle-branch block: Secondary | ICD-10-CM | POA: Diagnosis not present

## 2022-12-06 DIAGNOSIS — Z87442 Personal history of urinary calculi: Secondary | ICD-10-CM | POA: Diagnosis not present

## 2022-12-06 DIAGNOSIS — N401 Enlarged prostate with lower urinary tract symptoms: Secondary | ICD-10-CM | POA: Diagnosis not present

## 2022-12-06 DIAGNOSIS — Z87891 Personal history of nicotine dependence: Secondary | ICD-10-CM | POA: Diagnosis not present

## 2022-12-06 DIAGNOSIS — E785 Hyperlipidemia, unspecified: Secondary | ICD-10-CM | POA: Diagnosis not present

## 2022-12-06 DIAGNOSIS — F419 Anxiety disorder, unspecified: Secondary | ICD-10-CM | POA: Diagnosis not present

## 2022-12-06 DIAGNOSIS — N390 Urinary tract infection, site not specified: Secondary | ICD-10-CM | POA: Diagnosis not present

## 2022-12-06 DIAGNOSIS — Z8546 Personal history of malignant neoplasm of prostate: Secondary | ICD-10-CM | POA: Diagnosis not present

## 2022-12-06 DIAGNOSIS — N400 Enlarged prostate without lower urinary tract symptoms: Secondary | ICD-10-CM | POA: Insufficient documentation

## 2022-12-06 LAB — COMPREHENSIVE METABOLIC PANEL
ALT: 16 U/L (ref 0–44)
AST: 23 U/L (ref 15–41)
Albumin: 4.4 g/dL (ref 3.5–5.0)
Alkaline Phosphatase: 102 U/L (ref 38–126)
Anion gap: 17 — ABNORMAL HIGH (ref 5–15)
BUN: 59 mg/dL — ABNORMAL HIGH (ref 8–23)
CO2: 23 mmol/L (ref 22–32)
Calcium: 9.6 mg/dL (ref 8.9–10.3)
Chloride: 94 mmol/L — ABNORMAL LOW (ref 98–111)
Creatinine, Ser: 1.92 mg/dL — ABNORMAL HIGH (ref 0.61–1.24)
GFR, Estimated: 33 mL/min — ABNORMAL LOW (ref >60)
Glucose, Bld: 172 mg/dL — ABNORMAL HIGH (ref 70–99)
Potassium: 5.1 mmol/L (ref 3.5–5.1)
Sodium: 134 mmol/L — ABNORMAL LOW (ref 135–145)
Total Bilirubin: 1.2 mg/dL (ref 0.3–1.2)
Total Protein: 7.8 g/dL (ref 6.5–8.1)

## 2022-12-06 LAB — URINALYSIS, ROUTINE W REFLEX MICROSCOPIC
Bilirubin Urine: NEGATIVE
Glucose, UA: NEGATIVE mg/dL
Hgb urine dipstick: NEGATIVE
Ketones, ur: NEGATIVE mg/dL
Leukocytes,Ua: NEGATIVE
Nitrite: NEGATIVE
Protein, ur: NEGATIVE mg/dL
Specific Gravity, Urine: 1.026 (ref 1.005–1.030)
pH: 6 (ref 5.0–8.0)

## 2022-12-06 LAB — CBC
HCT: 38.7 % — ABNORMAL LOW (ref 39.0–52.0)
Hemoglobin: 12.3 g/dL — ABNORMAL LOW (ref 13.0–17.0)
MCH: 31.5 pg (ref 26.0–34.0)
MCHC: 31.8 g/dL (ref 30.0–36.0)
MCV: 99.2 fL (ref 80.0–100.0)
Platelets: 166 10*3/uL (ref 150–400)
RBC: 3.9 MIL/uL — ABNORMAL LOW (ref 4.22–5.81)
RDW: 12.5 % (ref 11.5–15.5)
WBC: 7 10*3/uL (ref 4.0–10.5)
nRBC: 0 % (ref 0.0–0.2)

## 2022-12-06 LAB — TROPONIN I (HIGH SENSITIVITY)
Troponin I (High Sensitivity): 12 ng/L (ref <18)
Troponin I (High Sensitivity): 13 ng/L (ref <18)

## 2022-12-06 LAB — BASIC METABOLIC PANEL
Anion gap: 12 (ref 5–15)
BUN: 54 mg/dL — ABNORMAL HIGH (ref 8–23)
CO2: 23 mmol/L (ref 22–32)
Calcium: 8.3 mg/dL — ABNORMAL LOW (ref 8.9–10.3)
Chloride: 102 mmol/L (ref 98–111)
Creatinine, Ser: 1.51 mg/dL — ABNORMAL HIGH (ref 0.61–1.24)
GFR, Estimated: 44 mL/min — ABNORMAL LOW (ref >60)
Glucose, Bld: 116 mg/dL — ABNORMAL HIGH (ref 70–99)
Potassium: 4.6 mmol/L (ref 3.5–5.1)
Sodium: 137 mmol/L (ref 135–145)

## 2022-12-06 LAB — MAGNESIUM: Magnesium: 2.1 mg/dL (ref 1.7–2.4)

## 2022-12-06 LAB — LIPASE, BLOOD: Lipase: 36 U/L (ref 11–51)

## 2022-12-06 MED ORDER — HYDRALAZINE HCL 20 MG/ML IJ SOLN: 5.0000 mg | INTRAMUSCULAR | Status: DC | PRN

## 2022-12-06 MED ORDER — ACETAMINOPHEN 650 MG RE SUPP: 650.0000 mg | Freq: Four times a day (QID) | RECTAL | Status: DC | PRN

## 2022-12-06 MED ORDER — LACTATED RINGERS IV SOLN: INTRAVENOUS | Status: DC

## 2022-12-06 MED ORDER — METOCLOPRAMIDE HCL 5 MG/ML IJ SOLN
10.0000 mg | Freq: Once | INTRAMUSCULAR | Status: AC
  Administered 2022-12-06: 10 mg via INTRAVENOUS
  Filled 2022-12-06: qty 2

## 2022-12-06 MED ORDER — ONDANSETRON HCL 4 MG PO TABS: 4.0000 mg | ORAL_TABLET | Freq: Four times a day (QID) | ORAL | Status: DC | PRN

## 2022-12-06 MED ORDER — IOHEXOL 300 MG/ML  SOLN
75.0000 mL | Freq: Once | INTRAMUSCULAR | Status: AC | PRN
  Administered 2022-12-06: 75 mL via INTRAVENOUS

## 2022-12-06 MED ORDER — IPRATROPIUM-ALBUTEROL 0.5-2.5 (3) MG/3ML IN SOLN: 3.0000 mL | RESPIRATORY_TRACT | Status: DC | PRN

## 2022-12-06 MED ORDER — MORPHINE SULFATE (PF) 2 MG/ML IV SOLN: 2.0000 mg | INTRAVENOUS | Status: DC | PRN

## 2022-12-06 MED ORDER — ACETAMINOPHEN 325 MG PO TABS
650.0000 mg | ORAL_TABLET | Freq: Four times a day (QID) | ORAL | Status: DC | PRN
  Administered 2022-12-07: 650 mg via ORAL
  Filled 2022-12-06: qty 2

## 2022-12-06 MED ORDER — UMECLIDINIUM BROMIDE 62.5 MCG/ACT IN AEPB
1.0000 | INHALATION_SPRAY | Freq: Every day | RESPIRATORY_TRACT | Status: DC
  Filled 2022-12-06: qty 7

## 2022-12-06 MED ORDER — ENOXAPARIN SODIUM 40 MG/0.4ML IJ SOSY
0.5000 mg/kg | PREFILLED_SYRINGE | INTRAMUSCULAR | Status: DC
  Administered 2022-12-06 – 2022-12-07 (×2): 37.5 mg via SUBCUTANEOUS
  Filled 2022-12-06 (×3): qty 0.4

## 2022-12-06 MED ORDER — FLUTICASONE FUROATE-VILANTEROL 100-25 MCG/ACT IN AEPB
1.0000 | INHALATION_SPRAY | Freq: Every day | RESPIRATORY_TRACT | Status: DC
  Filled 2022-12-06: qty 28

## 2022-12-06 MED ORDER — ONDANSETRON HCL 4 MG/2ML IJ SOLN: 4.0000 mg | Freq: Four times a day (QID) | INTRAMUSCULAR | Status: DC | PRN

## 2022-12-06 NOTE — Assessment & Plan Note (Signed)
Not exacerbated Continue home inhalers DuoNebs as needed

## 2022-12-06 NOTE — ED Notes (Signed)
NG tube pulled back and repositioned

## 2022-12-06 NOTE — Assessment & Plan Note (Signed)
BP currently controlled Hydralazine as needed while n.p.o.

## 2022-12-06 NOTE — Progress Notes (Signed)
Anticoagulation monitoring(Lovenox):  89 yo  male ordered Lovenox 40 mg Q24h    Filed Weights   12/05/22 2324  Weight: 76.5 kg (168 lb 10.4 oz)   BMI 30.85    Lab Results  Component Value Date   CREATININE 1.92 (H) 12/05/2022   CREATININE 1.35 (H) 12/28/2021   CREATININE 1.51 (H) 12/27/2021   Estimated Creatinine Clearance: 24.3 mL/min (A) (by C-G formula based on SCr of 1.92 mg/dL (H)). Hemoglobin & Hematocrit     Component Value Date/Time   HGB 13.9 12/05/2022 2330   HGB 13.1 12/18/2011 1452   HCT 43.9 12/05/2022 2330   HCT 41.0 12/18/2011 1452     Per Protocol for Patient with estCrcl > 30 ml/min and BMI > 30, will transition to Lovenox 37.5 mg Q24h.

## 2022-12-06 NOTE — Consult Note (Signed)
[facility] SURGICAL CONSULTATION NOTE (initial) - cpt: 16109   HISTORY OF PRESENT ILLNESS (HPI):  89 y.o. male presented to [facility] ED yesterday for evaluation of abdominal pain. Patient reported a recent history of lower abdominal pain, distension, nausea, emesis, and lack of bowel movement. Abdominal pain was crampy and in lower abdomen. Emesis seemed to be bilious in nature. No fever, chills, cough, CPO, SOB. OF note, he does have history of prostate cancer s/p radiation. Previous surgical history positive for AAA repair through laparotomy approach. Work up in the ED revealed mild leukocytosis to 13.9K, Hgb to 13.9, renal function elevated above baseline with sCr - 1.92, mild hyponatremia to 134. CT Abdomen/Pelvis was obtained concerning for possible SBO with transition in right abdomen. He was admitted to medicine service. NGT placement was attempted but patient had significant episode of emesis. NGT also appeared coiled and a such, this was removed. He has done well since then.   This morning, abdomen is soft, he is without pain, no further nausea. He has also had a BM this AM.   Surgery was consulted by hospitalist physician Dr. Lindajo Royal, MD in this context for evaluation and management of SBO.  PAST MEDICAL HISTORY (PMH):  Past Medical History:  Diagnosis Date   AAA (abdominal aortic aneurysm) (HCC)    a.) s/p repair in 2005   Anemia    Anginal pain (HCC)    Anxiety    Arthritis    B12 deficiency    Bilateral cataracts    a.) s/p BILATERAL extractions in 2018   BPH with obstruction/lower urinary tract symptoms    CAD (coronary artery disease)    Carotid artery stenosis    a.) s/p CEA on the RIGHT   CHF (congestive heart failure) (HCC)    COPD (chronic obstructive pulmonary disease) (HCC)    Diastolic dysfunction 07/29/2019   a.)  TTE 07/29/2019: EF 50-55%; LA mildly enlarged; G1DD.   DOE (dyspnea on exertion)    Elevated PSA    Environmental and seasonal  allergies    History of 2019 novel coronavirus disease (COVID-19)    History of kidney stones    HLD (hyperlipidemia)    HOH (hard of hearing)    HTN (hypertension)    Incomplete bladder emptying    Macular degeneration    Prostate cancer (HCC)    RBBB (right bundle branch block)    Valvular insufficiency    a.) TTE 07/29/2019: LVEF 50-55%; LA mild enlarged; triv AR/PR, mild MR, mod TR.     PAST SURGICAL HISTORY [facility]):  Past Surgical History:  Procedure Laterality Date   ABDOMINAL AORTIC ANEURYSM REPAIR     CATARACT EXTRACTION W/PHACO Right 09/25/2016   Procedure: CATARACT EXTRACTION PHACO AND INTRAOCULAR LENS PLACEMENT (IOC);  Surgeon: Galen Manila, MD;  Location: [facility] ORS;  Service: Ophthalmology;  Laterality: Right;  [place] 01:01 AP% 17.4 CDE 10.75 Fluid pack lot # [id number]   CATARACT EXTRACTION W/PHACO Left 10/16/2016   Procedure: CATARACT EXTRACTION PHACO AND INTRAOCULAR LENS PLACEMENT (IOC) Suture placed in Left eye;  Surgeon: Galen Manila, MD;  Location: [facility] ORS;  Service: Ophthalmology;  Laterality: Left;  [place] 2:06.8 AP% 22.2 CDE 28.17 Fluid pack lot # [id number]   COLONOSCOPY WITH PROPOFOL N/A 03/31/2019   Procedure: COLONOSCOPY WITH PROPOFOL;  Surgeon: Christena Deem, MD;  Location: [facility] ENDOSCOPY;  Service: Endoscopy;  Laterality: N/A;   COLONOSCOPY WITH PROPOFOL N/A 09/09/2020   Procedure: COLONOSCOPY WITH PROPOFOL;  Surgeon: Regis Bill, MD;  Location: [facility] ENDOSCOPY;  Service: Endoscopy;  Laterality: N/A;   CYSTOSCOPY W/ RETROGRADES Bilateral 02/23/2020   Procedure: CYSTOSCOPY WITH RETROGRADE PYELOGRAM;  Surgeon: Riki Altes, MD;  Location: [facility] ORS;  Service: Urology;  Laterality: Bilateral;   CYSTOSCOPY WITH BIOPSY N/A 02/23/2020   Procedure: CYSTOSCOPY WITH BIOPSY;  Surgeon: Riki Altes, MD;  Location: [facility] ORS;  Service: Urology;  Laterality: N/A;   EYE SURGERY     HERNIA REPAIR     x 2   INGUINAL HERNIA REPAIR Right 12/02/2015   Procedure:  HERNIA REPAIR INGUINAL ADULT;  Surgeon: Nadeen Landau, MD;  Location: [facility] ORS;  Service: General;  Laterality: Right;   PROSTATE SURGERY  2012   Right Carotid Endarterectomy     TOTAL HIP ARTHROPLASTY Right 08/01/2021   Procedure: TOTAL HIP ARTHROPLASTY;  Surgeon: Christena Flake, MD;  Location: [facility] ORS;  Service: Orthopedics;  Laterality: Right;     MEDICATIONS:  Prior to Admission medications   Medication Sig Start Date End Date Taking? Authorizing Provider  acetaminophen (TYLENOL) 500 MG tablet Take 1-2 tablets (500-1,000 mg total) by mouth every 6 (six) hours as needed for mild pain. 08/04/21  Yes Anson Oregon, PA-C  amLODipine (NORVASC) 10 MG tablet Take 1 tablet (10 mg total) by mouth daily. 12/29/21 12/06/22 Yes Charise Killian, MD  aspirin EC 81 MG tablet Take 1 tablet (81 mg total) by mouth daily. Swallow whole. 12/29/21  Yes Charise Killian, MD  cephALEXin (KEFLEX) 500 MG capsule Take 1 capsule (500 mg total) by mouth 2 (two) times daily for 7 days. 11/29/22 12/06/22 Yes Vaillancourt, Samantha, PA-C  cyanocobalamin (VITAMIN B12) 1000 MCG tablet Take 1,000 mcg by mouth daily. 07/09/22 07/09/23 Yes [provider]  Fluticasone-Umeclidin-Vilant (TRELEGY ELLIPTA) 100-62.5-25 MCG/INH AEPB Inhale 2 puffs into the lungs daily. 09/01/19  Yes [provider]  iron polysaccharides (NIFEREX) 150 MG capsule Take 150 mg by mouth 2 (two) times daily. 10/23/21  Yes [provider]  LORazepam (ATIVAN) 0.5 MG tablet Take 0.5 mg by mouth daily as needed for anxiety. 10/23/21  Yes [provider]  metoprolol succinate (TOPROL-XL) 50 MG 24 hr tablet Take 1 tablet (50 mg total) by mouth daily. Take with or immediately following a meal. 12/29/21 12/06/22 Yes Charise Killian, MD  tamsulosin (FLOMAX) 0.4 MG CAPS capsule Take 0.4 mg by mouth daily. 10/04/22  Yes [provider]     ALLERGIES:  Allergies  Allergen Reactions   Oxycodone Other (See  Comments)    Caused hallucinations/does not want anymore     SOCIAL HISTORY:  Social History   Socioeconomic History   Marital status: Married    Spouse name: Not on file   Number of children: Not on file   Years of education: Not on file   Highest education level: Not on file  Occupational History   Not on file  Tobacco Use   Smoking status: Former    Types: Cigarettes    Quit date: 11/30/1998    Years since quitting: 24.0   Smokeless tobacco: Former   Tobacco comments:    quit 10 years ago  Vaping Use   Vaping Use: Never used  Substance and Sexual Activity   Alcohol use: Not Currently    Comment: occasional   Drug use: No   Sexual activity: Not on file  Other Topics Concern   Not on file  Social History Narrative   Not on file   Social Determinants of  Health   Financial Resource Strain: Not on file  Food Insecurity: Not on file  Transportation Needs: Not on file  Physical Activity: Not on file  Stress: Not on file  Social Connections: Not on file  Intimate Partner Violence: Not on file     FAMILY HISTORY:  Family History  Problem Relation Age of Onset   Cancer Brother        2000   Prostate cancer Neg Hx    Bladder Cancer Neg Hx    Kidney cancer Neg Hx       REVIEW OF SYSTEMS:  Review of Systems  Constitutional:  Negative for chills and fever.  Respiratory:  Negative for cough and shortness of breath.   Cardiovascular:  Negative for chest pain and palpitations.  Gastrointestinal:  Positive for abdominal pain, constipation, nausea and vomiting. Negative for diarrhea.  Genitourinary:  Negative for dysuria and urgency.  All other systems reviewed and are negative.   VITAL SIGNS:  Temp:  [98.1 F (36.7 C)-98.2 F (36.8 C)] 98.1 F (36.7 C) (06/20 0752) Pulse Rate:  [58-102] 95 (06/20 0940) Resp:  [18-21] 18 (06/20 0940) BP: (105-146)/(62-77) 133/67 (06/20 0940) SpO2:  [93 %-97 %] 96 % (06/20 0940) Weight:  [76.5 kg] 76.5 kg (06/19 2324)      Height: 5\' 2"  (157.5 cm) Weight: 76.5 kg BMI (Calculated): 30.84   INTAKE/OUTPUT:  06/19 0701 - 06/20 0700 In: 1000 [IV Piggyback:1000] Out: -   PHYSICAL EXAM:  Physical Exam Vitals and nursing note reviewed. Exam conducted with a chaperone present.  Constitutional:      General: He is not in acute distress.    Appearance: He is well-developed. He is not ill-appearing.  HENT:     Head: Normocephalic and atraumatic.  Eyes:     General: No scleral icterus.    Extraocular Movements: Extraocular movements intact.  Cardiovascular:     Rate and Rhythm: Normal rate.     Heart sounds: Normal heart sounds.  Pulmonary:     Effort: Pulmonary effort is normal. No respiratory distress.  Abdominal:     General: Abdomen is flat. A surgical scar is present. There is no distension.     Palpations: Abdomen is soft.     Tenderness: There is no abdominal tenderness. There is no guarding or rebound.     Hernia: A hernia is present. Hernia is present in the ventral area (Soft).     Comments: Abdomen is soft, non-tender, no significant distension, no rebound/guarding. He is certainly without peritonitis. Also with two soft ventral hernias   Genitourinary:    Comments: Deferred Skin:    General: Skin is warm and dry.  Neurological:     General: No focal deficit present.     Mental Status: He is alert and oriented to person, place, and time.  Psychiatric:        Mood and Affect: Mood normal.        Behavior: Behavior normal.      Labs:     Latest Ref Rng & Units 12/05/2022   11:30 PM 09/10/2022    3:29 PM 05/08/2022    3:14 PM  CBC  WBC 4.0 - 10.5 K/uL 13.9  7.3  6.6   Hemoglobin 13.0 - 17.0 g/dL 16.1  09.6  04.5   Hematocrit 39.0 - 52.0 % 43.9  37.8  38.3   Platelets 150 - 400 K/uL 231  202  166       Latest Ref Rng & Units  12/05/2022   11:30 PM 12/28/2021   12:59 PM 12/28/2021    4:58 AM  CMP  Glucose 70 - 99 mg/dL 098   119   BUN 8 - 23 mg/dL 59   53   Creatinine 1.47 - 1.24 mg/dL  8.29   5.62   Sodium 130 - 145 mmol/L 134   136   Potassium 3.5 - 5.1 mmol/L 5.1  4.9  5.3   Chloride 98 - 111 mmol/L 94   106   CO2 22 - 32 mmol/L 23   24   Calcium 8.9 - 10.3 mg/dL 9.6   8.8   Total Protein 6.5 - 8.1 g/dL 7.8     Total Bilirubin 0.3 - 1.2 mg/dL 1.2     Alkaline Phos 38 - 126 U/L 102     AST 15 - 41 U/L 23     ALT 0 - 44 U/L 16        Imaging studies:   CT Abdomen/Pelvis (12/06/2022) personally reviewed dilation of stomach, hiatal hernia, dilated small bowel with decompressed loops, suspect transition in right abdomen, no free air, no pneumatosis, and radiologist report reviewed below: IMPRESSION: 1. Small-bowel obstruction with transition point in the right lower quadrant. 2. Ventral abdominal wall hernia containing nonobstructed transverse colon. 3. Large hiatal hernia.   Assessment/Plan: (ICD-10's: K9.609) 89 y.o. male with, now clinically improved, likely small bowel obstruction secondary to post-surgical adhesive disease   - Appreciate medicine admission  - I think we can continue to hold off on NGT decompression given resolution in symptoms  - No role for surgical intervention at this time; hernias soft and not source of obstruction  - NPO for now. If he continues to have bowel function without N/V, we can start CLD later today  - Monitor abdominal examination; on-going bowel function   - Pain control prn (limit narcotics); antiemetics prn - Monitor renal function - Mobilize    - Further management per primary service; we will follow   All of the above findings and recommendations were discussed with the patient and his family, and all of patient's and family's questions were answered to their expressed satisfaction.  Thank you for the opportunity to participate in this patient's care.   -- Lynden Oxford, PA-C [facility] 12/06/2022, 9:47 AM M-F: 7am - 4pm

## 2022-12-06 NOTE — Assessment & Plan Note (Signed)
Secondary to vomiting and fluid losses related to SBO IV hydration Monitor renal function and avoid nephrotoxins

## 2022-12-06 NOTE — Assessment & Plan Note (Addendum)
Likely reactive related to SBO Continue to monitor Hold off on antibiotics.  Consider anaerobic coverage for GI source if worsening   follow-up urinalysis

## 2022-12-06 NOTE — H&P (Signed)
History and Physical    Patient: Ruben Green UJW:[id number] DOB: Jan 01, 1934 DOA: 12/05/2022 DOS: the patient was seen and examined on 12/06/2022 PCP: Barbette Reichmann, MD  Patient coming from: Home  Chief Complaint:  Chief Complaint  Patient presents with   Abdominal Pain    HPI: Ruben Green is a 89 y.o. male with medical history significant for CAD, COPD (has oxygen at home but does not use it), HTN, open AAA repair several years ago, BPH and carcinoma in situ of the bladder, currently on Keflex for Proteus UTI prescribed by his urologist, who presents to the ED with a 3-day history of lower abdominal pain 9 out of 10 at its worst, nausea with intermittent vomiting, abdominal bloating, constipation and absence of flatus.  He denies fever or chills.  He denies chest pain, shortness of breath or cough beyond his baseline.  Patient speaks [place] and most of the history is provided by his wife at the bedside who also serves as Nurse, learning disability. ED course and data review: Vitals unremarkable.  Labs notable for leukocytosis of 13,900, normal hemoglobin, creatinine 1.92 up from baseline of 1.31.  Lipase and LFTs WNL, troponin of 12. EKG, personally viewed and interpreted showing sinus tachycardia at 108 with RBBB. CT abdomen and pelvis significant for SBO as detailed as follows IMPRESSION: 1. Small-bowel obstruction with transition point in the right lower quadrant. 2. Ventral abdominal wall hernia containing nonobstructed transverse colon. 3. Large hiatal hernia.  Patient treated with LR bolus, Zofran and fentanyl. Patient declined NG tube but had no vomiting in the ED Hospitalist consulted for admission.     Past Medical History:  Diagnosis Date   AAA (abdominal aortic aneurysm) (HCC)    a.) s/p repair in 2005   Anemia    Anginal pain (HCC)    Anxiety    Arthritis    B12 deficiency    Bilateral cataracts    a.) s/p BILATERAL extractions in 2018   BPH with  obstruction/lower urinary tract symptoms    CAD (coronary artery disease)    Carotid artery stenosis    a.) s/p CEA on the RIGHT   CHF (congestive heart failure) (HCC)    COPD (chronic obstructive pulmonary disease) (HCC)    Diastolic dysfunction 07/29/2019   a.)  TTE 07/29/2019: EF 50-55%; LA mildly enlarged; G1DD.   DOE (dyspnea on exertion)    Elevated PSA    Environmental and seasonal allergies    History of 2019 novel coronavirus disease (COVID-19)    History of kidney stones    HLD (hyperlipidemia)    HOH (hard of hearing)    HTN (hypertension)    Incomplete bladder emptying    Macular degeneration    Prostate cancer (HCC)    RBBB (right bundle branch block)    Valvular insufficiency    a.) TTE 07/29/2019: LVEF 50-55%; LA mild enlarged; triv AR/PR, mild MR, mod TR.   Past Surgical History:  Procedure Laterality Date   ABDOMINAL AORTIC ANEURYSM REPAIR     CATARACT EXTRACTION W/PHACO Right 09/25/2016   Procedure: CATARACT EXTRACTION PHACO AND INTRAOCULAR LENS PLACEMENT (IOC);  Surgeon: Galen Manila, MD;  Location: [facility] ORS;  Service: Ophthalmology;  Laterality: Right;  [place] 01:01 AP% 17.4 CDE 10.75 Fluid pack lot # [id number]   CATARACT EXTRACTION W/PHACO Left 10/16/2016   Procedure: CATARACT EXTRACTION PHACO AND INTRAOCULAR LENS PLACEMENT (IOC) Suture placed in Left eye;  Surgeon: Galen Manila, MD;  Location: [facility];  Service: Ophthalmology;  Laterality: Left;  [place] 2:06.8 AP% 22.2 CDE 28.17 Fluid pack lot # [id number]   COLONOSCOPY WITH PROPOFOL N/A 03/31/2019   Procedure: COLONOSCOPY WITH PROPOFOL;  Surgeon: Christena Deem, MD;  Location: [facility] ENDOSCOPY;  Service: Endoscopy;  Laterality: N/A;   COLONOSCOPY WITH PROPOFOL N/A 09/09/2020   Procedure: COLONOSCOPY WITH PROPOFOL;  Surgeon: Regis Bill, MD;  Location: [facility] ENDOSCOPY;  Service: Endoscopy;  Laterality: N/A;   CYSTOSCOPY W/ RETROGRADES Bilateral 02/23/2020   Procedure: CYSTOSCOPY WITH RETROGRADE  PYELOGRAM;  Surgeon: Riki Altes, MD;  Location: [facility];  Service: Urology;  Laterality: Bilateral;   CYSTOSCOPY WITH BIOPSY N/A 02/23/2020   Procedure: CYSTOSCOPY WITH BIOPSY;  Surgeon: Riki Altes, MD;  Location: [facility];  Service: Urology;  Laterality: N/A;   EYE SURGERY     HERNIA REPAIR     x 2   INGUINAL HERNIA REPAIR Right 12/02/2015   Procedure: HERNIA REPAIR INGUINAL ADULT;  Surgeon: Nadeen Landau, MD;  Location: [facility] ORS;  Service: General;  Laterality: Right;   PROSTATE SURGERY  2012   Right Carotid Endarterectomy     TOTAL HIP ARTHROPLASTY Right 08/01/2021   Procedure: TOTAL HIP ARTHROPLASTY;  Surgeon: Christena Flake, MD;  Location: [facility] ORS;  Service: Orthopedics;  Laterality: Right;   Social History:  reports that he quit smoking about 24 years ago. His smoking use included cigarettes. He has quit using smokeless tobacco. He reports that he does not currently use alcohol. He reports that he does not use drugs.  Allergies  Allergen Reactions   Oxycodone Other (See Comments)    Caused hallucinations/does not want anymore    Family History  Problem Relation Age of Onset   Cancer Brother        2000   Prostate cancer Neg Hx    Bladder Cancer Neg Hx    Kidney cancer Neg Hx     Prior to Admission medications   Medication Sig Start Date End Date Taking? Authorizing Provider  acetaminophen (TYLENOL) 500 MG tablet Take 1-2 tablets (500-1,000 mg total) by mouth every 6 (six) hours as needed for mild pain. 08/04/21  Yes Anson Oregon, PA-C  amLODipine (NORVASC) 10 MG tablet Take 1 tablet (10 mg total) by mouth daily. 12/29/21 12/06/22 Yes Charise Killian, MD  aspirin EC 81 MG tablet Take 1 tablet (81 mg total) by mouth daily. Swallow whole. 12/29/21  Yes Charise Killian, MD  cephALEXin (KEFLEX) 500 MG capsule Take 1 capsule (500 mg total) by mouth 2 (two) times daily for 7 days. 11/29/22 12/06/22 Yes Vaillancourt, Samantha, PA-C  cyanocobalamin (VITAMIN  B12) 1000 MCG tablet Take 1,000 mcg by mouth daily. 07/09/22 07/09/23 Yes [provider]  Fluticasone-Umeclidin-Vilant (TRELEGY ELLIPTA) 100-62.5-25 MCG/INH AEPB Inhale 2 puffs into the lungs daily. 09/01/19  Yes [provider]  iron polysaccharides (NIFEREX) 150 MG capsule Take 150 mg by mouth 2 (two) times daily. 10/23/21  Yes [provider]  LORazepam (ATIVAN) 0.5 MG tablet Take 0.5 mg by mouth daily as needed for anxiety. 10/23/21  Yes [provider]  metoprolol succinate (TOPROL-XL) 50 MG 24 hr tablet Take 1 tablet (50 mg total) by mouth daily. Take with or immediately following a meal. 12/29/21 12/06/22 Yes Charise Killian, MD  tamsulosin (FLOMAX) 0.4 MG CAPS capsule Take 0.4 mg by mouth daily. 10/04/22  Yes [provider]    Physical Exam: Vitals:   12/05/22 2322 12/05/22 2324  BP: (!) 146/77   Pulse: (!) 58  Resp: (!) 21   Temp: 98.2 F (36.8 C)   TempSrc: Oral   SpO2: 97%   Weight:  76.5 kg  Height:  5\' 2"  (1.575 m)   Physical Exam Vitals and nursing note reviewed.  Constitutional:      General: He is not in acute distress. HENT:     Head: Normocephalic and atraumatic.  Cardiovascular:     Rate and Rhythm: Normal rate and regular rhythm.     Heart sounds: Normal heart sounds.  Pulmonary:     Effort: Pulmonary effort is normal.     Breath sounds: Normal breath sounds.  Abdominal:     Palpations: Abdomen is soft.     Tenderness: There is generalized abdominal tenderness.  Neurological:     Mental Status: Mental status is at baseline.     Labs on Admission: I have personally reviewed following labs and imaging studies  CBC: Recent Labs  Lab 12/05/22 2330  WBC 13.9*  HGB 13.9  HCT 43.9  MCV 97.1  PLT 231   Basic Metabolic Panel: Recent Labs  Lab 12/05/22 2330  NA 134*  K 5.1  CL 94*  CO2 23  GLUCOSE 172*  BUN 59*  CREATININE 1.92*  CALCIUM 9.6   GFR: Estimated Creatinine Clearance: 24.3 mL/min (A) (by  C-G formula based on SCr of 1.92 mg/dL (H)). Liver Function Tests: Recent Labs  Lab 12/05/22 2330  AST 23  ALT 16  ALKPHOS 102  BILITOT 1.2  PROT 7.8  ALBUMIN 4.4   Recent Labs  Lab 12/05/22 2330  LIPASE 36   No results for input(s): "AMMONIA" in the last 168 hours. Coagulation Profile: No results for input(s): "INR", "PROTIME" in the last 168 hours. Cardiac Enzymes: No results for input(s): "CKTOTAL", "CKMB", "CKMBINDEX", "TROPONINI" in the last 168 hours. BNP (last 3 results) No results for input(s): "PROBNP" in the last 8760 hours. HbA1C: No results for input(s): "HGBA1C" in the last 72 hours. CBG: No results for input(s): "GLUCAP" in the last 168 hours. Lipid Profile: No results for input(s): "CHOL", "HDL", "LDLCALC", "TRIG", "CHOLHDL", "LDLDIRECT" in the last 72 hours. Thyroid Function Tests: No results for input(s): "TSH", "T4TOTAL", "FREET4", "T3FREE", "THYROIDAB" in the last 72 hours. Anemia Panel: No results for input(s): "VITAMINB12", "FOLATE", "FERRITIN", "TIBC", "IRON", "RETICCTPCT" in the last 72 hours. Urine analysis:    Component Value Date/Time   COLORURINE STRAW (A) 12/23/2021 0444   APPEARANCEUR Hazy (A) 11/29/2022 1433   LABSPEC 1.012 12/23/2021 0444   PHURINE 6.0 12/23/2021 0444   GLUCOSEU Negative 11/29/2022 1433   HGBUR NEGATIVE 12/23/2021 0444   BILIRUBINUR Negative 11/29/2022 1433   KETONESUR NEGATIVE 12/23/2021 0444   PROTEINUR Trace (A) 11/29/2022 1433   PROTEINUR NEGATIVE 12/23/2021 0444   NITRITE Negative 11/29/2022 1433   NITRITE NEGATIVE 12/23/2021 0444   LEUKOCYTESUR 3+ (A) 11/29/2022 1433   LEUKOCYTESUR NEGATIVE 12/23/2021 0444    Radiological Exams on Admission: CT ABDOMEN PELVIS W CONTRAST  Result Date: 12/06/2022 CLINICAL DATA:  Eval SBO and pyelo. Acute nausea, vomiting, constipation, bloating EXAM: CT ABDOMEN AND PELVIS WITH CONTRAST TECHNIQUE: Multidetector CT imaging of the abdomen and pelvis was performed using the  standard protocol following bolus administration of intravenous contrast. RADIATION DOSE REDUCTION: This exam was performed according to the departmental dose-optimization program which includes automated exposure control, adjustment of the mA and/or kV according to patient size and/or use of iterative reconstruction technique. CONTRAST:  75mL OMNIPAQUE IOHEXOL 300 MG/ML  SOLN COMPARISON:  CT pelvis 03/21/2021 and  CT abdomen and pelvis 12/15/2018 FINDINGS: Lower chest: No acute abnormality. Hepatobiliary: No focal liver abnormality is seen. No gallstones, gallbladder wall thickening, or biliary dilatation. Pancreas: Unremarkable. Spleen: Unremarkable. Adrenals/Urinary Tract: Normal adrenal glands. Atrophy of the left kidney. Low-attenuation lesions in the kidneys are statistically likely to represent cysts. No follow-up is required. No urinary calculi or hydronephrosis. Unremarkable bladder. Stomach/Bowel: Large hiatal hernia. Dilated small bowel in the central and left hemiabdomen fecalization of small bowel contents in right lower quadrant immediately upstream from an abrupt transition point best appreciated on sagittal image 51. The bowel downstream from the transition point is decompressed. Normal caliber colon without wall thickening. Herniation of the transverse colon into the midline ventral abdominal wall hernia without evidence of obstruction. Colonic diverticulosis without diverticulitis. Vascular/Lymphatic: Aortic atherosclerosis. No enlarged abdominal or pelvic lymph nodes. Aorto bi-iliac bypass graft. Reproductive: Unremarkable. Other: No free intraperitoneal fluid or air. Ventral abdominal wall hernia containing nonobstructed transverse colon. Additional fat containing umbilical hernia. Musculoskeletal: Right THA.  No acute fracture.  Demineralization. IMPRESSION: 1. Small-bowel obstruction with transition point in the right lower quadrant. 2. Ventral abdominal wall hernia containing nonobstructed  transverse colon. 3. Large hiatal hernia. Aortic Atherosclerosis (ICD10-I70.0). Electronically Signed   By: Minerva Fester M.D.   On: 12/06/2022 00:41     Data Reviewed: Relevant notes from primary care and specialist visits, past discharge summaries as available in EHR, including Care Everywhere. Prior diagnostic testing as pertinent to current admission diagnoses Updated medications and problem lists for reconciliation ED course, including vitals, labs, imaging, treatment and response to treatment Triage notes, nursing and pharmacy notes and ED provider's notes Notable results as noted in HPI   Assessment and Plan: * Small bowel obstruction (HCC) History of prior laparotomy (open AAA repair) Keep n.p.o. Pain management, IV antiemetics, IV hydration Patient declines NG tube but will encourage if vomiting resumes Aspiration precautions Surgical consult  Urinary tract infection due to Proteus Has been on Keflex since 6/13 Follow-up repeat urinalysis Patient denies dysuria so will hold off on antibiotics  Leukocytosis Likely reactive related to SBO Continue to monitor Hold off on antibiotics.  Consider anaerobic coverage for GI source if worsening   follow-up urinalysis  Acute renal failure superimposed on stage 3a chronic kidney disease (HCC) Secondary to vomiting and fluid losses related to SBO IV hydration Monitor renal function and avoid nephrotoxins  Chronic diastolic CHF (congestive heart failure) (HCC) Currently compensated Daily weights Monitor for fluid overload with IV fluid hydration  COPD (chronic obstructive pulmonary disease) (HCC) Not exacerbated Continue home inhalers DuoNebs as needed  Essential (primary) hypertension BP currently controlled Hydralazine as needed while n.p.o.  Ventral hernia CT showing ventral abdominal wall hernia containing nonobstructed transverse colon  Carcinoma in situ of bladder BPH No acute issues Followed by urology,  last seen 6/13 Resume tamsulosin once vomiting resolved  Coronary artery disease involving native coronary artery of native heart without angina pectoris Will hold home metoprolol, aspirin    DVT prophylaxis: Lovenox  Consults: Surgery, Dr. Claudine Mouton  Advance Care Planning:   Code Status: Prior   Family Communication: Wife at bedside  Disposition Plan: Back to previous home environment  Severity of Illness: The appropriate patient status for this patient is INPATIENT. Inpatient status is judged to be reasonable and necessary in order to provide the required intensity of service to ensure the patient's safety. The patient's presenting symptoms, physical exam findings, and initial radiographic and laboratory data in the context of their chronic comorbidities is  felt to place them at high risk for further clinical deterioration. Furthermore, it is not anticipated that the patient will be medically stable for discharge from the hospital within 2 midnights of admission.   * I certify that at the point of admission it is my clinical judgment that the patient will require inpatient hospital care spanning beyond 2 midnights from the point of admission due to high intensity of service, high risk for further deterioration and high frequency of surveillance required.*  Author: Andris Baumann, MD 12/06/2022 2:14 AM  For on call review [email or web address].

## 2022-12-06 NOTE — Assessment & Plan Note (Signed)
Currently compensated Daily weights Monitor for fluid overload with IV fluid hydration

## 2022-12-06 NOTE — Assessment & Plan Note (Addendum)
History of prior laparotomy (open AAA repair) Keep n.p.o. Pain management, IV antiemetics, IV hydration Patient declines NG tube but will encourage if vomiting resumes Aspiration precautions Surgical consult

## 2022-12-06 NOTE — Assessment & Plan Note (Signed)
Will hold home metoprolol, aspirin

## 2022-12-06 NOTE — ED Notes (Signed)
It was reported by the off-going nurse that this patient had recently had a mid-sized soft bowel movement.

## 2022-12-06 NOTE — Evaluation (Signed)
Occupational Therapy Evaluation Patient Details Name: CIAN LABRIE MRN: [id number] DOB: 1934-05-01 Today's Date: 12/06/2022   History of Present Illness Logan WILLMER HILTON is a 89 y.o. male with medical history significant for CAD, COPD (has oxygen at home but does not use it), HTN, open AAA repair several years ago, BPH and carcinoma in situ of the bladder, currently on Keflex for Proteus UTI prescribed by his urologist, who presents to the ED with a 3-day history of lower abdominal pain 9 out of 10 at its worst, nausea with intermittent vomiting, abdominal bloating, constipation and absence of flatus.  Workup in the ED revealed SBO seen on CT scan.  NG tube placement was attempted however the patient did not tolerate.   Clinical Impression   Pt HOH, had difficulties hearing interpreter ([employer] [phone]), eval completed with PT. Pt able to perform functional mobility in hallway to bathroom with min guard and no AD. Patient presenting with generalized weakness and decreased tolerance to activity which limits full participation in ADLs. Unsure of PLOF d/t pt's HOH/non-English speaking (chart review from previous stay indicates pt lives at home with spouse and was IND). Patient currently functioning below baseline. Patient will benefit from acute OT to increase overall independence in the areas of ADLs, functional mobility, reduce caregiver burden and decrease risk of falls in order to safely discharge.       Recommendations for follow up therapy are one component of a multi-disciplinary discharge planning process, led by the attending physician.  Recommendations may be updated based on patient status, additional functional criteria and insurance authorization.   Assistance Recommended at Discharge Intermittent Supervision/Assistance  Patient can return home with the following A little help with walking and/or transfers;A little help with bathing/dressing/bathroom;Help with stairs or ramp for  entrance;Direct supervision/assist for financial management;Direct supervision/assist for medications management;Assist for transportation    Functional Status Assessment  Patient has had a recent decline in their functional status and demonstrates the ability to make significant improvements in function in a reasonable and predictable amount of time.  Equipment Recommendations  None recommended by OT    Recommendations for Other Services       Precautions / Restrictions Precautions Precautions: None Restrictions Weight Bearing Restrictions: No      Mobility Bed Mobility Overal bed mobility: Modified Independent             General bed mobility comments: increased time    Transfers Overall transfer level: Modified independent Equipment used: None                      Balance Overall balance assessment: Mild deficits observed, not formally tested                                         ADL either performed or assessed with clinical judgement   ADL Overall ADL's : Needs assistance/impaired                         Toilet Transfer: Min guard (No AD)   Toileting- Clothing Manipulation and Hygiene: Minimal assistance (MinA for line and clothing mgmt)       Functional mobility during ADLs: Min guard (no AD) General ADL Comments: Pt performing toilet transfer with overall min guard and no AD, anticipate pt would require min guard to minA for LB ADL performance.  Pertinent Vitals/Pain Pain Assessment Pain Assessment: No/denies pain     Hand Dominance Right   Extremity/Trunk Assessment Upper Extremity Assessment Upper Extremity Assessment: Overall WFL for tasks assessed   Lower Extremity Assessment Lower Extremity Assessment: Overall WFL for tasks assessed       Communication Communication Communication: HOH;Prefers language other than English   Cognition Arousal/Alertness: Awake/alert Behavior During Therapy:  WFL for tasks assessed/performed Overall Cognitive Status: Difficult to assess                                                  Home Living Family/patient expects to be discharged to:: Private residence Living Arrangements: Spouse/significant other Available Help at Discharge: Family;Available 24 hours/day Type of Home: House Home Access: Stairs to enter [employer] of Steps: 2 Entrance Stairs-Rails: None                            Prior Functioning/Environment Prior Level of Function : Independent/Modified Independent                        OT Problem List: Decreased strength;Decreased activity tolerance;Impaired balance (sitting and/or standing)      OT Treatment/Interventions: Self-care/ADL training;Therapeutic activities;Patient/family education;Balance training    OT Goals(Current goals can be found in the care plan section) Acute Rehab OT Goals OT Goal Formulation: Patient unable to participate in goal setting Time For Goal Achievement: 12/20/22 Potential to Achieve Goals: Good  OT Frequency: Min 1X/week    Co-evaluation PT/OT/SLP Co-Evaluation/Treatment: Yes Reason for Co-Treatment: To address functional/ADL transfers;For patient/therapist safety PT goals addressed during session: Mobility/safety with mobility OT goals addressed during session: ADL's and self-care      AM-PAC OT "6 Clicks" Daily Activity     Outcome Measure Help from another person eating meals?: None   Help from another person toileting, which includes using toliet, bedpan, or urinal?: A Little Help from another person bathing (including washing, rinsing, drying)?: A Little Help from another person to put on and taking off regular upper body clothing?: A Little Help from another person to put on and taking off regular lower body clothing?: A Little 6 Click Score: 16   End of Session    Activity Tolerance: Patient tolerated treatment  well Patient left: in bed;with call bell/phone within reach;with bed alarm set  OT Visit Diagnosis: Muscle weakness (generalized) (M62.81);Unsteadiness on feet (R26.81)                Time: 1610-9604 OT Time Calculation (min): 10 min Charges:  OT General Charges $OT Visit: 1 Visit OT Evaluation $OT Eval Low Complexity: 1 Low Jaqwon Manfred L. Mariselda Badalamenti, OTR/L  12/06/22, 4:51 PM

## 2022-12-06 NOTE — Assessment & Plan Note (Addendum)
BPH No acute issues Followed by urology, last seen 6/13 Resume tamsulosin once vomiting resolved

## 2022-12-06 NOTE — ED Notes (Signed)
NG tube coiled again due to large hiatal hernia, per ED physician remove NG tube since pt has decompressed appx 1000 cc of vomit during attempt of placement.

## 2022-12-06 NOTE — Assessment & Plan Note (Signed)
Has been on Keflex since 6/13 Follow-up repeat urinalysis Patient denies dysuria so will hold off on antibiotics

## 2022-12-06 NOTE — Assessment & Plan Note (Addendum)
CT showing ventral abdominal wall hernia containing nonobstructed transverse colon

## 2022-12-06 NOTE — Evaluation (Signed)
Physical Therapy Evaluation Patient Details Name: Ruben Green MRN: [id number] DOB: [birth year] Today's Date: 12/06/2022  History of Present Illness  Ruben Green is a 89 y.o. male with medical history significant for CAD, COPD (has oxygen at home but does not use it), HTN, open AAA repair several years ago, BPH and carcinoma in situ of the bladder, currently on Keflex for Proteus UTI prescribed by his urologist, who presents to the ED with a 3-day history of lower abdominal pain 9 out of 10 at its worst, nausea with intermittent vomiting, abdominal bloating, constipation and absence of flatus.  Workup in the ED revealed SBO seen on CT scan.  NG tube placement was attempted however the patient did not tolerate.   Clinical Impression  Patient received in bed, son present but leaving, therefore used interpreter, Ruben Green. Patient having a difficult time hearing interpreter through [employer] due to hearing aides going dead. He is able to perform supine><sit with mod I and stands with min guard. Ambulated to bathroom ~20 feet with min guard. No AD. Patient will continue to benefit from skilled PT to improve functional independence and safety.         Recommendations for follow up therapy are one component of a multi-disciplinary discharge planning process, led by the attending physician.  Recommendations may be updated based on patient status, additional functional criteria and insurance authorization.  Follow Up Recommendations       Assistance Recommended at Discharge Intermittent Supervision/Assistance  Patient can return home with the following  A little help with walking and/or transfers;A little help with bathing/dressing/bathroom;Assist for transportation;Help with stairs or ramp for entrance    Equipment Recommendations None recommended by PT  Recommendations for Other Services       Functional Status Assessment Patient has had a recent decline in their functional status  and demonstrates the ability to make significant improvements in function in a reasonable and predictable amount of time.     Precautions / Restrictions Precautions Precautions: None Restrictions Weight Bearing Restrictions: No      Mobility  Bed Mobility Overal bed mobility: Modified Independent             General bed mobility comments: increased time    Transfers Overall transfer level: Modified independent Equipment used: None                    Ambulation/Gait Ambulation/Gait assistance: Supervision Gait Distance (Feet): 40 Feet Assistive device: None Gait Pattern/deviations: Step-through pattern Gait velocity: WNL     General Gait Details: no lob ambulating to restroom and back, HR up to 126 with this.  Stairs            Wheelchair Mobility    Modified Rankin (Stroke Patients Only)       Balance Overall balance assessment: Mild deficits observed, not formally tested                                           Pertinent Vitals/Pain Pain Assessment Pain Assessment: No/denies pain    Home Living Family/patient expects to be discharged to:: Private residence Living Arrangements: Spouse/significant other Available Help at Discharge: Family;Available 24 hours/day Type of Home: House Home Access: Stairs to enter Entrance Stairs-Rails: None Entrance Stairs-Number of Steps: 2            Prior Function Prior Level of Function : Independent/Modified  Independent                     Hand Dominance   Dominant Hand: Right    Extremity/Trunk Assessment   Upper Extremity Assessment Upper Extremity Assessment: Overall WFL for tasks assessed    Lower Extremity Assessment Lower Extremity Assessment: Overall WFL for tasks assessed       Communication   Communication: HOH;Prefers language other than English  Cognition Arousal/Alertness: Awake/alert Behavior During Therapy: WFL for tasks  assessed/performed Overall Cognitive Status: Difficult to assess                                          General Comments      Exercises     Assessment/Plan    PT Assessment Patient needs continued PT services  PT Problem List Decreased strength;Decreased activity tolerance;Decreased mobility       PT Treatment Interventions DME instruction;Gait training;Therapeutic exercise;Stair training;Functional mobility training;Therapeutic activities;Patient/family education;Balance training    PT Goals (Current goals can be found in the Care Plan section)  Acute Rehab PT Goals Patient Stated Goal: none stated Time For Goal Achievement: 12/16/22    Frequency Min 2X/week     Co-evaluation               AM-PAC PT "6 Clicks" Mobility  Outcome Measure Help needed turning from your back to your side while in a flat bed without using bedrails?: A Little Help needed moving from lying on your back to sitting on the side of a flat bed without using bedrails?: A Little Help needed moving to and from a bed to a chair (including a wheelchair)?: A Little Help needed standing up from a chair using your arms (e.g., wheelchair or bedside chair)?: A Little Help needed to walk in hospital room?: A Little Help needed climbing 3-5 steps with a railing? : A Little 6 Click Score: 18    End of Session Equipment Utilized During Treatment: Oxygen Activity Tolerance: Patient tolerated treatment well Patient left: in bed;with call bell/phone within reach;with bed alarm set Nurse Communication: Mobility status PT Visit Diagnosis: Unsteadiness on feet (R26.81);Muscle weakness (generalized) (M62.81)    Time: 1400-1415 PT Time Calculation (min) (ACUTE ONLY): 15 min   Charges:   PT Evaluation $PT Eval Low Complexity: 1 Low          Ostin Mathey, PT, GCS 12/06/22,2:25 PM

## 2022-12-06 NOTE — Progress Notes (Addendum)
PROGRESS NOTE  Ruben Green:[id number] DOB: [birth year] DOA: 12/05/2022 PCP: Barbette Reichmann, MD  HPI/Recap of past 24 hours: Ruben Green is a 89 y.o. male with medical history significant for CAD, COPD (has oxygen at home but does not use it), HTN, open AAA repair several years ago, BPH and carcinoma in situ of the bladder, currently on Keflex for Proteus UTI prescribed by his urologist, who presents to the ED with a 3-day history of lower abdominal pain 9 out of 10 at its worst, nausea with intermittent vomiting, abdominal bloating, constipation and absence of flatus.  Workup in the ED revealed SBO seen on CT scan.  NG tube placement was attempted however the patient did not tolerate.  12/06/2022: The patient was seen and examined at his bedside.  His wife was present in the room.  He had a large bowel movement earlier this morning.  Denies any abdominal pain or nausea.  Had no other complaints. Returned to the room, requested by the patient's nurse to come and speak with the patient's son.  Presented once again and updated the patient's son.  All questions answered to the best of my ability.  While at bedside, the patient's nurse informed me that the patient had another bowel movement.   Assessment/Plan: Principal Problem:   Small bowel obstruction (HCC) Active Problems:   History of AAA (abdominal aortic aneurysm) repair   Urinary tract infection due to Proteus   Leukocytosis   Acute renal failure superimposed on stage 3a chronic kidney disease (HCC)   Chronic diastolic CHF (congestive heart failure) (HCC)   COPD (chronic obstructive pulmonary disease) (HCC)   Essential (primary) hypertension   Coronary artery disease involving native coronary artery of native heart without angina pectoris   Carcinoma in situ of bladder   BPH (benign prostatic hyperplasia)   Ventral hernia  Small bowel obstruction (HCC) History of prior laparotomy (open AAA repair) NG tube was  not placed, the patient could not tolerate. Reported 2 bowel movements and flatulence since this morning. Start clear liquid diet Continue IV fluid Continue as needed IV antiemetics. Continue supportive care. Early mobilization PT OT evaluation Appreciate general surgery assistance.   Recent urinary tract infection due to Proteus Has been on Keflex since 6/13 UA negative for pyuria.  Antibiotics was not restarted.   Resolved, leukocytosis, likely reactive   Acute renal failure superimposed on stage 3a chronic kidney disease (HCC) Improving with IV fluid hydration Continue to avoid nephrotoxic agents Monitor urine output Repeat BMP in the morning.   Chronic diastolic CHF (congestive heart failure) (HCC) Euvolemic on exam. Closely monitor volume status while on IV fluid.  Abnormal twelve-lead EKG High-sensitivity troponin negative. Cardiology consulted. Monitor with remote telemetry.   COPD (chronic obstructive pulmonary disease) (HCC) Not exacerbated Continue home inhalers DuoNebs as needed   Essential (primary) hypertension Currently normotensive. Continue to monitor vital signs.   Ventral hernia CT showing ventral abdominal wall hernia containing nonobstructed transverse colon   Carcinoma in situ of bladder BPH No acute issues Followed by urology, last seen 6/13 Resume home tamsulosin.   Coronary artery disease involving native coronary artery of native heart without angina pectoris Resume home aspirin Resume home metoprolol once blood pressure is improved.       DVT prophylaxis: Subcu Lovenox daily.   Consults: Surgery, Dr. Claudine Mouton, cardiology.     Code Status: Full code.  Family Communication: Updated the patient's wife and son at bedside.  Disposition Plan: Likely will discharge to  home within the next 24 to 48 hours    Status is: Inpatient The patient requires at least 2 midnights for further evaluation and treatment of present  condition.    Objective: Vitals:   12/06/22 0630 12/06/22 0752 12/06/22 0940 12/06/22 1100  BP: 136/75  133/67 (!) 133/45  Pulse: 83  95 (!) 44  Resp: (!) 21  18 18   Temp:  98.1 F (36.7 C)    TempSrc:  Oral    SpO2: 93%  96% 94%  Weight:      Height:        Intake/Output Summary (Last 24 hours) at 12/06/2022 1211 Last data filed at 12/06/2022 1054 Gross per 24 hour  Intake 2000 ml  Output --  Net 2000 ml   Filed Weights   12/05/22 2324  Weight: 76.5 kg    Exam:  General: 89 y.o. year-old male well developed well nourished in no acute distress.  Somnolent. Cardiovascular: Regular rate and rhythm with no rubs or gallops.  No thyromegaly or JVD noted.   Respiratory: Clear to auscultation with no wheezes or rales. Good inspiratory effort. Abdomen: Soft nontender nondistended with hypoactive bowel sounds Musculoskeletal: No lower extremity edema. 2/4 pulses in all 4 extremities. Skin: No ulcerative lesions noted or rashes, Psychiatry: Mood is appropriate for condition and setting   Data Reviewed: CBC: Recent Labs  Lab 12/05/22 2330 12/06/22 1052  WBC 13.9* 7.0  HGB 13.9 12.3*  HCT 43.9 38.7*  MCV 97.1 99.2  PLT 231 166   Basic Metabolic Panel: Recent Labs  Lab 12/05/22 2330 12/06/22 1052  NA 134* 137  K 5.1 4.6  CL 94* 102  CO2 23 23  GLUCOSE 172* 116*  BUN 59* 54*  CREATININE 1.92* 1.51*  CALCIUM 9.6 8.3*  MG  --  2.1   GFR: Estimated Creatinine Clearance: 30.9 mL/min (A) (by C-G formula based on SCr of 1.51 mg/dL (H)). Liver Function Tests: Recent Labs  Lab 12/05/22 2330  AST 23  ALT 16  ALKPHOS 102  BILITOT 1.2  PROT 7.8  ALBUMIN 4.4   Recent Labs  Lab 12/05/22 2330  LIPASE 36   No results for input(s): "AMMONIA" in the last 168 hours. Coagulation Profile: No results for input(s): "INR", "PROTIME" in the last 168 hours. Cardiac Enzymes: No results for input(s): "CKTOTAL", "CKMB", "CKMBINDEX", "TROPONINI" in the last 168  hours. BNP (last 3 results) No results for input(s): "PROBNP" in the last 8760 hours. HbA1C: No results for input(s): "HGBA1C" in the last 72 hours. CBG: No results for input(s): "GLUCAP" in the last 168 hours. Lipid Profile: No results for input(s): "CHOL", "HDL", "LDLCALC", "TRIG", "CHOLHDL", "LDLDIRECT" in the last 72 hours. Thyroid Function Tests: No results for input(s): "TSH", "T4TOTAL", "FREET4", "T3FREE", "THYROIDAB" in the last 72 hours. Anemia Panel: No results for input(s): "VITAMINB12", "FOLATE", "FERRITIN", "TIBC", "IRON", "RETICCTPCT" in the last 72 hours. Urine analysis:    Component Value Date/Time   COLORURINE YELLOW (A) 12/06/2022 0402   APPEARANCEUR CLEAR (A) 12/06/2022 0402   APPEARANCEUR Hazy (A) 11/29/2022 1433   LABSPEC 1.026 12/06/2022 0402   PHURINE 6.0 12/06/2022 0402   GLUCOSEU NEGATIVE 12/06/2022 0402   HGBUR NEGATIVE 12/06/2022 0402   BILIRUBINUR NEGATIVE 12/06/2022 0402   BILIRUBINUR Negative 11/29/2022 1433   KETONESUR NEGATIVE 12/06/2022 0402   PROTEINUR NEGATIVE 12/06/2022 0402   NITRITE NEGATIVE 12/06/2022 0402   LEUKOCYTESUR NEGATIVE 12/06/2022 0402   Sepsis Labs: @LABRCNTIP (procalcitonin:4,lacticidven:4)  ) Recent Results (from the past 240 hour(s))  Microscopic Examination     Status: Abnormal   Collection Time: 11/29/22  2:33 PM   Urine  Result Value Ref Range Status   WBC, UA >30 (A) 0 - 5 /hpf Final   RBC, Urine 0-2 0 - 2 /hpf Final   Epithelial Cells (non renal) 0-10 0 - 10 /hpf Final   Bacteria, UA Moderate (A) None seen/Few Final  CULTURE, URINE COMPREHENSIVE     Status: Abnormal   Collection Time: 11/29/22  3:09 PM   Specimen: Urine   UR  Result Value Ref Range Status   Urine Culture, Comprehensive Final report (A)  Final   Organism ID, Bacteria Proteus mirabilis (A)  Final    Comment: Multi-Drug Resistant Organism Cefazolin <=4 ug/mL Cefazolin with an MIC <=16 predicts susceptibility to the oral agents cefaclor,  cefdinir, cefpodoxime, cefprozil, cefuroxime, cephalexin, and loracarbef when used for therapy of uncomplicated urinary tract infections due to E. coli, Klebsiella pneumoniae, and Proteus mirabilis. Greater than 100,000 colony forming units per mL    ANTIMICROBIAL SUSCEPTIBILITY Comment  Final    Comment:       ** S = Susceptible; I = Intermediate; R = Resistant **                    P = Positive; N = Negative             MICS are expressed in micrograms per mL    Antibiotic                 RSLT#1    RSLT#2    RSLT#3    RSLT#4 Amoxicillin/Clavulanic Acid    S Ampicillin                     R Cefepime                       S Ceftriaxone                    S Cefuroxime                     S Ertapenem                      S Gentamicin                     S Meropenem                      S Nitrofurantoin                 R Piperacillin/Tazobactam        S Tetracycline                   R Tobramycin                     S Trimethoprim/Sulfa             R       Studies: DG Abd Portable 1V  Result Date: 12/06/2022 CLINICAL DATA:  Small bowel obstruction EXAM: PORTABLE ABDOMEN - 1 VIEW COMPARISON:  Abdominal radiograph dated December 06, 2022; CT abdomen and pelvis dated December 06, 2022 FINDINGS: No gas-filled dilated loops of bowel. Known small-bowel obstruction not well visualized. Bibasilar atelectasis. Prior right hip replacement. No acute osseous abnormality. IMPRESSION: Known small-bowel obstruction not well visualized, likely  since dilated bowel loops are fluid-filled. Electronically Signed   By: Allegra Lai M.D.   On: 12/06/2022 11:27   DG Abdomen 1 View  Result Date: 12/06/2022 CLINICAL DATA:  Nasogastric tube placement EXAM: ABDOMEN - 1 VIEW COMPARISON:  Earlier today FINDINGS: Unchanged malpositioning of the enteric tube which loops at the lower esophagus which was fluid distended. No change in upper abdominal bowel gas pattern and mild atelectasis. Message sent in epic chat  concerning the findings. IMPRESSION: Unchanged malpositioning of the enteric tube which loops in the lower esophagus. Electronically Signed   By: Tiburcio Pea M.D.   On: 12/06/2022 05:35   DG Abdomen 1 View  Result Date: 12/06/2022 CLINICAL DATA:  Nasogastric tube placement EXAM: ABDOMEN - 1 VIEW COMPARISON:  Earlier today FINDINGS: An enteric tube loops through the lower esophagus with tip and side port at the mid chest. Linear opacities in the lower lungs attributed atelectasis. These results will be called to the ordering clinician or representative by the Radiologist Assistant, and communication documented in the PACS or [employer]. IMPRESSION: Malpositioned enteric tube which loops through the lower esophagus, tip at the mid chest. Electronically Signed   By: Tiburcio Pea M.D.   On: 12/06/2022 04:21   CT ABDOMEN PELVIS W CONTRAST  Result Date: 12/06/2022 CLINICAL DATA:  Eval SBO and pyelo. Acute nausea, vomiting, constipation, bloating EXAM: CT ABDOMEN AND PELVIS WITH CONTRAST TECHNIQUE: Multidetector CT imaging of the abdomen and pelvis was performed using the standard protocol following bolus administration of intravenous contrast. RADIATION DOSE REDUCTION: This exam was performed according to the departmental dose-optimization program which includes automated exposure control, adjustment of the mA and/or kV according to patient size and/or use of iterative reconstruction technique. CONTRAST:  75mL OMNIPAQUE IOHEXOL 300 MG/ML  SOLN COMPARISON:  CT pelvis 03/21/2021 and CT abdomen and pelvis 12/15/2018 FINDINGS: Lower chest: No acute abnormality. Hepatobiliary: No focal liver abnormality is seen. No gallstones, gallbladder wall thickening, or biliary dilatation. Pancreas: Unremarkable. Spleen: Unremarkable. Adrenals/Urinary Tract: Normal adrenal glands. Atrophy of the left kidney. Low-attenuation lesions in the kidneys are statistically likely to represent cysts. No follow-up is required.  No urinary calculi or hydronephrosis. Unremarkable bladder. Stomach/Bowel: Large hiatal hernia. Dilated small bowel in the central and left hemiabdomen fecalization of small bowel contents in right lower quadrant immediately upstream from an abrupt transition point best appreciated on sagittal image 51. The bowel downstream from the transition point is decompressed. Normal caliber colon without wall thickening. Herniation of the transverse colon into the midline ventral abdominal wall hernia without evidence of obstruction. Colonic diverticulosis without diverticulitis. Vascular/Lymphatic: Aortic atherosclerosis. No enlarged abdominal or pelvic lymph nodes. Aorto bi-iliac bypass graft. Reproductive: Unremarkable. Other: No free intraperitoneal fluid or air. Ventral abdominal wall hernia containing nonobstructed transverse colon. Additional fat containing umbilical hernia. Musculoskeletal: Right THA.  No acute fracture.  Demineralization. IMPRESSION: 1. Small-bowel obstruction with transition point in the right lower quadrant. 2. Ventral abdominal wall hernia containing nonobstructed transverse colon. 3. Large hiatal hernia. Aortic Atherosclerosis (ICD10-I70.0). Electronically Signed   By: Minerva Fester M.D.   On: 12/06/2022 00:41    Scheduled Meds:  enoxaparin (LOVENOX) injection  0.5 mg/kg Subcutaneous Q24H   [START ON 12/07/2022] fluticasone furoate-vilanterol  1 puff Inhalation Daily   And   [START ON 12/07/2022] umeclidinium bromide  1 puff Inhalation Daily    Continuous Infusions:  lactated ringers Stopped (12/06/22 1054)     LOS: 0 days     Time: 15 minutes.  Darlin Drop, MD [facility] Pager [phone]  If 7PM-7AM, please contact night-coverage [email or web address] Password [facility] 12/06/2022, 12:11 PM

## 2022-12-07 DIAGNOSIS — K56609 Unspecified intestinal obstruction, unspecified as to partial versus complete obstruction: Secondary | ICD-10-CM | POA: Diagnosis not present

## 2022-12-07 LAB — BASIC METABOLIC PANEL
Anion gap: 5 (ref 5–15)
BUN: 43 mg/dL — ABNORMAL HIGH (ref 8–23)
CO2: 27 mmol/L (ref 22–32)
Calcium: 8.1 mg/dL — ABNORMAL LOW (ref 8.9–10.3)
Chloride: 107 mmol/L (ref 98–111)
Creatinine, Ser: 1.33 mg/dL — ABNORMAL HIGH (ref 0.61–1.24)
GFR, Estimated: 52 mL/min — ABNORMAL LOW (ref >60)
Glucose, Bld: 102 mg/dL — ABNORMAL HIGH (ref 70–99)
Potassium: 4.4 mmol/L (ref 3.5–5.1)
Sodium: 139 mmol/L (ref 135–145)

## 2022-12-07 NOTE — Progress Notes (Signed)
[facility] SURGICAL PROGRESS NOTE (cpt [phone])  Hospital Day(s): 1.   Interval History: Patient seen and examined, no acute events or new complaints overnight. Patient reports he is feeling good this morning. Wife at bedside helps with history. No abdominal pain, fever, chills, nausea, emesis. Renal function back towards baseline; sCr - 1.33; UO - unmeasured. No significant electrolyte derangements. He was advanced to CLD; tolerating well. He is passing gas and had more documented BM. No issues with ambulation    Review of Systems:  Constitutional: denies fever, chills  HEENT: denies cough or congestion  Respiratory: denies any shortness of breath  Cardiovascular: denies chest pain or palpitations  Gastrointestinal: denies abdominal pain, N/V Genitourinary: denies burning with urination or urinary frequency Musculoskeletal: denies pain, decreased motor or sensation  Vital signs in last 24 hours: [min-max] current  Temp:  [97.7 F (36.5 C)-98.3 F (36.8 C)] 98.3 F (36.8 C) (06/21 0350) Pulse Rate:  [40-95] 87 (06/21 0350) Resp:  [18-21] 18 (06/21 0350) BP: (116-133)/(42-69) 117/69 (06/21 0350) SpO2:  [90 %-96 %] 90 % (06/21 0350) Weight:  [76.4 kg-76.5 kg] 76.4 kg (06/21 0350)     Height: 5\' 2"  (157.5 cm) Weight: 76.4 kg BMI (Calculated): 30.8   Intake/Output last 2 shifts:  06/20 0701 - 06/21 0700 In: 1600 [I.V.:1600] Out: -    Physical Exam:  Constitutional: alert, cooperative and no distress  HENT: normocephalic without obvious abnormality  Eyes: PERRL, EOM's grossly intact and symmetric  Respiratory: breathing non-labored at rest  Cardiovascular: regular rate and sinus rhythm  Gastrointestinal: soft, non-tender, and non-distended, no rebound/guarding Musculoskeletal: no edema or wounds, motor and sensation grossly intact, NT    Labs:     Latest Ref Rng & Units 12/06/2022   10:52 AM 12/05/2022   11:30 PM 09/10/2022    3:29 PM  CBC  WBC 4.0 - 10.5 K/uL  7.0  13.9  7.3   Hemoglobin 13.0 - 17.0 g/dL 60.4  54.0  98.1   Hematocrit 39.0 - 52.0 % 38.7  43.9  37.8   Platelets 150 - 400 K/uL 166  231  202       Latest Ref Rng & Units 12/07/2022    4:38 AM 12/06/2022   10:52 AM 12/05/2022   11:30 PM  CMP  Glucose 70 - 99 mg/dL 191  478  295   BUN 8 - 23 mg/dL 43  54  59   Creatinine 0.61 - 1.24 mg/dL 6.21  3.08  6.57   Sodium 135 - 145 mmol/L 139  137  134   Potassium 3.5 - 5.1 mmol/L 4.4  4.6  5.1   Chloride 98 - 111 mmol/L 107  102  94   CO2 22 - 32 mmol/L 27  23  23    Calcium 8.9 - 10.3 mg/dL 8.1  8.3  9.6   Total Protein 6.5 - 8.1 g/dL   7.8   Total Bilirubin 0.3 - 1.2 mg/dL   1.2   Alkaline Phos 38 - 126 U/L   102   AST 15 - 41 U/L   23   ALT 0 - 44 U/L   16      Imaging studies: No new pertinent imaging studies   Assessment/Plan: (ICD-10's: K32.609) 89 y.o. male with clinically improved small bowel obstruction secondary to post-surgical adhesive disease               - Advance diet as tolerated today             -  No role for surgical intervention at this time; hernias soft and not source of obstruction             - Monitor abdominal examination; on-going bowel function              - Pain control prn (limit narcotics); antiemetics prn - Monitor renal function - Mobilize               - Further management per primary service   - Discharge Planning; SBO appears clinically resolved, having bowel function. Okay to advance diet today. If tolerates this, anticipate okay for discharge late this afternoon -vs- tomorrow (06/22). Nothing further from our perspective. Please call if clinical condition changes.   All of the above findings and recommendations were discussed with the patient, patient's family (wife at bedside), and the medical team, and all of patient's and family's questions were answered to their expressed satisfaction.  -- Lynden Oxford, PA-C [facility] 12/07/2022, 7:27 AM M-F: 7am - 4pm

## 2022-12-07 NOTE — Discharge Summary (Signed)
Discharge Summary  Ruben Green YQM:[id number] DOB: 1933-10-21  PCP: Barbette Reichmann, MD  Admit date: 12/05/2022 Discharge date: 12/07/2022  Time spent: 35 minutes   Recommendations for Outpatient Follow-up:  Follow up with your PCP in 1-2 weeks   Discharge Diagnoses:  Active Hospital Problems   Diagnosis Date Noted   Small bowel obstruction (HCC) 12/06/2022    Priority: 1.   History of AAA (abdominal aortic aneurysm) repair 12/06/2022    Priority: 2.   Urinary tract infection due to Proteus 08/08/2021    Priority: 3.   Leukocytosis 12/06/2022    Priority: 4.   Acute renal failure superimposed on stage 3a chronic kidney disease (HCC) 08/08/2021    Priority: 5.   Chronic diastolic CHF (congestive heart failure) (HCC) 12/23/2021    Priority: 6.   COPD (chronic obstructive pulmonary disease) (HCC) 11/08/2015    Priority: 7.   Essential (primary) hypertension 11/08/2015    Priority: 8.   BPH (benign prostatic hyperplasia) 12/06/2022   Ventral hernia 12/06/2022   Carcinoma in situ of bladder 03/05/2020   Coronary artery disease involving native coronary artery of native heart without angina pectoris 05/30/2016    Resolved Hospital Problems  No resolved problems to display.    Discharge Condition: Stable   Diet recommendation: Resume previous diet   Vitals:   12/07/22 0350 12/07/22 0827  BP: 117/69 122/87  Pulse: 87 91  Resp: 18 18  Temp: 98.3 F (36.8 C) 98 F (36.7 C)  SpO2: 90% 90%    History of present illness:  Ruben Green is a 89 y.o. male with medical history significant for CAD, COPD (has oxygen at home but does not use it), HTN, open AAA repair several years ago, BPH and carcinoma in situ of the bladder, currently on Keflex for Proteus UTI prescribed by his urologist, who presents to the ED with a 3-day history of lower abdominal pain 9 out of 10 at its worst.  Associated with nausea, intermittent vomiting, abdominal bloating, constipation  and absence of flatus.  Workup in the ED revealed SBO seen on CT scan.  NG tube placement was attempted however the patient did not tolerate.   Had some bowel movements on 6/20 and 6/21.  Tolerated a clear liquid which was advanced to full liquid, and then to soft diet.  Seen by general surgery, signed off, as SBO has clinically resolved.Marland Kitchen  Hospital Course:  Principal Problem:   Small bowel obstruction (HCC) Active Problems:   History of AAA (abdominal aortic aneurysm) repair   Urinary tract infection due to Proteus   Leukocytosis   Acute renal failure superimposed on stage 3a chronic kidney disease (HCC)   Chronic diastolic CHF (congestive heart failure) (HCC)   COPD (chronic obstructive pulmonary disease) (HCC)   Essential (primary) hypertension   Coronary artery disease involving native coronary artery of native heart without angina pectoris   Carcinoma in situ of bladder   BPH (benign prostatic hyperplasia)   Ventral hernia  Resolved Small bowel obstruction (HCC) History of prior laparotomy (open AAA repair)   Recent urinary tract infection due to Proteus Has been on Keflex since 6/13 UA negative for pyuria.  Antibiotics was not restarted.   Resolved, leukocytosis, likely reactive   Acute renal failure superimposed on stage 3a chronic kidney disease (HCC) Improving with IV fluid hydration Continue to avoid nephrotoxic agents   Chronic diastolic CHF (congestive heart failure) (HCC) Euvolemic on exam.   COPD (chronic obstructive pulmonary disease) (HCC) Stable  Essential (primary) hypertension Currently normotensive.   Ventral hernia CT showing ventral abdominal wall hernia containing nonobstructed transverse colon   Carcinoma in situ of bladder BPH No acute issues Followed by urology, last seen 6/13 Resume home tamsulosin.   Coronary artery disease involving native coronary artery of native heart without angina pectoris Resume home aspirin Resume home  metoprolol once blood pressure is improved.       Consults: Surgery, Dr. Claudine Mouton      Discharge Exam: BP 122/87 (BP Location: Left Arm)   Pulse 91   Temp 98 F (36.7 C)   Resp 18   Ht 5\' 2"  (1.575 m)   Wt 76.4 kg   SpO2 90%   BMI 30.81 kg/m  General: 89 y.o. year-old male well developed well nourished in no acute distress.  Alert and oriented x3. Cardiovascular: Regular rate and rhythm with no rubs or gallops.  No thyromegaly or JVD noted.   Respiratory: Clear to auscultation with no wheezes or rales. Good inspiratory effort. Abdomen: Soft nontender nondistended with normal bowel sounds x4 quadrants. Musculoskeletal: No lower extremity edema. 2/4 pulses in all 4 extremities. Skin: No ulcerative lesions noted or rashes, Psychiatry: Mood is appropriate for condition and setting  Discharge Instructions You were cared for by a hospitalist during your hospital stay. If you have any questions about your discharge medications or the care you received while you were in the hospital after you are discharged, you can call the unit and asked to speak with the hospitalist on call if the hospitalist that took care of you is not available. Once you are discharged, your primary care physician will handle any further medical issues. Please note that NO REFILLS for any discharge medications will be authorized once you are discharged, as it is imperative that you return to your primary care physician (or establish a relationship with a primary care physician if you do not have one) for your aftercare needs so that they can reassess your need for medications and monitor your lab values.   Allergies as of 12/07/2022       Reactions   Oxycodone Other (See Comments)   Caused hallucinations/does not want anymore        Medication List     STOP taking these medications    amLODipine 10 MG tablet Commonly known as: NORVASC   cephALEXin 500 MG capsule Commonly known as: Keflex       TAKE  these medications    acetaminophen 500 MG tablet Commonly known as: TYLENOL Take 1-2 tablets (500-1,000 mg total) by mouth every 6 (six) hours as needed for mild pain.   aspirin EC 81 MG tablet Take 1 tablet (81 mg total) by mouth daily. Swallow whole.   cyanocobalamin 1000 MCG tablet Commonly known as: VITAMIN B12 Take 1,000 mcg by mouth daily.   iron polysaccharides 150 MG capsule Commonly known as: NIFEREX Take 150 mg by mouth 2 (two) times daily.   LORazepam 0.5 MG tablet Commonly known as: ATIVAN Take 0.5 mg by mouth daily as needed for anxiety.   metoprolol succinate 50 MG 24 hr tablet Commonly known as: TOPROL-XL Take 1 tablet (50 mg total) by mouth daily. Take with or immediately following a meal.   tamsulosin 0.4 MG Caps capsule Commonly known as: FLOMAX Take 0.4 mg by mouth daily.   Trelegy Ellipta 100-62.5-25 MCG/ACT Aepb Generic drug: Fluticasone-Umeclidin-Vilant Inhale 2 puffs into the lungs daily.       Allergies  Allergen Reactions  Oxycodone Other (See Comments)    Caused hallucinations/does not want anymore    Follow-up Information     Alwyn Pea, MD Follow up on 12/11/2022.   Specialties: Cardiology, Internal Medicine Why: Go at 10:00am. You will see Dr. Daun Peacock. Contact information: [address] [place] [address] [phone]         Barbette Reichmann, MD. Call on 12/13/2022.   Specialty: Internal Medicine Why: Please call for a post hospital follow up appointment. Go at 8:45am. Contact information: [address] [place] [address] [phone]         [facility] Follow up.   Why: They will follow up with you for your home health therapy needs.                 The results of significant diagnostics from this hospitalization (including imaging, microbiology, ancillary and laboratory) are listed below for reference.    Significant Diagnostic Studies: DG  Abd Portable 1V  Result Date: 12/06/2022 CLINICAL DATA:  Small bowel obstruction EXAM: PORTABLE ABDOMEN - 1 VIEW COMPARISON:  Abdominal radiograph dated December 06, 2022; CT abdomen and pelvis dated December 06, 2022 FINDINGS: No gas-filled dilated loops of bowel. Known small-bowel obstruction not well visualized. Bibasilar atelectasis. Prior right hip replacement. No acute osseous abnormality. IMPRESSION: Known small-bowel obstruction not well visualized, likely since dilated bowel loops are fluid-filled. Electronically Signed   By: Allegra Lai M.D.   On: 12/06/2022 11:27   DG Abdomen 1 View  Result Date: 12/06/2022 CLINICAL DATA:  Nasogastric tube placement EXAM: ABDOMEN - 1 VIEW COMPARISON:  Earlier today FINDINGS: Unchanged malpositioning of the enteric tube which loops at the lower esophagus which was fluid distended. No change in upper abdominal bowel gas pattern and mild atelectasis. Message sent in epic chat concerning the findings. IMPRESSION: Unchanged malpositioning of the enteric tube which loops in the lower esophagus. Electronically Signed   By: Tiburcio Pea M.D.   On: 12/06/2022 05:35   DG Abdomen 1 View  Result Date: 12/06/2022 CLINICAL DATA:  Nasogastric tube placement EXAM: ABDOMEN - 1 VIEW COMPARISON:  Earlier today FINDINGS: An enteric tube loops through the lower esophagus with tip and side port at the mid chest. Linear opacities in the lower lungs attributed atelectasis. These results will be called to the ordering clinician or representative by the Radiologist Assistant, and communication documented in the PACS or [employer]. IMPRESSION: Malpositioned enteric tube which loops through the lower esophagus, tip at the mid chest. Electronically Signed   By: Tiburcio Pea M.D.   On: 12/06/2022 04:21   CT ABDOMEN PELVIS W CONTRAST  Result Date: 12/06/2022 CLINICAL DATA:  Eval SBO and pyelo. Acute nausea, vomiting, constipation, bloating EXAM: CT ABDOMEN AND PELVIS WITH  CONTRAST TECHNIQUE: Multidetector CT imaging of the abdomen and pelvis was performed using the standard protocol following bolus administration of intravenous contrast. RADIATION DOSE REDUCTION: This exam was performed according to the departmental dose-optimization program which includes automated exposure control, adjustment of the mA and/or kV according to patient size and/or use of iterative reconstruction technique. CONTRAST:  75mL OMNIPAQUE IOHEXOL 300 MG/ML  SOLN COMPARISON:  CT pelvis 03/21/2021 and CT abdomen and pelvis 12/15/2018 FINDINGS: Lower chest: No acute abnormality. Hepatobiliary: No focal liver abnormality is seen. No gallstones, gallbladder wall thickening, or biliary dilatation. Pancreas: Unremarkable. Spleen: Unremarkable. Adrenals/Urinary Tract: Normal adrenal glands. Atrophy of the left kidney. Low-attenuation lesions in the kidneys are statistically likely to represent  cysts. No follow-up is required. No urinary calculi or hydronephrosis. Unremarkable bladder. Stomach/Bowel: Large hiatal hernia. Dilated small bowel in the central and left hemiabdomen fecalization of small bowel contents in right lower quadrant immediately upstream from an abrupt transition point best appreciated on sagittal image 51. The bowel downstream from the transition point is decompressed. Normal caliber colon without wall thickening. Herniation of the transverse colon into the midline ventral abdominal wall hernia without evidence of obstruction. Colonic diverticulosis without diverticulitis. Vascular/Lymphatic: Aortic atherosclerosis. No enlarged abdominal or pelvic lymph nodes. Aorto bi-iliac bypass graft. Reproductive: Unremarkable. Other: No free intraperitoneal fluid or air. Ventral abdominal wall hernia containing nonobstructed transverse colon. Additional fat containing umbilical hernia. Musculoskeletal: Right THA.  No acute fracture.  Demineralization. IMPRESSION: 1. Small-bowel obstruction with transition  point in the right lower quadrant. 2. Ventral abdominal wall hernia containing nonobstructed transverse colon. 3. Large hiatal hernia. Aortic Atherosclerosis (ICD10-I70.0). Electronically Signed   By: Minerva Fester M.D.   On: 12/06/2022 00:41    Microbiology: Recent Results (from the past 240 hour(s))  Microscopic Examination     Status: Abnormal   Collection Time: 11/29/22  2:33 PM   Urine  Result Value Ref Range Status   WBC, UA >30 (A) 0 - 5 /hpf Final   RBC, Urine 0-2 0 - 2 /hpf Final   Epithelial Cells (non renal) 0-10 0 - 10 /hpf Final   Bacteria, UA Moderate (A) None seen/Few Final  CULTURE, URINE COMPREHENSIVE     Status: Abnormal   Collection Time: 11/29/22  3:09 PM   Specimen: Urine   UR  Result Value Ref Range Status   Urine Culture, Comprehensive Final report (A)  Final   Organism ID, Bacteria Proteus mirabilis (A)  Final    Comment: Multi-Drug Resistant Organism Cefazolin <=4 ug/mL Cefazolin with an MIC <=16 predicts susceptibility to the oral agents cefaclor, cefdinir, cefpodoxime, cefprozil, cefuroxime, cephalexin, and loracarbef when used for therapy of uncomplicated urinary tract infections due to E. coli, Klebsiella pneumoniae, and Proteus mirabilis. Greater than 100,000 colony forming units per mL    ANTIMICROBIAL SUSCEPTIBILITY Comment  Final    Comment:       ** S = Susceptible; I = Intermediate; R = Resistant **                    P = Positive; N = Negative             MICS are expressed in micrograms per mL    Antibiotic                 RSLT#1    RSLT#2    RSLT#3    RSLT#4 Amoxicillin/Clavulanic Acid    S Ampicillin                     R Cefepime                       S Ceftriaxone                    S Cefuroxime                     S Ertapenem                      S Gentamicin  S Meropenem                      S Nitrofurantoin                 R Piperacillin/Tazobactam        S Tetracycline                   R Tobramycin                      S Trimethoprim/Sulfa             R      Labs: Basic Metabolic Panel: Recent Labs  Lab 12/05/22 2330 12/06/22 1052 12/07/22 0438  NA 134* 137 139  K 5.1 4.6 4.4  CL 94* 102 107  CO2 23 23 27   GLUCOSE 172* 116* 102*  BUN 59* 54* 43*  CREATININE 1.92* 1.51* 1.33*  CALCIUM 9.6 8.3* 8.1*  MG  --  2.1  --    Liver Function Tests: Recent Labs  Lab 12/05/22 2330  AST 23  ALT 16  ALKPHOS 102  BILITOT 1.2  PROT 7.8  ALBUMIN 4.4   Recent Labs  Lab 12/05/22 2330  LIPASE 36   No results for input(s): "AMMONIA" in the last 168 hours. CBC: Recent Labs  Lab 12/05/22 2330 12/06/22 1052  WBC 13.9* 7.0  HGB 13.9 12.3*  HCT 43.9 38.7*  MCV 97.1 99.2  PLT 231 166   Cardiac Enzymes: No results for input(s): "CKTOTAL", "CKMB", "CKMBINDEX", "TROPONINI" in the last 168 hours. BNP: BNP (last 3 results) Recent Labs    12/23/21 0140  BNP 415.5*    ProBNP (last 3 results) No results for input(s): "PROBNP" in the last 8760 hours.  CBG: No results for input(s): "GLUCAP" in the last 168 hours.     Signed:  Darlin Drop, MD [facility] 12/07/2022, 6:16 PM

## 2022-12-07 NOTE — TOC Initial Note (Signed)
Transition of Care [facility]) - Initial/Assessment Note    Patient Details  Name: Ruben Green MRN: [id number] Date of Birth: 09-Mar-1934  Transition of Care [facility]) CM/SW Contact:    Margarito Liner, LCSW Phone Number: 12/07/2022, 10:45 AM  Clinical Narrative: CSW met with patient. Wife at bedside. Wife said no [place] interpreter needed. CSW introduced role and explained that therapy recommendations would be discussed. They are agreeable to home health therapy. Patient has worked with [facility] in the past. Left message for liaison to see if they can accept again. If not, will check other agencies.                  Expected Discharge Plan: Home w Home Health Services Barriers to Discharge: Other (must enter comment) (Home health setup)   Patient Goals and CMS Choice            Expected Discharge Plan and Services     Post Acute Care Choice: Home Health Living arrangements for the past 2 months: Single Family Home Expected Discharge Date: 12/07/22                                    Prior Living Arrangements/Services Living arrangements for the past 2 months: Single Family Home Lives with:: Spouse Patient language and need for interpreter reviewed:: Yes Do you feel safe going back to the place where you live?: Yes      Need for Family Participation in Patient Care: Yes (Comment) Care giver support system in place?: Yes (comment)   Criminal Activity/Legal Involvement Pertinent to Current Situation/Hospitalization: No - Comment as needed  Activities of Daily Living Home Assistive Devices/Equipment: Oxygen ADL Screening (condition at time of admission) Patient's cognitive ability adequate to safely complete daily activities?: Yes Is the patient deaf or have difficulty hearing?: No Does the patient have difficulty seeing, even when wearing glasses/contacts?: No Does the patient have difficulty concentrating, remembering, or making decisions?: No Patient able to express  need for assistance with ADLs?: Yes Does the patient have difficulty dressing or bathing?: No Independently performs ADLs?: Yes (appropriate for developmental age) Does the patient have difficulty walking or climbing stairs?: No Weakness of Legs: None Weakness of Arms/Hands: None  Permission Sought/Granted Permission sought to share information with : Facility Medical sales representative, Family Supports Permission granted to share information with : Yes, Verbal Permission Granted  Share Information with NAME: Ruben Green  Permission granted to share info w AGENCY: Home Health Agencies  Permission granted to share info w Relationship: Wife  Permission granted to share info w Contact Information: [phone]  Emotional Assessment Appearance:: Appears stated age Attitude/Demeanor/Rapport: Engaged Affect (typically observed): Accepting, Appropriate, Calm, Pleasant Orientation: : Oriented to Self, Oriented to Place, Oriented to  Time, Oriented to Situation Alcohol / Substance Use: Not Applicable Psych Involvement: No (comment)  Admission diagnosis:  Small bowel obstruction (HCC) [K56.609] Patient Active Problem List   Diagnosis Date Noted   Small bowel obstruction (HCC) 12/06/2022   BPH (benign prostatic hyperplasia) 12/06/2022   History of AAA (abdominal aortic aneurysm) repair 12/06/2022   Leukocytosis 12/06/2022   Ventral hernia 12/06/2022   Anemia due to chronic kidney disease 02/09/2022   Iron deficiency anemia due to chronic blood loss 02/08/2022   Chronic kidney disease 02/08/2022   Sepsis (HCC) 12/23/2021   Chronic diastolic CHF (congestive heart failure) (HCC) 12/23/2021   Respiratory tract infection 12/23/2021   NSTEMI (  non-ST elevated myocardial infarction) (HCC) 12/23/2021   Acute renal failure superimposed on stage 3a chronic kidney disease (HCC) 08/08/2021   Hyponatremia 08/08/2021   Urinary tract infection due to Proteus 08/08/2021   Anemia 08/08/2021    Bilateral pneumonia 08/08/2021   Severe sepsis (HCC) 08/08/2021   Acute respiratory failure with hypoxia (HCC) 08/08/2021   Bilateral pleural effusion 08/08/2021   Elevated troponin 08/08/2021   On continuous oral anticoagulation 08/08/2021   Status post right hip replacement 08/01/21 08/04/2021   Status post total hip replacement, right 08/01/2021   Carcinoma in situ of bladder 03/05/2020   Prostate cancer (HCC) 07/03/2017   Endophthalmitis, acute, right 02/23/2017   Coronary artery disease involving native coronary artery of native heart without angina pectoris 05/30/2016   Benign fibroma of prostate 11/08/2015   COPD (chronic obstructive pulmonary disease) (HCC) 11/08/2015   Essential (primary) hypertension 11/08/2015   Combined fat and carbohydrate induced hyperlipemia 11/08/2015   Low serum cobalamin 04/05/2015   Elevated PSA 02/08/2015   BPH with obstruction/lower urinary tract symptoms 02/08/2015   Erectile dysfunction of organic origin 02/08/2015   Anxiety 08/24/2014   Abdominal aortic aneurysm (AAA) without rupture 05/20/2014   Carotid artery plaque 05/20/2014   Breathlessness on exertion 05/20/2014   Aneurysm of abdominal vessel (HCC) 05/20/2014   Anxiety, generalized 04/20/2014   Benign prostatic hyperplasia with urinary obstruction 10/27/2012   PCP:  Barbette Reichmann, MD Pharmacy:   [facility] - [place], [place] - [address] [place] [address] Phone: [phone] Fax: [phone]  [id number] [place], [place] - [address] AT [facility] OF [address] [place] [address] Phone: [phone] Fax: [phone]     Social Determinants of Health (SDOH) Social History: SDOH Screenings   Food Insecurity: No Food Insecurity (12/06/2022)  Housing: Low Risk  (12/06/2022)  Transportation Needs: No Transportation Needs (12/06/2022)  Utilities: Not At Risk (12/06/2022)   Tobacco Use: Medium Risk (12/06/2022)   SDOH Interventions:     Readmission Risk Interventions     No data to display

## 2022-12-07 NOTE — TOC Transition Note (Signed)
Transition of Care [facility]) - CM/SW Discharge Note   Patient Details  Name: MENELIK MCFARREN MRN: [id number] Date of Birth: Jul 23, 1933  Transition [facility] Care [facility]) CM/SW Contact:  Margarito Liner, LCSW Phone Number: 12/07/2022, 11:15 AM   Clinical Narrative:  Patient has orders to discharge home today. Iantha Fallen has accepted referral for PT and OT. Wife is aware. No further concerns. CSW signing off.  Final next level of care: Home w Home Health Services Barriers to Discharge: Barriers Resolved   Patient Goals and CMS Choice   Choice offered to / list presented to : Spouse  Discharge Placement                  Patient to be transferred to facility by: Wife Name of family member notified: Zymire Turnbo Patient and family notified of of transfer: 12/07/22  Discharge Plan and Services Additional resources added to the After Visit Summary for       Post Acute Care Choice: Home Health                    HH Arranged: PT, OT [facility] Agency: [facility] Date [facility] Agency Contacted: 12/07/22   Representative spoke with at [facility] Agency: Mayra Reel  Social Determinants of Health (SDOH) Interventions SDOH Screenings   Food Insecurity: No Food Insecurity (12/06/2022)  Housing: Low Risk  (12/06/2022)  Transportation Needs: No Transportation Needs (12/06/2022)  Utilities: Not At Risk (12/06/2022)  Tobacco Use: Medium Risk (12/06/2022)     Readmission Risk Interventions    12/07/2022   10:47 AM  Readmission Risk Prevention Plan  PCP or Specialist Appt within 3-5 Days Complete  HRI or Home Care Consult Complete  Social Work Consult for Recovery Care Planning/Counseling Complete  Palliative Care Screening Not Applicable

## 2022-12-11 DIAGNOSIS — I5032 Chronic diastolic (congestive) heart failure: Secondary | ICD-10-CM | POA: Diagnosis not present

## 2022-12-11 DIAGNOSIS — I10 Essential (primary) hypertension: Secondary | ICD-10-CM | POA: Diagnosis not present

## 2022-12-11 DIAGNOSIS — E782 Mixed hyperlipidemia: Secondary | ICD-10-CM | POA: Diagnosis not present

## 2022-12-11 DIAGNOSIS — I6523 Occlusion and stenosis of bilateral carotid arteries: Secondary | ICD-10-CM | POA: Diagnosis not present

## 2022-12-11 DIAGNOSIS — I38 Endocarditis, valve unspecified: Secondary | ICD-10-CM | POA: Diagnosis not present

## 2022-12-11 DIAGNOSIS — R0609 Other forms of dyspnea: Secondary | ICD-10-CM | POA: Diagnosis not present

## 2022-12-11 DIAGNOSIS — I2510 Atherosclerotic heart disease of native coronary artery without angina pectoris: Secondary | ICD-10-CM | POA: Diagnosis not present

## 2022-12-13 DIAGNOSIS — I10 Essential (primary) hypertension: Secondary | ICD-10-CM | POA: Diagnosis not present

## 2022-12-13 DIAGNOSIS — C61 Malignant neoplasm of prostate: Secondary | ICD-10-CM | POA: Diagnosis not present

## 2022-12-13 DIAGNOSIS — Z8719 Personal history of other diseases of the digestive system: Secondary | ICD-10-CM | POA: Diagnosis not present

## 2022-12-13 DIAGNOSIS — Z09 Encounter for follow-up examination after completed treatment for conditions other than malignant neoplasm: Secondary | ICD-10-CM | POA: Diagnosis not present

## 2022-12-14 ENCOUNTER — Encounter: Payer: Self-pay | Admitting: Oncology

## 2023-01-04 DIAGNOSIS — D090 Carcinoma in situ of bladder: Secondary | ICD-10-CM | POA: Diagnosis not present

## 2023-01-04 DIAGNOSIS — H44001 Unspecified purulent endophthalmitis, right eye: Secondary | ICD-10-CM | POA: Diagnosis not present

## 2023-01-04 DIAGNOSIS — Z96641 Presence of right artificial hip joint: Secondary | ICD-10-CM | POA: Diagnosis not present

## 2023-01-04 DIAGNOSIS — C61 Malignant neoplasm of prostate: Secondary | ICD-10-CM | POA: Diagnosis not present

## 2023-01-07 DIAGNOSIS — J438 Other emphysema: Secondary | ICD-10-CM | POA: Diagnosis not present

## 2023-01-07 DIAGNOSIS — Z125 Encounter for screening for malignant neoplasm of prostate: Secondary | ICD-10-CM | POA: Diagnosis not present

## 2023-01-07 DIAGNOSIS — H353221 Exudative age-related macular degeneration, left eye, with active choroidal neovascularization: Secondary | ICD-10-CM | POA: Diagnosis not present

## 2023-01-07 DIAGNOSIS — J3489 Other specified disorders of nose and nasal sinuses: Secondary | ICD-10-CM | POA: Diagnosis not present

## 2023-01-07 DIAGNOSIS — J209 Acute bronchitis, unspecified: Secondary | ICD-10-CM | POA: Diagnosis not present

## 2023-01-07 DIAGNOSIS — F419 Anxiety disorder, unspecified: Secondary | ICD-10-CM | POA: Diagnosis not present

## 2023-01-07 DIAGNOSIS — R7309 Other abnormal glucose: Secondary | ICD-10-CM | POA: Diagnosis not present

## 2023-01-07 DIAGNOSIS — D649 Anemia, unspecified: Secondary | ICD-10-CM | POA: Diagnosis not present

## 2023-01-07 DIAGNOSIS — E782 Mixed hyperlipidemia: Secondary | ICD-10-CM | POA: Diagnosis not present

## 2023-01-07 DIAGNOSIS — E538 Deficiency of other specified B group vitamins: Secondary | ICD-10-CM | POA: Diagnosis not present

## 2023-01-07 DIAGNOSIS — Z0000 Encounter for general adult medical examination without abnormal findings: Secondary | ICD-10-CM | POA: Diagnosis not present

## 2023-01-14 DIAGNOSIS — Z23 Encounter for immunization: Secondary | ICD-10-CM | POA: Diagnosis not present

## 2023-01-14 DIAGNOSIS — Z6830 Body mass index (BMI) 30.0-30.9, adult: Secondary | ICD-10-CM | POA: Diagnosis not present

## 2023-01-14 DIAGNOSIS — I10 Essential (primary) hypertension: Secondary | ICD-10-CM | POA: Diagnosis not present

## 2023-01-14 DIAGNOSIS — D090 Carcinoma in situ of bladder: Secondary | ICD-10-CM | POA: Diagnosis not present

## 2023-01-14 DIAGNOSIS — D649 Anemia, unspecified: Secondary | ICD-10-CM | POA: Diagnosis not present

## 2023-01-14 DIAGNOSIS — C61 Malignant neoplasm of prostate: Secondary | ICD-10-CM | POA: Diagnosis not present

## 2023-01-14 DIAGNOSIS — E538 Deficiency of other specified B group vitamins: Secondary | ICD-10-CM | POA: Diagnosis not present

## 2023-01-14 DIAGNOSIS — J449 Chronic obstructive pulmonary disease, unspecified: Secondary | ICD-10-CM | POA: Diagnosis not present

## 2023-01-17 DIAGNOSIS — M1711 Unilateral primary osteoarthritis, right knee: Secondary | ICD-10-CM | POA: Diagnosis not present

## 2023-01-18 DIAGNOSIS — M1711 Unilateral primary osteoarthritis, right knee: Secondary | ICD-10-CM | POA: Diagnosis not present

## 2023-02-11 DIAGNOSIS — H353221 Exudative age-related macular degeneration, left eye, with active choroidal neovascularization: Secondary | ICD-10-CM | POA: Diagnosis not present

## 2023-03-04 ENCOUNTER — Encounter: Payer: Self-pay | Admitting: Urology

## 2023-03-04 ENCOUNTER — Ambulatory Visit: Payer: PPO | Admitting: Urology

## 2023-03-04 VITALS — BP 138/74 | HR 78 | Ht 65.0 in | Wt 155.0 lb

## 2023-03-04 DIAGNOSIS — Z8551 Personal history of malignant neoplasm of bladder: Secondary | ICD-10-CM

## 2023-03-04 DIAGNOSIS — R300 Dysuria: Secondary | ICD-10-CM | POA: Diagnosis not present

## 2023-03-04 LAB — URINALYSIS, COMPLETE
Bilirubin, UA: NEGATIVE
Glucose, UA: NEGATIVE
Ketones, UA: NEGATIVE
Leukocytes,UA: NEGATIVE
Nitrite, UA: NEGATIVE
Protein,UA: NEGATIVE
RBC, UA: NEGATIVE
Specific Gravity, UA: 1.015 (ref 1.005–1.030)
Urobilinogen, Ur: 0.2 mg/dL (ref 0.2–1.0)
pH, UA: 5.5 (ref 5.0–7.5)

## 2023-03-04 LAB — MICROSCOPIC EXAMINATION: Bacteria, UA: NONE SEEN

## 2023-03-04 NOTE — Progress Notes (Signed)
   03/04/23  CC:  Chief Complaint  Patient presents with   Cysto   Indications: Carcinoma in situ bladder biopsies 02/23/2020 Completed 6 week course of BCG induction 06/13/2020  HPI: No complaints today; denies gross hematuria.  UA today with negative microscopy  Blood pressure 126/69, pulse 73, height 5\' 2"  (1.575 m), weight 175 lb (79.4 kg). NED. A&Ox3.   No respiratory distress   Abd soft, NT, ND Normal phallus with bilateral descended testicles  Cystoscopy Procedure Note  Patient identification was confirmed, informed consent was obtained, and patient was prepped using Betadine solution.  Lidocaine jelly was administered per urethral meatus.     Pre-Procedure: - Inspection reveals a normal caliber urethral meatus.  Procedure: The flexible cystoscope was introduced without difficulty - No urethral strictures/lesions are present. -  Moderate lateral lobe enlargement  prostate  - Elevated bladder neck, moderate - Bilateral ureteral orifices identified - Bladder mucosa  reveals no ulcers, tumors, or lesions - No bladder stones -Moderate trabeculation with cellules/diverticula   Retroflexion shows small intravesical prostatic tissue with hypervascularity  Post-Procedure: - Patient tolerated the procedure well  Assessment/ Plan: No bladder mucosal abnormalities/recurrent tumor identified Surveillance cystoscopy 6 months History high risk prostate cancer-PSA July 2024 was undetectable at <0.01    Riki Altes, MD

## 2023-03-07 DIAGNOSIS — I700 Atherosclerosis of aorta: Secondary | ICD-10-CM | POA: Diagnosis not present

## 2023-03-07 DIAGNOSIS — I6523 Occlusion and stenosis of bilateral carotid arteries: Secondary | ICD-10-CM | POA: Diagnosis not present

## 2023-03-07 DIAGNOSIS — J449 Chronic obstructive pulmonary disease, unspecified: Secondary | ICD-10-CM | POA: Diagnosis not present

## 2023-03-07 DIAGNOSIS — F411 Generalized anxiety disorder: Secondary | ICD-10-CM | POA: Diagnosis not present

## 2023-03-07 DIAGNOSIS — R0609 Other forms of dyspnea: Secondary | ICD-10-CM | POA: Diagnosis not present

## 2023-03-07 DIAGNOSIS — I2510 Atherosclerotic heart disease of native coronary artery without angina pectoris: Secondary | ICD-10-CM | POA: Diagnosis not present

## 2023-03-07 DIAGNOSIS — I10 Essential (primary) hypertension: Secondary | ICD-10-CM | POA: Diagnosis not present

## 2023-03-07 DIAGNOSIS — J9611 Chronic respiratory failure with hypoxia: Secondary | ICD-10-CM | POA: Diagnosis not present

## 2023-03-15 ENCOUNTER — Inpatient Hospital Stay: Payer: PPO | Attending: Oncology

## 2023-03-15 DIAGNOSIS — D500 Iron deficiency anemia secondary to blood loss (chronic): Secondary | ICD-10-CM | POA: Diagnosis not present

## 2023-03-15 LAB — IRON AND TIBC
Iron: 53 ug/dL (ref 45–182)
Saturation Ratios: 18 % (ref 17.9–39.5)
TIBC: 300 ug/dL (ref 250–450)
UIBC: 247 ug/dL

## 2023-03-15 LAB — CBC WITH DIFFERENTIAL (CANCER CENTER ONLY)
Abs Immature Granulocytes: 0.18 10*3/uL — ABNORMAL HIGH (ref 0.00–0.07)
Basophils Absolute: 0 10*3/uL (ref 0.0–0.1)
Basophils Relative: 1 %
Eosinophils Absolute: 0 10*3/uL (ref 0.0–0.5)
Eosinophils Relative: 0 %
HCT: 36.8 % — ABNORMAL LOW (ref 39.0–52.0)
Hemoglobin: 11.4 g/dL — ABNORMAL LOW (ref 13.0–17.0)
Immature Granulocytes: 2 %
Lymphocytes Relative: 8 %
Lymphs Abs: 0.6 10*3/uL — ABNORMAL LOW (ref 0.7–4.0)
MCH: 30.4 pg (ref 26.0–34.0)
MCHC: 31 g/dL (ref 30.0–36.0)
MCV: 98.1 fL (ref 80.0–100.0)
Monocytes Absolute: 1.1 10*3/uL — ABNORMAL HIGH (ref 0.1–1.0)
Monocytes Relative: 15 %
Neutro Abs: 5.6 10*3/uL (ref 1.7–7.7)
Neutrophils Relative %: 74 %
Platelet Count: 213 10*3/uL (ref 150–400)
RBC: 3.75 MIL/uL — ABNORMAL LOW (ref 4.22–5.81)
RDW: 13.1 % (ref 11.5–15.5)
WBC Count: 7.5 10*3/uL (ref 4.0–10.5)
nRBC: 0 % (ref 0.0–0.2)

## 2023-03-15 LAB — RETIC PANEL
Immature Retic Fract: 18.8 % — ABNORMAL HIGH (ref 2.3–15.9)
RBC.: 3.76 MIL/uL — ABNORMAL LOW (ref 4.22–5.81)
Retic Count, Absolute: 108.3 10*3/uL (ref 19.0–186.0)
Retic Ct Pct: 2.9 % (ref 0.4–3.1)
Reticulocyte Hemoglobin: 33.3 pg (ref >27.9)

## 2023-03-15 LAB — FERRITIN: Ferritin: 118 ng/mL (ref 24–336)

## 2023-03-21 ENCOUNTER — Inpatient Hospital Stay: Payer: PPO | Attending: Oncology | Admitting: Oncology

## 2023-03-21 ENCOUNTER — Inpatient Hospital Stay: Payer: PPO

## 2023-03-21 ENCOUNTER — Encounter: Payer: Self-pay | Admitting: Oncology

## 2023-03-21 VITALS — BP 128/68 | HR 85

## 2023-03-21 VITALS — BP 136/64 | HR 90 | Temp 97.4°F | Resp 18 | Wt 168.4 lb

## 2023-03-21 DIAGNOSIS — C61 Malignant neoplasm of prostate: Secondary | ICD-10-CM | POA: Diagnosis not present

## 2023-03-21 DIAGNOSIS — N189 Chronic kidney disease, unspecified: Secondary | ICD-10-CM | POA: Diagnosis not present

## 2023-03-21 DIAGNOSIS — D509 Iron deficiency anemia, unspecified: Secondary | ICD-10-CM | POA: Diagnosis not present

## 2023-03-21 DIAGNOSIS — R002 Palpitations: Secondary | ICD-10-CM | POA: Diagnosis not present

## 2023-03-21 DIAGNOSIS — D500 Iron deficiency anemia secondary to blood loss (chronic): Secondary | ICD-10-CM

## 2023-03-21 DIAGNOSIS — I130 Hypertensive heart and chronic kidney disease with heart failure and stage 1 through stage 4 chronic kidney disease, or unspecified chronic kidney disease: Secondary | ICD-10-CM | POA: Insufficient documentation

## 2023-03-21 DIAGNOSIS — D631 Anemia in chronic kidney disease: Secondary | ICD-10-CM | POA: Diagnosis not present

## 2023-03-21 MED ORDER — SODIUM CHLORIDE 0.9 % IV SOLN
INTRAVENOUS | Status: DC | PRN
  Filled 2023-03-21: qty 250

## 2023-03-21 MED ORDER — SODIUM CHLORIDE 0.9 % IV SOLN
200.0000 mg | Freq: Once | INTRAVENOUS | Status: AC
  Administered 2023-03-21: 200 mg via INTRAVENOUS
  Filled 2023-03-21: qty 200

## 2023-03-21 NOTE — Progress Notes (Signed)
Patient declined to wait the 30 minutes for post iron infusion observation today. Tolerated infusion well. VSS. 

## 2023-03-21 NOTE — Assessment & Plan Note (Signed)
Previously treated with radiation.   Recommend patient to continue follow-up with urology and radiation oncology.

## 2023-03-21 NOTE — Assessment & Plan Note (Signed)
negative multiple myeloma panel, light chain ratio. Venofer to further increase iron store.

## 2023-03-21 NOTE — Progress Notes (Signed)
Hematology/Oncology Consult note [phone] [phone]    CHIEF COMPLAINTS/REASON FOR VISIT:  Iron deficiency Anemia  ASSESSMENT & PLAN:  Iron deficiency anemia due to chronic blood loss He tolerates IV venofer treatments.  Lab Results  Component Value Date   HGB 11.4 (L) 03/15/2023   TIBC 300 03/15/2023   IRONPCTSAT 18 03/15/2023   FERRITIN 118 03/15/2023    Hemoglobin is slightly decreased.  Recommend IV Venofer 200mg  x 2  Prostate cancer (HCC) Previously treated with radiation.  Recommend patient to continue follow-up with urology and radiation oncology.  Anemia due to chronic kidney disease negative multiple myeloma panel, light chain ratio. Venofer to further increase iron store.   Patient declines [place] interpreter service  Orders Placed This Encounter  Procedures   CBC with Differential (Cancer Center Only)    Standing Status:   Future    Standing Expiration Date:   03/20/2024   Iron and TIBC    Standing Status:   Future    Standing Expiration Date:   03/20/2024   Ferritin    Standing Status:   Future    Standing Expiration Date:   03/20/2024    Follow-up in 6 months All questions were answered. The patient knows to call the clinic with any problems, questions or concerns.  [name], MD, PhD [facility] Hematology Oncology 03/21/2023     HISTORY OF PRESENTING ILLNESS:  Ruben Green is a  89 y.o.  male with PMH listed below who was referred to me for anemia Reviewed patient's recent labs that was done.  He was found to have abnormal CBC on 01/16/2022, hemoglobin 8.8, MCV 88.3, Reviewed patient's previous labs ordered by primary care physician's office, anemia is chronic onset, gradually worsening.  Patient has chronic kidney disease. He denies recent chest pain on exertion,  pre-syncopal episodes, or palpitations He had not noticed any recent bleeding such as epistaxis, hematuria  He denies over the counter NSAID ingestion. His  last colonoscopy was  He denies any pica and eats a variety of diet. Chronic shortness of breath with exertion due to COPD. Patient was accompanied by wife.   Patient has history of prostate cancer that was treated with radiation in 2017 by Dr. Rushie Chestnut.  Patient has had proctitis and intermittently he has blood in the stool.  INTERVAL HISTORY Ruben Green is a 89 y.o. male who has above history reviewed by me today presents for follow up visit for iron deficiency anemia Patient speaks [place].  He had a hard hearing.  Accompanied by son Patient declines [place] interpreter service No new complaints.  MEDICAL HISTORY:  Past Medical History:  Diagnosis Date   AAA (abdominal aortic aneurysm) (HCC)    a.) s/p repair in 2005   Anemia    Anginal pain (HCC)    Anxiety    Arthritis    B12 deficiency    Bilateral cataracts    a.) s/p BILATERAL extractions in 2018   BPH with obstruction/lower urinary tract symptoms    CAD (coronary artery disease)    Carotid artery stenosis    a.) s/p CEA on the RIGHT   CHF (congestive heart failure) (HCC)    COPD (chronic obstructive pulmonary disease) (HCC)    Diastolic dysfunction 07/29/2019   a.)  TTE 07/29/2019: EF 50-55%; LA mildly enlarged; G1DD.   DOE (dyspnea on exertion)    Elevated PSA    Environmental and seasonal allergies    History of 2019 novel coronavirus disease (COVID-19)  History of kidney stones    HLD (hyperlipidemia)    HOH (hard of hearing)    HTN (hypertension)    Incomplete bladder emptying    Macular degeneration    Prostate cancer (HCC)    RBBB (right bundle branch block)    Valvular insufficiency    a.) TTE 07/29/2019: LVEF 50-55%; LA mild enlarged; triv AR/PR, mild MR, mod TR.    SURGICAL HISTORY: Past Surgical History:  Procedure Laterality Date   ABDOMINAL AORTIC ANEURYSM REPAIR     CATARACT EXTRACTION W/PHACO Right 09/25/2016   Procedure: CATARACT EXTRACTION PHACO AND INTRAOCULAR LENS PLACEMENT (IOC);   Surgeon: Galen Manila, MD;  Location: [facility] ORS;  Service: Ophthalmology;  Laterality: Right;  [place] 01:01 AP% 17.4 CDE 10.75 Fluid pack lot # [id number]   CATARACT EXTRACTION W/PHACO Left 10/16/2016   Procedure: CATARACT EXTRACTION PHACO AND INTRAOCULAR LENS PLACEMENT (IOC) Suture placed in Left eye;  Surgeon: Galen Manila, MD;  Location: [facility] ORS;  Service: Ophthalmology;  Laterality: Left;  [place] 2:06.8 AP% 22.2 CDE 28.17 Fluid pack lot # [id number]   COLONOSCOPY WITH PROPOFOL N/A 03/31/2019   Procedure: COLONOSCOPY WITH PROPOFOL;  Surgeon: Christena Deem, MD;  Location[facility] ENDOSCOPY;  Service: Endoscopy;  Laterality: N/A;   COLONOSCOPY WITH PROPOFOL N/A 09/09/2020   Procedure: COLONOSCOPY WITH PROPOFOL;  Surgeon: Regis Bill, MD;  Location: [facility] ENDOSCOPY;  Service: Endoscopy;  Laterality: N/A;   CYSTOSCOPY W/ RETROGRADES Bilateral 02/23/2020   Procedure: CYSTOSCOPY WITH RETROGRADE PYELOGRAM;  Surgeon: Riki Altes, MD;  Location: [facility] ORS;  Service: Urology;  Laterality: Bilateral;   CYSTOSCOPY WITH BIOPSY N/A 02/23/2020   Procedure: CYSTOSCOPY WITH BIOPSY;  Surgeon: Riki Altes, MD;  Location: [facility] ORS;  Service: Urology;  Laterality: N/A;   EYE SURGERY     HERNIA REPAIR     x 2   INGUINAL HERNIA REPAIR Right 12/02/2015   Procedure: HERNIA REPAIR INGUINAL ADULT;  Surgeon: Nadeen Landau, MD;  Location: [facility] ORS;  Service: General;  Laterality: Right;   PROSTATE SURGERY  2012   Right Carotid Endarterectomy     TOTAL HIP ARTHROPLASTY Right 08/01/2021   Procedure: TOTAL HIP ARTHROPLASTY;  Surgeon: Christena Flake, MD;  Location: [facility] ORS;  Service: Orthopedics;  Laterality: Right;    SOCIAL HISTORY: Social History   Socioeconomic History   Marital status: Married    Spouse name: Not on file   Number of children: Not on file   Years of education: Not on file   Highest education level: Not on file  Occupational History   Not on file  Tobacco Use   Smoking  status: Former    Current packs/day: 0.00    Types: Cigarettes    Quit date: 11/30/1998    Years since quitting: 24.3   Smokeless tobacco: Former   Tobacco comments:    quit 10 years ago  Vaping Use   Vaping status: Never Used  Substance and Sexual Activity   Alcohol use: Not Currently    Comment: occasional   Drug use: No   Sexual activity: Not on file  Other Topics Concern   Not on file  Social History Narrative   Not on file   Social Determinants of Health   Financial Resource Strain: Low Risk  (03/07/2023)   Received from [facility]   Overall Financial Resource Strain (CARDIA)    Difficulty of Paying Living Expenses: Not hard at all  Food Insecurity: No Food Insecurity (03/07/2023)  Received from [facility]   Hunger Vital Sign    Worried About Running Out of Food in the Last Year: Never true    Ran Out of Food in the Last Year: Never true  Transportation Needs: No Transportation Needs (03/07/2023)   Received from [facility] - Transportation    In the past 12 months, has lack of transportation kept you from medical appointments or from getting medications?: No    Lack of Transportation (Non-Medical): No  Physical Activity: Unknown (04/23/2017)   Received from [facility], [facility]   Exercise Vital Sign    Days of Exercise per Week: Patient declined    Minutes of Exercise per Session: Patient declined  Stress: Unknown (04/23/2017)   Received from [facility], [facility]   [employer] of Occupational [facility] - Occupational Stress Questionnaire    Feeling of Stress : Patient declined  Social Connections: Unknown (04/23/2017)   Received from [facility], [facility]   Social Connection and Isolation Panel [NHANES]    Frequency of Communication with Friends and Family: Patient declined     Frequency of Social Gatherings with Friends and Family: Patient declined    Attends Religious Services: Patient declined    Active Member of Clubs or Organizations: Patient declined    Attends Banker Meetings: Patient declined    Marital Status: Patient declined  Intimate Partner Violence: Not At Risk (12/06/2022)   Humiliation, Afraid, Rape, and Kick questionnaire    Fear of Current or Ex-Partner: No    Emotionally Abused: No    Physically Abused: No    Sexually Abused: No    FAMILY HISTORY: Family History  Problem Relation Age of Onset   Cancer Brother        2000   Prostate cancer Neg Hx    Bladder Cancer Neg Hx    Kidney cancer Neg Hx     ALLERGIES:  is allergic to oxycodone.  MEDICATIONS:  Current Outpatient Medications  Medication Sig Dispense Refill   acetaminophen (TYLENOL) 500 MG tablet Take 1-2 tablets (500-1,000 mg total) by mouth every 6 (six) hours as needed for mild pain. 60 tablet 0   aspirin EC 81 MG tablet Take 1 tablet (81 mg total) by mouth daily. Swallow whole. 30 tablet    cyanocobalamin (VITAMIN B12) 1000 MCG tablet Take 1,000 mcg by mouth daily.     Fluticasone-Umeclidin-Vilant (TRELEGY ELLIPTA) 100-62.5-25 MCG/INH AEPB Inhale 2 puffs into the lungs daily.     iron polysaccharides (NIFEREX) 150 MG capsule Take 150 mg by mouth 2 (two) times daily.     LORazepam (ATIVAN) 0.5 MG tablet Take 0.5 mg by mouth daily as needed for anxiety.     metoprolol succinate (TOPROL-XL) 50 MG 24 hr tablet Take 1 tablet (50 mg total) by mouth daily. Take with or immediately following a meal. 30 tablet 0   tamsulosin (FLOMAX) 0.4 MG CAPS capsule Take 0.4 mg by mouth daily.     No current facility-administered medications for this visit.   Facility-Administered Medications Ordered in Other Visits  Medication Dose Route Frequency Provider Last Rate Last Admin   0.9 %  sodium chloride infusion   Intravenous PRN [name], MD   Stopped at 03/21/23 1539    Review  of Systems  Constitutional:  Positive for fatigue. Negative for chills and fever.  HENT:   Positive for hearing  loss. Negative for voice change.   Eyes:  Negative for eye problems and icterus.  Respiratory:  Positive for shortness of breath. Negative for chest tightness and cough.   Cardiovascular:  Negative for chest pain and leg swelling.  Gastrointestinal:  Negative for abdominal distention and abdominal pain.  Endocrine: Negative for hot flashes.  Genitourinary:  Negative for difficulty urinating, dysuria and frequency.   Musculoskeletal:  Negative for arthralgias.  Skin:  Negative for itching and rash.  Neurological:  Negative for light-headedness and numbness.  Hematological:  Negative for adenopathy. Does not bruise/bleed easily.  Psychiatric/Behavioral:  Negative for confusion.     PHYSICAL EXAMINATION: ECOG PERFORMANCE STATUS: 1 - Symptomatic but completely ambulatory Vitals:   03/21/23 1442  BP: 136/64  Pulse: 90  Resp: 18  Temp: (!) 97.4 F (36.3 C)  SpO2: 97%   Filed Weights   03/21/23 1442  Weight: 168 lb 6.4 oz (76.4 kg)    Physical Exam Constitutional:      General: He is not in acute distress. HENT:     Head: Normocephalic and atraumatic.  Eyes:     General: No scleral icterus. Cardiovascular:     Rate and Rhythm: Normal rate and regular rhythm.     Heart sounds: Normal heart sounds.  Pulmonary:     Effort: Pulmonary effort is normal. No respiratory distress.     Breath sounds: No wheezing.     Comments: Significantly decreased breath sound bilaterally Abdominal:     General: Bowel sounds are normal. There is no distension.     Palpations: Abdomen is soft.  Musculoskeletal:        General: No deformity. Normal range of motion.     Cervical back: Normal range of motion and neck supple.  Skin:    General: Skin is warm and dry.     Findings: No erythema or rash.  Neurological:     Mental Status: He is alert and oriented to person, place, and time.  Mental status is at baseline.     Cranial Nerves: No cranial nerve deficit.     Coordination: Coordination normal.  Psychiatric:        Mood and Affect: Mood normal.      LABORATORY DATA:  I have reviewed the data as listed    Latest Ref Rng & Units 03/15/2023    8:35 AM 12/06/2022   10:52 AM 12/05/2022   11:30 PM  CBC  WBC 4.0 - 10.5 K/uL 7.5  7.0  13.9   Hemoglobin 13.0 - 17.0 g/dL 45.4  09.8  11.9   Hematocrit 39.0 - 52.0 % 36.8  38.7  43.9   Platelets 150 - 400 K/uL 213  166  231       Latest Ref Rng & Units 12/07/2022    4:38 AM 12/06/2022   10:52 AM 12/05/2022   11:30 PM  CMP  Glucose 70 - 99 mg/dL 147  829  562   BUN 8 - 23 mg/dL 43  54  59   Creatinine 0.61 - 1.24 mg/dL 1.30  8.65  7.84   Sodium 135 - 145 mmol/L 139  137  134   Potassium 3.5 - 5.1 mmol/L 4.4  4.6  5.1   Chloride 98 - 111 mmol/L 107  102  94   CO2 22 - 32 mmol/L 27  23  23    Calcium 8.9 - 10.3 mg/dL 8.1  8.3  9.6   Total Protein 6.5 - 8.1 g/dL   7.8  Total Bilirubin 0.3 - 1.2 mg/dL   1.2   Alkaline Phos 38 - 126 U/L   102   AST 15 - 41 U/L   23   ALT 0 - 44 U/L   16       Component Value Date/Time   IRON 53 03/15/2023 0835   TIBC 300 03/15/2023 0835   FERRITIN 118 03/15/2023 0835   IRONPCTSAT 18 03/15/2023 0835     RADIOGRAPHIC STUDIES: I have personally reviewed the radiological images as listed and agreed with the findings in the report. No results found.

## 2023-03-21 NOTE — Assessment & Plan Note (Addendum)
He tolerates IV venofer treatments.  Lab Results  Component Value Date   HGB 11.4 (L) 03/15/2023   TIBC 300 03/15/2023   IRONPCTSAT 18 03/15/2023   FERRITIN 118 03/15/2023    Hemoglobin is slightly decreased.  Recommend IV Venofer 200mg  x 2

## 2023-03-24 ENCOUNTER — Emergency Department
Admission: EM | Admit: 2023-03-24 | Discharge: 2023-03-24 | Disposition: A | Discharge status: Home / Self Care | Payer: PPO | Attending: Emergency Medicine | Admitting: Emergency Medicine

## 2023-03-24 ENCOUNTER — Emergency Department: Payer: PPO

## 2023-03-24 ENCOUNTER — Other Ambulatory Visit: Payer: Self-pay

## 2023-03-24 DIAGNOSIS — J441 Chronic obstructive pulmonary disease with (acute) exacerbation: Secondary | ICD-10-CM | POA: Insufficient documentation

## 2023-03-24 DIAGNOSIS — Z1152 Encounter for screening for COVID-19: Secondary | ICD-10-CM | POA: Diagnosis not present

## 2023-03-24 DIAGNOSIS — K449 Diaphragmatic hernia without obstruction or gangrene: Secondary | ICD-10-CM | POA: Diagnosis not present

## 2023-03-24 DIAGNOSIS — J90 Pleural effusion, not elsewhere classified: Secondary | ICD-10-CM | POA: Diagnosis not present

## 2023-03-24 DIAGNOSIS — I2510 Atherosclerotic heart disease of native coronary artery without angina pectoris: Secondary | ICD-10-CM | POA: Insufficient documentation

## 2023-03-24 DIAGNOSIS — R600 Localized edema: Secondary | ICD-10-CM | POA: Diagnosis not present

## 2023-03-24 DIAGNOSIS — R0602 Shortness of breath: Secondary | ICD-10-CM | POA: Diagnosis not present

## 2023-03-24 DIAGNOSIS — J9621 Acute and chronic respiratory failure with hypoxia: Secondary | ICD-10-CM | POA: Diagnosis not present

## 2023-03-24 DIAGNOSIS — I10 Essential (primary) hypertension: Secondary | ICD-10-CM | POA: Diagnosis not present

## 2023-03-24 DIAGNOSIS — D509 Iron deficiency anemia, unspecified: Secondary | ICD-10-CM | POA: Diagnosis not present

## 2023-03-24 DIAGNOSIS — R918 Other nonspecific abnormal finding of lung field: Secondary | ICD-10-CM | POA: Diagnosis not present

## 2023-03-24 LAB — BASIC METABOLIC PANEL
Anion gap: 8 (ref 5–15)
BUN: 51 mg/dL — ABNORMAL HIGH (ref 8–23)
CO2: 23 mmol/L (ref 22–32)
Calcium: 8.7 mg/dL — ABNORMAL LOW (ref 8.9–10.3)
Chloride: 107 mmol/L (ref 98–111)
Creatinine, Ser: 1.27 mg/dL — ABNORMAL HIGH (ref 0.61–1.24)
GFR, Estimated: 55 mL/min — ABNORMAL LOW (ref >60)
Glucose, Bld: 89 mg/dL (ref 70–99)
Potassium: 4.8 mmol/L (ref 3.5–5.1)
Sodium: 138 mmol/L (ref 135–145)

## 2023-03-24 LAB — CBC
HCT: 34.6 % — ABNORMAL LOW (ref 39.0–52.0)
Hemoglobin: 10.8 g/dL — ABNORMAL LOW (ref 13.0–17.0)
MCH: 30.3 pg (ref 26.0–34.0)
MCHC: 31.2 g/dL (ref 30.0–36.0)
MCV: 96.9 fL (ref 80.0–100.0)
Platelets: 214 10*3/uL (ref 150–400)
RBC: 3.57 MIL/uL — ABNORMAL LOW (ref 4.22–5.81)
RDW: 13.5 % (ref 11.5–15.5)
WBC: 6.5 10*3/uL (ref 4.0–10.5)
nRBC: 0 % (ref 0.0–0.2)

## 2023-03-24 LAB — TROPONIN I (HIGH SENSITIVITY)
Troponin I (High Sensitivity): 8 ng/L (ref <18)
Troponin I (High Sensitivity): 8 ng/L (ref <18)

## 2023-03-24 LAB — BRAIN NATRIURETIC PEPTIDE: B Natriuretic Peptide: 96.5 pg/mL (ref 0.0–100.0)

## 2023-03-24 LAB — RESP PANEL BY RT-PCR (RSV, FLU A&B, COVID)  RVPGX2
Influenza A by PCR: NEGATIVE
Influenza B by PCR: NEGATIVE
Resp Syncytial Virus by PCR: NEGATIVE
SARS Coronavirus 2 by RT PCR: NEGATIVE

## 2023-03-24 MED ORDER — PREDNISONE 20 MG PO TABS: 60.0000 mg | ORAL_TABLET | Freq: Every day | ORAL | 0 refills | Status: AC

## 2023-03-24 MED ORDER — ALBUTEROL SULFATE HFA 108 (90 BASE) MCG/ACT IN AERS: 2.0000 | INHALATION_SPRAY | RESPIRATORY_TRACT | 0 refills | Status: AC | PRN

## 2023-03-24 MED ORDER — SODIUM CHLORIDE 0.9 % IV BOLUS
500.0000 mL | Freq: Once | INTRAVENOUS | Status: AC
  Administered 2023-03-24: 500 mL via INTRAVENOUS

## 2023-03-24 MED ORDER — METHYLPREDNISOLONE SODIUM SUCC 125 MG IJ SOLR
125.0000 mg | Freq: Once | INTRAMUSCULAR | Status: AC
  Administered 2023-03-24: 125 mg via INTRAVENOUS
  Filled 2023-03-24: qty 2

## 2023-03-24 MED ORDER — IPRATROPIUM-ALBUTEROL 0.5-2.5 (3) MG/3ML IN SOLN
3.0000 mL | Freq: Once | RESPIRATORY_TRACT | Status: AC
  Administered 2023-03-24: 3 mL via RESPIRATORY_TRACT
  Filled 2023-03-24: qty 3

## 2023-03-24 NOTE — ED Provider Notes (Signed)
[facility] Provider Note    Event Date/Time   First MD Initiated Contact with Patient 03/24/23 1504     (approximate)   History   Shortness of Breath   HPI  Ruben Green is a 89 y.o. male with a history of CAD, COPD, hypertension, AAA s/p repair, BPH, and carcinoma in situ of the bladder who presents with shortness of breath for approximately 2 weeks, persistent course, not relieved by albuterol at home.  The wife states that the patient has also been intermittently on prednisone and antibiotics.  He has a cough productive of white sputum.  He reports some upper back pain but no chest pain.  He has right sided leg swelling.  The patient denies any fever, vomiting, diarrhea, or abdominal pain.  I had the past medical records.  The patient was admitted to the hospital service in June for SBO which was treated nonoperatively.  His recent outpatient counter was on 10/3 with hematology for follow-up of anemia of chronic kidney disease.  He was treated with Venofer.   Physical Exam   Triage Vital Signs: ED Triage Vitals  Encounter Vitals Group     BP 03/24/23 1437 (!) 157/81     Systolic BP Percentile --      Diastolic BP Percentile --      Pulse Rate 03/24/23 1437 69     Resp 03/24/23 1437 20     Temp 03/24/23 1437 97.9 F (36.6 C)     Temp Source 03/24/23 1437 Oral     SpO2 03/24/23 1437 94 %     Weight 03/24/23 1433 168 lb (76.2 kg)     Height 03/24/23 1433 5\' 5"  (1.651 m)     Head Circumference --      Peak Flow --      Pain Score --      Pain Loc --      Pain Education --      Exclude from Growth Chart --     Most recent vital signs: Vitals:   03/24/23 1905 03/24/23 1906  BP:  (!) 157/68  Pulse:  94  Resp:  (!) 26  Temp:    SpO2: 94% 93%    General: Awake, no distress.  CV:  Good peripheral perfusion.  Resp:  Increased effort.  Diminished breath sounds bilaterally with no significant wheezing or rales. Abd:  Soft and nontender.   No distention.  Other:  1 right lower extremity edema.   ED Results / Procedures / Treatments   Labs (all labs ordered are listed, but only abnormal results are displayed) Labs Reviewed  BASIC METABOLIC PANEL - Abnormal; Notable for the following components:      Result Value   BUN 51 (*)    Creatinine, Ser 1.27 (*)    Calcium 8.7 (*)    GFR, Estimated 55 (*)    All other components within normal limits  CBC - Abnormal; Notable for the following components:   RBC 3.57 (*)    Hemoglobin 10.8 (*)    HCT 34.6 (*)    All other components within normal limits  RESP PANEL BY RT-PCR (RSV, FLU A&B, COVID)  [id number]  BRAIN NATRIURETIC PEPTIDE  TROPONIN I (HIGH SENSITIVITY)  TROPONIN I (HIGH SENSITIVITY)     EKG  ED ECG REPORT I, Dionne Bucy, the attending physician, personally viewed and interpreted this ECG.  Date: 03/24/2023 EKG Time: 1434 Rate: 72 Rhythm: normal sinus rhythm QRS Axis: Left axis  Intervals: Are BB ST/T Wave abnormalities: normal Narrative Interpretation: no evidence of acute ischemia    RADIOLOGY  Chest x-ray: I independently viewed and interpreted the images; there is no focal consolidation or edema  US venous RLE: No acute DVT  PROCEDURES:  Critical Care performed: No  Procedures   MEDICATIONS ORDERED IN ED: Medications  sodium chloride 0.9 % bolus 500 mL (0 mLs Intravenous Stopped 03/24/23 1701)  ipratropium-albuterol (DUONEB) 0.5-2.5 (3) MG/3ML nebulizer solution 3 mL (3 mLs Nebulization Given 03/24/23 1552)  ipratropium-albuterol (DUONEB) 0.5-2.5 (3) MG/3ML nebulizer solution 3 mL (3 mLs Nebulization Given 03/24/23 1552)  methylPREDNISolone sodium succinate (SOLU-MEDROL) 125 mg/2 mL injection 125 mg (125 mg Intravenous Given 03/24/23 1552)     IMPRESSION / MDM / ASSESSMENT AND PLAN / ED COURSE  I reviewed the triage vital signs and the nursing notes.  89 year old male with PMH as noted above presents with worsening shortness of  breath for 2 weeks associated with productive cough but no fever.  The patient only wears oxygen at night normally.  O2 saturation was in the mid 80s on room air when he arrived.  He is now on 2 L O2.  Differential diagnosis includes, but is not limited to, COPD exacerbation, pneumonia, acute bronchitis, COVID-19, other viral syndrome, less likely new onset CHF or other cardiac etiology.  Unilateral leg swelling is also somewhat concerning for DVT.  We will obtain lab workup, chest x-ray, ultrasound, give bronchodilators, steroid, and reassess.  Patient's presentation is most consistent with acute presentation with potential threat to life or bodily function.  The patient is on the cardiac monitor to evaluate for evidence of arrhythmia and/or significant heart rate changes.  ----------------------------------------- 7:25 PM on 03/24/2023 -----------------------------------------  Workup is reassuring.  Chest x-ray shows no acute findings.  BNP is normal.  Troponins are -2.  Respiratory panel is negative.  BMP and CBC showed no acute abnormalities.  On reassessment, the patient appears comfortable and does not have any respiratory distress.  He is still requiring O2 by nasal cannula at 3 L to maintain an O2 saturation in the 90s.  I discussed the results of the workup and plan of care with the patient and his wife.  I strongly considered admitting the patient given that he is now requiring oxygen and I discussed this with the patient.  However, the patient is adamant that he would prefer to go home.  Since he does have home oxygen that he just uses as needed and at night, I think that this is reasonable.  I have prescribed prednisone and albuterol.  The patient is also on Trelegy.  I instructed him to follow-up with his primary care doctor soon as possible.  I gave strict return precautions and the patient and his wife expressed understanding.   FINAL CLINICAL IMPRESSION(S) / ED DIAGNOSES   Final  diagnoses:  COPD exacerbation (HCC)  Acute on chronic respiratory failure with hypoxia (HCC)     Rx / DC Orders   ED Discharge Orders          Ordered    predniSONE (DELTASONE) 20 MG tablet  Daily with breakfast        03/24/23 1922    albuterol (VENTOLIN HFA) 108 (90 Base) MCG/ACT inhaler  Every 4 hours PRN        03/24/23 1923             Note:  This document was prepared using Dragon voice recognition software and may include unintentional  dictation errors.    Dionne Bucy, MD 03/24/23 [phone]

## 2023-03-24 NOTE — ED Triage Notes (Signed)
Pt via POV from [facility]. Per KC, pt O2 sat 87% on RA. Placed on 2L [place] and increased to 93%. Pt has a hx of COPD and only wear O2 PRN and at nights. Denies pain. Denies blood thinner. Family states SOB has been going on for weeks and just gradually gotten worse. Pt is A&OX4 and NAD

## 2023-03-24 NOTE — Discharge Instructions (Addendum)
Take the prednisone as prescribed starting tomorrow.  Use your Trelegy as prescribed.  You should also use the albuterol inhaler that we prescribed every 4 hours.  Keep the oxygen on 2 or 3 L at all times.  Return to the ER immediately for new, worsening, or persistent severe shortness of breath, cough, weakness, or any other new or worsening symptoms that concern you.

## 2023-03-24 NOTE — ED Notes (Signed)
This RN request family to provide patient's oxygen. Patient states that he can make it home without oxygen. Patient refuses to have family member drive home to grab his oxygen tank.

## 2023-03-24 NOTE — ED Notes (Signed)
Pt taken to XR.  

## 2023-03-26 DIAGNOSIS — H353221 Exudative age-related macular degeneration, left eye, with active choroidal neovascularization: Secondary | ICD-10-CM | POA: Diagnosis not present

## 2023-04-03 DIAGNOSIS — R0902 Hypoxemia: Secondary | ICD-10-CM | POA: Diagnosis not present

## 2023-04-03 DIAGNOSIS — J449 Chronic obstructive pulmonary disease, unspecified: Secondary | ICD-10-CM | POA: Diagnosis not present

## 2023-04-03 DIAGNOSIS — I517 Cardiomegaly: Secondary | ICD-10-CM | POA: Diagnosis not present

## 2023-04-03 DIAGNOSIS — I10 Essential (primary) hypertension: Secondary | ICD-10-CM | POA: Diagnosis not present

## 2023-04-03 DIAGNOSIS — J441 Chronic obstructive pulmonary disease with (acute) exacerbation: Secondary | ICD-10-CM | POA: Diagnosis not present

## 2023-04-03 DIAGNOSIS — R918 Other nonspecific abnormal finding of lung field: Secondary | ICD-10-CM | POA: Diagnosis not present

## 2023-04-03 DIAGNOSIS — I2510 Atherosclerotic heart disease of native coronary artery without angina pectoris: Secondary | ICD-10-CM | POA: Diagnosis not present

## 2023-04-04 ENCOUNTER — Inpatient Hospital Stay: Payer: PPO

## 2023-04-11 ENCOUNTER — Inpatient Hospital Stay: Payer: PPO

## 2023-04-11 VITALS — BP 121/59 | HR 74 | Temp 98.3°F | Resp 20

## 2023-04-11 DIAGNOSIS — D500 Iron deficiency anemia secondary to blood loss (chronic): Secondary | ICD-10-CM

## 2023-04-11 DIAGNOSIS — D509 Iron deficiency anemia, unspecified: Secondary | ICD-10-CM | POA: Diagnosis not present

## 2023-04-11 MED ORDER — IRON SUCROSE 20 MG/ML IV SOLN
200.0000 mg | Freq: Once | INTRAVENOUS | Status: AC
  Administered 2023-04-11: 200 mg via INTRAVENOUS
  Filled 2023-04-11: qty 10

## 2023-04-11 NOTE — Patient Instructions (Signed)
Iron Sucrose Injection What is this medication? IRON SUCROSE (EYE ern SOO krose) treats low levels of iron (iron deficiency anemia) in people with kidney disease. Iron is a mineral that plays an important role in making red blood cells, which carry oxygen from your lungs to the rest of your body. This medicine may be used for other purposes; ask your health care provider or pharmacist if you have questions. COMMON BRAND NAME(S): Venofer What should I tell my care team before I take this medication? They need to know if you have any of these conditions: Anemia not caused by low iron levels Heart disease High levels of iron in the blood Kidney disease Liver disease An unusual or allergic reaction to iron, other medications, foods, dyes, or preservatives Pregnant or trying to get pregnant Breastfeeding How should I use this medication? This medication is for infusion into a vein. It is given in a hospital or clinic setting. Talk to your care team about the use of this medication in children. While this medication may be prescribed for children as young as 2 years for selected conditions, precautions do apply. Overdosage: If you think you have taken too much of this medicine contact a poison control center or emergency room at once. NOTE: This medicine is only for you. Do not share this medicine with others. What if I miss a dose? Keep appointments for follow-up doses. It is important not to miss your dose. Call your care team if you are unable to keep an appointment. What may interact with this medication? Do not take this medication with any of the following: Deferoxamine Dimercaprol Other iron products This medication may also interact with the following: Chloramphenicol Deferasirox This list may not describe all possible interactions. Give your health care provider a list of all the medicines, herbs, non-prescription drugs, or dietary supplements you use. Also tell them if you smoke,  drink alcohol, or use illegal drugs. Some items may interact with your medicine. What should I watch for while using this medication? Visit your care team regularly. Tell your care team if your symptoms do not start to get better or if they get worse. You may need blood work done while you are taking this medication. You may need to follow a special diet. Talk to your care team. Foods that contain iron include: whole grains/cereals, dried fruits, beans, or peas, leafy green vegetables, and organ meats (liver, kidney). What side effects may I notice from receiving this medication? Side effects that you should report to your care team as soon as possible: Allergic reactions--skin rash, itching, hives, swelling of the face, lips, tongue, or throat Low blood pressure--dizziness, feeling faint or lightheaded, blurry vision Shortness of breath Side effects that usually do not require medical attention (report to your care team if they continue or are bothersome): Flushing Headache Joint pain Muscle pain Nausea Pain, redness, or irritation at injection site This list may not describe all possible side effects. Call your doctor for medical advice about side effects. You may report side effects to FDA at 1-800-FDA-1088. Where should I keep my medication? This medication is given in a hospital or clinic. It will not be stored at home. NOTE: This sheet is a summary. It may not cover all possible information. If you have questions about this medicine, talk to your doctor, pharmacist, or health care provider.  2024 Elsevier/Gold Standard (2022-11-09 00:00:00)

## 2023-04-23 DIAGNOSIS — Z96641 Presence of right artificial hip joint: Secondary | ICD-10-CM | POA: Diagnosis not present

## 2023-04-23 DIAGNOSIS — J440 Chronic obstructive pulmonary disease with acute lower respiratory infection: Secondary | ICD-10-CM | POA: Diagnosis not present

## 2023-04-23 DIAGNOSIS — J9611 Chronic respiratory failure with hypoxia: Secondary | ICD-10-CM | POA: Diagnosis not present

## 2023-04-23 DIAGNOSIS — D090 Carcinoma in situ of bladder: Secondary | ICD-10-CM | POA: Diagnosis not present

## 2023-04-23 DIAGNOSIS — R7989 Other specified abnormal findings of blood chemistry: Secondary | ICD-10-CM | POA: Diagnosis not present

## 2023-04-23 DIAGNOSIS — M5489 Other dorsalgia: Secondary | ICD-10-CM | POA: Diagnosis not present

## 2023-04-23 DIAGNOSIS — D649 Anemia, unspecified: Secondary | ICD-10-CM | POA: Diagnosis not present

## 2023-04-23 DIAGNOSIS — M7989 Other specified soft tissue disorders: Secondary | ICD-10-CM | POA: Diagnosis not present

## 2023-04-23 DIAGNOSIS — M546 Pain in thoracic spine: Secondary | ICD-10-CM | POA: Diagnosis not present

## 2023-04-23 DIAGNOSIS — I10 Essential (primary) hypertension: Secondary | ICD-10-CM | POA: Diagnosis not present

## 2023-04-23 DIAGNOSIS — J209 Acute bronchitis, unspecified: Secondary | ICD-10-CM | POA: Diagnosis not present

## 2023-04-23 DIAGNOSIS — C61 Malignant neoplasm of prostate: Secondary | ICD-10-CM | POA: Diagnosis not present

## 2023-05-06 ENCOUNTER — Encounter: Payer: Self-pay | Admitting: Family

## 2023-05-06 ENCOUNTER — Ambulatory Visit: Payer: PPO | Attending: Family | Admitting: Family

## 2023-05-06 VITALS — BP 148/75 | HR 49 | Wt 171.0 lb

## 2023-05-06 DIAGNOSIS — F419 Anxiety disorder, unspecified: Secondary | ICD-10-CM | POA: Insufficient documentation

## 2023-05-06 DIAGNOSIS — N189 Chronic kidney disease, unspecified: Secondary | ICD-10-CM

## 2023-05-06 DIAGNOSIS — E785 Hyperlipidemia, unspecified: Secondary | ICD-10-CM | POA: Insufficient documentation

## 2023-05-06 DIAGNOSIS — I4510 Unspecified right bundle-branch block: Secondary | ICD-10-CM | POA: Insufficient documentation

## 2023-05-06 DIAGNOSIS — Z9981 Dependence on supplemental oxygen: Secondary | ICD-10-CM | POA: Diagnosis not present

## 2023-05-06 DIAGNOSIS — I2510 Atherosclerotic heart disease of native coronary artery without angina pectoris: Secondary | ICD-10-CM | POA: Diagnosis not present

## 2023-05-06 DIAGNOSIS — Z8679 Personal history of other diseases of the circulatory system: Secondary | ICD-10-CM | POA: Diagnosis not present

## 2023-05-06 DIAGNOSIS — I509 Heart failure, unspecified: Secondary | ICD-10-CM | POA: Insufficient documentation

## 2023-05-06 DIAGNOSIS — I428 Other cardiomyopathies: Secondary | ICD-10-CM | POA: Insufficient documentation

## 2023-05-06 DIAGNOSIS — Z79899 Other long term (current) drug therapy: Secondary | ICD-10-CM | POA: Diagnosis not present

## 2023-05-06 DIAGNOSIS — Z87891 Personal history of nicotine dependence: Secondary | ICD-10-CM | POA: Insufficient documentation

## 2023-05-06 DIAGNOSIS — I110 Hypertensive heart disease with heart failure: Secondary | ICD-10-CM | POA: Insufficient documentation

## 2023-05-06 DIAGNOSIS — Z8546 Personal history of malignant neoplasm of prostate: Secondary | ICD-10-CM | POA: Insufficient documentation

## 2023-05-06 DIAGNOSIS — I4891 Unspecified atrial fibrillation: Secondary | ICD-10-CM | POA: Diagnosis not present

## 2023-05-06 DIAGNOSIS — I480 Paroxysmal atrial fibrillation: Secondary | ICD-10-CM | POA: Diagnosis not present

## 2023-05-06 DIAGNOSIS — I10 Essential (primary) hypertension: Secondary | ICD-10-CM

## 2023-05-06 DIAGNOSIS — D649 Anemia, unspecified: Secondary | ICD-10-CM | POA: Insufficient documentation

## 2023-05-06 DIAGNOSIS — I5032 Chronic diastolic (congestive) heart failure: Secondary | ICD-10-CM

## 2023-05-06 DIAGNOSIS — J449 Chronic obstructive pulmonary disease, unspecified: Secondary | ICD-10-CM | POA: Insufficient documentation

## 2023-05-06 DIAGNOSIS — D631 Anemia in chronic kidney disease: Secondary | ICD-10-CM

## 2023-05-06 MED ORDER — TORSEMIDE 20 MG PO TABS: 20.0000 mg | ORAL_TABLET | Freq: Every day | ORAL | 3 refills | Status: DC

## 2023-05-06 NOTE — Patient Instructions (Signed)
Please wear compression socks  START torsemide 20mg  daily  Do the following things EVERYDAY: Weigh yourself in the morning before breakfast. Write it down and keep it in a log. Take your medicines as prescribed Eat low salt foods--Limit salt (sodium) to 2000 mg per day.  Stay as active as you can everyday Limit all fluids for the day to less than 2 liters  Follow up in 1 month

## 2023-05-06 NOTE — Progress Notes (Signed)
PCP: Barbette Reichmann, MD (last seen 11/24) Primary Cardiologist: Dorothyann Peng, MD (last seen 06/24)  Patient declines interpreter and uses his wife if he doesn't understand something  HPI:  Ruben Green is a 89 y/o male with a history of AAA, small bowel obstruction, carotid disease, CAD, hyperlipidemia, HTN, atrial fibrillation, anemia, anxiety, BPD, COPD, prostate cancer, valvular insufficiency, previous tobacco use and chronic heart failure.   Admitted 12/23/21 due to acute respiratory distress. Placed on bipap. Initially given IV lasix with transition to oral diuretics. Cardiology and pulmonology consults obtained. Given IV antibiotics and nebulizers. Transitioned off bipap to oxygen at 3L but could not be weaned off of it. PT/OT evaluations done. Discharged after 5 days. Admitted 12/05/22 due to 3-day history of lower abdominal pain 9 out of 10 at its worst.  Associated with nausea, intermittent vomiting, abdominal bloating, constipation and absence of flatus. Workup in the ED revealed SBO seen on CT scan. NG tube placement was attempted however the patient did not tolerate. Had bowel movements with improvement of symptoms. Surgical consult done. Was in the ED 03/24/23 due to SOB for ~ [redacted] weeks along with a cough. CXR, BNP are normal. Troponins are negative. BMP/ CBC showed nothing acute. Unable to wean off oxygen.   Echo 08/10/21: EF 60-65% with Grade I DD, mild/ moderate RAE Echo 12/24/21: EF 50-55% along with mild LVH and trivial Ruben.   He presents today for an acute HF visit with a chief complaint of minimal shortness of breath with moderate exertion. Has associated pedal edema with R>L (for ~ last 4 weeks) and palpitations along with this. Denies chest pain, cough, fatigue, abdominal distention, dizziness, weight gain or difficulty sleeping. Wearing oxygen @ 3L at bedtime.   Drinking 2 bottles of water, 1-2 cups coffee daily. Not adding salt to his food.   Wife says that patient does not  take any fluid pill including hydrochlorothiazide.   ROS: All systems negative except as listed in HPI, PMH and Problem List.  SH:  Social History   Socioeconomic History   Marital status: Married    Spouse name: Not on file   Number of children: Not on file   Years of education: Not on file   Highest education level: Not on file  Occupational History   Not on file  Tobacco Use   Smoking status: Former    Current packs/day: 0.00    Types: Cigarettes    Quit date: 11/30/1998    Years since quitting: 24.4   Smokeless tobacco: Former   Tobacco comments:    quit 10 years ago  Vaping Use   Vaping status: Never Used  Substance and Sexual Activity   Alcohol use: Not Currently    Comment: occasional   Drug use: No   Sexual activity: Not on file  Other Topics Concern   Not on file  Social History Narrative   Not on file   Social Determinants of Health   Financial Resource Strain: Low Risk  (03/07/2023)   Received from [facility]   Overall Financial Resource Strain (CARDIA)    Difficulty of Paying Living Expenses: Not hard at all  Food Insecurity: No Food Insecurity (03/07/2023)   Received from [facility]   Hunger Vital Sign    Worried About Running Out of Food in the Last Year: Never true    Ran Out of Food in the Last Year: Never true  Transportation Needs: No Transportation Needs (03/07/2023)   Received  from [facility] - Transportation    In the past 12 months, has lack of transportation kept you from medical appointments or from getting medications?: No    Lack of Transportation (Non-Medical): No  Physical Activity: Unknown (04/23/2017)   Received from [facility], [facility]   Exercise Vital Sign    Days of Exercise per Week: Patient declined    Minutes of Exercise per Session: Patient declined  Stress: Unknown (04/23/2017)   Received from [facility], [facility]   [employer] of Occupational Health - Occupational Stress Questionnaire    Feeling of Stress : Patient declined  Social Connections: Unknown (04/23/2017)   Received from [facility], [facility]   Social Connection and Isolation Panel [NHANES]    Frequency of Communication with Friends and Family: Patient declined    Frequency of Social Gatherings with Friends and Family: Patient declined    Attends Religious Services: Patient declined    Active Member of Clubs or Organizations: Patient declined    Attends Banker Meetings: Patient declined    Marital Status: Patient declined  Intimate Partner Violence: Not At Risk (12/06/2022)   Humiliation, Afraid, Rape, and Kick questionnaire    Fear of Current or Ex-Partner: No    Emotionally Abused: No    Physically Abused: No    Sexually Abused: No    FH:  Family History  Problem Relation Age of Onset   Cancer Brother        2000   Prostate cancer Neg Hx    Bladder Cancer Neg Hx    Kidney cancer Neg Hx     Past Medical History:  Diagnosis Date   AAA (abdominal aortic aneurysm) (HCC)    a.) s/p repair in 2005   Anemia    Anginal pain (HCC)    Anxiety    Arthritis    B12 deficiency    Bilateral cataracts    a.) s/p BILATERAL extractions in 2018   BPH with obstruction/lower urinary tract symptoms    CAD (coronary artery disease)    Carotid artery stenosis    a.) s/p CEA on the RIGHT   CHF (congestive heart failure) (HCC)    COPD (chronic obstructive pulmonary disease) (HCC)    Diastolic dysfunction 07/29/2019   a.)  TTE 07/29/2019: EF 50-55%; LA mildly enlarged; G1DD.   DOE (dyspnea on exertion)    Elevated PSA    Environmental and seasonal allergies    History of 2019 novel coronavirus disease (COVID-19)    History of kidney stones    HLD (hyperlipidemia)    HOH (hard of hearing)    HTN (hypertension)    Incomplete bladder emptying     Macular degeneration    Prostate cancer (HCC)    RBBB (right bundle branch block)    Valvular insufficiency    a.) TTE 07/29/2019: LVEF 50-55%; LA mild enlarged; triv AR/PR, mild Ruben, mod TR.    Current Outpatient Medications  Medication Sig Dispense Refill   acetaminophen (TYLENOL) 500 MG tablet Take 1-2 tablets (500-1,000 mg total) by mouth every 6 (six) hours as needed for mild pain. 60 tablet 0   albuterol (VENTOLIN HFA) 108 (90 Base) MCG/ACT inhaler Inhale 2 puffs into the lungs every 4 (four) hours as needed. 8 g 0   aspirin EC 81 MG tablet Take 1 tablet (81 mg total) by mouth daily. Swallow whole. 30  tablet    cyanocobalamin (VITAMIN B12) 1000 MCG tablet Take 1,000 mcg by mouth daily.     Fluticasone-Umeclidin-Vilant (TRELEGY ELLIPTA) 100-62.5-25 MCG/INH AEPB Inhale 2 puffs into the lungs daily.     iron polysaccharides (NIFEREX) 150 MG capsule Take 150 mg by mouth 2 (two) times daily.     LORazepam (ATIVAN) 0.5 MG tablet Take 0.5 mg by mouth daily as needed for anxiety.     metoprolol succinate (TOPROL-XL) 50 MG 24 hr tablet Take 1 tablet (50 mg total) by mouth daily. Take with or immediately following a meal. 30 tablet 0   tamsulosin (FLOMAX) 0.4 MG CAPS capsule Take 0.4 mg by mouth daily.     No current facility-administered medications for this visit.   Vitals:   05/06/23 1435  BP: (!) 148/75  Pulse: (!) 49  SpO2: 97%  Weight: 171 lb (77.6 kg)   Wt Readings from Last 3 Encounters:  05/06/23 171 lb (77.6 kg)  03/24/23 168 lb (76.2 kg)  03/21/23 168 lb 6.4 oz (76.4 kg)   Lab Results  Component Value Date   CREATININE 1.27 (H) 03/24/2023   CREATININE 1.33 (H) 12/07/2022   CREATININE 1.51 (H) 12/06/2022   PHYSICAL EXAM:  General:  Well appearing. No resp difficulty HEENT: normal Neck: supple. JVP flat. No lymphadenopathy or thryomegaly appreciated. Cor: PMI normal. Regular rhythm, bradycardic. No rubs, gallops or murmurs. Lungs: clear Abdomen: soft, nontender,  nondistended. No hepatosplenomegaly. No bruits or masses.  Extremities: no cyanosis, clubbing, rash, 2+ pitting edema right lower leg, 1+ pitting left lower leg Neuro: alert & oriented x3, cranial nerves grossly intact. Moves all 4 extremities w/o difficulty. Affect pleasant.   ECG: NSR with RBBB (personally reviewed)  ReDs: 30%   ASSESSMENT & PLAN:  1: NICM with preserved ejection fraction- - suspect due to HTN/ COPD - NYHA class II - euvolemic today - weighing daily; reminded to call for an overnight weight gain of > 2 pounds or a weekly weight gain of > 5 pounds - ReDs: 30% - Echo 08/10/21: EF 60-65% with Grade I DD, mild/ moderate RAE - Echo 12/24/21: EF 50-55% along with mild LVH and trivial Ruben.  - will need to get echo updated - continue metoprolol succinate 50mg  daily - begin torsemide 20mg  daily - should swelling resolve, can stop the torsemide and use it as PRN - BMET next visit - get compression socks and put them on every morning with removal at bedtime - elevate legs when sitting for long periods of time - not adding salt to his food - BNP 12/23/21 was 415.5  2: HTN- - BP 148/75 - saw PCP (Hande) 11/24 - BMP 03/24/23 reviewed and showed sodium 138, potassium 4.8, creatinine 1.27 & GFR 55  3: COPD- - saw pulmonology Karna Christmas) 08/23 - wearing oxygent at bedtime  4: Anemia- - saw hematology Cathie Hoops) 10/24 - last iron infusion was 10/24 - Hg 03/24/23 was 10.8  5: Atrial fibrillation- - saw cardiology Juliann Pares) 06/24 - EKG today is NSR  Return in 1 month, sooner if needed.

## 2023-05-14 DIAGNOSIS — H353221 Exudative age-related macular degeneration, left eye, with active choroidal neovascularization: Secondary | ICD-10-CM | POA: Diagnosis not present

## 2023-05-23 DIAGNOSIS — I5032 Chronic diastolic (congestive) heart failure: Secondary | ICD-10-CM | POA: Diagnosis not present

## 2023-05-23 DIAGNOSIS — J449 Chronic obstructive pulmonary disease, unspecified: Secondary | ICD-10-CM | POA: Diagnosis not present

## 2023-06-05 NOTE — Progress Notes (Unsigned)
PCP: Barbette Reichmann, MD (last seen 12/24) Primary Cardiologist: Dorothyann Peng, MD (last seen 06/24)  Patient declines interpreter and uses his wife if he doesn't understand something  Chief Complaint: fatigue  HPI:  Ruben Green is a 89 y/o male with a history of AAA, small bowel obstruction, carotid disease, CAD, hyperlipidemia, HTN, atrial fibrillation, anemia, anxiety, BPD, COPD, prostate cancer, valvular insufficiency, previous tobacco use and chronic heart failure.   Admitted 12/23/21 due to acute respiratory distress. Placed on bipap. Initially given IV lasix with transition to oral diuretics. Cardiology and pulmonology consults obtained. Given IV antibiotics and nebulizers. Transitioned off bipap to oxygen at 3L but could not be weaned off of it. PT/OT evaluations done. Discharged after 5 days. Admitted 12/05/22 due to 3-day history of lower abdominal pain 9 out of 10 at its worst.  Associated with nausea, intermittent vomiting, abdominal bloating, constipation and absence of flatus. Workup in the ED revealed SBO seen on CT scan. NG tube placement was attempted however the patient did not tolerate. Had bowel movements with improvement of symptoms. Surgical consult done. Was in the ED 03/24/23 due to SOB for ~ [redacted] weeks along with a cough. CXR, BNP are normal. Troponins are negative. BMP/ CBC showed nothing acute. Unable to wean off oxygen.   Echo 08/10/21: EF 60-65% with Grade I DD, mild/ moderate RAE Echo 12/24/21: EF 50-55% along with mild LVH and trivial Ruben.   He presents today for a HF follow-up visit with a chief complaint of minimal fatigue with moderate exertion. Chronic in nature and his wife says that he sleeps "all the time, daytime/ nighttime". Has associated intermittent dizziness, sore throat, congestion and pedal edema along with this. Was seen earlier today by his PCP for upper respiratory symptoms. Patient and wife feel like his leg edema is better. Denies shortness of breath,  chest pain, palpitations, abdominal distention or weight gain.    At last visit, torsemide 20mg  daily was started. No issues with taking it daily that he's aware of. No longer adding salt to his food.   ROS: All systems negative except as listed in HPI, PMH and Problem List.  SH:  Social History   Socioeconomic History   Marital status: Married    Spouse name: Not on file   Number of children: Not on file   Years of education: Not on file   Highest education level: Not on file  Occupational History   Not on file  Tobacco Use   Smoking status: Former    Current packs/day: 0.00    Types: Cigarettes    Quit date: 11/30/1998    Years since quitting: 24.5   Smokeless tobacco: Former   Tobacco comments:    quit 10 years ago  Vaping Use   Vaping status: Never Used  Substance and Sexual Activity   Alcohol use: Not Currently    Comment: occasional   Drug use: No   Sexual activity: Not on file  Other Topics Concern   Not on file  Social History Narrative   Not on file   Social Drivers of Health   Financial Resource Strain: Low Risk  (03/07/2023)   Received from [facility]   Overall Financial Resource Strain (CARDIA)    Difficulty of Paying Living Expenses: Not hard at all  Food Insecurity: No Food Insecurity (03/07/2023)   Received from [facility]   Hunger Vital Sign    Worried About Running Out of Food in the Last Year:  Never true    Ran Out of Food in the Last Year: Never true  Transportation Needs: No Transportation Needs (03/07/2023)   Received from [facility] - Transportation    In the past 12 months, has lack of transportation kept you from medical appointments or from getting medications?: No    Lack of Transportation (Non-Medical): No  Physical Activity: Unknown (04/23/2017)   Received from [facility], [facility]   Exercise Vital Sign    Days of Exercise per Week:  Patient declined    Minutes of Exercise per Session: Patient declined  Stress: Unknown (04/23/2017)   Received from [facility], [facility]   [employer] of Occupational Health - Occupational Stress Questionnaire    Feeling of Stress : Patient declined  Social Connections: Unknown (04/23/2017)   Received from [facility], [facility]   Social Connection and Isolation Panel [NHANES]    Frequency of Communication with Friends and Family: Patient declined    Frequency of Social Gatherings with Friends and Family: Patient declined    Attends Religious Services: Patient declined    Active Member of Clubs or Organizations: Patient declined    Attends Banker Meetings: Patient declined    Marital Status: Patient declined  Intimate Partner Violence: Not At Risk (12/06/2022)   Humiliation, Afraid, Rape, and Kick questionnaire    Fear of Current or Ex-Partner: No    Emotionally Abused: No    Physically Abused: No    Sexually Abused: No    FH:  Family History  Problem Relation Age of Onset   Cancer Brother        2000   Prostate cancer Neg Hx    Bladder Cancer Neg Hx    Kidney cancer Neg Hx     Past Medical History:  Diagnosis Date   AAA (abdominal aortic aneurysm) (HCC)    a.) s/p repair in 2005   Anemia    Anginal pain (HCC)    Anxiety    Arthritis    B12 deficiency    Bilateral cataracts    a.) s/p BILATERAL extractions in 2018   BPH with obstruction/lower urinary tract symptoms    CAD (coronary artery disease)    Carotid artery stenosis    a.) s/p CEA on the RIGHT   CHF (congestive heart failure) (HCC)    COPD (chronic obstructive pulmonary disease) (HCC)    Diastolic dysfunction 07/29/2019   a.)  TTE 07/29/2019: EF 50-55%; LA mildly enlarged; G1DD.   DOE (dyspnea on exertion)    Elevated PSA    Environmental and seasonal allergies    History of 2019 novel coronavirus disease  (COVID-19)    History of kidney stones    HLD (hyperlipidemia)    HOH (hard of hearing)    HTN (hypertension)    Incomplete bladder emptying    Macular degeneration    Prostate cancer (HCC)    RBBB (right bundle branch block)    Valvular insufficiency    a.) TTE 07/29/2019: LVEF 50-55%; LA mild enlarged; triv AR/PR, mild Ruben, mod TR.    Current Outpatient Medications  Medication Sig Dispense Refill   acetaminophen (TYLENOL) 500 MG tablet Take 1-2 tablets (500-1,000 mg total) by mouth every 6 (six) hours as needed for mild pain. 60 tablet 0   albuterol (VENTOLIN HFA) 108 (90 Base) MCG/ACT inhaler Inhale 2 puffs into the lungs every 4 (four)  hours as needed. 8 g 0   aspirin EC 81 MG tablet Take 1 tablet (81 mg total) by mouth daily. Swallow whole. 30 tablet    cyanocobalamin (VITAMIN B12) 1000 MCG tablet Take 1,000 mcg by mouth daily.     Fluticasone-Umeclidin-Vilant (TRELEGY ELLIPTA) 100-62.5-25 MCG/INH AEPB Inhale 2 puffs into the lungs daily.     iron polysaccharides (NIFEREX) 150 MG capsule Take 150 mg by mouth 2 (two) times daily.     LORazepam (ATIVAN) 0.5 MG tablet Take 0.5 mg by mouth daily as needed for anxiety.     metoprolol succinate (TOPROL-XL) 50 MG 24 hr tablet Take 1 tablet (50 mg total) by mouth daily. Take with or immediately following a meal. 30 tablet 0   tamsulosin (FLOMAX) 0.4 MG CAPS capsule Take 0.4 mg by mouth daily.     torsemide (DEMADEX) 20 MG tablet Take 1 tablet (20 mg total) by mouth daily. 30 tablet 3   No current facility-administered medications for this visit.   Vitals:   06/06/23 1449  BP: 134/83  Pulse: 88  SpO2: (!) 89%  Weight: 165 lb (74.8 kg)  Height: 5\' 2"  (1.575 m)   Wt Readings from Last 3 Encounters:  06/06/23 165 lb (74.8 kg)  05/06/23 171 lb (77.6 kg)  03/24/23 168 lb (76.2 kg)   Lab Results  Component Value Date   CREATININE 1.27 (H) 03/24/2023   CREATININE 1.33 (H) 12/07/2022   CREATININE 1.51 (H) 12/06/2022    PHYSICAL  EXAM:  General:  Well appearing. No resp difficulty HEENT: normal Neck: supple. JVP flat. No lymphadenopathy or thryomegaly appreciated. Cor: PMI normal. Regular rhythm & rate. No rubs, gallops or murmurs. Lungs: clear Abdomen: soft, nontender, nondistended. No hepatosplenomegaly. No bruits or masses.  Extremities: no cyanosis, clubbing, rash, trace pitting edema in bilateral lower legs Neuro: alert & oriented x3, cranial nerves grossly intact. Moves all 4 extremities w/o difficulty. Affect pleasant.   ECG: not done    ASSESSMENT & PLAN:  1: NICM with preserved ejection fraction- - suspect due to HTN/ COPD - NYHA class II - euvolemic today - weighing daily; reminded to call for an overnight weight gain of > 2 pounds or a weekly weight gain of > 5 pounds - weight down 6 pounds from last visit here 1 month ago - Echo 08/10/21: EF 60-65% with Grade I DD, mild/ moderate RAE - Echo 12/24/21: EF 50-55% along with mild LVH and trivial Ruben.  - will need to get echo updated - continue metoprolol succinate 50mg  daily - continue torsemide 20mg  daily - BMET today - elevate legs when sitting for long periods of time - not adding salt to his food - BNP 12/23/21 was 415.5  2: HTN- - BP 134/82 - stop amlodipine; if need BP control, add MRA if possible - saw PCP (Tumey) 12/24; returns 02/25 - BMP 03/24/23 reviewed and showed sodium 138, potassium 4.8, creatinine 1.27 & GFR 55 - BMET today  3: COPD- - saw pulmonology Karna Christmas) 08/23; returns 01/25 - wearing oxygen at bedtime  4: Anemia- - saw hematology Cathie Hoops) 10/24 - last iron infusion was 10/24 - Hg 03/24/23 was 10.8  5: Atrial fibrillation- - saw cardiology Juliann Pares) 06/24 - placed on AVS for him to call his office to get follow-up appointment scheduled.   6: URI- - saw PCP earlier today and was placed on zpack - viral testing was also done earlier today and was negative for flu, RSV, covid   Return in 1  month, sooner if needed.

## 2023-06-06 ENCOUNTER — Ambulatory Visit: Payer: PPO | Attending: Family | Admitting: Family

## 2023-06-06 ENCOUNTER — Encounter: Payer: Self-pay | Admitting: Family

## 2023-06-06 VITALS — BP 134/83 | HR 88 | Ht 62.0 in | Wt 165.0 lb

## 2023-06-06 DIAGNOSIS — I428 Other cardiomyopathies: Secondary | ICD-10-CM | POA: Diagnosis not present

## 2023-06-06 DIAGNOSIS — J449 Chronic obstructive pulmonary disease, unspecified: Secondary | ICD-10-CM | POA: Insufficient documentation

## 2023-06-06 DIAGNOSIS — I110 Hypertensive heart disease with heart failure: Secondary | ICD-10-CM | POA: Diagnosis not present

## 2023-06-06 DIAGNOSIS — Z9981 Dependence on supplemental oxygen: Secondary | ICD-10-CM | POA: Insufficient documentation

## 2023-06-06 DIAGNOSIS — F419 Anxiety disorder, unspecified: Secondary | ICD-10-CM | POA: Diagnosis not present

## 2023-06-06 DIAGNOSIS — E785 Hyperlipidemia, unspecified: Secondary | ICD-10-CM | POA: Insufficient documentation

## 2023-06-06 DIAGNOSIS — Z8679 Personal history of other diseases of the circulatory system: Secondary | ICD-10-CM | POA: Insufficient documentation

## 2023-06-06 DIAGNOSIS — D631 Anemia in chronic kidney disease: Secondary | ICD-10-CM

## 2023-06-06 DIAGNOSIS — I10 Essential (primary) hypertension: Secondary | ICD-10-CM

## 2023-06-06 DIAGNOSIS — N189 Chronic kidney disease, unspecified: Secondary | ICD-10-CM

## 2023-06-06 DIAGNOSIS — J029 Acute pharyngitis, unspecified: Secondary | ICD-10-CM | POA: Diagnosis not present

## 2023-06-06 DIAGNOSIS — I480 Paroxysmal atrial fibrillation: Secondary | ICD-10-CM

## 2023-06-06 DIAGNOSIS — K56609 Unspecified intestinal obstruction, unspecified as to partial versus complete obstruction: Secondary | ICD-10-CM | POA: Insufficient documentation

## 2023-06-06 DIAGNOSIS — Z79899 Other long term (current) drug therapy: Secondary | ICD-10-CM | POA: Diagnosis not present

## 2023-06-06 DIAGNOSIS — I2510 Atherosclerotic heart disease of native coronary artery without angina pectoris: Secondary | ICD-10-CM | POA: Insufficient documentation

## 2023-06-06 DIAGNOSIS — J9611 Chronic respiratory failure with hypoxia: Secondary | ICD-10-CM | POA: Diagnosis not present

## 2023-06-06 DIAGNOSIS — Z87891 Personal history of nicotine dependence: Secondary | ICD-10-CM | POA: Diagnosis not present

## 2023-06-06 DIAGNOSIS — R5383 Other fatigue: Secondary | ICD-10-CM | POA: Insufficient documentation

## 2023-06-06 DIAGNOSIS — I5032 Chronic diastolic (congestive) heart failure: Secondary | ICD-10-CM | POA: Diagnosis not present

## 2023-06-06 DIAGNOSIS — J069 Acute upper respiratory infection, unspecified: Secondary | ICD-10-CM | POA: Diagnosis not present

## 2023-06-06 DIAGNOSIS — D649 Anemia, unspecified: Secondary | ICD-10-CM | POA: Diagnosis not present

## 2023-06-06 NOTE — Patient Instructions (Addendum)
DISCONTINUE AMLODIPINE  Go DOWN to LOWER LEVEL (LL) to have your blood work completed inside of [employer] office.  We will only call you if the results are abnormal or if the provider would like to make medication changes.  Please call Dr. Glennis Brink office (cardiology) at [facility] at [phone] to get a follow-up appointment scheduled.

## 2023-06-07 LAB — BASIC METABOLIC PANEL
BUN/Creatinine Ratio: 39 — ABNORMAL HIGH (ref 10–24)
BUN: 64 mg/dL — ABNORMAL HIGH (ref 8–27)
CO2: 21 mmol/L (ref 20–29)
Calcium: 9.6 mg/dL (ref 8.6–10.2)
Chloride: 101 mmol/L (ref 96–106)
Creatinine, Ser: 1.66 mg/dL — ABNORMAL HIGH (ref 0.76–1.27)
Glucose: 136 mg/dL — ABNORMAL HIGH (ref 70–99)
Potassium: 4.5 mmol/L (ref 3.5–5.2)
Sodium: 140 mmol/L (ref 134–144)
eGFR: 40 mL/min/{1.73_m2} — ABNORMAL LOW (ref >59)

## 2023-06-10 ENCOUNTER — Telehealth: Payer: Self-pay

## 2023-06-10 DIAGNOSIS — I5032 Chronic diastolic (congestive) heart failure: Secondary | ICD-10-CM

## 2023-06-10 NOTE — Telephone Encounter (Signed)
-----   Message from Delma Freeze sent at 06/07/2023  6:52 AM EST ----- Kidney function has worsened some since beginning daily torsemide which can happen. Recheck BMET in 2 weeks. Drink 60-64 ounces of fluid daily.

## 2023-06-17 ENCOUNTER — Other Ambulatory Visit
Admission: RE | Admit: 2023-06-17 | Discharge: 2023-06-17 | Disposition: A | Discharge status: Home / Self Care | Payer: PPO | Source: Ambulatory Visit | Attending: Nurse Practitioner | Admitting: Nurse Practitioner

## 2023-06-17 DIAGNOSIS — I214 Non-ST elevation (NSTEMI) myocardial infarction: Secondary | ICD-10-CM | POA: Diagnosis not present

## 2023-06-17 DIAGNOSIS — I2510 Atherosclerotic heart disease of native coronary artery without angina pectoris: Secondary | ICD-10-CM | POA: Diagnosis not present

## 2023-06-17 DIAGNOSIS — I10 Essential (primary) hypertension: Secondary | ICD-10-CM | POA: Diagnosis not present

## 2023-06-17 DIAGNOSIS — E782 Mixed hyperlipidemia: Secondary | ICD-10-CM | POA: Diagnosis not present

## 2023-06-17 DIAGNOSIS — N179 Acute kidney failure, unspecified: Secondary | ICD-10-CM | POA: Diagnosis not present

## 2023-06-17 DIAGNOSIS — I714 Abdominal aortic aneurysm, without rupture, unspecified: Secondary | ICD-10-CM | POA: Diagnosis not present

## 2023-06-17 DIAGNOSIS — D649 Anemia, unspecified: Secondary | ICD-10-CM | POA: Diagnosis not present

## 2023-06-17 DIAGNOSIS — N1831 Chronic kidney disease, stage 3a: Secondary | ICD-10-CM | POA: Diagnosis not present

## 2023-06-17 DIAGNOSIS — I499 Cardiac arrhythmia, unspecified: Secondary | ICD-10-CM | POA: Diagnosis not present

## 2023-06-17 DIAGNOSIS — I5032 Chronic diastolic (congestive) heart failure: Secondary | ICD-10-CM | POA: Diagnosis not present

## 2023-06-17 LAB — BRAIN NATRIURETIC PEPTIDE: B Natriuretic Peptide: 111.8 pg/mL — ABNORMAL HIGH (ref 0.0–100.0)

## 2023-06-18 DIAGNOSIS — H353221 Exudative age-related macular degeneration, left eye, with active choroidal neovascularization: Secondary | ICD-10-CM | POA: Diagnosis not present

## 2023-06-23 DIAGNOSIS — I5032 Chronic diastolic (congestive) heart failure: Secondary | ICD-10-CM | POA: Diagnosis not present

## 2023-06-23 DIAGNOSIS — J449 Chronic obstructive pulmonary disease, unspecified: Secondary | ICD-10-CM | POA: Diagnosis not present

## 2023-06-24 ENCOUNTER — Other Ambulatory Visit
Admission: RE | Admit: 2023-06-24 | Discharge: 2023-06-24 | Disposition: A | Discharge status: Home / Self Care | Payer: PPO | Attending: Family | Admitting: Family

## 2023-06-24 DIAGNOSIS — I5032 Chronic diastolic (congestive) heart failure: Secondary | ICD-10-CM | POA: Diagnosis not present

## 2023-06-24 LAB — BASIC METABOLIC PANEL
Anion gap: 8 (ref 5–15)
BUN: 51 mg/dL — ABNORMAL HIGH (ref 8–23)
CO2: 25 mmol/L (ref 22–32)
Calcium: 8.7 mg/dL — ABNORMAL LOW (ref 8.9–10.3)
Chloride: 106 mmol/L (ref 98–111)
Creatinine, Ser: 1.33 mg/dL — ABNORMAL HIGH (ref 0.61–1.24)
GFR, Estimated: 52 mL/min — ABNORMAL LOW (ref >60)
Glucose, Bld: 116 mg/dL — ABNORMAL HIGH (ref 70–99)
Potassium: 5.1 mmol/L (ref 3.5–5.1)
Sodium: 139 mmol/L (ref 135–145)

## 2023-07-02 DIAGNOSIS — J9611 Chronic respiratory failure with hypoxia: Secondary | ICD-10-CM | POA: Diagnosis not present

## 2023-07-10 NOTE — Progress Notes (Unsigned)
PCP: Barbette Reichmann, MD (last seen 12/24) Primary Cardiologist: Dorothyann Peng, MD/ Shirley Muscat, NP (last seen 12/24)  Patient declines interpreter and uses his wife if he doesn't understand something  Chief Complaint:   HPI:  Mr Aydelott is a 89 y/o male with a history of AAA, small bowel obstruction, carotid disease, CAD, hyperlipidemia, HTN, atrial fibrillation, anemia, anxiety, BPD, COPD, prostate cancer, valvular insufficiency, previous tobacco use and chronic heart failure.   Admitted 12/23/21 due to acute respiratory distress. Placed on bipap. Initially given IV lasix with transition to oral diuretics. Cardiology and pulmonology consults obtained. Given IV antibiotics and nebulizers. Transitioned off bipap to oxygen at 3L but could not be weaned off of it. PT/OT evaluations done. Discharged after 5 days. Admitted 12/05/22 due to 3-day history of lower abdominal pain 9 out of 10 at its worst.  Associated with nausea, intermittent vomiting, abdominal bloating, constipation and absence of flatus. Workup in the ED revealed SBO seen on CT scan. NG tube placement was attempted however the patient did not tolerate. Had bowel movements with improvement of symptoms. Surgical consult done. Was in the ED 03/24/23 due to SOB for ~ [redacted] weeks along with a cough. CXR, BNP are normal. Troponins are negative. BMP/ CBC showed nothing acute. Unable to wean off oxygen.   Echo 08/10/21: EF 60-65% with Grade I DD, mild/ moderate RAE Echo 12/24/21: EF 50-55% along with mild LVH and trivial MR.   He presents today for a HF follow-up visit with a chief complaint of   At last visit, amlodipine was stopped.  ROS: All systems negative except as listed in HPI, PMH and Problem List.  SH:  Social History   Socioeconomic History   Marital status: Married    Spouse name: Not on file   Number of children: Not on file   Years of education: Not on file   Highest education level: Not on file  Occupational  History   Not on file  Tobacco Use   Smoking status: Former    Current packs/day: 0.00    Types: Cigarettes    Quit date: 11/30/1998    Years since quitting: 24.6   Smokeless tobacco: Former   Tobacco comments:    quit 10 years ago  Vaping Use   Vaping status: Never Used  Substance and Sexual Activity   Alcohol use: Not Currently    Comment: occasional   Drug use: No   Sexual activity: Not on file  Other Topics Concern   Not on file  Social History Narrative   Not on file   Social Drivers of Health   Financial Resource Strain: Low Risk  (03/07/2023)   Received from [facility]   Overall Financial Resource Strain (CARDIA)    Difficulty of Paying Living Expenses: Not hard at all  Food Insecurity: No Food Insecurity (03/07/2023)   Received from [facility]   Hunger Vital Sign    Worried About Running Out of Food in the Last Year: Never true    Ran Out of Food in the Last Year: Never true  Transportation Needs: No Transportation Needs (03/07/2023)   Received from [facility] - Transportation    In the past 12 months, has lack of transportation kept you from medical appointments or from getting medications?: No    Lack of Transportation (Non-Medical): No  Physical Activity: Unknown (04/23/2017)   Received from [facility], [facility]  Exercise Vital Sign    Days of Exercise per Week: Patient declined    Minutes of Exercise per Session: Patient declined  Stress: Unknown (04/23/2017)   Received from [facility], [facility]   [employer] of Occupational Health - Occupational Stress Questionnaire    Feeling of Stress : Patient declined  Social Connections: Unknown (04/23/2017)   Received from [facility], [facility]   Social Connection and Isolation Panel [NHANES]    Frequency of Communication with  Friends and Family: Patient declined    Frequency of Social Gatherings with Friends and Family: Patient declined    Attends Religious Services: Patient declined    Active Member of Clubs or Organizations: Patient declined    Attends Banker Meetings: Patient declined    Marital Status: Patient declined  Intimate Partner Violence: Not At Risk (12/06/2022)   Humiliation, Afraid, Rape, and Kick questionnaire    Fear of Current or Ex-Partner: No    Emotionally Abused: No    Physically Abused: No    Sexually Abused: No    FH:  Family History  Problem Relation Age of Onset   Cancer Brother        2000   Prostate cancer Neg Hx    Bladder Cancer Neg Hx    Kidney cancer Neg Hx     Past Medical History:  Diagnosis Date   AAA (abdominal aortic aneurysm) (HCC)    a.) s/p repair in 2005   Anemia    Anginal pain (HCC)    Anxiety    Arthritis    B12 deficiency    Bilateral cataracts    a.) s/p BILATERAL extractions in 2018   BPH with obstruction/lower urinary tract symptoms    CAD (coronary artery disease)    Carotid artery stenosis    a.) s/p CEA on the RIGHT   CHF (congestive heart failure) (HCC)    COPD (chronic obstructive pulmonary disease) (HCC)    Diastolic dysfunction 07/29/2019   a.)  TTE 07/29/2019: EF 50-55%; LA mildly enlarged; G1DD.   DOE (dyspnea on exertion)    Elevated PSA    Environmental and seasonal allergies    History of 2019 novel coronavirus disease (COVID-19)    History of kidney stones    HLD (hyperlipidemia)    HOH (hard of hearing)    HTN (hypertension)    Incomplete bladder emptying    Macular degeneration    Prostate cancer (HCC)    RBBB (right bundle branch block)    Valvular insufficiency    a.) TTE 07/29/2019: LVEF 50-55%; LA mild enlarged; triv AR/PR, mild MR, mod TR.    Current Outpatient Medications  Medication Sig Dispense Refill   acetaminophen (TYLENOL) 500 MG tablet Take 1-2 tablets (500-1,000 mg total) by mouth every 6  (six) hours as needed for mild pain. 60 tablet 0   albuterol (VENTOLIN HFA) 108 (90 Base) MCG/ACT inhaler Inhale 2 puffs into the lungs every 4 (four) hours as needed. 8 g 0   aspirin EC 81 MG tablet Take 1 tablet (81 mg total) by mouth daily. Swallow whole. 30 tablet    atorvastatin (LIPITOR) 40 MG tablet Take 40 mg by mouth daily.     Fluticasone-Umeclidin-Vilant (TRELEGY ELLIPTA) 100-62.5-25 MCG/INH AEPB Inhale 2 puffs into the lungs daily.     iron polysaccharides (NIFEREX) 150 MG capsule Take 150 mg by mouth 2 (two) times daily.     LORazepam (ATIVAN) 0.5 MG  tablet Take 0.5 mg by mouth daily as needed for anxiety.     metoprolol succinate (TOPROL-XL) 50 MG 24 hr tablet Take 1 tablet (50 mg total) by mouth daily. Take with or immediately following a meal. 30 tablet 0   tamsulosin (FLOMAX) 0.4 MG CAPS capsule Take 0.4 mg by mouth daily.     torsemide (DEMADEX) 20 MG tablet Take 1 tablet (20 mg total) by mouth daily. 30 tablet 3   No current facility-administered medications for this visit.      PHYSICAL EXAM:  General:  Well appearing. No resp difficulty HEENT: normal Neck: supple. JVP flat. No lymphadenopathy or thryomegaly appreciated. Cor: PMI normal. Regular rhythm & rate. No rubs, gallops or murmurs. Lungs: clear Abdomen: soft, nontender, nondistended. No hepatosplenomegaly. No bruits or masses.  Extremities: no cyanosis, clubbing, rash, trace pitting edema in bilateral lower legs Neuro: alert & oriented x3, cranial nerves grossly intact. Moves all 4 extremities w/o difficulty. Affect pleasant.   ECG: not done    ASSESSMENT & PLAN:  1: NICM with preserved ejection fraction- - suspect due to HTN/ COPD - NYHA class II - euvolemic today - weighing daily; reminded to call for an overnight weight gain of > 2 pounds or a weekly weight gain of > 5 pounds - weight 165 pounds from last visit here 1 month ago - Echo 08/10/21: EF 60-65% with Grade I DD, mild/ moderate RAE - Echo  12/24/21: EF 50-55% along with mild LVH and trivial MR.  - will need to get echo updated - continue metoprolol succinate 50mg  daily - continue torsemide 20mg  daily - elevate legs when sitting for long periods of time - not adding salt to his food - BNP 06/17/23 was 111.8  2: HTN- - BP  - if need BP control, add MRA if possible - saw PCP Mel Almond) 12/24; returns 02/25 - BMP 06/24/23 reviewed and showed sodium 139, potassium 5.1, creatinine 1.33 & GFR 52  3: COPD- - saw pulmonology Karna Christmas) 01/25 - wearing oxygen at bedtime  4: Anemia- - saw hematology Cathie Hoops) 10/24 - last iron infusion was 10/24 - Hg 03/24/23 was 10.8  5: Atrial fibrillation- - saw cardiology Ann Maki) 12/24

## 2023-07-11 ENCOUNTER — Ambulatory Visit: Payer: PPO | Admitting: Family

## 2023-07-11 ENCOUNTER — Encounter: Payer: Self-pay | Admitting: Family

## 2023-07-11 ENCOUNTER — Other Ambulatory Visit
Admission: RE | Admit: 2023-07-11 | Discharge: 2023-07-11 | Disposition: A | Discharge status: Home / Self Care | Payer: PPO | Source: Ambulatory Visit | Attending: Family | Admitting: Family

## 2023-07-11 VITALS — BP 151/61 | HR 71 | Wt 171.0 lb

## 2023-07-11 DIAGNOSIS — N189 Chronic kidney disease, unspecified: Secondary | ICD-10-CM

## 2023-07-11 DIAGNOSIS — I10 Essential (primary) hypertension: Secondary | ICD-10-CM | POA: Diagnosis not present

## 2023-07-11 DIAGNOSIS — D631 Anemia in chronic kidney disease: Secondary | ICD-10-CM

## 2023-07-11 DIAGNOSIS — I5032 Chronic diastolic (congestive) heart failure: Secondary | ICD-10-CM | POA: Insufficient documentation

## 2023-07-11 DIAGNOSIS — I480 Paroxysmal atrial fibrillation: Secondary | ICD-10-CM

## 2023-07-11 DIAGNOSIS — J449 Chronic obstructive pulmonary disease, unspecified: Secondary | ICD-10-CM

## 2023-07-11 LAB — BASIC METABOLIC PANEL
Anion gap: 13 (ref 5–15)
BUN: 74 mg/dL — ABNORMAL HIGH (ref 8–23)
CO2: 23 mmol/L (ref 22–32)
Calcium: 8.8 mg/dL — ABNORMAL LOW (ref 8.9–10.3)
Chloride: 103 mmol/L (ref 98–111)
Creatinine, Ser: 1.72 mg/dL — ABNORMAL HIGH (ref 0.61–1.24)
GFR, Estimated: 38 mL/min — ABNORMAL LOW (ref >60)
Glucose, Bld: 123 mg/dL — ABNORMAL HIGH (ref 70–99)
Potassium: 5.2 mmol/L — ABNORMAL HIGH (ref 3.5–5.1)
Sodium: 139 mmol/L (ref 135–145)

## 2023-07-11 LAB — BRAIN NATRIURETIC PEPTIDE: B Natriuretic Peptide: 64.2 pg/mL (ref 0.0–100.0)

## 2023-07-11 MED ORDER — EMPAGLIFLOZIN 10 MG PO TABS: 10.0000 mg | ORAL_TABLET | Freq: Every day | ORAL | 11 refills | Status: DC

## 2023-07-11 NOTE — Patient Instructions (Signed)
START JARDIANCE 10 MG ONCE DAILY  Go over to the [facility]. Go pass the gift shop and have your blood work completed.  We will only call you if the results are abnormal or if the provider would like to make medication changes.

## 2023-07-12 ENCOUNTER — Telehealth: Payer: Self-pay

## 2023-07-12 DIAGNOSIS — I5032 Chronic diastolic (congestive) heart failure: Secondary | ICD-10-CM

## 2023-07-12 NOTE — Telephone Encounter (Signed)
-----   Message from Delma Freeze sent at 07/12/2023  9:25 AM EST ----- Kidney function is a little worse and potassium level is rising. Make sure not using NoSalt and decrease consumption of high potassium foods, such as bananas, potatoes, dark green leafy vegetables or oranges. Starting the jardiance should help decrease the potassium. Drink 60-64 ounces of fluid daily.   Recheck BMET in 2 weeks.

## 2023-07-13 IMAGING — US US EXTREM LOW VENOUS*R*
1 series · 14 of 24 positions shown · non-contrast
Comparison: None.

CLINICAL DATA: Right lower extremity swelling for 2 weeks, pain,
straight bladder and prostate cancer

EXAM:
RIGHT LOWER EXTREMITY VENOUS DOPPLER ULTRASOUND
TECHNIQUE: Gray-scale sonography with compression, as well as color and duplex
ultrasound, were performed to evaluate the deep venous system(s)
from the level of the common femoral vein through the popliteal and
proximal calf veins.

[Series 1: us venous img lower uni right (dvt) · portal-venous · 14 of 33 slices shown]
[im 1/33]
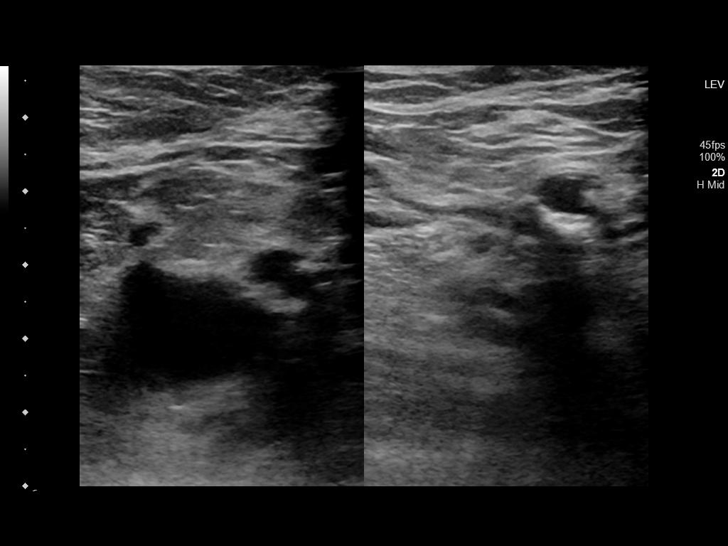
[im 3/33]
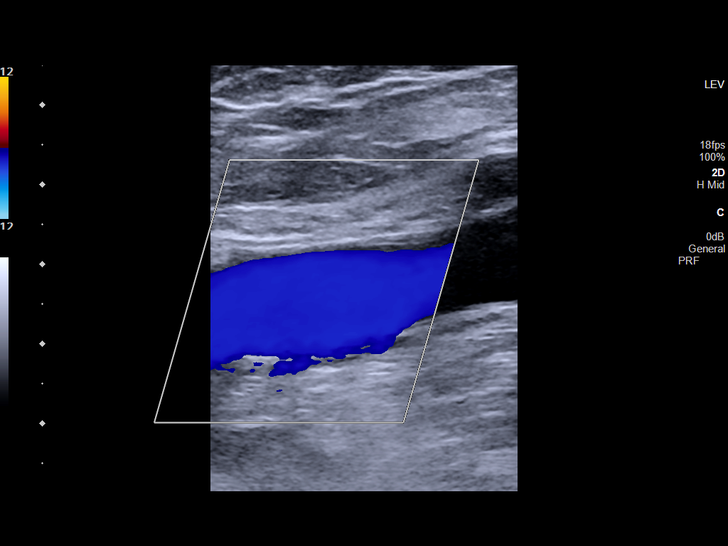
[im 6/33]
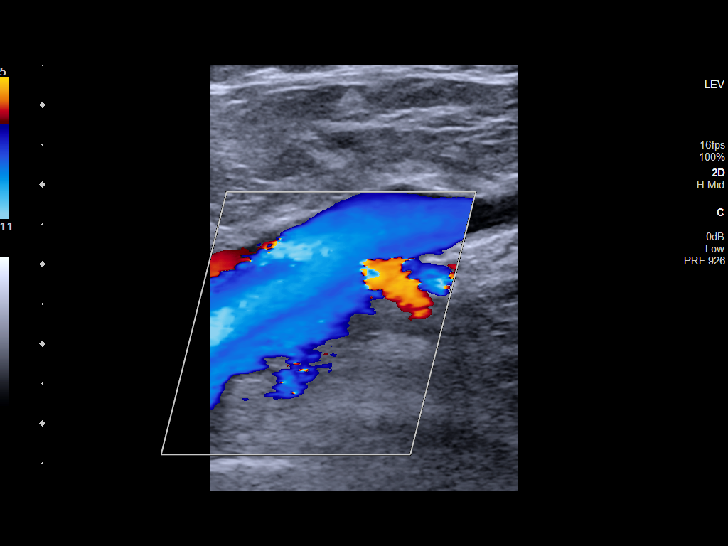
[im 9/33]
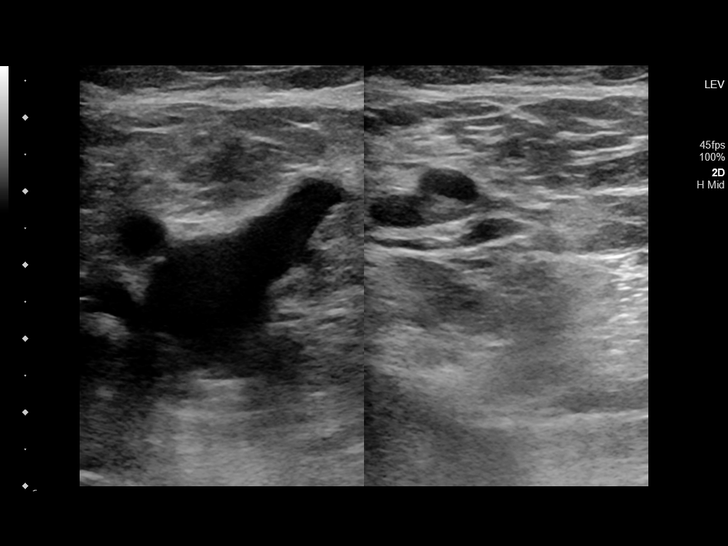
[im 10/33]
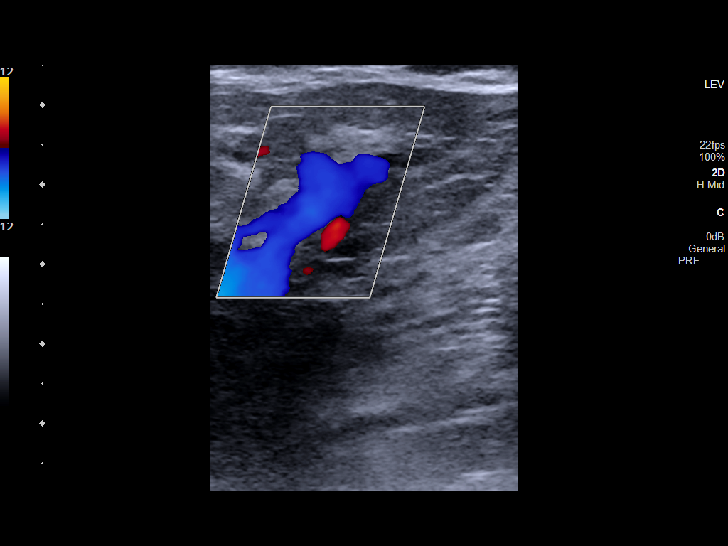
[im 13/33]
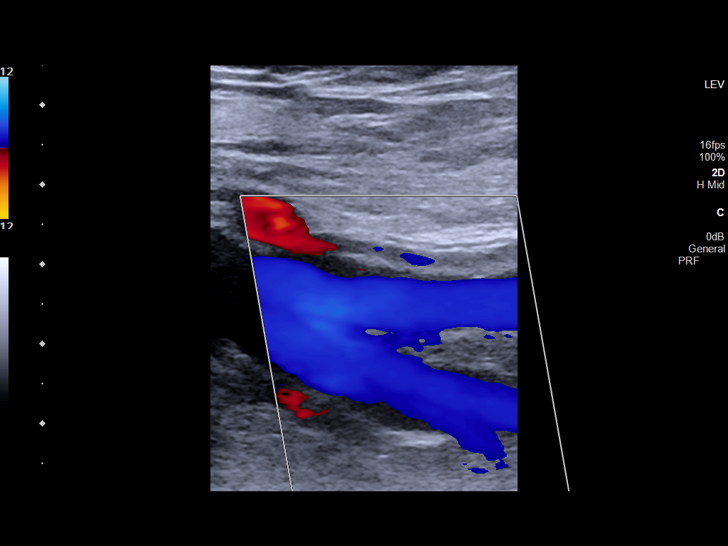
[im 16/33]
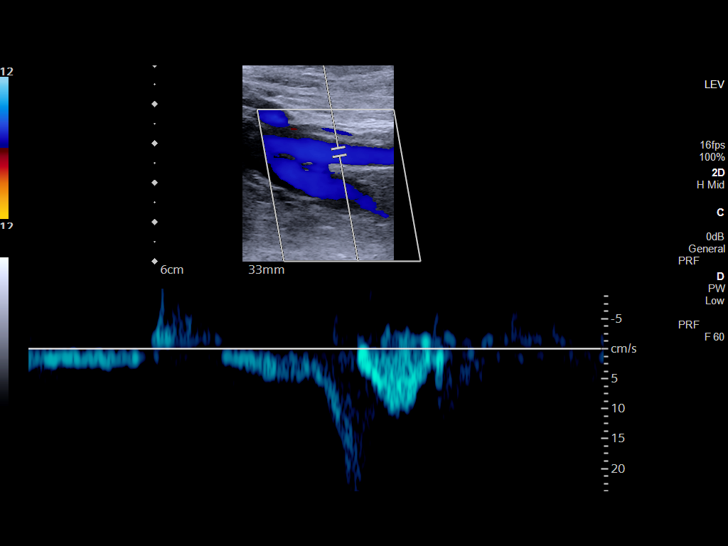
[im 17/33]
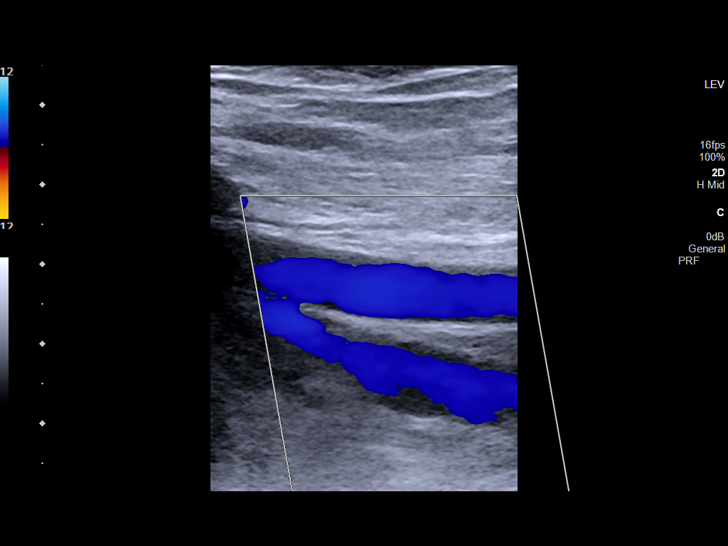
[im 20/33]
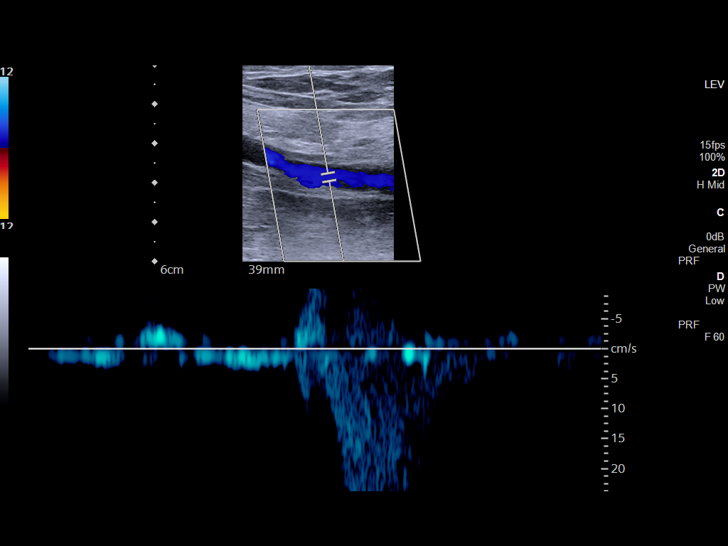
[im 23/33]
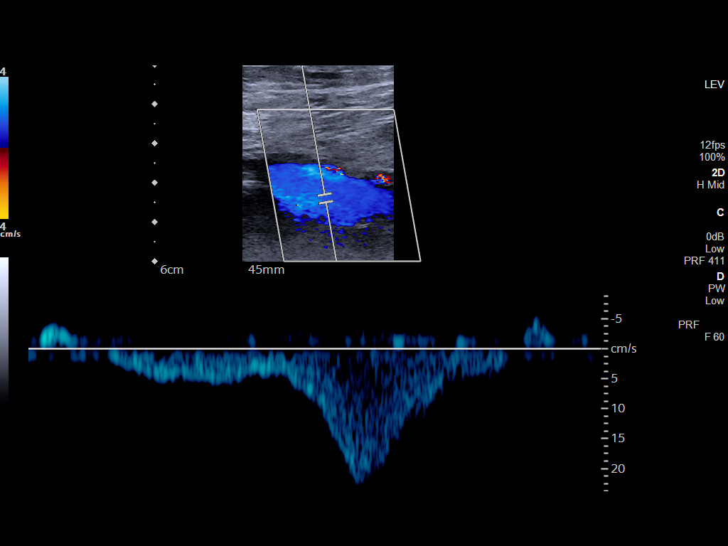
[im 26/33]
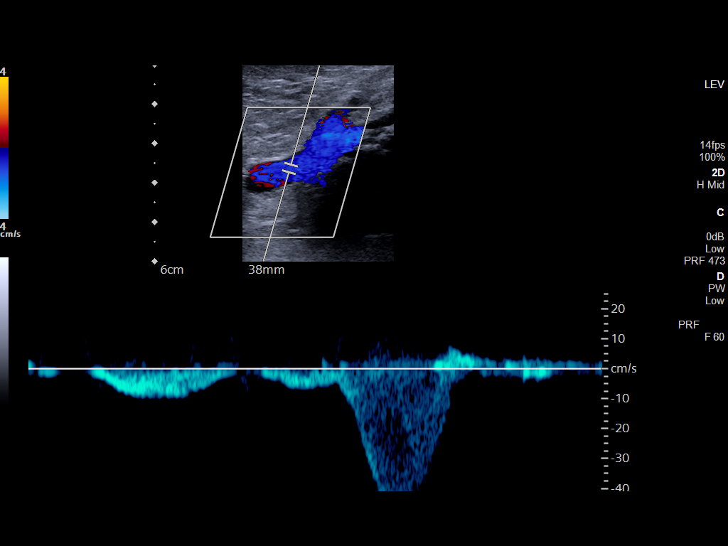
[im 27/33]
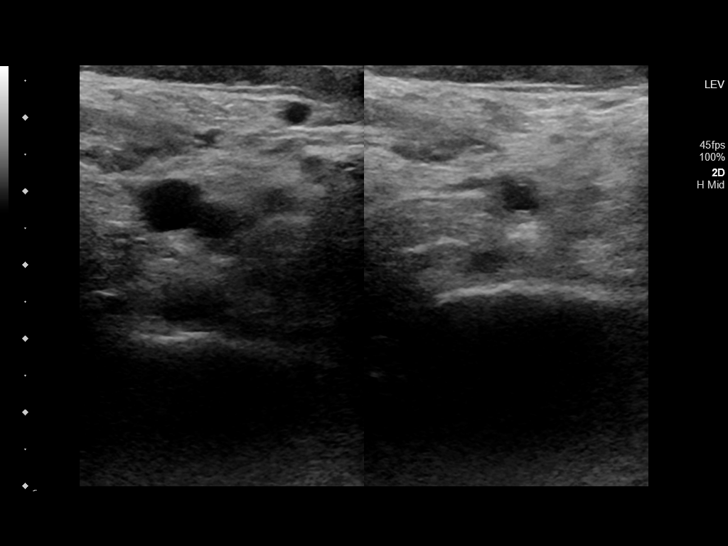
[im 30/33]
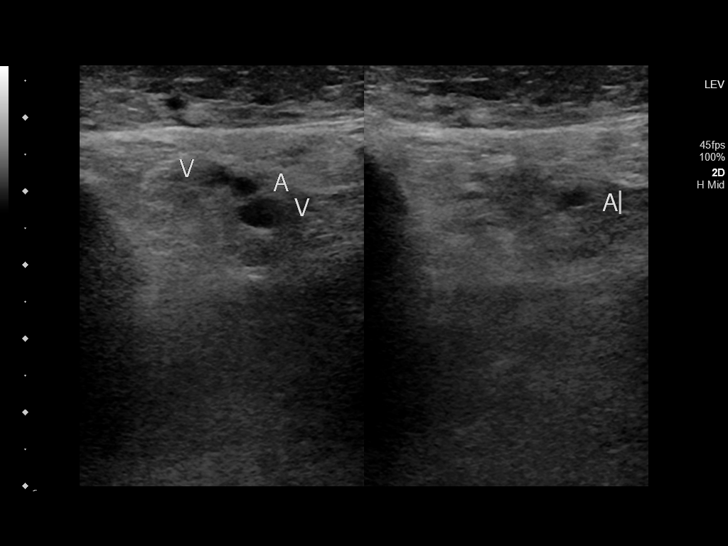
[im 33/33]
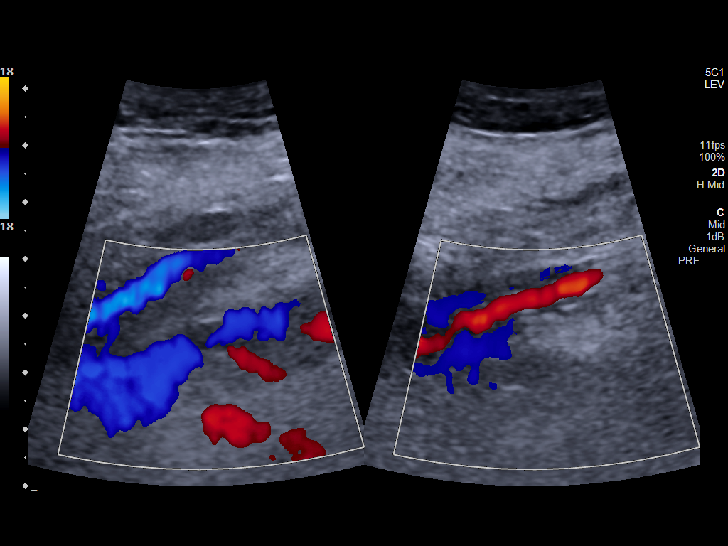

[14 of 24 positions shown; findings below may reference images not displayed]

FINDINGS: VENOUS

Normal compressibility of the common femoral, superficial femoral,
and popliteal veins, as well as the visualized calf veins.
Visualized portions of profunda femoral vein and great saphenous
vein unremarkable. No filling defects to suggest DVT on grayscale or
color Doppler imaging. Doppler waveforms show normal direction of
venous flow, normal respiratory plasticity and response to
augmentation.

Limited views of the contralateral common femoral vein are
unremarkable.

OTHER

None.

Limitations: none
IMPRESSION: 1. No evidence of deep venous thrombosis within the right lower
extremity.

## 2023-07-15 DIAGNOSIS — D649 Anemia, unspecified: Secondary | ICD-10-CM | POA: Diagnosis not present

## 2023-07-15 DIAGNOSIS — R7309 Other abnormal glucose: Secondary | ICD-10-CM | POA: Diagnosis not present

## 2023-07-15 DIAGNOSIS — J449 Chronic obstructive pulmonary disease, unspecified: Secondary | ICD-10-CM | POA: Diagnosis not present

## 2023-07-15 DIAGNOSIS — D090 Carcinoma in situ of bladder: Secondary | ICD-10-CM | POA: Diagnosis not present

## 2023-07-15 DIAGNOSIS — E538 Deficiency of other specified B group vitamins: Secondary | ICD-10-CM | POA: Diagnosis not present

## 2023-07-15 DIAGNOSIS — C61 Malignant neoplasm of prostate: Secondary | ICD-10-CM | POA: Diagnosis not present

## 2023-07-15 DIAGNOSIS — I10 Essential (primary) hypertension: Secondary | ICD-10-CM | POA: Diagnosis not present

## 2023-07-22 DIAGNOSIS — R7989 Other specified abnormal findings of blood chemistry: Secondary | ICD-10-CM | POA: Diagnosis not present

## 2023-07-22 DIAGNOSIS — J019 Acute sinusitis, unspecified: Secondary | ICD-10-CM | POA: Diagnosis not present

## 2023-07-22 DIAGNOSIS — N1831 Chronic kidney disease, stage 3a: Secondary | ICD-10-CM | POA: Diagnosis not present

## 2023-07-22 DIAGNOSIS — J449 Chronic obstructive pulmonary disease, unspecified: Secondary | ICD-10-CM | POA: Diagnosis not present

## 2023-07-22 DIAGNOSIS — J9611 Chronic respiratory failure with hypoxia: Secondary | ICD-10-CM | POA: Diagnosis not present

## 2023-07-22 DIAGNOSIS — D090 Carcinoma in situ of bladder: Secondary | ICD-10-CM | POA: Diagnosis not present

## 2023-07-22 DIAGNOSIS — Z0000 Encounter for general adult medical examination without abnormal findings: Secondary | ICD-10-CM | POA: Diagnosis not present

## 2023-07-22 DIAGNOSIS — R7309 Other abnormal glucose: Secondary | ICD-10-CM | POA: Diagnosis not present

## 2023-07-22 DIAGNOSIS — D649 Anemia, unspecified: Secondary | ICD-10-CM | POA: Diagnosis not present

## 2023-07-22 DIAGNOSIS — C61 Malignant neoplasm of prostate: Secondary | ICD-10-CM | POA: Diagnosis not present

## 2023-07-23 DIAGNOSIS — H353221 Exudative age-related macular degeneration, left eye, with active choroidal neovascularization: Secondary | ICD-10-CM | POA: Diagnosis not present

## 2023-07-24 DIAGNOSIS — I5032 Chronic diastolic (congestive) heart failure: Secondary | ICD-10-CM | POA: Diagnosis not present

## 2023-07-24 DIAGNOSIS — J449 Chronic obstructive pulmonary disease, unspecified: Secondary | ICD-10-CM | POA: Diagnosis not present

## 2023-08-12 DIAGNOSIS — G8929 Other chronic pain: Secondary | ICD-10-CM | POA: Diagnosis not present

## 2023-08-12 DIAGNOSIS — M1711 Unilateral primary osteoarthritis, right knee: Secondary | ICD-10-CM | POA: Diagnosis not present

## 2023-08-14 ENCOUNTER — Telehealth: Payer: Self-pay | Admitting: Family

## 2023-08-14 NOTE — Telephone Encounter (Signed)
 Pt confirmed appt for 08/15/23

## 2023-08-14 NOTE — Progress Notes (Unsigned)
 Advanced [facility] Failure Clinic Note   PCP: Barbette Reichmann, MD (last seen 12/24) Primary Cardiologist: Dorothyann Peng, MD/ Shirley Muscat, NP (last seen 12/24)   Patient declines interpreter and uses his wife if he doesn't understand something   Chief Complaint: pedal edema  HPI:  Ruben Green is a 89 y/o male with a history of AAA, small bowel obstruction, carotid disease, CAD, hyperlipidemia, HTN, atrial fibrillation, anemia, anxiety, BPD, COPD, prostate cancer, valvular insufficiency, previous tobacco use and chronic heart failure.   Admitted 12/23/21 due to acute respiratory distress. Placed on bipap. Initially given IV lasix with transition to oral diuretics. Cardiology and pulmonology consults obtained. Given IV antibiotics and nebulizers. Transitioned off bipap to oxygen at 3L but could not be weaned off of it. PT/OT evaluations done. Discharged after 5 days. Admitted 12/05/22 due to 3-day history of lower abdominal pain 9 out of 10 at its worst.  Associated with nausea, intermittent vomiting, abdominal bloating, constipation and absence of flatus. Workup in the ED revealed SBO seen on CT scan. NG tube placement was attempted however the patient did not tolerate. Had bowel movements with improvement of symptoms. Surgical consult done. Was in the ED 03/24/23 due to SOB for ~ [redacted] weeks along with a cough. CXR, BNP are normal. Troponins are negative. BMP/ CBC showed nothing acute. Unable to wean off oxygen.   Echo 08/10/21: EF 60-65% with Grade I DD, mild/ moderate RAE Echo 12/24/21: EF 50-55% along with mild LVH and trivial Ruben.   He presents today, with his wife, for a HF follow-up visit with a chief complaint of pedal edema with R>L. Says that when he wakes up in the morning, his swelling is mostly gone but as the day progresses, the swelling returns. He says that he doesn't take his torsemide every day if they have appt or other things to do but estimates that he takes it 3-5 days/  week. He has associated fatigue/ weakness and gradual weight gain along with this. Denies shortness of breath, chest pain, cough, palpitations, abdominal distention or dizziness. Bumped his left arm along the shower door and took off a layer of skin.   At last visit, amlodipine was stopped. He admits that he's not getting any exercise although his wife says that he has a small pedaling unit that he could use while sitting.   ROS: All systems negative except as listed in HPI, PMH and Problem List.  SH:  Social History   Socioeconomic History   Marital status: Married    Spouse name: Not on file   Number of children: Not on file   Years of education: Not on file   Highest education level: Not on file  Occupational History   Not on file  Tobacco Use   Smoking status: Former    Current packs/day: 0.00    Types: Cigarettes    Quit date: 11/30/1998    Years since quitting: 24.7   Smokeless tobacco: Former   Tobacco comments:    quit 10 years ago  Vaping Use   Vaping status: Never Used  Substance and Sexual Activity   Alcohol use: Not Currently    Comment: occasional   Drug use: No   Sexual activity: Not on file  Other Topics Concern   Not on file  Social History Narrative   Not on file   Social Drivers of Health   Financial Resource Strain: Low Risk  (07/22/2023)   Received from [facility]   Overall  Financial Resource Strain (CARDIA)    Difficulty of Paying Living Expenses: Not hard at all  Food Insecurity: No Food Insecurity (07/22/2023)   Received from [facility]   Hunger Vital Sign    Worried About Running Out of Food in the Last Year: Never true    Ran Out of Food in the Last Year: Never true  Transportation Needs: No Transportation Needs (07/22/2023)   Received from [facility] - Transportation    In the past 12 months, has lack of transportation kept you from medical appointments or from getting  medications?: No    Lack of Transportation (Non-Medical): No  Physical Activity: Unknown (04/23/2017)   Received from [facility], [facility]   Exercise Vital Sign    Days of Exercise per Week: Patient declined    Minutes of Exercise per Session: Patient declined  Stress: Unknown (04/23/2017)   Received from [facility], [facility]   [employer] of Occupational Health - Occupational Stress Questionnaire    Feeling of Stress : Patient declined  Social Connections: Unknown (04/23/2017)   Received from [facility], [facility]   Social Connection and Isolation Panel [NHANES]    Frequency of Communication with Friends and Family: Patient declined    Frequency of Social Gatherings with Friends and Family: Patient declined    Attends Religious Services: Patient declined    Active Member of Clubs or Organizations: Patient declined    Attends Banker Meetings: Patient declined    Marital Status: Patient declined  Intimate Partner Violence: Not At Risk (12/06/2022)   Humiliation, Afraid, Rape, and Kick questionnaire    Fear of Current or Ex-Partner: No    Emotionally Abused: No    Physically Abused: No    Sexually Abused: No    FH:  Family History  Problem Relation Age of Onset   Cancer Brother        2000   Prostate cancer Neg Hx    Bladder Cancer Neg Hx    Kidney cancer Neg Hx     Past Medical History:  Diagnosis Date   AAA (abdominal aortic aneurysm) (HCC)    a.) s/p repair in 2005   Anemia    Anginal pain (HCC)    Anxiety    Arthritis    B12 deficiency    Bilateral cataracts    a.) s/p BILATERAL extractions in 2018   BPH with obstruction/lower urinary tract symptoms    CAD (coronary artery disease)    Carotid artery stenosis    a.) s/p CEA on the RIGHT   CHF (congestive heart failure) (HCC)    COPD (chronic obstructive pulmonary disease) (HCC)     Diastolic dysfunction 07/29/2019   a.)  TTE 07/29/2019: EF 50-55%; LA mildly enlarged; G1DD.   DOE (dyspnea on exertion)    Elevated PSA    Environmental and seasonal allergies    History of 2019 novel coronavirus disease (COVID-19)    History of kidney stones    HLD (hyperlipidemia)    HOH (hard of hearing)    HTN (hypertension)    Incomplete bladder emptying    Macular degeneration    Prostate cancer (HCC)    RBBB (right bundle branch block)    Valvular insufficiency    a.) TTE 07/29/2019: LVEF 50-55%; LA mild enlarged; triv AR/PR, mild Ruben, mod TR.    Current Outpatient Medications  Medication  Sig Dispense Refill   acetaminophen (TYLENOL) 500 MG tablet Take 1-2 tablets (500-1,000 mg total) by mouth every 6 (six) hours as needed for mild pain. 60 tablet 0   albuterol (VENTOLIN HFA) 108 (90 Base) MCG/ACT inhaler Inhale 2 puffs into the lungs every 4 (four) hours as needed. 8 g 0   aspirin EC 81 MG tablet Take 1 tablet (81 mg total) by mouth daily. Swallow whole. 30 tablet    atorvastatin (LIPITOR) 40 MG tablet Take 40 mg by mouth daily.     empagliflozin (JARDIANCE) 10 MG TABS tablet Take 1 tablet (10 mg total) by mouth daily before breakfast. 30 tablet 11   Fluticasone-Umeclidin-Vilant (TRELEGY ELLIPTA) 100-62.5-25 MCG/INH AEPB Inhale 2 puffs into the lungs daily.     iron polysaccharides (NIFEREX) 150 MG capsule Take 150 mg by mouth 2 (two) times daily.     LORazepam (ATIVAN) 0.5 MG tablet Take 0.5 mg by mouth daily as needed for anxiety.     metoprolol succinate (TOPROL-XL) 50 MG 24 hr tablet Take 1 tablet (50 mg total) by mouth daily. Take with or immediately following a meal. 30 tablet 0   tamsulosin (FLOMAX) 0.4 MG CAPS capsule Take 0.4 mg by mouth daily.     torsemide (DEMADEX) 20 MG tablet Take 1 tablet (20 mg total) by mouth daily. 30 tablet 3   No current facility-administered medications for this visit.   There were no vitals filed for this visit.  Wt Readings from  Last 3 Encounters:  07/11/23 171 lb (77.6 kg)  06/06/23 165 lb (74.8 kg)  05/06/23 171 lb (77.6 kg)   Lab Results  Component Value Date   CREATININE 1.72 (H) 07/11/2023   CREATININE 1.33 (H) 06/24/2023   CREATININE 1.66 (H) 06/06/2023   PHYSICAL EXAM:  General:  Well appearing. No resp difficulty HEENT: normal Neck: supple. JVP flat. No lymphadenopathy or thryomegaly appreciated. Cor: PMI normal. Regular rhythm & rate. No rubs, gallops or murmurs. Lungs: clear Abdomen: soft, nontender, nondistended. No hepatosplenomegaly. No bruits or masses.  Extremities: no cyanosis, clubbing, rash, 1+ pitting edema in right lower leg; trace pitting left lower leg; left forearm with layer of skin removed, some bleeding when bandaged removed but no overt signs of infection Neuro: alert & oriented x3, cranial nerves grossly intact. Moves all 4 extremities w/o difficulty. Affect pleasant.   ECG: not done    ASSESSMENT & PLAN:  1: NICM with preserved ejection fraction- - suspect due to HTN/ COPD - NYHA class II - euvolemic today - weighing daily; reminded to call for an overnight weight gain of > 2 pounds or a weekly weight gain of > 5 pounds - weight up 6 pounds from last visit here 1 month ago - Echo 08/10/21: EF 60-65% with Grade I DD, mild/ moderate RAE - Echo 12/24/21: EF 50-55% along with mild LVH and trivial Ruben.  - will need to get echo updated - continue metoprolol succinate 50mg  daily - continue torsemide 20mg  daily although he estimates that he takes it 3-5 days/ week - start jardiance 10mg  daily - BMET today & repeat at next visit - not adding salt to his food - encouraged to use the bike pedaler twice a day; in reviewing pulmonology note, he placed order for home health with PT - BNP 06/17/23 was 111.8  2: HTN- - BP 151/61 - if need BP control, add MRA if possible - saw PCP (Tumey) 12/24; returns 02/25 - BMP 06/24/23 reviewed and showed sodium 139,  potassium 5.1, creatinine 1.33  & GFR 52  3: COPD- - saw pulmonology Karna Christmas) 01/25 - wearing oxygen at bedtime  4: Anemia- - saw hematology Cathie Hoops) 10/24 - last iron infusion was 10/24 - Hg 03/24/23 was 10.8  5: Atrial fibrillation- - saw cardiology Ann Maki) 12/24  Advised to f/u with PCP should left forearm wound not continue to heal or redness surrounding it begin.    Return in 1 month, sooner if needed.      Ruben Freeze, FNP 08/14/23

## 2023-08-15 ENCOUNTER — Ambulatory Visit: Payer: PPO | Attending: Family | Admitting: Family

## 2023-08-15 ENCOUNTER — Encounter: Payer: Self-pay | Admitting: Family

## 2023-08-15 VITALS — BP 143/75 | HR 70 | Wt 163.0 lb

## 2023-08-15 DIAGNOSIS — D649 Anemia, unspecified: Secondary | ICD-10-CM | POA: Insufficient documentation

## 2023-08-15 DIAGNOSIS — N189 Chronic kidney disease, unspecified: Secondary | ICD-10-CM | POA: Diagnosis not present

## 2023-08-15 DIAGNOSIS — Z87891 Personal history of nicotine dependence: Secondary | ICD-10-CM | POA: Insufficient documentation

## 2023-08-15 DIAGNOSIS — E785 Hyperlipidemia, unspecified: Secondary | ICD-10-CM | POA: Diagnosis not present

## 2023-08-15 DIAGNOSIS — I480 Paroxysmal atrial fibrillation: Secondary | ICD-10-CM

## 2023-08-15 DIAGNOSIS — I2510 Atherosclerotic heart disease of native coronary artery without angina pectoris: Secondary | ICD-10-CM | POA: Insufficient documentation

## 2023-08-15 DIAGNOSIS — I5032 Chronic diastolic (congestive) heart failure: Secondary | ICD-10-CM

## 2023-08-15 DIAGNOSIS — R5383 Other fatigue: Secondary | ICD-10-CM | POA: Diagnosis present

## 2023-08-15 DIAGNOSIS — I10 Essential (primary) hypertension: Secondary | ICD-10-CM | POA: Diagnosis not present

## 2023-08-15 DIAGNOSIS — Z8679 Personal history of other diseases of the circulatory system: Secondary | ICD-10-CM | POA: Insufficient documentation

## 2023-08-15 DIAGNOSIS — Z79899 Other long term (current) drug therapy: Secondary | ICD-10-CM | POA: Insufficient documentation

## 2023-08-15 DIAGNOSIS — J449 Chronic obstructive pulmonary disease, unspecified: Secondary | ICD-10-CM | POA: Diagnosis not present

## 2023-08-15 DIAGNOSIS — Z7984 Long term (current) use of oral hypoglycemic drugs: Secondary | ICD-10-CM | POA: Diagnosis not present

## 2023-08-15 DIAGNOSIS — I428 Other cardiomyopathies: Secondary | ICD-10-CM | POA: Insufficient documentation

## 2023-08-15 DIAGNOSIS — Z8546 Personal history of malignant neoplasm of prostate: Secondary | ICD-10-CM | POA: Diagnosis not present

## 2023-08-15 DIAGNOSIS — D631 Anemia in chronic kidney disease: Secondary | ICD-10-CM

## 2023-08-15 DIAGNOSIS — I4891 Unspecified atrial fibrillation: Secondary | ICD-10-CM | POA: Diagnosis not present

## 2023-08-15 DIAGNOSIS — I110 Hypertensive heart disease with heart failure: Secondary | ICD-10-CM | POA: Insufficient documentation

## 2023-08-15 DIAGNOSIS — F419 Anxiety disorder, unspecified: Secondary | ICD-10-CM | POA: Diagnosis not present

## 2023-08-15 NOTE — Patient Instructions (Signed)
 Medication Changes:  No medication changes  Lab Work:  Go DOWN to LOWER LEVEL (LL) to have your blood work completed inside of [employer] office.  We will only call you if the results are abnormal or if the provider would like to make medication changes.   Special Instructions // Education:  Do the following things EVERYDAY: Weigh yourself in the morning before breakfast. Write it down and keep it in a log. Take your medicines as prescribed Eat low salt foods--Limit salt (sodium) to 2000 mg per day.  Stay as active as you can everyday Limit all fluids for the day to less than 2 liters   Follow-Up in: Please follow up with the Advanced Heart Failure Clinic in 4 months. This appointment has been made.  At the [facility], you and your health needs are our priority. We have a designated team specialized in the treatment of Heart Failure. This Care Team includes your primary Heart Failure Specialized Cardiologist (physician), Advanced Practice Providers (APPs- Physician Assistants and Nurse Practitioners), and Pharmacist who all work together to provide you with the care you need, when you need it.   You may see any of the following providers on your designated Care Team at your next follow up:  Dr. Arvilla Meres Dr. Marca Ancona Dr. Dorthula Nettles Dr. Theresia Bough Clarisa Kindred, NP Tonye Becket, NP Robbie Lis, [place] [facility] [place], [place] Brynda Peon, NP [place] Lee, NP Karle Plumber, PharmD   Please be sure to bring in all your medications bottles to every appointment.   Need to Contact us:  If you have any questions or concerns before your next appointment please send [place] a message through [employer] or call our office at [phone].    TO LEAVE A MESSAGE FOR THE NURSE SELECT OPTION 2, PLEASE LEAVE A MESSAGE INCLUDING: YOUR NAME DATE OF BIRTH CALL BACK NUMBER REASON FOR CALL**this is important as we prioritize the call backs  YOU WILL  RECEIVE A CALL BACK THE SAME DAY AS LONG AS YOU CALL BEFORE 4:00 PM

## 2023-08-16 ENCOUNTER — Other Ambulatory Visit: Payer: Self-pay

## 2023-08-16 DIAGNOSIS — I5032 Chronic diastolic (congestive) heart failure: Secondary | ICD-10-CM

## 2023-08-16 LAB — BASIC METABOLIC PANEL
BUN/Creatinine Ratio: 35 — ABNORMAL HIGH (ref 10–24)
BUN: 70 mg/dL — ABNORMAL HIGH (ref 8–27)
CO2: 19 mmol/L — ABNORMAL LOW (ref 20–29)
Calcium: 8.7 mg/dL (ref 8.6–10.2)
Chloride: 99 mmol/L (ref 96–106)
Creatinine, Ser: 2.02 mg/dL — ABNORMAL HIGH (ref 0.76–1.27)
Glucose: 99 mg/dL (ref 70–99)
Potassium: 4.7 mmol/L (ref 3.5–5.2)
Sodium: 134 mmol/L (ref 134–144)
eGFR: 31 mL/min/{1.73_m2} — ABNORMAL LOW (ref >59)

## 2023-08-21 DIAGNOSIS — I5032 Chronic diastolic (congestive) heart failure: Secondary | ICD-10-CM | POA: Diagnosis not present

## 2023-08-21 DIAGNOSIS — J449 Chronic obstructive pulmonary disease, unspecified: Secondary | ICD-10-CM | POA: Diagnosis not present

## 2023-09-03 ENCOUNTER — Other Ambulatory Visit
Admission: RE | Admit: 2023-09-03 | Discharge: 2023-09-03 | Disposition: A | Discharge status: Home / Self Care | Attending: Family | Admitting: Family

## 2023-09-03 DIAGNOSIS — I5032 Chronic diastolic (congestive) heart failure: Secondary | ICD-10-CM | POA: Insufficient documentation

## 2023-09-03 LAB — BASIC METABOLIC PANEL
Anion gap: 7 (ref 5–15)
BUN: 56 mg/dL — ABNORMAL HIGH (ref 8–23)
CO2: 22 mmol/L (ref 22–32)
Calcium: 9 mg/dL (ref 8.9–10.3)
Chloride: 106 mmol/L (ref 98–111)
Creatinine, Ser: 1.5 mg/dL — ABNORMAL HIGH (ref 0.61–1.24)
GFR, Estimated: 45 mL/min — ABNORMAL LOW (ref >60)
Glucose, Bld: 107 mg/dL — ABNORMAL HIGH (ref 70–99)
Potassium: 4.9 mmol/L (ref 3.5–5.1)
Sodium: 135 mmol/L (ref 135–145)

## 2023-09-04 ENCOUNTER — Encounter: Payer: Self-pay | Admitting: Urology

## 2023-09-04 ENCOUNTER — Ambulatory Visit: Payer: PPO | Admitting: Urology

## 2023-09-04 VITALS — BP 156/69 | HR 85 | Ht 62.0 in | Wt 150.0 lb

## 2023-09-04 DIAGNOSIS — Z8551 Personal history of malignant neoplasm of bladder: Secondary | ICD-10-CM | POA: Diagnosis not present

## 2023-09-04 LAB — URINALYSIS, COMPLETE
Bilirubin, UA: NEGATIVE
Ketones, UA: NEGATIVE
Leukocytes,UA: NEGATIVE
Nitrite, UA: NEGATIVE
Protein,UA: NEGATIVE
RBC, UA: NEGATIVE
Specific Gravity, UA: <1.005 — ABNORMAL LOW (ref 1.005–1.030)
Urobilinogen, Ur: 0.2 mg/dL (ref 0.2–1.0)
pH, UA: 5 (ref 5.0–7.5)

## 2023-09-04 LAB — MICROSCOPIC EXAMINATION
Bacteria, UA: NONE SEEN
Epithelial Cells (non renal): NONE SEEN /HPF (ref 0–10)
RBC, Urine: NONE SEEN /HPF (ref 0–2)

## 2023-09-04 NOTE — Progress Notes (Signed)
   09/04/23  CC:  Chief Complaint  Patient presents with   Cysto   Indications: Carcinoma in situ bladder biopsies 02/23/2020 Completed 6 week course of BCG induction 06/13/2020  HPI: No complaints today; denies gross hematuria.  UA today negative microscopy  See rooming tab for vitals NED. A&Ox3.   No respiratory distress   Abd soft, NT, ND Normal phallus with bilateral descended testicles  Cystoscopy Procedure Note  Patient identification was confirmed, informed consent was obtained, and patient was prepped using Betadine solution.  Lidocaine jelly was administered per urethral meatus.     Pre-Procedure: - Inspection reveals a normal caliber urethral meatus.  Procedure: The flexible cystoscope was introduced without difficulty - No urethral strictures/lesions are present. - Moderate lateral lobe enlargement  prostate  - Elevated bladder neck, moderate - Bilateral ureteral orifices identified - Bladder mucosa  reveals no ulcers, tumors, or lesions - No bladder stones -Moderate trabeculation with cellules/diverticula   Retroflexion shows small intravesical prostatic tissue with hypervascularity  Post-Procedure: - Patient tolerated the procedure well  Assessment/ Plan: No bladder mucosal abnormalities/recurrent tumor identified Surveillance cystoscopy 6 months; if he remains tumor free will move to annual surveillance September 2026    Riki Altes, MD

## 2023-09-05 ENCOUNTER — Telehealth: Payer: Self-pay

## 2023-09-05 NOTE — Telephone Encounter (Signed)
 Pt wife called and requested results from labs 09/03/23. Reviewed lab results. Verbalized understanding and agreeable to plan.

## 2023-09-10 ENCOUNTER — Emergency Department
Admission: EM | Admit: 2023-09-10 | Discharge: 2023-09-10 | Disposition: A | Discharge status: Home / Self Care | Attending: Emergency Medicine | Admitting: Emergency Medicine

## 2023-09-10 ENCOUNTER — Other Ambulatory Visit: Payer: Self-pay

## 2023-09-10 ENCOUNTER — Emergency Department

## 2023-09-10 DIAGNOSIS — I2510 Atherosclerotic heart disease of native coronary artery without angina pectoris: Secondary | ICD-10-CM | POA: Insufficient documentation

## 2023-09-10 DIAGNOSIS — I10 Essential (primary) hypertension: Secondary | ICD-10-CM | POA: Diagnosis not present

## 2023-09-10 DIAGNOSIS — I4510 Unspecified right bundle-branch block: Secondary | ICD-10-CM | POA: Diagnosis not present

## 2023-09-10 DIAGNOSIS — M542 Cervicalgia: Secondary | ICD-10-CM | POA: Diagnosis not present

## 2023-09-10 DIAGNOSIS — R109 Unspecified abdominal pain: Secondary | ICD-10-CM | POA: Diagnosis not present

## 2023-09-10 DIAGNOSIS — J449 Chronic obstructive pulmonary disease, unspecified: Secondary | ICD-10-CM | POA: Diagnosis not present

## 2023-09-10 DIAGNOSIS — M546 Pain in thoracic spine: Secondary | ICD-10-CM | POA: Diagnosis not present

## 2023-09-10 DIAGNOSIS — E875 Hyperkalemia: Secondary | ICD-10-CM | POA: Insufficient documentation

## 2023-09-10 DIAGNOSIS — R001 Bradycardia, unspecified: Secondary | ICD-10-CM | POA: Diagnosis not present

## 2023-09-10 DIAGNOSIS — R0989 Other specified symptoms and signs involving the circulatory and respiratory systems: Secondary | ICD-10-CM | POA: Diagnosis not present

## 2023-09-10 DIAGNOSIS — M549 Dorsalgia, unspecified: Secondary | ICD-10-CM | POA: Diagnosis not present

## 2023-09-10 DIAGNOSIS — R9389 Abnormal findings on diagnostic imaging of other specified body structures: Secondary | ICD-10-CM | POA: Diagnosis not present

## 2023-09-10 LAB — CBC WITH DIFFERENTIAL/PLATELET
Abs Immature Granulocytes: 0.05 10*3/uL (ref 0.00–0.07)
Basophils Absolute: 0 10*3/uL (ref 0.0–0.1)
Basophils Relative: 0 %
Eosinophils Absolute: 0.3 10*3/uL (ref 0.0–0.5)
Eosinophils Relative: 4 %
HCT: 36.5 % — ABNORMAL LOW (ref 39.0–52.0)
Hemoglobin: 11.6 g/dL — ABNORMAL LOW (ref 13.0–17.0)
Immature Granulocytes: 1 %
Lymphocytes Relative: 8 %
Lymphs Abs: 0.6 10*3/uL — ABNORMAL LOW (ref 0.7–4.0)
MCH: 30.3 pg (ref 26.0–34.0)
MCHC: 31.8 g/dL (ref 30.0–36.0)
MCV: 95.3 fL (ref 80.0–100.0)
Monocytes Absolute: 0.6 10*3/uL (ref 0.1–1.0)
Monocytes Relative: 9 %
Neutro Abs: 5.8 10*3/uL (ref 1.7–7.7)
Neutrophils Relative %: 78 %
Platelets: 213 10*3/uL (ref 150–400)
RBC: 3.83 MIL/uL — ABNORMAL LOW (ref 4.22–5.81)
RDW: 13.5 % (ref 11.5–15.5)
Smear Review: NORMAL
WBC: 7.3 10*3/uL (ref 4.0–10.5)
nRBC: 0 % (ref 0.0–0.2)

## 2023-09-10 LAB — COMPREHENSIVE METABOLIC PANEL
ALT: 12 U/L (ref 0–44)
AST: 20 U/L (ref 15–41)
Albumin: 3.6 g/dL (ref 3.5–5.0)
Alkaline Phosphatase: 84 U/L (ref 38–126)
Anion gap: 9 (ref 5–15)
BUN: 43 mg/dL — ABNORMAL HIGH (ref 8–23)
CO2: 23 mmol/L (ref 22–32)
Calcium: 9 mg/dL (ref 8.9–10.3)
Chloride: 104 mmol/L (ref 98–111)
Creatinine, Ser: 1.59 mg/dL — ABNORMAL HIGH (ref 0.61–1.24)
GFR, Estimated: 42 mL/min — ABNORMAL LOW (ref >60)
Glucose, Bld: 125 mg/dL — ABNORMAL HIGH (ref 70–99)
Potassium: 5.4 mmol/L — ABNORMAL HIGH (ref 3.5–5.1)
Sodium: 136 mmol/L (ref 135–145)
Total Bilirubin: 0.6 mg/dL (ref 0.0–1.2)
Total Protein: 6.7 g/dL (ref 6.5–8.1)

## 2023-09-10 LAB — LIPASE, BLOOD: Lipase: 31 U/L (ref 11–51)

## 2023-09-10 LAB — TROPONIN I (HIGH SENSITIVITY): Troponin I (High Sensitivity): 10 ng/L (ref <18)

## 2023-09-10 MED ORDER — LIDOCAINE 5 % EX PTCH
1.0000 | MEDICATED_PATCH | Freq: Two times a day (BID) | CUTANEOUS | 0 refills | Status: AC
Start: 2023-09-10 — End: 2024-09-09

## 2023-09-10 MED ORDER — ACETAMINOPHEN 500 MG PO TABS
1000.0000 mg | ORAL_TABLET | Freq: Once | ORAL | Status: AC
  Administered 2023-09-10: 1000 mg via ORAL
  Filled 2023-09-10: qty 2

## 2023-09-10 MED ORDER — METHOCARBAMOL 500 MG PO TABS
500.0000 mg | ORAL_TABLET | Freq: Once | ORAL | Status: AC
  Administered 2023-09-10: 500 mg via ORAL
  Filled 2023-09-10: qty 1

## 2023-09-10 MED ORDER — SODIUM ZIRCONIUM CYCLOSILICATE 10 G PO PACK
10.0000 g | PACK | Freq: Once | ORAL | Status: AC
  Administered 2023-09-10: 10 g via ORAL
  Filled 2023-09-10: qty 1

## 2023-09-10 MED ORDER — METHOCARBAMOL 500 MG PO TABS: 500.0000 mg | ORAL_TABLET | Freq: Three times a day (TID) | ORAL | 0 refills | Status: DC | PRN

## 2023-09-10 MED ORDER — LIDOCAINE 5 % EX PTCH
1.0000 | MEDICATED_PATCH | CUTANEOUS | Status: DC
  Administered 2023-09-10: 1 via TRANSDERMAL
  Filled 2023-09-10: qty 1

## 2023-09-10 NOTE — ED Provider Notes (Signed)
 [facility] Provider Note    Event Date/Time   First MD Initiated Contact with Patient 09/10/23 [phone]     (approximate)   History   Back Pain   HPI  Ruben Green is a 89 y.o. male who presents to the ED for evaluation of Back Pain   I reviewed medical DC summary from last year where he was admitted for 2 days for COPD exacerbation.  History of CAD, HTN, open AAA repair remotely, BPH previous UTI.  Patient mostly speaks [place] and is hard of hearing.  Initially used a video interpreter but his wife soon arrives and provides supplemental history.   They report 2 days of atraumatic thoracic back pain.  Denies any coexisting anterior pain such as chest or abdominal pain, shortness of breath, cough, fevers, emesis or recent illnesses.   Physical Exam   Triage Vital Signs: ED Triage Vitals [09/10/23 0924]  Encounter Vitals Group     BP      Systolic BP Percentile      Diastolic BP Percentile      Pulse      Resp      Temp      Temp src      SpO2      Weight      Height      Head Circumference      Peak Flow      Pain Score 10     Pain Loc      Pain Education      Exclude from Growth Chart     Most recent vital signs: Vitals:   09/10/23 0929  BP: (!) 155/64  Pulse: 98  Resp: 18  Temp: (!) 97.4 F (36.3 C)  SpO2: 99%    General: Awake, no distress.  Able to sit forward with my assistance, right sided paraspinal thoracic tenderness to palpation without overlying skin changes, rash, signs of trauma or swelling. CV:  Good peripheral perfusion.  Resp:  Normal effort.  No wheezing, good airflow Abd:  No distention.  MSK:  No deformity noted.  Neuro:  No focal deficits appreciated. Other:     ED Results / Procedures / Treatments   Labs (all labs ordered are listed, but only abnormal results are displayed) Labs Reviewed  COMPREHENSIVE METABOLIC PANEL - Abnormal; Notable for the following components:      Result Value    Potassium 5.4 (*)    Glucose, Bld 125 (*)    BUN 43 (*)    Creatinine, Ser 1.59 (*)    GFR, Estimated 42 (*)    All other components within normal limits  CBC WITH DIFFERENTIAL/PLATELET - Abnormal; Notable for the following components:   RBC 3.83 (*)    Hemoglobin 11.6 (*)    HCT 36.5 (*)    Lymphs Abs 0.6 (*)    All other components within normal limits  LIPASE, BLOOD  URINALYSIS, ROUTINE W REFLEX MICROSCOPIC  TROPONIN I (HIGH SENSITIVITY)  TROPONIN I (HIGH SENSITIVITY)    EKG Sinus rhythm, irregular and mostly in a bigeminy pattern, right bundle branch block.  No clear signs of acute ischemia.  RADIOLOGY CXR interpreted by me without evidence of acute cardiopulmonary pathology.  Official radiology report(s): DG Chest Port 1 View Result Date: 09/10/2023 CLINICAL DATA:  Neck pain.  Right flank pain. EXAM: PORTABLE CHEST 1 VIEW COMPARISON:  03/24/2023. FINDINGS: Low lung volume. Bilateral lung fields are clear. Bilateral costophrenic angles are clear. Note is made  of elevated left hemidiaphragm. Stable cardio-mediastinal silhouette. No acute osseous abnormalities. The soft tissues are within normal limits. IMPRESSION: No active disease. Electronically Signed   By: Jules Schick M.D.   On: 09/10/2023 11:38    PROCEDURES and INTERVENTIONS:  .1-3 Lead EKG Interpretation  Performed by: Delton Prairie, MD Authorized by: Delton Prairie, MD     Interpretation: normal     ECG rate:  90   ECG rate assessment: normal     Rhythm: sinus rhythm     Ectopy: none     Conduction: normal     Medications  lidocaine (LIDODERM) 5 % 1 patch (1 patch Transdermal Patch Applied 09/10/23 0948)  sodium zirconium cyclosilicate (LOKELMA) packet 10 g (has no administration in time range)  acetaminophen (TYLENOL) tablet 1,000 mg (1,000 mg Oral Given 09/10/23 0947)  methocarbamol (ROBAXIN) tablet 500 mg (500 mg Oral Given 09/10/23 0947)     IMPRESSION / MDM / ASSESSMENT AND PLAN / ED COURSE  I reviewed  the triage vital signs and the nursing notes.  Differential diagnosis includes, but is not limited to, aortic dissection, ACS, pancreatitis, muscular strain or spasm, rib fracture, pneumothorax  {Patient presents with symptoms of an acute illness or injury that is potentially life-threatening.  Patient presents to the ED with a couple days of atraumatic back discomfort of likely muscular etiology.  Has a generally reassuring workup with a clear CXR, nonischemic EKG and negative troponin.  Mild hyperkalemia is noted for which she received oral Lokelma, renal function at baseline and a normal CBC.  Feeling better, up and walking around the room after management of possible muscular etiology and he is requesting discharge.  Does not want to stay any longer.  We discussed ED return precautions.  Discussed PCP follow-up for potassium recheck.  I considered observation admission for this patient but he is quite adamant he just wants to go home and has capacity.  Clinical Course as of 09/10/23 1158  Tue Sep 10, 2023  1104 Mild hyperkalemia is noted.  Looking back at previous metabolic panels he is often borderline high.  I again reviewed EKG, no changes.  Renal dysfunction at baseline.  Will provide Lokelma orally [DS]  1132 Reassessed.  Feeling better and requesting discharge.  With my assistance he is able to stand up and walk around the room and reports feeling better.  He is nontender to palpation at the area of concern.  We discussed ED return precautions.  He is appreciative [DS]    Clinical Course User Index [DS] Delton Prairie, MD     FINAL CLINICAL IMPRESSION(S) / ED DIAGNOSES   Final diagnoses:  Acute right-sided thoracic back pain  Hyperkalemia     Rx / DC Orders   ED Discharge Orders          Ordered    lidocaine (LIDODERM) 5 %  Every 12 hours        09/10/23 1133    methocarbamol (ROBAXIN) 500 MG tablet  Every 8 hours PRN        09/10/23 1133             Note:  This  document was prepared using Dragon voice recognition software and may include unintentional dictation errors.   Delton Prairie, MD 09/10/23 [phone]

## 2023-09-10 NOTE — ED Triage Notes (Signed)
 Pt comes in from home via [facility] from home with complaints of right flank plain since last night. Pt complains of pain 10/10. Pt has a history of COPD, aneurysm, and is hard of hearing.  Pt is alert and oriented x4, with no signs of acute distress at this time.

## 2023-09-10 NOTE — Discharge Instructions (Addendum)
 Follow-up with your doctor in the next week for a recheck of your pain and to recheck your potassium number and blood work.  This number was somewhat high and needs to be rechecked.  If your symptoms worsen despite the medications prescribed then please return to the ED.  If he has any difficulty breathing, weakness or passing out then please return to the ED.  Use Tylenol for pain and fevers.  Up to 1000 mg per dose, up to 4 times per day.  Do not take more than 4000 mg of Tylenol/acetaminophen within 24 hours..  Please use lidocaine patches at your site of pain.  Apply 1 patch at a time, leave on for 12 hours, then remove for 12 hours.  12 hours on, 12 hours off.  Do not apply more than 1 patch at a time.  Use Robaxin muscle relaxer as needed for more severe/breakthrough pain, up to 3 times per day. This medication can make some people sleepy, so do not use while driving, working or Designer, television/film set

## 2023-09-13 ENCOUNTER — Other Ambulatory Visit: Payer: Self-pay

## 2023-09-13 ENCOUNTER — Emergency Department
Admission: EM | Admit: 2023-09-13 | Discharge: 2023-09-13 | Disposition: A | Discharge status: Home / Self Care | Source: Home / Self Care | Attending: Emergency Medicine | Admitting: Emergency Medicine

## 2023-09-13 ENCOUNTER — Emergency Department

## 2023-09-13 DIAGNOSIS — R339 Retention of urine, unspecified: Secondary | ICD-10-CM | POA: Diagnosis not present

## 2023-09-13 DIAGNOSIS — S22000A Wedge compression fracture of unspecified thoracic vertebra, initial encounter for closed fracture: Secondary | ICD-10-CM

## 2023-09-13 DIAGNOSIS — R110 Nausea: Secondary | ICD-10-CM | POA: Insufficient documentation

## 2023-09-13 DIAGNOSIS — R109 Unspecified abdominal pain: Secondary | ICD-10-CM | POA: Insufficient documentation

## 2023-09-13 DIAGNOSIS — I10 Essential (primary) hypertension: Secondary | ICD-10-CM | POA: Diagnosis not present

## 2023-09-13 DIAGNOSIS — S22060A Wedge compression fracture of T7-T8 vertebra, initial encounter for closed fracture: Secondary | ICD-10-CM | POA: Insufficient documentation

## 2023-09-13 DIAGNOSIS — N281 Cyst of kidney, acquired: Secondary | ICD-10-CM | POA: Diagnosis not present

## 2023-09-13 DIAGNOSIS — N3001 Acute cystitis with hematuria: Secondary | ICD-10-CM | POA: Diagnosis not present

## 2023-09-13 DIAGNOSIS — X58XXXA Exposure to other specified factors, initial encounter: Secondary | ICD-10-CM | POA: Insufficient documentation

## 2023-09-13 DIAGNOSIS — R1031 Right lower quadrant pain: Secondary | ICD-10-CM | POA: Diagnosis not present

## 2023-09-13 DIAGNOSIS — K429 Umbilical hernia without obstruction or gangrene: Secondary | ICD-10-CM | POA: Diagnosis not present

## 2023-09-13 DIAGNOSIS — S22080A Wedge compression fracture of T11-T12 vertebra, initial encounter for closed fracture: Secondary | ICD-10-CM | POA: Diagnosis not present

## 2023-09-13 DIAGNOSIS — K4090 Unilateral inguinal hernia, without obstruction or gangrene, not specified as recurrent: Secondary | ICD-10-CM | POA: Diagnosis not present

## 2023-09-13 DIAGNOSIS — E871 Hypo-osmolality and hyponatremia: Secondary | ICD-10-CM | POA: Diagnosis not present

## 2023-09-13 DIAGNOSIS — K439 Ventral hernia without obstruction or gangrene: Secondary | ICD-10-CM | POA: Diagnosis not present

## 2023-09-13 LAB — CBC
HCT: 37.8 % — ABNORMAL LOW (ref 39.0–52.0)
Hemoglobin: 12.4 g/dL — ABNORMAL LOW (ref 13.0–17.0)
MCH: 30.6 pg (ref 26.0–34.0)
MCHC: 32.8 g/dL (ref 30.0–36.0)
MCV: 93.3 fL (ref 80.0–100.0)
Platelets: 272 10*3/uL (ref 150–400)
RBC: 4.05 MIL/uL — ABNORMAL LOW (ref 4.22–5.81)
RDW: 13 % (ref 11.5–15.5)
WBC: 9.8 10*3/uL (ref 4.0–10.5)
nRBC: 0 % (ref 0.0–0.2)

## 2023-09-13 LAB — LIPASE, BLOOD: Lipase: 24 U/L (ref 11–51)

## 2023-09-13 LAB — COMPREHENSIVE METABOLIC PANEL WITH GFR
ALT: 13 U/L (ref 0–44)
AST: 28 U/L (ref 15–41)
Albumin: 3.6 g/dL (ref 3.5–5.0)
Alkaline Phosphatase: 89 U/L (ref 38–126)
Anion gap: 12 (ref 5–15)
BUN: 35 mg/dL — ABNORMAL HIGH (ref 8–23)
CO2: 22 mmol/L (ref 22–32)
Calcium: 8.7 mg/dL — ABNORMAL LOW (ref 8.9–10.3)
Chloride: 94 mmol/L — ABNORMAL LOW (ref 98–111)
Creatinine, Ser: 1.45 mg/dL — ABNORMAL HIGH (ref 0.61–1.24)
GFR, Estimated: 47 mL/min — ABNORMAL LOW (ref >60)
Glucose, Bld: 174 mg/dL — ABNORMAL HIGH (ref 70–99)
Potassium: 4.4 mmol/L (ref 3.5–5.1)
Sodium: 128 mmol/L — ABNORMAL LOW (ref 135–145)
Total Bilirubin: 0.8 mg/dL (ref 0.0–1.2)
Total Protein: 6.9 g/dL (ref 6.5–8.1)

## 2023-09-13 MED ORDER — TRAMADOL HCL 50 MG PO TABS: 50.0000 mg | ORAL_TABLET | Freq: Four times a day (QID) | ORAL | 0 refills | Status: DC | PRN

## 2023-09-13 MED ORDER — IOHEXOL 350 MG/ML SOLN
75.0000 mL | Freq: Once | INTRAVENOUS | Status: AC | PRN
  Administered 2023-09-13: 75 mL via INTRAVENOUS

## 2023-09-13 MED ORDER — FENTANYL CITRATE PF 50 MCG/ML IJ SOSY
50.0000 ug | PREFILLED_SYRINGE | Freq: Once | INTRAMUSCULAR | Status: AC
  Administered 2023-09-13: 50 ug via INTRAVENOUS
  Filled 2023-09-13: qty 1

## 2023-09-13 NOTE — ED Notes (Signed)
 [facility] labs sent down with save tube labels.

## 2023-09-13 NOTE — ED Notes (Signed)
TLSO  BRACE  CALLED FOR  PER  DR  Archie Balboa  MD

## 2023-09-13 NOTE — ED Notes (Signed)
 X3 attempts to collect Pt urine and stated he cannot urinate at the moment.

## 2023-09-13 NOTE — ED Provider Notes (Signed)
 [facility] Provider Note    Event Date/Time   First MD Initiated Contact with Patient 09/13/23 [phone]     (approximate)   History   Abdominal Pain   HPI  Ruben Green is a 89 y.o. male  who presents to the emergency department today because of concern for continued back pain as well as abdominal pain. The patient was seen in the emergency department 3 days ago for the back pain.  Denies any recent trauma.  Had x-rays done at that time which were negative.  Since then however his pain has gotten worse and he is now having abdominal pain.  Has had some nausea but no vomiting.  Denies any diarrhea or change in urination.  No fevers.  Denies similar symptoms in the past.      Physical Exam   Triage Vital Signs: ED Triage Vitals  Encounter Vitals Group     BP 09/13/23 0908 101/86     Systolic BP Percentile --      Diastolic BP Percentile --      Pulse Rate 09/13/23 0908 (!) 105     Resp 09/13/23 0908 16     Temp 09/13/23 0908 97.6 F (36.4 C)     Temp Source 09/13/23 0908 Oral     SpO2 09/13/23 0910 94 %     Weight --      Height 09/13/23 0945 5\' 2"  (1.575 m)     Head Circumference --      Peak Flow --      Pain Score 09/13/23 0930 9     Pain Loc --      Pain Education --      Exclude from Growth Chart --     Most recent vital signs: Vitals:   09/13/23 0915 09/13/23 0930  BP:  125/82  Pulse:  100  Resp:  19  Temp:    SpO2: 96%    General: Awake, alert, oriented. CV:  Good peripheral perfusion. Regular rate and rhythm. Resp:  Normal effort. Lungs clear. Abd:  No distention. Non tender. Other:  Mild spinal tenderness to mid spine.   ED Results / Procedures / Treatments   Labs (all labs ordered are listed, but only abnormal results are displayed) Labs Reviewed  CBC - Abnormal; Notable for the following components:      Result Value   RBC 4.05 (*)    Hemoglobin 12.4 (*)    HCT 37.8 (*)    All other components within normal  limits  LIPASE, BLOOD  COMPREHENSIVE METABOLIC PANEL WITH GFR  URINALYSIS, ROUTINE W REFLEX MICROSCOPIC     EKG  I, Phineas Semen, attending physician, personally viewed and interpreted this EKG  EKG Time: 0940 Rate: 92 Rhythm: sinus rhythm Axis: normal Intervals: qtc 498 QRS: RBBB, LAFB ST changes: no st elevation Impression: abnormal ekg   RADIOLOGY I independently interpreted and visualized the CT angio dissection chest/abd/pel. My interpretation: No aortic dissection Radiology interpretation:  IMPRESSION:  1. No acute aortic syndrome. No thoracic acute intramural hematoma.  No thoracic or abdominal aortic aneurysm, dissection or penetrating  atherosclerotic ulcer.  2. There is moderate compression deformity of T7 vertebral body, of  indeterminate age, but new since the prior study from 12/25/2021. No  significant retropulsion or spinal canal compromise.  3. There are subacute/healing fractures of anteromedial right fifth  through seventh ribs and left anteromedial sixth through eighth  ribs.  4. Multiple other nonacute observations, as described above.  Aortic Atherosclerosis (ICD10-I70.0) and Emphysema (ICD10-J43.9).      PROCEDURES:  Critical Care performed: No   MEDICATIONS ORDERED IN ED: Medications  fentaNYL (SUBLIMAZE) injection 50 mcg (has no administration in time range)     IMPRESSION / MDM / ASSESSMENT AND PLAN / ED COURSE  I reviewed the triage vital signs and the nursing notes.                              Differential diagnosis includes, but is not limited to, spine fracture, intraabdominal infection.  Patient's presentation is most consistent with acute presentation with potential threat to life or bodily function.   Patient presented to the emergency department today with continued back pain and now some abdominal discomfort.  On exam patient is somewhat tender in the mid back.  Patient has history of aortic aneurysm.  Because of  this CT angio was performed.  While this fortunately did not show any aortic issues it did reveal a T7 compression fracture.  This would correspond with the patient's area of pain.  Otherwise no acute intra-abdominal findings.  Patient was placed in TSL O brace and did feel improvement.  Will plan on discharging with short course of pain medication as well.  Will give patient neurosurgery follow-up for compression fracture.  FINAL CLINICAL IMPRESSION(S) / ED DIAGNOSES   Final diagnoses:  Compression fracture of body of thoracic vertebra [facility])    Note:  This document was prepared using Dragon voice recognition software and may include unintentional dictation errors.    Phineas Semen, MD 09/14/23 [phone]

## 2023-09-13 NOTE — ED Notes (Signed)
 Pt had TSLO brace placed this RN did a musculoskeletal assessment. Pt states he feels comfortable. Extremities assessed, TSLO tech did teach back and Pt understands education. Capillary refill <3 seconds.

## 2023-09-13 NOTE — ED Triage Notes (Signed)
 C/O upper back pain x 1 week.  Seen through ED for same.  Arrives today c/O  lower abd pain and lower back pain since yesterday.  Denies dysuria. Last BM this morning.  Yesterday patient had some nausea.

## 2023-09-13 NOTE — ED Notes (Signed)
 Ortho technician at bedside applying TSLO brace

## 2023-09-13 NOTE — Progress Notes (Signed)
 Orthopedic Tech Progress Note Patient Details:  [name] [birth year] [id number] Called order into [employer] for TLSO Patient ID: Ruben Green, male   DOB: [birth year], 89 y.o.   MRN: [id number]  Lovett Calender 09/13/2023, 1:03 PM

## 2023-09-15 ENCOUNTER — Other Ambulatory Visit
Admission: RE | Admit: 2023-09-15 | Discharge: 2023-09-15 | Disposition: A | Discharge status: Home / Self Care | Source: Ambulatory Visit | Attending: Family Medicine | Admitting: Family Medicine

## 2023-09-15 DIAGNOSIS — I5032 Chronic diastolic (congestive) heart failure: Secondary | ICD-10-CM | POA: Insufficient documentation

## 2023-09-15 DIAGNOSIS — J029 Acute pharyngitis, unspecified: Secondary | ICD-10-CM | POA: Diagnosis not present

## 2023-09-15 DIAGNOSIS — N189 Chronic kidney disease, unspecified: Secondary | ICD-10-CM | POA: Insufficient documentation

## 2023-09-15 LAB — BASIC METABOLIC PANEL WITH GFR
Anion gap: 12 (ref 5–15)
BUN: 45 mg/dL — ABNORMAL HIGH (ref 8–23)
CO2: 21 mmol/L — ABNORMAL LOW (ref 22–32)
Calcium: 8.7 mg/dL — ABNORMAL LOW (ref 8.9–10.3)
Chloride: 87 mmol/L — ABNORMAL LOW (ref 98–111)
Creatinine, Ser: 1.61 mg/dL — ABNORMAL HIGH (ref 0.61–1.24)
GFR, Estimated: 41 mL/min — ABNORMAL LOW (ref >60)
Glucose, Bld: 98 mg/dL (ref 70–99)
Potassium: 4.8 mmol/L (ref 3.5–5.1)
Sodium: 120 mmol/L — ABNORMAL LOW (ref 135–145)

## 2023-09-16 ENCOUNTER — Other Ambulatory Visit: Payer: Self-pay

## 2023-09-16 ENCOUNTER — Emergency Department

## 2023-09-16 ENCOUNTER — Inpatient Hospital Stay
Admission: EM | Admit: 2023-09-16 | Discharge: 2023-09-19 | DRG: 641 | Disposition: A | Discharge status: Home / Self Care | Attending: Internal Medicine | Admitting: Internal Medicine

## 2023-09-16 DIAGNOSIS — Z8551 Personal history of malignant neoplasm of bladder: Secondary | ICD-10-CM

## 2023-09-16 DIAGNOSIS — D500 Iron deficiency anemia secondary to blood loss (chronic): Secondary | ICD-10-CM | POA: Diagnosis present

## 2023-09-16 DIAGNOSIS — I4510 Unspecified right bundle-branch block: Secondary | ICD-10-CM | POA: Diagnosis not present

## 2023-09-16 DIAGNOSIS — R339 Retention of urine, unspecified: Secondary | ICD-10-CM | POA: Diagnosis not present

## 2023-09-16 DIAGNOSIS — K5730 Diverticulosis of large intestine without perforation or abscess without bleeding: Secondary | ICD-10-CM | POA: Diagnosis not present

## 2023-09-16 DIAGNOSIS — Z961 Presence of intraocular lens: Secondary | ICD-10-CM | POA: Diagnosis not present

## 2023-09-16 DIAGNOSIS — N390 Urinary tract infection, site not specified: Secondary | ICD-10-CM | POA: Diagnosis present

## 2023-09-16 DIAGNOSIS — F411 Generalized anxiety disorder: Secondary | ICD-10-CM | POA: Diagnosis not present

## 2023-09-16 DIAGNOSIS — E871 Hypo-osmolality and hyponatremia: Principal | ICD-10-CM | POA: Diagnosis present

## 2023-09-16 DIAGNOSIS — S22061A Stable burst fracture of T7-T8 vertebra, initial encounter for closed fracture: Secondary | ICD-10-CM | POA: Diagnosis not present

## 2023-09-16 DIAGNOSIS — H3530 Unspecified macular degeneration: Secondary | ICD-10-CM | POA: Diagnosis present

## 2023-09-16 DIAGNOSIS — M4854XA Collapsed vertebra, not elsewhere classified, thoracic region, initial encounter for fracture: Secondary | ICD-10-CM | POA: Diagnosis present

## 2023-09-16 DIAGNOSIS — N1831 Chronic kidney disease, stage 3a: Secondary | ICD-10-CM

## 2023-09-16 DIAGNOSIS — Z7982 Long term (current) use of aspirin: Secondary | ICD-10-CM

## 2023-09-16 DIAGNOSIS — Q625 Duplication of ureter: Secondary | ICD-10-CM | POA: Diagnosis not present

## 2023-09-16 DIAGNOSIS — Z6830 Body mass index (BMI) 30.0-30.9, adult: Secondary | ICD-10-CM

## 2023-09-16 DIAGNOSIS — N3001 Acute cystitis with hematuria: Secondary | ICD-10-CM

## 2023-09-16 DIAGNOSIS — E669 Obesity, unspecified: Secondary | ICD-10-CM | POA: Diagnosis not present

## 2023-09-16 DIAGNOSIS — Z9842 Cataract extraction status, left eye: Secondary | ICD-10-CM

## 2023-09-16 DIAGNOSIS — N179 Acute kidney failure, unspecified: Secondary | ICD-10-CM | POA: Diagnosis present

## 2023-09-16 DIAGNOSIS — N401 Enlarged prostate with lower urinary tract symptoms: Secondary | ICD-10-CM | POA: Diagnosis not present

## 2023-09-16 DIAGNOSIS — B952 Enterococcus as the cause of diseases classified elsewhere: Secondary | ICD-10-CM | POA: Diagnosis present

## 2023-09-16 DIAGNOSIS — K439 Ventral hernia without obstruction or gangrene: Secondary | ICD-10-CM | POA: Diagnosis not present

## 2023-09-16 DIAGNOSIS — N138 Other obstructive and reflux uropathy: Secondary | ICD-10-CM | POA: Diagnosis not present

## 2023-09-16 DIAGNOSIS — Z9841 Cataract extraction status, right eye: Secondary | ICD-10-CM

## 2023-09-16 DIAGNOSIS — N1330 Unspecified hydronephrosis: Secondary | ICD-10-CM | POA: Diagnosis not present

## 2023-09-16 DIAGNOSIS — J302 Other seasonal allergic rhinitis: Secondary | ICD-10-CM | POA: Diagnosis present

## 2023-09-16 DIAGNOSIS — E785 Hyperlipidemia, unspecified: Secondary | ICD-10-CM | POA: Diagnosis not present

## 2023-09-16 DIAGNOSIS — I5033 Acute on chronic diastolic (congestive) heart failure: Secondary | ICD-10-CM | POA: Diagnosis present

## 2023-09-16 DIAGNOSIS — N3000 Acute cystitis without hematuria: Secondary | ICD-10-CM | POA: Diagnosis not present

## 2023-09-16 DIAGNOSIS — I130 Hypertensive heart and chronic kidney disease with heart failure and stage 1 through stage 4 chronic kidney disease, or unspecified chronic kidney disease: Secondary | ICD-10-CM | POA: Diagnosis present

## 2023-09-16 DIAGNOSIS — N136 Pyonephrosis: Secondary | ICD-10-CM | POA: Diagnosis present

## 2023-09-16 DIAGNOSIS — Z8616 Personal history of COVID-19: Secondary | ICD-10-CM

## 2023-09-16 DIAGNOSIS — S22060A Wedge compression fracture of T7-T8 vertebra, initial encounter for closed fracture: Secondary | ICD-10-CM | POA: Diagnosis present

## 2023-09-16 DIAGNOSIS — I452 Bifascicular block: Secondary | ICD-10-CM | POA: Diagnosis present

## 2023-09-16 DIAGNOSIS — R197 Diarrhea, unspecified: Secondary | ICD-10-CM

## 2023-09-16 DIAGNOSIS — Z87891 Personal history of nicotine dependence: Secondary | ICD-10-CM

## 2023-09-16 DIAGNOSIS — I2510 Atherosclerotic heart disease of native coronary artery without angina pectoris: Secondary | ICD-10-CM | POA: Diagnosis not present

## 2023-09-16 DIAGNOSIS — R112 Nausea with vomiting, unspecified: Secondary | ICD-10-CM

## 2023-09-16 DIAGNOSIS — N134 Hydroureter: Secondary | ICD-10-CM | POA: Diagnosis not present

## 2023-09-16 DIAGNOSIS — Z8546 Personal history of malignant neoplasm of prostate: Secondary | ICD-10-CM

## 2023-09-16 DIAGNOSIS — J449 Chronic obstructive pulmonary disease, unspecified: Secondary | ICD-10-CM | POA: Diagnosis not present

## 2023-09-16 DIAGNOSIS — R338 Other retention of urine: Secondary | ICD-10-CM | POA: Diagnosis present

## 2023-09-16 DIAGNOSIS — I5032 Chronic diastolic (congestive) heart failure: Secondary | ICD-10-CM

## 2023-09-16 DIAGNOSIS — Z87442 Personal history of urinary calculi: Secondary | ICD-10-CM

## 2023-09-16 DIAGNOSIS — M4804 Spinal stenosis, thoracic region: Secondary | ICD-10-CM | POA: Diagnosis not present

## 2023-09-16 DIAGNOSIS — Z96641 Presence of right artificial hip joint: Secondary | ICD-10-CM | POA: Diagnosis not present

## 2023-09-16 DIAGNOSIS — Z79899 Other long term (current) drug therapy: Secondary | ICD-10-CM

## 2023-09-16 DIAGNOSIS — Z7984 Long term (current) use of oral hypoglycemic drugs: Secondary | ICD-10-CM

## 2023-09-16 DIAGNOSIS — Z885 Allergy status to narcotic agent status: Secondary | ICD-10-CM

## 2023-09-16 HISTORY — DX: Nausea with vomiting, unspecified: R11.2

## 2023-09-16 HISTORY — DX: Nausea with vomiting, unspecified: R19.7

## 2023-09-16 LAB — URINALYSIS, ROUTINE W REFLEX MICROSCOPIC
Bilirubin Urine: NEGATIVE
Glucose, UA: NEGATIVE mg/dL
Ketones, ur: NEGATIVE mg/dL
Nitrite: NEGATIVE
Protein, ur: NEGATIVE mg/dL
Specific Gravity, Urine: 1.011 (ref 1.005–1.030)
Squamous Epithelial / HPF: 0 /HPF (ref 0–5)
WBC, UA: >50 WBC/hpf (ref 0–5)
pH: 5 (ref 5.0–8.0)

## 2023-09-16 LAB — CBC
HCT: 38 % — ABNORMAL LOW (ref 39.0–52.0)
Hemoglobin: 12.9 g/dL — ABNORMAL LOW (ref 13.0–17.0)
MCH: 30.9 pg (ref 26.0–34.0)
MCHC: 33.9 g/dL (ref 30.0–36.0)
MCV: 91.1 fL (ref 80.0–100.0)
Platelets: 301 10*3/uL (ref 150–400)
RBC: 4.17 MIL/uL — ABNORMAL LOW (ref 4.22–5.81)
RDW: 12.7 % (ref 11.5–15.5)
WBC: 11.7 10*3/uL — ABNORMAL HIGH (ref 4.0–10.5)
nRBC: 0 % (ref 0.0–0.2)

## 2023-09-16 LAB — BASIC METABOLIC PANEL WITH GFR
Anion gap: 13 (ref 5–15)
Anion gap: 13 (ref 5–15)
BUN: 48 mg/dL — ABNORMAL HIGH (ref 8–23)
BUN: 46 mg/dL — ABNORMAL HIGH (ref 8–23)
CO2: 22 mmol/L (ref 22–32)
CO2: 24 mmol/L (ref 22–32)
Calcium: 8.7 mg/dL — ABNORMAL LOW (ref 8.9–10.3)
Calcium: 8.6 mg/dL — ABNORMAL LOW (ref 8.9–10.3)
Chloride: 85 mmol/L — ABNORMAL LOW (ref 98–111)
Chloride: 87 mmol/L — ABNORMAL LOW (ref 98–111)
Creatinine, Ser: 1.72 mg/dL — ABNORMAL HIGH (ref 0.61–1.24)
Creatinine, Ser: 1.7 mg/dL — ABNORMAL HIGH (ref 0.61–1.24)
GFR, Estimated: 38 mL/min — ABNORMAL LOW (ref >60)
GFR, Estimated: 39 mL/min — ABNORMAL LOW (ref >60)
Glucose, Bld: 72 mg/dL (ref 70–99)
Glucose, Bld: 175 mg/dL — ABNORMAL HIGH (ref 70–99)
Potassium: 4.3 mmol/L (ref 3.5–5.1)
Potassium: 3.9 mmol/L (ref 3.5–5.1)
Sodium: 120 mmol/L — ABNORMAL LOW (ref 135–145)
Sodium: 124 mmol/L — ABNORMAL LOW (ref 135–145)

## 2023-09-16 LAB — BRAIN NATRIURETIC PEPTIDE: B Natriuretic Peptide: 1224.5 pg/mL — ABNORMAL HIGH (ref 0.0–100.0)

## 2023-09-16 LAB — OSMOLALITY: Osmolality: 270 mosm/kg — ABNORMAL LOW (ref 275–295)

## 2023-09-16 LAB — PHOSPHORUS: Phosphorus: 4 mg/dL (ref 2.5–4.6)

## 2023-09-16 LAB — SODIUM, URINE, RANDOM: Sodium, Ur: 53 mmol/L

## 2023-09-16 LAB — OSMOLALITY, URINE: Osmolality, Ur: 200 mosm/kg — ABNORMAL LOW (ref 300–900)

## 2023-09-16 LAB — MAGNESIUM: Magnesium: 2 mg/dL (ref 1.7–2.4)

## 2023-09-16 MED ORDER — TRAMADOL HCL 50 MG PO TABS
50.0000 mg | ORAL_TABLET | Freq: Four times a day (QID) | ORAL | Status: DC | PRN
  Administered 2023-09-17 (×2): 50 mg via ORAL
  Filled 2023-09-16 (×2): qty 1

## 2023-09-16 MED ORDER — FLUTICASONE FUROATE-VILANTEROL 100-25 MCG/ACT IN AEPB
1.0000 | INHALATION_SPRAY | Freq: Every day | RESPIRATORY_TRACT | Status: DC
  Administered 2023-09-18 – 2023-09-19 (×2): 1 via RESPIRATORY_TRACT
  Filled 2023-09-16: qty 28

## 2023-09-16 MED ORDER — ASPIRIN 81 MG PO TBEC
81.0000 mg | DELAYED_RELEASE_TABLET | Freq: Every day | ORAL | Status: DC
  Administered 2023-09-17 – 2023-09-18 (×2): 81 mg via ORAL
  Filled 2023-09-16 (×2): qty 1

## 2023-09-16 MED ORDER — TAMSULOSIN HCL 0.4 MG PO CAPS
0.4000 mg | ORAL_CAPSULE | Freq: Every day | ORAL | Status: DC
  Administered 2023-09-17 – 2023-09-19 (×3): 0.4 mg via ORAL
  Filled 2023-09-16 (×3): qty 1

## 2023-09-16 MED ORDER — ONDANSETRON HCL 4 MG/2ML IJ SOLN: 4.0000 mg | Freq: Three times a day (TID) | INTRAMUSCULAR | Status: DC | PRN

## 2023-09-16 MED ORDER — ONDANSETRON HCL 4 MG/2ML IJ SOLN
4.0000 mg | Freq: Once | INTRAMUSCULAR | Status: AC
  Administered 2023-09-16: 4 mg via INTRAVENOUS
  Filled 2023-09-16: qty 2

## 2023-09-16 MED ORDER — MORPHINE SULFATE (PF) 4 MG/ML IV SOLN
4.0000 mg | Freq: Once | INTRAVENOUS | Status: AC
  Administered 2023-09-16: 4 mg via INTRAVENOUS
  Filled 2023-09-16: qty 1

## 2023-09-16 MED ORDER — DM-GUAIFENESIN ER 30-600 MG PO TB12: 1.0000 | ORAL_TABLET | Freq: Two times a day (BID) | ORAL | Status: DC | PRN

## 2023-09-16 MED ORDER — PANTOPRAZOLE SODIUM 40 MG PO TBEC
40.0000 mg | DELAYED_RELEASE_TABLET | Freq: Every day | ORAL | Status: DC
  Administered 2023-09-17 – 2023-09-19 (×3): 40 mg via ORAL
  Filled 2023-09-16 (×3): qty 1

## 2023-09-16 MED ORDER — ATORVASTATIN CALCIUM 20 MG PO TABS
40.0000 mg | ORAL_TABLET | Freq: Every day | ORAL | Status: DC
  Administered 2023-09-17 – 2023-09-19 (×3): 40 mg via ORAL
  Filled 2023-09-16 (×3): qty 2

## 2023-09-16 MED ORDER — HYDRALAZINE HCL 20 MG/ML IJ SOLN: 5.0000 mg | INTRAMUSCULAR | Status: DC | PRN

## 2023-09-16 MED ORDER — LORAZEPAM 0.5 MG PO TABS
0.5000 mg | ORAL_TABLET | Freq: Every day | ORAL | Status: DC | PRN
  Administered 2023-09-17: 0.5 mg via ORAL
  Filled 2023-09-16: qty 1

## 2023-09-16 MED ORDER — POLYSACCHARIDE IRON COMPLEX 150 MG PO CAPS
150.0000 mg | ORAL_CAPSULE | Freq: Two times a day (BID) | ORAL | Status: DC
  Administered 2023-09-16 – 2023-09-19 (×6): 150 mg via ORAL
  Filled 2023-09-16 (×7): qty 1

## 2023-09-16 MED ORDER — SODIUM CHLORIDE 1 G PO TABS
1.0000 g | ORAL_TABLET | Freq: Two times a day (BID) | ORAL | Status: DC
  Administered 2023-09-16 – 2023-09-19 (×6): 1 g via ORAL
  Filled 2023-09-16 (×6): qty 1

## 2023-09-16 MED ORDER — ACETAMINOPHEN 325 MG PO TABS
650.0000 mg | ORAL_TABLET | Freq: Four times a day (QID) | ORAL | Status: DC | PRN
  Administered 2023-09-16 – 2023-09-19 (×3): 650 mg via ORAL
  Filled 2023-09-16 (×3): qty 2

## 2023-09-16 MED ORDER — ENOXAPARIN SODIUM 30 MG/0.3ML IJ SOSY
30.0000 mg | PREFILLED_SYRINGE | INTRAMUSCULAR | Status: DC
  Administered 2023-09-16 – 2023-09-18 (×3): 30 mg via SUBCUTANEOUS
  Filled 2023-09-16 (×3): qty 0.3

## 2023-09-16 MED ORDER — LIDOCAINE 5 % EX PTCH
1.0000 | MEDICATED_PATCH | CUTANEOUS | Status: DC
  Administered 2023-09-16 – 2023-09-18 (×3): 1 via TRANSDERMAL
  Filled 2023-09-16 (×4): qty 1

## 2023-09-16 MED ORDER — SODIUM CHLORIDE 0.9 % IV BOLUS
500.0000 mL | Freq: Once | INTRAVENOUS | Status: AC
  Administered 2023-09-16: 500 mL via INTRAVENOUS

## 2023-09-16 MED ORDER — SODIUM CHLORIDE 0.9 % IV SOLN
2.0000 g | Freq: Once | INTRAVENOUS | Status: AC
  Administered 2023-09-16: 2 g via INTRAVENOUS
  Filled 2023-09-16: qty 20

## 2023-09-16 MED ORDER — METHOCARBAMOL 500 MG PO TABS: 500.0000 mg | ORAL_TABLET | Freq: Three times a day (TID) | ORAL | Status: DC | PRN

## 2023-09-16 MED ORDER — ALBUTEROL SULFATE (2.5 MG/3ML) 0.083% IN NEBU
3.0000 mL | INHALATION_SOLUTION | RESPIRATORY_TRACT | Status: DC | PRN
  Administered 2023-09-19: 3 mL via RESPIRATORY_TRACT
  Filled 2023-09-16: qty 3

## 2023-09-16 MED ORDER — IOHEXOL 300 MG/ML  SOLN
100.0000 mL | Freq: Once | INTRAMUSCULAR | Status: AC | PRN
  Administered 2023-09-16: 100 mL via INTRAVENOUS

## 2023-09-16 MED ORDER — SODIUM CHLORIDE 0.9 % IV SOLN
2.0000 g | INTRAVENOUS | Status: DC
  Administered 2023-09-17: 2 g via INTRAVENOUS
  Filled 2023-09-16 (×2): qty 20

## 2023-09-16 MED ORDER — UMECLIDINIUM BROMIDE 62.5 MCG/ACT IN AEPB
1.0000 | INHALATION_SPRAY | Freq: Every day | RESPIRATORY_TRACT | Status: DC
  Administered 2023-09-18 – 2023-09-19 (×2): 1 via RESPIRATORY_TRACT
  Filled 2023-09-16: qty 7

## 2023-09-16 MED ORDER — METOPROLOL SUCCINATE ER 50 MG PO TB24
50.0000 mg | ORAL_TABLET | Freq: Every day | ORAL | Status: DC
  Administered 2023-09-17 – 2023-09-19 (×3): 50 mg via ORAL
  Filled 2023-09-16 (×3): qty 1

## 2023-09-16 NOTE — ED Notes (Incomplete)
 Family member states

## 2023-09-16 NOTE — ED Notes (Addendum)
 Pt ambulatory to the bathroom at this time. Urine sample collected.

## 2023-09-16 NOTE — ED Notes (Signed)
 Bladder Scan: 795 mL

## 2023-09-16 NOTE — ED Provider Notes (Signed)
 [facility] Provider Note    Event Date/Time   First MD Initiated Contact with Patient 09/16/23 1502     (approximate)   History   Flank Pain   HPI Ruben Green is a 89 y.o. male with history of HTN, HFpEF, CAD, COPD presenting today for abnormal labs.  Patient has been seen in the ED recently for low back pain and was eventually diagnosed with compression fracture.  He has not been feeling well over the past couple of days with nausea, vomiting, and questionable diarrhea per wife.  He was seen at [facility] clinic yesterday and told to come to the ED after he was found to have a sodium level of 120.  They do admit to decreased p.o. intake.  Otherwise taking all his other medications as prescribed.  No additional over-the-counter medications.  No significant excessive water use.  He also notes pain on the right flank which is new for him today.  Wife reports prior history of kidney stones 40 years ago.     Physical Exam   Triage Vital Signs: ED Triage Vitals  Encounter Vitals Group     BP 09/16/23 1113 125/79     Systolic BP Percentile --      Diastolic BP Percentile --      Pulse Rate 09/16/23 1113 (!) 53     Resp 09/16/23 1113 18     Temp 09/16/23 1113 98 F (36.7 C)     Temp Source 09/16/23 1400 Oral     SpO2 09/16/23 1113 92 %     Weight 09/16/23 1111 168 lb (76.2 kg)     Height 09/16/23 1111 5\' 2"  (1.575 m)     Head Circumference --      Peak Flow --      Pain Score 09/16/23 1111 6     Pain Loc --      Pain Education --      Exclude from Growth Chart --     Most recent vital signs: Vitals:   09/16/23 1400 09/16/23 1806  BP: (!) 141/83 126/72  Pulse: (!) 56 (!) 116  Resp: 16 18  Temp: 98 F (36.7 C) 98 F (36.7 C)  SpO2: 95% 90%   Physical Exam: I have reviewed the vital signs and nursing notes. General: Awake, alert, no acute distress.  Nontoxic appearing. Head:  Atraumatic, normocephalic.   ENT:  EOM intact, PERRL. Oral  mucosa is pink and moist with no lesions. Neck: Neck is supple with full range of motion, No meningeal signs. Cardiovascular:  RRR, No murmurs. Peripheral pulses palpable and equal bilaterally. Respiratory:  Symmetrical chest wall expansion.  No rhonchi, rales, or wheezes.  Good air movement throughout.  No use of accessory muscles.   Musculoskeletal:  No cyanosis.  1+ pitting edema to bilateral lower extremities.  Moving extremities with full ROM Abdomen:  Soft, nontender, nondistended.  Slight right-sided CVA tenderness to palpation Neuro:  GCS 15, moving all four extremities, interacting appropriately. Speech clear. Psych:  Calm, appropriate.   Skin:  Warm, dry, no rash.    ED Results / Procedures / Treatments   Labs (all labs ordered are listed, but only abnormal results are displayed) Labs Reviewed  URINALYSIS, ROUTINE W REFLEX MICROSCOPIC - Abnormal; Notable for the following components:      Result Value   Color, Urine YELLOW (*)    APPearance TURBID (*)    Hgb urine dipstick SMALL (*)    Leukocytes,Ua LARGE (*)  Bacteria, UA FEW (*)    All other components within normal limits  BASIC METABOLIC PANEL WITH GFR - Abnormal; Notable for the following components:   Sodium 120 (*)    Chloride 85 (*)    BUN 48 (*)    Creatinine, Ser 1.72 (*)    Calcium 8.7 (*)    GFR, Estimated 38 (*)    All other components within normal limits  CBC - Abnormal; Notable for the following components:   WBC 11.7 (*)    RBC 4.17 (*)    Hemoglobin 12.9 (*)    HCT 38.0 (*)    All other components within normal limits  BRAIN NATRIURETIC PEPTIDE - Abnormal; Notable for the following components:   B Natriuretic Peptide 1,224.5 (*)    All other components within normal limits     EKG    RADIOLOGY Independently interpreted CT imaging with right sided hydronephrosis which is new from most recent CT in the past week.  Also has urinary distention as well.  No other acute intra-abdominal  infections   PROCEDURES:  Critical Care performed: Yes, see critical care procedure note(s)  .Critical Care  Performed by: Janith Lima, MD Authorized by: Janith Lima, MD   Critical care provider statement:    Critical care time (minutes):  30   Critical care was necessary to treat or prevent imminent or life-threatening deterioration of the following conditions: Profound hyponatremia.   Critical care was time spent personally by me on the following activities:  Development of treatment plan with patient or surrogate, discussions with consultants, evaluation of patient's response to treatment, examination of patient, ordering and review of laboratory studies, ordering and review of radiographic studies, ordering and performing treatments and interventions, pulse oximetry, re-evaluation of patient's condition and review of old charts   I assumed direction of critical care for this patient from another provider in my specialty: no     Care discussed with: admitting provider      MEDICATIONS ORDERED IN ED: Medications  cefTRIAXone (ROCEPHIN) 2 g in sodium chloride 0.9 % 100 mL IVPB (has no administration in time range)  ondansetron (ZOFRAN) injection 4 mg (4 mg Intravenous Given 09/16/23 1601)  morphine (PF) 4 MG/ML injection 4 mg (4 mg Intravenous Given 09/16/23 1602)  iohexol (OMNIPAQUE) 300 MG/ML solution 100 mL (100 mLs Intravenous Contrast Given 09/16/23 1620)  sodium chloride 0.9 % bolus 500 mL (500 mLs Intravenous New Bag/Given 09/16/23 1636)     IMPRESSION / MDM / ASSESSMENT AND PLAN / ED COURSE  I reviewed the triage vital signs and the nursing notes.                              Differential diagnosis includes, but is not limited to, diminished p.o. intake inducing hyponatremia, polydipsia, medication side effect, nephrolithiasis, cholecystitis, appendicitis  Patient's presentation is most consistent with acute complicated illness / injury requiring diagnostic  workup.  Patient is an 89 year old male presenting today for abnormal labs.  Was found to have a sodium level of 120.  Reports decreased p.o. intake associated with some nausea, vomiting, and diarrhea over the past couple of days.  I suspect likely fluid losses the source of his hyponatremia.  Also complaining of new right flank pain which is different than prior mid back pain.  Will get repeat CT to evaluate for possible kidney stone with prior history of this.  CT imaging shows no acute  intra-abdominal infections but does show urinary distention and right sided hydronephrosis which is new from most recent scan.  Patient had greater than 800 cc in his bladder and after voiding continued to still have greater than 800 cc.  Does have prior history of prostate cancer.  Will place Foley catheter to drain the bladder.  UA shows signs of infection.  His hyponatremia may be multifactorial both to decreased p.o. intake, continued torsemide use, and acute urinary obstruction.  Will start on ceftriaxone and admit to hospitalist for further care.  The patient is on the cardiac monitor to evaluate for evidence of arrhythmia and/or significant heart rate changes. Clinical Course as of 09/16/23 1829  Mon Sep 16, 2023  1751 Patient with greater than 800 cc of postvoid residual on bladder scan.  Will place Foley catheter at this time [DW]    Clinical Course User Index [DW] Janith Lima, MD     FINAL CLINICAL IMPRESSION(S) / ED DIAGNOSES   Final diagnoses:  Hyponatremia  Acute urinary retention  Acute cystitis with hematuria     Rx / DC Orders   ED Discharge Orders     None        Note:  This document was prepared using Dragon voice recognition software and may include unintentional dictation errors.   Janith Lima, MD 09/16/23 [phone]

## 2023-09-16 NOTE — H&P (Signed)
 History and Physical    Ruben Green ZOX:[id number] DOB: 07-17-1933 DOA: 09/16/2023  Referring MD/NP/PA:   PCP: Barbette Reichmann, MD   Patient coming from:  The patient is coming from home.     Chief Complaint: Dysuria, difficulty urinating, poor appetite, back pain,   HPI: Ruben Green is a 89 y.o. male with medical history significant of HTN, HDL, COPD, CAD, dCHf, anxiety, BPH, CKD 3A, bladder cancer (s/p of BCG treatment), hard of hearing, right bundle blockade, kidney stone, prostate cancer, AAA (s/p of repair), recent fall with T7 compression fracture, who presents with dysuria, difficulty urinating, poor appetite, back pain.  Per patient and his daughter-in-law at the bedside, patient has difficulty urinating and dysuria with burning on urination intermittently in the past several days.  No hematuria.  Recently patient had fall with T7 compression fracture by x-ray on 3/28/205.  He continues to have back pain, which is constant, aching, moderate, nonradiating.  Patient does not have chest pain, cough, SOB.  No fever or chills.  Patient has poor appetite and decreased oral intake.  He denies vomiting, diarrhea or abdominal pain to me.  Patient was found to have acute urinary retention with residual volume 865 in ED.  Foley catheter is placed in ED.  Data reviewed independently and ED Course: pt was found to have BNP 1224, WBC 11.7, sodium 120, positive UA (turbid appearance, large amount of leukocyte, few bacteria, WBC > 50), worsening renal function with creatinine 1.72, BUN 48 and GFR 38 (recent baseline creatinine 1.45 on 09/13/2023).  Temperature normal, blood pressure 126/72, heart rate 53, 116, RR 18, oxygen saturation 90-95% on room air.  Patient is admitted to telemetry bed as inpatient.   CT of abdomen/pelvis: 1. Interval development of mild right hydronephrosis and hydroureter to the level of distal ureter at the junction of the duplicated ureters. No obstructing  stone. 2. Severe sigmoid diverticulosis. No bowel obstruction. Normal appendix. 3. Midline ventral supraumbilical hernia containing a short segment of the transverse colon. No bowel obstruction. 4. Moderate size hiatal hernia containing a portion of the stomach.     EKG: I have personally reviewed.  Patient had EKG on 3/28 which showed sinus rhythm, bifascicular block, PVC, early R wave progression.  Will repeat EKG today.    Review of Systems:   General: no fevers, chills, no body weight gain, has poor appetite, has fatigue HEENT: no blurry vision, hearing changes or sore throat Respiratory: no dyspnea, coughing, wheezing CV: no chest pain, no palpitations GI: no nausea, vomiting, abdominal pain, diarrhea, constipation GU: Has dysuria with burning on urination, difficulty urinating, no hematuria Ext: has leg edema Neuro: no unilateral weakness, numbness, or tingling, no vision change or hearing loss Skin: no rash, no skin tear. MSK: No muscle spasm, no deformity, no limitation of range of movement in spin Heme: No easy bruising.  Travel history: No recent long distant travel.   Allergy:  Allergies  Allergen Reactions   Oxycodone Other (See Comments)    Caused hallucinations/does not want anymore    Past Medical History:  Diagnosis Date   AAA (abdominal aortic aneurysm) (HCC)    a.) s/p repair in 2005   Anemia    Anginal pain (HCC)    Anxiety    Arthritis    B12 deficiency    Bilateral cataracts    a.) s/p BILATERAL extractions in 2018   BPH with obstruction/lower urinary tract symptoms    CAD (coronary artery disease)  Carotid artery stenosis    a.) s/p CEA on the RIGHT   CHF (congestive heart failure) (HCC)    COPD (chronic obstructive pulmonary disease) (HCC)    Diastolic dysfunction 07/29/2019   a.)  TTE 07/29/2019: EF 50-55%; LA mildly enlarged; G1DD.   DOE (dyspnea on exertion)    Elevated PSA    Environmental and seasonal allergies    History of 2019  novel coronavirus disease (COVID-19)    History of kidney stones    HLD (hyperlipidemia)    HOH (hard of hearing)    HTN (hypertension)    Incomplete bladder emptying    Macular degeneration    Nausea vomiting and diarrhea 09/16/2023   Prostate cancer (HCC)    RBBB (right bundle branch block)    Valvular insufficiency    a.) TTE 07/29/2019: LVEF 50-55%; LA mild enlarged; triv AR/PR, mild MR, mod TR.    Past Surgical History:  Procedure Laterality Date   ABDOMINAL AORTIC ANEURYSM REPAIR     CATARACT EXTRACTION W/PHACO Right 09/25/2016   Procedure: CATARACT EXTRACTION PHACO AND INTRAOCULAR LENS PLACEMENT (IOC);  Surgeon: Galen Manila, MD;  Location: [facility] ORS;  Service: Ophthalmology;  Laterality: Right;  [place] 01:01 AP% 17.4 CDE 10.75 Fluid pack lot # [id number]   CATARACT EXTRACTION W/PHACO Left 10/16/2016   Procedure: CATARACT EXTRACTION PHACO AND INTRAOCULAR LENS PLACEMENT (IOC) Suture placed in Left eye;  Surgeon: Galen Manila, MD;  Location: [facility];  Service: Ophthalmology;  Laterality: Left;  [place] 2:06.8 AP% 22.2 CDE 28.17 Fluid pack lot # [id number]   COLONOSCOPY WITH PROPOFOL N/A 03/31/2019   Procedure: COLONOSCOPY WITH PROPOFOL;  Surgeon: Christena Deem, MD;  Location: [facility] END[facility]COPY;  Service: Endoscopy;  Laterality: N/A;   COLONOSCOPY WITH PROPOFOL N/A 09/09/2020   Procedure: COLONOSCOPY WITH PROPOFOL;  Surgeon: Regis Bill, MD;  Location: [facility] ENDOSCOPY;  Service: Endoscopy;  Laterality: N/A;   CYSTOSCOPY W/ RETROGRADES Bilateral 02/23/2020   Procedure: CYSTOSCOPY WITH RETROGRADE PYELOGRAM;  Surgeon: Riki Altes, MD;  Location: [facility];  Service: Urology;  Laterality: Bilateral;   CYSTOSCOPY WITH BIOPSY N/A 02/23/2020   Procedure: CYSTOSCOPY WITH BIOPSY;  Surgeon: Riki Altes, MD;  Location: [facility] OR[facility];  Service: Urology;  Laterality: N/A;   EYE SURGERY     HERNIA REPAIR     x 2   INGUINAL HERNIA REPAIR Right 12/02/2015   Procedure: HERNIA REPAIR INGUINAL  ADULT;  Surgeon: Nadeen Landau, MD;  Location: [facility] OR[facility];  Service: General;  Laterality: Right;   PROSTATE SURGERY  2012   Right Carotid Endarterectomy     TOTAL HIP ARTHROPLASTY Right 08/01/2021   Procedure: TOTAL HIP ARTHROPLASTY;  Surgeon: Christena Flake, MD;  Location: [facility] ORS;  Service: Orthopedics;  Laterality: Right;    Social History:  reports that he quit smoking about 24 years ago. His smoking use included cigarettes. He has quit using smokeless tobacco. He reports that he does not currently use alcohol. He reports that he does not use drugs.  Family History:  Family History  Problem Relation Age of Onset   Cancer Brother        2000   Prostate cancer Neg Hx    Bladder Cancer Neg Hx    Kidney cancer Neg Hx      Prior to Admission medications   Medication Sig Start Date End Date Taking? Authorizing Provider  acetaminophen (TYLENOL) 500 MG tablet Take 1-2 tablets (500-1,000 mg total) by mouth every 6 (six) hours as needed for  mild pain. 08/04/21   Anson Oregon, PA-C  albuterol (VENTOLIN HFA) 108 (90 Base) MCG/ACT inhaler Inhale 2 puffs into the lungs every 4 (four) hours as needed. 03/24/23   Dionne Bucy, MD  aspirin EC 81 MG tablet Take 1 tablet (81 mg total) by mouth daily. Swallow whole. 12/29/21   Charise Killian, MD  atorvastatin (LIPITOR) 40 MG tablet Take 40 mg by mouth daily. 01/02/23   [provider]  empagliflozin (JARDIANCE) 10 MG TABS tablet Take 1 tablet (10 mg total) by mouth daily before breakfast. 07/11/23   Delma Freeze, FNP  Fluticasone-Umeclidin-Vilant (TRELEGY ELLIPTA) 100-62.5-25 MCG/INH AEPB Inhale 2 puffs into the lungs daily. 09/01/19   [provider]  iron polysaccharides (NIFEREX) 150 MG capsule Take 150 mg by mouth 2 (two) times daily. 10/23/21   [provider]  lidocaine (LIDODERM) 5 % Place 1 patch onto the skin every 12 (twelve) hours. Remove & Discard patch within 12 hours or as directed by MD 09/10/23  09/09/24  Delton Prairie, MD  LORazepam (ATIVAN) 0.5 MG tablet Take 0.5 mg by mouth daily as needed for anxiety. 10/23/21   [provider]  methocarbamol (ROBAXIN) 500 MG tablet Take 1 tablet (500 mg total) by mouth every 8 (eight) hours as needed. 09/10/23   Delton Prairie, MD  metoprolol succinate (TOPROL-XL) 50 MG 24 hr tablet Take 1 tablet (50 mg total) by mouth daily. Take with or immediately following a meal. 12/29/21   Charise Killian, MD  tamsulosin (FLOMAX) 0.4 MG CAPS capsule Take 0.4 mg by mouth daily. 10/04/22   [provider]  torsemide (DEMADEX) 20 MG tablet Take 1 tablet (20 mg total) by mouth daily. 05/06/23   Delma Freeze, FNP  traMADol (ULTRAM) 50 MG tablet Take 1 tablet (50 mg total) by mouth every 6 (six) hours as needed. 09/13/23 09/12/24  Phineas Semen, MD    Physical Exam: Vitals:   09/16/23 1806 09/16/23 2215 09/17/23 0000 09/17/23 0157  BP: 126/72 110/68 115/68   Pulse: (!) 116 94 91   Resp: 18 17 14    Temp: 98 F (36.7 C) 98 F (36.7 C)  98 F (36.7 C)  TempSrc: Axillary Axillary  Oral  SpO2: 90% 97% 97%   Weight:      Height:       General: Not in acute distress HEENT:       Eyes: PERRL, EOMI, no jaundice       ENT: No discharge from the ears and nose, no pharynx injection, no tonsillar enlargement.        Neck: No JVD, no bruit, no mass felt. Heme: No neck lymph node enlargement. Cardiac: S1/S2, RRR, No murmurs, No gallops or rubs. Respiratory: No rales, wheezing, rhonchi or rubs. GI: Soft, nondistended, nontender, no rebound pain, no organomegaly, BS present. GU: No hematuria Ext: 1+ pitting leg edema bilaterally. 1+DP/PT pulse bilaterally. Musculoskeletal: No joint deformities, No joint redness or warmth, no limitation of ROM in spin. Skin: No rashes.  Neuro: Alert, oriented X3, cranial nerves II-XII grossly intact, moves all extremities normally.  Psych: Patient is not psychotic, no suicidal or hemocidal ideation.  Labs on  Admission: I have personally reviewed following labs and imaging studies  CBC: Recent Labs  Lab 09/10/23 0951 09/13/23 0927 09/16/23 1113  WBC 7.3 9.8 11.7*  NEUTROABS 5.8  --   --   HGB 11.6* 12.4* 12.9*  HCT 36.5* 37.8* 38.0*  MCV 95.3 93.3 91.1  PLT 213  272 301   Basic Metabolic Panel: Recent Labs  Lab 09/10/23 0951 09/13/23 0927 09/15/23 1540 09/16/23 1113 09/16/23 2032  NA 136 128* 120* 120* 124*  K 5.4* 4.4 4.8 4.3 3.9  CL 104 94* 87* 85* 87*  CO2 23 22 21* 22 24  GLUCOSE 125* 174* 98 72 175*  BUN 43* 35* 45* 48* 46*  CREATININE 1.59* 1.45* 1.61* 1.72* 1.70*  CALCIUM 9.0 8.7* 8.7* 8.7* 8.6*  MG  --   --   --  2.0  --   PHOS  --   --   --  4.0  --    GFR: Estimated Creatinine Clearance: 27.4 mL/min (A) (by C-G formula based on SCr of 1.7 mg/dL (H)). Liver Function Tests: Recent Labs  Lab 09/10/23 0951 09/13/23 0927  AST 20 28  ALT 12 13  ALKPHOS 84 89  BILITOT 0.6 0.8  PROT 6.7 6.9  ALBUMIN 3.6 3.6   Recent Labs  Lab 09/10/23 0951 09/13/23 0927  LIPASE 31 24   No results for input(s): "AMMONIA" in the last 168 hours. Coagulation Profile: No results for input(s): "INR", "PROTIME" in the last 168 hours. Cardiac Enzymes: No results for input(s): "CKTOTAL", "CKMB", "CKMBINDEX", "TROPONINI" in the last 168 hours. BNP (last 3 results) No results for input(s): "PROBNP" in the last 8760 hours. HbA1C: No results for input(s): "HGBA1C" in the last 72 hours. CBG: No results for input(s): "GLUCAP" in the last 168 hours. Lipid Profile: No results for input(s): "CHOL", "HDL", "LDLCALC", "TRIG", "CHOLHDL", "LDLDIRECT" in the last 72 hours. Thyroid Function Tests: No results for input(s): "TSH", "T4TOTAL", "FREET4", "T3FREE", "THYROIDAB" in the last 72 hours. Anemia Panel: No results for input(s): "VITAMINB12", "FOLATE", "FERRITIN", "TIBC", "IRON", "RETICCTPCT" in the last 72 hours. Urine analysis:    Component Value Date/Time   COLORURINE YELLOW (A)  09/16/2023 1543   APPEARANCEUR TURBID (A) 09/16/2023 1543   APPEARANCEUR Clear 09/04/2023 0812   LABSPEC 1.011 09/16/2023 1543   PHURINE 5.0 09/16/2023 1543   GLUCOSEU NEGATIVE 09/16/2023 1543   HGBUR SMALL (A) 09/16/2023 1543   BILIRUBINUR NEGATIVE 09/16/2023 1543   BILIRUBINUR Negative 09/04/2023 0812   KETONESUR NEGATIVE 09/16/2023 1543   PROTEINUR NEGATIVE 09/16/2023 1543   NITRITE NEGATIVE 09/16/2023 1543   LEUKOCYTESUR LARGE (A) 09/16/2023 1543   Sepsis Labs: @LABRCNTIP (procalcitonin:4,lacticidven:4) )No results found for this or any previous visit (from the past 240 hours).   Radiological Exams on Admission:   Assessment/Plan Principal Problem:   Hyponatremia Active Problems:   UTI (urinary tract infection)   Benign prostatic hyperplasia with urinary obstruction   Acute urinary retention   Chronic diastolic CHF (congestive heart failure) (HCC)   Acute renal failure superimposed on stage 3a chronic kidney disease (HCC)   HLD (hyperlipidemia)   CAD (coronary artery disease)   COPD (chronic obstructive pulmonary disease) (HCC)   Compression fracture of T7 vertebra (HCC)   Iron deficiency anemia due to chronic blood loss   Anxiety, generalized   Obesity (BMI 30-39.9)   Assessment and Plan:  Hyponatremia: sodium 120 --> 124 after giving 500 cc of NS In ED.  - Will admit to tele bed as inpt - Will check urine sodium, urine osmolality, serum osmolality. - Fluid restriction - IVF: 500 mL NS in ED - Sodium chloride tablet 1 g twice daily - f/u by BMP q8h - avoid over correction too fast due to risk of central pontine myelinolysis  UTI (urinary tract infection) -IV Rocephin -Follow-up urine culture  Benign prostatic  hyperplasia with urinary obstruction -Flomax  Acute urinary retention -S/p Foley catheter placement  Chronic diastolic CHF (congestive heart failure) (HCC): 2D echo on 12/24/2021 showed EF of 50-55% with grade 1 diastolic dysfunction.  BNP is  elevated 2024, but patient does not have SOB or oxygen desaturation, does not seem to have acute CHF exacerbation.  Patient is obviously at risk of developing CHF exacerbation. -Give 40 mg of Lasix now  Acute renal failure superimposed on stage 3a chronic kidney disease (HCC): Due to combination of UTI and urinary retention -Foley catheter is placed at -Follow-up by BMP  HLD (hyperlipidemia) -Lipitor  CAD (coronary artery disease) -Aspirin, Lipitor  COPD (chronic obstructive pulmonary disease) (HCC) -Bronchodilators and as needed Mucinex  Compression fracture of T7 vertebra (HCC) -As needed tramadol -As needed Robaxin -Lidoderm -As needed Tylenol  Iron deficiency anemia due to chronic blood loss: Hemoglobin stable 12.9. -Continue iron supplement  Anxiety, generalized -Continue as needed Ativan  Obesity (BMI 30-39.9): Body weight 76.2 kg, BMI 30.73 -Encourage losing weight -Exercise and healthy diet      DVT ppx: SQ Lovenox  Code Status: Full code    Family Communication: Yes, patient's daughter-in-law at bed side.    Disposition Plan:  Anticipate discharge back to previous environment  Consults called:  none  Admission status and Level of care: Telemetry Cardiac: as inpt        Dispo: The patient is from: Home              Anticipated d/c is to: Home              Anticipated d/c date is: 2 days              Patient currently is not medically stable to d/c.    Severity of Illness:  The appropriate patient status for this patient is INPATIENT. Inpatient status is judged to be reasonable and necessary in order to provide the required intensity of service to ensure the patient's safety. The patient's presenting symptoms, physical exam findings, and initial radiographic and laboratory data in the context of their chronic comorbidities is felt to place them at high risk for further clinical deterioration. Furthermore, it is not anticipated that the patient will be  medically stable for discharge from the hospital within 2 midnights of admission.   * I certify that at the point of admission it is my clinical judgment that the patient will require inpatient hospital care spanning beyond 2 midnights from the point of admission due to high intensity of service, high risk for further deterioration and high frequency of surveillance required.*       Date of Service 09/17/2023    Lorretta Harp [facility]   If 7PM-7AM, please contact night-coverage [email or web address] 09/17/2023, 2:11 AM

## 2023-09-16 NOTE — ED Notes (Signed)
 Pt to CT at this time.

## 2023-09-16 NOTE — Progress Notes (Signed)
 PHARMACIST - PHYSICIAN COMMUNICATION  CONCERNING:  Enoxaparin (Lovenox) for DVT Prophylaxis    RECOMMENDATION: Patient was prescribed enoxaparin 40mg  q24 hours for VTE prophylaxis.   Filed Weights   09/16/23 1111  Weight: 76.2 kg (168 lb)    Body mass index is 30.73 kg/m.  Estimated Creatinine Clearance: 27 mL/min (A) (by C-G formula based on SCr of 1.72 mg/dL (H)).   Patient is candidate for enoxaparin 30mg  every 24 hours based on CrCl <59ml/min or Weight <45kg  DESCRIPTION: Pharmacy has adjusted enoxaparin dose per [facility] policy.  Patient is now receiving enoxaparin 30 mg every 24 hours   Tressie Ellis 09/16/2023 6:59 PM

## 2023-09-16 NOTE — ED Notes (Addendum)
 Pts family member to front desk- angry about wait time- "what is taking so long, we have been here for hours and many people have came and gone since we have been here". Explained triage and bedding process to family. Family and Patient still angry- "I just dont understand". Apologized for wait.

## 2023-09-16 NOTE — ED Notes (Signed)
 Family Member at bedside states that pt wears Oxygen 3L at home as needed. Pt sats 90%. O2 2L placed at this time. O2 sat 97%

## 2023-09-16 NOTE — ED Notes (Signed)
Bladder scan 865 mL

## 2023-09-16 NOTE — ED Triage Notes (Signed)
 Pt comes with right lower flank pain. Pt states this started few days ago. Pt also has compression fracture. Pt went to pcp and called today about abnormal labs and that pt needed to go to ED.  Family reports Na was 120

## 2023-09-16 NOTE — ED Notes (Signed)
 Lorretta Harp, MD at bedside

## 2023-09-17 ENCOUNTER — Inpatient Hospital Stay

## 2023-09-17 DIAGNOSIS — E871 Hypo-osmolality and hyponatremia: Secondary | ICD-10-CM | POA: Diagnosis not present

## 2023-09-17 LAB — CBC
HCT: 35.1 % — ABNORMAL LOW (ref 39.0–52.0)
Hemoglobin: 11.9 g/dL — ABNORMAL LOW (ref 13.0–17.0)
MCH: 30.9 pg (ref 26.0–34.0)
MCHC: 33.9 g/dL (ref 30.0–36.0)
MCV: 91.2 fL (ref 80.0–100.0)
Platelets: 238 10*3/uL (ref 150–400)
RBC: 3.85 MIL/uL — ABNORMAL LOW (ref 4.22–5.81)
RDW: 12.7 % (ref 11.5–15.5)
WBC: 9 10*3/uL (ref 4.0–10.5)
nRBC: 0 % (ref 0.0–0.2)

## 2023-09-17 LAB — BASIC METABOLIC PANEL WITH GFR
Anion gap: 12 (ref 5–15)
BUN: 43 mg/dL — ABNORMAL HIGH (ref 8–23)
CO2: 22 mmol/L (ref 22–32)
Calcium: 8.5 mg/dL — ABNORMAL LOW (ref 8.9–10.3)
Chloride: 91 mmol/L — ABNORMAL LOW (ref 98–111)
Creatinine, Ser: 1.6 mg/dL — ABNORMAL HIGH (ref 0.61–1.24)
GFR, Estimated: 41 mL/min — ABNORMAL LOW (ref >60)
Glucose, Bld: 115 mg/dL — ABNORMAL HIGH (ref 70–99)
Potassium: 3.6 mmol/L (ref 3.5–5.1)
Sodium: 125 mmol/L — ABNORMAL LOW (ref 135–145)

## 2023-09-17 LAB — GLUCOSE, CAPILLARY: Glucose-Capillary: 146 mg/dL — ABNORMAL HIGH (ref 70–99)

## 2023-09-17 MED ORDER — FUROSEMIDE 10 MG/ML IJ SOLN
40.0000 mg | Freq: Once | INTRAMUSCULAR | Status: AC
  Administered 2023-09-17: 40 mg via INTRAVENOUS
  Filled 2023-09-17: qty 4

## 2023-09-17 MED ORDER — TRAMADOL HCL 50 MG PO TABS
50.0000 mg | ORAL_TABLET | ORAL | Status: DC | PRN
  Administered 2023-09-17: 50 mg via ORAL
  Filled 2023-09-17: qty 1

## 2023-09-17 MED ORDER — CHLORHEXIDINE GLUCONATE CLOTH 2 % EX PADS
6.0000 | MEDICATED_PAD | Freq: Every day | CUTANEOUS | Status: DC
  Administered 2023-09-18 – 2023-09-19 (×3): 6 via TOPICAL

## 2023-09-17 MED ORDER — GADOBUTROL 1 MMOL/ML IV SOLN
8.0000 mL | Freq: Once | INTRAVENOUS | Status: AC | PRN
  Administered 2023-09-17: 8 mL via INTRAVENOUS

## 2023-09-17 NOTE — Progress Notes (Addendum)
 [facility]  - [place] at [facility]   PATIENT NAME: Ruben Green    MR#:  [id number]  DATE OF BIRTH:  03-16-1934  SUBJECTIVE:  wife at bedside. Patient is very hard on hearing. Wife gives most of the history. Apparently came in few days ago after fall developed compression T7 fracture went home patient is noncompliant to wear the brace. Came in after he was called by PCP for sodium 120. Wife tells me she is not even urinary incontinence and catheter was placed. Bladder scan showed about 800 mL of urine.    VITALS:  Blood pressure (!) 100/59, pulse 90, temperature 97.6 F (36.4 C), temperature source Oral, resp. rate (!) 26, height 5\' 2"  (1.575 m), weight 76.2 kg, SpO2 93%.  PHYSICAL EXAMINATION:   GENERAL:  89 y.o.-year-old patient with no acute distress. Weak deconditioned LUNGS: Normal breath sounds bilaterally, no wheezing CARDIOVASCULAR: S1, S2 normal. No murmur   ABDOMEN: Soft, nontender, nondistended. FOLEY+  EXTREMITIES: No  edema b/l.    NEUROLOGIC: nonfocal  patient is alert and awake-- limited due to back pain   LABORATORY PANEL:  CBC Recent Labs  Lab 09/17/23 0248  WBC 9.0  HGB 11.9*  HCT 35.1*  PLT 238    Chemistries  Recent Labs  Lab 09/13/23 0927 09/15/23 1540 09/16/23 1113 09/16/23 2032 09/17/23 0248  NA 128*   < > 120*   < > 125*  K 4.4   < > 4.3   < > 3.6  CL 94*   < > 85*   < > 91*  CO2 22   < > 22   < > 22  GLUCOSE 174*   < > 72   < > 115*  BUN 35*   < > 48*   < > 43*  CREATININE 1.45*   < > 1.72*   < > 1.60*  CALCIUM 8.7*   < > 8.7*   < > 8.5*  MG  --   --  2.0  --   --   AST 28  --   --   --   --   ALT 13  --   --   --   --   ALKPHOS 89  --   --   --   --   BILITOT 0.8  --   --   --   --    < > = values in this interval not displayed.   Cardiac Enzymes No results for input(s): "TROPONINI" in the last 168 hours. RADIOLOGY:  CT ABDOMEN PELVIS W CONTRAST Result Date: 09/16/2023 CLINICAL DATA:  Right flank pain.   Nausea and vomiting. EXAM: CT ABDOMEN AND PELVIS WITH CONTRAST TECHNIQUE: Multidetector CT imaging of the abdomen and pelvis was performed using the standard protocol following bolus administration of intravenous contrast. RADIATION DOSE REDUCTION: This exam was performed according to the departmental dose-optimization program which includes automated exposure control, adjustment of the mA and/or kV according to patient size and/or use of iterative reconstruction technique. CONTRAST:  OMNIPAQUE IOHEXOL 300 MG/ML  SOLN COMPARISON:  CT dated 09/13/2023. FINDINGS: Lower chest: Bibasilar atelectasis/scarring. No intra-abdominal free air or free fluid. Hepatobiliary: The liver is unremarkable. No biliary dilatation. The gallbladder is unremarkable Pancreas: Unremarkable. No pancreatic ductal dilatation or surrounding inflammatory changes. Spleen: Normal in size without focal abnormality. Adrenals/Urinary Tract: The adrenal glands are unremarkable. Mild bilateral renal parenchyma atrophy. Small bilateral renal cysts. 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 There is interval development of mild right hydronephrosis and hydroureter to the level of distal ureter were the duplicated ureters join. No obstructing stone. No hydronephrosis on the left. The left ureter is unremarkable. The urinary bladder is distended. There is a small left Hutch diverticula. Stomach/Bowel: Severe sigmoid diverticulosis. There is midline ventral supraumbilical hernia containing a short segment of the transverse colon. The neck of the hernia defect measures approximately 4.5 cm in transverse axial diameter. There is no bowel obstruction or active inflammation. There is a moderate size hiatal hernia containing a portion of the stomach. The appendix is normal. Vascular/Lymphatic: Advanced atherosclerotic calcification of the abdominal aorta. An aortobifemoral bypass graft noted which appears patent. The  IVC is unremarkable. No portal venous gas. There is no adenopathy. Reproductive: The prostate and seminal vesicles are grossly unremarkable. Other: Small fat containing left inguinal hernia as well as a fat containing umbilical hernia. The neck of the fat containing umbilical hernia is approximately 5 cm in transverse axial diameter. Musculoskeletal: Total right hip arthroplasty. Osteopenia with degenerative changes spine. No acute osseous pathology. IMPRESSION: 1. Interval development of mild right hydronephrosis and hydroureter to the level of distal ureter at the junction of the duplicated ureters. No obstructing stone. 2. Severe sigmoid diverticulosis. No bowel obstruction. Normal appendix. 3. Midline ventral supraumbilical hernia containing a short segment of the transverse colon. No bowel obstruction. 4. Moderate size hiatal hernia containing a portion of the stomach. Electronically Signed   By: Elgie Collard M.D.   On: 09/16/2023 16:55    Assessment and Plan   Ruben Green is a 89 y.o. male with medical history significant of HTN, HDL, COPD, CAD, dCHf, anxiety, BPH, CKD 3A, bladder cancer (s/p of BCG treatment), hard of hearing, right bundle blockade, kidney stone, prostate cancer, AAA (s/p of repair), recent fall with T7 compression fracture, who presents with dysuria, difficulty urinating, poor appetite, back pain.   Recently patient had fall with T7 compression fracture by x-ray on 3/28/205.  He continues to have back pain, which is constant, aching, moderate, nonradiating.   Patient has poor appetite and decreased oral intake. He denies vomiting, diarrhea or abdominal pain to me. Patient was found to have acute urinary retention with residual volume 865 in ED. Foley catheter is placed in ED.   CT of abdomen/pelvis: 1. Interval development of mild right hydronephrosis and hydroureter to the level of distal ureter at the junction of the duplicated ureters. No obstructing stone. 2.  Severe sigmoid diverticulosis. No bowel obstruction. Normal appendix. 3. Midline ventral supraumbilical hernia containing a short segment of the transverse colon. No bowel obstruction. 4. Moderate size hiatal hernia containing a portion of the stomach.  Hyponatremia: sodium 120 --> 124 --125 -- Fluid restriction - IVF: 500 mL NS in ED - Sodium chloride tablet 1 g twice daily -daily metabolic panel. Consider nephrology consultation if needed   UTI (urinary tract infection) -IV Rocephin -Follow-up urine culture   Benign prostatic hyperplasia with urinary obstruction -Flomax   Acute urinary retention -S/p Foley catheter placement -- either attempt to remove Foley catheter or consider voiding trial as outpatient with urology.   Chronic diastolic CHF (congestive heart failure) (HCC): 2D echo on 12/24/2021 showed EF of 50-55% with grade 1 diastolic dysfunction.   --BNP is elevated 1200, but patient does not have SOB or oxygen desaturation, does not seem to have acute CHF exacerbation.  Patient is obviously at risk of developing CHF exacerbation. -40 mg of Lasixx1 --pt  takes torsemide at home--if bp stable resume it after reviewing labs in am   Acute renal failure superimposed on stage 3a chronic kidney disease (HCC): Due to combination of UTI and urinary retention -Foley catheter is placed  -creat trending down   HLD (hyperlipidemia) -Lipitor   CAD (coronary artery disease) -Aspirin, Lipitor   COPD (chronic obstructive pulmonary disease) (HCC) -Bronchodilators and as needed Mucinex   Compression fracture of T7 vertebra (HCC) -As needed tramadol -Lidoderm -As needed Tylenol -- will have IR see pt to see if he is candidate for kyphoplasty and then f/u neurosurgery as out pt   Iron deficiency anemia due to chronic blood loss: Hemoglobin stable 12.9. -Continue iron supplement   Anxiety, generalized -Continue as needed Ativan   Obesity (BMI 30-39.9): Body weight 76.2 kg, BMI  30.73 -Encourage losing weight -Exercise and healthy diet         Procedures: Family communication : wife at bedside Consults : neuro- surgery CODE STATUS: full DVT Prophylaxis : Lovenox Level of care: Telemetry Cardiac Status is: Inpatient Remains inpatient appropriate because: hyponatremia, back pain    TOTAL TIME TAKING CARE OF THIS PATIENT: 50 minutes.  >50% time spent on counselling and coordination of care  Note: This dictation was prepared with Dragon dictation along with smaller phrase technology. Any transcriptional errors that result from this process are unintentional.  Enedina Finner M.D    [facility]   CC: Primary care physician; Barbette Reichmann, MD

## 2023-09-17 NOTE — TOC Initial Note (Signed)
 Transition of Care [facility]) - Initial/Assessment Note    Patient Details  Name: Ruben Green MRN: [id number] Date of Birth: 1933/07/05  [facility] CM/SW Contact:    Colin Broach, LCSW Phone Number: 09/17/2023, 12:40 PM  Clinical Narrative:                  This is a CHF patient without PT/OT consult.  Patient last admitted June 2024.  PCP is Barbette Reichmann, MD.  Previously used [place] for Home Health services.  No other TOC needs at this time.  CSW signing off.        Patient Goals and CMS Choice            Expected Discharge Plan and Services                                              Prior Living Arrangements/Services                       Activities of Daily Living      Permission Sought/Granted                  Emotional Assessment              Admission diagnosis:  UTI (urinary tract infection) [N39.0] Hyponatremia [E87.1] Patient Active Problem List   Diagnosis Date Noted   UTI (urinary tract infection) 09/16/2023   Acute urinary retention 09/16/2023   Obesity (BMI 30-39.9) 09/16/2023   HLD (hyperlipidemia) 09/16/2023   CAD (coronary artery disease) 09/16/2023   Compression fracture of T7 vertebra (HCC) 09/16/2023   Small bowel obstruction (HCC) 12/06/2022   BPH (benign prostatic hyperplasia) 12/06/2022   History of AAA (abdominal aortic aneurysm) repair 12/06/2022   Leukocytosis 12/06/2022   Ventral hernia 12/06/2022   Anemia due to chronic kidney disease 02/09/2022   Iron deficiency anemia due to chronic blood loss 02/08/2022   Chronic kidney disease 02/08/2022   Sepsis (HCC) 12/23/2021   Chronic diastolic CHF (congestive heart failure) (HCC) 12/23/2021   Respiratory tract infection 12/23/2021   NSTEMI (non-ST elevated myocardial infarction) (HCC) 12/23/2021   Acute renal failure superimposed on stage 3a chronic kidney disease (HCC) 08/08/2021   Hyponatremia 08/08/2021   Urinary tract  infection due to Proteus 08/08/2021   Anemia 08/08/2021   Bilateral pneumonia 08/08/2021   Severe sepsis (HCC) 08/08/2021   Acute respiratory failure with hypoxia (HCC) 08/08/2021   Bilateral pleural effusion 08/08/2021   Elevated troponin 08/08/2021   On continuous oral anticoagulation 08/08/2021   Status post right hip replacement 08/01/21 08/04/2021   Status post total hip replacement, right 08/01/2021   Carcinoma in situ of bladder 03/05/2020   Prostate cancer (HCC) 07/03/2017   Endophthalmitis, acute, right 02/23/2017   Coronary artery disease involving native coronary artery of native heart without angina pectoris 05/30/2016   Benign fibroma of prostate 11/08/2015   COPD (chronic obstructive pulmonary disease) (HCC) 11/08/2015   Essential (primary) hypertension 11/08/2015   Combined fat and carbohydrate induced hyperlipemia 11/08/2015   Low serum cobalamin 04/05/2015   Elevated PSA 02/08/2015   BPH with obstruction/lower urinary tract symptoms 02/08/2015   Erectile dysfunction of organic origin 02/08/2015   Anxiety 08/24/2014   Abdominal aortic aneurysm (AAA) without rupture 05/20/2014   Carotid artery plaque 05/20/2014   Breathlessness on exertion 05/20/2014  Aneurysm of abdominal vessel (HCC) 05/20/2014   Anxiety, generalized 04/20/2014   Benign prostatic hyperplasia with urinary obstruction 10/27/2012   PCP:  Barbette Reichmann, MD Pharmacy:   [facility] - [place], [place] - [address] [place] [address] Phone: [phone] Fax: [phone]  [id number] [place], [place] - [address] AT [facility] [address] [place] [address] Phone: [phone] Fax: [phone]     Social Drivers of Health (SDOH) Social History: SDOH Screenings   Food Insecurity: No Food Insecurity (07/22/2023)   Received from [facility]  Housing: Low Risk  (07/22/2023)    Received from [facility]  Transportation Needs: No Transportation Needs (07/22/2023)   Received from [facility]  Utilities: Not At Risk (07/22/2023)   Received from [facility]  Financial Resource Strain: Low Risk  (07/22/2023)   Received from [facility]  Physical Activity: Unknown (04/23/2017)   Received from [facility], [facility]  Social Connections: Unknown (04/23/2017)   Received from [facility], [facility]  Stress: Unknown (04/23/2017)   Received from [facility], [facility]  Tobacco Use: Medium Risk (09/16/2023)   SDOH Interventions:     Readmission Risk Interventions    09/17/2023   12:40 PM 12/07/2022   10:47 AM  Readmission Risk Prevention Plan  PCP or Specialist Appt within 3-5 Days  Complete  HRI or Home Care Consult  Complete  Social Work Consult for Recovery Care Planning/Counseling  Complete  Palliative Care Screening  Not Applicable  SW Recovery Care/Counseling Consult Complete   Palliative Care Screening Not Applicable   Skilled Nursing Facility Not Applicable

## 2023-09-17 NOTE — Progress Notes (Signed)
 PT Cancellation Note  Patient Details Name: Ruben Green MRN: [id number] DOB: [birth year]   Cancelled Treatment:    Reason Eval/Treat Not Completed: Patient at procedure or test/unavailable.  Chart reviewed and attempted to see pt.  Pt being transferred to different floor from ED and not available.  No room listed on chart at this time.  Pt to be seen at later date/time as medically appropriate.   Nolon Bussing, PT, DPT Physical Therapist - [facility]  09/17/23, 4:04 PM

## 2023-09-18 ENCOUNTER — Encounter: Payer: Self-pay | Admitting: Internal Medicine

## 2023-09-18 DIAGNOSIS — E871 Hypo-osmolality and hyponatremia: Secondary | ICD-10-CM | POA: Diagnosis not present

## 2023-09-18 LAB — URINE CULTURE: Culture: >=100000 — AB

## 2023-09-18 LAB — BASIC METABOLIC PANEL WITH GFR
Anion gap: 11 (ref 5–15)
BUN: 49 mg/dL — ABNORMAL HIGH (ref 8–23)
CO2: 24 mmol/L (ref 22–32)
Calcium: 7.9 mg/dL — ABNORMAL LOW (ref 8.9–10.3)
Chloride: 89 mmol/L — ABNORMAL LOW (ref 98–111)
Creatinine, Ser: 1.87 mg/dL — ABNORMAL HIGH (ref 0.61–1.24)
GFR, Estimated: 34 mL/min — ABNORMAL LOW (ref >60)
Glucose, Bld: 122 mg/dL — ABNORMAL HIGH (ref 70–99)
Potassium: 3.8 mmol/L (ref 3.5–5.1)
Sodium: 124 mmol/L — ABNORMAL LOW (ref 135–145)

## 2023-09-18 MED ORDER — HYDROMORPHONE HCL 1 MG/ML IJ SOLN
1.0000 mg | INTRAMUSCULAR | Status: DC | PRN
  Administered 2023-09-18: 1 mg via INTRAVENOUS
  Filled 2023-09-18: qty 1

## 2023-09-18 MED ORDER — SODIUM CHLORIDE 0.9 % IV SOLN
1.0000 g | Freq: Four times a day (QID) | INTRAVENOUS | Status: DC
  Administered 2023-09-18 – 2023-09-19 (×4): 1 g via INTRAVENOUS
  Filled 2023-09-18 (×7): qty 1000

## 2023-09-18 MED ORDER — DOCUSATE SODIUM 100 MG PO CAPS
200.0000 mg | ORAL_CAPSULE | Freq: Two times a day (BID) | ORAL | Status: DC
  Administered 2023-09-18 – 2023-09-19 (×2): 200 mg via ORAL
  Filled 2023-09-18 (×2): qty 2

## 2023-09-18 MED ORDER — POLYETHYLENE GLYCOL 3350 17 G PO PACK
17.0000 g | PACK | Freq: Every day | ORAL | Status: DC
  Administered 2023-09-18 – 2023-09-19 (×2): 17 g via ORAL
  Filled 2023-09-18 (×2): qty 1

## 2023-09-18 NOTE — Evaluation (Signed)
 Occupational Therapy Evaluation Patient Details Name: Ruben Green MRN: [id number] DOB: [birth year] Today's Date: 09/18/2023   History of Present Illness   Pt is an 89 y.o. male with medical history significant of HTN, HDL, COPD, CAD, dCHf, anxiety, BPH, CKD 3A, bladder cancer (s/p of BCG treatment), hard of hearing, right bundle blockade, kidney stone, prostate cancer, AAA (s/p of repair), recent fall with T7 compression fracture, who presents with dysuria, difficulty urinating, poor appetite, back pain.     Clinical Impressions Pt was seen for OT evaluation this date. Prior to hospital admission, pt lives at home with his wife in a 1 level home with 2 STE and HR on the R side. He was IND with ADLs, IADLs and mobility prior to this admission.  Pt presents to acute OT demonstrating impaired ADL performance and functional mobility 2/2 weakness, pain, low activity tolerance and impulsivity. Pt currently requires CGA for STS from recliner to RW, CGA for ~100 feet of mobility on RA using RW. Sp02 drop to 80% requiring 3L to return to 91% quickly. SUP and increased time for LB dressing tasks to take shoes off and slide them back on. Edu pt/wife on back precautions and use of long handled AE/AD to maximize his IND and safety to allow for healing and prevent further pain during ADL management. Pt is slightly impulsive, but still moving around fairly well. Adjusted TLSO for tighter/better fit as it had slid down. Pt left seated EOB with family present. Pt would benefit from skilled OT services to address noted impairments and functional limitations (see below for any additional details) in order to maximize safety and independence while minimizing falls risk and caregiver burden. Do anticipate the need for follow up OT services upon acute hospital DC.      If plan is discharge home, recommend the following:   A little help with walking and/or transfers;Help with stairs or ramp for entrance;A little  help with bathing/dressing/bathroom;Supervision due to cognitive status     Functional Status Assessment   Patient has had a recent decline in their functional status and demonstrates the ability to make significant improvements in function in a reasonable and predictable amount of time.     Equipment Recommendations   None recommended by OT     Recommendations for Other Services         Precautions/Restrictions   Precautions Precautions: Fall Restrictions Weight Bearing Restrictions Per Provider Order: No     Mobility Bed Mobility               General bed mobility comments: in recliner pre/post session    Transfers Overall transfer level: Needs assistance Equipment used: Rolling walker (2 wheels) Transfers: Sit to/from Stand Sit to Stand: Contact guard assist           General transfer comment: CGA for STS from recliner to RW; able to ambulate ~100 feet using RW with CGA/SBA      Balance Overall balance assessment: Needs assistance Sitting-balance support: Feet supported Sitting balance-Leahy Scale: Good     Standing balance support: Bilateral upper extremity supported, Reliant on assistive device for balance Standing balance-Leahy Scale: Fair Standing balance comment: RW use                           ADL either performed or assessed with clinical judgement   ADL Overall ADL's : Needs assistance/impaired  Lower Body Dressing: Supervision/safety;Sitting/lateral leans Lower Body Dressing Details (indicate cue type and reason): figure four/able to slip on shoes and slide back on; edu wife on long handled equipment or having his wife assist                     Vision         Perception         Praxis         Pertinent Vitals/Pain Pain Assessment Pain Assessment: 0-10 Pain Score: 0-No pain Pain Location: no pain at rest and none while up moving seemingly Pain Intervention(s):  Monitored during session, Repositioned     Extremity/Trunk Assessment Upper Extremity Assessment Upper Extremity Assessment: Generalized weakness   Lower Extremity Assessment Lower Extremity Assessment: Generalized weakness       Communication Communication Factors Affecting Communication: Hearing impaired;Non - English speaking, interpreter not available (wife able to interpret)   Cognition Arousal: Alert Behavior During Therapy: WFL for tasks assessed/performed, Impulsive               OT - Cognition Comments: impulsive, mildly confused                 Following commands: Impaired Following commands impaired: Follows one step commands with increased time (cueing/interpretation from wife)     Cueing  General Comments   Cueing Techniques: Verbal cues  adjusted TLSO brace   Exercises Other Exercises Other Exercises: Edu on OT in acute setting. Other Exercises: Edu on back precautions and adjusted TLSO while seated/standing at EOB.   Shoulder Instructions      Home Living Family/patient expects to be discharged to:: Private residence Living Arrangements: Spouse/significant other Available Help at Discharge: Family;Available 24 hours/day Type of Home: House Home Access: Stairs to enter [employer] of Steps: 2 Entrance Stairs-Rails: Right Home Layout: One level     Bathroom Shower/Tub: Producer, television/film/video: Standard     Home Equipment: Shower seat - built Char[employer] fundraiser (2 wheels)          Prior Functioning/Environment Prior Level of Function : Independent/Modified Independent;Driving;History of Falls (last six months)             Mobility Comments: no AD use. IND; 1 fall in Feb at [facility] ADLs Comments: IND    OT Problem List: Impaired balance (sitting and/or standing)   OT Treatment/Interventions: Self-care/ADL training;Therapeutic exercise;Balance training;Patient/family education;Therapeutic  activities;DME and/or AE instruction      OT Goals(Current goals can be found in the care plan section)   Acute Rehab OT Goals Patient Stated Goal: return home OT Goal Formulation: With patient/family Time For Goal Achievement: 10/02/23 Potential to Achieve Goals: Good ADL Goals Pt Will Perform Lower Body Bathing: with supervision;sitting/lateral leans;sit to/from stand Pt Will Perform Lower Body Dressing: with supervision;sit to/from stand;sitting/lateral leans;with adaptive equipment Pt Will Transfer to Toilet: with supervision;ambulating;regular height toilet Pt Will Perform Toileting - Clothing Manipulation and hygiene: with supervision;sitting/lateral leans;sit to/from stand Additional ADL Goal #1: Pt will demo/verbalize implementation of 3/3 back precautions for carryover with ADL performance upon DC home to maximize recovery and manage pain.   OT Frequency:  Min 2X/week    Co-evaluation              AM-PAC OT "6 Clicks" Daily Activity     Outcome Measure Help from another person eating meals?: None Help from another person taking care of personal grooming?: None Help from another person toileting, which includes  using toliet, bedpan, or urinal?: A Little Help from another person bathing (including washing, rinsing, drying)?: A Little Help from another person to put on and taking off regular upper body clothing?: None Help from another person to put on and taking off regular lower body clothing?: A Little 6 Click Score: 21   End of Session Equipment Utilized During Treatment: Rolling walker (2 wheels);Back brace Nurse Communication: Mobility status  Activity Tolerance: Patient tolerated treatment well Patient left: in bed;with call bell/phone within reach;with family/visitor present  OT Visit Diagnosis: Other abnormalities of gait and mobility (R26.89)                Time: 7829-5621 OT Time Calculation (min): 26 min Charges:  OT General Charges $OT Visit: 1  Visit OT Evaluation $OT Eval Moderate Complexity: 1 Mod OT Treatments $Therapeutic Activity: 8-22 mins  Shanzay Hepworth, OTR/L 09/18/23, 4:31 PM  Martise Waddell E Ethelean Colla 09/18/2023, 4:26 PM

## 2023-09-18 NOTE — Progress Notes (Signed)
 PROGRESS NOTE    Ruben Green  ZOX:[id number] DOB: [birth year] DOA: 09/16/2023 PCP: Barbette Reichmann, MD    Assessment & Plan:   Principal Problem:   Hyponatremia Active Problems:   UTI (urinary tract infection)   Benign prostatic hyperplasia with urinary obstruction   Acute urinary retention   Chronic diastolic CHF (congestive heart failure) (HCC)   Acute renal failure superimposed on stage 3a chronic kidney disease (HCC)   HLD (hyperlipidemia)   CAD (coronary artery disease)   COPD (chronic obstructive pulmonary disease) (HCC)   Compression fracture of T7 vertebra (HCC)   Iron deficiency anemia due to chronic blood loss   Anxiety, generalized   Obesity (BMI 30-39.9)  Assessment and Plan: Hyponatremia: labile. Continue on NaCl tabs. Will monitor Na level daily    UTI: urine cx growing enterococcus faecalis. Abxs changed to IV amp   Compression fracture on T7: likely secondary to fall in Feb 2025 at the grocery store. Pt did not seek medical attention at that time. ? kyphoplasty as per request of pt's family as pt did not want to wear brace. Neuro surg consulted    BPH: continue on flomax   Acute urinary retention: s/p foley placed on 09/16/23 in the ER. Will do a voiding trial prior to d/c    Chronic diastolic CHF: echo on 12/24/2021 showed EF of 50-55% with grade 1 diastolic dysfunction. Appears compensated. Monitor I/Os. Pt is no longer taking torsemide at home    AKI on CKDIIIa: likely secondary to UTI and urinary retention. Cr is labile    HLD: continue on statin    Hx of CAD: continue on aspirin, statin    COPD: w/o exacerbation. Bronchodilators prn    IDA: continue on iron supplement    Generalized anxiety: severity unknown. Ativan prn    Obesity: BMI 28.6. Would benefit from weight loss        DVT prophylaxis: lovenox Code Status: full  Family Communication: discussed pt's care w/ pt's son at bedside and answered his questions  Disposition Plan:  depends on PT/OT recs   Level of care: Telemetry Cardiac Status is: Inpatient Remains inpatient appropriate because: severity of illness    Consultants:  IR Neuro surg   Procedures:   Antimicrobials: amp   Subjective: Pt c/o back pain   Objective: Vitals:   09/17/23 1920 09/17/23 2323 09/18/23 0527 09/18/23 0540  BP: (!) 97/51 (!) 118/56 110/67   Pulse: 96 82 83   Resp: 20 18 (!) 22   Temp: 98.4 F (36.9 C) 97.9 F (36.6 C) 98.4 F (36.9 C)   TempSrc: Oral     SpO2: 91% 96% 96%   Weight:    71.1 kg  Height:        Intake/Output Summary (Last 24 hours) at 09/18/2023 0826 Last data filed at 09/18/2023 0520 Gross per 24 hour  Intake 341.43 ml  Output 2400 ml  Net -2058.57 ml   Filed Weights   09/16/23 1111 09/18/23 0540  Weight: 76.2 kg 71.1 kg    Examination:  General exam: Appears calm and comfortable  Respiratory system: Clear to auscultation. Respiratory effort normal. Cardiovascular system: S1 & S2+. No rubs, gallops or clicks.  Gastrointestinal system: Abdomen is nondistended, soft and nontender. Normal bowel sounds heard. Central nervous system: Alert and awake. Moves all extremities Psychiatry: Judgement and insight appears at baseline. Flat mood and affect    Data Reviewed: I have personally reviewed following labs and imaging studies  CBC: Recent Labs  Lab 09/13/23 0927 09/16/23 1113 09/17/23 0248  WBC 9.8 11.7* 9.0  HGB 12.4* 12.9* 11.9*  HCT 37.8* 38.0* 35.1*  MCV 93.3 91.1 91.2  PLT 272 301 238   Basic Metabolic Panel: Recent Labs  Lab 09/15/23 1540 09/16/23 1113 09/16/23 2032 09/17/23 0248 09/18/23 0407  NA 120* 120* 124* 125* 124*  K 4.8 4.3 3.9 3.6 3.8  CL 87* 85* 87* 91* 89*  CO2 21* 22 24 22 24   GLUCOSE 98 72 175* 115* 122*  BUN 45* 48* 46* 43* 49*  CREATININE 1.61* 1.72* 1.70* 1.60* 1.87*  CALCIUM 8.7* 8.7* 8.6* 8.5* 7.9*  MG  --  2.0  --   --   --   PHOS  --  4.0  --   --   --    GFR: Estimated Creatinine  Clearance: 24.1 mL/min (A) (by C-G formula based on SCr of 1.87 mg/dL (H)). Liver Function Tests: Recent Labs  Lab 09/13/23 0927  AST 28  ALT 13  ALKPHOS 89  BILITOT 0.8  PROT 6.9  ALBUMIN 3.6   Recent Labs  Lab 09/13/23 0927  LIPASE 24   No results for input(s): "AMMONIA" in the last 168 hours. Coagulation Profile: No results for input(s): "INR", "PROTIME" in the last 168 hours. Cardiac Enzymes: No results for input(s): "CKTOTAL", "CKMB", "CKMBINDEX", "TROPONINI" in the last 168 hours. BNP (last 3 results) No results for input(s): "PROBNP" in the last 8760 hours. HbA1C: No results for input(s): "HGBA1C" in the last 72 hours. CBG: Recent Labs  Lab 09/17/23 2354  GLUCAP 146*   Lipid Profile: No results for input(s): "CHOL", "HDL", "LDLCALC", "TRIG", "CHOLHDL", "LDLDIRECT" in the last 72 hours. Thyroid Function Tests: No results for input(s): "TSH", "T4TOTAL", "FREET4", "T3FREE", "THYROIDAB" in the last 72 hours. Anemia Panel: No results for input(s): "VITAMINB12", "FOLATE", "FERRITIN", "TIBC", "IRON", "RETICCTPCT" in the last 72 hours. Sepsis Labs: No results for input(s): "PROCALCITON", "LATICACIDVEN" in the last 168 hours.  Recent Results (from the past 240 hours)  Urine Culture (for pregnant, neutropenic or urologic patients or patients with an indwelling urinary catheter)     Status: Abnormal (Preliminary result)   Collection Time: 09/16/23  3:43 PM   Specimen: Urine, Clean Catch  Result Value Ref Range Status   Specimen Description   Final    URINE, CLEAN CATCH Performed at [facility], [address] [place], [place] [address]    Special Requests   Final    NONE Performed at [facility], [address], [place], [place] [address]    Culture (A)  Final    >=100,000 COLONIES/mL GRAM POSITIVE COCCI IDENTIFICATION AND SUSCEPTIBILITIES TO FOLLOW Performed at [facility], [address] [place], [place] [address]    Report  Status PENDING  Incomplete         Radiology Studies: MR THORACIC SPINE W WO CONTRAST Result Date: 09/18/2023 CLINICAL DATA:  Mid back pain with compression fracture EXAM: MRI THORACIC WITHOUT AND WITH CONTRAST TECHNIQUE: Multiplanar and multiecho pulse sequences of the thoracic spine were obtained without and with intravenous contrast. CONTRAST:  8mL GADAVIST GADOBUTROL 1 MMOL/ML IV SOLN COMPARISON:  CTA chest 09/13/2023 FINDINGS: Alignment:  Physiologic. Vertebrae: T7 burst fracture with approximately 50% stenosis and 3 mm retropulsion. Moderate bone marrow edema. No other osseous abnormality. Cord:  Normal signal and morphology. Paraspinal and other soft tissues: Negative. Disc levels: No spinal canal stenosis. IMPRESSION: Acute T7 burst fracture with approximately 50% stenosis and 3 mm retropulsion.  Electronically Signed   By: Deatra Robinson M.D.   On: 09/18/2023 00:25   CT ABDOMEN PELVIS W CONTRAST Result Date: 09/16/2023 CLINICAL DATA:  Right flank pain.  Nausea and vomiting. EXAM: CT ABDOMEN AND PELVIS WITH CONTRAST TECHNIQUE: Multidetector CT imaging of the abdomen and pelvis was performed using the standard protocol following bolus administration of intravenous contrast. RADIATION DOSE REDUCTION: This exam was performed according to the departmental dose-optimization program which includes automated exposure control, adjustment of the mA and/or kV according to patient size and/or use of iterative reconstruction technique. CONTRAST:  OMNIPAQUE IOHEXOL 300 MG/ML  SOLN COMPARISON:  CT dated 09/13/2023. FINDINGS: Lower chest: Bibasilar atelectasis/scarring. No intra-abdominal free air or free fluid. Hepatobiliary: The liver is unremarkable. No biliary dilatation. The gallbladder is unremarkable Pancreas: Unremarkable. No pancreatic ductal dilatation or surrounding inflammatory changes. Spleen: Normal in size without focal abnormality. Adrenals/Urinary Tract: The adrenal glands are unremarkable.  Mild bilateral renal parenchyma atrophy. Small bilateral renal cysts. 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 There is interval development of mild right hydronephrosis and hydroureter to the level of distal ureter were the duplicated ureters join. No obstructing stone. No hydronephrosis on the left. The left ureter is unremarkable. The urinary bladder is distended. There is a small left Hutch diverticula. Stomach/Bowel: Severe sigmoid diverticulosis. There is midline ventral supraumbilical hernia containing a short segment of the transverse colon. The neck of the hernia defect measures approximately 4.5 cm in transverse axial diameter. There is no bowel obstruction or active inflammation. There is a moderate size hiatal hernia containing a portion of the stomach. The appendix is normal. Vascular/Lymphatic: Advanced atherosclerotic calcification of the abdominal aorta. An aortobifemoral bypass graft noted which appears patent. The IVC is unremarkable. No portal venous gas. There is no adenopathy. Reproductive: The prostate and seminal vesicles are grossly unremarkable. Other: Small fat containing left inguinal hernia as well as a fat containing umbilical hernia. The neck of the fat containing umbilical hernia is approximately 5 cm in transverse axial diameter. Musculoskeletal: Total right hip arthroplasty. Osteopenia with degenerative changes spine. No acute osseous pathology. IMPRESSION: 1. Interval development of mild right hydronephrosis and hydroureter to the level of distal ureter at the junction of the duplicated ureters. No obstructing stone. 2. Severe sigmoid diverticulosis. No bowel obstruction. Normal appendix. 3. Midline ventral supraumbilical hernia containing a short segment of the transverse colon. No bowel obstruction. 4. Moderate size hiatal hernia containing a portion of the stomach. Electronically Signed   By: Elgie Collard M.D.   On: 09/16/2023 16:55         Scheduled Meds:  aspirin EC  81 mg Oral Daily   atorvastatin  40 mg Oral Daily   Chlorhexidine Gluconate Cloth  6 each Topical Daily   enoxaparin (LOVENOX) injection  30 mg Subcutaneous Q24H   fluticasone furoate-vilanterol  1 puff Inhalation Daily   And   umeclidinium bromide  1 puff Inhalation Daily   iron polysaccharides  150 mg Oral BID   lidocaine  1 patch Transdermal Q24H   metoprolol succinate  50 mg Oral Daily   pantoprazole  40 mg Oral Daily   sodium chloride  1 g Oral BID WC   tamsulosin  0.4 mg Oral Daily   Continuous Infusions:  cefTRIAXone (ROCEPHIN)  IV 2 g (09/17/23 1752)     LOS: 2 days      Charise Killian, MD [facility] Pager [phone]  If 7PM-7AM, please contact night-coverage  [email or web address]  09/18/2023, 8:26 AM

## 2023-09-18 NOTE — Consult Note (Signed)
 Neurosurgery-New Consultation Evaluation 09/18/2023 Sir Ruben Green [id number]  Identifying Statement: Ruben Green is a 89 y.o. male from [place] [address] with thoracic fracture  Physician Requesting Consultation: Enedina Finner, MD  History of Present Illness: Ruben Green is here for evaluation of a thoracic spine fracture that was diagnosed a few days ago.  He does report a major fall over a month ago but started having more back pain recently.  He is having some pain that radiate around the chest.  He denies any numbness or weakness in his lower extremities.  He was given a brace to wear but unfortunately this was not very comfortable and he has been not wearing it.  Currently, he is experiencing pain in the back.  Past Medical History:  Past Medical History:  Diagnosis Date   AAA (abdominal aortic aneurysm) (HCC)    a.) s/p repair in 2005   Anemia    Anginal pain (HCC)    Anxiety    Arthritis    B12 deficiency    Bilateral cataracts    a.) s/p BILATERAL extractions in 2018   BPH with obstruction/lower urinary tract symptoms    CAD (coronary artery disease)    Carotid artery stenosis    a.) s/p CEA on the RIGHT   CHF (congestive heart failure) (HCC)    COPD (chronic obstructive pulmonary disease) (HCC)    Diastolic dysfunction 07/29/2019   a.)  TTE 07/29/2019: EF 50-55%; LA mildly enlarged; G1DD.   DOE (dyspnea on exertion)    Elevated PSA    Environmental and seasonal allergies    History of 2019 novel coronavirus disease (COVID-19)    History of kidney stones    HLD (hyperlipidemia)    HOH (hard of hearing)    HTN (hypertension)    Incomplete bladder emptying    Macular degeneration    Nausea vomiting and diarrhea 09/16/2023   Prostate cancer (HCC)    RBBB (right bundle branch block)    Valvular insufficiency    a.) TTE 07/29/2019: LVEF 50-55%; LA mild enlarged; triv AR/PR, mild MR, mod TR.    Social History: Social History   Socioeconomic  History   Marital status: Married    Spouse name: Not on file   Number of children: Not on file   Years of education: Not on file   Highest education level: Not on file  Occupational History   Not on file  Tobacco Use   Smoking status: Former    Current packs/day: 0.00    Types: Cigarettes    Quit date: 11/30/1998    Years since quitting: 24.8   Smokeless tobacco: Former   Tobacco comments:    quit 10 years ago  Vaping Use   Vaping status: Never Used  Substance and Sexual Activity   Alcohol use: Not Currently    Comment: occasional   Drug use: No   Sexual activity: Not on file  Other Topics Concern   Not on file  Social History Narrative   Not on file   Social Drivers of Health   Financial Resource Strain: Low Risk  (07/22/2023)   Received from [facility]   Overall Financial Resource Strain (CARDIA)    Difficulty of Paying Living Expenses: Not hard at all  Food Insecurity: No Food Insecurity (09/17/2023)   Hunger Vital Sign    Worried About Running Out of Food in the Last Year: Never true    Ran Out of Food in the Last Year: Never  true  Transportation Needs: No Transportation Needs (09/17/2023)   PRAPARE - Administrator, Civil Service (Medical): No    Lack of Transportation (Non-Medical): No  Physical Activity: Unknown (04/23/2017)   Received from [facility], [facility]   Exercise Vital Sign    Days of Exercise per Week: Patient declined    Minutes of Exercise per Session: Patient declined  Stress: Unknown (04/23/2017)   Received from [facility], [facility]   [employer] of Occupational Health - Occupational Stress Questionnaire    Feeling of Stress : Patient declined  Social Connections: Unknown (09/17/2023)   Social Connection and Isolation Panel [NHANES]    Frequency of Communication with Friends and Family: More than three times a week    Frequency of Social  Gatherings with Friends and Family: More than three times a week    Attends Religious Services: Patient declined    Active Member of Clubs or Organizations: Patient declined    Attends Banker Meetings: Patient declined    Marital Status: Married  Catering manager Violence: Not At Risk (09/17/2023)   Humiliation, Afraid, Rape, and Kick questionnaire    Fear of Current or Ex-Partner: No    Emotionally Abused: No    Physically Abused: No    Sexually Abused: No    Family History: Family History  Problem Relation Age of Onset   Cancer Brother        2000   Prostate cancer Neg Hx    Bladder Cancer Neg Hx    Kidney cancer Neg Hx     Review of Systems:  Review of Systems - General ROS: Negative Psychological ROS: Negative Ophthalmic ROS: Negative ENT ROS: Negative Hematological and Lymphatic ROS: Negative  Endocrine ROS: Negative Respiratory ROS: Negative Cardiovascular ROS: Negative Gastrointestinal ROS: Negative Genito-Urinary ROS: Negative Musculoskeletal ROS: Positive for back pain Neurological ROS: Negative for weakness or numbness Dermatological ROS: Negative  Physical Exam: BP 124/64 (BP Location: Right Arm)   Pulse 74   Temp (!) 97.4 F (36.3 C)   Resp 16   Ht 5\' 2"  (1.575 m)   Wt 71.1 kg   SpO2 97%   BMI 28.67 kg/m  Body mass index is 28.67 kg/m. Body surface area is 1.76 meters squared. General appearance: Alert, cooperative, in no acute distress Head: Normocephalic, atraumatic Eyes: Normal, EOM intact Oropharynx: Moist without lesions Ext: No edema in LE bilaterally  Neurologic exam:  Mental status: alertness: alert, affect: normal Speech: fluent and clear Motor:strength symmetric 5/5 in bilateral lower extremities Sensory: intact to light touch in bilateral lower extremities Gait: normal   Imaging: MRI thoracic spine: There is a compression fracture with mild retropulsion of the T7 vertebral body.  There is no encroachment on the spinal  cord but mild spinal stenosis.  Alignment appears maintained.  There is mild kyphosis at this level   Impression/Plan:  Ruben Green is here for what appears to be a subacute T7 fracture.  We did discuss that the treatment for this would be bracing and therapy with pain control.  Another option would be to consider kyphoplasty and we deferred to interventional radiologist for management of this.  I do not see a role for surgery at this time but we will need to follow-up in clinic with repeat x-rays to ensure there is healing.  We do recommend wearing the brace at all times when up moving around.   1.  Diagnosis: Thoracic  fracture  2.  Plan -TLSO brace whenever out of bed, okay to work with PT -Recommend pain control -Can follow-up in clinic in 3 to 4 weeks for x-rays -Can consider kyphoplasty with interventional radiology  Lucy Chris, MD

## 2023-09-18 NOTE — Plan of Care (Signed)

## 2023-09-18 NOTE — Evaluation (Addendum)
 Physical Therapy Evaluation Patient Details Name: Ruben Green MRN: [id number] DOB: July 15, 1933 Today's Date: 09/18/2023  History of Present Illness  Pt is an 89 y.o. male with medical history significant of HTN, HDL, COPD, CAD, dCHf, anxiety, BPH, CKD 3A, bladder cancer (s/p of BCG treatment), hard of hearing, right bundle blockade, kidney stone, prostate cancer, AAA (s/p of repair), recent fall with T7 compression fracture, who presents with dysuria, difficulty urinating, poor appetite, back pain.   Clinical Impression  Patient alert, seated EOB with nursing and family in room. maxA to don TLSO, pt/family educated on donning/doffing. The pt was a bit impulsive with mobility. Sit <> stand with RW and minA for steadying, RW steadying. He was able to ambulate 160ft with RW. MinA-CGA for RW management, positioning, and obstacle navigation. spO2 on 4L 90% after ambulation. Pt in chair with needs in reach, TLSO adjusted for comfort as able.  Overall the patient demonstrated deficits (see "PT Problem List") that impede the patient's functional abilities, safety, and mobility and would benefit from skilled PT intervention.          If plan is discharge home, recommend the following: A little help with walking and/or transfers;A little help with bathing/dressing/bathroom;Assistance with cooking/housework;Assist for transportation;Help with stairs or ramp for entrance   Can travel by private vehicle        Equipment Recommendations None recommended by PT  Recommendations for Other Services       Functional Status Assessment Patient has had a recent decline in their functional status and demonstrates the ability to make significant improvements in function in a reasonable and predictable amount of time.     Precautions / Restrictions Precautions Precautions: Fall Restrictions Weight Bearing Restrictions Per Provider Order: No      Mobility  Bed Mobility               General  bed mobility comments: seated EOB with nursing and family upon PT arrival    Transfers Overall transfer level: Needs assistance Equipment used: Rolling walker (2 wheels) Transfers: Sit to/from Stand Sit to Stand: Min assist           General transfer comment: steadying assist and RW assist    Ambulation/Gait Ambulation/Gait assistance: Contact guard assist, Min assist Gait Distance (Feet): 100 Feet Assistive device: Rolling walker (2 wheels)         General Gait Details: intermittent assistance for RW management to manage obstacles, clearance in hallway  Stairs            Wheelchair Mobility     Tilt Bed    Modified Rankin (Stroke Patients Only)       Balance Overall balance assessment: Needs assistance Sitting-balance support: Feet supported Sitting balance-Leahy Scale: Good     Standing balance support: Bilateral upper extremity supported, Reliant on assistive device for balance Standing balance-Leahy Scale: Fair                               Pertinent Vitals/Pain Pain Assessment Pain Assessment: Faces Faces Pain Scale: Hurts a little bit Pain Location: back Pain Descriptors / Indicators: Aching Pain Intervention(s): Limited activity within patient's tolerance, Monitored during session, Repositioned    Home Living Family/patient expects to be discharged to:: Private residence Living Arrangements: Spouse/significant other Available Help at Discharge: Family;Available 24 hours/day Type of Home: House Home Access: Stairs to enter Entrance Stairs-Rails: Right Entrance Stairs-Number of Steps: 2   Home Layout:  One level Home Equipment: Shower seat - built Charity fundraiser (2 wheels)      Prior Function Prior Level of Function : Independent/Modified Independent;Driving;History of Falls (last six months)             Mobility Comments: no AD use. IND; 1 fall in Feb at [employer] [employer] ADLs Comments: IND     Extremity/Trunk  Assessment   Upper Extremity Assessment Upper Extremity Assessment: Generalized weakness    Lower Extremity Assessment Lower Extremity Assessment: Generalized weakness       Communication   Communication Factors Affecting Communication: Hearing impaired    Cognition Arousal: Alert Behavior During Therapy: WFL for tasks assessed/performed, Impulsive   PT - Cognitive impairments: No apparent impairments                                 Cueing       General Comments      Exercises Other Exercises Other Exercises: TLSO donned in sitting with maxA, pt/family educated   Assessment/Plan    PT Assessment Patient needs continued PT services  PT Problem List Decreased strength;Decreased range of motion;Decreased activity tolerance;Decreased balance;Decreased mobility;Decreased coordination;Pain       PT Treatment Interventions DME instruction;Neuromuscular re-education;Gait training;Stair training;Patient/family education;Functional mobility training;Therapeutic activities;Therapeutic exercise;Balance training    PT Goals (Current goals can be found in the Care Plan section)  Acute Rehab PT Goals Patient Stated Goal: to go home PT Goal Formulation: With patient Time For Goal Achievement: 10/02/23 Potential to Achieve Goals: Good    Frequency Min 2X/week     Co-evaluation               AM-PAC PT "6 Clicks" Mobility  Outcome Measure Help needed turning from your back to your side while in a flat bed without using bedrails?: A Little Help needed moving from lying on your back to sitting on the side of a flat bed without using bedrails?: A Little Help needed moving to and from a bed to a chair (including a wheelchair)?: A Little Help needed standing up from a chair using your arms (e.g., wheelchair or bedside chair)?: A Little Help needed to walk in hospital room?: A Little Help needed climbing 3-5 steps with a railing? : A Little 6 Click Score: 18     End of Session   Activity Tolerance: Patient tolerated treatment well Patient left: in chair;with call bell/phone within reach;with chair alarm set Nurse Communication: Mobility status PT Visit Diagnosis: Other abnormalities of gait and mobility (R26.89);Difficulty in walking, not elsewhere classified (R26.2);Muscle weakness (generalized) (M62.81)    Time: 2956-2130 PT Time Calculation (min) (ACUTE ONLY): 14 min   Charges:   PT Evaluation $PT Eval Low Complexity: 1 Low PT Treatments $Therapeutic Activity: 8-22 mins PT General Charges $$ ACUTE PT VISIT: 1 Visit        Olga Coaster PT, DPT 3:57 PM,09/18/23

## 2023-09-18 NOTE — Progress Notes (Signed)
 PT Cancellation Note  Patient Details Name: BRITTIAN RENALDO MRN: [id number] DOB: 10/29/1933   Cancelled Treatment:    Reason Eval/Treat Not Completed: Other (comment). PT awaiting TLSO brace arrival, to attempt as able.    Olga Coaster PT, DPT 1:19 PM,09/18/23

## 2023-09-18 NOTE — Consult Note (Signed)
 Chief Complaint: Patient was seen in consultation today for uncontrolled back pain at the request of Dr. Mayford Knife and Dr. Adriana Simas  Referring Physician(s): Dr. Mayford Knife Dr. Adriana Simas  Supervising Physician: Oley Balm  Patient Status: [facility] - In-pt  History of Present Illness: Ruben Green is a 89 y.o. male with PMHx of prostate cancer, urothelial carcinoma in situ, CAD, dystolic dysfunction, COPD, hard of hearing, CKD, kidney stones, RBBB, HTN, AAA s/p repair, BPH who presented to the ED on 3/25 with complaints of 10/10 right flank pain and back pain x 2 days, pain medication was prescribed and patient was sent home.   The patient returned to the ED on 3/28 with complaints of continued back pain and abdominal pain, CT imaging was performed which revealed moderate compression fracture at Thoracic level 7, subacute healing rib fractures, the patient was given TSLO brace and discharged.   Patient returned to ED 3/31 after he had labs drawn at PCP and found to have low sodium, he had history of recent nausea and vomiting with poor oral intake. He has been admitted with UTI, urinary retention s/p foley catheter insertion and acute renal failure. CT imaging done revealed right hydronephrosis without obstructing stone. He has been placed on IV antibiotics for UTI treatment.  IR has been consulted by primary team and neurosurgery to discuss treatment options for uncontrolled back pain and new compression fracture at thoracic level 7.   Patient complains of ongoing mid thoracic back pain rated 2/10 currently while he is resting in bed, he states the pain is worse with movement. History is obtained from patient's wife and chart review today. The wife states he fell in mid February but did not complain of pain until 1 month later and she contributes his back pain starting to all the gardening and bending over that he does. He has been taking Robaxin, Tramadol, lidoderm patch without relief and at  times refuses to wear the brace. The family is well aware of compression fractures as the wife stated that her son had some in the past and healed well with the brace.  Past Medical History:  Diagnosis Date   AAA (abdominal aortic aneurysm) (HCC)    a.) s/p repair in 2005   Anemia    Anginal pain (HCC)    Anxiety    Arthritis    B12 deficiency    Bilateral cataracts    a.) s/p BILATERAL extractions in 2018   BPH with obstruction/lower urinary tract symptoms    CAD (coronary artery disease)    Carotid artery stenosis    a.) s/p CEA on the RIGHT   CHF (congestive heart failure) (HCC)    COPD (chronic obstructive pulmonary disease) (HCC)    Diastolic dysfunction 07/29/2019   a.)  TTE 07/29/2019: EF 50-55%; LA mildly enlarged; G1DD.   DOE (dyspnea on exertion)    Elevated PSA    Environmental and seasonal allergies    History of 2019 novel coronavirus disease (COVID-19)    History of kidney stones    HLD (hyperlipidemia)    HOH (hard of hearing)    HTN (hypertension)    Incomplete bladder emptying    Macular degeneration    Nausea vomiting and diarrhea 09/16/2023   Prostate cancer (HCC)    RBBB (right bundle branch block)    Valvular insufficiency    a.) TTE 07/29/2019: LVEF 50-55%; LA mild enlarged; triv AR/PR, mild MR, mod TR.    Past Surgical History:  Procedure Laterality Date  ABDOMINAL AORTIC ANEURYSM REPAIR     CATARACT EXTRACTION W/PHACO Right 09/25/2016   Procedure: CATARACT EXTRACTION PHACO AND INTRAOCULAR LENS PLACEMENT (IOC);  Surgeon: Galen Manila, MD;  Location: [facility] ORS;  Service: Ophthalmology;  Laterality: Right;  [place] 01:01 AP% 17.4 CDE 10.75 Fluid pack lot # [id number]   CATARACT EXTRACTION W/PHACO Left 10/16/2016   Procedure: CATARACT EXTRACTION PHACO AND INTRAOCULAR LENS PLACEMENT (IOC) Suture placed in Left eye;  Surgeon: Galen Manila, MD;  Location: [facility] ORS;  Service: Ophthalmology;  Laterality: Left;  [place] 2:06.8 AP% 22.2 CDE 28.17 Fluid pack lot  # [id number]   COLONOSCOPY WITH PROPOFOL N/A 03/31/2019   Procedure: COLONOSCOPY WITH PROPOFOL;  Surgeon: Christena Deem, MD;  Location: [facility] ENDOSCOPY;  Service: Endoscopy;  Laterality: N/A;   COLONOSCOPY WITH PROPOFOL N/A 09/09/2020   Procedure: COLONOSCOPY WITH PROPOFOL;  Surgeon: Regis Bill, MD;  Location: [facility] ENDOSCOPY;  Service: Endoscopy;  Laterality: N/A;   CYSTOSCOPY W/ RETROGRADES Bilateral 02/23/2020   Procedure: CYSTOSCOPY WITH RETROGRADE PYELOGRAM;  Surgeon: Riki Altes, MD;  Location: [facility] ORS;  Service: Urology;  Laterality: Bilateral;   CYSTOSCOPY WITH BIOPSY N/A 02/23/2020   Procedure: CYSTOSCOPY WITH BIOPSY;  Surgeon: Riki Altes, MD;  Location: [facility] ORS;  Service: Urology;  Laterality: N/A;   EYE SURGERY     HERNIA REPAIR     x 2   INGUINAL HERNIA REPAIR Right 12/02/2015   Procedure: HERNIA REPAIR INGUINAL ADULT;  Surgeon: Nadeen Landau, MD;  Location: [facility] ORS;  Service: General;  Laterality: Right;   PROSTATE SURGERY  2012   Right Carotid Endarterectomy     TOTAL HIP ARTHROPLASTY Right 08/01/2021   Procedure: TOTAL HIP ARTHROPLASTY;  Surgeon: Christena Flake, MD;  Location: [facility] ORS;  Service: Orthopedics;  Laterality: Right;    Allergies: Oxycodone  Medications: Prior to Admission medications   Medication Sig Start Date End Date Taking? Authorizing Provider  acetaminophen (TYLENOL) 500 MG tablet Take 1-2 tablets (500-1,000 mg total) by mouth every 6 (six) hours as needed for mild pain. 08/04/21  Yes Anson Oregon, PA-C  albuterol (VENTOLIN HFA) 108 (90 Base) MCG/ACT inhaler Inhale 2 puffs into the lungs every 4 (four) hours as needed. 03/24/23  Yes Dionne Bucy, MD  atorvastatin (LIPITOR) 40 MG tablet Take 40 mg by mouth daily. 01/02/23  Yes [provider]  doxycycline (VIBRAMYCIN) 100 MG capsule Take 1 capsule by mouth 2 (two) times daily. 09/15/23 09/22/23 Yes [provider]  empagliflozin (JARDIANCE) 10 MG TABS  tablet Take 1 tablet (10 mg total) by mouth daily before breakfast. 07/11/23  Yes Hackney, Tina A, FNP  Fluticasone-Umeclidin-Vilant (TRELEGY ELLIPTA) 100-62.5-25 MCG/INH AEPB Inhale 2 puffs into the lungs daily. 09/01/19  Yes [provider]  iron polysaccharides (NIFEREX) 150 MG capsule Take 150 mg by mouth 2 (two) times daily. 10/23/21  Yes [provider]  LORazepam (ATIVAN) 0.5 MG tablet Take 0.5 mg by mouth daily as needed for anxiety. 10/23/21  Yes [provider]  methocarbamol (ROBAXIN) 500 MG tablet Take 1 tablet (500 mg total) by mouth every 8 (eight) hours as needed. 09/10/23  Yes Delton Prairie, MD  metoprolol succinate (TOPROL-XL) 50 MG 24 hr tablet Take 1 tablet (50 mg total) by mouth daily. Take with or immediately following a meal. 12/29/21  Yes Charise Killian, MD  omeprazole (PRILOSEC) 20 MG capsule Take 20 mg by mouth daily. 09/15/23  Yes [provider]  tamsulosin (FLOMAX) 0.4 MG CAPS capsule Take 0.4  mg by mouth daily. 10/04/22  Yes [provider]  traMADol (ULTRAM) 50 MG tablet Take 1 tablet (50 mg total) by mouth every 6 (six) hours as needed. 09/13/23 09/12/24 Yes Phineas Semen, MD  aspirin EC 81 MG tablet Take 1 tablet (81 mg total) by mouth daily. Swallow whole. Patient not taking: Reported on 09/16/2023 12/29/21   Charise Killian, MD  lidocaine (LIDODERM) 5 % Place 1 patch onto the skin every 12 (twelve) hours. Remove & Discard patch within 12 hours or as directed by MD Patient not taking: Reported on 09/16/2023 09/10/23 09/09/24  Delton Prairie, MD  torsemide (DEMADEX) 20 MG tablet Take 1 tablet (20 mg total) by mouth daily. Patient not taking: Reported on 09/16/2023 05/06/23   Delma Freeze, FNP     Family History  Problem Relation Age of Onset   Cancer Brother        2000   Prostate cancer Neg Hx    Bladder Cancer Neg Hx    Kidney cancer Neg Hx     Social History   Socioeconomic History   Marital status: Married     Spouse name: Not on file   Number of children: Not on file   Years of education: Not on file   Highest education level: Not on file  Occupational History   Not on file  Tobacco Use   Smoking status: Former    Current packs/day: 0.00    Types: Cigarettes    Quit date: 11/30/1998    Years since quitting: 24.8   Smokeless tobacco: Former   Tobacco comments:    quit 10 years ago  Vaping Use   Vaping status: Never Used  Substance and Sexual Activity   Alcohol use: Not Currently    Comment: occasional   Drug use: No   Sexual activity: Not on file  Other Topics Concern   Not on file  Social History Narrative   Not on file   Social Drivers of Health   Financial Resource Strain: Low Risk  (07/22/2023)   Received from [facility]   Overall Financial Resource Strain (CARDIA)    Difficulty of Paying Living Expenses: Not hard at all  Food Insecurity: No Food Insecurity (09/17/2023)   Hunger Vital Sign    Worried About Running Out of Food in the Last Year: Never true    Ran Out of Food in the Last Year: Never true  Transportation Needs: No Transportation Needs (09/17/2023)   PRAPARE - Administrator, Civil Service (Medical): No    Lack of Transportation (Non-Medical): No  Physical Activity: Unknown (04/23/2017)   Received from [facility], [facility]   Exercise Vital Sign    Days of Exercise per Week: Patient declined    Minutes of Exercise per Session: Patient declined  Stress: Unknown (04/23/2017)   Received from [facility], [facility]   [employer] of Occupational Health - Occupational Stress Questionnaire    Feeling of Stress : Patient declined  Social Connections: Unknown (09/17/2023)   Social Connection and Isolation Panel [NHANES]    Frequency of Communication with Friends and Family: More than three times a week    Frequency of Social Gatherings with Friends and Family:  More than three times a week    Attends Religious Services: Patient declined    Active Member of Clubs or Organizations: Patient declined    Attends Banker Meetings: Patient declined  Marital Status: Married    Review of Systems: A 12 point ROS discussed and pertinent positives are indicated in the HPI above.  All other systems are negative.  Review of Systems  Vital Signs: BP 124/64 (BP Location: Right Arm)   Pulse 74   Temp (!) 97.4 F (36.3 C)   Resp 16   Ht 5\' 2"  (1.575 m)   Wt 156 lb 12 oz (71.1 kg)   SpO2 97%   BMI 28.67 kg/m   Physical Exam Constitutional:      General: He is not in acute distress.    Comments: Patient is in and out of sleep during my visit.  HENT:     Head: Normocephalic and atraumatic.  Cardiovascular:     Rate and Rhythm: Normal rate.  Pulmonary:     Effort: Pulmonary effort is normal. No respiratory distress.     Imaging: MR THORACIC SPINE W WO CONTRAST Result Date: 09/18/2023 CLINICAL DATA:  Mid back pain with compression fracture EXAM: MRI THORACIC WITHOUT AND WITH CONTRAST TECHNIQUE: Multiplanar and multiecho pulse sequences of the thoracic spine were obtained without and with intravenous contrast. CONTRAST:  8mL GADAVIST GADOBUTROL 1 MMOL/ML IV SOLN COMPARISON:  CTA chest 09/13/2023 FINDINGS: Alignment:  Physiologic. Vertebrae: T7 burst fracture with approximately 50% stenosis and 3 mm retropulsion. Moderate bone marrow edema. No other osseous abnormality. Cord:  Normal signal and morphology. Paraspinal and other soft tissues: Negative. Disc levels: No spinal canal stenosis. IMPRESSION: Acute T7 burst fracture with approximately 50% stenosis and 3 mm retropulsion. Electronically Signed   By: Deatra Robinson M.D.   On: 09/18/2023 00:25   CT ABDOMEN PELVIS W CONTRAST Result Date: 09/16/2023 CLINICAL DATA:  Right flank pain.  Nausea and vomiting. EXAM: CT ABDOMEN AND PELVIS WITH CONTRAST TECHNIQUE: Multidetector CT imaging of the  abdomen and pelvis was performed using the standard protocol following bolus administration of intravenous contrast. RADIATION DOSE REDUCTION: This exam was performed according to the departmental dose-optimization program which includes automated exposure control, adjustment of the mA and/or kV according to patient size and/or use of iterative reconstruction technique. CONTRAST:  OMNIPAQUE IOHEXOL 300 MG/ML  SOLN COMPARISON:  CT dated 09/13/2023. FINDINGS: Lower chest: Bibasilar atelectasis/scarring. No intra-abdominal free air or free fluid. Hepatobiliary: The liver is unremarkable. No biliary dilatation. The gallbladder is unremarkable Pancreas: Unremarkable. No pancreatic ductal dilatation or surrounding inflammatory changes. Spleen: Normal in size without focal abnormality. Adrenals/Urinary Tract: The adrenal glands are unremarkable. Mild bilateral renal parenchyma atrophy. Small bilateral renal cysts. 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 There is interval development of mild right hydronephrosis and hydroureter to the level of distal ureter were the duplicated ureters join. No obstructing stone. No hydronephrosis on the left. The left ureter is unremarkable. The urinary bladder is distended. There is a small left Hutch diverticula. Stomach/Bowel: Severe sigmoid diverticulosis. There is midline ventral supraumbilical hernia containing a short segment of the transverse colon. The neck of the hernia defect measures approximately 4.5 cm in transverse axial diameter. There is no bowel obstruction or active inflammation. There is a moderate size hiatal hernia containing a portion of the stomach. The appendix is normal. Vascular/Lymphatic: Advanced atherosclerotic calcification of the abdominal aorta. An aortobifemoral bypass graft noted which appears patent. The IVC is unremarkable. No portal venous gas. There is no adenopathy. Reproductive: The prostate and seminal  vesicles are grossly unremarkable. Other: Small fat containing left inguinal hernia as well as a fat containing umbilical  hernia. The neck of the fat containing umbilical hernia is approximately 5 cm in transverse axial diameter. Musculoskeletal: Total right hip arthroplasty. Osteopenia with degenerative changes spine. No acute osseous pathology. IMPRESSION: 1. Interval development of mild right hydronephrosis and hydroureter to the level of distal ureter at the junction of the duplicated ureters. No obstructing stone. 2. Severe sigmoid diverticulosis. No bowel obstruction. Normal appendix. 3. Midline ventral supraumbilical hernia containing a short segment of the transverse colon. No bowel obstruction. 4. Moderate size hiatal hernia containing a portion of the stomach. Electronically Signed   By: Elgie Collard M.D.   On: 09/16/2023 16:55   CT Angio Chest/Abd/Pel for Dissection W and/or Wo Contrast Result Date: 09/13/2023 CLINICAL DATA:  back/abdominal pain, hx of aneurysm. EXAM: CT ANGIOGRAPHY CHEST, ABDOMEN AND PELVIS TECHNIQUE: Non-contrast CT of the chest was initially obtained. Multidetector CT imaging through the chest, abdomen and pelvis was performed using the standard protocol during bolus administration of intravenous contrast. Multiplanar reconstructed images and MIPs were obtained and reviewed to evaluate the vascular anatomy. RADIATION DOSE REDUCTION: This exam was performed according to the departmental dose-optimization program which includes automated exposure control, adjustment of the mA and/or kV according to patient size and/or use of iterative reconstruction technique. CONTRAST:  75mL OMNIPAQUE IOHEXOL 350 MG/ML SOLN COMPARISON:  CT scan chest from 12/25/2021 and CT scan abdomen and pelvis from 12/06/2022. FINDINGS: CTA CHEST FINDINGS Cardiovascular: No intramural hematoma noted in the thoracic aorta on the unenhanced images. Thoracic aorta is normal in caliber without aneurysm,  dissection, vasculitis or significant stenosis. Normal cardiac size. No pericardial effusion. No aortic aneurysm. There are coronary artery calcifications, in keeping with coronary artery disease. There are also moderate to severe peripheral atherosclerotic vascular calcifications of thoracic aorta and its major branches. Mediastinum/Nodes: Visualized thyroid gland appears grossly unremarkable. No solid / cystic mediastinal masses. The esophagus is nondistended precluding optimal assessment. No axillary, mediastinal or hilar lymphadenopathy by size criteria. Lungs/Pleura: The central tracheo-bronchial tree is patent. There are moderate emphysematous changes with upper lobe predominance. There are patchy areas of linear, plate-like atelectasis and/or scarring throughout bilateral lungs. No mass or consolidation. No pleural effusion or pneumothorax. No suspicious lung nodules. Musculoskeletal: The visualized soft tissues of the chest wall are grossly unremarkable. No suspicious osseous lesions. There is diffuse osteopenia of the visualized osseous structures. There are mild to moderate multilevel degenerative changes in the visualized spine. There is moderate compression deformity of T7 vertebral body, of indeterminate age, but new since the prior study from 12/25/2021. No significant retropulsion or spinal canal compromise. There are subacute/healing fractures of anteromedial right fifth through seventh ribs. There also subacute/healing fractures of left anteromedial sixth through eighth ribs. Review of the MIP images confirms the above findings. CTA ABDOMEN AND PELVIS FINDINGS VASCULAR Aorta: Normal caliber abdominal aorta.  Aorto bi-iliac graft noted. Celiac: There is dense calcified plaque at the origin of the celiac trunk. The artery is otherwise patent without evidence of aneurysm, dissection or vasculitis. SMA: Patent without evidence of aneurysm, dissection, vasculitis or significant stenosis. Renals: Both  renal arteries are patent without evidence of aneurysm, dissection, vasculitis, fibromuscular dysplasia or significant stenosis. IMA: Patent without evidence of aneurysm, dissection, vasculitis or significant stenosis. Inflow: Aorto bi-iliac graft extends into the left external iliac artery as well, which is patent. There is complete occlusion of stented left internal iliac artery. The remaining arteries are patent without evidence of aneurysm, dissection, vasculitis or significant stenosis. Veins: No obvious venous abnormality within the limitations  of this arterial phase study. Review of the MIP images confirms the above findings. NON-VASCULAR Hepatobiliary: The liver is normal in size. Non-cirrhotic configuration. No suspicious mass. No intrahepatic or extrahepatic bile duct dilation. No calcified gallstones. Normal gallbladder wall thickness. No pericholecystic inflammatory changes. Pancreas: Unremarkable. No pancreatic ductal dilatation or surrounding inflammatory changes. Spleen: Within normal limits. No focal lesion. Adrenals/Urinary Tract: Adrenal glands are unremarkable. No suspicious renal mass. Small/atrophic left kidney noted. There are multiple cysts throughout bilateral kidneys with largest in the right kidney interpolar region measuring up to 2.5 x 3.0 cm. No hydroureteronephrosis or nephroureterolithiasis. Evaluation of urinary bladder is markedly limited due to extensive streak artifacts from right hip arthroplasty. Stomach/Bowel: There is moderate sized sliding hiatal hernia containing proximal half of the stomach. No disproportionate dilation of the small or large bowel loops. No evidence of abnormal bowel wall thickening or inflammatory changes. The appendix is unremarkable. There are innumerable diverticula mainly in the left hemi colon, without imaging signs of diverticulitis. Vascular/Lymphatic: No ascites or pneumoperitoneum. No abdominal or pelvic lymphadenopathy, by size criteria.  Reproductive: Markedly limited evaluation of prostate gland due to extensive streak artifacts from right hip arthroplasty. Other: There is midline supraumbilical ventral hernia containing fat and small portion of wall of nonobstructed transverse colon. There is small to moderate fat containing periumbilical hernia and a small fat containing left inguinal hernia. The soft tissues and abdominal wall are otherwise unremarkable. Musculoskeletal: No suspicious osseous lesions. There are mild multilevel degenerative changes in the visualized spine. There is diffuse osteopenia of the visualized osseous structures. Right hip arthroplasty noted. Review of the MIP images confirms the above findings. IMPRESSION: 1. No acute aortic syndrome. No thoracic acute intramural hematoma. No thoracic or abdominal aortic aneurysm, dissection or penetrating atherosclerotic ulcer. 2. There is moderate compression deformity of T7 vertebral body, of indeterminate age, but new since the prior study from 12/25/2021. No significant retropulsion or spinal canal compromise. 3. There are subacute/healing fractures of anteromedial right fifth through seventh ribs and left anteromedial sixth through eighth ribs. 4. Multiple other nonacute observations, as described above. Aortic Atherosclerosis (ICD10-I70.0) and Emphysema (ICD10-J43.9). Electronically Signed   By: Jules Schick M.D.   On: 09/13/2023 12:03   DG Chest Port 1 View Result Date: 09/10/2023 CLINICAL DATA:  Neck pain.  Right flank pain. EXAM: PORTABLE CHEST 1 VIEW COMPARISON:  03/24/2023. FINDINGS: Low lung volume. Bilateral lung fields are clear. Bilateral costophrenic angles are clear. Note is made of elevated left hemidiaphragm. Stable cardio-mediastinal silhouette. No acute osseous abnormalities. The soft tissues are within normal limits. IMPRESSION: No active disease. Electronically Signed   By: Jules Schick M.D.   On: 09/10/2023 11:38   Labs:  CBC: Recent Labs     09/10/23 0951 09/13/23 0927 09/16/23 1113 09/17/23 0248  WBC 7.3 9.8 11.7* 9.0  HGB 11.6* 12.4* 12.9* 11.9*  HCT 36.5* 37.8* 38.0* 35.1*  PLT 213 272 301 238    COAGS: No results for input(s): "INR", "APTT" in the last 8760 hours.  BMP: Recent Labs    09/16/23 1113 09/16/23 2032 09/17/23 0248 09/18/23 0407  NA 120* 124* 125* 124*  K 4.3 3.9 3.6 3.8  CL 85* 87* 91* 89*  CO2 22 24 22 24   GLUCOSE 72 175* 115* 122*  BUN 48* 46* 43* 49*  CALCIUM 8.7* 8.6* 8.5* 7.9*  CREATININE 1.72* 1.70* 1.60* 1.87*  GFRNONAA 38* 39* 41* 34*    LIVER FUNCTION TESTS: Recent Labs    12/05/22 2330 09/10/23  4098 09/13/23 0927  BILITOT 1.2 0.6 0.8  AST 23 20 28   ALT 16 12 13   ALKPHOS 102 84 89  PROT 7.8 6.7 6.9  ALBUMIN 4.4 3.6 3.6    Assessment and Plan: This is a 89 year old male with PMHx of prostate cancer, urothelial carcinoma in situ, CAD, dystolic dysfunction, COPD, hard of hearing, CKD, kidney stones, RBBB, HTN, AAA s/p repair, BPH who presented to the ED on 3/25 with complaints of 10/10 right flank pain and back pain x 2 days, pain medication was prescribed and patient was sent home.   The patient returned to the ED on 3/28 with complaints of continued back pain and abdominal pain, CT imaging was performed which revealed moderate compression fracture at Thoracic level 7, subacute healing rib fractures, the patient was given TSLO brace and discharged.   Patient returned to ED 3/31 after he had labs drawn at PCP and found to have low sodium, he had history of recent nausea and vomiting with poor oral intake. He has been admitted with UTI, urinary retention s/p foley catheter insertion and acute renal failure. CT imaging done revealed right hydronephrosis without obstructing stone.   IR has been consulted by primary team and neurosurgery to discuss treatment options for uncontrolled back pain and new compression fracture at thoracic level 7. MRI has been obtained and reviewed by Dr.  Deanne Coffer, T 7 compression fracture is acute and amendable to percutaneous treatment with vertebral augmentation/kyphoplasty.   The patient is on aspirin 81mg , imaging, labs and vitals have been reviewed. Dr. Deanne Coffer is aware of the aspirin, ok to proceed. Patient is on Rocephin since 3/31 for UTI.  Risks and benefits of image guided thoracic level 7 vertebral augmentation and kyphoplasty were discussed with the patient and his wife and their son who was on the phone including, but not limited to education regarding the natural healing process of compression fractures without intervention, bleeding, infection, cement migration which may cause spinal cord damage, paralysis, pulmonary embolism, need for sedation and ability to lay flat on his stomach for an hour during the procedure.   At this time the patient's wife and son would like to hold off on any procedural interventions given the patient's age and co morbidities, they would like to continue to use the TSLO brace and medication management. Please feel free to reach out with any concerns or questions.    Thank you for this interesting consult.  I greatly enjoyed meeting Ruben Green and look forward to participating in their care.  A copy of this report was sent to the requesting provider on this date.  Electronically Signed: Berneta Levins, PA-C 09/18/2023, 12:46 PM   I spent a total of 40 Minutes in face to face in clinical consultation, greater than 50% of which was counseling/coordinating care for uncontrolled back pain with compression fracture.

## 2023-09-18 NOTE — Progress Notes (Signed)
 Heart Failure Navigator Progress Note  Patient is HOH. Spoke with his son at the bedside. Patient with a history of CHF and established with the [facility].Hospitalized for Acute right-sided thoracic back pain & Hyperkalemia.  Compression fracture of Verebrae T7.  Reinforced Heart Failure education in regards to signs and symptoms of Heart Failure with son.  Patient currently does not have a follow-up appointment until 12/19/23 @ 2:30 pm. Encouraged family to call for an appointment if needed since patient is at risk of developing CHF exacerbation per MD note. Navigator available for reassessment of patient to schedule hospital follow-up if needed at the [facility].     Roxy Horseman, RN, BSN [facility] Heart Failure Navigator Secure Chat Only

## 2023-09-18 NOTE — Progress Notes (Signed)
 OT Cancellation Note  Patient Details Name: Ruben Green MRN: [id number] DOB: 1934-01-16   Cancelled Treatment:    Reason Eval/Treat Not Completed: Other (comment)per neurosurgery note and discussion with neurosugery, TLSO required for OOB. Per chart, TLSO delivered while in ED on 3/28, wife reports it is at home (spoke with her around 13:45). Discussed with wife to bring it up to hospital so we can mobilize patient per POC. She is in agreement.   Oleta Mouse, OTD OTR/L  09/18/23, 1:48 PM

## 2023-09-19 ENCOUNTER — Telehealth: Payer: Self-pay

## 2023-09-19 DIAGNOSIS — E871 Hypo-osmolality and hyponatremia: Secondary | ICD-10-CM | POA: Diagnosis not present

## 2023-09-19 LAB — BASIC METABOLIC PANEL WITH GFR
Anion gap: 10 (ref 5–15)
BUN: 45 mg/dL — ABNORMAL HIGH (ref 8–23)
CO2: 24 mmol/L (ref 22–32)
Calcium: 8.3 mg/dL — ABNORMAL LOW (ref 8.9–10.3)
Chloride: 95 mmol/L — ABNORMAL LOW (ref 98–111)
Creatinine, Ser: 1.8 mg/dL — ABNORMAL HIGH (ref 0.61–1.24)
GFR, Estimated: 36 mL/min — ABNORMAL LOW (ref >60)
Glucose, Bld: 104 mg/dL — ABNORMAL HIGH (ref 70–99)
Potassium: 3.8 mmol/L (ref 3.5–5.1)
Sodium: 129 mmol/L — ABNORMAL LOW (ref 135–145)

## 2023-09-19 MED ORDER — TRAMADOL HCL 50 MG PO TABS: 50.0000 mg | ORAL_TABLET | ORAL | 0 refills | Status: AC | PRN

## 2023-09-19 MED ORDER — AMOXICILLIN 500 MG PO TABS: 500.0000 mg | ORAL_TABLET | Freq: Two times a day (BID) | ORAL | 0 refills | Status: AC

## 2023-09-19 NOTE — Progress Notes (Signed)
 This RN provided discharge instructions and teaching to the patient and the patient's family. Both parties verbalized and demonstrated understanding of the provided instructions. All outstanding questions resolved. PIVs removed. Both cannulas intact. Pt tolerated well. All belongings packed and in tow.

## 2023-09-19 NOTE — Discharge Summary (Addendum)
 Physician Discharge Summary  Ruben Green ZOX:[id number] DOB: [birth year] DOA: 09/16/2023  PCP: Barbette Reichmann, MD  Admit date: 09/16/2023 Discharge date: 09/19/2023  Admitted From: home  Disposition:  home  Recommendations for Outpatient Follow-up:  Follow up with PCP within 1 week F/u w/ neuro surg, Dr. Adriana Simas, in 3-4 weeks Get BMP to check sodium level within 1 week   Home Health:  Equipment/Devices:  Discharge Condition: stable CODE STATUS: full  Diet recommendation: Regular  Brief/Interim Summary: HPI was taken from Dr. Clyde Lundborg:  Chimaobi CREW GOREN is a 89 y.o. male with medical history significant of HTN, HDL, COPD, CAD, dCHf, anxiety, BPH, CKD 3A, bladder cancer (s/p of BCG treatment), hard of hearing, right bundle blockade, kidney stone, prostate cancer, AAA (s/p of repair), recent fall with T7 compression fracture, who presents with dysuria, difficulty urinating, poor appetite, back pain.   Per patient and his daughter-in-law at the bedside, patient has difficulty urinating and dysuria with burning on urination intermittently in the past several days.  No hematuria.  Recently patient had fall with T7 compression fracture by x-ray on 3/28/205.  He continues to have back pain, which is constant, aching, moderate, nonradiating.  Patient does not have chest pain, cough, SOB.  No fever or chills.  Patient has poor appetite and decreased oral intake.  He denies vomiting, diarrhea or abdominal pain to me.  Patient was found to have acute urinary retention with residual volume 865 in ED.  Foley catheter is placed in ED.   Data reviewed independently and ED Course: pt was found to have BNP 1224, WBC 11.7, sodium 120, positive UA (turbid appearance, large amount of leukocyte, few bacteria, WBC > 50), worsening renal function with creatinine 1.72, BUN 48 and GFR 38 (recent baseline creatinine 1.45 on 09/13/2023).  Temperature normal, blood pressure 126/72, heart rate 53, 116, RR 18, oxygen  saturation 90-95% on room air.  Patient is admitted to telemetry bed as inpatient.     CT of abdomen/pelvis: 1. Interval development of mild right hydronephrosis and hydroureter to the level of distal ureter at the junction of the duplicated ureters. No obstructing stone. 2. Severe sigmoid diverticulosis. No bowel obstruction. Normal appendix. 3. Midline ventral supraumbilical hernia containing a short segment of the transverse colon. No bowel obstruction. 4. Moderate size hiatal hernia containing a portion of the stomach.    Discharge Diagnoses:  Principal Problem:   Hyponatremia Active Problems:   UTI (urinary tract infection)   Benign prostatic hyperplasia with urinary obstruction   Acute urinary retention   Chronic diastolic CHF (congestive heart failure) (HCC)   Acute renal failure superimposed on stage 3a chronic kidney disease (HCC)   HLD (hyperlipidemia)   CAD (coronary artery disease)   COPD (chronic obstructive pulmonary disease) (HCC)   Compression fracture of T7 vertebra (HCC)   Iron deficiency anemia due to chronic blood loss   Anxiety, generalized   Obesity (BMI 30-39.9)  Hyponatremia: improving. Get BMP within 1 week outpatient to check sodium level. Encourage po intake    UTI: urine cx growing enterococcus faecalis. Abxs changed to IV amp while inpatient and d/c home on po amoxicillin to complete the course   Compression fracture on T7: likely secondary to fall in Feb 2025 at the grocery store. Pt did not seek medical attention at that time. Pt decline kyphoplasty at this time and wanted to continue w/ TLSO brace.    BPH: continue on flomax   Acute urinary retention: s/p foley placed on 09/16/23  in the ER. Voiding trial on the day of d/c and pt was able to urinate independently after foley was removed.    Chronic diastolic CHF: echo on 12/24/2021 showed EF of 50-55% with grade 1 diastolic dysfunction. Appears compensated. Monitor I/Os. Pt is no longer taking  torsemide at home    AKI on CKDIIIa: likely secondary to UTI and urinary retention. Cr is labile    HLD: continue on statin    Hx of CAD: continue on aspirin, statin    COPD: w/o exacerbation. Bronchodilators prn    IDA: continue on iron supplement    Generalized anxiety: severity unknown. Ativan prn    Obesity: BMI 28.6. Would benefit from weight loss   Discharge Instructions  Discharge Instructions     Diet general   Complete by: As directed    Discharge instructions   Complete by: As directed    F/u w/ PCP within 1 week. Check BMP to check sodium level as sodium was improving but not back to normal yet   Increase activity slowly   Complete by: As directed       Allergies as of 09/19/2023       Reactions   Oxycodone Other (See Comments)   Caused hallucinations/does not want anymore        Medication List     STOP taking these medications    doxycycline 100 MG capsule Commonly known as: VIBRAMYCIN       TAKE these medications    acetaminophen 500 MG tablet Commonly known as: TYLENOL Take 1-2 tablets (500-1,000 mg total) by mouth every 6 (six) hours as needed for mild pain.   albuterol 108 (90 Base) MCG/ACT inhaler Commonly known as: VENTOLIN HFA Inhale 2 puffs into the lungs every 4 (four) hours as needed.   amoxicillin 500 MG tablet Commonly known as: AMOXIL Take 1 tablet (500 mg total) by mouth 2 (two) times daily for 5 days.   aspirin EC 81 MG tablet Take 1 tablet (81 mg total) by mouth daily. Swallow whole.   atorvastatin 40 MG tablet Commonly known as: LIPITOR Take 40 mg by mouth daily.   empagliflozin 10 MG Tabs tablet Commonly known as: Jardiance Take 1 tablet (10 mg total) by mouth daily before breakfast.   iron polysaccharides 150 MG capsule Commonly known as: NIFEREX Take 150 mg by mouth 2 (two) times daily.   lidocaine 5 % Commonly known as: Lidoderm Place 1 patch onto the skin every 12 (twelve) hours. Remove & Discard patch  within 12 hours or as directed by MD   LORazepam 0.5 MG tablet Commonly known as: ATIVAN Take 0.5 mg by mouth daily as needed for anxiety.   methocarbamol 500 MG tablet Commonly known as: ROBAXIN Take 1 tablet (500 mg total) by mouth every 8 (eight) hours as needed.   metoprolol succinate 50 MG 24 hr tablet Commonly known as: TOPROL-XL Take 1 tablet (50 mg total) by mouth daily. Take with or immediately following a meal.   omeprazole 20 MG capsule Commonly known as: PRILOSEC Take 20 mg by mouth daily.   tamsulosin 0.4 MG Caps capsule Commonly known as: FLOMAX Take 0.4 mg by mouth daily.   torsemide 20 MG tablet Commonly known as: DEMADEX Take 1 tablet (20 mg total) by mouth daily.   traMADol 50 MG tablet Commonly known as: ULTRAM Take 1 tablet (50 mg total) by mouth every 4 (four) hours as needed for moderate pain (pain score 4-6) or severe pain (pain score  7-10). What changed:  when to take this reasons to take this   Trelegy Ellipta 100-62.5-25 MCG/INH Aepb Generic drug: Fluticasone-Umeclidin-Vilant Inhale 2 puffs into the lungs daily.        Follow-up Information     Lucy Chris, MD Follow up.   Specialty: Neurosurgery Why: F/u in 3-4 weeks Contact information: [address] [place] [address] [phone]         Barbette Reichmann, MD Follow up.   Specialty: Internal Medicine Why: F/u within 1 week Contact information: [address] [place] [address] [phone]                Allergies  Allergen Reactions   Oxycodone Other (See Comments)    Caused hallucinations/does not want anymore    Consultations:   Procedures/Studies: MR THORACIC SPINE W WO CONTRAST Result Date: 09/18/2023 CLINICAL DATA:  Mid back pain with compression fracture EXAM: MRI THORACIC WITHOUT AND WITH CONTRAST TECHNIQUE: Multiplanar and multiecho pulse sequences of the thoracic spine were obtained without and with  intravenous contrast. CONTRAST:  8mL GADAVIST GADOBUTROL 1 MMOL/ML IV SOLN COMPARISON:  CTA chest 09/13/2023 FINDINGS: Alignment:  Physiologic. Vertebrae: T7 burst fracture with approximately 50% stenosis and 3 mm retropulsion. Moderate bone marrow edema. No other osseous abnormality. Cord:  Normal signal and morphology. Paraspinal and other soft tissues: Negative. Disc levels: No spinal canal stenosis. IMPRESSION: Acute T7 burst fracture with approximately 50% stenosis and 3 mm retropulsion. Electronically Signed   By: Deatra Robinson M.D.   On: 09/18/2023 00:25   CT ABDOMEN PELVIS W CONTRAST Result Date: 09/16/2023 CLINICAL DATA:  Right flank pain.  Nausea and vomiting. EXAM: CT ABDOMEN AND PELVIS WITH CONTRAST TECHNIQUE: Multidetector CT imaging of the abdomen and pelvis was performed using the standard protocol following bolus administration of intravenous contrast. RADIATION DOSE REDUCTION: This exam was performed according to the departmental dose-optimization program which includes automated exposure control, adjustment of the mA and/or kV according to patient size and/or use of iterative reconstruction technique. CONTRAST:  OMNIPAQUE IOHEXOL 300 MG/ML  SOLN COMPARISON:  CT dated 09/13/2023. FINDINGS: Lower chest: Bibasilar atelectasis/scarring. No intra-abdominal free air or free fluid. Hepatobiliary: The liver is unremarkable. No biliary dilatation. The gallbladder is unremarkable Pancreas: Unremarkable. No pancreatic ductal dilatation or surrounding inflammatory changes. Spleen: Normal in size without focal abnormality. Adrenals/Urinary Tract: The adrenal glands are unremarkable. Mild bilateral renal parenchyma atrophy. Small bilateral renal cysts. 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 There is interval development of mild right hydronephrosis and hydroureter to the level of distal ureter were the duplicated ureters join. No obstructing stone. No  hydronephrosis on the left. The left ureter is unremarkable. The urinary bladder is distended. There is a small left Hutch diverticula. Stomach/Bowel: Severe sigmoid diverticulosis. There is midline ventral supraumbilical hernia containing a short segment of the transverse colon. The neck of the hernia defect measures approximately 4.5 cm in transverse axial diameter. There is no bowel obstruction or active inflammation. There is a moderate size hiatal hernia containing a portion of the stomach. The appendix is normal. Vascular/Lymphatic: Advanced atherosclerotic calcification of the abdominal aorta. An aortobifemoral bypass graft noted which appears patent. The IVC is unremarkable. No portal venous gas. There is no adenopathy. Reproductive: The prostate and seminal vesicles are grossly unremarkable. Other: Small fat containing left inguinal hernia as well as a fat containing umbilical hernia. The neck of the fat containing umbilical hernia is approximately 5  cm in transverse axial diameter. Musculoskeletal: Total right hip arthroplasty. Osteopenia with degenerative changes spine. No acute osseous pathology. IMPRESSION: 1. Interval development of mild right hydronephrosis and hydroureter to the level of distal ureter at the junction of the duplicated ureters. No obstructing stone. 2. Severe sigmoid diverticulosis. No bowel obstruction. Normal appendix. 3. Midline ventral supraumbilical hernia containing a short segment of the transverse colon. No bowel obstruction. 4. Moderate size hiatal hernia containing a portion of the stomach. Electronically Signed   By: Elgie Collard M.D.   On: 09/16/2023 16:55   CT Angio Chest/Abd/Pel for Dissection W and/or Wo Contrast Result Date: 09/13/2023 CLINICAL DATA:  back/abdominal pain, hx of aneurysm. EXAM: CT ANGIOGRAPHY CHEST, ABDOMEN AND PELVIS TECHNIQUE: Non-contrast CT of the chest was initially obtained. Multidetector CT imaging through the chest, abdomen and pelvis was  performed using the standard protocol during bolus administration of intravenous contrast. Multiplanar reconstructed images and MIPs were obtained and reviewed to evaluate the vascular anatomy. RADIATION DOSE REDUCTION: This exam was performed according to the departmental dose-optimization program which includes automated exposure control, adjustment of the mA and/or kV according to patient size and/or use of iterative reconstruction technique. CONTRAST:  75mL OMNIPAQUE IOHEXOL 350 MG/ML SOLN COMPARISON:  CT scan chest from 12/25/2021 and CT scan abdomen and pelvis from 12/06/2022. FINDINGS: CTA CHEST FINDINGS Cardiovascular: No intramural hematoma noted in the thoracic aorta on the unenhanced images. Thoracic aorta is normal in caliber without aneurysm, dissection, vasculitis or significant stenosis. Normal cardiac size. No pericardial effusion. No aortic aneurysm. There are coronary artery calcifications, in keeping with coronary artery disease. There are also moderate to severe peripheral atherosclerotic vascular calcifications of thoracic aorta and its major branches. Mediastinum/Nodes: Visualized thyroid gland appears grossly unremarkable. No solid / cystic mediastinal masses. The esophagus is nondistended precluding optimal assessment. No axillary, mediastinal or hilar lymphadenopathy by size criteria. Lungs/Pleura: The central tracheo-bronchial tree is patent. There are moderate emphysematous changes with upper lobe predominance. There are patchy areas of linear, plate-like atelectasis and/or scarring throughout bilateral lungs. No mass or consolidation. No pleural effusion or pneumothorax. No suspicious lung nodules. Musculoskeletal: The visualized soft tissues of the chest wall are grossly unremarkable. No suspicious osseous lesions. There is diffuse osteopenia of the visualized osseous structures. There are mild to moderate multilevel degenerative changes in the visualized spine. There is moderate  compression deformity of T7 vertebral body, of indeterminate age, but new since the prior study from 12/25/2021. No significant retropulsion or spinal canal compromise. There are subacute/healing fractures of anteromedial right fifth through seventh ribs. There also subacute/healing fractures of left anteromedial sixth through eighth ribs. Review of the MIP images confirms the above findings. CTA ABDOMEN AND PELVIS FINDINGS VASCULAR Aorta: Normal caliber abdominal aorta.  Aorto bi-iliac graft noted. Celiac: There is dense calcified plaque at the origin of the celiac trunk. The artery is otherwise patent without evidence of aneurysm, dissection or vasculitis. SMA: Patent without evidence of aneurysm, dissection, vasculitis or significant stenosis. Renals: Both renal arteries are patent without evidence of aneurysm, dissection, vasculitis, fibromuscular dysplasia or significant stenosis. IMA: Patent without evidence of aneurysm, dissection, vasculitis or significant stenosis. Inflow: Aorto bi-iliac graft extends into the left external iliac artery as well, which is patent. There is complete occlusion of stented left internal iliac artery. The remaining arteries are patent without evidence of aneurysm, dissection, vasculitis or significant stenosis. Veins: No obvious venous abnormality within the limitations of this arterial phase study. Review of the MIP images confirms the  above findings. NON-VASCULAR Hepatobiliary: The liver is normal in size. Non-cirrhotic configuration. No suspicious mass. No intrahepatic or extrahepatic bile duct dilation. No calcified gallstones. Normal gallbladder wall thickness. No pericholecystic inflammatory changes. Pancreas: Unremarkable. No pancreatic ductal dilatation or surrounding inflammatory changes. Spleen: Within normal limits. No focal lesion. Adrenals/Urinary Tract: Adrenal glands are unremarkable. No suspicious renal mass. Small/atrophic left kidney noted. There are multiple  cysts throughout bilateral kidneys with largest in the right kidney interpolar region measuring up to 2.5 x 3.0 cm. No hydroureteronephrosis or nephroureterolithiasis. Evaluation of urinary bladder is markedly limited due to extensive streak artifacts from right hip arthroplasty. Stomach/Bowel: There is moderate sized sliding hiatal hernia containing proximal half of the stomach. No disproportionate dilation of the small or large bowel loops. No evidence of abnormal bowel wall thickening or inflammatory changes. The appendix is unremarkable. There are innumerable diverticula mainly in the left hemi colon, without imaging signs of diverticulitis. Vascular/Lymphatic: No ascites or pneumoperitoneum. No abdominal or pelvic lymphadenopathy, by size criteria. Reproductive: Markedly limited evaluation of prostate gland due to extensive streak artifacts from right hip arthroplasty. Other: There is midline supraumbilical ventral hernia containing fat and small portion of wall of nonobstructed transverse colon. There is small to moderate fat containing periumbilical hernia and a small fat containing left inguinal hernia. The soft tissues and abdominal wall are otherwise unremarkable. Musculoskeletal: No suspicious osseous lesions. There are mild multilevel degenerative changes in the visualized spine. There is diffuse osteopenia of the visualized osseous structures. Right hip arthroplasty noted. Review of the MIP images confirms the above findings. IMPRESSION: 1. No acute aortic syndrome. No thoracic acute intramural hematoma. No thoracic or abdominal aortic aneurysm, dissection or penetrating atherosclerotic ulcer. 2. There is moderate compression deformity of T7 vertebral body, of indeterminate age, but new since the prior study from 12/25/2021. No significant retropulsion or spinal canal compromise. 3. There are subacute/healing fractures of anteromedial right fifth through seventh ribs and left anteromedial sixth through  eighth ribs. 4. Multiple other nonacute observations, as described above. Aortic Atherosclerosis (ICD10-I70.0) and Emphysema (ICD10-J43.9). Electronically Signed   By: Jules Schick M.D.   On: 09/13/2023 12:03   DG Chest Port 1 View Result Date: 09/10/2023 CLINICAL DATA:  Neck pain.  Right flank pain. EXAM: PORTABLE CHEST 1 VIEW COMPARISON:  03/24/2023. FINDINGS: Low lung volume. Bilateral lung fields are clear. Bilateral costophrenic angles are clear. Note is made of elevated left hemidiaphragm. Stable cardio-mediastinal silhouette. No acute osseous abnormalities. The soft tissues are within normal limits. IMPRESSION: No active disease. Electronically Signed   By: Jules Schick M.D.   On: 09/10/2023 11:38   (Echo, Carotid, EGD, Colonoscopy, ERCP)    Subjective: pt c/o fatigue    Discharge Exam: Vitals:   09/19/23 0400 09/19/23 0801  BP: 128/70 139/85  Pulse: 76 80  Resp: 19 19  Temp: (!) 97.5 F (36.4 C) 97.6 F (36.4 C)  SpO2:  94%   Vitals:   09/18/23 1600 09/18/23 2230 09/19/23 0400 09/19/23 0801  BP: 117/64 (!) 112/55 128/70 139/85  Pulse: 84 77 76 80  Resp: 18  19 19   Temp: 97.7 F (36.5 C) (!) 97.4 F (36.3 C) (!) 97.5 F (36.4 C) 97.6 F (36.4 C)  TempSrc: Oral Axillary Axillary Axillary  SpO2: 95% 96%  94%  Weight:   72.5 kg   Height:        General: Pt is alert, awake, not in acute distress Cardiovascular: S1/S2 +, no rubs, no gallops Respiratory: CTA bilaterally,  no wheezing, no rhonchi Abdominal: Soft, NT, ND, bowel sounds + Extremities: no cyanosis    The results of significant diagnostics from this hospitalization (including imaging, microbiology, ancillary and laboratory) are listed below for reference.     Microbiology: Recent Results (from the past 240 hours)  Urine Culture (for pregnant, neutropenic or urologic patients or patients with an indwelling urinary catheter)     Status: Abnormal   Collection Time: 09/16/23  3:43 PM   Specimen: Urine,  Clean Catch  Result Value Ref Range Status   Specimen Description   Final    URINE, CLEAN CATCH Performed at [facility], [address] [place], [place] [address]    Special Requests   Final    NONE Performed at [facility], [address] [place], [place] [address]    Culture >=100,000 COLONIES/mL ENTEROCOCCUS FAECALIS (A)  Final   Report Status 09/18/2023 FINAL  Final   Organism ID, Bacteria ENTEROCOCCUS FAECALIS (A)  Final      Susceptibility   Enterococcus faecalis - MIC*    AMPICILLIN <=2 SENSITIVE Sensitive     NITROFURANTOIN <=16 SENSITIVE Sensitive     VANCOMYCIN 1 SENSITIVE Sensitive     * >=100,000 COLONIES/mL ENTEROCOCCUS FAECALIS     Labs: BNP (last 3 results) Recent Labs    06/17/23 0942 07/11/23 1520 09/16/23 1543  BNP 111.8* 64.2 1,224.5*   Basic Metabolic Panel: Recent Labs  Lab 09/16/23 1113 09/16/23 2032 09/17/23 0248 09/18/23 0407 09/19/23 0348  NA 120* 124* 125* 124* 129*  K 4.3 3.9 3.6 3.8 3.8  CL 85* 87* 91* 89* 95*  CO2 22 24 22 24 24   GLUCOSE 72 175* 115* 122* 104*  BUN 48* 46* 43* 49* 45*  CREATININE 1.72* 1.70* 1.60* 1.87* 1.80*  CALCIUM 8.7* 8.6* 8.5* 7.9* 8.3*  MG 2.0  --   --   --   --   PHOS 4.0  --   --   --   --    Liver Function Tests: Recent Labs  Lab 09/13/23 0927  AST 28  ALT 13  ALKPHOS 89  BILITOT 0.8  PROT 6.9  ALBUMIN 3.6   Recent Labs  Lab 09/13/23 0927  LIPASE 24   No results for input(s): "AMMONIA" in the last 168 hours. CBC: Recent Labs  Lab 09/13/23 0927 09/16/23 1113 09/17/23 0248  WBC 9.8 11.7* 9.0  HGB 12.4* 12.9* 11.9*  HCT 37.8* 38.0* 35.1*  MCV 93.3 91.1 91.2  PLT 272 301 238   Cardiac Enzymes: No results for input(s): "CKTOTAL", "CKMB", "CKMBINDEX", "TROPONINI" in the last 168 hours. BNP: Invalid input(s): "POCBNP" CBG: Recent Labs  Lab 09/17/23 2354  GLUCAP 146*   D-Dimer No results for input(s): "DDIMER" in the last 72 hours. Hgb A1c No results for  input(s): "HGBA1C" in the last 72 hours. Lipid Profile No results for input(s): "CHOL", "HDL", "LDLCALC", "TRIG", "CHOLHDL", "LDLDIRECT" in the last 72 hours. Thyroid function studies No results for input(s): "TSH", "T4TOTAL", "T3FREE", "THYROIDAB" in the last 72 hours.  Invalid input(s): "FREET3" Anemia work up No results for input(s): "VITAMINB12", "FOLATE", "FERRITIN", "TIBC", "IRON", "RETICCTPCT" in the last 72 hours. Urinalysis    Component Value Date/Time   COLORURINE YELLOW (A) 09/16/2023 1543   APPEARANCEUR TURBID (A) 09/16/2023 1543   APPEARANCEUR Clear 09/04/2023 0812   LABSPEC 1.011 09/16/2023 1543   PHURINE 5.0 09/16/2023 1543   GLUCOSEU NEGATIVE 09/16/2023 1543   HGBUR SMALL (A) 09/16/2023 1543   BILIRUBINUR NEGATIVE 09/16/2023  1543   BILIRUBINUR Negative 09/04/2023 0812   KETONESUR NEGATIVE 09/16/2023 1543   PROTEINUR NEGATIVE 09/16/2023 1543   NITRITE NEGATIVE 09/16/2023 1543   LEUKOCYTESUR LARGE (A) 09/16/2023 1543   Sepsis Labs Recent Labs  Lab 09/13/23 0927 09/16/23 1113 09/17/23 0248  WBC 9.8 11.7* 9.0   Microbiology Recent Results (from the past 240 hours)  Urine Culture (for pregnant, neutropenic or urologic patients or patients with an indwelling urinary catheter)     Status: Abnormal   Collection Time: 09/16/23  3:43 PM   Specimen: Urine, Clean Catch  Result Value Ref Range Status   Specimen Description   Final    URINE, CLEAN CATCH Performed at [facility], [address] [place], [place] [address]    Special Requests   Final    NONE Performed at [facility], [address] [place], [place] [address]    Culture >=100,000 COLONIES/mL ENTEROCOCCUS FAECALIS (A)  Final   Report Status 09/18/2023 FINAL  Final   Organism ID, Bacteria ENTEROCOCCUS FAECALIS (A)  Final      Susceptibility   Enterococcus faecalis - MIC*    AMPICILLIN <=2 SENSITIVE Sensitive     NITROFURANTOIN <=16 SENSITIVE Sensitive     VANCOMYCIN 1  SENSITIVE Sensitive     * >=100,000 COLONIES/mL ENTEROCOCCUS FAECALIS     Time coordinating discharge: Over 30 minutes  SIGNED:   Charise Killian, MD  [facility] 09/19/2023, 4:03 PM Pager   If 7PM-7AM, please contact night-coverage [email or web address]

## 2023-09-19 NOTE — Telephone Encounter (Signed)
 Please schedule a new patient appointment with a PA in 4-6 weeks. He will need to get xrays that day. Thanks!

## 2023-09-19 NOTE — Plan of Care (Signed)

## 2023-09-19 NOTE — Telephone Encounter (Signed)
 Dr Adriana Simas saw this patient as a consult on 4/2 when he was covering call for our department. Per Dr Adriana Simas: "T7 fracture - has TLSO brace and bringing from home. No surgery that I see and family declined kypho. Should need follow up in clinic in 4-6 weeks."

## 2023-09-20 NOTE — Telephone Encounter (Signed)
 10/30/2023 Ruben Green.

## 2023-09-21 DIAGNOSIS — J449 Chronic obstructive pulmonary disease, unspecified: Secondary | ICD-10-CM | POA: Diagnosis not present

## 2023-09-21 DIAGNOSIS — I5032 Chronic diastolic (congestive) heart failure: Secondary | ICD-10-CM | POA: Diagnosis not present

## 2023-09-23 DIAGNOSIS — H353221 Exudative age-related macular degeneration, left eye, with active choroidal neovascularization: Secondary | ICD-10-CM | POA: Diagnosis not present

## 2023-09-24 ENCOUNTER — Ambulatory Visit: Admitting: Physician Assistant

## 2023-09-24 ENCOUNTER — Inpatient Hospital Stay: Payer: PPO | Attending: Oncology

## 2023-09-24 VITALS — BP 161/69 | HR 78 | Ht 65.0 in | Wt 159.0 lb

## 2023-09-24 DIAGNOSIS — N189 Chronic kidney disease, unspecified: Secondary | ICD-10-CM | POA: Insufficient documentation

## 2023-09-24 DIAGNOSIS — C61 Malignant neoplasm of prostate: Secondary | ICD-10-CM | POA: Diagnosis not present

## 2023-09-24 DIAGNOSIS — R109 Unspecified abdominal pain: Secondary | ICD-10-CM | POA: Diagnosis not present

## 2023-09-24 DIAGNOSIS — I5032 Chronic diastolic (congestive) heart failure: Secondary | ICD-10-CM | POA: Diagnosis not present

## 2023-09-24 DIAGNOSIS — I130 Hypertensive heart and chronic kidney disease with heart failure and stage 1 through stage 4 chronic kidney disease, or unspecified chronic kidney disease: Secondary | ICD-10-CM | POA: Insufficient documentation

## 2023-09-24 DIAGNOSIS — D500 Iron deficiency anemia secondary to blood loss (chronic): Secondary | ICD-10-CM | POA: Insufficient documentation

## 2023-09-24 DIAGNOSIS — R300 Dysuria: Secondary | ICD-10-CM | POA: Diagnosis not present

## 2023-09-24 DIAGNOSIS — N1832 Chronic kidney disease, stage 3b: Secondary | ICD-10-CM | POA: Diagnosis not present

## 2023-09-24 DIAGNOSIS — D090 Carcinoma in situ of bladder: Secondary | ICD-10-CM | POA: Diagnosis not present

## 2023-09-24 DIAGNOSIS — I10 Essential (primary) hypertension: Secondary | ICD-10-CM | POA: Diagnosis not present

## 2023-09-24 DIAGNOSIS — Z9181 History of falling: Secondary | ICD-10-CM | POA: Diagnosis not present

## 2023-09-24 LAB — URINALYSIS, COMPLETE
Bilirubin, UA: NEGATIVE
Ketones, UA: NEGATIVE
Leukocytes,UA: NEGATIVE
Nitrite, UA: NEGATIVE
Protein,UA: NEGATIVE
Specific Gravity, UA: 1.01 (ref 1.005–1.030)
Urobilinogen, Ur: 0.2 mg/dL (ref 0.2–1.0)
pH, UA: 5.5 (ref 5.0–7.5)

## 2023-09-24 LAB — CBC WITH DIFFERENTIAL (CANCER CENTER ONLY)
Abs Immature Granulocytes: 0.09 10*3/uL — ABNORMAL HIGH (ref 0.00–0.07)
Basophils Absolute: 0.1 10*3/uL (ref 0.0–0.1)
Basophils Relative: 1 %
Eosinophils Absolute: 0 10*3/uL (ref 0.0–0.5)
Eosinophils Relative: 0 %
HCT: 38.8 % — ABNORMAL LOW (ref 39.0–52.0)
Hemoglobin: 12.4 g/dL — ABNORMAL LOW (ref 13.0–17.0)
Immature Granulocytes: 1 %
Lymphocytes Relative: 9 %
Lymphs Abs: 0.8 10*3/uL (ref 0.7–4.0)
MCH: 30.5 pg (ref 26.0–34.0)
MCHC: 32 g/dL (ref 30.0–36.0)
MCV: 95.3 fL (ref 80.0–100.0)
Monocytes Absolute: 0.9 10*3/uL (ref 0.1–1.0)
Monocytes Relative: 10 %
Neutro Abs: 7.3 10*3/uL (ref 1.7–7.7)
Neutrophils Relative %: 79 %
Platelet Count: 273 10*3/uL (ref 150–400)
RBC: 4.07 MIL/uL — ABNORMAL LOW (ref 4.22–5.81)
RDW: 12.5 % (ref 11.5–15.5)
WBC Count: 9.1 10*3/uL (ref 4.0–10.5)
nRBC: 0 % (ref 0.0–0.2)

## 2023-09-24 LAB — IRON AND TIBC
Iron: 53 ug/dL (ref 45–182)
Saturation Ratios: 16 % — ABNORMAL LOW (ref 17.9–39.5)
TIBC: 328 ug/dL (ref 250–450)
UIBC: 275 ug/dL

## 2023-09-24 LAB — BLADDER SCAN AMB NON-IMAGING: Scan Result: 322

## 2023-09-24 LAB — FERRITIN: Ferritin: 84 ng/mL (ref 24–336)

## 2023-09-24 LAB — MICROSCOPIC EXAMINATION: Bacteria, UA: NONE SEEN

## 2023-09-24 NOTE — Progress Notes (Signed)
 09/24/2023 4:10 PM   Ruben Green 05-22-1934 [id number]  CC: Chief Complaint  Patient presents with   Abdominal Pain   HPI: Ruben Green is a 89 y.o. male with PMH BPH with incomplete bladder emptying on Flomax and CIS of the bladder s/p induction BCG in 2021 on surveillance who presents today for evaluation of right flank pain.  He is accompanied today by his wife, who contributes to HPI.  He was admitted from 09/16/2023-09/19/2023 with E faecalis UTI, T7 compression fracture after a fall, and acute urinary retention requiring Foley placement in the ED. He passed an inpatient voiding trial prior to discharge. For his infection, he was treated with culture appropriate IV ampicillin and discharged on PO amoxicillin. Notably, admission CT AP with contrast showed right hydroureteronephrosis to the level of the distal ureter in addition to his distended bladder.  Today he reports ongoing right flank pain without fever, chills, nausea, or vomiting. He had similar pain in the hospital that did not resolve with Foley placement. They were told the pain was due to his spine fracture but want to rule out urologic causes. He does not desire a catheter.  In-office UA today positive for 1+ glucose; urine microscopy with 11-30 RBCs/HPF. PVR .  PMH: Past Medical History:  Diagnosis Date   AAA (abdominal aortic aneurysm) (HCC)    a.) s/p repair in 2005   Anemia    Anginal pain (HCC)    Anxiety    Arthritis    B12 deficiency    Bilateral cataracts    a.) s/p BILATERAL extractions in 2018   BPH with obstruction/lower urinary tract symptoms    CAD (coronary artery disease)    Carotid artery stenosis    a.) s/p CEA on the RIGHT   CHF (congestive heart failure) (HCC)    COPD (chronic obstructive pulmonary disease) (HCC)    Diastolic dysfunction 07/29/2019   a.)  TTE 07/29/2019: EF 50-55%; LA mildly enlarged; G1DD.   DOE (dyspnea on exertion)    Elevated PSA    Environmental  and seasonal allergies    History of 2019 novel coronavirus disease (COVID-19)    History of kidney stones    HLD (hyperlipidemia)    HOH (hard of hearing)    HTN (hypertension)    Incomplete bladder emptying    Macular degeneration    Nausea vomiting and diarrhea 09/16/2023   Prostate cancer (HCC)    RBBB (right bundle branch block)    Valvular insufficiency    a.) TTE 07/29/2019: LVEF 50-55%; LA mild enlarged; triv AR/PR, mild MR, mod TR.    Surgical History: Past Surgical History:  Procedure Laterality Date   ABDOMINAL AORTIC ANEURYSM REPAIR     CATARACT EXTRACTION W/PHACO Right 09/25/2016   Procedure: CATARACT EXTRACTION PHACO AND INTRAOCULAR LENS PLACEMENT (IOC);  Surgeon: Galen Manila, MD;  Location: [facility] ORS;  Service: Ophthalmology;  Laterality: Right;  [place] 01:01 AP% 17.4 CDE 10.75 Fluid pack lot # [id number]   CATARACT EXTRACTION W/PHACO Left 10/16/2016   Procedure: CATARACT EXTRACTION PHACO AND INTRAOCULAR LENS PLACEMENT (IOC) Suture placed in Left eye;  Surgeon: Galen Manila, MD;  Location: [facility];  Service: Ophthalmology;  Laterality: Left;  [place] 2:06.8 AP% 22.2 CDE 28.17 Fluid pack lot # [id number]   COLONOSCOPY WITH PROPOFOL N/A 03/31/2019   Procedure: COLONOSCOPY WITH PROPOFOL;  Surgeon: Christena Deem, MD;  Location: [facility] ENDOSCOPY;  Service: Endoscopy;  Laterality: N/A;   COLONOSCOPY WITH PROPOFOL N/A 09/09/2020   Procedure:  COLONOSCOPY WITH PROPOFOL;  Surgeon: Regis Bill, MD;  Location: [facility] ENDOSCOPY;  Service: Endoscopy;  Laterality: N/A;   CYSTOSCOPY W/ RETROGRADES Bilateral 02/23/2020   Procedure: CYSTOSCOPY WITH RETROGRADE PYELOGRAM;  Surgeon: Riki Altes, MD;  Location: [facility];  Service: Urology;  Laterality: Bilateral;   CYSTOSCOPY WITH BIOPSY N/A 02/23/2020   Procedure: CYSTOSCOPY WITH BIOPSY;  Surgeon: Riki Altes, MD;  Location: [facility] OR[facility];  Service: Urology;  Laterality: N/A;   EYE SURGERY     HERNIA REPAIR     x 2   INGUINAL  HERNIA REPAIR Right 12/02/2015   Procedure: HERNIA REPAIR INGUINAL ADULT;  Surgeon: Nadeen Landau, MD;  Location: [facility] ORS;  Service: General;  Laterality: Right;   PROSTATE SURGERY  2012   Right Carotid Endarterectomy     TOTAL HIP ARTHROPLASTY Right 08/01/2021   Procedure: TOTAL HIP ARTHROPLASTY;  Surgeon: Christena Flake, MD;  Location: [facility] ORS;  Service: Orthopedics;  Laterality: Right;    Home Medications:  Allergies as of 09/24/2023       Reactions   Oxycodone Other (See Comments)   Caused hallucinations/does not want anymore        Medication List        Accurate as of September 24, 2023  4:10 PM. If you have any questions, ask your nurse or doctor.          STOP taking these medications    aspirin EC 81 MG tablet Stopped by: Carman Ching   lidocaine 5 % Commonly known as: Lidoderm Stopped by: Carman Ching   torsemide 20 MG tablet Commonly known as: DEMADEX Stopped by: Carman Ching       TAKE these medications    acetaminophen 500 MG tablet Commonly known as: TYLENOL Take 1-2 tablets (500-1,000 mg total) by mouth every 6 (six) hours as needed for mild pain.   albuterol 108 (90 Base) MCG/ACT inhaler Commonly known as: VENTOLIN HFA Inhale 2 puffs into the lungs every 4 (four) hours as needed.   amoxicillin 500 MG tablet Commonly known as: AMOXIL Take 1 tablet (500 mg total) by mouth 2 (two) times daily for 5 days.   atorvastatin 40 MG tablet Commonly known as: LIPITOR Take 40 mg by mouth daily.   empagliflozin 10 MG Tabs tablet Commonly known as: Jardiance Take 1 tablet (10 mg total) by mouth daily before breakfast.   iron polysaccharides 150 MG capsule Commonly known as: NIFEREX Take 150 mg by mouth 2 (two) times daily.   LORazepam 0.5 MG tablet Commonly known as: ATIVAN Take 0.5 mg by mouth daily as needed for anxiety.   methocarbamol 500 MG tablet Commonly known as: ROBAXIN Take 1 tablet (500 mg total) by mouth  every 8 (eight) hours as needed.   metoprolol succinate 50 MG 24 hr tablet Commonly known as: TOPROL-XL Take 1 tablet (50 mg total) by mouth daily. Take with or immediately following a meal.   omeprazole 20 MG capsule Commonly known as: PRILOSEC Take 20 mg by mouth daily.   tamsulosin 0.4 MG Caps capsule Commonly known as: FLOMAX Take 0.4 mg by mouth daily.   traMADol 50 MG tablet Commonly known as: ULTRAM Take 1 tablet (50 mg total) by mouth every 4 (four) hours as needed for moderate pain (pain score 4-6) or severe pain (pain score 7-10).   Trelegy Ellipta 100-62.5-25 MCG/INH Aepb Generic drug: Fluticasone-Umeclidin-Vilant Inhale 2 puffs into the lungs daily.        Allergies:  Allergies  Allergen Reactions   Oxycodone Other (See Comments)    Caused hallucinations/does not want anymore    Family History: Family History  Problem Relation Age of Onset   Cancer Brother        2000   Prostate cancer Neg Hx    Bladder Cancer Neg Hx    Kidney cancer Neg Hx     Social History:   reports that he quit smoking about 24 years ago. His smoking use included cigarettes. He has quit using smokeless tobacco. He reports that he does not currently use alcohol. He reports that he does not use drugs.  Physical Exam: BP (!) 161/69   Pulse 78   Ht 5\' 5"  (1.651 m)   Wt 159 lb (72.1 kg)   BMI 26.46 kg/m   Constitutional:  Alert and oriented, no acute distress, nontoxic appearing HEENT: [place], AT Cardiovascular: No clubbing, cyanosis, or edema Respiratory: Normal respiratory effort, no increased work of breathing Skin: No rashes, bruises or suspicious lesions Neurologic: Grossly intact, no focal deficits, moving all 4 extremities Psychiatric: Normal mood and affect  Laboratory Data: Results for orders placed or performed in visit on 09/24/23  Microscopic Examination   Collection Time: 09/24/23  3:46 PM   Urine  Result Value Ref Range   WBC, UA 0-5 0 - 5 /hpf   RBC, Urine  11-30 (A) 0 - 2 /hpf   Epithelial Cells (non renal) 0-10 0 - 10 /hpf   Bacteria, UA None seen None seen/Few  Urinalysis, Complete   Collection Time: 09/24/23  3:46 PM  Result Value Ref Range   Specific Gravity, UA 1.010 1.005 - 1.030   pH, UA 5.5 5.0 - 7.5   Color, UA Yellow Yellow   Appearance Ur Clear Clear   Leukocytes,UA Negative Negative   Protein,UA Negative Negative/Trace   Glucose, UA 1+ (A) Negative   Ketones, UA Negative Negative   RBC, UA 3+ (A) Negative   Bilirubin, UA Negative Negative   Urobilinogen, Ur 0.2 0.2 - 1.0 mg/dL   Nitrite, UA Negative Negative   Microscopic Examination See below:   Bladder Scan (Post Void Residual) in office   Collection Time: 09/24/23  3:51 PM  Result Value Ref Range   Scan Result 322    Assessment & Plan:   1. Right flank pain (Primary) In the setting of recent urinary retention with right hydronephrosis and T7 compression fracture.  Bladder scan is elevated today, though improved over ED triage.  He declines Foley catheter placement or INO catheterization at this time.  UA today with microscopic hematuria, not unusual with his history of CIS and recent UTI treatment.  Will obtain renal ultrasound to assess for persistent right hydronephrosis.  If his hydronephrosis has resolved, we discussed that his pain is likely secondary to a spinal fracture.  They are in agreement with this plan. - Urinalysis, Complete - Bladder Scan (Post Void Residual) in office - US RENAL; Future   Return for Will call with results.  Carman Ching, PA-C  [facility] [place] [address], [address] [place], [place] [address] [phone]

## 2023-09-25 NOTE — Progress Notes (Signed)
 [facility] Liaison Note  09/25/2023  Ruben Green 07-30-1933 [id number]  Location: RN Hospital Liaison screened the patient remotely at [facility].  Insurance: [employer]   Ruben Green is a 89 y.o. male who is a Primary Care Patient of Hande, Roderic Palau, MD The patient was screened for  day readmission hospitalization with noted extreme risk score for unplanned readmission risk with 1 IP/2 ED in 6 months.  The patient was assessed for potential Care Management service needs for post hospital transition for care coordination. Review of patient's electronic medical record reveals patient was admitted for Hyponatremia. Pt discharged with no anticipated needs. This provider will completed the TOC follow up call. Pt also listed with [facility] HF clinic.   [facility] Care Management/Population Health does not replace or interfere with any arrangements made by the Inpatient Transition of Care team.   For questions contact:   Elliot Cousin, RN, BSN Hospital Liaison [place]   [facility], Population Health Office Hours MTWF  8:00 am-6:00 pm Direct Dial: [phone] [email or web address]

## 2023-09-26 ENCOUNTER — Encounter: Payer: Self-pay | Admitting: Oncology

## 2023-09-26 ENCOUNTER — Inpatient Hospital Stay (HOSPITAL_BASED_OUTPATIENT_CLINIC_OR_DEPARTMENT_OTHER): Payer: PPO | Admitting: Oncology

## 2023-09-26 ENCOUNTER — Inpatient Hospital Stay: Payer: PPO

## 2023-09-26 VITALS — BP 132/67 | HR 73 | Temp 96.8°F | Resp 18 | Wt 161.2 lb

## 2023-09-26 DIAGNOSIS — D500 Iron deficiency anemia secondary to blood loss (chronic): Secondary | ICD-10-CM

## 2023-09-26 DIAGNOSIS — D631 Anemia in chronic kidney disease: Secondary | ICD-10-CM

## 2023-09-26 DIAGNOSIS — C61 Malignant neoplasm of prostate: Secondary | ICD-10-CM

## 2023-09-26 DIAGNOSIS — N189 Chronic kidney disease, unspecified: Secondary | ICD-10-CM

## 2023-09-26 MED ORDER — IRON SUCROSE 20 MG/ML IV SOLN
200.0000 mg | Freq: Once | INTRAVENOUS | Status: AC
  Administered 2023-09-26: 200 mg via INTRAVENOUS
  Filled 2023-09-26: qty 10

## 2023-09-26 NOTE — Assessment & Plan Note (Signed)
Previously treated with radiation.   Recommend patient to continue follow-up with urology and radiation oncology.

## 2023-09-26 NOTE — Progress Notes (Signed)
 Hematology/Oncology Consult note [phone] [phone]    CHIEF COMPLAINTS/REASON FOR VISIT:  Iron deficiency Anemia  ASSESSMENT & PLAN:  Iron deficiency anemia due to chronic blood loss He tolerates IV venofer treatments.  Lab Results  Component Value Date   HGB 12.4 (L) 09/24/2023   TIBC 328 09/24/2023   IRONPCTSAT 16 (L) 09/24/2023   FERRITIN 84 09/24/2023    Hemoglobin is stable Recommend IV Venofer 200mg  x 2  Anemia due to chronic kidney disease negative multiple myeloma panel, light chain ratio. Venofer to further increase iron store.   Prostate cancer [facility]) Previously treated with radiation.  Recommend patient to continue follow-up with urology and radiation oncology.  Patient declines [place] interpreter service  Orders Placed This Encounter  Procedures   CBC with Differential (Cancer Center Only)    Standing Status:   Future    Expected Date:   03/27/2024    Expiration Date:   09/25/2024   Iron and TIBC    Standing Status:   Future    Expected Date:   03/27/2024    Expiration Date:   09/25/2024   Ferritin    Standing Status:   Future    Expected Date:   03/27/2024    Expiration Date:   09/25/2024   Retic Panel    Standing Status:   Future    Expected Date:   03/27/2024    Expiration Date:   09/25/2024    Follow-up in 6 months All questions were answered. The patient knows to call the clinic with any problems, questions or concerns.  [name], MD, PhD [facility] Hematology Oncology 09/26/2023     HISTORY OF PRESENTING ILLNESS:  Ruben Green is a  89 y.o.  male with PMH listed below who was referred to me for anemia Reviewed patient's recent labs that was done.  He was found to have abnormal CBC on 01/16/2022, hemoglobin 8.8, MCV 88.3, Reviewed patient's previous labs ordered by primary care physician's office, anemia is chronic onset, gradually worsening.  Patient has chronic kidney disease. He denies recent chest pain on  exertion,  pre-syncopal episodes, or palpitations He had not noticed any recent bleeding such as epistaxis, hematuria  He denies over the counter NSAID ingestion. His last colonoscopy was  He denies any pica and eats a variety of diet. Chronic shortness of breath with exertion due to COPD. Patient was accompanied by wife.   Patient has history of prostate cancer that was treated with radiation in 2017 by Dr. Rushie Chestnut.  Patient has had proctitis and intermittently he has blood in the stool.  INTERVAL HISTORY Ruben Green is a 89 y.o. male who has above history reviewed by me today presents for follow up visit for iron deficiency anemia Patient speaks [place].  He had a hard hearing.  Accompanied by daughter Patient declines [place] interpreter service No new complaints.  MEDICAL HISTORY:  Past Medical History:  Diagnosis Date   AAA (abdominal aortic aneurysm) (HCC)    a.) s/p repair in 2005   Anemia    Anginal pain (HCC)    Anxiety    Arthritis    B12 deficiency    Bilateral cataracts    a.) s/p BILATERAL extractions in 2018   BPH with obstruction/lower urinary tract symptoms    CAD (coronary artery disease)    Carotid artery stenosis    a.) s/p CEA on the RIGHT   CHF (congestive heart failure) (HCC)    COPD (chronic obstructive pulmonary disease) (  HCC)    Diastolic dysfunction 07/29/2019   a.)  TTE 07/29/2019: EF 50-55%; LA mildly enlarged; G1DD.   DOE (dyspnea on exertion)    Elevated PSA    Environmental and seasonal allergies    History of 2019 novel coronavirus disease (COVID-19)    History of kidney stones    HLD (hyperlipidemia)    HOH (hard of hearing)    HTN (hypertension)    Incomplete bladder emptying    Macular degeneration    Nausea vomiting and diarrhea 09/16/2023   Prostate cancer (HCC)    RBBB (right bundle branch block)    Valvular insufficiency    a.) TTE 07/29/2019: LVEF 50-55%; LA mild enlarged; triv AR/PR, mild MR, mod TR.    SURGICAL  HISTORY: Past Surgical History:  Procedure Laterality Date   ABDOMINAL AORTIC ANEURYSM REPAIR     CATARACT EXTRACTION W/PHACO Right 09/25/2016   Procedure: CATARACT EXTRACTION PHACO AND INTRAOCULAR LENS PLACEMENT (IOC);  Surgeon: Galen Manila, MD;  Location: [facility] ORS;  Service: Ophthalmology;  Laterality: Right;  [place] 01:01 AP% 17.4 CDE 10.75 Fluid pack lot # [id number]   CATARACT EXTRACTION W/PHACO Left 10/16/2016   Procedure: CATARACT EXTRACTION PHACO AND INTRAOCULAR LENS PLACEMENT (IOC) Suture placed in Left eye;  Surgeon: Galen Manila, MD;  Location: [facility] ORS;  Service: Ophthalmology;  Laterality: Left;  [place] 2:06.8 AP% 22.2 CDE 28.17 Fluid pack lot # [id number]   COLONOSCOPY WITH PROPOFOL N/A 03/31/2019   Procedure: COLONOSCOPY WITH PROPOFOL;  Surgeon: Christena Deem, MD;  Location: [facility] ENDOSCOPY;  Service: Endoscopy;  Laterality: N/A;   COLONOSCOPY WITH PROPOFOL N/A 09/09/2020   Procedure: COLONOSCOPY WITH PROPOFOL;  Surgeon: Regis Bill, MD;  Location: [facility] ENDOSCOPY;  Service: Endoscopy;  Laterality: N/A;   CYSTOSCOPY W/ RETROGRADES Bilateral 02/23/2020   Procedure: CYSTOSCOPY WITH RETROGRADE PYELOGRAM;  Surgeon: Riki Altes, MD;  Location: [facility] ORS;  Service: Urology;  Laterality: Bilateral;   CYSTOSCOPY WITH BIOPSY N/A 02/23/2020   Procedure: CYSTOSCOPY WITH BIOPSY;  Surgeon: Riki Altes, MD;  Location: [facility] ORS;  Service: Urology;  Laterality: N/A;   EYE SURGERY     HERNIA REPAIR     x 2   INGUINAL HERNIA REPAIR Right 12/02/2015   Procedure: HERNIA REPAIR INGUINAL ADULT;  Surgeon: Nadeen Landau, MD;  Location: [facility] ORS;  Service: General;  Laterality: Right;   PROSTATE SURGERY  2012   Right Carotid Endarterectomy     TOTAL HIP ARTHROPLASTY Right 08/01/2021   Procedure: TOTAL HIP ARTHROPLASTY;  Surgeon: Christena Flake, MD;  Location: [facility] ORS;  Service: Orthopedics;  Laterality: Right;    SOCIAL HISTORY: Social History   Socioeconomic History   Marital  status: Married    Spouse name: Not on file   Number of children: Not on file   Years of education: Not on file   Highest education level: Not on file  Occupational History   Not on file  Tobacco Use   Smoking status: Former    Current packs/day: 0.00    Types: Cigarettes    Quit date: 11/30/1998    Years since quitting: 24.8   Smokeless tobacco: Former   Tobacco comments:    quit 10 years ago  Vaping Use   Vaping status: Never Used  Substance and Sexual Activity   Alcohol use: Not Currently    Comment: occasional   Drug use: No   Sexual activity: Not on file  Other Topics Concern   Not on file  Social History  Narrative   Not on file   Social Drivers of Health   Financial Resource Strain: Low Risk  (07/22/2023)   Received from [facility]   Overall Financial Resource Strain (CARDIA)    Difficulty of Paying Living Expenses: Not hard at all  Food Insecurity: No Food Insecurity (09/17/2023)   Hunger Vital Sign    Worried About Running Out of Food in the Last Year: Never true    Ran Out of Food in the Last Year: Never true  Transportation Needs: No Transportation Needs (09/17/2023)   PRAPARE - Administrator, Civil Service (Medical): No    Lack of Transportation (Non-Medical): No  Physical Activity: Unknown (04/23/2017)   [facility], [facility]   Exercise Vital Sign    Days of Exercise per Week: Patient declined    Minutes of Exercise per Session: Patient declined  Stress: Unknown (04/23/2017)   Received from [facility], [facility]   [employer] of Occupational Health - Occupational Stress Questionnaire    Feeling of Stress : Patient declined  Social Connections: Unknown (09/17/2023)   Social Connection and Isolation Panel [NHANES]    Frequency of Communication with Friends and Family: More than three times a week    Frequency of Social Gatherings with  Friends and Family: More than three times a week    Attends Religious Services: Patient declined    Active Member of Clubs or Organizations: Patient declined    Attends Banker Meetings: Patient declined    Marital Status: Married  Catering manager Violence: Not At Risk (09/17/2023)   Humiliation, Afraid, Rape, and Kick questionnaire    Fear of Current or Ex-Partner: No    Emotionally Abused: No    Physically Abused: No    Sexually Abused: No    FAMILY HISTORY: Family History  Problem Relation Age of Onset   Cancer Brother        2000   Prostate cancer Neg Hx    Bladder Cancer Neg Hx    Kidney cancer Neg Hx     ALLERGIES:  is allergic to oxycodone.  MEDICATIONS:  Current Outpatient Medications  Medication Sig Dispense Refill   acetaminophen (TYLENOL) 500 MG tablet Take 1-2 tablets (500-1,000 mg total) by mouth every 6 (six) hours as needed for mild pain. 60 tablet 0   albuterol (VENTOLIN HFA) 108 (90 Base) MCG/ACT inhaler Inhale 2 puffs into the lungs every 4 (four) hours as needed. 8 g 0   atorvastatin (LIPITOR) 40 MG tablet Take 40 mg by mouth daily.     empagliflozin (JARDIANCE) 10 MG TABS tablet Take 1 tablet (10 mg total) by mouth daily before breakfast. 30 tablet 11   Fluticasone-Umeclidin-Vilant (TRELEGY ELLIPTA) 100-62.5-25 MCG/INH AEPB Inhale 2 puffs into the lungs daily.     iron polysaccharides (NIFEREX) 150 MG capsule Take 150 mg by mouth 2 (two) times daily.     LORazepam (ATIVAN) 0.5 MG tablet Take 0.5 mg by mouth daily as needed for anxiety.     methocarbamol (ROBAXIN) 500 MG tablet Take 1 tablet (500 mg total) by mouth every 8 (eight) hours as needed. 30 tablet 0   tamsulosin (FLOMAX) 0.4 MG CAPS capsule Take 0.4 mg by mouth daily.     traMADol (ULTRAM) 50 MG tablet Take 1 tablet (50 mg total) by mouth every 4 (four) hours as needed for moderate pain (pain score 4-6) or severe pain (pain  score 7-10). 30 tablet 0   No current facility-administered  medications for this visit.    Review of Systems  Constitutional:  Positive for fatigue. Negative for chills and fever.  HENT:   Positive for hearing loss. Negative for voice change.   Eyes:  Negative for eye problems and icterus.  Respiratory:  Positive for shortness of breath. Negative for chest tightness and cough.   Cardiovascular:  Negative for chest pain and leg swelling.  Gastrointestinal:  Negative for abdominal distention and abdominal pain.  Endocrine: Negative for hot flashes.  Genitourinary:  Negative for difficulty urinating, dysuria and frequency.   Musculoskeletal:  Negative for arthralgias.  Skin:  Negative for itching and rash.  Neurological:  Negative for light-headedness and numbness.  Hematological:  Negative for adenopathy. Does not bruise/bleed easily.  Psychiatric/Behavioral:  Negative for confusion.     PHYSICAL EXAMINATION: ECOG PERFORMANCE STATUS: 1 - Symptomatic but completely ambulatory Vitals:   09/26/23 1446  BP: 132/67  Pulse: 73  Resp: 18  Temp: (!) 96.8 F (36 C)  SpO2: 100%   Filed Weights   09/26/23 1446  Weight: 161 lb 3.2 oz (73.1 kg)    Physical Exam Constitutional:      General: He is not in acute distress. HENT:     Head: Normocephalic and atraumatic.  Eyes:     General: No scleral icterus. Cardiovascular:     Rate and Rhythm: Normal rate and regular rhythm.     Heart sounds: Normal heart sounds.  Pulmonary:     Effort: Pulmonary effort is normal. No respiratory distress.     Breath sounds: No wheezing.     Comments: Significantly decreased breath sound bilaterally Abdominal:     General: Bowel sounds are normal. There is no distension.     Palpations: Abdomen is soft.  Musculoskeletal:        General: No deformity. Normal range of motion.     Cervical back: Normal range of motion and neck supple.  Skin:    General: Skin is warm and dry.     Findings: No erythema or rash.  Neurological:     Mental Status: He is alert  and oriented to person, place, and time. Mental status is at baseline.     Cranial Nerves: No cranial nerve deficit.     Coordination: Coordination normal.  Psychiatric:        Mood and Affect: Mood normal.      LABORATORY DATA:  I have reviewed the data as listed    Latest Ref Rng & Units 09/24/2023    3:09 PM 09/17/2023    2:48 AM 09/16/2023   11:13 AM  CBC  WBC 4.0 - 10.5 K/uL 9.1  9.0  11.7   Hemoglobin 13.0 - 17.0 g/dL 40.9  81.1  91.4   Hematocrit 39.0 - 52.0 % 38.8  35.1  38.0   Platelets 150 - 400 K/uL 273  238  301       Latest Ref Rng & Units 09/19/2023    3:48 AM 09/18/2023    4:07 AM 09/17/2023    2:48 AM  CMP  Glucose 70 - 99 mg/dL 782  956  213   BUN 8 - 23 mg/dL 45  49  43   Creatinine 0.61 - 1.24 mg/dL 0.86  5.78  4.69   Sodium 135 - 145 mmol/L 129  124  125   Potassium 3.5 - 5.1 mmol/L 3.8  3.8  3.6   Chloride 98 -  111 mmol/L 95  89  91   CO2 22 - 32 mmol/L 24  24  22    Calcium 8.9 - 10.3 mg/dL 8.3  7.9  8.5       Component Value Date/Time   IRON 53 09/24/2023 1509   TIBC 328 09/24/2023 1509   FERRITIN 84 09/24/2023 1509   IRONPCTSAT 16 (L) 09/24/2023 1509     RADIOGRAPHIC STUDIES: I have personally reviewed the radiological images as listed and agreed with the findings in the report. MR THORACIC SPINE W WO CONTRAST Result Date: 09/18/2023 CLINICAL DATA:  Mid back pain with compression fracture EXAM: MRI THORACIC WITHOUT AND WITH CONTRAST TECHNIQUE: Multiplanar and multiecho pulse sequences of the thoracic spine were obtained without and with intravenous contrast. CONTRAST:  8mL GADAVIST GADOBUTROL 1 MMOL/ML IV SOLN COMPARISON:  CTA chest 09/13/2023 FINDINGS: Alignment:  Physiologic. Vertebrae: T7 burst fracture with approximately 50% stenosis and 3 mm retropulsion. Moderate bone marrow edema. No other osseous abnormality. Cord:  Normal signal and morphology. Paraspinal and other soft tissues: Negative. Disc levels: No spinal canal stenosis. IMPRESSION: Acute T7  burst fracture with approximately 50% stenosis and 3 mm retropulsion. Electronically Signed   By: Deatra Robinson M.D.   On: 09/18/2023 00:25   CT ABDOMEN PELVIS W CONTRAST Result Date: 09/16/2023 CLINICAL DATA:  Right flank pain.  Nausea and vomiting. EXAM: CT ABDOMEN AND PELVIS WITH CONTRAST TECHNIQUE: Multidetector CT imaging of the abdomen and pelvis was performed using the standard protocol following bolus administration of intravenous contrast. RADIATION DOSE REDUCTION: This exam was performed according to the departmental dose-optimization program which includes automated exposure control, adjustment of the mA and/or kV according to patient size and/or use of iterative reconstruction technique. CONTRAST:  OMNIPAQUE IOHEXOL 300 MG/ML  SOLN COMPARISON:  CT dated 09/13/2023. FINDINGS: Lower chest: Bibasilar atelectasis/scarring. No intra-abdominal free air or free fluid. Hepatobiliary: The liver is unremarkable. No biliary dilatation. The gallbladder is unremarkable Pancreas: Unremarkable. No pancreatic ductal dilatation or surrounding inflammatory changes. Spleen: Normal in size without focal abnormality. Adrenals/Urinary Tract: The adrenal glands are unremarkable. Mild bilateral renal parenchyma atrophy. Small bilateral renal cysts. 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 There is interval development of mild right hydronephrosis and hydroureter to the level of distal ureter were the duplicated ureters join. No obstructing stone. No hydronephrosis on the left. The left ureter is unremarkable. The urinary bladder is distended. There is a small left Hutch diverticula. Stomach/Bowel: Severe sigmoid diverticulosis. There is midline ventral supraumbilical hernia containing a short segment of the transverse colon. The neck of the hernia defect measures approximately 4.5 cm in transverse axial diameter. There is no bowel obstruction or active inflammation. There is a  moderate size hiatal hernia containing a portion of the stomach. The appendix is normal. Vascular/Lymphatic: Advanced atherosclerotic calcification of the abdominal aorta. An aortobifemoral bypass graft noted which appears patent. The IVC is unremarkable. No portal venous gas. There is no adenopathy. Reproductive: The prostate and seminal vesicles are grossly unremarkable. Other: Small fat containing left inguinal hernia as well as a fat containing umbilical hernia. The neck of the fat containing umbilical hernia is approximately 5 cm in transverse axial diameter. Musculoskeletal: Total right hip arthroplasty. Osteopenia with degenerative changes spine. No acute osseous pathology. IMPRESSION: 1. Interval development of mild right hydronephrosis and hydroureter to the level of distal ureter at the junction of the duplicated ureters. No obstructing stone. 2. Severe sigmoid diverticulosis. No bowel obstruction. Normal appendix.  3. Midline ventral supraumbilical hernia containing a short segment of the transverse colon. No bowel obstruction. 4. Moderate size hiatal hernia containing a portion of the stomach. Electronically Signed   By: Elgie Collard M.D.   On: 09/16/2023 16:55   CT Angio Chest/Abd/Pel for Dissection W and/or Wo Contrast Result Date: 09/13/2023 CLINICAL DATA:  back/abdominal pain, hx of aneurysm. EXAM: CT ANGIOGRAPHY CHEST, ABDOMEN AND PELVIS TECHNIQUE: Non-contrast CT of the chest was initially obtained. Multidetector CT imaging through the chest, abdomen and pelvis was performed using the standard protocol during bolus administration of intravenous contrast. Multiplanar reconstructed images and MIPs were obtained and reviewed to evaluate the vascular anatomy. RADIATION DOSE REDUCTION: This exam was performed according to the departmental dose-optimization program which includes automated exposure control, adjustment of the mA and/or kV according to patient size and/or use of iterative  reconstruction technique. CONTRAST:  75mL OMNIPAQUE IOHEXOL 350 MG/ML SOLN COMPARISON:  CT scan chest from 12/25/2021 and CT scan abdomen and pelvis from 12/06/2022. FINDINGS: CTA CHEST FINDINGS Cardiovascular: No intramural hematoma noted in the thoracic aorta on the unenhanced images. Thoracic aorta is normal in caliber without aneurysm, dissection, vasculitis or significant stenosis. Normal cardiac size. No pericardial effusion. No aortic aneurysm. There are coronary artery calcifications, in keeping with coronary artery disease. There are also moderate to severe peripheral atherosclerotic vascular calcifications of thoracic aorta and its major branches. Mediastinum/Nodes: Visualized thyroid gland appears grossly unremarkable. No solid / cystic mediastinal masses. The esophagus is nondistended precluding optimal assessment. No axillary, mediastinal or hilar lymphadenopathy by size criteria. Lungs/Pleura: The central tracheo-bronchial tree is patent. There are moderate emphysematous changes with upper lobe predominance. There are patchy areas of linear, plate-like atelectasis and/or scarring throughout bilateral lungs. No mass or consolidation. No pleural effusion or pneumothorax. No suspicious lung nodules. Musculoskeletal: The visualized soft tissues of the chest wall are grossly unremarkable. No suspicious osseous lesions. There is diffuse osteopenia of the visualized osseous structures. There are mild to moderate multilevel degenerative changes in the visualized spine. There is moderate compression deformity of T7 vertebral body, of indeterminate age, but new since the prior study from 12/25/2021. No significant retropulsion or spinal canal compromise. There are subacute/healing fractures of anteromedial right fifth through seventh ribs. There also subacute/healing fractures of left anteromedial sixth through eighth ribs. Review of the MIP images confirms the above findings. CTA ABDOMEN AND PELVIS FINDINGS  VASCULAR Aorta: Normal caliber abdominal aorta.  Aorto bi-iliac graft noted. Celiac: There is dense calcified plaque at the origin of the celiac trunk. The artery is otherwise patent without evidence of aneurysm, dissection or vasculitis. SMA: Patent without evidence of aneurysm, dissection, vasculitis or significant stenosis. Renals: Both renal arteries are patent without evidence of aneurysm, dissection, vasculitis, fibromuscular dysplasia or significant stenosis. IMA: Patent without evidence of aneurysm, dissection, vasculitis or significant stenosis. Inflow: Aorto bi-iliac graft extends into the left external iliac artery as well, which is patent. There is complete occlusion of stented left internal iliac artery. The remaining arteries are patent without evidence of aneurysm, dissection, vasculitis or significant stenosis. Veins: No obvious venous abnormality within the limitations of this arterial phase study. Review of the MIP images confirms the above findings. NON-VASCULAR Hepatobiliary: The liver is normal in size. Non-cirrhotic configuration. No suspicious mass. No intrahepatic or extrahepatic bile duct dilation. No calcified gallstones. Normal gallbladder wall thickness. No pericholecystic inflammatory changes. Pancreas: Unremarkable. No pancreatic ductal dilatation or surrounding inflammatory changes. Spleen: Within normal limits. No focal lesion. Adrenals/Urinary Tract: Adrenal glands  are unremarkable. No suspicious renal mass. Small/atrophic left kidney noted. There are multiple cysts throughout bilateral kidneys with largest in the right kidney interpolar region measuring up to 2.5 x 3.0 cm. No hydroureteronephrosis or nephroureterolithiasis. Evaluation of urinary bladder is markedly limited due to extensive streak artifacts from right hip arthroplasty. Stomach/Bowel: There is moderate sized sliding hiatal hernia containing proximal half of the stomach. No disproportionate dilation of the small or  large bowel loops. No evidence of abnormal bowel wall thickening or inflammatory changes. The appendix is unremarkable. There are innumerable diverticula mainly in the left hemi colon, without imaging signs of diverticulitis. Vascular/Lymphatic: No ascites or pneumoperitoneum. No abdominal or pelvic lymphadenopathy, by size criteria. Reproductive: Markedly limited evaluation of prostate gland due to extensive streak artifacts from right hip arthroplasty. Other: There is midline supraumbilical ventral hernia containing fat and small portion of wall of nonobstructed transverse colon. There is small to moderate fat containing periumbilical hernia and a small fat containing left inguinal hernia. The soft tissues and abdominal wall are otherwise unremarkable. Musculoskeletal: No suspicious osseous lesions. There are mild multilevel degenerative changes in the visualized spine. There is diffuse osteopenia of the visualized osseous structures. Right hip arthroplasty noted. Review of the MIP images confirms the above findings. IMPRESSION: 1. No acute aortic syndrome. No thoracic acute intramural hematoma. No thoracic or abdominal aortic aneurysm, dissection or penetrating atherosclerotic ulcer. 2. There is moderate compression deformity of T7 vertebral body, of indeterminate age, but new since the prior study from 12/25/2021. No significant retropulsion or spinal canal compromise. 3. There are subacute/healing fractures of anteromedial right fifth through seventh ribs and left anteromedial sixth through eighth ribs. 4. Multiple other nonacute observations, as described above. Aortic Atherosclerosis (ICD10-I70.0) and Emphysema (ICD10-J43.9). Electronically Signed   By: Jules Schick M.D.   On: 09/13/2023 12:03   DG Chest Port 1 View Result Date: 09/10/2023 CLINICAL DATA:  Neck pain.  Right flank pain. EXAM: PORTABLE CHEST 1 VIEW COMPARISON:  03/24/2023. FINDINGS: Low lung volume. Bilateral lung fields are clear.  Bilateral costophrenic angles are clear. Note is made of elevated left hemidiaphragm. Stable cardio-mediastinal silhouette. No acute osseous abnormalities. The soft tissues are within normal limits. IMPRESSION: No active disease. Electronically Signed   By: Jules Schick M.D.   On: 09/10/2023 11:38

## 2023-09-26 NOTE — Assessment & Plan Note (Signed)
negative multiple myeloma panel, light chain ratio. Venofer to further increase iron store.

## 2023-09-26 NOTE — Assessment & Plan Note (Addendum)
 He tolerates IV venofer treatments.  Lab Results  Component Value Date   HGB 12.4 (L) 09/24/2023   TIBC 328 09/24/2023   IRONPCTSAT 16 (L) 09/24/2023   FERRITIN 84 09/24/2023    Hemoglobin is stable Recommend IV Venofer 200mg  x 2

## 2023-09-27 ENCOUNTER — Ambulatory Visit: Admitting: Physician Assistant

## 2023-09-30 ENCOUNTER — Ambulatory Visit
Admission: RE | Admit: 2023-09-30 | Discharge: 2023-09-30 | Disposition: A | Discharge status: Home / Self Care | Source: Ambulatory Visit | Attending: Physician Assistant | Admitting: Physician Assistant

## 2023-09-30 DIAGNOSIS — R109 Unspecified abdominal pain: Secondary | ICD-10-CM | POA: Insufficient documentation

## 2023-09-30 DIAGNOSIS — N281 Cyst of kidney, acquired: Secondary | ICD-10-CM | POA: Diagnosis not present

## 2023-10-03 ENCOUNTER — Inpatient Hospital Stay

## 2023-10-03 VITALS — BP 115/61 | HR 77 | Temp 98.0°F | Resp 16

## 2023-10-03 DIAGNOSIS — D500 Iron deficiency anemia secondary to blood loss (chronic): Secondary | ICD-10-CM | POA: Diagnosis not present

## 2023-10-03 MED ORDER — IRON SUCROSE 20 MG/ML IV SOLN
200.0000 mg | Freq: Once | INTRAVENOUS | Status: AC
  Administered 2023-10-03: 200 mg via INTRAVENOUS
  Filled 2023-10-03: qty 10

## 2023-10-03 NOTE — Patient Instructions (Signed)

## 2023-10-03 NOTE — Progress Notes (Signed)
 Declined post-observation. Aware of risks. Vitals stable at discharge.

## 2023-10-07 DIAGNOSIS — I491 Atrial premature depolarization: Secondary | ICD-10-CM | POA: Diagnosis not present

## 2023-10-14 ENCOUNTER — Other Ambulatory Visit
Admission: RE | Admit: 2023-10-14 | Discharge: 2023-10-14 | Disposition: A | Discharge status: Home / Self Care | Source: Ambulatory Visit | Attending: Nurse Practitioner | Admitting: Nurse Practitioner

## 2023-10-14 DIAGNOSIS — E782 Mixed hyperlipidemia: Secondary | ICD-10-CM | POA: Diagnosis not present

## 2023-10-14 DIAGNOSIS — I714 Abdominal aortic aneurysm, without rupture, unspecified: Secondary | ICD-10-CM | POA: Diagnosis not present

## 2023-10-14 DIAGNOSIS — I493 Ventricular premature depolarization: Secondary | ICD-10-CM | POA: Diagnosis not present

## 2023-10-14 DIAGNOSIS — I6523 Occlusion and stenosis of bilateral carotid arteries: Secondary | ICD-10-CM | POA: Diagnosis not present

## 2023-10-14 DIAGNOSIS — I5032 Chronic diastolic (congestive) heart failure: Secondary | ICD-10-CM | POA: Diagnosis not present

## 2023-10-14 DIAGNOSIS — I10 Essential (primary) hypertension: Secondary | ICD-10-CM | POA: Diagnosis not present

## 2023-10-14 DIAGNOSIS — R600 Localized edema: Secondary | ICD-10-CM | POA: Diagnosis not present

## 2023-10-14 DIAGNOSIS — N189 Chronic kidney disease, unspecified: Secondary | ICD-10-CM | POA: Diagnosis not present

## 2023-10-14 DIAGNOSIS — I2510 Atherosclerotic heart disease of native coronary artery without angina pectoris: Secondary | ICD-10-CM | POA: Diagnosis not present

## 2023-10-14 DIAGNOSIS — I491 Atrial premature depolarization: Secondary | ICD-10-CM | POA: Diagnosis not present

## 2023-10-14 DIAGNOSIS — J449 Chronic obstructive pulmonary disease, unspecified: Secondary | ICD-10-CM | POA: Diagnosis not present

## 2023-10-14 LAB — BRAIN NATRIURETIC PEPTIDE: B Natriuretic Peptide: 216.4 pg/mL — ABNORMAL HIGH (ref 0.0–100.0)

## 2023-10-15 ENCOUNTER — Other Ambulatory Visit: Payer: Self-pay | Admitting: Family Medicine

## 2023-10-15 ENCOUNTER — Inpatient Hospital Stay
Admission: RE | Admit: 2023-10-15 | Discharge: 2023-10-15 | Disposition: A | Discharge status: Home / Self Care | Payer: Self-pay | Source: Ambulatory Visit | Attending: Physician Assistant | Admitting: Physician Assistant

## 2023-10-15 DIAGNOSIS — Z049 Encounter for examination and observation for unspecified reason: Secondary | ICD-10-CM

## 2023-10-16 ENCOUNTER — Other Ambulatory Visit: Payer: Self-pay

## 2023-10-16 ENCOUNTER — Observation Stay

## 2023-10-16 ENCOUNTER — Emergency Department

## 2023-10-16 ENCOUNTER — Inpatient Hospital Stay
Admission: EM | Admit: 2023-10-16 | Discharge: 2023-10-20 | DRG: 291 | Disposition: A | Discharge status: Home Health | Attending: Internal Medicine | Admitting: Internal Medicine

## 2023-10-16 DIAGNOSIS — J9811 Atelectasis: Secondary | ICD-10-CM | POA: Diagnosis not present

## 2023-10-16 DIAGNOSIS — I130 Hypertensive heart and chronic kidney disease with heart failure and stage 1 through stage 4 chronic kidney disease, or unspecified chronic kidney disease: Principal | ICD-10-CM | POA: Diagnosis present

## 2023-10-16 DIAGNOSIS — M4854XA Collapsed vertebra, not elsewhere classified, thoracic region, initial encounter for fracture: Secondary | ICD-10-CM | POA: Diagnosis present

## 2023-10-16 DIAGNOSIS — H3530 Unspecified macular degeneration: Secondary | ICD-10-CM | POA: Diagnosis present

## 2023-10-16 DIAGNOSIS — Z961 Presence of intraocular lens: Secondary | ICD-10-CM | POA: Diagnosis present

## 2023-10-16 DIAGNOSIS — Z9842 Cataract extraction status, left eye: Secondary | ICD-10-CM

## 2023-10-16 DIAGNOSIS — Z79899 Other long term (current) drug therapy: Secondary | ICD-10-CM

## 2023-10-16 DIAGNOSIS — N1832 Chronic kidney disease, stage 3b: Secondary | ICD-10-CM | POA: Diagnosis present

## 2023-10-16 DIAGNOSIS — Z8679 Personal history of other diseases of the circulatory system: Secondary | ICD-10-CM

## 2023-10-16 DIAGNOSIS — J441 Chronic obstructive pulmonary disease with (acute) exacerbation: Secondary | ICD-10-CM | POA: Diagnosis not present

## 2023-10-16 DIAGNOSIS — I2489 Other forms of acute ischemic heart disease: Secondary | ICD-10-CM | POA: Diagnosis present

## 2023-10-16 DIAGNOSIS — F419 Anxiety disorder, unspecified: Secondary | ICD-10-CM | POA: Diagnosis present

## 2023-10-16 DIAGNOSIS — Z885 Allergy status to narcotic agent status: Secondary | ICD-10-CM

## 2023-10-16 DIAGNOSIS — I509 Heart failure, unspecified: Secondary | ICD-10-CM

## 2023-10-16 DIAGNOSIS — Z8616 Personal history of COVID-19: Secondary | ICD-10-CM

## 2023-10-16 DIAGNOSIS — I5033 Acute on chronic diastolic (congestive) heart failure: Secondary | ICD-10-CM | POA: Diagnosis present

## 2023-10-16 DIAGNOSIS — M7989 Other specified soft tissue disorders: Secondary | ICD-10-CM | POA: Diagnosis not present

## 2023-10-16 DIAGNOSIS — E785 Hyperlipidemia, unspecified: Secondary | ICD-10-CM | POA: Diagnosis present

## 2023-10-16 DIAGNOSIS — J9621 Acute and chronic respiratory failure with hypoxia: Secondary | ICD-10-CM | POA: Diagnosis present

## 2023-10-16 DIAGNOSIS — R0602 Shortness of breath: Secondary | ICD-10-CM | POA: Diagnosis not present

## 2023-10-16 DIAGNOSIS — Z9889 Other specified postprocedural states: Secondary | ICD-10-CM

## 2023-10-16 DIAGNOSIS — I6521 Occlusion and stenosis of right carotid artery: Secondary | ICD-10-CM | POA: Diagnosis present

## 2023-10-16 DIAGNOSIS — Z7951 Long term (current) use of inhaled steroids: Secondary | ICD-10-CM

## 2023-10-16 DIAGNOSIS — J449 Chronic obstructive pulmonary disease, unspecified: Secondary | ICD-10-CM | POA: Diagnosis present

## 2023-10-16 DIAGNOSIS — I700 Atherosclerosis of aorta: Secondary | ICD-10-CM | POA: Diagnosis not present

## 2023-10-16 DIAGNOSIS — R000 Tachycardia, unspecified: Secondary | ICD-10-CM | POA: Diagnosis not present

## 2023-10-16 DIAGNOSIS — Z7984 Long term (current) use of oral hypoglycemic drugs: Secondary | ICD-10-CM

## 2023-10-16 DIAGNOSIS — I2510 Atherosclerotic heart disease of native coronary artery without angina pectoris: Secondary | ICD-10-CM | POA: Diagnosis present

## 2023-10-16 DIAGNOSIS — R5381 Other malaise: Secondary | ICD-10-CM | POA: Diagnosis present

## 2023-10-16 DIAGNOSIS — I493 Ventricular premature depolarization: Secondary | ICD-10-CM | POA: Diagnosis not present

## 2023-10-16 DIAGNOSIS — E538 Deficiency of other specified B group vitamins: Secondary | ICD-10-CM | POA: Diagnosis present

## 2023-10-16 DIAGNOSIS — M1990 Unspecified osteoarthritis, unspecified site: Secondary | ICD-10-CM | POA: Diagnosis present

## 2023-10-16 DIAGNOSIS — J9601 Acute respiratory failure with hypoxia: Secondary | ICD-10-CM | POA: Diagnosis present

## 2023-10-16 DIAGNOSIS — Z96641 Presence of right artificial hip joint: Secondary | ICD-10-CM | POA: Diagnosis present

## 2023-10-16 DIAGNOSIS — Z87442 Personal history of urinary calculi: Secondary | ICD-10-CM

## 2023-10-16 DIAGNOSIS — I110 Hypertensive heart disease with heart failure: Secondary | ICD-10-CM | POA: Diagnosis not present

## 2023-10-16 DIAGNOSIS — N138 Other obstructive and reflux uropathy: Secondary | ICD-10-CM | POA: Diagnosis present

## 2023-10-16 DIAGNOSIS — Z87891 Personal history of nicotine dependence: Secondary | ICD-10-CM

## 2023-10-16 DIAGNOSIS — Z809 Family history of malignant neoplasm, unspecified: Secondary | ICD-10-CM

## 2023-10-16 DIAGNOSIS — E871 Hypo-osmolality and hyponatremia: Secondary | ICD-10-CM | POA: Diagnosis present

## 2023-10-16 DIAGNOSIS — Z9841 Cataract extraction status, right eye: Secondary | ICD-10-CM

## 2023-10-16 DIAGNOSIS — I4510 Unspecified right bundle-branch block: Secondary | ICD-10-CM | POA: Diagnosis present

## 2023-10-16 DIAGNOSIS — Z8551 Personal history of malignant neoplasm of bladder: Secondary | ICD-10-CM

## 2023-10-16 DIAGNOSIS — N401 Enlarged prostate with lower urinary tract symptoms: Secondary | ICD-10-CM | POA: Diagnosis present

## 2023-10-16 DIAGNOSIS — Z8546 Personal history of malignant neoplasm of prostate: Secondary | ICD-10-CM

## 2023-10-16 DIAGNOSIS — R0902 Hypoxemia: Secondary | ICD-10-CM | POA: Diagnosis not present

## 2023-10-16 LAB — BASIC METABOLIC PANEL WITH GFR
Anion gap: 7 (ref 5–15)
BUN: 52 mg/dL — ABNORMAL HIGH (ref 8–23)
CO2: 25 mmol/L (ref 22–32)
Calcium: 8.5 mg/dL — ABNORMAL LOW (ref 8.9–10.3)
Chloride: 98 mmol/L (ref 98–111)
Creatinine, Ser: 1.84 mg/dL — ABNORMAL HIGH (ref 0.61–1.24)
GFR, Estimated: 35 mL/min — ABNORMAL LOW (ref >60)
Glucose, Bld: 142 mg/dL — ABNORMAL HIGH (ref 70–99)
Potassium: 3.7 mmol/L (ref 3.5–5.1)
Sodium: 130 mmol/L — ABNORMAL LOW (ref 135–145)

## 2023-10-16 LAB — CBC
HCT: 35.2 % — ABNORMAL LOW (ref 39.0–52.0)
Hemoglobin: 11.3 g/dL — ABNORMAL LOW (ref 13.0–17.0)
MCH: 31 pg (ref 26.0–34.0)
MCHC: 32.1 g/dL (ref 30.0–36.0)
MCV: 96.4 fL (ref 80.0–100.0)
Platelets: 156 10*3/uL (ref 150–400)
RBC: 3.65 MIL/uL — ABNORMAL LOW (ref 4.22–5.81)
RDW: 13.2 % (ref 11.5–15.5)
WBC: 7.2 10*3/uL (ref 4.0–10.5)
nRBC: 0 % (ref 0.0–0.2)

## 2023-10-16 LAB — TROPONIN I (HIGH SENSITIVITY)
Troponin I (High Sensitivity): 60 ng/L — ABNORMAL HIGH (ref <18)
Troponin I (High Sensitivity): 96 ng/L — ABNORMAL HIGH (ref <18)

## 2023-10-16 LAB — BRAIN NATRIURETIC PEPTIDE: B Natriuretic Peptide: 287.4 pg/mL — ABNORMAL HIGH (ref 0.0–100.0)

## 2023-10-16 MED ORDER — SODIUM CHLORIDE 0.9% FLUSH
3.0000 mL | Freq: Two times a day (BID) | INTRAVENOUS | Status: DC
  Administered 2023-10-16 – 2023-10-20 (×9): 3 mL via INTRAVENOUS

## 2023-10-16 MED ORDER — FUROSEMIDE 10 MG/ML IJ SOLN
40.0000 mg | Freq: Once | INTRAMUSCULAR | Status: AC
  Administered 2023-10-16: 40 mg via INTRAVENOUS
  Filled 2023-10-16: qty 4

## 2023-10-16 MED ORDER — ACETAMINOPHEN 325 MG PO TABS
650.0000 mg | ORAL_TABLET | ORAL | Status: DC | PRN
  Administered 2023-10-16 – 2023-10-18 (×3): 650 mg via ORAL
  Filled 2023-10-16 (×3): qty 2

## 2023-10-16 MED ORDER — EMPAGLIFLOZIN 10 MG PO TABS: 10.0000 mg | ORAL_TABLET | Freq: Every day | ORAL | Status: DC

## 2023-10-16 MED ORDER — LORAZEPAM 0.5 MG PO TABS: 0.5000 mg | ORAL_TABLET | Freq: Every day | ORAL | Status: DC | PRN

## 2023-10-16 MED ORDER — SODIUM CHLORIDE 0.9% FLUSH: 3.0000 mL | INTRAVENOUS | Status: DC | PRN

## 2023-10-16 MED ORDER — ATORVASTATIN CALCIUM 20 MG PO TABS
40.0000 mg | ORAL_TABLET | Freq: Every day | ORAL | Status: DC
  Administered 2023-10-17 – 2023-10-20 (×4): 40 mg via ORAL
  Filled 2023-10-16 (×4): qty 2

## 2023-10-16 MED ORDER — METHOCARBAMOL 500 MG PO TABS: 500.0000 mg | ORAL_TABLET | Freq: Three times a day (TID) | ORAL | Status: DC | PRN

## 2023-10-16 MED ORDER — FUROSEMIDE 10 MG/ML IJ SOLN
40.0000 mg | Freq: Two times a day (BID) | INTRAMUSCULAR | Status: DC
  Filled 2023-10-16: qty 4

## 2023-10-16 MED ORDER — IPRATROPIUM-ALBUTEROL 0.5-2.5 (3) MG/3ML IN SOLN
3.0000 mL | Freq: Once | RESPIRATORY_TRACT | Status: AC
  Administered 2023-10-16: 3 mL via RESPIRATORY_TRACT
  Filled 2023-10-16: qty 3

## 2023-10-16 MED ORDER — ONDANSETRON HCL 4 MG/2ML IJ SOLN: 4.0000 mg | Freq: Four times a day (QID) | INTRAMUSCULAR | Status: DC | PRN

## 2023-10-16 MED ORDER — METHYLPREDNISOLONE SODIUM SUCC 125 MG IJ SOLR
125.0000 mg | Freq: Once | INTRAMUSCULAR | Status: AC
  Administered 2023-10-16: 125 mg via INTRAVENOUS
  Filled 2023-10-16: qty 2

## 2023-10-16 MED ORDER — SODIUM CHLORIDE 0.9 % IV SOLN: 250.0000 mL | INTRAVENOUS | Status: AC | PRN

## 2023-10-16 MED ORDER — HEPARIN SODIUM (PORCINE) 5000 UNIT/ML IJ SOLN
5000.0000 [IU] | Freq: Two times a day (BID) | INTRAMUSCULAR | Status: DC
  Administered 2023-10-16 – 2023-10-20 (×8): 5000 [IU] via SUBCUTANEOUS
  Filled 2023-10-16 (×8): qty 1

## 2023-10-16 MED ORDER — ALBUTEROL SULFATE (2.5 MG/3ML) 0.083% IN NEBU: 3.0000 mL | INHALATION_SOLUTION | RESPIRATORY_TRACT | Status: DC | PRN

## 2023-10-16 MED ORDER — TAMSULOSIN HCL 0.4 MG PO CAPS
0.4000 mg | ORAL_CAPSULE | Freq: Every day | ORAL | Status: DC
  Administered 2023-10-17 – 2023-10-20 (×4): 0.4 mg via ORAL
  Filled 2023-10-16 (×4): qty 1

## 2023-10-16 MED ORDER — BUDESON-GLYCOPYRROL-FORMOTEROL 160-9-4.8 MCG/ACT IN AERO
2.0000 | INHALATION_SPRAY | Freq: Two times a day (BID) | RESPIRATORY_TRACT | Status: DC
  Administered 2023-10-17 – 2023-10-20 (×7): 2 via RESPIRATORY_TRACT
  Filled 2023-10-16 (×2): qty 5.9

## 2023-10-16 MED ORDER — TRAMADOL HCL 50 MG PO TABS: 50.0000 mg | ORAL_TABLET | ORAL | Status: DC | PRN

## 2023-10-16 NOTE — ED Provider Notes (Signed)
 [facility] Provider Note    Event Date/Time   First MD Initiated Contact with Patient 10/16/23 [phone]     (approximate)  History   Chief Complaint: Shortness of Breath  HPI  Ruben Green is a 89 y.o. male with a past medical history of anxiety, CAD, CHF, COPD, hypertension, hyperlipidemia, presents to the emergency department for reported low oxygen  level.  Patient is currently wearing a TLSO brace, states they have been checking the oxygen  daily at home.  This morning it was in the 80s, so they brought the patient to the emergency department for evaluation.  Patient states shortness of breath but states this is somewhat chronic given his history of COPD.  He also states his leg swelling has been somewhat worse recently.  No fever.  No increased cough.  Physical Exam   Triage Vital Signs: ED Triage Vitals  Encounter Vitals Group     BP 10/16/23 0949 (!) 150/61     Systolic BP Percentile --      Diastolic BP Percentile --      Pulse Rate 10/16/23 0949 (!) 115     Resp 10/16/23 0949 20     Temp 10/16/23 0949 98.2 F (36.8 C)     Temp Source 10/16/23 0949 Oral     SpO2 10/16/23 0950 91 %     Weight --      Height --      Head Circumference --      Peak Flow --      Pain Score 10/16/23 0950 2     Pain Loc --      Pain Education --      Exclude from Growth Chart --     Most recent vital signs: Vitals:   10/16/23 0949 10/16/23 0950  BP: (!) 150/61   Pulse: (!) 115   Resp: 20   Temp: 98.2 F (36.8 C)   SpO2:  91%    General: Awake, no distress.  CV:  Good peripheral perfusion.  Regular rhythm rate around 120 bpm. Resp:  Mild tachypnea.  Mild expiratory wheeze bilaterally. Abd:  No distention.  Soft, nontender.  No rebound or guarding. Other:  Mild, 1+ lower extreme edema bilaterally.  ED Results / Procedures / Treatments   EKG  EKG viewed and interpreted by myself shows sinus tachycardia at 101 bpm with a widened QRS, left axis  deviation, slight QTc prolongation otherwise reassuring intervals, nonspecific ST changes.  RADIOLOGY  I have reviewed interpret the chest x-ray images.  No obvious consolidation on my evaluation. Radiology is read the x-ray as minimal right basilar atelectasis   MEDICATIONS ORDERED IN ED: Medications  ipratropium-albuterol  (DUONEB) 0.5-2.5 (3) MG/3ML nebulizer solution 3 mL (has no administration in time range)  ipratropium-albuterol  (DUONEB) 0.5-2.5 (3) MG/3ML nebulizer solution 3 mL (has no administration in time range)     IMPRESSION / MDM / ASSESSMENT AND PLAN / ED COURSE  I reviewed the triage vital signs and the nursing notes.  Patient's presentation is most consistent with acute presentation with potential threat to life or bodily function.  Patient presents to the emergency department for shortness of breath found to be satting in the 80s on room air at home.  Here satting around 90% on room air.  Patient has mild tachypnea mild expiratory wheeze and mild tachycardia.  Patient does have slight increase in lower extremity edema per patient as well.  Will check labs including a CBC chemistry troponin and a  BNP.  Will obtain a chest x-ray to evaluate for pneumonia or fluid.  Will dose DuoNebs while awaiting further results.  Differential would include CHF exacerbation, COPD exacerbation, pneumonia, pneumothorax, ACS.  Chest x-ray does not appear to show any significant abnormality.  Patient's workup today shows mild renal insufficiency otherwise reassuring chemistry.  Reassuring CBC.  Patient's troponin is slightly elevated at 60.  BNP is elevated 287.  Given the patient's increased lower extremity edema, hypoxia at 87% on room air and with an elevated BNP I have dosed IV Lasix .  Prior to this we have given Solu-Medrol  DuoNebs as the patient also has a COPD history.  Given the patient's new oxygen  requirement will admit to the hospital service for further workup and treatment.  CRITICAL  CARE Performed by: Ruth Cove   Total critical care time: 30 minutes  Critical care time was exclusive of separately billable procedures and treating other patients.  Critical care was necessary to treat or prevent imminent or life-threatening deterioration.  Critical care was time spent personally by me on the following activities: development of treatment plan with patient and/or surrogate as well as nursing, discussions with consultants, evaluation of patient's response to treatment, examination of patient, obtaining history from patient or surrogate, ordering and performing treatments and interventions, ordering and review of laboratory studies, ordering and review of radiographic studies, pulse oximetry and re-evaluation of patient's condition.   FINAL CLINICAL IMPRESSION(S) / ED DIAGNOSES   Dyspnea Hypoxia CHF exacerbation   Note:  This document was prepared using Dragon voice recognition software and may include unintentional dictation errors.   Ruth Cove, MD 10/16/23 1414

## 2023-10-16 NOTE — ED Notes (Signed)
 See triage notes. Patient c/o shortness of breath and feeling unwell since last night. Patient stated he was tracking his oxygen  saturation this morning and it was staying in the 80s

## 2023-10-16 NOTE — H&P (Signed)
 History and Physical    Ruben Green JWJ:[id number] DOB: [birth year] DOA: 10/16/2023  PCP: Antonio Baumgarten, MD (Confirm with patient/family/NH records and if not entered, this has to be entered at [facility] point of entry) Patient coming from: Home  I have personally briefly reviewed patient's old medical records in [facility]  Chief Complaint: SOB, leg swelling  HPI: Ruben Green is a 89 y.o. male with medical history significant of HTN, chronic HFpEF, chronic hypoxic respiratory failure on 3 L as needed, CKD stage IIIa, CAD, COPD, bladder cancer status post BCG treatment, kidney stones, prostate cancer, AAA s/p repair, RBBB, presented with worsening of hypoxia.  Symptoms started yesterday, patient started develop exertional dyspnea and wife found the patient oxygen  saturation level fluctuate in the mid 80s, despite provided with home oxygen .  In addition, family also noticed patient has had increasing leg swelling right more than left.  Denies any chest pain no cough no fever or chills.  Patient does complain about sometimes feel choking like sensation in the middle throat but denies any cough or choking after eat or drink.  This morning, family called EMS because patient's pulse ox level in the lower 80s.  ED Course: Afebrile, nontachycardic nonhypotensive O2 saturations 91% on room air.  Improved to 94% on 2 L.  Chest x-ray showed mild pulmonary congestion, blood work showed sodium 130 potassium 3.7 BUN 52 creatinine 1.8 compared to baseline 1.6-1.8 troponin 60 WBC 7.2 hemoglobin 11.3.  Patient was given IV Lasix  x 1 and IV Solu-Medrol  x 1 in the ED.  Revi disposition celebration ew of Systems: As per HPI otherwise 14 point review of systems negative.   Past Medical History:  Diagnosis Date   AAA (abdominal aortic aneurysm) (HCC)    a.) s/p repair in 2005   Anemia    Anginal pain (HCC)    Anxiety    Arthritis    B12 deficiency    Bilateral cataracts    a.) s/p  BILATERAL extractions in 2018   BPH with obstruction/lower urinary tract symptoms    CAD (coronary artery disease)    Carotid artery stenosis    a.) s/p CEA on the RIGHT   CHF (congestive heart failure) (HCC)    COPD (chronic obstructive pulmonary disease) (HCC)    Diastolic dysfunction 07/29/2019   a.)  TTE 07/29/2019: EF 50-55%; LA mildly enlarged; G1DD.   DOE (dyspnea on exertion)    Elevated PSA    Environmental and seasonal allergies    History of 2019 novel coronavirus disease (COVID-19)    History of kidney stones    HLD (hyperlipidemia)    HOH (hard of hearing)    HTN (hypertension)    Incomplete bladder emptying    Macular degeneration    Nausea vomiting and diarrhea 09/16/2023   Prostate cancer (HCC)    RBBB (right bundle branch block)    Valvular insufficiency    a.) TTE 07/29/2019: LVEF 50-55%; LA mild enlarged; triv AR/PR, mild MR, mod TR.    Past Surgical History:  Procedure Laterality Date   ABDOMINAL AORTIC ANEURYSM REPAIR     CATARACT EXTRACTION W/PHACO Right 09/25/2016   Procedure: CATARACT EXTRACTION PHACO AND INTRAOCULAR LENS PLACEMENT (IOC);  Surgeon: Clair Crews, MD;  Location: [facility] ORS;  Service: Ophthalmology;  Laterality: Right;  US  01:01 AP% 17.4 CDE 10.75 Fluid pack lot # [id number]   CATARACT EXTRACTION W/PHACO Left 10/16/2016   Procedure: CATARACT EXTRACTION PHACO AND INTRAOCULAR LENS PLACEMENT (IOC) Suture placed in Left  eye;  Surgeon: Clair Crews, MD;  Location: [facility] ORS;  Service: Ophthalmology;  Laterality: Left;  US  2:06.8 AP% 22.2 CDE 28.17 Fluid pack lot # [id number]   COLONOSCOPY WITH PROPOFOL  N/A 03/31/2019   Procedure: COLONOSCOPY WITH PROPOFOL ;  Surgeon: Deveron Fly, MD;  Location: [facility] ENDOSCOPY;  Service: Endoscopy;  Laterality: N/A;   COLONOSCOPY WITH PROPOFOL  N/A 09/09/2020   Procedure: COLONOSCOPY WITH PROPOFOL ;  Surgeon: Shane Darling, MD;  Location: [facility] ENDOSCOPY;  Service: Endoscopy;  Laterality: N/A;    CYSTOSCOPY W/ RETROGRADES Bilateral 02/23/2020   Procedure: CYSTOSCOPY WITH RETROGRADE PYELOGRAM;  Surgeon: Geraline Knapp, MD;  Location: [facility] ORS;  Service: Urology;  Laterality: Bilateral;   CYSTOSCOPY WITH BIOPSY N/A 02/23/2020   Procedure: CYSTOSCOPY WITH BIOPSY;  Surgeon: Geraline Knapp, MD;  Location: [facility] ORS;  Service: Urology;  Laterality: N/A;   EYE SURGERY     HERNIA REPAIR     x 2   INGUINAL HERNIA REPAIR Right 12/02/2015   Procedure: HERNIA REPAIR INGUINAL ADULT;  Surgeon: Benancio Bracket, MD;  Location: [facility] ORS;  Service: General;  Laterality: Right;   PROSTATE SURGERY  2012   Right Carotid Endarterectomy     TOTAL HIP ARTHROPLASTY Right 08/01/2021   Procedure: TOTAL HIP ARTHROPLASTY;  Surgeon: Elner Hahn, MD;  Location: [facility] ORS;  Service: Orthopedics;  Laterality: Right;     reports that he quit smoking about 24 years ago. His smoking use included cigarettes. He has quit using smokeless tobacco. He reports that he does not currently use alcohol . He reports that he does not use drugs.  Allergies  Allergen Reactions   Oxycodone  Other (See Comments)    Caused hallucinations/does not want anymore    Family History  Problem Relation Age of Onset   Cancer Brother        2000   Prostate cancer Neg Hx    Bladder Cancer Neg Hx    Kidney cancer Neg Hx     Prior to Admission medications   Medication Sig Start Date End Date Taking? Authorizing Provider  acetaminophen  (TYLENOL ) 500 MG tablet Take 1-2 tablets (500-1,000 mg total) by mouth every 6 (six) hours as needed for mild pain. 08/04/21   Rojelio Clement, PA-C  albuterol  (VENTOLIN  HFA) 108 (90 Base) MCG/ACT inhaler Inhale 2 puffs into the lungs every 4 (four) hours as needed. 03/24/23   Lind Repine, MD  atorvastatin  (LIPITOR) 40 MG tablet Take 40 mg by mouth daily. 01/02/23   [provider]  empagliflozin  (JARDIANCE ) 10 MG TABS tablet Take 1 tablet (10 mg total) by mouth daily before breakfast.  07/11/23   Charlette Console, FNP  Fluticasone -Umeclidin-Vilant (TRELEGY ELLIPTA) 100-62.5-25 MCG/INH AEPB Inhale 2 puffs into the lungs daily. 09/01/19   [provider]  iron  polysaccharides (NIFEREX) 150 MG capsule Take 150 mg by mouth 2 (two) times daily. 10/23/21   [provider]  LORazepam  (ATIVAN ) 0.5 MG tablet Take 0.5 mg by mouth daily as needed for anxiety. 10/23/21   [provider]  methocarbamol  (ROBAXIN ) 500 MG tablet Take 1 tablet (500 mg total) by mouth every 8 (eight) hours as needed. 09/10/23   Arline Bennett, MD  tamsulosin  (FLOMAX ) 0.4 MG CAPS capsule Take 0.4 mg by mouth daily. 10/04/22   [provider]  traMADol  (ULTRAM ) 50 MG tablet Take 1 tablet (50 mg total) by mouth every 4 (four) hours as needed for moderate pain (pain score 4-6) or severe pain (pain score 7-10). 09/19/23  Alphonsus Jeans, MD    Physical Exam: Vitals:   10/16/23 1230 10/16/23 1300 10/16/23 1330 10/16/23 1354  BP: (!) 134/59 129/63 132/64   Pulse: 89 88 86   Resp: 18 16 (!) 21   Temp:    98.1 F (36.7 C)  TempSrc:    Oral  SpO2: 96% 94% 97%     Constitutional: NAD, calm, comfortable Vitals:   10/16/23 1230 10/16/23 1300 10/16/23 1330 10/16/23 1354  BP: (!) 134/59 129/63 132/64   Pulse: 89 88 86   Resp: 18 16 (!) 21   Temp:    98.1 F (36.7 C)  TempSrc:    Oral  SpO2: 96% 94% 97%    Eyes: PERRL, lids and conjunctivae normal ENMT: Mucous membranes are moist. Posterior pharynx clear of any exudate or lesions.Normal dentition.  Neck: normal, supple, no masses, no thyromegaly Respiratory: clear to auscultation bilaterally, no wheezing, fine crackles on bilateral lower field, increasing respiratory effort. No accessory muscle use.  Cardiovascular: Regular rate and rhythm, no murmurs / rubs / gallops.  2+ extremity edema, right more than left. 2+ pedal pulses. No carotid bruits.  Abdomen: no tenderness, no masses palpated. No hepatosplenomegaly. Bowel sounds  positive.  Musculoskeletal: no clubbing / cyanosis. No joint deformity upper and lower extremities. Good ROM, no contractures. Normal muscle tone.  Skin: no rashes, lesions, ulcers. No induration Neurologic: CN 2-12 grossly intact. Sensation intact, DTR normal. Strength 5/5 in all 4.  Psychiatric: Normal judgment and insight. Alert and oriented x 3. Normal mood.    Labs on Admission: I have personally reviewed following labs and imaging studies  CBC: Recent Labs  Lab 10/16/23 1044  WBC 7.2  HGB 11.3*  HCT 35.2*  MCV 96.4  PLT 156   Basic Metabolic Panel: Recent Labs  Lab 10/16/23 1044  NA 130*  K 3.7  CL 98  CO2 25  GLUCOSE 142*  BUN 52*  CREATININE 1.84*  CALCIUM  8.5*   GFR: CrCl cannot be calculated (Unknown ideal weight.). Liver Function Tests: No results for input(s): "AST", "ALT", "ALKPHOS", "BILITOT", "PROT", "ALBUMIN" in the last 168 hours. No results for input(s): "LIPASE", "AMYLASE" in the last 168 hours. No results for input(s): "AMMONIA" in the last 168 hours. Coagulation Profile: No results for input(s): "INR", "PROTIME" in the last 168 hours. Cardiac Enzymes: No results for input(s): "CKTOTAL", "CKMB", "CKMBINDEX", "TROPONINI" in the last 168 hours. BNP (last 3 results) No results for input(s): "PROBNP" in the last 8760 hours. HbA1C: No results for input(s): "HGBA1C" in the last 72 hours. CBG: No results for input(s): "GLUCAP" in the last 168 hours. Lipid Profile: No results for input(s): "CHOL", "HDL", "LDLCALC", "TRIG", "CHOLHDL", "LDLDIRECT" in the last 72 hours. Thyroid  Function Tests: No results for input(s): "TSH", "T4TOTAL", "FREET4", "T3FREE", "THYROIDAB" in the last 72 hours. Anemia Panel: No results for input(s): "VITAMINB12", "FOLATE", "FERRITIN", "TIBC", "IRON ", "RETICCTPCT" in the last 72 hours. Urine analysis:    Component Value Date/Time   COLORURINE YELLOW (A) 09/16/2023 1543   APPEARANCEUR Clear 09/24/2023 1546   LABSPEC 1.011  09/16/2023 1543   PHURINE 5.0 09/16/2023 1543   GLUCOSEU 1+ (A) 09/24/2023 1546   HGBUR SMALL (A) 09/16/2023 1543   BILIRUBINUR Negative 09/24/2023 1546   KETONESUR NEGATIVE 09/16/2023 1543   PROTEINUR Negative 09/24/2023 1546   PROTEINUR NEGATIVE 09/16/2023 1543   NITRITE Negative 09/24/2023 1546   NITRITE NEGATIVE 09/16/2023 1543   LEUKOCYTESUR Negative 09/24/2023 1546   LEUKOCYTESUR LARGE (A) 09/16/2023 1543  Radiological Exams on Admission: DG Chest Portable 1 View Result Date: 10/16/2023 CLINICAL DATA:  Shortness of breath EXAM: PORTABLE CHEST 1 VIEW COMPARISON:  09/10/2023 FINDINGS: Cardiac shadow is within normal limits. Aortic calcifications are noted. Lungs are well aerated bilaterally. Minimal right basilar atelectasis is noted. No bony abnormality is seen. IMPRESSION: Minimal right basilar atelectasis. Electronically Signed   By: Violeta Grey M.D.   On: 10/16/2023 11:04    EKG: Independently reviewed.  Sinus arrhythmia, no acute ST changes  Assessment/Plan Principal Problem:   CHF (congestive heart failure) (HCC) Active Problems:   Acute respiratory failure with hypoxia (HCC)   Acute on chronic diastolic CHF (congestive heart failure) (HCC)   COPD (chronic obstructive pulmonary disease) (HCC)  (please populate well all problems here in Problem List. (For example, if patient is on BP meds at home and you resume or decide to hold them, it is a problem that needs to be her. Same for CAD, COPD, HLD and so on)  Acute on chronic hypoxic respiratory failure Acute on chronic HFpEF decompensation - Patient has symptoms and signs of fluid overload on physical exam - Switch to IV Lasix  40 mg twice daily -Echo -DVT study  Elevated troponins - Patient denied any chest pains, troponin level trending 60> 96, EKG showed no significant acute ST changes - Clinically suspect elevated troponin secondary to CHF decompensation and hypoxia related to demanding ischemia.  Continue to  trend troponin x 1 more, and echocardiogram ordered  COPD - Appears to be stable, no wheezing on physical exam and most of his symptoms and the drop of O2 saturation can be explained by CHF decompensation. - Continue home breathing medications ICS and LABA and as needed albuterol   CKD stage IIIa - Fluid overload - Creatinine level stable, monitor kidney function while on IV Lasix   Chronic hyponatremia - Likely secondary to CHF, sodium level stable volume overloaded, diuresis as above  CAD - As above  Chronic compression fracture of T7 vertebra -tramadol  and Robaxin  as needed - On TLSO    DVT prophylaxis: Heparin  subcu Code Status: Full code Family Communication: Wife at bedside Disposition Plan: Expect less than 2 midnight hospital stay Consults called: None Admission status: Telemetry observation   [place] MD [facility] Pager [phone]  10/16/2023, 3:50 PM

## 2023-10-16 NOTE — ED Triage Notes (Addendum)
 Pt to Ed via POV from home. Pt reports SOB and feeling unwell since last pm. Pt report shas been checking oxygen  at home and was reading low 80s. Pt has thoracic brace on for fx sin February

## 2023-10-16 NOTE — ED Notes (Signed)
 Date and time results received: 10/16/23 1725  Test: Troponin Critical Value: 105  Name of Provider Notified: MD Jeane Miguel

## 2023-10-17 ENCOUNTER — Observation Stay
Admit: 2023-10-17 | Discharge: 2023-10-17 | Disposition: A | Discharge status: Home / Self Care | Attending: Internal Medicine | Admitting: Internal Medicine

## 2023-10-17 DIAGNOSIS — N183 Chronic kidney disease, stage 3 unspecified: Secondary | ICD-10-CM | POA: Diagnosis not present

## 2023-10-17 DIAGNOSIS — I2510 Atherosclerotic heart disease of native coronary artery without angina pectoris: Secondary | ICD-10-CM | POA: Diagnosis not present

## 2023-10-17 DIAGNOSIS — I5033 Acute on chronic diastolic (congestive) heart failure: Secondary | ICD-10-CM | POA: Diagnosis not present

## 2023-10-17 DIAGNOSIS — I493 Ventricular premature depolarization: Secondary | ICD-10-CM | POA: Diagnosis not present

## 2023-10-17 LAB — BASIC METABOLIC PANEL WITH GFR
Anion gap: 8 (ref 5–15)
BUN: 61 mg/dL — ABNORMAL HIGH (ref 8–23)
CO2: 26 mmol/L (ref 22–32)
Calcium: 8.8 mg/dL — ABNORMAL LOW (ref 8.9–10.3)
Chloride: 99 mmol/L (ref 98–111)
Creatinine, Ser: 1.84 mg/dL — ABNORMAL HIGH (ref 0.61–1.24)
GFR, Estimated: 35 mL/min — ABNORMAL LOW (ref >60)
Glucose, Bld: 204 mg/dL — ABNORMAL HIGH (ref 70–99)
Potassium: 3.9 mmol/L (ref 3.5–5.1)
Sodium: 133 mmol/L — ABNORMAL LOW (ref 135–145)

## 2023-10-17 LAB — RESPIRATORY PANEL BY PCR

## 2023-10-17 LAB — TROPONIN I (HIGH SENSITIVITY): Troponin I (High Sensitivity): 105 ng/L (ref <18)

## 2023-10-17 MED ORDER — FUROSEMIDE 10 MG/ML IJ SOLN
40.0000 mg | Freq: Every day | INTRAMUSCULAR | Status: DC
  Administered 2023-10-17 – 2023-10-20 (×4): 40 mg via INTRAVENOUS
  Filled 2023-10-17 (×3): qty 4

## 2023-10-17 MED ORDER — EMPAGLIFLOZIN 10 MG PO TABS
10.0000 mg | ORAL_TABLET | Freq: Every day | ORAL | Status: DC
  Administered 2023-10-18 – 2023-10-20 (×3): 10 mg via ORAL
  Filled 2023-10-17 (×3): qty 1

## 2023-10-17 NOTE — Progress Notes (Signed)
 Education Assessment and Provision:  Detailed education and instructions provided on heart failure disease management including the following: with patient, wife and son at the bedside.  Signs and symptoms of Heart Failure When to call the physician Importance of daily weights Low sodium diet Fluid restriction Medication management Anticipated future follow-up appointments with [facility]  Patient education given on each of the above topics.  Patient acknowledges understanding via teach back method and acceptance of all instructions.  Education Materials:  "Living Better With Heart Failure" Booklet, HF zone tool, & Daily Weight Tracker Tool.  Patient has scale at home: Yes Family would like assistance with pill organization.  Bevely Brush notified of family concern for follow-up in regards to patient medications.    Celedonio Coil, RN, BSN [facility] Heart Failure Navigator Secure Chat Only

## 2023-10-17 NOTE — Evaluation (Signed)
 Clinical/Bedside Swallow Evaluation Patient Details  Name: Ruben Green MRN: [id number] Date of Birth: Sep 28, 1933  Today's Date: 10/17/2023 Time: SLP Start Time (ACUTE ONLY): 0845 SLP Stop Time (ACUTE ONLY): 0900 SLP Time Calculation (min) (ACUTE ONLY): 15 min  Past Medical History:  Past Medical History:  Diagnosis Date   AAA (abdominal aortic aneurysm) (HCC)    a.) s/p repair in 2005   Anemia    Anginal pain (HCC)    Anxiety    Arthritis    B12 deficiency    Bilateral cataracts    a.) s/p BILATERAL extractions in 2018   BPH with obstruction/lower urinary tract symptoms    CAD (coronary artery disease)    Carotid artery stenosis    a.) s/p CEA on the RIGHT   CHF (congestive heart failure) (HCC)    COPD (chronic obstructive pulmonary disease) (HCC)    Diastolic dysfunction 07/29/2019   a.)  TTE 07/29/2019: EF 50-55%; LA mildly enlarged; G1DD.   DOE (dyspnea on exertion)    Elevated PSA    Environmental and seasonal allergies    History of 2019 novel coronavirus disease (COVID-19)    History of kidney stones    HLD (hyperlipidemia)    HOH (hard of hearing)    HTN (hypertension)    Incomplete bladder emptying    Macular degeneration    Nausea vomiting and diarrhea 09/16/2023   Prostate cancer (HCC)    RBBB (right bundle branch block)    Valvular insufficiency    a.) TTE 07/29/2019: LVEF 50-55%; LA mild enlarged; triv AR/PR, mild MR, mod TR.   Past Surgical History:  Past Surgical History:  Procedure Laterality Date   ABDOMINAL AORTIC ANEURYSM REPAIR     CATARACT EXTRACTION W/PHACO Right 09/25/2016   Procedure: CATARACT EXTRACTION PHACO AND INTRAOCULAR LENS PLACEMENT (IOC);  Surgeon: Clair Crews, MD;  Location: [facility] ORS;  Service: Ophthalmology;  Laterality: Right;  US  01:01 AP% 17.4 CDE 10.75 Fluid pack lot # [id number]   CATARACT EXTRACTION W/PHACO Left 10/16/2016   Procedure: CATARACT EXTRACTION PHACO AND INTRAOCULAR LENS PLACEMENT (IOC) Suture placed in Left  eye;  Surgeon: Clair Crews, MD;  Location: [facility];  Service: Ophthalmology;  Laterality: Left;  US  2:06.8 AP% 22.2 CDE 28.17 Fluid pack lot # [id number]   COLONOSCOPY WITH PROPOFOL  N/A 03/31/2019   Procedure: COLONOSCOPY WITH PROPOFOL ;  Surgeon: Deveron Fly, MD;  Location: [facility];  Service: Endoscopy;  Laterality: N/A;   COLONOSCOPY WITH PROPOFOL  N/A 09/09/2020   Procedure: COLONOSCOPY WITH PROPOFOL ;  Surgeon: Shane Darling, MD;  Location: [facility] ENDOSCOPY;  Service: Endoscopy;  Laterality: N/A;   CYSTOSCOPY W/ RETROGRADES Bilateral 02/23/2020   Procedure: CYSTOSCOPY WITH RETROGRADE PYELOGRAM;  Surgeon: Geraline Knapp, MD;  Location: [facility];  Service: Urology;  Laterality: Bilateral;   CYSTOSCOPY WITH BIOPSY N/A 02/23/2020   Procedure: CYSTOSCOPY WITH BIOPSY;  Surgeon: Geraline Knapp, MD;  Location: [facility];  Service: Urology;  Laterality: N/A;   EYE SURGERY     HERNIA REPAIR     x 2   INGUINAL HERNIA REPAIR Right 12/02/2015   Procedure: HERNIA REPAIR INGUINAL ADULT;  Surgeon: Benancio Bracket, MD;  Location: [facility];  Service: General;  Laterality: Right;   PROSTATE SURGERY  2012   Right Carotid Endarterectomy     TOTAL HIP ARTHROPLASTY Right 08/01/2021   Procedure: TOTAL HIP ARTHROPLASTY;  Surgeon: Elner Hahn, MD;  Location: [facility] OR[facility];  Service: Orthopedics;  Laterality: Right;   HPI:  Per  H&P: "Ruben Green is a 89 y.o. male with medical history significant of HTN, chronic HFpEF, chronic hypoxic respiratory failure on 3 L as needed, CKD stage IIIa, CAD, COPD, bladder cancer status post BCG treatment, kidney stones, prostate cancer, AAA s/p repair, RBBB, presented with worsening of hypoxia. Symptoms started yesterday, patient started develop exertional dyspnea and wife found the patient oxygen  saturation level fluctuate in the mid 80s, despite provided with home oxygen .  In addition, family also noticed patient has had increasing leg swelling right more  than left.  Denies any chest pain no cough no fever or chills.  Patient does complain about sometimes feel choking like sensation in the middle throat but denies any cough or choking after eat or drink.  This morning, family called EMS because patient's pulse ox level in the lower 80s." CXR 10/16/23: Minimal right basilar atelectasis.    Assessment / Plan / Recommendation  Clinical Impression  Pt seen for bedside swallow assessment in the setting of concern for aspiration/dysphagia. Son present for duration of session- pt/family denied interpreter. Consistent with baseline, pt on 3L nasal canula with O2 saturations maintained at 90-93 for duration of session.  Pt seen with trials of thin liquids (via straw) and regular solids. No overt or subtle s/sx pharyngeal dysphagia noted. No change to vocal quality across trials. Vitals stable for duration of trials. Oral phase grossly intact- with complete manipulation and clearance of regular solid from oral cavity. Pt's son denied history of PNA or dysphagia. Son reporting low appetite and intake recently. Based on age, pulmonary status, and current debility, pt is at increased risk of aspiration, therefore recommend aspiration precautions (slow rate, small bites, elevated HOB, and alert for PO intake). Continue with current unrestricted diet.  MD and RN aware of recommendations. No follow up SLP services indicated. SLP Visit Diagnosis: Dysphagia, unspecified (R13.10)    Aspiration Risk  Mild aspiration risk    Diet Recommendation   Thin;Age appropriate regular  Medication Administration: Whole meds with liquid    Other  Recommendations Oral Care Recommendations: Oral care BID    Recommendations for follow up therapy are one component of a multi-disciplinary discharge planning process, led by the attending physician.  Recommendations may be updated based on patient status, additional functional criteria and insurance authorization.  Follow up  Recommendations No SLP follow up      Assistance Recommended at Discharge  Intermittent supervision for PO intake  Functional Status Assessment Patient has not had a recent decline in their functional status (in regards to oropharyngeal function)    Swallow Study   General Date of Onset: 10/17/23 HPI: Per H&P: "Ruben Green is a 89 y.o. male with medical history significant of HTN, chronic HFpEF, chronic hypoxic respiratory failure on 3 L as needed, CKD stage IIIa, CAD, COPD, bladder cancer status post BCG treatment, kidney stones, prostate cancer, AAA s/p repair, RBBB, presented with worsening of hypoxia. Symptoms started yesterday, patient started develop exertional dyspnea and wife found the patient oxygen  saturation level fluctuate in the mid 80s, despite provided with home oxygen .  In addition, family also noticed patient has had increasing leg swelling right more than left.  Denies any chest pain no cough no fever or chills.  Patient does complain about sometimes feel choking like sensation in the middle throat but denies any cough or choking after eat or drink.  This morning, family called EMS because patient's pulse ox level in the lower 80s." CXR 10/16/23: Minimal right basilar atelectasis.  Type of Study: Bedside Swallow Evaluation Previous Swallow Assessment: July 2023- rec regular solids and thin liquids Diet Prior to this Study: Regular;Thin liquids (Level 0) Temperature Spikes Noted: No (98.6; WBC 7.2) Respiratory Status: Nasal cannula (3L) History of Recent Intubation: No Behavior/Cognition: Alert;Cooperative Oral Cavity Assessment: Within Functional Limits Oral Care Completed by SLP: Recent completion by staff Oral Cavity - Dentition: Adequate natural dentition Vision: Functional for self-feeding Self-Feeding Abilities: Able to feed self Patient Positioning: Upright in bed Baseline Vocal Quality: Normal Volitional Cough: Strong Volitional Swallow: Able to elicit     Oral/Motor/Sensory Function Overall Oral Motor/Sensory Function: Within functional limits   Ice Chips Ice chips: Not tested   Thin Liquid Thin Liquid: Within functional limits Presentation: Straw;Self Fed    Nectar Thick Nectar Thick Liquid: Not tested   Honey Thick Honey Thick Liquid: Not tested   Puree Puree: Within functional limits Presentation: Self Fed;Spoon   Solid     Solid: Within functional limits Presentation: Self Fed     [place] Graycee Greeson Clapp, MS, CCC-SLP Speech Language Pathologist Rehab Services; [facility] [place] [phone] (ascom)   [place] J Clapp 10/17/2023,10:58 AM

## 2023-10-17 NOTE — Care Management Obs Status (Signed)
 MEDICARE OBSERVATION STATUS NOTIFICATION   Patient Details  Name: Ruben Green MRN: [id number] Date of Birth: 1933-10-17   Medicare Observation Status Notification Given:  Rudolph Cost, CMA 10/17/2023, 1:49 PM

## 2023-10-17 NOTE — Progress Notes (Signed)
 PROGRESS NOTE    Ruben Green  WUJ:[id number] DOB: [birth year] DOA: 10/16/2023 PCP: Antonio Baumgarten, MD   Assessment & Plan:   Principal Problem:   CHF (congestive heart failure) (HCC) Active Problems:   Acute respiratory failure with hypoxia (HCC)   Acute on chronic diastolic CHF (congestive heart failure) (HCC)   COPD (chronic obstructive pulmonary disease) (HCC)  Assessment and Plan:  Acute on chronic hypoxic respiratory failure: likely secondary to CHF exacerbation. Continue on supplemental oxygen  and wean back to baseline as tolerated   Acute on chronic diastolic CHF: continue on IV lasix . Monitor I/Os. Echo pending. Needs repeat CHF education. Cardio following and recs apprec    Elevated troponins: likely secondary to demand ischemia    COPD: w/o exacerbation. Continue on bronchodilators    CKDIIIa: Cr is stable. Avoid nephrotoxic meds    Chronic hyponatremia: trending up today. Will continue to monitor    Hx of CAD: was not on GDMT as per med rec.    Chronic compression fracture of T7 vertebra: continue on home dose of tramadol , robaxin  prn. Continue w/ TLSO brace       DVT prophylaxis: heparin  Code Status: full  Family Communication: discussed pt's care w/ pt's son at bedside and answered his questions  Disposition Plan: depends on PT/OT recs   Level of care: Telemetry Cardiac  Status is: Observation The patient remains OBS appropriate and will d/c before 2 midnights.    Consultants:  Cardio   Procedures:   Antimicrobials:    Subjective: Pt c/o shortness of breath   Objective: Vitals:   10/17/23 0500 10/17/23 0530 10/17/23 0600 10/17/23 0630  BP: (!) 150/87 138/74 139/61 118/64  Pulse: 92 90 86 85  Resp: (!) 21 19 17 18   Temp:      TempSrc:      SpO2: 92% 92% 91% 93%    Intake/Output Summary (Last 24 hours) at 10/17/2023 9562 Last data filed at 10/16/2023 1614 Gross per 24 hour  Intake 3 ml  Output --  Net 3 ml   There were no  vitals filed for this visit.  Examination:  General exam: Appears calm but uncomfortable  Respiratory system: decreased breath sounds b/l  Cardiovascular system: S1 & S2+. No rubs, gallops or clicks. 2+ pitting edema b/l LE Gastrointestinal system: Abdomen is nondistended, soft and nontender. Normal bowel sounds heard. Central nervous system: Alert and oriented. Moves all extremities  Psychiatry: Judgement and insight appears at baseline. Flat mood and affect      Data Reviewed: I have personally reviewed following labs and imaging studies  CBC: Recent Labs  Lab 10/16/23 1044  WBC 7.2  HGB 11.3*  HCT 35.2*  MCV 96.4  PLT 156   Basic Metabolic Panel: Recent Labs  Lab 10/16/23 1044 10/17/23 0439  NA 130* 133*  K 3.7 3.9  CL 98 99  CO2 25 26  GLUCOSE 142* 204*  BUN 52* 61*  CREATININE 1.84* 1.84*  CALCIUM  8.5* 8.8*   GFR: CrCl cannot be calculated (Unknown ideal weight.). Liver Function Tests: No results for input(s): "AST", "ALT", "ALKPHOS", "BILITOT", "PROT", "ALBUMIN" in the last 168 hours. No results for input(s): "LIPASE", "AMYLASE" in the last 168 hours. No results for input(s): "AMMONIA" in the last 168 hours. Coagulation Profile: No results for input(s): "INR", "PROTIME" in the last 168 hours. Cardiac Enzymes: No results for input(s): "CKTOTAL", "CKMB", "CKMBINDEX", "TROPONINI" in the last 168 hours. BNP (last 3 results) No results for input(s): "PROBNP" in the last  8760 hours. HbA1C: No results for input(s): "HGBA1C" in the last 72 hours. CBG: No results for input(s): "GLUCAP" in the last 168 hours. Lipid Profile: No results for input(s): "CHOL", "HDL", "LDLCALC", "TRIG", "CHOLHDL", "LDLDIRECT" in the last 72 hours. Thyroid  Function Tests: No results for input(s): "TSH", "T4TOTAL", "FREET4", "T3FREE", "THYROIDAB" in the last 72 hours. Anemia Panel: No results for input(s): "VITAMINB12", "FOLATE", "FERRITIN", "TIBC", "IRON ", "RETICCTPCT" in the last 72  hours. Sepsis Labs: No results for input(s): "PROCALCITON", "LATICACIDVEN" in the last 168 hours.  Recent Results (from the past 240 hours)  Respiratory (~20 pathogens) panel by PCR     Status: None   Collection Time: 10/16/23  4:14 PM   Specimen: Nasopharyngeal Swab; Respiratory  Result Value Ref Range Status   Adenovirus NOT DETECTED NOT DETECTED Final   Coronavirus 229E NOT DETECTED NOT DETECTED Final    Comment: (NOTE) The Coronavirus on the Respiratory Panel, DOES NOT test for the novel  Coronavirus (2019 nCoV)    Coronavirus HKU1 NOT DETECTED NOT DETECTED Final   Coronavirus NL63 NOT DETECTED NOT DETECTED Final   Coronavirus OC43 NOT DETECTED NOT DETECTED Final   Metapneumovirus NOT DETECTED NOT DETECTED Final   Rhinovirus / Enterovirus NOT DETECTED NOT DETECTED Final   Influenza A NOT DETECTED NOT DETECTED Final   Influenza B NOT DETECTED NOT DETECTED Final   Parainfluenza Virus 1 NOT DETECTED NOT DETECTED Final   Parainfluenza Virus 2 NOT DETECTED NOT DETECTED Final   Parainfluenza Virus 3 NOT DETECTED NOT DETECTED Final   Parainfluenza Virus 4 NOT DETECTED NOT DETECTED Final   Respiratory Syncytial Virus NOT DETECTED NOT DETECTED Final   Bordetella pertussis NOT DETECTED NOT DETECTED Final   Bordetella Parapertussis NOT DETECTED NOT DETECTED Final   Chlamydophila pneumoniae NOT DETECTED NOT DETECTED Final   Mycoplasma pneumoniae NOT DETECTED NOT DETECTED Final    Comment: Performed at [facility], [address], [place], [place] [address]         Radiology Studies: US  Venous Img Lower Bilateral (DVT) Result Date: 10/16/2023 CLINICAL DATA:  Swelling for 3-4 months EXAM: BILATERAL LOWER EXTREMITY VENOUS DOPPLER ULTRASOUND TECHNIQUE: Gray-scale sonography with compression, as well as color and duplex ultrasound, were performed to evaluate the deep venous system(s) from the level of the common femoral vein through the popliteal and proximal calf veins. COMPARISON:   Lower extremity ultrasound 03/24/2023 FINDINGS: VENOUS Normal compressibility of the common femoral, superficial femoral, and popliteal veins, as well as the visualized calf veins. Visualized portions of profunda femoral vein and great saphenous vein unremarkable. No filling defects to suggest DVT on grayscale or color Doppler imaging. Doppler waveforms show normal direction of venous flow, normal respiratory plasticity and response to augmentation. Limited views of the contralateral common femoral vein are unremarkable. OTHER None. Limitations: none IMPRESSION: Negative. Electronically Signed   By: Rozell Cornet M.D.   On: 10/16/2023 19:54   DG Chest Portable 1 View Result Date: 10/16/2023 CLINICAL DATA:  Shortness of breath EXAM: PORTABLE CHEST 1 VIEW COMPARISON:  09/10/2023 FINDINGS: Cardiac shadow is within normal limits. Aortic calcifications are noted. Lungs are well aerated bilaterally. Minimal right basilar atelectasis is noted. No bony abnormality is seen. IMPRESSION: Minimal right basilar atelectasis. Electronically Signed   By: Violeta Grey M.D.   On: 10/16/2023 11:04        Scheduled Meds:  atorvastatin   40 mg Oral Daily   budeson-glycopyrrolate -formoterol   2 puff Inhalation BID   [START ON 10/18/2023] empagliflozin   10  mg Oral QAC breakfast   furosemide   40 mg Intravenous BID   heparin   5,000 Units Subcutaneous Q12H   sodium chloride  flush  3 mL Intravenous Q12H   tamsulosin   0.4 mg Oral Daily   Continuous Infusions:  sodium chloride        LOS: 0 days     Alphonsus Jeans, MD [facility] Pager [phone]  If 7PM-7AM, please contact night-coverage [email or web address] 10/17/2023, 9:23 AM

## 2023-10-17 NOTE — Consult Note (Signed)
 [facility] CLINIC CARDIOLOGY CONSULT NOTE       Patient ID: Ruben Green MRN: [id number] DOB/AGE: 09-15-1933 89 y.o.  Admit date: 10/16/2023 Referring Physician Dr. Deena Farrier Primary Physician Antonio Baumgarten, MD  Primary Cardiologist Guillermo Lees, NP Reason for Consultation AoCHF  HPI: Ruben Green is a 89 y.o. male  with a past medical history of chronic HFpEF, carotid stenosis s/p right CEA 2021, AAA s/p repair 2005, coronary atherosclerosis (reported cardiac cath several years ago), hypertension who presented to the ED on 10/16/2023 for SOB and LE edema. Cardiology was consulted for further evaluation.   Patient states he reported to the ED due to worsening SOB and LE edema. Patient's daughter reports she noticed his BP elevated and decrease in SpO2. Work up in the ED notable for sodium 130, potassium 3.7, creatinine 1.84 (around baseline), hemoglobin 11.3, platelets 156. Viral respiratory panel pending.  CXR with no significant vascular congestion.  Lower extremity ultrasound with no evidence of DVT.  Troponins trending 60 > 96 > 105.  BNP 216 > 284 (BNP improved from 09/16/2023 from 1224).  EKG in ED with sinus rhythm, frequent PVCs, RBBB at 101 bpm.  Patient received 1 dose of IV Lasix  40 mg, DuoNeb treatments, Solu-Medrol , and started on SQ heparin  injections.   At the time of my evaluation this morning patient was resting comfortably in hospital bed at an incline with wife and daughter at bedside. Discussed patients symptoms in further. Patient states he noticed worsening SOB and lower extremity edema yesterday with desaturation of SpO2. Patient's daughter reports the patient doesn't take his medication as prescribed and misses doses. Patient reports he uses O2 during the night and doesn't require O2 during the day. Patient states his SOB feels the same today and denies chest pain. BP and HR are stable this AM. Patient on 3L [place] with stable SpO2.   Review of systems  complete and found to be negative unless listed above    Past Medical History:  Diagnosis Date   AAA (abdominal aortic aneurysm) (HCC)    a.) s/p repair in 2005   Anemia    Anginal pain (HCC)    Anxiety    Arthritis    B12 deficiency    Bilateral cataracts    a.) s/p BILATERAL extractions in 2018   BPH with obstruction/lower urinary tract symptoms    CAD (coronary artery disease)    Carotid artery stenosis    a.) s/p CEA on the RIGHT   CHF (congestive heart failure) (HCC)    COPD (chronic obstructive pulmonary disease) (HCC)    Diastolic dysfunction 07/29/2019   a.)  TTE 07/29/2019: EF 50-55%; LA mildly enlarged; G1DD.   DOE (dyspnea on exertion)    Elevated PSA    Environmental and seasonal allergies    History of 2019 novel coronavirus disease (COVID-19)    History of kidney stones    HLD (hyperlipidemia)    HOH (hard of hearing)    HTN (hypertension)    Incomplete bladder emptying    Macular degeneration    Nausea vomiting and diarrhea 09/16/2023   Prostate cancer (HCC)    RBBB (right bundle branch block)    Valvular insufficiency    a.) TTE 07/29/2019: LVEF 50-55%; LA mild enlarged; triv AR/PR, mild MR, mod TR.    Past Surgical History:  Procedure Laterality Date   ABDOMINAL AORTIC ANEURYSM REPAIR     CATARACT EXTRACTION W/PHACO Right 09/25/2016   Procedure: CATARACT EXTRACTION PHACO AND INTRAOCULAR LENS  PLACEMENT (IOC);  Surgeon: Clair Crews, MD;  Location: [facility] ORS;  Service: Ophthalmology;  Laterality: Right;  US  01:01 AP% 17.4 CDE 10.75 Fluid pack lot # [id number]   CATARACT EXTRACTION W/PHACO Left 10/16/2016   Procedure: CATARACT EXTRACTION PHACO AND INTRAOCULAR LENS PLACEMENT (IOC) Suture placed in Left eye;  Surgeon: Clair Crews, MD;  Location: [facility] ORS;  Service: Ophthalmology;  Laterality: Left;  US  2:06.8 AP% 22.2 CDE 28.17 Fluid pack lot # [id number]   COLONOSCOPY WITH PROPOFOL  N/A 03/31/2019   Procedure: COLONOSCOPY WITH PROPOFOL ;  Surgeon:  Deveron Fly, MD;  Location[facility] ENDOSCOPY;  Service: Endoscopy;  Laterality: N/A;   COLONOSCOPY WITH PROPOFOL  N/A 09/09/2020   Procedure: COLONOSCOPY WITH PROPOFOL ;  Surgeon: Shane Darling, MD;  Location: [facility] ENDOSCOPY;  Service: Endoscopy;  Laterality: N/A;   CYSTOSCOPY W/ RETROGRADES Bilateral 02/23/2020   Procedure: CYSTOSCOPY WITH RETROGRADE PYELOGRAM;  Surgeon: Geraline Knapp, MD;  Location: [facility] ORS;  Service: Urology;  Laterality: Bilateral;   CYSTOSCOPY WITH BIOPSY N/A 02/23/2020   Procedure: CYSTOSCOPY WITH BIOPSY;  Surgeon: Geraline Knapp, MD;  Location: [facility] ORS;  Service: Urology;  Laterality: N/A;   EYE SURGERY     HERNIA REPAIR     x 2   INGUINAL HERNIA REPAIR Right 12/02/2015   Procedure: HERNIA REPAIR INGUINAL ADULT;  Surgeon: Benancio Bracket, MD;  Location: [facility] ORS;  Service: General;  Laterality: Right;   PROSTATE SURGERY  2012   Right Carotid Endarterectomy     TOTAL HIP ARTHROPLASTY Right 08/01/2021   Procedure: TOTAL HIP ARTHROPLASTY;  Surgeon: Elner Hahn, MD;  Location: [facility] ORS;  Service: Orthopedics;  Laterality: Right;    (Not in a hospital admission)  Social History   Socioeconomic History   Marital status: Married    Spouse name: Not on file   Number of children: Not on file   Years of education: Not on file   Highest education level: Not on file  Occupational History   Not on file  Tobacco Use   Smoking status: Former    Current packs/day: 0.00    Types: Cigarettes    Quit date: 11/30/1998    Years since quitting: 24.8   Smokeless tobacco: Former   Tobacco comments:    quit 10 years ago  Vaping Use   Vaping status: Never Used  Substance and Sexual Activity   Alcohol  use: Not Currently    Comment: occasional   Drug use: No   Sexual activity: Not on file  Other Topics Concern   Not on file  Social History Narrative   Not on file   Social Drivers of Health   Financial Resource Strain: Low Risk  (07/22/2023)   Received from  [facility]   Overall Financial Resource Strain (CARDIA)    Difficulty of Paying Living Expenses: Not hard at all  Food Insecurity: No Food Insecurity (10/16/2023)   Hunger Vital Sign    Worried About Running Out of Food in the Last Year: Never true    Ran Out of Food in the Last Year: Never true  Transportation Needs: No Transportation Needs (10/16/2023)   PRAPARE - Administrator, Civil Service (Medical): No    Lack of Transportation (Non-Medical): No  Physical Activity: Unknown (04/23/2017)   Received from [facility], [facility]   Exercise Vital Sign    Days of Exercise per Week: Patient declined    Minutes of Exercise per Session: Patient  declined  Stress: Unknown (04/23/2017)   Received from [facility], [facility]   [employer] of Occupational Health - Occupational Stress Questionnaire    Feeling of Stress : Patient declined  Social Connections: Moderately Isolated (10/16/2023)   Social Connection and Isolation Panel [NHANES]    Frequency of Communication with Friends and Family: More than three times a week    Frequency of Social Gatherings with Friends and Family: More than three times a week    Attends Religious Services: Never    Database administrator or Organizations: No    Attends Banker Meetings: Never    Marital Status: Married  Catering manager Violence: Not At Risk (10/16/2023)   Humiliation, Afraid, Rape, and Kick questionnaire    Fear of Current or Ex-Partner: No    Emotionally Abused: No    Physically Abused: No    Sexually Abused: No    Family History  Problem Relation Age of Onset   Cancer Brother        2000   Prostate cancer Neg Hx    Bladder Cancer Neg Hx    Kidney cancer Neg Hx      Vitals:   10/17/23 0500 10/17/23 0530 10/17/23 0600 10/17/23 0630  BP: (!) 150/87 138/74 139/61 118/64  Pulse: 92 90 86 85  Resp: (!) 21 19 17 18    Temp:      TempSrc:      SpO2: 92% 92% 91% 93%    PHYSICAL EXAM General: Well appearing elderly male, well nourished, in no acute distress. HEENT: Normocephalic and atraumatic. Neck: No JVD.  Lungs: Normal respiratory effort on 3L. Bibasilar crackles.  Heart: HRRR. Normal S1 and S2 without gallops or murmurs.  Abdomen: Non-distended appearing.  Msk: Normal strength and tone for age. Extremities: Warm and well perfused. No clubbing, cyanosis. 2+ pitting edema bilaterally.  Neuro: Alert and oriented X 3. Psych: Answers questions appropriately.   Labs: Basic Metabolic Panel: Recent Labs    10/16/23 1044 10/17/23 0439  NA 130* 133*  K 3.7 3.9  CL 98 99  CO2 25 26  GLUCOSE 142* 204*  BUN 52* 61*  CREATININE 1.84* 1.84*  CALCIUM  8.5* 8.8*   Liver Function Tests: No results for input(s): "AST", "ALT", "ALKPHOS", "BILITOT", "PROT", "ALBUMIN" in the last 72 hours. No results for input(s): "LIPASE", "AMYLASE" in the last 72 hours. CBC: Recent Labs    10/16/23 1044  WBC 7.2  HGB 11.3*  HCT 35.2*  MCV 96.4  PLT 156   Cardiac Enzymes: Recent Labs    10/16/23 1044 10/16/23 1346 10/16/23 1614  TROPONINIHS 60* 96* 105*   BNP: Recent Labs    10/16/23 1044  BNP 287.4*   D-Dimer: No results for input(s): "DDIMER" in the last 72 hours. Hemoglobin A1C: No results for input(s): "HGBA1C" in the last 72 hours. Fasting Lipid Panel: No results for input(s): "CHOL", "HDL", "LDLCALC", "TRIG", "CHOLHDL", "LDLDIRECT" in the last 72 hours. Thyroid  Function Tests: No results for input(s): "TSH", "T4TOTAL", "T3FREE", "THYROIDAB" in the last 72 hours.  Invalid input(s): "FREET3" Anemia Panel: No results for input(s): "VITAMINB12", "FOLATE", "FERRITIN", "TIBC", "IRON ", "RETICCTPCT" in the last 72 hours.   Radiology: US  Venous Img Lower Bilateral (DVT) Result Date: 10/16/2023 CLINICAL DATA:  Swelling for 3-4 months EXAM: BILATERAL LOWER EXTREMITY VENOUS DOPPLER ULTRASOUND  TECHNIQUE: Gray-scale sonography with compression, as well as color and duplex ultrasound, were performed to evaluate the deep venous system(s) from the level of  the common femoral vein through the popliteal and proximal calf veins. COMPARISON:  Lower extremity ultrasound 03/24/2023 FINDINGS: VENOUS Normal compressibility of the common femoral, superficial femoral, and popliteal veins, as well as the visualized calf veins. Visualized portions of profunda femoral vein and great saphenous vein unremarkable. No filling defects to suggest DVT on grayscale or color Doppler imaging. Doppler waveforms show normal direction of venous flow, normal respiratory plasticity and response to augmentation. Limited views of the contralateral common femoral vein are unremarkable. OTHER None. Limitations: none IMPRESSION: Negative. Electronically Signed   By: Rozell Cornet M.D.   On: 10/16/2023 19:54   DG Chest Portable 1 View Result Date: 10/16/2023 CLINICAL DATA:  Shortness of breath EXAM: PORTABLE CHEST 1 VIEW COMPARISON:  09/10/2023 FINDINGS: Cardiac shadow is within normal limits. Aortic calcifications are noted. Lungs are well aerated bilaterally. Minimal right basilar atelectasis is noted. No bony abnormality is seen. IMPRESSION: Minimal right basilar atelectasis. Electronically Signed   By: Violeta Grey M.D.   On: 10/16/2023 11:04   US  RENAL Result Date: 09/30/2023 CLINICAL DATA:  Right flank pain. History of urinary retention and right hydronephrosis. EXAM: RENAL / URINARY TRACT ULTRASOUND COMPLETE COMPARISON:  CT abdomen pelvis with contrast 09/16/2023 FINDINGS: Right Kidney: Renal measurements: 10.3 cm = volume: 134 mL. No hydronephrosis. Echogenicity within normal limits. 2 simple right renal cysts are seen measuring up to 2.5 cm. The simple cysts do not require further dedicated imaging surveillance. Left Kidney: Renal measurements: 8.8 cm = volume: 9 9 mL. Echogenicity within normal limits. 1.6 cm simple cyst  does not require dedicated imaging surveillance. Bladder: 2.4 cm diverticulum again noted in the left posterolateral bladder wall. Bladder otherwise normal. Other: None. IMPRESSION: 1. No hydronephrosis. 2. No significant sonographic abnormality of the kidneys. Electronically Signed   By: Elester Grim M.D.   On: 09/30/2023 09:45   MR THORACIC SPINE W WO CONTRAST Result Date: 09/18/2023 CLINICAL DATA:  Mid back pain with compression fracture EXAM: MRI THORACIC WITHOUT AND WITH CONTRAST TECHNIQUE: Multiplanar and multiecho pulse sequences of the thoracic spine were obtained without and with intravenous contrast. CONTRAST:  8mL GADAVIST  GADOBUTROL  1 MMOL/ML IV SOLN COMPARISON:  CTA chest 09/13/2023 FINDINGS: Alignment:  Physiologic. Vertebrae: T7 burst fracture with approximately 50% stenosis and 3 mm retropulsion. Moderate bone marrow edema. No other osseous abnormality. Cord:  Normal signal and morphology. Paraspinal and other soft tissues: Negative. Disc levels: No spinal canal stenosis. IMPRESSION: Acute T7 burst fracture with approximately 50% stenosis and 3 mm retropulsion. Electronically Signed   By: Juanetta Nordmann M.D.   On: 09/18/2023 00:25    ECHO ordered  TELEMETRY reviewed by me 10/17/2023: sinus rhythm, PVCs rate 90s    EKG reviewed by me: sinus rhythm frequent PVCs rate 101 bpm  Data reviewed by me 10/17/2023: last 24h vitals tele labs imaging I/O ED provider note, admission H&P  Principal Problem:   CHF (congestive heart failure) (HCC) Active Problems:   COPD (chronic obstructive pulmonary disease) (HCC)   Acute respiratory failure with hypoxia (HCC)   Acute on chronic diastolic CHF (congestive heart failure) (HCC)    ASSESSMENT AND PLAN:  Ruben Green is a 89 y.o. male  with a past medical history of chronic HFpEF, carotid stenosis s/p right CEA 2021, AAA s/p repair 2005, coronary atherosclerosis (reported cardiac cath several years ago), hypertension who presented to the ED on  10/16/2023 for SOB and LE edema. Cardiology was consulted for further evaluation.   # Acute on chronic  HFpEF # Frequent PVCs # Coronary artery disease # Chronic kidney disease stage IIIb Patient reports to the ED with worsening SOB and lower extremity edema. Patient has nighttime O2 requirement at home only. Troponins trending 60 > 96 > 105. EKG in ED with sinus rhythm, frequent PVCs, RBBB at 101 bpm. BNP 216 > 284 (BNP overall improved from 09/16/2023, at 1224 during previous hospitalization).  S/p 1 dose of IV Lasix  40 mg in ED. Cr this AM 1.84 (appears to be at baseline). BP and HR are stable this AM. -Echo ordered.  -Continue IV lasix  40 mg daily. Takes torsemide  10 mg daily at home. -Strict I's & O's. Continue to closely monitor renal function. -Resume home jardiance  10 mg daily.  -Hold home metoprolol  succinate 75 mg daily, consider resuming as HF exacerbation improves. Consider addition of spironolactone if renal function remains stable.  -Continue atorvastatin  40 mg daily.  -Troponins elevated in the setting of Acute on chronic HFpEF most consistent with demand/supply mismatch and not ACS.  This patient's plan of care was discussed and created with Dr. Parks Bollman and he is in agreement.  Signed: Hamp Levine, PA-C  10/17/2023, 10:22 AM [facility]

## 2023-10-17 NOTE — Progress Notes (Signed)
*  PRELIMINARY RESULTS* Echocardiogram 2D Echocardiogram has been performed.  Ruben Green 10/17/2023, 1:49 PM

## 2023-10-17 NOTE — Progress Notes (Signed)
*  PRELIMINARY RESULTS* Echocardiogram 2D Echocardiogram has been performed.  Broadus Canes 10/17/2023, 1:49 PM

## 2023-10-18 ENCOUNTER — Encounter: Payer: Self-pay | Admitting: Oncology

## 2023-10-18 ENCOUNTER — Other Ambulatory Visit (HOSPITAL_COMMUNITY): Payer: Self-pay

## 2023-10-18 ENCOUNTER — Other Ambulatory Visit: Payer: Self-pay

## 2023-10-18 ENCOUNTER — Telehealth: Payer: Self-pay | Admitting: Pharmacist

## 2023-10-18 DIAGNOSIS — E871 Hypo-osmolality and hyponatremia: Secondary | ICD-10-CM | POA: Diagnosis not present

## 2023-10-18 DIAGNOSIS — Z961 Presence of intraocular lens: Secondary | ICD-10-CM | POA: Diagnosis not present

## 2023-10-18 DIAGNOSIS — F419 Anxiety disorder, unspecified: Secondary | ICD-10-CM | POA: Diagnosis not present

## 2023-10-18 DIAGNOSIS — I130 Hypertensive heart and chronic kidney disease with heart failure and stage 1 through stage 4 chronic kidney disease, or unspecified chronic kidney disease: Secondary | ICD-10-CM | POA: Diagnosis not present

## 2023-10-18 DIAGNOSIS — M4854XA Collapsed vertebra, not elsewhere classified, thoracic region, initial encounter for fracture: Secondary | ICD-10-CM | POA: Diagnosis not present

## 2023-10-18 DIAGNOSIS — N401 Enlarged prostate with lower urinary tract symptoms: Secondary | ICD-10-CM | POA: Diagnosis not present

## 2023-10-18 DIAGNOSIS — I2489 Other forms of acute ischemic heart disease: Secondary | ICD-10-CM | POA: Diagnosis not present

## 2023-10-18 DIAGNOSIS — I509 Heart failure, unspecified: Secondary | ICD-10-CM | POA: Diagnosis present

## 2023-10-18 DIAGNOSIS — E785 Hyperlipidemia, unspecified: Secondary | ICD-10-CM | POA: Diagnosis not present

## 2023-10-18 DIAGNOSIS — N138 Other obstructive and reflux uropathy: Secondary | ICD-10-CM | POA: Diagnosis not present

## 2023-10-18 DIAGNOSIS — M1990 Unspecified osteoarthritis, unspecified site: Secondary | ICD-10-CM | POA: Diagnosis not present

## 2023-10-18 DIAGNOSIS — H3530 Unspecified macular degeneration: Secondary | ICD-10-CM | POA: Diagnosis not present

## 2023-10-18 DIAGNOSIS — R5381 Other malaise: Secondary | ICD-10-CM | POA: Diagnosis not present

## 2023-10-18 DIAGNOSIS — Z96641 Presence of right artificial hip joint: Secondary | ICD-10-CM | POA: Diagnosis not present

## 2023-10-18 DIAGNOSIS — I4510 Unspecified right bundle-branch block: Secondary | ICD-10-CM | POA: Diagnosis not present

## 2023-10-18 DIAGNOSIS — I6521 Occlusion and stenosis of right carotid artery: Secondary | ICD-10-CM | POA: Diagnosis not present

## 2023-10-18 DIAGNOSIS — I493 Ventricular premature depolarization: Secondary | ICD-10-CM | POA: Diagnosis not present

## 2023-10-18 DIAGNOSIS — J9621 Acute and chronic respiratory failure with hypoxia: Secondary | ICD-10-CM | POA: Diagnosis not present

## 2023-10-18 DIAGNOSIS — I5033 Acute on chronic diastolic (congestive) heart failure: Secondary | ICD-10-CM | POA: Diagnosis not present

## 2023-10-18 DIAGNOSIS — E538 Deficiency of other specified B group vitamins: Secondary | ICD-10-CM | POA: Diagnosis not present

## 2023-10-18 DIAGNOSIS — I2510 Atherosclerotic heart disease of native coronary artery without angina pectoris: Secondary | ICD-10-CM | POA: Diagnosis not present

## 2023-10-18 DIAGNOSIS — Z7984 Long term (current) use of oral hypoglycemic drugs: Secondary | ICD-10-CM | POA: Diagnosis not present

## 2023-10-18 DIAGNOSIS — Z8616 Personal history of COVID-19: Secondary | ICD-10-CM | POA: Diagnosis not present

## 2023-10-18 DIAGNOSIS — N1832 Chronic kidney disease, stage 3b: Secondary | ICD-10-CM | POA: Diagnosis not present

## 2023-10-18 DIAGNOSIS — J449 Chronic obstructive pulmonary disease, unspecified: Secondary | ICD-10-CM | POA: Diagnosis not present

## 2023-10-18 LAB — ECHOCARDIOGRAM COMPLETE
AR max vel: 2.26 cm2
AV Area VTI: 2.67 cm2
AV Area mean vel: 2.05 cm2
AV Mean grad: 2 mmHg
AV Peak grad: 4.2 mmHg
Ao pk vel: 1.02 m/s
Area-P 1/2: 4.49 cm2
S' Lateral: 3.1 cm

## 2023-10-18 LAB — HEMOGLOBIN A1C
Hgb A1c MFr Bld: 6.8 % — ABNORMAL HIGH (ref 4.8–5.6)
Mean Plasma Glucose: 148.46 mg/dL

## 2023-10-18 LAB — BASIC METABOLIC PANEL WITH GFR
Anion gap: 11 (ref 5–15)
BUN: 60 mg/dL — ABNORMAL HIGH (ref 8–23)
CO2: 25 mmol/L (ref 22–32)
Calcium: 9.2 mg/dL (ref 8.9–10.3)
Chloride: 98 mmol/L (ref 98–111)
Creatinine, Ser: 1.7 mg/dL — ABNORMAL HIGH (ref 0.61–1.24)
GFR, Estimated: 38 mL/min — ABNORMAL LOW (ref >60)
Glucose, Bld: 122 mg/dL — ABNORMAL HIGH (ref 70–99)
Potassium: 3.9 mmol/L (ref 3.5–5.1)
Sodium: 134 mmol/L — ABNORMAL LOW (ref 135–145)

## 2023-10-18 MED ORDER — METOPROLOL SUCCINATE ER 50 MG PO TB24
50.0000 mg | ORAL_TABLET | Freq: Every day | ORAL | 3 refills | Status: AC
  Filled 2023-10-18: qty 1, 1d supply, fill #0

## 2023-10-18 MED ORDER — TORSEMIDE 10 MG PO TABS
10.0000 mg | ORAL_TABLET | Freq: Every day | ORAL | 11 refills | Status: AC
Start: 2023-10-18 — End: 2024-10-17
  Filled 2023-10-18: qty 30, 30d supply, fill #0

## 2023-10-18 MED ORDER — POLYSACCHARIDE IRON COMPLEX 150 MG PO CAPS
150.0000 mg | ORAL_CAPSULE | Freq: Every day | ORAL | 3 refills | Status: DC
  Filled 2023-10-18 (×2): qty 60, 60d supply, fill #0

## 2023-10-18 MED ORDER — TAMSULOSIN HCL 0.4 MG PO CAPS
0.4000 mg | ORAL_CAPSULE | Freq: Every day | ORAL | 3 refills | Status: AC
  Filled 2023-10-18 (×2): qty 30, 30d supply, fill #0

## 2023-10-18 MED ORDER — EMPAGLIFLOZIN 10 MG PO TABS
10.0000 mg | ORAL_TABLET | Freq: Every day | ORAL | 11 refills | Status: AC
  Filled 2023-10-18 (×2): qty 30, 30d supply, fill #0

## 2023-10-18 MED ORDER — METOPROLOL SUCCINATE ER 50 MG PO TB24
50.0000 mg | ORAL_TABLET | Freq: Every day | ORAL | Status: DC
  Administered 2023-10-18 – 2023-10-20 (×3): 50 mg via ORAL
  Filled 2023-10-18 (×3): qty 1

## 2023-10-18 MED ORDER — ATORVASTATIN CALCIUM 40 MG PO TABS
40.0000 mg | ORAL_TABLET | Freq: Every day | ORAL | 3 refills | Status: AC
  Filled 2023-10-18 (×2): qty 30, 30d supply, fill #0

## 2023-10-18 NOTE — Evaluation (Signed)
 Physical Therapy Evaluation Patient Details Name: Ruben Green MRN: [id number] DOB: 1934-04-14 Today's Date: 10/18/2023  History of Present Illness  Pt is an 89 y.o. male with medical history significant of HTN, HDL, COPD, CAD, dCHf, anxiety, BPH, CKD 3A, bladder cancer (s/p of BCG treatment), hard of hearing, right bundle blockade, kidney stone, prostate cancer, AAA (s/p of repair), recent fall with T7 compression fracture, who presented to ED for SOB, leg swelling, workup for CHF exacerbation.   Clinical Impression  Patient alert, agreeable to PT, up in bathroom independently. Able to perform pericare and hand washing modI, transfers modI. Able to ambulate >339ft without AD, no LOB. spO2 on room air throughout, spO2 >90% on room air, HR 110-120s. Pt appears to be near baseline, but would benefit from further skilled PT intervention to maximize activity tolerance/endurance and to establish a HEP.         If plan is discharge home, recommend the following: Help with stairs or ramp for entrance;Assist for transportation   Can travel by private vehicle        Equipment Recommendations None recommended by PT  Recommendations for Other Services       Functional Status Assessment  (pt near baseline, but could benefit from activity tolerance/endurance exercises/education to maximize safety)     Precautions / Restrictions Precautions Precautions: Fall Recall of Precautions/Restrictions: Intact Required Braces or Orthoses: Other Brace Other Brace: TLSO, wife said he only needs to wear it if he's going to do some bending Restrictions Weight Bearing Restrictions Per Provider Order: No      Mobility  Bed Mobility               General bed mobility comments: up in bathroom upon PT arrival, independently    Transfers Overall transfer level: Modified independent                 General transfer comment: grab bar in bathroom, modI at EOB     Ambulation/Gait Ambulation/Gait assistance: Modified independent (Device/Increase time) Gait Distance (Feet): 350 Feet Assistive device: None         General Gait Details: no LOB, encouragement to ambulate more, spO2 on room air 90% or greater throughout  Stairs            Wheelchair Mobility     Tilt Bed    Modified Rankin (Stroke Patients Only)       Balance Overall balance assessment: Modified Independent Sitting-balance support: Feet supported Sitting balance-Leahy Scale: Normal Sitting balance - Comments: pericare independently     Standing balance-Leahy Scale: Good Standing balance comment: washed hands at sink independently                             Pertinent Vitals/Pain Pain Assessment Pain Assessment: Faces Faces Pain Scale: No hurt    Home Living Family/patient expects to be discharged to:: Private residence Living Arrangements: Spouse/significant other Available Help at Discharge: Family;Available 24 hours/day Type of Home: House Home Access: Stairs to enter Entrance Stairs-Rails: Right Entrance Stairs-Number of Steps: 2   Home Layout: One level Home Equipment: Shower seat - built Charity fundraiser (2 wheels) Additional Comments: wears O2 only at night    Prior Function Prior Level of Function : Independent/Modified Independent;Driving;History of Falls (last six months)             Mobility Comments: no AD use. IND; no falls in the last month  Extremity/Trunk Assessment   Upper Extremity Assessment Upper Extremity Assessment: Overall WFL for tasks assessed    Lower Extremity Assessment Lower Extremity Assessment: Generalized weakness       Communication   Communication Factors Affecting Communication: Hearing impaired (pt speaks/understands english to a point, wife assists as needed)    Cognition Arousal: Alert Behavior During Therapy: WFL for tasks assessed/performed   PT - Cognitive impairments:  No apparent impairments                         Following commands: Intact       Cueing       General Comments      Exercises     Assessment/Plan    PT Assessment Patient needs continued PT services  PT Problem List Decreased activity tolerance;Decreased mobility       PT Treatment Interventions Balance training;DME instruction;Gait training;Neuromuscular re-education;Stair training;Functional mobility training;Patient/family education;Therapeutic activities;Therapeutic exercise    PT Goals (Current goals can be found in the Care Plan section)  Acute Rehab PT Goals Patient Stated Goal: to go home PT Goal Formulation: With patient Time For Goal Achievement: 11/01/23 Potential to Achieve Goals: Good    Frequency Min 2X/week     Co-evaluation               AM-PAC PT "6 Clicks" Mobility  Outcome Measure Help needed turning from your back to your side while in a flat bed without using bedrails?: None Help needed moving from lying on your back to sitting on the side of a flat bed without using bedrails?: None Help needed moving to and from a bed to a chair (including a wheelchair)?: None Help needed standing up from a chair using your arms (e.g., wheelchair or bedside chair)?: None Help needed to walk in hospital room?: None Help needed climbing 3-5 steps with a railing? : A Little 6 Click Score: 23    End of Session   Activity Tolerance: Patient tolerated treatment well Patient left: Other (comment) (seated EOB with family)   PT Visit Diagnosis: Muscle weakness (generalized) (M62.81)    Time: 1610-9604 PT Time Calculation (min) (ACUTE ONLY): 13 min   Charges:   PT Evaluation $PT Eval Low Complexity: 1 Low PT Treatments $Therapeutic Activity: 8-22 mins PT General Charges $$ ACUTE PT VISIT: 1 Visit         Darien Eden PT, DPT 10:04 AM,10/18/23

## 2023-10-18 NOTE — Telephone Encounter (Signed)
 Discussed with patient. Patient believes that pill packing will be very helpful in improving medication adherence. Discussed with Hamp Levine and maintenance medications were sent to [facility]. Will help in initiation of pill pack services.

## 2023-10-18 NOTE — Evaluation (Signed)
 Occupational Therapy Evaluation Patient Details Name: Ruben Green MRN: [id number] DOB: 05-24-1934 Today's Date: 10/18/2023   History of Present Illness   Pt is an 89 y.o. male with medical history significant of HTN, HDL, COPD, CAD, dCHf, anxiety, BPH, CKD 3A, bladder cancer (s/p of BCG treatment), hard of hearing, right bundle blockade, kidney stone, prostate cancer, AAA (s/p of repair), recent fall with T7 compression fracture, who presented to ED for SOB, leg swelling, workup for CHF exacerbation.     Clinical Impressions Upon entering the room, pt supine in bed and agreeable to OT evaluation. Pt reports being Ind at home and living with wife. He does have TLSO he wears with wife's assistance to don if walking quite a bit or bending often. Pt demonstrates mod I in room without use of AD for bed mobility and to stand at sink for grooming tasks. Pt endorses being at baseline at this time with no concerns. He returns back to bed at end of session with all needs within reach. OT to complete orders at this time.       Functional Status Assessment   Patient has not had a recent decline in their functional status     Equipment Recommendations   None recommended by OT      Precautions/Restrictions   Precautions Precautions: Fall Recall of Precautions/Restrictions: Intact Required Braces or Orthoses: Other Brace Other Brace: TLSO, wife said he only needs to wear it if he's going to do some bending Restrictions Weight Bearing Restrictions Per Provider Order: No     Mobility Bed Mobility Overal bed mobility: Modified Independent                  Transfers Overall transfer level: Modified independent                        Balance Overall balance assessment: Modified Independent Sitting-balance support: Feet supported Sitting balance-Leahy Scale: Normal       Standing balance-Leahy Scale: Good                             ADL either  performed or assessed with clinical judgement   ADL Overall ADL's : Modified independent                                             Vision Patient Visual Report: No change from baseline              Pertinent Vitals/Pain Pain Assessment Pain Assessment: Faces Faces Pain Scale: No hurt     Extremity/Trunk Assessment Upper Extremity Assessment Upper Extremity Assessment: Overall WFL for tasks assessed   Lower Extremity Assessment Lower Extremity Assessment: Generalized weakness       Communication Communication Communication: Impaired Factors Affecting Communication: Hearing impaired   Cognition Arousal: Alert Behavior During Therapy: WFL for tasks assessed/performed Cognition: No apparent impairments                               Following commands: Intact       Cueing  General Comments   Cueing Techniques: Verbal cues              Home Living Family/patient expects to be discharged to:: Private residence Living Arrangements:  Spouse/significant other Available Help at Discharge: Family;Available 24 hours/day Type of Home: House Home Access: Stairs to enter [employer] of Steps: 2 Entrance Stairs-Rails: Right Home Layout: One level     Bathroom Shower/Tub: Producer, television/film/video: Standard     Home Equipment: Shower seat - built [employer] fundraiser (2 wheels)   Additional Comments: wears O2 only at night      Prior Functioning/Environment Prior Level of Function : Independent/Modified Independent;Driving;History of Falls (last six months)             Mobility Comments: no AD use. IND; no falls in the last month ADLs Comments: IND     AM-PAC OT "6 Clicks" Daily Activity     Outcome Measure Help from another person eating meals?: None Help from another person taking care of personal grooming?: None Help from another person toileting, which includes using toliet, bedpan, or urinal?:  None Help from another person bathing (including washing, rinsing, drying)?: None Help from another person to put on and taking off regular upper body clothing?: None Help from another person to put on and taking off regular lower body clothing?: None 6 Click Score: 24   End of Session Nurse Communication: Mobility status  Activity Tolerance: Patient tolerated treatment well Patient left: in bed;with call bell/phone within reach                   Time: 1025-1046 OT Time Calculation (min): 21 min Charges:  OT General Charges $OT Visit: 1 Visit OT Evaluation $OT Eval Low Complexity: 1 Low  George Kinder, MS, OTR/L , CBIS ascom [phone]  10/18/23, 1:52 PM

## 2023-10-18 NOTE — Progress Notes (Signed)
 PROGRESS NOTE    Ruben Green  ZOX:[id number] DOB: 02-13-1934 DOA: 10/16/2023 PCP: Antonio Baumgarten, MD   Assessment & Plan:   Principal Problem:   CHF (congestive heart failure) (HCC) Active Problems:   Acute respiratory failure with hypoxia (HCC)   Acute on chronic diastolic CHF (congestive heart failure) (HCC)   COPD (chronic obstructive pulmonary disease) (HCC)  Assessment and Plan:  Acute on chronic hypoxic respiratory failure: likely secondary to CHF exacerbation. Continue on supplemental oxygen  and currently back at baseline of 3L [place]   Acute on chronic diastolic CHF: continue on IV lasix  & start metoprolol . Monitor I/Os. Echo shows EF 60-65%, grade I diastolic dysfunction, no regional wall motion abnormalities. Needs repeat CHF education. Cardio following and recs apprec    Elevated troponins: likely secondary to demand ischemia    COPD: w/o exacerbation. Continue on bronchodilators    CKDIIIa: Cr is trending down. Avoid nephrotoxic meds    Chronic hyponatremia: almost WNL today    Hx of CAD: was not on GDMT as per med rec.    Chronic compression fracture of T7 vertebra: continue on home dose of tramadol , robaxin  prn. Continue w/ TLSO brace       DVT prophylaxis: heparin  Code Status: full  Family Communication:  Disposition Plan: likely d/c back home  Level of care: Telemetry Cardiac  Status is: Inpatient Remains inpatient appropriate because: requiring IV lasix       Consultants:  Cardio   Procedures:   Antimicrobials:    Subjective: Pt c/o malaise  Objective: Vitals:   10/17/23 1935 10/17/23 2340 10/18/23 0344 10/18/23 0537  BP: 133/60 (!) 141/73 127/67   Pulse: 100 99 90   Resp: 18 20 18    Temp: 97.6 F (36.4 C) 97.7 F (36.5 C) 97.7 F (36.5 C)   TempSrc: Oral Oral Oral   SpO2: 93% 93% 94%   Weight:    65.2 kg  Height:    5\' 1"  (1.549 m)    Intake/Output Summary (Last 24 hours) at 10/18/2023 0847 Last data filed at 10/17/2023  2300 Gross per 24 hour  Intake 240 ml  Output 250 ml  Net -10 ml   Filed Weights   10/18/23 0537  Weight: 65.2 kg    Examination:  General exam: Appears comfortable  Respiratory system: diminished breath sounds b/l  Cardiovascular system: S1/S2+. No rubs or gallops 2+ pitting edema b/l LE Gastrointestinal system: Abd is soft, NT, ND & hypoactive bowel sounds  Central nervous system: alert & awake. Moves all extremities  Psychiatry: Judgement and insight appears at baseline. Flat mood and affect    Data Reviewed: I have personally reviewed following labs and imaging studies  CBC: Recent Labs  Lab 10/16/23 1044  WBC 7.2  HGB 11.3*  HCT 35.2*  MCV 96.4  PLT 156   Basic Metabolic Panel: Recent Labs  Lab 10/16/23 1044 10/17/23 0439 10/18/23 0542  NA 130* 133* 134*  K 3.7 3.9 3.9  CL 98 99 98  CO2 25 26 25   GLUCOSE 142* 204* 122*  BUN 52* 61* 60*  CREATININE 1.84* 1.84* 1.70*  CALCIUM  8.5* 8.8* 9.2   GFR: Estimated Creatinine Clearance: 24.4 mL/min (A) (by C-G formula based on SCr of 1.7 mg/dL (H)). Liver Function Tests: No results for input(s): "AST", "ALT", "ALKPHOS", "BILITOT", "PROT", "ALBUMIN" in the last 168 hours. No results for input(s): "LIPASE", "AMYLASE" in the last 168 hours. No results for input(s): "AMMONIA" in the last 168 hours. Coagulation Profile: No results for  input(s): "INR", "PROTIME" in the last 168 hours. Cardiac Enzymes: No results for input(s): "CKTOTAL", "CKMB", "CKMBINDEX", "TROPONINI" in the last 168 hours. BNP (last 3 results) No results for input(s): "PROBNP" in the last 8760 hours. HbA1C: No results for input(s): "HGBA1C" in the last 72 hours. CBG: No results for input(s): "GLUCAP" in the last 168 hours. Lipid Profile: No results for input(s): "CHOL", "HDL", "LDLCALC", "TRIG", "CHOLHDL", "LDLDIRECT" in the last 72 hours. Thyroid  Function Tests: No results for input(s): "TSH", "T4TOTAL", "FREET4", "T3FREE", "THYROIDAB" in  the last 72 hours. Anemia Panel: No results for input(s): "VITAMINB12", "FOLATE", "FERRITIN", "TIBC", "IRON ", "RETICCTPCT" in the last 72 hours. Sepsis Labs: No results for input(s): "PROCALCITON", "LATICACIDVEN" in the last 168 hours.  Recent Results (from the past 240 hours)  Respiratory (~20 pathogens) panel by PCR     Status: None   Collection Time: 10/16/23  4:14 PM   Specimen: Nasopharyngeal Swab; Respiratory  Result Value Ref Range Status   Adenovirus NOT DETECTED NOT DETECTED Final   Coronavirus 229E NOT DETECTED NOT DETECTED Final    Comment: (NOTE) The Coronavirus on the Respiratory Panel, DOES NOT test for the novel  Coronavirus (2019 nCoV)    Coronavirus HKU1 NOT DETECTED NOT DETECTED Final   Coronavirus NL63 NOT DETECTED NOT DETECTED Final   Coronavirus OC43 NOT DETECTED NOT DETECTED Final   Metapneumovirus NOT DETECTED NOT DETECTED Final   Rhinovirus / Enterovirus NOT DETECTED NOT DETECTED Final   Influenza A NOT DETECTED NOT DETECTED Final   Influenza B NOT DETECTED NOT DETECTED Final   Parainfluenza Virus 1 NOT DETECTED NOT DETECTED Final   Parainfluenza Virus 2 NOT DETECTED NOT DETECTED Final   Parainfluenza Virus 3 NOT DETECTED NOT DETECTED Final   Parainfluenza Virus 4 NOT DETECTED NOT DETECTED Final   Respiratory Syncytial Virus NOT DETECTED NOT DETECTED Final   Bordetella pertussis NOT DETECTED NOT DETECTED Final   Bordetella Parapertussis NOT DETECTED NOT DETECTED Final   Chlamydophila pneumoniae NOT DETECTED NOT DETECTED Final   Mycoplasma pneumoniae NOT DETECTED NOT DETECTED Final    Comment: Performed at [facility], [address] [place], [place] [address]         Radiology Studies: US  Venous Img Lower Bilateral (DVT) Result Date: 10/16/2023 CLINICAL DATA:  Swelling for 3-4 months EXAM: BILATERAL LOWER EXTREMITY VENOUS DOPPLER ULTRASOUND TECHNIQUE: Gray-scale sonography with compression, as well as color and duplex ultrasound, were  performed to evaluate the deep venous system(s) from the level of the common femoral vein through the popliteal and proximal calf veins. COMPARISON:  Lower extremity ultrasound 03/24/2023 FINDINGS: VENOUS Normal compressibility of the common femoral, superficial femoral, and popliteal veins, as well as the visualized calf veins. Visualized portions of profunda femoral vein and great saphenous vein unremarkable. No filling defects to suggest DVT on grayscale or color Doppler imaging. Doppler waveforms show normal direction of venous flow, normal respiratory plasticity and response to augmentation. Limited views of the contralateral common femoral vein are unremarkable. OTHER None. Limitations: none IMPRESSION: Negative. Electronically Signed   By: Rozell Cornet M.D.   On: 10/16/2023 19:54   DG Chest Portable 1 View Result Date: 10/16/2023 CLINICAL DATA:  Shortness of breath EXAM: PORTABLE CHEST 1 VIEW COMPARISON:  09/10/2023 FINDINGS: Cardiac shadow is within normal limits. Aortic calcifications are noted. Lungs are well aerated bilaterally. Minimal right basilar atelectasis is noted. No bony abnormality is seen. IMPRESSION: Minimal right basilar atelectasis. Electronically Signed   By: Regenia Cape.D.  On: 10/16/2023 11:04        Scheduled Meds:  atorvastatin   40 mg Oral Daily   budeson-glycopyrrolate -formoterol   2 puff Inhalation BID   empagliflozin   10 mg Oral QAC breakfast   furosemide   40 mg Intravenous Daily   heparin   5,000 Units Subcutaneous Q12H   metoprolol  succinate  50 mg Oral Daily   sodium chloride  flush  3 mL Intravenous Q12H   tamsulosin   0.4 mg Oral Daily   Continuous Infusions:     LOS: 0 days     Alphonsus Jeans, MD [facility] Pager [phone]  If 7PM-7AM, please contact night-coverage [email or web address] 10/18/2023, 8:47 AM

## 2023-10-18 NOTE — Plan of Care (Signed)

## 2023-10-18 NOTE — Progress Notes (Cosign Needed Addendum)
 [facility] CLINIC CARDIOLOGY PROGRESS NOTE       Patient ID: Ruben Green MRN: [id number] DOB/AGE: 1934/03/31 89 y.o.  Admit date: 10/16/2023 Referring Physician Dr. Deena Farrier Primary Physician Antonio Baumgarten, MD  Primary Cardiologist Guillermo Lees, NP Reason for Consultation AoCHF  HPI: Ruben Green is a 89 y.o. male  with a past medical history of chronic HFpEF, carotid stenosis s/p right CEA 2021, AAA s/p repair 2005, coronary atherosclerosis (reported cardiac cath several years ago), hypertension who presented to the ED on 10/16/2023 for SOB and LE edema. Cardiology was consulted for further evaluation.   Interval history: -Patient seen and examined this AM, resting comfortably in hospital bed with family at bedside.  -Denies any SOB, remains on supplemental O2. -LE edema improving, renal function improved today.  -Frequent PVCs on tele.   Review of systems complete and found to be negative unless listed above    Past Medical History:  Diagnosis Date   AAA (abdominal aortic aneurysm) (HCC)    a.) s/p repair in 2005   Anemia    Anginal pain (HCC)    Anxiety    Arthritis    B12 deficiency    Bilateral cataracts    a.) s/p BILATERAL extractions in 2018   BPH with obstruction/lower urinary tract symptoms    CAD (coronary artery disease)    Carotid artery stenosis    a.) s/p CEA on the RIGHT   CHF (congestive heart failure) (HCC)    COPD (chronic obstructive pulmonary disease) (HCC)    Diastolic dysfunction 07/29/2019   a.)  TTE 07/29/2019: EF 50-55%; LA mildly enlarged; G1DD.   DOE (dyspnea on exertion)    Elevated PSA    Environmental and seasonal allergies    History of 2019 novel coronavirus disease (COVID-19)    History of kidney stones    HLD (hyperlipidemia)    HOH (hard of hearing)    HTN (hypertension)    Incomplete bladder emptying    Macular degeneration    Nausea vomiting and diarrhea 09/16/2023   Prostate cancer (HCC)    RBBB  (right bundle branch block)    Valvular insufficiency    a.) TTE 07/29/2019: LVEF 50-55%; LA mild enlarged; triv AR/PR, mild MR, mod TR.    Past Surgical History:  Procedure Laterality Date   ABDOMINAL AORTIC ANEURYSM REPAIR     CATARACT EXTRACTION W/PHACO Right 09/25/2016   Procedure: CATARACT EXTRACTION PHACO AND INTRAOCULAR LENS PLACEMENT (IOC);  Surgeon: Clair Crews, MD;  Location: [facility] ORS;  Service: Ophthalmology;  Laterality: Right;  US  01:01 AP% 17.4 CDE 10.75 Fluid pack lot # [id number]   CATARACT EXTRACTION W/PHACO Left 10/16/2016   Procedure: CATARACT EXTRACTION PHACO AND INTRAOCULAR LENS PLACEMENT (IOC) Suture placed in Left eye;  Surgeon: Clair Crews, MD;  Location: [facility] ORS;  Service: Ophthalmology;  Laterality: Left;  US  2:06.8 AP% 22.2 CDE 28.17 Fluid pack lot # [id number]   COLONOSCOPY WITH PROPOFOL  N/A 03/31/2019   Procedure: COLONOSCOPY WITH PROPOFOL ;  Surgeon: Deveron Fly, MD;  Location[facility] END[facility]COPY;  Service: Endoscopy;  Laterality: N/A;   COLONOSCOPY WITH PROPOFOL  N/A 09/09/2020   Procedure: COLONOSCOPY WITH PROPOFOL ;  Surgeon: Shane Darling, MD;  Location: [facility] ENDOSCOPY;  Service: Endoscopy;  Laterality: N/A;   CYSTOSCOPY W/ RETROGRADES Bilateral 02/23/2020   Procedure: CYSTOSCOPY WITH RETROGRADE PYELOGRAM;  Surgeon: Geraline Knapp, MD;  Location: [facility] ORS;  Service: Urology;  Laterality: Bilateral;   CYSTOSCOPY WITH BIOPSY N/A 02/23/2020   Procedure: CYSTOSCOPY WITH BIOPSY;  Surgeon: Geraline Knapp, MD;  Location: [facility] ORS;  Service: Urology;  Laterality: N/A;   EYE SURGERY     HERNIA REPAIR     x 2   INGUINAL HERNIA REPAIR Right 12/02/2015   Procedure: HERNIA REPAIR INGUINAL ADULT;  Surgeon: Benancio Bracket, MD;  Location: [facility] ORS;  Service: General;  Laterality: Right;   PROSTATE SURGERY  2012   Right Carotid Endarterectomy     TOTAL HIP ARTHROPLASTY Right 08/01/2021   Procedure: TOTAL HIP ARTHROPLASTY;  Surgeon: Elner Hahn, MD;   Location: [facility] ORS;  Service: Orthopedics;  Laterality: Right;    Medications Prior to Admission  Medication Sig Dispense Refill Last Dose/Taking   acetaminophen  (TYLENOL ) 500 MG tablet Take 1-2 tablets (500-1,000 mg total) by mouth every 6 (six) hours as needed for mild pain. 60 tablet 0 10/16/2023   albuterol  (VENTOLIN  HFA) 108 (90 Base) MCG/ACT inhaler Inhale 2 puffs into the lungs every 4 (four) hours as needed. 8 g 0 Taking As Needed   atorvastatin  (LIPITOR) 40 MG tablet Take 40 mg by mouth daily.   Past Week   empagliflozin  (JARDIANCE ) 10 MG TABS tablet Take 1 tablet (10 mg total) by mouth daily before breakfast. 30 tablet 11 10/16/2023   Fluticasone -Umeclidin-Vilant (TRELEGY ELLIPTA) 100-62.5-25 MCG/INH AEPB Inhale 2 puffs into the lungs daily.   10/16/2023   iron  polysaccharides (NIFEREX) 150 MG capsule Take 150 mg by mouth 2 (two) times daily.   Past Week   LORazepam  (ATIVAN ) 0.5 MG tablet Take 0.5 mg by mouth daily as needed for anxiety.   Taking As Needed   methocarbamol  (ROBAXIN ) 500 MG tablet Take 1 tablet (500 mg total) by mouth every 8 (eight) hours as needed. 30 tablet 0 Taking As Needed   metoprolol  succinate (TOPROL -XL) 50 MG 24 hr tablet Take 75 mg by mouth daily.   10/16/2023   tamsulosin  (FLOMAX ) 0.4 MG CAPS capsule Take 0.4 mg by mouth daily.   10/16/2023   torsemide  (DEMADEX ) 10 MG tablet Take 10 mg by mouth daily.   10/16/2023   traMADol  (ULTRAM ) 50 MG tablet Take 1 tablet (50 mg total) by mouth every 4 (four) hours as needed for moderate pain (pain score 4-6) or severe pain (pain score 7-10). 30 tablet 0 Taking As Needed   Social History   Socioeconomic History   Marital status: Married    Spouse name: Not on file   Number of children: Not on file   Years of education: Not on file   Highest education level: Not on file  Occupational History   Not on file  Tobacco Use   Smoking status: Former    Current packs/day: 0.00    Types: Cigarettes    Quit date: 11/30/1998     Years since quitting: 24.8   Smokeless tobacco: Former   Tobacco comments:    quit 10 years ago  Vaping Use   Vaping status: Never Used  Substance and Sexual Activity   Alcohol  use: Not Currently    Comment: occasional   Drug use: No   Sexual activity: Not on file  Other Topics Concern   Not on file  Social History Narrative   Not on file   Social Drivers of Health   Financial Resource Strain: Low Risk  (07/22/2023)   Received from [facility]   Overall Financial Resource Strain (CARDIA)    Difficulty of Paying Living Expenses: Not hard at all  Food Insecurity: No Food Insecurity (10/16/2023)  Hunger Vital Sign    Worried About Running Out of Food in the Last Year: Never true    Ran Out of Food in the Last Year: Never true  Transportation Needs: No Transportation Needs (10/16/2023)   PRAPARE - Administrator, Civil Service (Medical): No    Lack of Transportation (Non-Medical): No  Physical Activity: Unknown (04/23/2017)   Received from [facility], [facility]   Exercise Vital Sign    Days of Exercise per Week: Patient declined    Minutes of Exercise per Session: Patient declined  Stress: Unknown (04/23/2017)   Received from [facility], [facility]   [employer] of Occupational Health - Occupational Stress Questionnaire    Feeling of Stress : Patient declined  Social Connections: Moderately Isolated (10/16/2023)   Social Connection and Isolation Panel [NHANES]    Frequency of Communication with Friends and Family: More than three times a week    Frequency of Social Gatherings with Friends and Family: More than three times a week    Attends Religious Services: Never    Database administrator or Organizations: No    Attends Banker Meetings: Never    Marital Status: Married  Catering manager Violence: Not At Risk (10/16/2023)   Humiliation, Afraid, Rape, and  Kick questionnaire    Fear of Current or Ex-Partner: No    Emotionally Abused: No    Physically Abused: No    Sexually Abused: No    Family History  Problem Relation Age of Onset   Cancer Brother        2000   Prostate cancer Neg Hx    Bladder Cancer Neg Hx    Kidney cancer Neg Hx      Vitals:   10/17/23 1935 10/17/23 2340 10/18/23 0344 10/18/23 0537  BP: 133/60 (!) 141/73 127/67   Pulse: 100 99 90   Resp: 18 20 18    Temp: 97.6 F (36.4 C) 97.7 F (36.5 C) 97.7 F (36.5 C)   TempSrc: Oral Oral Oral   SpO2: 93% 93% 94%   Weight:    65.2 kg  Height:    5\' 1"  (1.549 m)    PHYSICAL EXAM General: Well appearing elderly male, well nourished, in no acute distress. HEENT: Normocephalic and atraumatic. Neck: No JVD.  Lungs: Normal respiratory effort on 3L. Bibasilar crackles.  Heart: HRRR. Normal S1 and S2 without gallops or murmurs.  Abdomen: Non-distended appearing.  Msk: Normal strength and tone for age. Extremities: Warm and well perfused. No clubbing, cyanosis. 1+ pitting edema R > L.  Neuro: Alert and oriented X 3. Psych: Answers questions appropriately.   Labs: Basic Metabolic Panel: Recent Labs    10/17/23 0439 10/18/23 0542  NA 133* 134*  K 3.9 3.9  CL 99 98  CO2 26 25  GLUCOSE 204* 122*  BUN 61* 60*  CREATININE 1.84* 1.70*  CALCIUM  8.8* 9.2   Liver Function Tests: No results for input(s): "AST", "ALT", "ALKPHOS", "BILITOT", "PROT", "ALBUMIN" in the last 72 hours. No results for input(s): "LIPASE", "AMYLASE" in the last 72 hours. CBC: Recent Labs    10/16/23 1044  WBC 7.2  HGB 11.3*  HCT 35.2*  MCV 96.4  PLT 156   Cardiac Enzymes: Recent Labs    10/16/23 1044 10/16/23 1346 10/16/23 1614  TROPONINIHS 60* 96* 105*   BNP: Recent Labs    10/16/23 1044  BNP 287.4*   D-Dimer: No results  for input(s): "DDIMER" in the last 72 hours. Hemoglobin A1C: No results for input(s): "HGBA1C" in the last 72 hours. Fasting Lipid Panel: No results  for input(s): "CHOL", "HDL", "LDLCALC", "TRIG", "CHOLHDL", "LDLDIRECT" in the last 72 hours. Thyroid  Function Tests: No results for input(s): "TSH", "T4TOTAL", "T3FREE", "THYROIDAB" in the last 72 hours.  Invalid input(s): "FREET3" Anemia Panel: No results for input(s): "VITAMINB12", "FOLATE", "FERRITIN", "TIBC", "IRON ", "RETICCTPCT" in the last 72 hours.   Radiology: US  Venous Img Lower Bilateral (DVT) Result Date: 10/16/2023 CLINICAL DATA:  Swelling for 3-4 months EXAM: BILATERAL LOWER EXTREMITY VENOUS DOPPLER ULTRASOUND TECHNIQUE: Gray-scale sonography with compression, as well as color and duplex ultrasound, were performed to evaluate the deep venous system(s) from the level of the common femoral vein through the popliteal and proximal calf veins. COMPARISON:  Lower extremity ultrasound 03/24/2023 FINDINGS: VENOUS Normal compressibility of the common femoral, superficial femoral, and popliteal veins, as well as the visualized calf veins. Visualized portions of profunda femoral vein and great saphenous vein unremarkable. No filling defects to suggest DVT on grayscale or color Doppler imaging. Doppler waveforms show normal direction of venous flow, normal respiratory plasticity and response to augmentation. Limited views of the contralateral common femoral vein are unremarkable. OTHER None. Limitations: none IMPRESSION: Negative. Electronically Signed   By: Rozell Cornet M.D.   On: 10/16/2023 19:54   DG Chest Portable 1 View Result Date: 10/16/2023 CLINICAL DATA:  Shortness of breath EXAM: PORTABLE CHEST 1 VIEW COMPARISON:  09/10/2023 FINDINGS: Cardiac shadow is within normal limits. Aortic calcifications are noted. Lungs are well aerated bilaterally. Minimal right basilar atelectasis is noted. No bony abnormality is seen. IMPRESSION: Minimal right basilar atelectasis. Electronically Signed   By: Violeta Grey M.D.   On: 10/16/2023 11:04   US  RENAL Result Date: 09/30/2023 CLINICAL DATA:  Right  flank pain. History of urinary retention and right hydronephrosis. EXAM: RENAL / URINARY TRACT ULTRASOUND COMPLETE COMPARISON:  CT abdomen pelvis with contrast 09/16/2023 FINDINGS: Right Kidney: Renal measurements: 10.3 cm = volume: 134 mL. No hydronephrosis. Echogenicity within normal limits. 2 simple right renal cysts are seen measuring up to 2.5 cm. The simple cysts do not require further dedicated imaging surveillance. Left Kidney: Renal measurements: 8.8 cm = volume: 9 9 mL. Echogenicity within normal limits. 1.6 cm simple cyst does not require dedicated imaging surveillance. Bladder: 2.4 cm diverticulum again noted in the left posterolateral bladder wall. Bladder otherwise normal. Other: None. IMPRESSION: 1. No hydronephrosis. 2. No significant sonographic abnormality of the kidneys. Electronically Signed   By: Elester Grim M.D.   On: 09/30/2023 09:45    ECHO pending  TELEMETRY reviewed by me 10/18/2023: sinus rhythm, PVCs rate 90s    EKG reviewed by me: sinus rhythm frequent PVCs rate 101 bpm  Data reviewed by me 10/18/2023: last 24h vitals tele labs imaging I/O hospitalist progress note  Principal Problem:   CHF (congestive heart failure) (HCC) Active Problems:   COPD (chronic obstructive pulmonary disease) (HCC)   Acute respiratory failure with hypoxia (HCC)   Acute on chronic diastolic CHF (congestive heart failure) (HCC)    ASSESSMENT AND PLAN:  Ruben Green is a 89 y.o. male  with a past medical history of chronic HFpEF, carotid stenosis s/p right CEA 2021, AAA s/p repair 2005, coronary atherosclerosis (reported cardiac cath several years ago), hypertension who presented to the ED on 10/16/2023 for SOB and LE edema. Cardiology was consulted for further evaluation.   # Acute on chronic HFpEF # Frequent PVCs #  Coronary artery disease # Chronic kidney disease stage IIIb Patient reports to the ED with worsening SOB and lower extremity edema. Patient has nighttime O2 requirement at  home only. Troponins trending 60 > 96 > 105. EKG in ED with sinus rhythm, frequent PVCs, RBBB at 101 bpm. BNP 216 > 284 (BNP overall improved from 09/16/2023, at 1224 during previous hospitalization).  S/p 1 dose of IV Lasix  40 mg in ED. Cr this AM 1.84 (appears to be at baseline). BP and HR are stable this AM. -Echo pending.  -Continue IV lasix  40 mg daily. Takes torsemide  10 mg daily at home. -Strict I's & O's. Continue to closely monitor renal function. -Continue home jardiance  10 mg daily.  -Resume home metoprolol  succinate 50 mg daily. Consider addition of spironolactone if renal function remains stable.  -Continue atorvastatin  40 mg daily.  -Troponins elevated in the setting of Acute on chronic HFpEF most consistent with demand/supply mismatch and not ACS.  Patient likely can go home tomorrow on PO torsemide .  This patient's plan of care was discussed and created with Dr. Parks Bollman and he is in agreement.  Signed: Hamp Levine, PA-C  10/18/2023, 8:06 AM [facility] Cardiology

## 2023-10-19 DIAGNOSIS — I5033 Acute on chronic diastolic (congestive) heart failure: Secondary | ICD-10-CM | POA: Diagnosis not present

## 2023-10-19 LAB — BASIC METABOLIC PANEL WITH GFR
Anion gap: 12 (ref 5–15)
BUN: 65 mg/dL — ABNORMAL HIGH (ref 8–23)
CO2: 29 mmol/L (ref 22–32)
Calcium: 9 mg/dL (ref 8.9–10.3)
Chloride: 95 mmol/L — ABNORMAL LOW (ref 98–111)
Creatinine, Ser: 1.79 mg/dL — ABNORMAL HIGH (ref 0.61–1.24)
GFR, Estimated: 36 mL/min — ABNORMAL LOW (ref >60)
Glucose, Bld: 118 mg/dL — ABNORMAL HIGH (ref 70–99)
Potassium: 4 mmol/L (ref 3.5–5.1)
Sodium: 136 mmol/L (ref 135–145)

## 2023-10-19 LAB — CBC
HCT: 34.7 % — ABNORMAL LOW (ref 39.0–52.0)
Hemoglobin: 11.2 g/dL — ABNORMAL LOW (ref 13.0–17.0)
MCH: 30.4 pg (ref 26.0–34.0)
MCHC: 32.3 g/dL (ref 30.0–36.0)
MCV: 94.3 fL (ref 80.0–100.0)
Platelets: 179 10*3/uL (ref 150–400)
RBC: 3.68 MIL/uL — ABNORMAL LOW (ref 4.22–5.81)
RDW: 13.4 % (ref 11.5–15.5)
WBC: 6.9 10*3/uL (ref 4.0–10.5)
nRBC: 0 % (ref 0.0–0.2)

## 2023-10-19 NOTE — Progress Notes (Signed)
 [facility]  SUBJECTIVE: Patient laying flat in bed, reports less shortness of breath   Vitals:   10/19/23 0021 10/19/23 0500 10/19/23 0500 10/19/23 0857  BP: 121/61  (!) 145/70 127/73  Pulse: 69  89 99  Resp: 16  16 20   Temp: 97.6 F (36.4 C)  97.8 F (36.6 C) 97.8 F (36.6 C)  TempSrc: Axillary  Axillary Oral  SpO2: 91%  95% 96%  Weight:  64.9 kg    Height:         Intake/Output Summary (Last 24 hours) at 10/19/2023 5784 Last data filed at 10/19/2023 0250 Gross per 24 hour  Intake 360 ml  Output --  Net 360 ml      PHYSICAL EXAM  General: Well developed, well nourished, in no acute distress HEENT:  Normocephalic and atramatic Neck:  No JVD.  Lungs: Clear bilaterally to auscultation and percussion. Heart: HRRR . Normal S1 and S2 without gallops or murmurs.  Abdomen: Bowel sounds are positive, abdomen soft and non-tender  Msk:  Back normal, normal gait. Normal strength and tone for age. Extremities: No clubbing, cyanosis or edema.   Neuro: Alert and oriented X 3. Psych:  Good affect, responds appropriately   LABS: Basic Metabolic Panel: Recent Labs    10/18/23 0542 10/19/23 0548  NA 134* 136  K 3.9 4.0  CL 98 95*  CO2 25 29  GLUCOSE 122* 118*  BUN 60* 65*  CREATININE 1.70* 1.79*  CALCIUM  9.2 9.0   Liver Function Tests: No results for input(s): "AST", "ALT", "ALKPHOS", "BILITOT", "PROT", "ALBUMIN" in the last 72 hours. No results for input(s): "LIPASE", "AMYLASE" in the last 72 hours. CBC: Recent Labs    10/16/23 1044 10/19/23 0548  WBC 7.2 6.9  HGB 11.3* 11.2*  HCT 35.2* 34.7*  MCV 96.4 94.3  PLT 156 179   Cardiac Enzymes: No results for input(s): "CKTOTAL", "CKMB", "CKMBINDEX", "TROPONINI" in the last 72 hours. BNP: Invalid input(s): "POCBNP" D-Dimer: No results for input(s): "DDIMER" in the last 72 hours. Hemoglobin A1C: Recent Labs    10/18/23 0542  HGBA1C 6.8*   Fasting Lipid Panel: No results for input(s): "CHOL", "HDL",  "LDLCALC", "TRIG", "CHOLHDL", "LDLDIRECT" in the last 72 hours. Thyroid  Function Tests: No results for input(s): "TSH", "T4TOTAL", "T3FREE", "THYROIDAB" in the last 72 hours.  Invalid input(s): "FREET3" Anemia Panel: No results for input(s): "VITAMINB12", "FOLATE", "FERRITIN", "TIBC", "IRON ", "RETICCTPCT" in the last 72 hours.  ECHOCARDIOGRAM COMPLETE Result Date: 10/18/2023    ECHOCARDIOGRAM REPORT   Patient Name:   Ruben Green Date of Exam: 10/17/2023 Medical Rec #:  [id number]           Height:       65.0 in Accession #:    [id number]          Weight:       161.2 lb Date of Birth:  [birth year]           BSA:          1.805 m Patient Age:    89 years            BP:           169/85 mmHg Patient Gender: M                   HR:           95 bpm. Exam Location:  [facility] Procedure: 2D Echo, Cardiac Doppler and Color Doppler (Both Spectral and Color  Flow Doppler were utilized during procedure). Indications:     CHF-acute diastolic I50.31  History:         Patient has prior history of Echocardiogram examinations, most                  recent 12/24/2021.  Sonographer:     Broadus Canes Referring Phys:  [id number] [place] Diagnosing Phys: Percival Brace MD  Sonographer Comments: Technically challenging study due to limited acoustic windows, no parasternal window and no subcostal window. Image acquisition challenging due to COPD. IMPRESSIONS  1. Left ventricular ejection fraction, by estimation, is 60 to 65%. The left ventricle has normal function. The left ventricle has no regional wall motion abnormalities. Left ventricular diastolic parameters are consistent with Grade I diastolic dysfunction (impaired relaxation).  2. Right ventricular systolic function is normal. The right ventricular size is normal.  3. The mitral valve is normal in structure. Mild mitral valve regurgitation. No evidence of mitral stenosis.  4. The aortic valve is normal in structure. Aortic valve regurgitation is not  visualized. No aortic stenosis is present.  5. The inferior vena cava is normal in size with greater than 50% respiratory variability, suggesting right atrial pressure of 3 mmHg. FINDINGS  Left Ventricle: Left ventricular ejection fraction, by estimation, is 60 to 65%. The left ventricle has normal function. The left ventricle has no regional wall motion abnormalities. Strain was performed and the global longitudinal strain is indeterminate. The left ventricular internal cavity size was normal in size. There is no left ventricular hypertrophy. Left ventricular diastolic parameters are consistent with Grade I diastolic dysfunction (impaired relaxation). Right Ventricle: The right ventricular size is normal. No increase in right ventricular wall thickness. Right ventricular systolic function is normal. Left Atrium: Left atrial size was normal in size. Right Atrium: Right atrial size was normal in size. Pericardium: There is no evidence of pericardial effusion. Mitral Valve: The mitral valve is normal in structure. Mild mitral valve regurgitation. No evidence of mitral valve stenosis. Tricuspid Valve: The tricuspid valve is normal in structure. Tricuspid valve regurgitation is mild . No evidence of tricuspid stenosis. Aortic Valve: The aortic valve is normal in structure. Aortic valve regurgitation is not visualized. No aortic stenosis is present. Aortic valve mean gradient measures 2.0 mmHg. Aortic valve peak gradient measures 4.2 mmHg. Aortic valve area, by VTI measures 2.67 cm. Pulmonic Valve: The pulmonic valve was normal in structure. Pulmonic valve regurgitation is not visualized. No evidence of pulmonic stenosis. Aorta: The aortic root is normal in size and structure. Venous: The inferior vena cava is normal in size with greater than 50% respiratory variability, suggesting right atrial pressure of 3 mmHg. IAS/Shunts: No atrial level shunt detected by color flow Doppler. Additional Comments: 3D was performed not  requiring image post processing on an independent workstation and was indeterminate.  LEFT VENTRICLE PLAX 2D LVIDd:         4.80 cm   Diastology LVIDs:         3.10 cm   LV e' medial:    3.81 cm/s LV PW:         1.70 cm   LV E/e' medial:  19.9 LV IVS:        1.00 cm   LV e' lateral:   6.85 cm/s LVOT diam:     2.00 cm   LV E/e' lateral: 11.1 LV SV:         41 LV SV Index:   23 LVOT Area:  3.14 cm  RIGHT VENTRICLE RV Basal diam:  2.10 cm RV Mid diam:    1.90 cm LEFT ATRIUM             Index        RIGHT ATRIUM           Index LA diam:        4.20 cm 2.33 cm/m   RA Area:     13.90 cm LA Vol (A2C):   70.9 ml 39.28 ml/m  RA Volume:   29.70 ml  16.46 ml/m LA Vol (A4C):   81.7 ml 45.27 ml/m LA Biplane Vol: 77.9 ml 43.16 ml/m  AORTIC VALVE AV Area (Vmax):    2.26 cm AV Area (Vmean):   2.05 cm AV Area (VTI):     2.67 cm AV Vmax:           102.00 cm/s AV Vmean:          73.300 cm/s AV VTI:            0.154 m AV Peak Grad:      4.2 mmHg AV Mean Grad:      2.0 mmHg LVOT Vmax:         73.40 cm/s LVOT Vmean:        47.800 cm/s LVOT VTI:          0.131 m LVOT/AV VTI ratio: 0.85  AORTA Ao Root diam: 3.00 cm MITRAL VALVE                TRICUSPID VALVE MV Area (PHT): 4.49 cm     TR Peak grad:   14.1 mmHg MV Decel Time: 169 msec     TR Vmax:        188.00 cm/s MV E velocity: 76.00 cm/s MV A velocity: 131.00 cm/s  SHUNTS MV E/A ratio:  0.58         Systemic VTI:  0.13 m                             Systemic Diam: 2.00 cm Percival Brace MD Electronically signed by Percival Brace MD Signature Date/Time: 10/18/2023/1:34:34 PM    Final      Echo LVEF 60-65%, mild mitral regurgitation  TELEMETRY: Sinus rhythm with frequent PVCs:  ASSESSMENT AND PLAN:  Principal Problem:   CHF (congestive heart failure) (HCC) Active Problems:   COPD (chronic obstructive pulmonary disease) (HCC)   Acute respiratory failure with hypoxia (HCC)   Acute on chronic diastolic CHF (congestive heart failure) (HCC)    1.  Acute  on chronic HFpEF, BNP 287.4, LVEF 60-65%, clinically improved after initial diuresis 2.  Elevated troponin (60, 696, 105), in the absence of chest pain or new ECG changes, in the setting of acute on chronic HFpEF, likely demand supply ischemia 3.  Frequent PVCs 4.  CKD stage IIIb, BUN/creatinine 65 and 1.79, respectively  Recommendations  1.  Agree with current therapy 2.  Continue diuresis 3.  Carefully monitor renal status 4.  Continue good medical management (metoprolol  succinate, empagliflozin ) 5.  Consider starting spironolactone if renal function stabilizes or as outpatient   Percival Brace, MD, PhD, [facility] 10/19/2023 9:06 AM

## 2023-10-19 NOTE — Progress Notes (Signed)
 PROGRESS NOTE    Ruben Green  ZOX:[id number] DOB: Dec 25, 1933 DOA: 10/16/2023 PCP: Antonio Baumgarten, MD   Assessment & Plan:   Principal Problem:   CHF (congestive heart failure) (HCC) Active Problems:   Acute respiratory failure with hypoxia (HCC)   Acute on chronic diastolic CHF (congestive heart failure) (HCC)   COPD (chronic obstructive pulmonary disease) (HCC)  Assessment and Plan:  Acute on chronic hypoxic respiratory failure: likely secondary to CHF exacerbation. Continue on supplemental oxygen  and currently back at baseline of 3L    Acute on chronic diastolic CHF: continue on IV lasix , metoprolol . Monitor I/Os. Echo shows EF 60-65%, grade I diastolic dysfunction, no regional wall motion abnormalities. Needs repeat CHF education. Cardio following and recs apprec    Elevated troponins: likely secondary to demand ischemia    COPD: w/o exacerbation. Continue on bronchodilators    CKDIIIa: Cr is labile. Avoid nephrotoxic meds    Chronic hyponatremia: WNL today    Hx of CAD: was not on GDMT as per med rec.    Chronic compression fracture of T7 vertebra: continue on home dose of tramadol , robaxin  prn. Continue w/ TLSO brace       DVT prophylaxis: heparin  Code Status: full  Family Communication: discussed pt's care w/ pt's son, Dino, and answered his questions  Disposition Plan: likely d/c back home  Level of care: Telemetry Cardiac  Status is: Inpatient Remains inpatient appropriate because: requiring IV lasix       Consultants:  Cardio   Procedures:   Antimicrobials:    Subjective: Pt c/o fatigue   Objective: Vitals:   10/18/23 2036 10/19/23 0021 10/19/23 0500 10/19/23 0500  BP: 109/69 121/61  (!) 145/70  Pulse: 81 69  89  Resp: 18 16  16   Temp: 98.5 F (36.9 C) 97.6 F (36.4 C)  97.8 F (36.6 C)  TempSrc: Oral Axillary  Axillary  SpO2: 93% 91%  95%  Weight:   64.9 kg   Height:        Intake/Output Summary (Last 24 hours) at  10/19/2023 0848 Last data filed at 10/19/2023 0250 Gross per 24 hour  Intake 360 ml  Output --  Net 360 ml   Filed Weights   10/18/23 0537 10/19/23 0500  Weight: 65.2 kg 64.9 kg    Examination:  General exam: appears calm & comfortable  Respiratory system: decreased breath sounds b/l  Cardiovascular system: S1 & S2+. +1 pitting edema of b/l LE  Gastrointestinal system: Abd is soft, NT, ND & hypoactive bowel sounds  Central nervous system: alert & awake. Moves all extremities  Psychiatry: Judgement and insight appears at baseline. Flat mood and affect     Data Reviewed: I have personally reviewed following labs and imaging studies  CBC: Recent Labs  Lab 10/16/23 1044 10/19/23 0548  WBC 7.2 6.9  HGB 11.3* 11.2*  HCT 35.2* 34.7*  MCV 96.4 94.3  PLT 156 179   Basic Metabolic Panel: Recent Labs  Lab 10/16/23 1044 10/17/23 0439 10/18/23 0542 10/19/23 0548  NA 130* 133* 134* 136  K 3.7 3.9 3.9 4.0  CL 98 99 98 95*  CO2 25 26 25 29   GLUCOSE 142* 204* 122* 118*  BUN 52* 61* 60* 65*  CREATININE 1.84* 1.84* 1.70* 1.79*  CALCIUM  8.5* 8.8* 9.2 9.0   GFR: Estimated Creatinine Clearance: 23.1 mL/min (A) (by C-G formula based on SCr of 1.79 mg/dL (H)). Liver Function Tests: No results for input(s): "AST", "ALT", "ALKPHOS", "BILITOT", "PROT", "ALBUMIN" in the last  168 hours. No results for input(s): "LIPASE", "AMYLASE" in the last 168 hours. No results for input(s): "AMMONIA" in the last 168 hours. Coagulation Profile: No results for input(s): "INR", "PROTIME" in the last 168 hours. Cardiac Enzymes: No results for input(s): "CKTOTAL", "CKMB", "CKMBINDEX", "TROPONINI" in the last 168 hours. BNP (last 3 results) No results for input(s): "PROBNP" in the last 8760 hours. HbA1C: Recent Labs    10/18/23 0542  HGBA1C 6.8*   CBG: No results for input(s): "GLUCAP" in the last 168 hours. Lipid Profile: No results for input(s): "CHOL", "HDL", "LDLCALC", "TRIG", "CHOLHDL",  "LDLDIRECT" in the last 72 hours. Thyroid  Function Tests: No results for input(s): "TSH", "T4TOTAL", "FREET4", "T3FREE", "THYROIDAB" in the last 72 hours. Anemia Panel: No results for input(s): "VITAMINB12", "FOLATE", "FERRITIN", "TIBC", "IRON ", "RETICCTPCT" in the last 72 hours. Sepsis Labs: No results for input(s): "PROCALCITON", "LATICACIDVEN" in the last 168 hours.  Recent Results (from the past 240 hours)  Respiratory (~20 pathogens) panel by PCR     Status: None   Collection Time: 10/16/23  4:14 PM   Specimen: Nasopharyngeal Swab; Respiratory  Result Value Ref Range Status   Adenovirus NOT DETECTED NOT DETECTED Final   Coronavirus 229E NOT DETECTED NOT DETECTED Final    Comment: (NOTE) The Coronavirus on the Respiratory Panel, DOES NOT test for the novel  Coronavirus (2019 nCoV)    Coronavirus HKU1 NOT DETECTED NOT DETECTED Final   Coronavirus NL63 NOT DETECTED NOT DETECTED Final   Coronavirus OC43 NOT DETECTED NOT DETECTED Final   Metapneumovirus NOT DETECTED NOT DETECTED Final   Rhinovirus / Enterovirus NOT DETECTED NOT DETECTED Final   Influenza A NOT DETECTED NOT DETECTED Final   Influenza B NOT DETECTED NOT DETECTED Final   Parainfluenza Virus 1 NOT DETECTED NOT DETECTED Final   Parainfluenza Virus 2 NOT DETECTED NOT DETECTED Final   Parainfluenza Virus 3 NOT DETECTED NOT DETECTED Final   Parainfluenza Virus 4 NOT DETECTED NOT DETECTED Final   Respiratory Syncytial Virus NOT DETECTED NOT DETECTED Final   Bordetella pertussis NOT DETECTED NOT DETECTED Final   Bordetella Parapertussis NOT DETECTED NOT DETECTED Final   Chlamydophila pneumoniae NOT DETECTED NOT DETECTED Final   Mycoplasma pneumoniae NOT DETECTED NOT DETECTED Final    Comment: Performed at [facility], [address] [place], [place] [address]         Radiology Studies: ECHOCARDIOGRAM COMPLETE Result Date: 10/18/2023    ECHOCARDIOGRAM REPORT   Patient Name:   Ruben Green Date of Exam:  10/17/2023 Medical Rec #:  [id number]           Height:       65.0 in Accession #:    [id number]          Weight:       161.2 lb Date of Birth:  April 03, 1934           BSA:          1.805 m Patient Age:    89 years            BP:           169/85 mmHg Patient Gender: M                   HR:           95 bpm. Exam Location:  [facility] Procedure: 2D Echo, Cardiac Doppler and Color Doppler (Both Spectral and Color  Flow Doppler were utilized during procedure). Indications:     CHF-acute diastolic I50.31  History:         Patient has prior history of Echocardiogram examinations, most                  recent 12/24/2021.  Sonographer:     Broadus Canes Referring Phys:  [id number] [place] Diagnosing Phys: Percival Brace MD  Sonographer Comments: Technically challenging study due to limited acoustic windows, no parasternal window and no subcostal window. Image acquisition challenging due to COPD. IMPRESSIONS  1. Left ventricular ejection fraction, by estimation, is 60 to 65%. The left ventricle has normal function. The left ventricle has no regional wall motion abnormalities. Left ventricular diastolic parameters are consistent with Grade I diastolic dysfunction (impaired relaxation).  2. Right ventricular systolic function is normal. The right ventricular size is normal.  3. The mitral valve is normal in structure. Mild mitral valve regurgitation. No evidence of mitral stenosis.  4. The aortic valve is normal in structure. Aortic valve regurgitation is not visualized. No aortic stenosis is present.  5. The inferior vena cava is normal in size with greater than 50% respiratory variability, suggesting right atrial pressure of 3 mmHg. FINDINGS  Left Ventricle: Left ventricular ejection fraction, by estimation, is 60 to 65%. The left ventricle has normal function. The left ventricle has no regional wall motion abnormalities. Strain was performed and the global longitudinal strain is indeterminate. The left ventricular  internal cavity size was normal in size. There is no left ventricular hypertrophy. Left ventricular diastolic parameters are consistent with Grade I diastolic dysfunction (impaired relaxation). Right Ventricle: The right ventricular size is normal. No increase in right ventricular wall thickness. Right ventricular systolic function is normal. Left Atrium: Left atrial size was normal in size. Right Atrium: Right atrial size was normal in size. Pericardium: There is no evidence of pericardial effusion. Mitral Valve: The mitral valve is normal in structure. Mild mitral valve regurgitation. No evidence of mitral valve stenosis. Tricuspid Valve: The tricuspid valve is normal in structure. Tricuspid valve regurgitation is mild . No evidence of tricuspid stenosis. Aortic Valve: The aortic valve is normal in structure. Aortic valve regurgitation is not visualized. No aortic stenosis is present. Aortic valve mean gradient measures 2.0 mmHg. Aortic valve peak gradient measures 4.2 mmHg. Aortic valve area, by VTI measures 2.67 cm. Pulmonic Valve: The pulmonic valve was normal in structure. Pulmonic valve regurgitation is not visualized. No evidence of pulmonic stenosis. Aorta: The aortic root is normal in size and structure. Venous: The inferior vena cava is normal in size with greater than 50% respiratory variability, suggesting right atrial pressure of 3 mmHg. IAS/Shunts: No atrial level shunt detected by color flow Doppler. Additional Comments: 3D was performed not requiring image post processing on an independent workstation and was indeterminate.  LEFT VENTRICLE PLAX 2D LVIDd:         4.80 cm   Diastology LVIDs:         3.10 cm   LV e' medial:    3.81 cm/s LV PW:         1.70 cm   LV E/e' medial:  19.9 LV IVS:        1.00 cm   LV e' lateral:   6.85 cm/s LVOT diam:     2.00 cm   LV E/e' lateral: 11.1 LV SV:         41 LV SV Index:   23 LVOT Area:  3.14 cm  RIGHT VENTRICLE RV Basal diam:  2.10 cm RV Mid diam:    1.90  cm LEFT ATRIUM             Index        RIGHT ATRIUM           Index LA diam:        4.20 cm 2.33 cm/m   RA Area:     13.90 cm LA Vol (A2C):   70.9 ml 39.28 ml/m  RA Volume:   29.70 ml  16.46 ml/m LA Vol (A4C):   81.7 ml 45.27 ml/m LA Biplane Vol: 77.9 ml 43.16 ml/m  AORTIC VALVE AV Area (Vmax):    2.26 cm AV Area (Vmean):   2.05 cm AV Area (VTI):     2.67 cm AV Vmax:           102.00 cm/s AV Vmean:          73.300 cm/s AV VTI:            0.154 m AV Peak Grad:      4.2 mmHg AV Mean Grad:      2.0 mmHg LVOT Vmax:         73.40 cm/s LVOT Vmean:        47.800 cm/s LVOT VTI:          0.131 m LVOT/AV VTI ratio: 0.85  AORTA Ao Root diam: 3.00 cm MITRAL VALVE                TRICUSPID VALVE MV Area (PHT): 4.49 cm     TR Peak grad:   14.1 mmHg MV Decel Time: 169 msec     TR Vmax:        188.00 cm/s MV E velocity: 76.00 cm/s MV A velocity: 131.00 cm/s  SHUNTS MV E/A ratio:  0.58         Systemic VTI:  0.13 m                             Systemic Diam: 2.00 cm Percival Brace MD Electronically signed by Percival Brace MD Signature Date/Time: 10/18/2023/1:34:34 PM    Final         Scheduled Meds:  atorvastatin   40 mg Oral Daily   budeson-glycopyrrolate -formoterol   2 puff Inhalation BID   empagliflozin   10 mg Oral QAC breakfast   furosemide   40 mg Intravenous Daily   heparin   5,000 Units Subcutaneous Q12H   metoprolol  succinate  50 mg Oral Daily   sodium chloride  flush  3 mL Intravenous Q12H   tamsulosin   0.4 mg Oral Daily   Continuous Infusions:     LOS: 1 day     Alphonsus Jeans, MD [facility] Pager [phone]  If 7PM-7AM, please contact night-coverage [email or web address] 10/19/2023, 8:48 AM

## 2023-10-19 NOTE — Plan of Care (Signed)

## 2023-10-20 DIAGNOSIS — I5033 Acute on chronic diastolic (congestive) heart failure: Secondary | ICD-10-CM | POA: Diagnosis not present

## 2023-10-20 LAB — BASIC METABOLIC PANEL WITH GFR
Anion gap: 13 (ref 5–15)
BUN: 53 mg/dL — ABNORMAL HIGH (ref 8–23)
CO2: 25 mmol/L (ref 22–32)
Calcium: 9.1 mg/dL (ref 8.9–10.3)
Chloride: 95 mmol/L — ABNORMAL LOW (ref 98–111)
Creatinine, Ser: 1.71 mg/dL — ABNORMAL HIGH (ref 0.61–1.24)
GFR, Estimated: 38 mL/min — ABNORMAL LOW (ref >60)
Glucose, Bld: 116 mg/dL — ABNORMAL HIGH (ref 70–99)
Potassium: 4.1 mmol/L (ref 3.5–5.1)
Sodium: 133 mmol/L — ABNORMAL LOW (ref 135–145)

## 2023-10-20 MED ORDER — FUROSEMIDE 40 MG PO TABS: 40.0000 mg | ORAL_TABLET | Freq: Every day | ORAL | Status: DC

## 2023-10-20 MED ORDER — SPIRONOLACTONE 25 MG PO TABS
12.5000 mg | ORAL_TABLET | Freq: Every day | ORAL | 0 refills | Status: DC
Start: 2023-10-21 — End: 2023-11-27

## 2023-10-20 MED ORDER — SPIRONOLACTONE 12.5 MG HALF TABLET
12.5000 mg | ORAL_TABLET | Freq: Every day | ORAL | Status: DC
Start: 2023-10-20 — End: 2023-10-20
  Administered 2023-10-20: 12.5 mg via ORAL
  Filled 2023-10-20: qty 1

## 2023-10-20 MED ORDER — GUAIFENESIN-DM 100-10 MG/5ML PO SYRP: 5.0000 mL | ORAL_SOLUTION | ORAL | 0 refills | Status: AC | PRN

## 2023-10-20 NOTE — Discharge Summary (Signed)
 Physician Discharge Summary  LENN Green NWG:[id number] DOB: Nov 01, 1933 DOA: 10/16/2023  PCP: Ruben Baumgarten, MD  Admit date: 10/16/2023 Discharge date: 10/20/2023  Admitted From: home  Disposition:  home w/ home health   Recommendations for Outpatient Follow-up:  Follow up with PCP in 1-2 weeks F/u w/ cardio, Ruben Green, within 5 days  Home Health: yes Equipment/Devices:  Discharge Condition: stable  CODE STATUS: full  Diet recommendation: Heart Healthy  Brief/Interim Summary: HPI was taken from Ruben Green: Ruben Green is a 89 y.o. male with medical history significant of HTN, chronic HFpEF, chronic hypoxic respiratory failure on 3 L as needed, CKD stage IIIa, CAD, COPD, bladder cancer status post BCG treatment, kidney stones, prostate cancer, AAA s/p repair, RBBB, presented with worsening of hypoxia.   Symptoms started yesterday, patient started develop exertional dyspnea and wife found the patient oxygen  saturation level fluctuate in the mid 80s, despite provided with home oxygen .  In addition, family also noticed patient has had increasing leg swelling right more than left.  Denies any chest pain no cough no fever or chills.  Patient does complain about sometimes feel choking like sensation in the middle throat but denies any cough or choking after eat or drink.  This morning, family called EMS because patient's pulse ox level in the lower 80s.   ED Course: Afebrile, nontachycardic nonhypotensive O2 saturations 91% on room air.  Improved to 94% on 2 L.  Chest x-ray showed mild pulmonary congestion, blood work showed sodium 130 potassium 3.7 BUN 52 creatinine 1.8 compared to baseline 1.6-1.8 troponin 60 WBC 7.2 hemoglobin 11.3.   Patient was given IV Lasix  x 1 and IV Solu-Medrol  x 1 in the ED.  Discharge Diagnoses:  Principal Problem:   CHF (congestive heart failure) (HCC) Active Problems:   Acute respiratory failure with hypoxia (HCC)   Acute on chronic  diastolic CHF (congestive heart failure) (HCC)   COPD (chronic obstructive pulmonary disease) (HCC)  Acute on chronic hypoxic respiratory failure: likely secondary to CHF exacerbation. Continue on supplemental oxygen  and currently back at baseline of 3L [place]   Acute on chronic diastolic CHF: continue on lasix  while inpatient but will d/c home on po torsemide , metoprolol  & started on aldactone as per cardio. Monitor I/Os. Echo shows EF 60-65%, grade I diastolic dysfunction, no regional wall motion abnormalities. Received CHF education again. Cardio following and recs apprec    Elevated troponins: likely secondary to demand ischemia    COPD: w/o exacerbation. Continue on bronchodilators    CKDIIIa: Cr is labile. Avoid nephrotoxic meds    Chronic hyponatremia: WNL today    Hx of CAD: continue on metoprolol , aldactone, statin    Chronic compression fracture of T7 vertebra: continue on home dose of tramadol , robaxin  prn. Continue w/ TLSO brace   Discharge Instructions  Discharge Instructions     Diet - low sodium heart healthy   Complete by: As directed    Discharge instructions   Complete by: As directed    F/u w/ PCP in 1-2 weeks. F/u w/ cardio, Ruben Green, within 5 days   Increase activity slowly   Complete by: As directed       Allergies as of 10/20/2023       Reactions   Oxycodone  Other (See Comments)   Caused hallucinations/does not want anymore        Medication List     TAKE these medications    acetaminophen  500 MG tablet Commonly known as: TYLENOL  Take 1-2 tablets (  500-1,000 mg total) by mouth every 6 (six) hours as needed for mild pain.   albuterol  108 (90 Base) MCG/ACT inhaler Commonly known as: VENTOLIN  HFA Inhale 2 puffs into the lungs every 4 (four) hours as needed.   atorvastatin  40 MG tablet Commonly known as: LIPITOR Take 1 tablet (40 mg total) by mouth daily.   empagliflozin  10 MG Tabs tablet Commonly known as: Jardiance  Take 1 tablet (10 mg  total) by mouth daily before breakfast.   guaiFENesin -dextromethorphan 100-10 MG/5ML syrup Commonly known as: ROBITUSSIN DM Take 5 mLs by mouth every 4 (four) hours as needed for up to 14 days for cough.   iron  polysaccharides 150 MG capsule Commonly known as: NIFEREX Take 1 capsule (150 mg total) by mouth daily. What changed: when to take this   LORazepam  0.5 MG tablet Commonly known as: ATIVAN  Take 0.5 mg by mouth daily as needed for anxiety.   methocarbamol  500 MG tablet Commonly known as: ROBAXIN  Take 1 tablet (500 mg total) by mouth every 8 (eight) hours as needed.   metoprolol  succinate 50 MG 24 hr tablet Commonly known as: TOPROL -XL Take 1 tablet (50 mg total) by mouth daily. What changed: how much to take   spironolactone 25 MG tablet Commonly known as: ALDACTONE Take 0.5 tablets (12.5 mg total) by mouth daily. Start taking on: Oct 21, 2023   tamsulosin  0.4 MG Caps capsule Commonly known as: FLOMAX  Take 1 capsule (0.4 mg total) by mouth daily.   torsemide  10 MG tablet Commonly known as: DEMADEX  Take 1 tablet (10 mg total) by mouth daily.   traMADol  50 MG tablet Commonly known as: ULTRAM  Take 1 tablet (50 mg total) by mouth every 4 (four) hours as needed for moderate pain (pain score 4-6) or severe pain (pain score 7-10).   Trelegy Ellipta 100-62.5-25 MCG/INH Aepb Generic drug: Fluticasone -Umeclidin-Vilant Inhale 2 puffs into the lungs daily.        Follow-up Information     Ruben Green, Ruben Hollingshead, NP. Go in 1 week(s).   Specialty: Nurse Practitioner Contact information: [address] [place] [address] [phone]                Allergies  Allergen Reactions   Oxycodone  Other (See Comments)    Caused hallucinations/does not want anymore    Consultations: cardio   Procedures/Studies: ECHOCARDIOGRAM COMPLETE Result Date: 10/18/2023    ECHOCARDIOGRAM REPORT   Patient Name:   Ruben Green Date of Exam: 10/17/2023  Medical Rec #:  [id number]           Height:       65.0 in Accession #:    [id number]          Weight:       161.2 lb Date of Birth:  1934-05-01           BSA:          1.805 m Patient Age:    89 years            BP:           169/85 mmHg Patient Gender: M                   HR:           95 bpm. Exam Location:  [facility] Procedure: 2D Echo, Cardiac Doppler and Color Doppler (Both Spectral and Color            Flow Doppler were utilized during procedure).  Indications:     CHF-acute diastolic I50.31  History:         Patient has prior history of Echocardiogram examinations, most                  recent 12/24/2021.  Sonographer:     Broadus Canes Referring Phys:  [id number] [place] Diagnosing Phys: Percival Brace MD  Sonographer Comments: Technically challenging study due to limited acoustic windows, no parasternal window and no subcostal window. Image acquisition challenging due to COPD. IMPRESSIONS  1. Left ventricular ejection fraction, by estimation, is 60 to 65%. The left ventricle has normal function. The left ventricle has no regional wall motion abnormalities. Left ventricular diastolic parameters are consistent with Grade I diastolic dysfunction (impaired relaxation).  2. Right ventricular systolic function is normal. The right ventricular size is normal.  3. The mitral valve is normal in structure. Mild mitral valve regurgitation. No evidence of mitral stenosis.  4. The aortic valve is normal in structure. Aortic valve regurgitation is not visualized. No aortic stenosis is present.  5. The inferior vena cava is normal in size with greater than 50% respiratory variability, suggesting right atrial pressure of 3 mmHg. FINDINGS  Left Ventricle: Left ventricular ejection fraction, by estimation, is 60 to 65%. The left ventricle has normal function. The left ventricle has no regional wall motion abnormalities. Strain was performed and the global longitudinal strain is indeterminate. The left ventricular internal cavity  size was normal in size. There is no left ventricular hypertrophy. Left ventricular diastolic parameters are consistent with Grade I diastolic dysfunction (impaired relaxation). Right Ventricle: The right ventricular size is normal. No increase in right ventricular wall thickness. Right ventricular systolic function is normal. Left Atrium: Left atrial size was normal in size. Right Atrium: Right atrial size was normal in size. Pericardium: There is no evidence of pericardial effusion. Mitral Valve: The mitral valve is normal in structure. Mild mitral valve regurgitation. No evidence of mitral valve stenosis. Tricuspid Valve: The tricuspid valve is normal in structure. Tricuspid valve regurgitation is mild . No evidence of tricuspid stenosis. Aortic Valve: The aortic valve is normal in structure. Aortic valve regurgitation is not visualized. No aortic stenosis is present. Aortic valve mean gradient measures 2.0 mmHg. Aortic valve peak gradient measures 4.2 mmHg. Aortic valve area, by VTI measures 2.67 cm. Pulmonic Valve: The pulmonic valve was normal in structure. Pulmonic valve regurgitation is not visualized. No evidence of pulmonic stenosis. Aorta: The aortic root is normal in size and structure. Venous: The inferior vena cava is normal in size with greater than 50% respiratory variability, suggesting right atrial pressure of 3 mmHg. IAS/Shunts: No atrial level shunt detected by color flow Doppler. Additional Comments: 3D was performed not requiring image post processing on an independent workstation and was indeterminate.  LEFT VENTRICLE PLAX 2D LVIDd:         4.80 cm   Diastology LVIDs:         3.10 cm   LV e' medial:    3.81 cm/s LV PW:         1.70 cm   LV E/e' medial:  19.9 LV IVS:        1.00 cm   LV e' lateral:   6.85 cm/s LVOT diam:     2.00 cm   LV E/e' lateral: 11.1 LV SV:         41 LV SV Index:   23 LVOT Area:     3.14 cm  RIGHT VENTRICLE RV Basal diam:  2.10 cm RV Mid diam:    1.90 cm LEFT ATRIUM              Index        RIGHT ATRIUM           Index LA diam:        4.20 cm 2.33 cm/m   RA Area:     13.90 cm LA Vol (A2C):   70.9 ml 39.28 ml/m  RA Volume:   29.70 ml  16.46 ml/m LA Vol (A4C):   81.7 ml 45.27 ml/m LA Biplane Vol: 77.9 ml 43.16 ml/m  AORTIC VALVE AV Area (Vmax):    2.26 cm AV Area (Vmean):   2.05 cm AV Area (VTI):     2.67 cm AV Vmax:           102.00 cm/s AV Vmean:          73.300 cm/s AV VTI:            0.154 m AV Peak Grad:      4.2 mmHg AV Mean Grad:      2.0 mmHg LVOT Vmax:         73.40 cm/s LVOT Vmean:        47.800 cm/s LVOT VTI:          0.131 m LVOT/AV VTI ratio: 0.85  AORTA Ao Root diam: 3.00 cm MITRAL VALVE                TRICUSPID VALVE MV Area (PHT): 4.49 cm     TR Peak grad:   14.1 mmHg MV Decel Time: 169 msec     TR Vmax:        188.00 cm/s MV E velocity: 76.00 cm/s MV A velocity: 131.00 cm/s  SHUNTS MV E/A ratio:  0.58         Systemic VTI:  0.13 m                             Systemic Diam: 2.00 cm Percival Brace MD Electronically signed by Percival Brace MD Signature Date/Time: 10/18/2023/1:34:34 PM    Final    US  Venous Img Lower Bilateral (DVT) Result Date: 10/16/2023 CLINICAL DATA:  Swelling for 3-4 months EXAM: BILATERAL LOWER EXTREMITY VENOUS DOPPLER ULTRASOUND TECHNIQUE: Gray-scale sonography with compression, as well as color and duplex ultrasound, were performed to evaluate the deep venous system(s) from the level of the common femoral vein through the popliteal and proximal calf veins. COMPARISON:  Lower extremity ultrasound 03/24/2023 FINDINGS: VENOUS Normal compressibility of the common femoral, superficial femoral, and popliteal veins, as well as the visualized calf veins. Visualized portions of profunda femoral vein and great saphenous vein unremarkable. No filling defects to suggest DVT on grayscale or color Doppler imaging. Doppler waveforms show normal direction of venous flow, normal respiratory plasticity and response to augmentation. Limited  views of the contralateral common femoral vein are unremarkable. OTHER None. Limitations: none IMPRESSION: Negative. Electronically Signed   By: Rozell Cornet M.D.   On: 10/16/2023 19:54   DG Chest Portable 1 View Result Date: 10/16/2023 CLINICAL DATA:  Shortness of breath EXAM: PORTABLE CHEST 1 VIEW COMPARISON:  09/10/2023 FINDINGS: Cardiac shadow is within normal limits. Aortic calcifications are noted. Lungs are well aerated bilaterally. Minimal right basilar atelectasis is noted. No bony abnormality is seen. IMPRESSION: Minimal right basilar atelectasis. Electronically Signed   By: Violeta Grey  M.D.   On: 10/16/2023 11:04   US  RENAL Result Date: 09/30/2023 CLINICAL DATA:  Right flank pain. History of urinary retention and right hydronephrosis. EXAM: RENAL / URINARY TRACT ULTRASOUND COMPLETE COMPARISON:  CT abdomen pelvis with contrast 09/16/2023 FINDINGS: Right Kidney: Renal measurements: 10.3 cm = volume: 134 mL. No hydronephrosis. Echogenicity within normal limits. 2 simple right renal cysts are seen measuring up to 2.5 cm. The simple cysts do not require further dedicated imaging surveillance. Left Kidney: Renal measurements: 8.8 cm = volume: 9 9 mL. Echogenicity within normal limits. 1.6 cm simple cyst does not require dedicated imaging surveillance. Bladder: 2.4 cm diverticulum again noted in the left posterolateral bladder wall. Bladder otherwise normal. Other: None. IMPRESSION: 1. No hydronephrosis. 2. No significant sonographic abnormality of the kidneys. Electronically Signed   By: Elester Grim M.D.   On: 09/30/2023 09:45   (Echo, Carotid, EGD, Colonoscopy, ERCP)    Subjective: Pt c/o intermittent cough    Discharge Exam: Vitals:   10/20/23 0900 10/20/23 1236  BP: 115/79 138/61  Pulse: 100 100  Resp: 18 20  Temp: 98.9 F (37.2 C)   SpO2: 96% 96%   Vitals:   10/20/23 0010 10/20/23 0307 10/20/23 0900 10/20/23 1236  BP: 119/81 127/64 115/79 138/61  Pulse: (!) 44 92 100 100   Resp: 20 20 18 20   Temp: (!) 97.1 F (36.2 C) 98.1 F (36.7 C) 98.9 F (37.2 C)   TempSrc:   Oral   SpO2: 95% 92% 96% 96%  Weight:      Height:        General: Pt is alert, awake, not in acute distress Cardiovascular: S1/S2 +, no rubs, no gallops Respiratory: decreased breath sounds b/l  Abdominal: Soft, NT, ND, bowel sounds + Extremities: b/l  +1 pitting edema Ruben edema no cyanosis    The results of significant diagnostics from this hospitalization (including imaging, microbiology, ancillary and laboratory) are listed below for reference.     Microbiology: Recent Results (from the past 240 hours)  Respiratory (~20 pathogens) panel by PCR     Status: None   Collection Time: 10/16/23  4:14 PM   Specimen: Nasopharyngeal Swab; Respiratory  Result Value Ref Range Status   Adenovirus NOT DETECTED NOT DETECTED Final   Coronavirus 229E NOT DETECTED NOT DETECTED Final    Comment: (NOTE) The Coronavirus on the Respiratory Panel, DOES NOT test for the novel  Coronavirus (2019 nCoV)    Coronavirus HKU1 NOT DETECTED NOT DETECTED Final   Coronavirus NL63 NOT DETECTED NOT DETECTED Final   Coronavirus OC43 NOT DETECTED NOT DETECTED Final   Metapneumovirus NOT DETECTED NOT DETECTED Final   Rhinovirus / Enterovirus NOT DETECTED NOT DETECTED Final   Influenza A NOT DETECTED NOT DETECTED Final   Influenza B NOT DETECTED NOT DETECTED Final   Parainfluenza Virus 1 NOT DETECTED NOT DETECTED Final   Parainfluenza Virus 2 NOT DETECTED NOT DETECTED Final   Parainfluenza Virus 3 NOT DETECTED NOT DETECTED Final   Parainfluenza Virus 4 NOT DETECTED NOT DETECTED Final   Respiratory Syncytial Virus NOT DETECTED NOT DETECTED Final   Bordetella pertussis NOT DETECTED NOT DETECTED Final   Bordetella Parapertussis NOT DETECTED NOT DETECTED Final   Chlamydophila pneumoniae NOT DETECTED NOT DETECTED Final   Mycoplasma pneumoniae NOT DETECTED NOT DETECTED Final    Comment: Performed at [facility], [address] [place], [place] [address]     Labs: BNP (last 3 results) Recent Labs  09/16/23 1543 10/14/23 0955 10/16/23 1044  BNP 1,224.5* 216.4* 287.4*   Basic Metabolic Panel: Recent Labs  Lab 10/16/23 1044 10/17/23 0439 10/18/23 0542 10/19/23 0548 10/20/23 0856  NA 130* 133* 134* 136 133*  K 3.7 3.9 3.9 4.0 4.1  CL 98 99 98 95* 95*  CO2 25 26 25 29 25   GLUCOSE 142* 204* 122* 118* 116*  BUN 52* 61* 60* 65* 53*  CREATININE 1.84* 1.84* 1.70* 1.79* 1.71*  CALCIUM  8.5* 8.8* 9.2 9.0 9.1   Liver Function Tests: No results for input(s): "AST", "ALT", "ALKPHOS", "BILITOT", "PROT", "ALBUMIN" in the last 168 hours. No results for input(s): "LIPASE", "AMYLASE" in the last 168 hours. No results for input(s): "AMMONIA" in the last 168 hours. CBC: Recent Labs  Lab 10/16/23 1044 10/19/23 0548  WBC 7.2 6.9  HGB 11.3* 11.2*  HCT 35.2* 34.7*  MCV 96.4 94.3  PLT 156 179   Cardiac Enzymes: No results for input(s): "CKTOTAL", "CKMB", "CKMBINDEX", "TROPONINI" in the last 168 hours. BNP: Invalid input(s): "POCBNP" CBG: No results for input(s): "GLUCAP" in the last 168 hours. D-Dimer No results for input(s): "DDIMER" in the last 72 hours. Hgb A1c Recent Labs    10/18/23 0542  HGBA1C 6.8*   Lipid Profile No results for input(s): "CHOL", "HDL", "LDLCALC", "TRIG", "CHOLHDL", "LDLDIRECT" in the last 72 hours. Thyroid  function studies No results for input(s): "TSH", "T4TOTAL", "T3FREE", "THYROIDAB" in the last 72 hours.  Invalid input(s): "FREET3" Anemia work up No results for input(s): "VITAMINB12", "FOLATE", "FERRITIN", "TIBC", "IRON ", "RETICCTPCT" in the last 72 hours. Urinalysis    Component Value Date/Time   COLORURINE YELLOW (A) 09/16/2023 1543   APPEARANCEUR Clear 09/24/2023 1546   LABSPEC 1.011 09/16/2023 1543   PHURINE 5.0 09/16/2023 1543   GLUCOSEU 1+ (A) 09/24/2023 1546   HGBUR SMALL (A) 09/16/2023 1543   BILIRUBINUR Negative 09/24/2023 1546    KETONESUR NEGATIVE 09/16/2023 1543   PROTEINUR Negative 09/24/2023 1546   PROTEINUR NEGATIVE 09/16/2023 1543   NITRITE Negative 09/24/2023 1546   NITRITE NEGATIVE 09/16/2023 1543   LEUKOCYTESUR Negative 09/24/2023 1546   LEUKOCYTESUR LARGE (A) 09/16/2023 1543   Sepsis Labs Recent Labs  Lab 10/16/23 1044 10/19/23 0548  WBC 7.2 6.9   Microbiology Recent Results (from the past 240 hours)  Respiratory (~20 pathogens) panel by PCR     Status: None   Collection Time: 10/16/23  4:14 PM   Specimen: Nasopharyngeal Swab; Respiratory  Result Value Ref Range Status   Adenovirus NOT DETECTED NOT DETECTED Final   Coronavirus 229E NOT DETECTED NOT DETECTED Final    Comment: (NOTE) The Coronavirus on the Respiratory Panel, DOES NOT test for the novel  Coronavirus (2019 nCoV)    Coronavirus HKU1 NOT DETECTED NOT DETECTED Final   Coronavirus NL63 NOT DETECTED NOT DETECTED Final   Coronavirus OC43 NOT DETECTED NOT DETECTED Final   Metapneumovirus NOT DETECTED NOT DETECTED Final   Rhinovirus / Enterovirus NOT DETECTED NOT DETECTED Final   Influenza A NOT DETECTED NOT DETECTED Final   Influenza B NOT DETECTED NOT DETECTED Final   Parainfluenza Virus 1 NOT DETECTED NOT DETECTED Final   Parainfluenza Virus 2 NOT DETECTED NOT DETECTED Final   Parainfluenza Virus 3 NOT DETECTED NOT DETECTED Final   Parainfluenza Virus 4 NOT DETECTED NOT DETECTED Final   Respiratory Syncytial Virus NOT DETECTED NOT DETECTED Final   Bordetella pertussis NOT DETECTED NOT DETECTED Final   Bordetella Parapertussis NOT DETECTED NOT DETECTED Final   Chlamydophila pneumoniae NOT DETECTED NOT  DETECTED Final   Mycoplasma pneumoniae NOT DETECTED NOT DETECTED Final    Comment: Performed at [facility], [address], [place], [place] [address]     Time coordinating discharge: Over 30 minutes  SIGNED:   Alphonsus Jeans, MD  [facility] 10/20/2023, 12:54 PM Pager   If 7PM-7AM, please contact  night-coverage [email or web address]

## 2023-10-20 NOTE — Progress Notes (Signed)
 [facility] Card[facility]  SUBJECTIVE: Patient laying flat in bed, denies chest pain, reports less shortness of breath   Vitals:   10/19/23 2020 10/20/23 0010 10/20/23 0307 10/20/23 0900  BP: 114/66 119/81 127/64 115/79  Pulse: 77 (!) 44 92 100  Resp: 20 20 20 18   Temp: (!) 97.5 F (36.4 C) (!) 97.1 F (36.2 C) 98.1 F (36.7 C) 98.9 F (37.2 C)  TempSrc: Oral   Oral  SpO2: 92% 95% 92% 96%  Weight:      Height:         Intake/Output Summary (Last 24 hours) at 10/20/2023 0919 Last data filed at 10/19/2023 1924 Gross per 24 hour  Intake 720 ml  Output --  Net 720 ml      PHYSICAL EXAM  General: Well developed, well nourished, in no acute distress HEENT:  Normocephalic and atramatic Neck:  No JVD.  Lungs: Clear bilaterally to auscultation and percussion. Heart: HRRR . Normal S1 and S2 without gallops or murmurs.  Abdomen: Bowel sounds are positive, abdomen soft and non-tender  Msk:  Back normal, normal gait. Normal strength and tone for age. Extremities: No clubbing, cyanosis or edema.   Neuro: Alert and oriented X 3. Psych:  Good affect, responds appropriately   LABS: Basic Metabolic Panel: Recent Labs    10/18/23 0542 10/19/23 0548  NA 134* 136  K 3.9 4.0  CL 98 95*  CO2 25 29  GLUCOSE 122* 118*  BUN 60* 65*  CREATININE 1.70* 1.79*  CALCIUM  9.2 9.0   Liver Function Tests: No results for input(s): "AST", "ALT", "ALKPHOS", "BILITOT", "PROT", "ALBUMIN" in the last 72 hours. No results for input(s): "LIPASE", "AMYLASE" in the last 72 hours. CBC: Recent Labs    10/19/23 0548  WBC 6.9  HGB 11.2*  HCT 34.7*  MCV 94.3  PLT 179   Cardiac Enzymes: No results for input(s): "CKTOTAL", "CKMB", "CKMBINDEX", "TROPONINI" in the last 72 hours. BNP: Invalid input(s): "POCBNP" D-Dimer: No results for input(s): "DDIMER" in the last 72 hours. Hemoglobin A1C: Recent Labs    10/18/23 0542  HGBA1C 6.8*   Fasting Lipid Panel: No results for input(s): "CHOL", "HDL",  "LDLCALC", "TRIG", "CHOLHDL", "LDLDIRECT" in the last 72 hours. Thyroid  Function Tests: No results for input(s): "TSH", "T4TOTAL", "T3FREE", "THYROIDAB" in the last 72 hours.  Invalid input(s): "FREET3" Anemia Panel: No results for input(s): "VITAMINB12", "FOLATE", "FERRITIN", "TIBC", "IRON ", "RETICCTPCT" in the last 72 hours.  No results found.   Echo LVEF 60-65%  TELEMETRY: Sinus rhythm with frequent PVCs:  ASSESSMENT AND PLAN:  Principal Problem:   CHF (congestive heart failure) (HCC) Active Problems:   COPD (chronic obstructive pulmonary disease) (HCC)   Acute respiratory failure with hypoxia (HCC)   Acute on chronic diastolic CHF (congestive heart failure) (HCC)    1. Acute on chronic HFpEF, BNP 287.4, LVEF 60-65%, clinically improved after initial diuresis 2.  Elevated troponin (60, 696, 105), in the absence of chest pain or new ECG changes, in the setting of acute on chronic HFpEF, likely demand supply ischemia 3.  Frequent PVCs 4.  CKD stage IIIb, modestly improved, BUN/creatinine 53 and 1.71, respectively   Recommendations   1.  Agree with current therapy 2.  Continue diuresis 3.  Transition IV furosemide  to p.o. 40 mg daily 4.  Continue good medical management (metoprolol  succinate, empagliflozin ) 5.  Start low-dose spironolactone 12.5 mg daily 6.  Consider discharge home later today or in a.m.   Percival Brace, MD, PhD, [facility] 10/20/2023 9:19 AM

## 2023-10-20 NOTE — Plan of Care (Signed)

## 2023-10-20 NOTE — TOC Progression Note (Signed)
 Transition of Care [facility]) - Progression Note    Patient Details  Name: Ruben Green MRN: [id number] Date of Birth: 1933-11-28  Transition of Care [facility]) CM/SW Contact  Marino Sias, RN Phone Number: 10/20/2023, 11:05 AM  Clinical Narrative:  Spoke with wife about HHPT/OT recommendation, she receptive no preference, text multiple agencies referral, reply pending.         Expected Discharge Plan and Services                                               Social Determinants of Health (SDOH) Interventions SDOH Screenings   Food Insecurity: No Food Insecurity (10/16/2023)  Housing: Low Risk  (10/16/2023)  Transportation Needs: No Transportation Needs (10/16/2023)  Utilities: Not At Risk (10/16/2023)  Financial Resource Strain: Low Risk  (07/22/2023)   Received from [facility]  Physical Activity: Unknown (04/23/2017)   Received from [facility], [facility]  Social Connections: Moderately Isolated (10/16/2023)  Stress: Unknown (04/23/2017)   Received from [facility], [facility]  Tobacco Use: Medium Risk (10/16/2023)    Readmission Risk Interventions    09/17/2023   12:40 PM 12/07/2022   10:47 AM  Readmission Risk Prevention Plan  PCP or Specialist Appt within 3-5 Days  Complete  HRI or Home Care Consult  Complete  Social Work Consult for Recovery Care Planning/Counseling  Complete  Palliative Care Screening  Not Applicable  SW Recovery Care/Counseling Consult Complete   Palliative Care Screening Not Applicable   Skilled Nursing Facility Not Applicable

## 2023-10-20 NOTE — Progress Notes (Signed)
 Patient and son given flutter valve education, flutter valve provided to patient with return demonstration noted, all questions answered.

## 2023-10-21 ENCOUNTER — Other Ambulatory Visit: Payer: Self-pay

## 2023-10-21 ENCOUNTER — Other Ambulatory Visit (HOSPITAL_COMMUNITY): Payer: Self-pay

## 2023-10-21 DIAGNOSIS — J449 Chronic obstructive pulmonary disease, unspecified: Secondary | ICD-10-CM | POA: Diagnosis not present

## 2023-10-21 DIAGNOSIS — I5032 Chronic diastolic (congestive) heart failure: Secondary | ICD-10-CM | POA: Diagnosis not present

## 2023-10-23 DIAGNOSIS — J449 Chronic obstructive pulmonary disease, unspecified: Secondary | ICD-10-CM | POA: Diagnosis not present

## 2023-10-23 DIAGNOSIS — J302 Other seasonal allergic rhinitis: Secondary | ICD-10-CM | POA: Diagnosis not present

## 2023-10-23 DIAGNOSIS — H3530 Unspecified macular degeneration: Secondary | ICD-10-CM | POA: Diagnosis not present

## 2023-10-23 DIAGNOSIS — N401 Enlarged prostate with lower urinary tract symptoms: Secondary | ICD-10-CM | POA: Diagnosis not present

## 2023-10-23 DIAGNOSIS — S22068D Other fracture of T7-T8 thoracic vertebra, subsequent encounter for fracture with routine healing: Secondary | ICD-10-CM | POA: Diagnosis not present

## 2023-10-23 DIAGNOSIS — F419 Anxiety disorder, unspecified: Secondary | ICD-10-CM | POA: Diagnosis not present

## 2023-10-23 DIAGNOSIS — E538 Deficiency of other specified B group vitamins: Secondary | ICD-10-CM | POA: Diagnosis not present

## 2023-10-23 DIAGNOSIS — I130 Hypertensive heart and chronic kidney disease with heart failure and stage 1 through stage 4 chronic kidney disease, or unspecified chronic kidney disease: Secondary | ICD-10-CM | POA: Diagnosis not present

## 2023-10-23 DIAGNOSIS — N1831 Chronic kidney disease, stage 3a: Secondary | ICD-10-CM | POA: Diagnosis not present

## 2023-10-23 DIAGNOSIS — Z87442 Personal history of urinary calculi: Secondary | ICD-10-CM | POA: Diagnosis not present

## 2023-10-23 DIAGNOSIS — I6529 Occlusion and stenosis of unspecified carotid artery: Secondary | ICD-10-CM | POA: Diagnosis not present

## 2023-10-23 DIAGNOSIS — Z9981 Dependence on supplemental oxygen: Secondary | ICD-10-CM | POA: Diagnosis not present

## 2023-10-23 DIAGNOSIS — D631 Anemia in chronic kidney disease: Secondary | ICD-10-CM | POA: Diagnosis not present

## 2023-10-23 DIAGNOSIS — C679 Malignant neoplasm of bladder, unspecified: Secondary | ICD-10-CM | POA: Diagnosis not present

## 2023-10-23 DIAGNOSIS — H9190 Unspecified hearing loss, unspecified ear: Secondary | ICD-10-CM | POA: Diagnosis not present

## 2023-10-23 DIAGNOSIS — I4510 Unspecified right bundle-branch block: Secondary | ICD-10-CM | POA: Diagnosis not present

## 2023-10-23 DIAGNOSIS — Z7951 Long term (current) use of inhaled steroids: Secondary | ICD-10-CM | POA: Diagnosis not present

## 2023-10-23 DIAGNOSIS — J9621 Acute and chronic respiratory failure with hypoxia: Secondary | ICD-10-CM | POA: Diagnosis not present

## 2023-10-23 DIAGNOSIS — I2510 Atherosclerotic heart disease of native coronary artery without angina pectoris: Secondary | ICD-10-CM | POA: Diagnosis not present

## 2023-10-23 DIAGNOSIS — Z7984 Long term (current) use of oral hypoglycemic drugs: Secondary | ICD-10-CM | POA: Diagnosis not present

## 2023-10-23 DIAGNOSIS — I5033 Acute on chronic diastolic (congestive) heart failure: Secondary | ICD-10-CM | POA: Diagnosis not present

## 2023-10-23 DIAGNOSIS — M1990 Unspecified osteoarthritis, unspecified site: Secondary | ICD-10-CM | POA: Diagnosis not present

## 2023-10-23 DIAGNOSIS — E785 Hyperlipidemia, unspecified: Secondary | ICD-10-CM | POA: Diagnosis not present

## 2023-10-23 DIAGNOSIS — E871 Hypo-osmolality and hyponatremia: Secondary | ICD-10-CM | POA: Diagnosis not present

## 2023-10-24 DIAGNOSIS — I10 Essential (primary) hypertension: Secondary | ICD-10-CM | POA: Diagnosis not present

## 2023-10-24 DIAGNOSIS — J441 Chronic obstructive pulmonary disease with (acute) exacerbation: Secondary | ICD-10-CM | POA: Diagnosis not present

## 2023-10-24 DIAGNOSIS — Z09 Encounter for follow-up examination after completed treatment for conditions other than malignant neoplasm: Secondary | ICD-10-CM | POA: Diagnosis not present

## 2023-10-24 DIAGNOSIS — N1832 Chronic kidney disease, stage 3b: Secondary | ICD-10-CM | POA: Diagnosis not present

## 2023-10-24 DIAGNOSIS — I5032 Chronic diastolic (congestive) heart failure: Secondary | ICD-10-CM | POA: Diagnosis not present

## 2023-10-25 ENCOUNTER — Ambulatory Visit: Admitting: Physician Assistant

## 2023-10-28 DIAGNOSIS — J029 Acute pharyngitis, unspecified: Secondary | ICD-10-CM | POA: Diagnosis not present

## 2023-10-28 DIAGNOSIS — B9689 Other specified bacterial agents as the cause of diseases classified elsewhere: Secondary | ICD-10-CM | POA: Diagnosis not present

## 2023-10-28 DIAGNOSIS — J209 Acute bronchitis, unspecified: Secondary | ICD-10-CM | POA: Diagnosis not present

## 2023-10-28 DIAGNOSIS — J019 Acute sinusitis, unspecified: Secondary | ICD-10-CM | POA: Diagnosis not present

## 2023-10-29 DIAGNOSIS — I5032 Chronic diastolic (congestive) heart failure: Secondary | ICD-10-CM | POA: Diagnosis not present

## 2023-10-29 NOTE — Progress Notes (Unsigned)
 Referring Physician:  Antonio Baumgarten, MD [address] [facility] [place],  [place] [address]  Primary Physician:  Antonio Baumgarten, MD  History of Present Illness: 10/30/23  *Of note English somewhat limited, patient's primary language is [place].    Ruben Green is here today with a chief complaint of T7 burst fracture that he suffered approximately 1 month ago after a fall.  He has been wearing a brace, but expresses that it makes him uncomfortable and he would like to stop wearing it.  He denies any back pain, but feels as though his legs are weaker since the fall he is having more difficulty walking.  No changes to bowel and bladder function.  Denies any saddle anesthesia.  Thoracic pain, due to a thoracic fracture.  Leg weakness walking slow.   Duration: March 2025 Weakness: none Bowel/Bladder Dysfunction: none   Past Surgery: no spinal surgeries  Ruben Green has no symptoms of cervical myelopathy.  The symptoms are causing a significant impact on the patient's life.   Review of Systems:  A 10 point review of systems is negative, except for the pertinent positives and negatives detailed in the HPI.  Past Medical History: Past Medical History:  Diagnosis Date   AAA (abdominal aortic aneurysm) (HCC)    a.) s/p repair in 2005   Anemia    Anginal pain (HCC)    Anxiety    Arthritis    B12 deficiency    Bilateral cataracts    a.) s/p BILATERAL extractions in 2018   BPH with obstruction/lower urinary tract symptoms    CAD (coronary artery disease)    Carotid artery stenosis    a.) s/p CEA on the RIGHT   CHF (congestive heart failure) (HCC)    COPD (chronic obstructive pulmonary disease) (HCC)    Diastolic dysfunction 07/29/2019   a.)  TTE 07/29/2019: EF 50-55%; LA mildly enlarged; G1DD.   DOE (dyspnea on exertion)    Elevated PSA    Environmental and seasonal allergies    History of 2019 novel coronavirus disease (COVID-19)     History of kidney stones    HLD (hyperlipidemia)    HOH (hard of hearing)    HTN (hypertension)    Incomplete bladder emptying    Macular degeneration    Nausea vomiting and diarrhea 09/16/2023   Prostate cancer (HCC)    RBBB (right bundle branch block)    Valvular insufficiency    a.) TTE 07/29/2019: LVEF 50-55%; LA mild enlarged; triv AR/PR, mild MR, mod TR.    Past Surgical History: Past Surgical History:  Procedure Laterality Date   ABDOMINAL AORTIC ANEURYSM REPAIR     CATARACT EXTRACTION W/PHACO Right 09/25/2016   Procedure: CATARACT EXTRACTION PHACO AND INTRAOCULAR LENS PLACEMENT (IOC);  Surgeon: Clair Crews, MD;  Location: [facility] ORS;  Service: Ophthalmology;  Laterality: Right;  US  01:01 AP% 17.4 CDE 10.75 Fluid pack lot # [id number]   CATARACT EXTRACTION W/PHACO Left 10/16/2016   Procedure: CATARACT EXTRACTION PHACO AND INTRAOCULAR LENS PLACEMENT (IOC) Suture placed in Left eye;  Surgeon: Clair Crews, MD;  Location: [facility] ORS;  Service: Ophthalmology;  Laterality: Left;  US  2:06.8 AP% 22.2 CDE 28.17 Fluid pack lot # [id number]   COLONOSCOPY WITH PROPOFOL  N/A 03/31/2019   Procedure: COLONOSCOPY WITH PROPOFOL ;  Surgeon: Deveron Fly, MD;  Location: [facility] ENDOSCOPY;  Service: Endoscopy;  Laterality: N/A;   COLONOSCOPY WITH PROPOFOL  N/A 09/09/2020   Procedure: COLONOSCOPY WITH PROPOFOL ;  Surgeon: Shane Darling, MD;  Location: [facility] ENDOSCOPY;  Service: Endoscopy;  Laterality: N/A;   CYSTOSCOPY W/ RETROGRADES Bilateral 02/23/2020   Procedure: CYSTOSCOPY WITH RETROGRADE PYELOGRAM;  Surgeon: Geraline Knapp, MD;  Location: [facility] ORS;  Service: Urology;  Laterality: Bilateral;   CYSTOSCOPY WITH BIOPSY N/A 02/23/2020   Procedure: CYSTOSCOPY WITH BIOPSY;  Surgeon: Geraline Knapp, MD;  Location: [facility] ORS;  Service: Urology;  Laterality: N/A;   EYE SURGERY     HERNIA REPAIR     x 2   INGUINAL HERNIA REPAIR Right 12/02/2015   Procedure: HERNIA REPAIR INGUINAL ADULT;   Surgeon: Benancio Bracket, MD;  Location: [facility] ORS;  Service: General;  Laterality: Right;   PROSTATE SURGERY  2012   Right Carotid Endarterectomy     TOTAL HIP ARTHROPLASTY Right 08/01/2021   Procedure: TOTAL HIP ARTHROPLASTY;  Surgeon: Elner Hahn, MD;  Location: [facility] ORS;  Service: Orthopedics;  Laterality: Right;    Allergies: Allergies as of 10/30/2023 - Review Complete 10/30/2023  Allergen Reaction Noted   Oxycodone  Other (See Comments) 08/09/2021    Medications: Outpatient Encounter Medications as of 10/30/2023  Medication Sig   acetaminophen  (TYLENOL ) 500 MG tablet Take 1-2 tablets (500-1,000 mg total) by mouth every 6 (six) hours as needed for mild pain.   albuterol  (VENTOLIN  HFA) 108 (90 Base) MCG/ACT inhaler Inhale 2 puffs into the lungs every 4 (four) hours as needed.   atorvastatin  (LIPITOR) 40 MG tablet Take 1 tablet (40 mg total) by mouth daily.   empagliflozin  (JARDIANCE ) 10 MG TABS tablet Take 1 tablet (10 mg total) by mouth daily before breakfast.   Fluticasone -Umeclidin-Vilant (TRELEGY ELLIPTA) 100-62.5-25 MCG/INH AEPB Inhale 2 puffs into the lungs daily.   guaiFENesin -dextromethorphan (ROBITUSSIN DM) 100-10 MG/5ML syrup Take 5 mLs by mouth every 4 (four) hours as needed for up to 14 days for cough.   iron  polysaccharides (NIFEREX) 150 MG capsule Take 1 capsule (150 mg total) by mouth daily.   LORazepam  (ATIVAN ) 0.5 MG tablet Take 0.5 mg by mouth daily as needed for anxiety.   metoprolol  succinate (TOPROL -XL) 50 MG 24 hr tablet Take 1 tablet (50 mg total) by mouth daily.   spironolactone  (ALDACTONE ) 25 MG tablet Take 0.5 tablets (12.5 mg total) by mouth daily.   tamsulosin  (FLOMAX ) 0.4 MG CAPS capsule Take 1 capsule (0.4 mg total) by mouth daily.   torsemide  (DEMADEX ) 10 MG tablet Take 1 tablet (10 mg total) by mouth daily.   traMADol  (ULTRAM ) 50 MG tablet Take 1 tablet (50 mg total) by mouth every 4 (four) hours as needed for moderate pain (pain score 4-6) or severe  pain (pain score 7-10).   [DISCONTINUED] methocarbamol  (ROBAXIN ) 500 MG tablet Take 1 tablet (500 mg total) by mouth every 8 (eight) hours as needed.   No facility-administered encounter medications on file as of 10/30/2023.    Social History: Social History   Tobacco Use   Smoking status: Former    Current packs/day: 0.00    Types: Cigarettes    Quit date: 11/30/1998    Years since quitting: 24.9   Smokeless tobacco: Former   Tobacco comments:    quit 10 years ago  Vaping Use   Vaping status: Never Used  Substance Use Topics   Alcohol  use: Not Currently    Comment: occasional   Drug use: No    Family Medical History: Family History  Problem Relation Age of Onset   Cancer Brother        2000   Prostate cancer Neg  Hx    Bladder Cancer Neg Hx    Kidney cancer Neg Hx     Physical Examination: @VITALWITHPAIN @  General: Patient is well developed, well nourished, calm, collected, and in no apparent distress. Attention to examination is appropriate.  Psychiatric: Patient is non-anxious.  Head:  Pupils equal, round, and reactive to light.  ENT:  Oral mucosa appears well hydrated.  Neck:   Supple.  Full range of motion.  Respiratory: Patient is breathing without any difficulty.  Extremities: No edema.  Vascular: Palpable dorsal pedal pulses.  Skin:   On exposed skin, there are no abnormal skin lesions.  NEUROLOGICAL:     Awake, alert, oriented to person, place, and time.  Speech is clear and fluent. Fund of knowledge is appropriate.   Cranial Nerves: Pupils equal round and reactive to light.  Facial tone is symmetric.  Facial sensation is symmetric.  ROM of spine: Patient's T SLO in place.  Minimal tenderness to palpation of thoracic spine.  Strength:  Patient with some difficulty with dorsiflexion and plantarflexion although this may be due to lack of understanding for what was asked. Side Iliopsoas Quads Hamstring PF DF   R 5 5 5  AROM AROM   L 5 5 5  AROM  AROM    Reflexes are 1+ at the patella, trace achilles.   Clonus is not present.  Toes are down-going.  Bilateral upper and lower extremity sensation is intact to light touch.    Gait was not visualized.  Medical Decision Making  Imaging: EXAM: MRI THORACIC WITHOUT AND WITH CONTRAST   TECHNIQUE: Multiplanar and multiecho pulse sequences of the thoracic spine were obtained without and with intravenous contrast.   CONTRAST:  8mL GADAVIST  GADOBUTROL  1 MMOL/ML IV SOLN   COMPARISON:  CTA chest 09/13/2023   FINDINGS: Alignment:  Physiologic.   Vertebrae: T7 burst fracture with approximately 50% stenosis and 3 mm retropulsion. Moderate bone marrow edema. No other osseous abnormality.   Cord:  Normal signal and morphology.   Paraspinal and other soft tissues: Negative.   Disc levels:   No spinal canal stenosis.   IMPRESSION: Acute T7 burst fracture with approximately 50% stenosis and 3 mm retropulsion.    I have personally reviewed the images and agree with the above interpretation.  Assessment and Plan: Mr. Sokalski is a pleasant 89 y.o. male is here today with a chief complaint of T7 burst fracture that he suffered approximately 1 month ago after a fall.  He has been wearing a brace, but expresses that it makes him uncomfortable and he would like to stop wearing it.  He denies any back pain, but feels as though his legs are weaker since the fall he is having more difficulty walking.  No changes to bowel and bladder function.  Denies any saddle anesthesia.  On examination,Patient's T SLO in place.  Minimal tenderness to palpation of thoracic spine.  Full strength proximally, patient demonstrated active range of motion of bilateral lower extremities distally.  Reviewed MRI from 6 weeks ago which shows acute T7 burst fracture with 3 mm of retropulsion.  Is a pleasure to see this patient in clinic today.  X-rays obtained today does show worsening of his fracture.  Due to this  send increased weakness since the fall which with known bony retropulsion, would like updated MRI of his thoracic spine.  Advised patient that it would be recommended to wear a brace, although patient would like to take it off when he is recumbent.  We  discussed this at length and the risk involved with this.  Plan to see back in approximately 1 month.  Will review MRI results once complete.  Encouraged to reach out to me for any questions or concerns.  Red flag symptoms reviewed at length.    Thank you for involving me in the care of this patient.   I spent a total of 45 minutes in both face-to-face and non-face-to-face activities for this visit on the date of this encounter including reviewing the patient's chart, performing physical examination, independently interpreting results, ordering additional tests.  Ludwig Safer, PA-C Dept. of Neurosurgery

## 2023-10-30 ENCOUNTER — Encounter: Payer: Self-pay | Admitting: Physician Assistant

## 2023-10-30 ENCOUNTER — Other Ambulatory Visit: Payer: Self-pay

## 2023-10-30 ENCOUNTER — Ambulatory Visit
Admission: RE | Admit: 2023-10-30 | Discharge: 2023-10-30 | Disposition: A | Discharge status: Home / Self Care | Source: Ambulatory Visit | Attending: Physician Assistant | Admitting: Physician Assistant

## 2023-10-30 ENCOUNTER — Ambulatory Visit
Admission: RE | Admit: 2023-10-30 | Discharge: 2023-10-30 | Disposition: A | Discharge status: Home / Self Care | Source: Ambulatory Visit | Attending: Physician Assistant | Admitting: *Deleted

## 2023-10-30 ENCOUNTER — Ambulatory Visit: Admitting: Physician Assistant

## 2023-10-30 DIAGNOSIS — S22060S Wedge compression fracture of T7-T8 vertebra, sequela: Secondary | ICD-10-CM

## 2023-10-30 DIAGNOSIS — W19XXXA Unspecified fall, initial encounter: Secondary | ICD-10-CM | POA: Diagnosis not present

## 2023-10-30 DIAGNOSIS — S22000G Wedge compression fracture of unspecified thoracic vertebra, subsequent encounter for fracture with delayed healing: Secondary | ICD-10-CM

## 2023-10-30 DIAGNOSIS — S22061A Stable burst fracture of T7-T8 vertebra, initial encounter for closed fracture: Secondary | ICD-10-CM | POA: Diagnosis not present

## 2023-10-30 DIAGNOSIS — S22069A Unspecified fracture of T7-T8 vertebra, initial encounter for closed fracture: Secondary | ICD-10-CM | POA: Diagnosis not present

## 2023-11-05 DIAGNOSIS — N1831 Chronic kidney disease, stage 3a: Secondary | ICD-10-CM | POA: Diagnosis not present

## 2023-11-05 DIAGNOSIS — C679 Malignant neoplasm of bladder, unspecified: Secondary | ICD-10-CM | POA: Diagnosis not present

## 2023-11-05 DIAGNOSIS — J9621 Acute and chronic respiratory failure with hypoxia: Secondary | ICD-10-CM | POA: Diagnosis not present

## 2023-11-05 DIAGNOSIS — D631 Anemia in chronic kidney disease: Secondary | ICD-10-CM | POA: Diagnosis not present

## 2023-11-05 DIAGNOSIS — I130 Hypertensive heart and chronic kidney disease with heart failure and stage 1 through stage 4 chronic kidney disease, or unspecified chronic kidney disease: Secondary | ICD-10-CM | POA: Diagnosis not present

## 2023-11-05 DIAGNOSIS — J449 Chronic obstructive pulmonary disease, unspecified: Secondary | ICD-10-CM | POA: Diagnosis not present

## 2023-11-05 DIAGNOSIS — I5033 Acute on chronic diastolic (congestive) heart failure: Secondary | ICD-10-CM | POA: Diagnosis not present

## 2023-11-06 ENCOUNTER — Ambulatory Visit
Admission: RE | Admit: 2023-11-06 | Discharge: 2023-11-06 | Disposition: A | Discharge status: Home / Self Care | Source: Ambulatory Visit | Attending: Physician Assistant | Admitting: Physician Assistant

## 2023-11-06 DIAGNOSIS — M4854XA Collapsed vertebra, not elsewhere classified, thoracic region, initial encounter for fracture: Secondary | ICD-10-CM | POA: Diagnosis not present

## 2023-11-06 DIAGNOSIS — S22000G Wedge compression fracture of unspecified thoracic vertebra, subsequent encounter for fracture with delayed healing: Secondary | ICD-10-CM | POA: Diagnosis not present

## 2023-11-06 DIAGNOSIS — M40209 Unspecified kyphosis, site unspecified: Secondary | ICD-10-CM | POA: Diagnosis not present

## 2023-11-06 DIAGNOSIS — M4804 Spinal stenosis, thoracic region: Secondary | ICD-10-CM | POA: Diagnosis not present

## 2023-11-12 DIAGNOSIS — H353221 Exudative age-related macular degeneration, left eye, with active choroidal neovascularization: Secondary | ICD-10-CM | POA: Diagnosis not present

## 2023-11-13 ENCOUNTER — Other Ambulatory Visit
Admission: RE | Admit: 2023-11-13 | Discharge: 2023-11-13 | Disposition: A | Discharge status: Home / Self Care | Source: Ambulatory Visit | Attending: Nurse Practitioner | Admitting: Nurse Practitioner

## 2023-11-13 DIAGNOSIS — I2510 Atherosclerotic heart disease of native coronary artery without angina pectoris: Secondary | ICD-10-CM | POA: Diagnosis not present

## 2023-11-13 DIAGNOSIS — I714 Abdominal aortic aneurysm, without rupture, unspecified: Secondary | ICD-10-CM | POA: Diagnosis not present

## 2023-11-13 DIAGNOSIS — I6523 Occlusion and stenosis of bilateral carotid arteries: Secondary | ICD-10-CM | POA: Diagnosis not present

## 2023-11-13 DIAGNOSIS — E782 Mixed hyperlipidemia: Secondary | ICD-10-CM | POA: Diagnosis not present

## 2023-11-13 DIAGNOSIS — I5032 Chronic diastolic (congestive) heart failure: Secondary | ICD-10-CM | POA: Insufficient documentation

## 2023-11-13 DIAGNOSIS — J449 Chronic obstructive pulmonary disease, unspecified: Secondary | ICD-10-CM | POA: Diagnosis not present

## 2023-11-13 DIAGNOSIS — N1831 Chronic kidney disease, stage 3a: Secondary | ICD-10-CM | POA: Diagnosis not present

## 2023-11-13 DIAGNOSIS — I214 Non-ST elevation (NSTEMI) myocardial infarction: Secondary | ICD-10-CM | POA: Diagnosis not present

## 2023-11-13 DIAGNOSIS — I10 Essential (primary) hypertension: Secondary | ICD-10-CM | POA: Diagnosis not present

## 2023-11-13 DIAGNOSIS — E871 Hypo-osmolality and hyponatremia: Secondary | ICD-10-CM | POA: Diagnosis not present

## 2023-11-13 DIAGNOSIS — I491 Atrial premature depolarization: Secondary | ICD-10-CM | POA: Diagnosis not present

## 2023-11-13 LAB — BRAIN NATRIURETIC PEPTIDE: B Natriuretic Peptide: 134.9 pg/mL — ABNORMAL HIGH (ref 0.0–100.0)

## 2023-11-20 DIAGNOSIS — N189 Chronic kidney disease, unspecified: Secondary | ICD-10-CM | POA: Diagnosis not present

## 2023-11-20 DIAGNOSIS — I2510 Atherosclerotic heart disease of native coronary artery without angina pectoris: Secondary | ICD-10-CM | POA: Diagnosis not present

## 2023-11-20 DIAGNOSIS — I714 Abdominal aortic aneurysm, without rupture, unspecified: Secondary | ICD-10-CM | POA: Diagnosis not present

## 2023-11-20 DIAGNOSIS — I5032 Chronic diastolic (congestive) heart failure: Secondary | ICD-10-CM | POA: Diagnosis not present

## 2023-11-20 DIAGNOSIS — N179 Acute kidney failure, unspecified: Secondary | ICD-10-CM | POA: Diagnosis not present

## 2023-11-20 DIAGNOSIS — I214 Non-ST elevation (NSTEMI) myocardial infarction: Secondary | ICD-10-CM | POA: Diagnosis not present

## 2023-11-20 DIAGNOSIS — E782 Mixed hyperlipidemia: Secondary | ICD-10-CM | POA: Diagnosis not present

## 2023-11-20 DIAGNOSIS — I10 Essential (primary) hypertension: Secondary | ICD-10-CM | POA: Diagnosis not present

## 2023-11-21 DIAGNOSIS — J449 Chronic obstructive pulmonary disease, unspecified: Secondary | ICD-10-CM | POA: Diagnosis not present

## 2023-11-21 DIAGNOSIS — I5032 Chronic diastolic (congestive) heart failure: Secondary | ICD-10-CM | POA: Diagnosis not present

## 2023-11-25 ENCOUNTER — Other Ambulatory Visit: Payer: Self-pay

## 2023-11-25 DIAGNOSIS — I10 Essential (primary) hypertension: Secondary | ICD-10-CM | POA: Diagnosis not present

## 2023-11-25 DIAGNOSIS — I6523 Occlusion and stenosis of bilateral carotid arteries: Secondary | ICD-10-CM | POA: Diagnosis not present

## 2023-11-25 DIAGNOSIS — N179 Acute kidney failure, unspecified: Secondary | ICD-10-CM | POA: Diagnosis not present

## 2023-11-25 DIAGNOSIS — E782 Mixed hyperlipidemia: Secondary | ICD-10-CM | POA: Diagnosis not present

## 2023-11-25 DIAGNOSIS — S22060S Wedge compression fracture of T7-T8 vertebra, sequela: Secondary | ICD-10-CM

## 2023-11-25 DIAGNOSIS — I2510 Atherosclerotic heart disease of native coronary artery without angina pectoris: Secondary | ICD-10-CM | POA: Diagnosis not present

## 2023-11-27 ENCOUNTER — Ambulatory Visit
Admission: RE | Admit: 2023-11-27 | Discharge: 2023-11-27 | Disposition: A | Discharge status: Home / Self Care | Source: Ambulatory Visit | Attending: Physician Assistant | Admitting: Physician Assistant

## 2023-11-27 ENCOUNTER — Ambulatory Visit: Admitting: Physician Assistant

## 2023-11-27 VITALS — BP 130/62 | Ht 61.0 in | Wt 143.0 lb

## 2023-11-27 DIAGNOSIS — S22060S Wedge compression fracture of T7-T8 vertebra, sequela: Secondary | ICD-10-CM

## 2023-11-27 DIAGNOSIS — M419 Scoliosis, unspecified: Secondary | ICD-10-CM | POA: Diagnosis not present

## 2023-11-27 DIAGNOSIS — W19XXXD Unspecified fall, subsequent encounter: Secondary | ICD-10-CM | POA: Diagnosis not present

## 2023-11-27 DIAGNOSIS — S22061D Stable burst fracture of T7-T8 vertebra, subsequent encounter for fracture with routine healing: Secondary | ICD-10-CM

## 2023-11-27 NOTE — Progress Notes (Signed)
 Referring Physician:  No referring provider defined for this encounter.  Primary Physician:  Antonio Baumgarten, MD  History of Present Illness: 10/30/23  *Of note English somewhat limited, patient's primary language is [place].    Mr. Ruben Green is here today with a chief complaint of T7 burst fracture that he suffered approximately 3 months ago after a fall.  He denies any pain in his back at this time.  He does feel as though he has some weakness in his legs, but only notices it when he is walking and feeling as though he cannot walk as fast.  No numbness or tingling at this time.  No saddle anesthesia.  No dysfunction of bowel or bladder other than baseline.    Past Surgery: no spinal surgeries  Ruben Green has no symptoms of cervical myelopathy.  The symptoms are causing a significant impact on the patient's life.   Review of Systems:  A 10 point review of systems is negative, except for the pertinent positives and negatives detailed in the HPI.  Past Medical History: Past Medical History:  Diagnosis Date   AAA (abdominal aortic aneurysm) (HCC)    a.) s/p repair in 2005   Anemia    Anginal pain (HCC)    Anxiety    Arthritis    B12 deficiency    Bilateral cataracts    a.) s/p BILATERAL extractions in 2018   BPH with obstruction/lower urinary tract symptoms    CAD (coronary artery disease)    Carotid artery stenosis    a.) s/p CEA on the RIGHT   CHF (congestive heart failure) (HCC)    COPD (chronic obstructive pulmonary disease) (HCC)    Diastolic dysfunction 07/29/2019   a.)  TTE 07/29/2019: EF 50-55%; LA mildly enlarged; G1DD.   DOE (dyspnea on exertion)    Elevated PSA    Environmental and seasonal allergies    History of 2019 novel coronavirus disease (COVID-19)    History of kidney stones    HLD (hyperlipidemia)    HOH (hard of hearing)    HTN (hypertension)    Incomplete bladder emptying    Macular degeneration    Nausea vomiting and  diarrhea 09/16/2023   Prostate cancer (HCC)    RBBB (right bundle branch block)    Valvular insufficiency    a.) TTE 07/29/2019: LVEF 50-55%; LA mild enlarged; triv AR/PR, mild MR, mod TR.    Past Surgical History: Past Surgical History:  Procedure Laterality Date   ABDOMINAL AORTIC ANEURYSM REPAIR     CATARACT EXTRACTION W/PHACO Right 09/25/2016   Procedure: CATARACT EXTRACTION PHACO AND INTRAOCULAR LENS PLACEMENT (IOC);  Surgeon: Clair Crews, MD;  Location: [facility] ORS;  Service: Ophthalmology;  Laterality: Right;  US  01:01 AP% 17.4 CDE 10.75 Fluid pack lot # [id number] H   CATARACT EXTRACTION W/PHACO Left 10/16/2016   Procedure: CATARACT EXTRACTION PHACO AND INTRAOCULAR LENS PLACEMENT (IOC) Suture placed in Left eye;  Surgeon: Clair Crews, MD;  Location: [facility] ORS;  Service: Ophthalmology;  Laterality: Left;  US  2:06.8 AP% 22.2 CDE 28.17 Fluid pack lot # [id number]   COLONOSCOPY WITH PROPOFOL  N/A 03/31/2019   Procedure: COLONOSCOPY WITH PROPOFOL ;  Surgeon: Deveron Fly, MD;  Location[facility] ENDOSCOPY;  Service: Endoscopy;  Laterality: N/A;   COLONOSCOPY WITH PROPOFOL  N/A 09/09/2020   Procedure: COLONOSCOPY WITH PROPOFOL ;  Surgeon: Shane Darling, MD;  Location: [facility] ENDOSCOPY;  Service: Endoscopy;  Laterality: N/A;   CYSTOSCOPY W/ RETROGRADES Bilateral 02/23/2020   Procedure: CYSTOSCOPY WITH RETROGRADE PYELOGRAM;  Surgeon: Geraline Knapp, MD;  Location: [facility] ORS;  Service: Urology;  Laterality: Bilateral;   CYSTOSCOPY WITH BIOPSY N/A 02/23/2020   Procedure: CYSTOSCOPY WITH BIOPSY;  Surgeon: Geraline Knapp, MD;  Location: [facility] ORS;  Service: Urology;  Laterality: N/A;   EYE SURGERY     HERNIA REPAIR     x 2   INGUINAL HERNIA REPAIR Right 12/02/2015   Procedure: HERNIA REPAIR INGUINAL ADULT;  Surgeon: Benancio Bracket, MD;  Location: [facility] ORS;  Service: General;  Laterality: Right;   PROSTATE SURGERY  2012   Right Carotid Endarterectomy     TOTAL HIP ARTHROPLASTY Right  08/01/2021   Procedure: TOTAL HIP ARTHROPLASTY;  Surgeon: Elner Hahn, MD;  Location: [facility] ORS;  Service: Orthopedics;  Laterality: Right;    Allergies: Allergies as of 11/27/2023 - Review Complete 11/27/2023  Allergen Reaction Noted   Oxycodone  Other (See Comments) 08/09/2021    Medications: Outpatient Encounter Medications as of 11/27/2023  Medication Sig   acetaminophen  (TYLENOL ) 500 MG tablet Take 1-2 tablets (500-1,000 mg total) by mouth every 6 (six) hours as needed for mild pain.   albuterol  (VENTOLIN  HFA) 108 (90 Base) MCG/ACT inhaler Inhale 2 puffs into the lungs every 4 (four) hours as needed.   atorvastatin  (LIPITOR) 40 MG tablet Take 1 tablet (40 mg total) by mouth daily.   empagliflozin  (JARDIANCE ) 10 MG TABS tablet Take 1 tablet (10 mg total) by mouth daily before breakfast.   Fluticasone -Umeclidin-Vilant (TRELEGY ELLIPTA) 100-62.5-25 MCG/INH AEPB Inhale 2 puffs into the lungs daily.   iron  polysaccharides (NIFEREX) 150 MG capsule Take 1 capsule (150 mg total) by mouth daily.   LORazepam  (ATIVAN ) 0.5 MG tablet Take 0.5 mg by mouth daily as needed for anxiety.   metoprolol  succinate (TOPROL -XL) 50 MG 24 hr tablet Take 1 tablet (50 mg total) by mouth daily.   tamsulosin  (FLOMAX ) 0.4 MG CAPS capsule Take 1 capsule (0.4 mg total) by mouth daily.   torsemide  (DEMADEX ) 10 MG tablet Take 1 tablet (10 mg total) by mouth daily.   traMADol  (ULTRAM ) 50 MG tablet Take 1 tablet (50 mg total) by mouth every 4 (four) hours as needed for moderate pain (pain score 4-6) or severe pain (pain score 7-10).   [DISCONTINUED] spironolactone  (ALDACTONE ) 25 MG tablet Take 0.5 tablets (12.5 mg total) by mouth daily.   No facility-administered encounter medications on file as of 11/27/2023.    Social History: Social History   Tobacco Use   Smoking status: Former    Current packs/day: 0.00    Types: Cigarettes    Quit date: 11/30/1998    Years since quitting: 25.0   Smokeless tobacco: Former    Tobacco comments:    quit 10 years ago  Vaping Use   Vaping status: Never Used  Substance Use Topics   Alcohol  use: Not Currently    Comment: occasional   Drug use: No    Family Medical History: Family History  Problem Relation Age of Onset   Cancer Brother        2000   Prostate cancer Neg Hx    Bladder Cancer Neg Hx    Kidney cancer Neg Hx     Physical Examination: @VITALWITHPAIN @  General: Patient is well developed, well nourished, calm, collected, and in no apparent distress. Attention to examination is appropriate.  Psychiatric: Patient is non-anxious.  Head:  Pupils equal, round, and reactive to light.  ENT:  Oral mucosa appears well hydrated.  Neck:   Supple.  Full range of motion.  Respiratory: Patient is breathing without any difficulty.  Extremities: No edema.  Vascular: Palpable dorsal pedal pulses.  Skin:   On exposed skin, there are no abnormal skin lesions.  NEUROLOGICAL:     Awake, alert, oriented to person, place, and time.  Speech is clear and fluent. Fund of knowledge is appropriate.   Cranial Nerves: Pupils equal round and reactive to light.  Facial tone is symmetric.  Facial sensation is symmetric.  ROM of spine: Patient's T SLO in place.  Minimal tenderness to palpation of thoracic spine.  Strength:  Patient with some difficulty with dorsiflexion and plantarflexion although this may be due to lack of understanding for what was asked. Side Iliopsoas Quads Hamstring PF DF   R 5 5 5  AROM AROM   L 5 5 5  AROM AROM    Reflexes are 1+ at the patella, trace achilles.   Clonus is not present.  Toes are down-going.  Bilateral upper and lower extremity sensation is intact to light touch.    Gait was not visualized.  Medical Decision Making  Imaging: EXAM: MRI THORACIC SPINE WITHOUT CONTRAST   TECHNIQUE: Multiplanar, multisequence MR imaging of the thoracic spine was performed. No intravenous contrast was administered.   COMPARISON:  MRI  of the thoracic spine dated September 17, 2023.   FINDINGS: Alignment:  Mild kyphosis.   Vertebrae: Slight interval worsening of a severe compression fracture of T7. The vertebral body has lost 90% of its height anteriorly and there is persistent retropulsion of the posteroinferior corner of the vertebral body by approximately 4 mm, resulting in moderate focal stenosis. The protruding fragment abuts the ventral surface of the spinal cord, but does not deform or displace it. There has been interval development of erosion of the anterior superior endplate of T8. There is bone marrow edema within the superior endplate T8. There is no paraspinous fluid collection. The other thoracic vertebrae maintain their height and demonstrate normal signal intensity.   Cord:  Normal in morphology and signal intensity.   Paraspinal and other soft tissues: Unremarkable.   Disc levels:   The disc spaces are satisfactorily preserved and the spinal canal and neural foramina are patent other than at T7.   IMPRESSION: 1. Marginal interval worsening of a compression fracture of T7 and interval development of erosive changes involving the superior endplate of T8 with associated bone marrow edema. There is no evidence of paraspinous inflammation.      I have personally reviewed the images and agree with the above interpretation.  Assessment and Plan: Mr. Fullwood is a pleasant 89 y.o. male is here today with a chief complaint of T7 burst fracture that he suffered approximately 3 months ago after a fall.  He denies any pain in his back at this time.  He does feel as though he has some weakness in his legs, but only notices it when he is walking and feeling as though he cannot walk as fast.  Full strength proximally, patient demonstrated active range of motion of bilateral lower extremities distally.  Reviewed MRI from this week which shows acute T7 burst fracture with slight interval worsening and with 4 mm of  retropulsion  It was a pleasure to see patient in clinic today.  We discussed patient's imaging at length.  Dr. Felipe Horton was present during the visit today.  At this point there is no plan for neurosurgical intervention.  Ideally, patient can continue to reduce his fall risk and the fracture  will not have any additional worsening.  Patient and his wife would like their son to be present in a visit within the next couple of weeks to discuss this further.  Happy to coordinate this care.  Patient was counseled on the importance of reaching out to us  if he has a deficit or if he is starting to experience any changes.  Encouraged the family to reach out for questions or concerns prior to his next visit.   Thank you for involving me in the care of this patient.   I spent a total of 45 minutes in both face-to-face and non-face-to-face activities for this visit on the date of this encounter including reviewing the patient's chart, performing physical examination, independently interpreting results, ordering additional tests.  Ludwig Safer, PA-C Dept. of Neurosurgery

## 2023-12-09 ENCOUNTER — Encounter: Payer: Self-pay | Admitting: Neurosurgery

## 2023-12-09 ENCOUNTER — Ambulatory Visit: Admitting: Neurosurgery

## 2023-12-09 ENCOUNTER — Ambulatory Visit: Admitting: Physician Assistant

## 2023-12-09 VITALS — BP 120/68 | Ht 64.0 in | Wt 156.0 lb

## 2023-12-09 DIAGNOSIS — W19XXXD Unspecified fall, subsequent encounter: Secondary | ICD-10-CM

## 2023-12-09 DIAGNOSIS — S22060S Wedge compression fracture of T7-T8 vertebra, sequela: Secondary | ICD-10-CM

## 2023-12-09 DIAGNOSIS — S22061D Stable burst fracture of T7-T8 vertebra, subsequent encounter for fracture with routine healing: Secondary | ICD-10-CM | POA: Diagnosis not present

## 2023-12-09 NOTE — Progress Notes (Signed)
 Referring Physician:  Sadie Manna, MD [address] [facility] [place],  [place] [address]  Primary Physician:  Sadie Manna, MD  History of Present Illness: 12/09/2023  Patient is here today with his sons to follow-up.  States that his back pain has gotten significantly better.  He has not had any new weakness numbness or tingling.  No new saddle anesthesia.  No new bowel or bladder issues.   The symptoms are causing a significant impact on the patient's life.   Review of Systems:  A 10 point review of systems is negative, except for the pertinent positives and negatives detailed in the HPI.  Past Medical History: Past Medical History:  Diagnosis Date   AAA (abdominal aortic aneurysm) (HCC)    a.) s/p repair in 2005   Anemia    Anginal pain (HCC)    Anxiety    Arthritis    B12 deficiency    Bilateral cataracts    a.) s/p BILATERAL extractions in 2018   BPH with obstruction/lower urinary tract symptoms    CAD (coronary artery disease)    Carotid artery stenosis    a.) s/p CEA on the RIGHT   CHF (congestive heart failure) (HCC)    COPD (chronic obstructive pulmonary disease) (HCC)    Diastolic dysfunction 07/29/2019   a.)  TTE 07/29/2019: EF 50-55%; LA mildly enlarged; G1DD.   DOE (dyspnea on exertion)    Elevated PSA    Environmental and seasonal allergies    History of 2019 novel coronavirus disease (COVID-19)    History of kidney stones    HLD (hyperlipidemia)    HOH (hard of hearing)    HTN (hypertension)    Incomplete bladder emptying    Macular degeneration    Nausea vomiting and diarrhea 09/16/2023   Prostate cancer (HCC)    RBBB (right bundle branch block)    Valvular insufficiency    a.) TTE 07/29/2019: LVEF 50-55%; LA mild enlarged; triv AR/PR, mild MR, mod TR.    Past Surgical History: Past Surgical History:  Procedure Laterality Date   ABDOMINAL AORTIC ANEURYSM REPAIR     CATARACT EXTRACTION W/PHACO Right 09/25/2016    Procedure: CATARACT EXTRACTION PHACO AND INTRAOCULAR LENS PLACEMENT (IOC);  Surgeon: Elsie Carmine, MD;  Location: [facility];  Service: Ophthalmology;  Laterality: Right;  US  01:01 AP% 17.4 CDE 10.75 Fluid pack lot # [id number]   CATARACT EXTRACTION W/PHACO Left 10/16/2016   Procedure: CATARACT EXTRACTION PHACO AND INTRAOCULAR LENS PLACEMENT (IOC) Suture placed in Left eye;  Surgeon: Elsie Carmine, MD;  Location: [facility];  Service: Ophthalmology;  Laterality: Left;  US  2:06.8 AP% 22.2 CDE 28.17 Fluid pack lot # [id number]   COLONOSCOPY WITH PROPOFOL  N/A 03/31/2019   Procedure: COLONOSCOPY WITH PROPOFOL ;  Surgeon: Gaylyn Gladis PENNER, MD;  Location: [facility];  Service: Endoscopy;  Laterality: N/A;   COLONOSCOPY WITH PROPOFOL  N/A 09/09/2020   Procedure: COLONOSCOPY WITH PROPOFOL ;  Surgeon: Maryruth Ole DASEN, MD;  Location: [facility]OS[facility];  Service: Endoscopy;  Laterality: N/A;   CYSTOSCOPY W/ RETROGRADES Bilateral 02/23/2020   Procedure: CYSTOSCOPY WITH RETROGRADE PYELOGRAM;  Surgeon: Twylla Glendia BROCKS, MD;  Location: [facility];  Service: Urology;  Laterality: Bilateral;   CYSTOSCOPY WITH BIOPSY N/A 02/23/2020   Procedure: CYSTOSCOPY WITH BIOPSY;  Surgeon: Twylla Glendia BROCKS, MD;  Location: [facility];  Service: Urology;  Laterality: N/A;   EYE SURGERY     HERNIA REPAIR     x 2   INGUINAL HERNIA REPAIR Right 12/02/2015  Procedure: HERNIA REPAIR INGUINAL ADULT;  Surgeon: Larinda Unknown Sharps, MD;  Location: [facility];  Service: General;  Laterality: Right;   PROSTATE SURGERY  2012   Right Carotid Endarterectomy     TOTAL HIP ARTHROPLASTY Right 08/01/2021   Procedure: TOTAL HIP ARTHROPLASTY;  Surgeon: Edie Norleen PARAS, MD;  Location: [facility];  Service: Orthopedics;  Laterality: Right;    Allergies: Allergies as of 12/09/2023 - Review Complete 12/09/2023  Allergen Reaction Noted   Oxycodone  Other (See Comments) 08/09/2021    Medications: Outpatient Encounter Medications as of 12/09/2023   Medication Sig   acetaminophen  (TYLENOL ) 500 MG tablet Take 1-2 tablets (500-1,000 mg total) by mouth every 6 (six) hours as needed for mild pain.   albuterol  (VENTOLIN  HFA) 108 (90 Base) MCG/ACT inhaler Inhale 2 puffs into the lungs every 4 (four) hours as needed.   atorvastatin  (LIPITOR) 40 MG tablet Take 1 tablet (40 mg total) by mouth daily.   empagliflozin  (JARDIANCE ) 10 MG TABS tablet Take 1 tablet (10 mg total) by mouth daily before breakfast.   Fluticasone -Umeclidin-Vilant (TRELEGY ELLIPTA) 100-62.5-25 MCG/INH AEPB Inhale 2 puffs into the lungs daily.   iron  polysaccharides (NIFEREX) 150 MG capsule Take 1 capsule (150 mg total) by mouth daily.   LORazepam  (ATIVAN ) 0.5 MG tablet Take 0.5 mg by mouth daily as needed for anxiety.   metoprolol  succinate (TOPROL -XL) 50 MG 24 hr tablet Take 1 tablet (50 mg total) by mouth daily.   tamsulosin  (FLOMAX ) 0.4 MG CAPS capsule Take 1 capsule (0.4 mg total) by mouth daily.   torsemide  (DEMADEX ) 10 MG tablet Take 1 tablet (10 mg total) by mouth daily.   traMADol  (ULTRAM ) 50 MG tablet Take 1 tablet (50 mg total) by mouth every 4 (four) hours as needed for moderate pain (pain score 4-6) or severe pain (pain score 7-10).   No facility-administered encounter medications on file as of 12/09/2023.    Social History: Social History   Tobacco Use   Smoking status: Former    Current packs/day: 0.00    Types: Cigarettes    Quit date: 11/30/1998    Years since quitting: 25.0   Smokeless tobacco: Former   Tobacco comments:    quit 10 years ago  Vaping Use   Vaping status: Never Used  Substance Use Topics   Alcohol  use: Not Currently    Comment: occasional   Drug use: No    Family Medical History: Family History  Problem Relation Age of Onset   Cancer Brother        2000   Prostate cancer Neg Hx    Bladder Cancer Neg Hx    Kidney cancer Neg Hx     Physical Examination: NEUROLOGICAL:     Awake, alert, oriented to person, place, and time.   Speech is clear and fluent. Fund of knowledge is appropriate.   Cranial Nerves: Pupils equal round and reactive to light.  Facial tone is symmetric.  Facial sensation is symmetric.  ROM of spine: Patient's T SLO in place.  Minimal tenderness to palpation of thoracic spine.  Strength: No major deficits noted  Medical Decision Making  Imaging: No new imaging  I have personally reviewed the images and agree with the above interpretation.  Assessment and Plan: Ruben Green is a pleasant 89 y.o. male is here today with a chief complaint of T7 burst fracture that he suffered.  He is approximately 2 months after his most recent trauma/fall.  His pain has improved significantly.  He states  that at worst is getting to a 2 or 3.  He has been wearing his TLSO while he is upright.  Physical examination he continues to have full strength.  We did discuss that a vast majority of compression fractures are nonsurgical, and that we will continue to follow.  We discussed surgical fixation and its risks associated especially given his age and overall level of health.  He does have multiple areas of skin breakdown which would put him at a slightly higher risk of spinal infection.  We talked about the risks of spinal infection.  We also discussed that given the fact that his pain is minimal to nonexistent at this time that he seems to be healing well on his own.  That if we were to go ahead and do a spinal fusion that he would likely have postoperative pain for at least a few months.  With the family present we came to the decision to continue with conservative care, we will keep the TLSO brace for approximately 3 months total so he has 1 more month ago.  We like to get upright x-rays at the time of his next follow-up visit in 1 month.  Will likely disc continue the TLSO at that time.Ruben Penne MICAEL Claudene, MD Dept. of Neurosurgery

## 2023-12-10 DIAGNOSIS — N184 Chronic kidney disease, stage 4 (severe): Secondary | ICD-10-CM | POA: Diagnosis not present

## 2023-12-16 DIAGNOSIS — N1832 Chronic kidney disease, stage 3b: Secondary | ICD-10-CM | POA: Diagnosis not present

## 2023-12-16 DIAGNOSIS — I10 Essential (primary) hypertension: Secondary | ICD-10-CM | POA: Diagnosis not present

## 2023-12-18 ENCOUNTER — Other Ambulatory Visit: Payer: Self-pay | Admitting: Nephrology

## 2023-12-18 DIAGNOSIS — N1832 Chronic kidney disease, stage 3b: Secondary | ICD-10-CM

## 2023-12-19 ENCOUNTER — Encounter: Payer: PPO | Admitting: Family

## 2023-12-21 DIAGNOSIS — J449 Chronic obstructive pulmonary disease, unspecified: Secondary | ICD-10-CM | POA: Diagnosis not present

## 2023-12-21 DIAGNOSIS — I5032 Chronic diastolic (congestive) heart failure: Secondary | ICD-10-CM | POA: Diagnosis not present

## 2023-12-24 DIAGNOSIS — H353221 Exudative age-related macular degeneration, left eye, with active choroidal neovascularization: Secondary | ICD-10-CM | POA: Diagnosis not present

## 2023-12-24 DIAGNOSIS — H43813 Vitreous degeneration, bilateral: Secondary | ICD-10-CM | POA: Diagnosis not present

## 2023-12-24 DIAGNOSIS — H353212 Exudative age-related macular degeneration, right eye, with inactive choroidal neovascularization: Secondary | ICD-10-CM | POA: Diagnosis not present

## 2023-12-24 DIAGNOSIS — Z961 Presence of intraocular lens: Secondary | ICD-10-CM | POA: Diagnosis not present

## 2024-01-07 ENCOUNTER — Other Ambulatory Visit: Payer: Self-pay

## 2024-01-07 DIAGNOSIS — R0600 Dyspnea, unspecified: Secondary | ICD-10-CM | POA: Diagnosis not present

## 2024-01-07 DIAGNOSIS — R5381 Other malaise: Secondary | ICD-10-CM | POA: Diagnosis not present

## 2024-01-07 DIAGNOSIS — J449 Chronic obstructive pulmonary disease, unspecified: Secondary | ICD-10-CM | POA: Diagnosis not present

## 2024-01-07 DIAGNOSIS — S22060S Wedge compression fracture of T7-T8 vertebra, sequela: Secondary | ICD-10-CM

## 2024-01-07 DIAGNOSIS — J9611 Chronic respiratory failure with hypoxia: Secondary | ICD-10-CM | POA: Diagnosis not present

## 2024-01-07 DIAGNOSIS — R0689 Other abnormalities of breathing: Secondary | ICD-10-CM | POA: Diagnosis not present

## 2024-01-08 ENCOUNTER — Encounter: Payer: Self-pay | Admitting: Neurosurgery

## 2024-01-08 ENCOUNTER — Ambulatory Visit
Admission: RE | Admit: 2024-01-08 | Discharge: 2024-01-08 | Disposition: A | Discharge status: Home / Self Care | Source: Ambulatory Visit | Attending: Neurosurgery | Admitting: Neurosurgery

## 2024-01-08 ENCOUNTER — Ambulatory Visit
Admission: RE | Admit: 2024-01-08 | Discharge: 2024-01-08 | Disposition: A | Discharge status: Home / Self Care | Attending: Neurosurgery | Admitting: Neurosurgery

## 2024-01-08 ENCOUNTER — Ambulatory Visit: Admitting: Neurosurgery

## 2024-01-08 VITALS — BP 126/76 | Ht 64.0 in | Wt 156.0 lb

## 2024-01-08 DIAGNOSIS — W19XXXD Unspecified fall, subsequent encounter: Secondary | ICD-10-CM

## 2024-01-08 DIAGNOSIS — S22061D Stable burst fracture of T7-T8 vertebra, subsequent encounter for fracture with routine healing: Secondary | ICD-10-CM | POA: Diagnosis not present

## 2024-01-08 DIAGNOSIS — S22060S Wedge compression fracture of T7-T8 vertebra, sequela: Secondary | ICD-10-CM

## 2024-01-08 DIAGNOSIS — M4854XA Collapsed vertebra, not elsewhere classified, thoracic region, initial encounter for fracture: Secondary | ICD-10-CM | POA: Diagnosis not present

## 2024-01-08 NOTE — Progress Notes (Signed)
 Referring Physician:  Sadie Manna, MD [address] [facility] [place],  [place] [address]  Primary Physician:  Sadie Manna, MD  History of Present Illness: 01/08/24  He is here today.  States that he is doing very well.  Not having any back pain.  Overall he feels that he is improved significantly.   Review of Systems:  A 10 point review of systems is negative, except for the pertinent positives and negatives detailed in the HPI.  Past Medical History: Past Medical History:  Diagnosis Date   AAA (abdominal aortic aneurysm) (HCC)    a.) s/p repair in 2005   Anemia    Anginal pain (HCC)    Anxiety    Arthritis    B12 deficiency    Bilateral cataracts    a.) s/p BILATERAL extractions in 2018   BPH with obstruction/lower urinary tract symptoms    CAD (coronary artery disease)    Carotid artery stenosis    a.) s/p CEA on the RIGHT   CHF (congestive heart failure) (HCC)    COPD (chronic obstructive pulmonary disease) (HCC)    Diastolic dysfunction 07/29/2019   a.)  TTE 07/29/2019: EF 50-55%; LA mildly enlarged; G1DD.   DOE (dyspnea on exertion)    Elevated PSA    Environmental and seasonal allergies    History of 2019 novel coronavirus disease (COVID-19)    History of kidney stones    HLD (hyperlipidemia)    HOH (hard of hearing)    HTN (hypertension)    Incomplete bladder emptying    Macular degeneration    Nausea vomiting and diarrhea 09/16/2023   Prostate cancer (HCC)    RBBB (right bundle branch block)    Valvular insufficiency    a.) TTE 07/29/2019: LVEF 50-55%; LA mild enlarged; triv AR/PR, mild MR, mod TR.    Past Surgical History: Past Surgical History:  Procedure Laterality Date   ABDOMINAL AORTIC ANEURYSM REPAIR     CATARACT EXTRACTION W/PHACO Right 09/25/2016   Procedure: CATARACT EXTRACTION PHACO AND INTRAOCULAR LENS PLACEMENT (IOC);  Surgeon: Elsie Carmine, MD;  Location: [facility];  Service: Ophthalmology;  Laterality:  Right;  US  01:01 AP% 17.4 CDE 10.75 Fluid pack lot # [id number] H   CATARACT EXTRACTION W/PHACO Left 10/16/2016   Procedure: CATARACT EXTRACTION PHACO AND INTRAOCULAR LENS PLACEMENT (IOC) Suture placed in Left eye;  Surgeon: Elsie Carmine, MD;  Location: [facility];  Service: Ophthalmology;  Laterality: Left;  US  2:06.8 AP% 22.2 CDE 28.17 Fluid pack lot # [id number] H   COLONOSCOPY WITH PROPOFOL  N/A 03/31/2019   Procedure: COLONOSCOPY WITH PROPOFOL ;  Surgeon: Gaylyn Gladis PENNER, MD;  Location: [facility] ENDOSCOPY;  Service: Endoscopy;  Laterality: N/A;   COLONOSCOPY WITH PROPOFOL  N/A 09/09/2020   Procedure: COLONOSCOPY WITH PROPOFOL ;  Surgeon: Maryruth Ole DASEN, MD;  Location: [facility];  Service: Endoscopy;  Laterality: N/A;   CYSTOSCOPY W/ RETROGRADES Bilateral 02/23/2020   Procedure: CYSTOSCOPY WITH RETROGRADE PYELOGRAM;  Surgeon: Twylla Glendia BROCKS, MD;  Location: [facility];  Service: Urology;  Laterality: Bilateral;   CYSTOSCOPY WITH BIOPSY N/A 02/23/2020   Procedure: CYSTOSCOPY WITH BIOPSY;  Surgeon: Twylla Glendia BROCKS, MD;  Location: [facility];  Service: Urology;  Laterality: N/A;   EYE SURGERY     HERNIA REPAIR     x 2   INGUINAL HERNIA REPAIR Right 12/02/2015   Procedure: HERNIA REPAIR INGUINAL ADULT;  Surgeon: Larinda Unknown Sharps, MD;  Location: [facility];  Service: General;  Laterality: Right;   PROSTATE SURGERY  2012   Right Carotid Endarterectomy     TOTAL HIP ARTHROPLASTY Right 08/01/2021   Procedure: TOTAL HIP ARTHROPLASTY;  Surgeon: Edie Norleen PARAS, MD;  Location: [facility];  Service: Orthopedics;  Laterality: Right;    Allergies: Allergies as of 01/08/2024 - Review Complete 01/08/2024  Allergen Reaction Noted   Oxycodone  Other (See Comments) 08/09/2021    Medications: Outpatient Encounter Medications as of 01/08/2024  Medication Sig   acetaminophen  (TYLENOL ) 500 MG tablet Take 1-2 tablets (500-1,000 mg total) by mouth every 6 (six) hours as needed for mild pain.   albuterol  (VENTOLIN   HFA) 108 (90 Base) MCG/ACT inhaler Inhale 2 puffs into the lungs every 4 (four) hours as needed.   aspirin  EC 81 MG tablet Take 81 mg by mouth.   atorvastatin  (LIPITOR) 40 MG tablet Take 1 tablet (40 mg total) by mouth daily.   azithromycin  (ZITHROMAX ) 250 MG tablet Take 2 tablets (500mg ) by mouth on Day 1. Take 1 tablet (250mg ) by mouth on Days 2-5.   cyanocobalamin  (VITAMIN B12) 1000 MCG tablet Take 1,000 mcg by mouth.   empagliflozin  (JARDIANCE ) 10 MG TABS tablet Take 1 tablet (10 mg total) by mouth daily before breakfast.   ferrous sulfate 325 (65 FE) MG tablet Take 325 mg by mouth.   Fluticasone -Umeclidin-Vilant (TRELEGY ELLIPTA) 100-62.5-25 MCG/INH AEPB Inhale 2 puffs into the lungs daily.   ipratropium-albuterol  (DUONEB) 0.5-2.5 (3) MG/3ML SOLN Inhale 3 mLs into the lungs.   LORazepam  (ATIVAN ) 0.5 MG tablet Take 0.5 mg by mouth daily as needed for anxiety.   methylPREDNISolone  (MEDROL  DOSEPAK) 4 MG TBPK tablet See admin instructions. follow package directions   metoprolol  succinate (TOPROL -XL) 50 MG 24 hr tablet Take 1 tablet (50 mg total) by mouth daily.   tamsulosin  (FLOMAX ) 0.4 MG CAPS capsule Take 1 capsule (0.4 mg total) by mouth daily.   torsemide  (DEMADEX ) 10 MG tablet Take 1 tablet (10 mg total) by mouth daily.   traMADol  (ULTRAM ) 50 MG tablet Take 1 tablet (50 mg total) by mouth every 4 (four) hours as needed for moderate pain (pain score 4-6) or severe pain (pain score 7-10).   [DISCONTINUED] iron  polysaccharides (NIFEREX) 150 MG capsule Take 1 capsule (150 mg total) by mouth daily.   No facility-administered encounter medications on file as of 01/08/2024.    Social History: Social History   Tobacco Use   Smoking status: Former    Current packs/day: 0.00    Types: Cigarettes    Quit date: 11/30/1998    Years since quitting: 25.1   Smokeless tobacco: Former   Tobacco comments:    quit 10 years ago  Vaping Use   Vaping status: Never Used  Substance Use Topics   Alcohol   use: Not Currently    Comment: occasional   Drug use: No    Family Medical History: Family History  Problem Relation Age of Onset   Cancer Brother        2000   Prostate cancer Neg Hx    Bladder Cancer Neg Hx    Kidney cancer Neg Hx     Physical Examination: NEUROLOGICAL:     Awake, alert, oriented to person, place, and time.  Speech is clear and fluent. Fund of knowledge is appropriate.   Cranial Nerves: Pupils equal round and reactive to light.  Facial tone is symmetric.  Facial sensation is symmetric.  ROM of spine: Patient's T SLO in place.  Minimal tenderness to palpation of thoracic spine.  Strength: No major deficits noted  Medical Decision Making  Imaging: No new imaging  I have personally reviewed the images and agree with the above interpretation.  Assessment and Plan: Mr. Ruben Green is a pleasant 89 y.o. male is here today with a chief complaint of T7 burst fracture that he suffered.  He is approximately 2 months after his most recent trauma/fall.  His pain has completely resolved.  It was progressively getting better over the past few months.  His x-rays show stability.  Will plan on having him remove his brace to see how he tolerates.  Would like to follow him up in approximately 6 months to evaluate for any worsening.  We discussed risk and benefits of conservative management.  He like to go forward with this plan  Penne MICAEL Sharps, MD Dept. of Neurosurgery

## 2024-01-13 ENCOUNTER — Ambulatory Visit
Admission: RE | Admit: 2024-01-13 | Discharge: 2024-01-13 | Disposition: A | Discharge status: Home / Self Care | Source: Ambulatory Visit | Attending: Nephrology | Admitting: Nephrology

## 2024-01-13 DIAGNOSIS — N1832 Chronic kidney disease, stage 3b: Secondary | ICD-10-CM | POA: Diagnosis not present

## 2024-01-13 DIAGNOSIS — N323 Diverticulum of bladder: Secondary | ICD-10-CM | POA: Diagnosis not present

## 2024-01-21 DIAGNOSIS — I5032 Chronic diastolic (congestive) heart failure: Secondary | ICD-10-CM | POA: Diagnosis not present

## 2024-01-21 DIAGNOSIS — J449 Chronic obstructive pulmonary disease, unspecified: Secondary | ICD-10-CM | POA: Diagnosis not present

## 2024-02-03 DIAGNOSIS — R7989 Other specified abnormal findings of blood chemistry: Secondary | ICD-10-CM | POA: Diagnosis not present

## 2024-02-03 DIAGNOSIS — Z0000 Encounter for general adult medical examination without abnormal findings: Secondary | ICD-10-CM | POA: Diagnosis not present

## 2024-02-03 DIAGNOSIS — C61 Malignant neoplasm of prostate: Secondary | ICD-10-CM | POA: Diagnosis not present

## 2024-02-03 DIAGNOSIS — D649 Anemia, unspecified: Secondary | ICD-10-CM | POA: Diagnosis not present

## 2024-02-03 DIAGNOSIS — N1831 Chronic kidney disease, stage 3a: Secondary | ICD-10-CM | POA: Diagnosis not present

## 2024-02-03 DIAGNOSIS — D090 Carcinoma in situ of bladder: Secondary | ICD-10-CM | POA: Diagnosis not present

## 2024-02-03 DIAGNOSIS — J9611 Chronic respiratory failure with hypoxia: Secondary | ICD-10-CM | POA: Diagnosis not present

## 2024-02-03 DIAGNOSIS — R7309 Other abnormal glucose: Secondary | ICD-10-CM | POA: Diagnosis not present

## 2024-02-03 DIAGNOSIS — J449 Chronic obstructive pulmonary disease, unspecified: Secondary | ICD-10-CM | POA: Diagnosis not present

## 2024-02-04 DIAGNOSIS — H353221 Exudative age-related macular degeneration, left eye, with active choroidal neovascularization: Secondary | ICD-10-CM | POA: Diagnosis not present

## 2024-02-06 DIAGNOSIS — N1832 Chronic kidney disease, stage 3b: Secondary | ICD-10-CM | POA: Diagnosis not present

## 2024-02-06 DIAGNOSIS — R600 Localized edema: Secondary | ICD-10-CM | POA: Diagnosis not present

## 2024-02-06 DIAGNOSIS — N179 Acute kidney failure, unspecified: Secondary | ICD-10-CM | POA: Diagnosis not present

## 2024-02-06 DIAGNOSIS — I10 Essential (primary) hypertension: Secondary | ICD-10-CM | POA: Diagnosis not present

## 2024-02-10 DIAGNOSIS — I129 Hypertensive chronic kidney disease with stage 1 through stage 4 chronic kidney disease, or unspecified chronic kidney disease: Secondary | ICD-10-CM | POA: Diagnosis not present

## 2024-02-10 DIAGNOSIS — J9611 Chronic respiratory failure with hypoxia: Secondary | ICD-10-CM | POA: Diagnosis not present

## 2024-02-10 DIAGNOSIS — Z96641 Presence of right artificial hip joint: Secondary | ICD-10-CM | POA: Diagnosis not present

## 2024-02-10 DIAGNOSIS — D649 Anemia, unspecified: Secondary | ICD-10-CM | POA: Diagnosis not present

## 2024-02-10 DIAGNOSIS — R7309 Other abnormal glucose: Secondary | ICD-10-CM | POA: Diagnosis not present

## 2024-02-10 DIAGNOSIS — N1832 Chronic kidney disease, stage 3b: Secondary | ICD-10-CM | POA: Diagnosis not present

## 2024-02-10 DIAGNOSIS — E782 Mixed hyperlipidemia: Secondary | ICD-10-CM | POA: Diagnosis not present

## 2024-02-10 DIAGNOSIS — I10 Essential (primary) hypertension: Secondary | ICD-10-CM | POA: Diagnosis not present

## 2024-02-10 DIAGNOSIS — J449 Chronic obstructive pulmonary disease, unspecified: Secondary | ICD-10-CM | POA: Diagnosis not present

## 2024-02-10 DIAGNOSIS — I2510 Atherosclerotic heart disease of native coronary artery without angina pectoris: Secondary | ICD-10-CM | POA: Diagnosis not present

## 2024-02-18 DIAGNOSIS — J410 Simple chronic bronchitis: Secondary | ICD-10-CM | POA: Diagnosis not present

## 2024-02-18 DIAGNOSIS — N183 Chronic kidney disease, stage 3 unspecified: Secondary | ICD-10-CM | POA: Diagnosis not present

## 2024-02-21 DIAGNOSIS — J449 Chronic obstructive pulmonary disease, unspecified: Secondary | ICD-10-CM | POA: Diagnosis not present

## 2024-02-21 DIAGNOSIS — I5032 Chronic diastolic (congestive) heart failure: Secondary | ICD-10-CM | POA: Diagnosis not present

## 2024-03-11 ENCOUNTER — Ambulatory Visit: Admitting: Urology

## 2024-03-11 ENCOUNTER — Encounter: Payer: Self-pay | Admitting: Urology

## 2024-03-11 VITALS — BP 145/80 | HR 65 | Ht 62.0 in | Wt 150.0 lb

## 2024-03-11 DIAGNOSIS — Z8551 Personal history of malignant neoplasm of bladder: Secondary | ICD-10-CM | POA: Diagnosis not present

## 2024-03-11 LAB — URINALYSIS, COMPLETE
Bilirubin, UA: NEGATIVE
Ketones, UA: NEGATIVE
Leukocytes,UA: NEGATIVE
Nitrite, UA: NEGATIVE
Protein,UA: NEGATIVE
RBC, UA: NEGATIVE
Specific Gravity, UA: 1.01 (ref 1.005–1.030)
Urobilinogen, Ur: 0.2 mg/dL (ref 0.2–1.0)
pH, UA: 6 (ref 5.0–7.5)

## 2024-03-11 LAB — MICROSCOPIC EXAMINATION: Bacteria, UA: NONE SEEN

## 2024-03-11 NOTE — Progress Notes (Signed)
   03/11/24  CC:  Chief Complaint  Patient presents with   Cysto   Indications: Carcinoma in situ bladder biopsies 02/23/2020 Completed 6 week course of BCG induction 06/13/2020  HPI: No complaints today; denies gross hematuria.  UA today negative microscopy  See rooming tab for vitals NED. A&Ox3.   No respiratory distress   Abd soft, NT, ND Normal phallus with bilateral descended testicles  Cystoscopy Procedure Note  Patient identification was confirmed, informed consent was obtained, and patient was prepped using Betadine  solution.  Lidocaine  jelly was administered per urethral meatus.     Pre-Procedure: - Inspection reveals a normal caliber urethral meatus.  Procedure: The flexible cystoscope was introduced without difficulty - No urethral strictures/lesions are present. - Moderate lateral lobe enlargement  prostate  - Elevated bladder neck, moderate - Bilateral ureteral orifices identified - Bladder mucosa  reveals no ulcers, tumors, or lesions - No bladder stones -Moderate trabeculation with cellules/diverticula   Retroflexion shows small intravesical prostatic tissue with hypervascularity  Post-Procedure: - Patient tolerated the procedure well  Assessment/ Plan: No bladder mucosal abnormalities/recurrent tumor identified Tumor free x 5 years and will move to annual surveillance cystoscopy    Glendia [name] Barba, MD

## 2024-03-17 DIAGNOSIS — H353221 Exudative age-related macular degeneration, left eye, with active choroidal neovascularization: Secondary | ICD-10-CM | POA: Diagnosis not present

## 2024-03-22 DIAGNOSIS — I5032 Chronic diastolic (congestive) heart failure: Secondary | ICD-10-CM | POA: Diagnosis not present

## 2024-03-22 DIAGNOSIS — J449 Chronic obstructive pulmonary disease, unspecified: Secondary | ICD-10-CM | POA: Diagnosis not present

## 2024-03-23 ENCOUNTER — Inpatient Hospital Stay: Attending: Oncology

## 2024-03-23 DIAGNOSIS — D500 Iron deficiency anemia secondary to blood loss (chronic): Secondary | ICD-10-CM | POA: Insufficient documentation

## 2024-03-23 DIAGNOSIS — N189 Chronic kidney disease, unspecified: Secondary | ICD-10-CM | POA: Diagnosis not present

## 2024-03-23 LAB — CBC WITH DIFFERENTIAL (CANCER CENTER ONLY)
Abs Immature Granulocytes: 0.08 K/uL — ABNORMAL HIGH (ref 0.00–0.07)
Basophils Absolute: 0 K/uL (ref 0.0–0.1)
Basophils Relative: 1 %
Eosinophils Absolute: 0 K/uL (ref 0.0–0.5)
Eosinophils Relative: 0 %
HCT: 33.3 % — ABNORMAL LOW (ref 39.0–52.0)
Hemoglobin: 10.6 g/dL — ABNORMAL LOW (ref 13.0–17.0)
Immature Granulocytes: 1 %
Lymphocytes Relative: 14 %
Lymphs Abs: 0.9 K/uL (ref 0.7–4.0)
MCH: 30.6 pg (ref 26.0–34.0)
MCHC: 31.8 g/dL (ref 30.0–36.0)
MCV: 96.2 fL (ref 80.0–100.0)
Monocytes Absolute: 0.5 K/uL (ref 0.1–1.0)
Monocytes Relative: 9 %
Neutro Abs: 4.5 K/uL (ref 1.7–7.7)
Neutrophils Relative %: 75 %
Platelet Count: 193 K/uL (ref 150–400)
RBC: 3.46 MIL/uL — ABNORMAL LOW (ref 4.22–5.81)
RDW: 13.9 % (ref 11.5–15.5)
WBC Count: 6 K/uL (ref 4.0–10.5)
nRBC: 0 % (ref 0.0–0.2)

## 2024-03-23 LAB — RETIC PANEL
Immature Retic Fract: 6.5 % (ref 2.3–15.9)
RBC.: 3.45 MIL/uL — ABNORMAL LOW (ref 4.22–5.81)
Retic Count, Absolute: 41.7 K/uL (ref 19.0–186.0)
Retic Ct Pct: 1.2 % (ref 0.4–3.1)
Reticulocyte Hemoglobin: 31.5 pg (ref >27.9)

## 2024-03-23 LAB — IRON AND TIBC
Iron: 94 ug/dL (ref 45–182)
Saturation Ratios: 30 % (ref 17.9–39.5)
TIBC: 318 ug/dL (ref 250–450)
UIBC: 224 ug/dL

## 2024-03-23 LAB — FERRITIN: Ferritin: 46 ng/mL (ref 24–336)

## 2024-03-30 ENCOUNTER — Inpatient Hospital Stay

## 2024-03-30 ENCOUNTER — Inpatient Hospital Stay: Admitting: Oncology

## 2024-03-30 ENCOUNTER — Encounter: Payer: Self-pay | Admitting: Oncology

## 2024-03-30 VITALS — BP 155/74 | Temp 97.9°F | Resp 18 | Wt 152.9 lb

## 2024-03-30 VITALS — BP 143/60 | HR 61

## 2024-03-30 DIAGNOSIS — N189 Chronic kidney disease, unspecified: Secondary | ICD-10-CM | POA: Diagnosis not present

## 2024-03-30 DIAGNOSIS — D500 Iron deficiency anemia secondary to blood loss (chronic): Secondary | ICD-10-CM | POA: Diagnosis not present

## 2024-03-30 DIAGNOSIS — D631 Anemia in chronic kidney disease: Secondary | ICD-10-CM

## 2024-03-30 DIAGNOSIS — C61 Malignant neoplasm of prostate: Secondary | ICD-10-CM | POA: Diagnosis not present

## 2024-03-30 MED ORDER — SODIUM CHLORIDE 0.9% FLUSH
10.0000 mL | Freq: Once | INTRAVENOUS | Status: AC | PRN
  Administered 2024-03-30: 10 mL
  Filled 2024-03-30: qty 10

## 2024-03-30 MED ORDER — IRON SUCROSE 20 MG/ML IV SOLN
200.0000 mg | Freq: Once | INTRAVENOUS | Status: AC
  Administered 2024-03-30: 200 mg via INTRAVENOUS
  Filled 2024-03-30: qty 10

## 2024-03-30 NOTE — Assessment & Plan Note (Addendum)
 Previously treated with radiation.  Recommend patient to continue follow-up with urology and radiation oncology.   Carcinoma in situ bladder, treated in 2021.  Follow-up with urology.

## 2024-03-30 NOTE — Assessment & Plan Note (Addendum)
 He tolerates IV venofer  treatments.  Lab Results  Component Value Date   HGB 10.6 (L) 03/23/2024   TIBC 318 03/23/2024   IRONPCTSAT 30 03/23/2024   FERRITIN 46 03/23/2024    Hemoglobin has decreased Recommend IV Venofer  200mg  x 3

## 2024-03-30 NOTE — Assessment & Plan Note (Signed)
negative multiple myeloma panel, light chain ratio. Venofer to further increase iron store.

## 2024-03-30 NOTE — Progress Notes (Signed)
 Hematology/Oncology Consult note [phone] [phone]) [phone]    CHIEF COMPLAINTS/REASON FOR VISIT:  Iron  deficiency Anemia  ASSESSMENT & PLAN:  Iron  deficiency anemia due to chronic blood loss He tolerates IV venofer  treatments.  Lab Results  Component Value Date   HGB 10.6 (L) 03/23/2024   TIBC 318 03/23/2024   IRONPCTSAT 30 03/23/2024   FERRITIN 46 03/23/2024    Hemoglobin has decreased Recommend IV Venofer  200mg  x 3  Anemia due to chronic kidney disease negative multiple myeloma panel, light chain ratio. Venofer  to further increase iron  store.   Prostate cancer [facility]) Previously treated with radiation.  Recommend patient to continue follow-up with urology and radiation oncology.   Carcinoma in situ bladder, treated in 2021.  Follow-up with urology.  Patient declines [place] interpreter service  Orders Placed This Encounter  Procedures   CBC with Differential (Cancer Center Only)    Standing Status:   Future    Expected Date:   07/31/2024    Expiration Date:   10/29/2024   Iron  and TIBC    Standing Status:   Future    Expected Date:   07/31/2024    Expiration Date:   10/29/2024   Ferritin    Standing Status:   Future    Expected Date:   07/31/2024    Expiration Date:   10/29/2024   Retic Panel    Standing Status:   Future    Expected Date:   07/31/2024    Expiration Date:   10/29/2024    Follow-up in 4 months All questions were answered. The patient knows to call the clinic with any problems, questions or concerns.  Zelphia Cap, MD, PhD [facility] Hematology Oncology 03/30/2024     HISTORY OF PRESENTING ILLNESS:  Ruben Green is a  89 y.o.  male with PMH listed below who was referred to me for anemia Reviewed patient's recent labs that was done.  He was found to have abnormal CBC on 01/16/2022, hemoglobin 8.8, MCV 88.3, Reviewed patient's previous labs ordered by primary care physician's office, anemia is chronic onset, gradually worsening.   Patient has chronic kidney disease. He denies recent chest pain on exertion,  pre-syncopal episodes, or palpitations He had not noticed any recent bleeding such as epistaxis, hematuria  He denies over the counter NSAID ingestion. His last colonoscopy was  He denies any pica and eats a variety of diet. Chronic shortness of breath with exertion due to COPD. Patient was accompanied by wife.   Patient has history of prostate cancer that was treated with radiation in 2017 by Dr. Lenn.  Patient has had proctitis and intermittently he has blood in the stool.  INTERVAL HISTORY Ruben Green is a 89 y.o. male who has above history reviewed by me today presents for follow up visit for iron  deficiency anemia Patient speaks [place].  He had a hard hearing.  Accompanied by daughter Patient declines [place] interpreter service No new complaints.  Occasional blood in the stool.  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  MEDICAL HISTORY:  Past Medical History:  Diagnosis Date   AAA (abdominal aortic aneurysm)    a.) s/p repair in 2005   Anemia    Anginal pain    Anxiety    Arthritis    B12 deficiency    Bilateral cataracts    a.) s/p BILATERAL extractions in 2018   BPH with obstruction/lower urinary tract symptoms    CAD (coronary artery disease)  Carotid artery stenosis    a.) s/p CEA on the RIGHT   CHF (congestive heart failure) (HCC)    COPD (chronic obstructive pulmonary disease) (HCC)    Diastolic dysfunction 07/29/2019   a.)  TTE 07/29/2019: EF 50-55%; LA mildly enlarged; G1DD.   DOE (dyspnea on exertion)    Elevated PSA    Environmental and seasonal allergies    History of 2019 novel coronavirus disease (COVID-19)    History of kidney stones    HLD (hyperlipidemia)    HOH (hard of hearing)    HTN (hypertension)    Incomplete bladder emptying    Macular degeneration    Nausea vomiting and diarrhea 09/16/2023   Prostate cancer (HCC)    RBBB  (right bundle branch block)    Valvular insufficiency    a.) TTE 07/29/2019: LVEF 50-55%; LA mild enlarged; triv AR/PR, mild MR, mod TR.    SURGICAL HISTORY: Past Surgical History:  Procedure Laterality Date   ABDOMINAL AORTIC ANEURYSM REPAIR     CATARACT EXTRACTION W/PHACO Right 09/25/2016   Procedure: CATARACT EXTRACTION PHACO AND INTRAOCULAR LENS PLACEMENT (IOC);  Surgeon: Elsie Carmine, MD;  Location: [facility] ORS;  Service: Ophthalmology;  Laterality: Right;  US  01:01 AP% 17.4 CDE 10.75 Fluid pack lot # [id number]   CATARACT EXTRACTION W/PHACO Left 10/16/2016   Procedure: CATARACT EXTRACTION PHACO AND INTRAOCULAR LENS PLACEMENT (IOC) Suture placed in Left eye;  Surgeon: Elsie Carmine, MD;  Location: [facility] ORS;  Service: Ophthalmology;  Laterality: Left;  US  2:06.8 AP% 22.2 CDE 28.17 Fluid pack lot # [id number]   COLONOSCOPY WITH PROPOFOL  N/A 03/31/2019   Procedure: COLONOSCOPY WITH PROPOFOL ;  Surgeon: Gaylyn Gladis PENNER, MD;  Location: [facility] ENDOSCOPY;  Service: Endoscopy;  Laterality: N/A;   COLONOSCOPY WITH PROPOFOL  N/A 09/09/2020   Procedure: COLONOSCOPY WITH PROPOFOL ;  Surgeon: Maryruth Ole DASEN, MD;  Location: [facility] ENDOSCOPY;  Service: Endoscopy;  Laterality: N/A;   CYSTOSCOPY W/ RETROGRADES Bilateral 02/23/2020   Procedure: CYSTOSCOPY WITH RETROGRADE PYELOGRAM;  Surgeon: Twylla Glendia BROCKS, MD;  Location: [facility] ORS;  Service: Urology;  Laterality: Bilateral;   CYSTOSCOPY WITH BIOPSY N/A 02/23/2020   Procedure: CYSTOSCOPY WITH BIOPSY;  Surgeon: Twylla Glendia BROCKS, MD;  Location: [facility] ORS;  Service: Urology;  Laterality: N/A;   EYE SURGERY     HERNIA REPAIR     x 2   INGUINAL HERNIA REPAIR Right 12/02/2015   Procedure: HERNIA REPAIR INGUINAL ADULT;  Surgeon: Larinda Unknown Sharps, MD;  Location: [facility] ORS;  Service: General;  Laterality: Right;   PROSTATE SURGERY  2012   Right Carotid Endarterectomy     TOTAL HIP ARTHROPLASTY Right 08/01/2021   Procedure: TOTAL HIP ARTHROPLASTY;  Surgeon:  Edie Norleen PARAS, MD;  Location: [facility] ORS;  Service: Orthopedics;  Laterality: Right;    SOCIAL HISTORY: Social History   Socioeconomic History   Marital status: Married    Spouse name: Not on file   Number of children: Not on file   Years of education: Not on file   Highest education level: Not on file  Occupational History   Not on file  Tobacco Use   Smoking status: Former    Current packs/day: 0.00    Types: Cigarettes    Quit date: 11/30/1998    Years since quitting: 25.3   Smokeless tobacco: Former   Tobacco comments:    quit 10 years ago  Vaping Use   Vaping status: Never Used  Substance and Sexual Activity   Alcohol  use: Not Currently  Comment: occasional   Drug use: No   Sexual activity: Not on file  Other Topics Concern   Not on file  Social History Narrative   Not on file   Social Drivers of Health   Financial Resource Strain: Low Risk  (07/22/2023)   Received from [facility]   Overall Financial Resource Strain (CARDIA)    Difficulty of Paying Living Expenses: Not hard at all  Food Insecurity: No Food Insecurity (10/16/2023)   Hunger Vital Sign    Worried About Running Out of Food in the Last Year: Never true    Ran Out of Food in the Last Year: Never true  Transportation Needs: No Transportation Needs (10/16/2023)   PRAPARE - Administrator, Civil Service (Medical): No    Lack of Transportation (Non-Medical): No  Physical Activity: Unknown (04/23/2017)   Received from [facility]   Exercise Vital Sign    Days of Exercise per Week: Patient declined    Minutes of Exercise per Session: Patient declined  Stress: Unknown (04/23/2017)   Received from [facility] - Occupational Stress Questionnaire    Feeling of Stress : Patient declined  Social Connections: Moderately Isolated (10/16/2023)   Social Connection and Isolation Panel    Frequency of  Communication with Friends and Family: More than three times a week    Frequency of Social Gatherings with Friends and Family: More than three times a week    Attends Religious Services: Never    Database administrator or Organizations: No    Attends Banker Meetings: Never    Marital Status: Married  Catering manager Violence: Not At Risk (10/16/2023)   Humiliation, Afraid, Rape, and Kick questionnaire    Fear of Current or Ex-Partner: No    Emotionally Abused: No    Physically Abused: No    Sexually Abused: No    FAMILY HISTORY: Family History  Problem Relation Age of Onset   Cancer Brother        2000   Prostate cancer Neg Hx    Bladder Cancer Neg Hx    Kidney cancer Neg Hx     ALLERGIES:  is allergic to oxycodone .  MEDICATIONS:  Current Outpatient Medications  Medication Sig Dispense Refill   acetaminophen  (TYLENOL ) 500 MG tablet Take 1-2 tablets (500-1,000 mg total) by mouth every 6 (six) hours as needed for mild pain. 60 tablet 0   albuterol  (VENTOLIN  HFA) 108 (90 Base) MCG/ACT inhaler Inhale 2 puffs into the lungs every 4 (four) hours as needed. 8 g 0   aspirin  EC 81 MG tablet Take 81 mg by mouth.     atorvastatin  (LIPITOR) 40 MG tablet Take 1 tablet (40 mg total) by mouth daily. 30 tablet 3   cyanocobalamin  (VITAMIN B12) 1000 MCG tablet Take 1,000 mcg by mouth.     empagliflozin  (JARDIANCE ) 10 MG TABS tablet Take 1 tablet (10 mg total) by mouth daily before breakfast. 30 tablet 11   ferrous sulfate 325 (65 FE) MG tablet Take 325 mg by mouth.     Fluticasone -Umeclidin-Vilant (TRELEGY ELLIPTA) 100-62.5-25 MCG/INH AEPB Inhale 2 puffs into the lungs daily.     ipratropium-albuterol  (DUONEB) 0.5-2.5 (3) MG/3ML SOLN Inhale 3 mLs into the lungs.     LORazepam  (ATIVAN ) 0.5 MG tablet Take 0.5 mg by mouth daily as needed for anxiety.     metoprolol  succinate (TOPROL -XL) 50 MG 24 hr tablet  Take 1 tablet (50 mg total) by mouth daily. 1 tablet 3   tamsulosin  (FLOMAX )  0.4 MG CAPS capsule Take 1 capsule (0.4 mg total) by mouth daily. 30 capsule 3   torsemide  (DEMADEX ) 10 MG tablet Take 1 tablet (10 mg total) by mouth daily. 30 tablet 11   traMADol  (ULTRAM ) 50 MG tablet Take 1 tablet (50 mg total) by mouth every 4 (four) hours as needed for moderate pain (pain score 4-6) or severe pain (pain score 7-10). 30 tablet 0   No current facility-administered medications for this visit.    Review of Systems  Constitutional:  Positive for fatigue. Negative for chills and fever.  HENT:   Positive for hearing loss. Negative for voice change.   Eyes:  Negative for eye problems and icterus.  Respiratory:  Positive for shortness of breath. Negative for chest tightness and cough.   Cardiovascular:  Negative for chest pain and leg swelling.  Gastrointestinal:  Negative for abdominal distention and abdominal pain.  Endocrine: Negative for hot flashes.  Genitourinary:  Negative for difficulty urinating, dysuria and frequency.   Musculoskeletal:  Negative for arthralgias.  Skin:  Negative for itching and rash.  Neurological:  Negative for light-headedness and numbness.  Hematological:  Negative for adenopathy. Does not bruise/bleed easily.  Psychiatric/Behavioral:  Negative for confusion.     PHYSICAL EXAMINATION: ECOG PERFORMANCE STATUS: 1 - Symptomatic but completely ambulatory Vitals:   03/30/24 1448 03/30/24 1456  BP: (!) 155/66 (!) 155/74  Resp: 18   Temp: 97.9 F (36.6 C)    Filed Weights   03/30/24 1448  Weight: 152 lb 14.4 oz (69.4 kg)    Physical Exam Constitutional:      General: He is not in acute distress. HENT:     Head: Normocephalic and atraumatic.  Eyes:     General: No scleral icterus. Cardiovascular:     Rate and Rhythm: Normal rate and regular rhythm.     Heart sounds: Normal heart sounds.  Pulmonary:     Effort: Pulmonary effort is normal. No respiratory distress.     Breath sounds: No wheezing.     Comments: Significantly decreased  breath sound bilaterally Abdominal:     General: Bowel sounds are normal. There is no distension.     Palpations: Abdomen is soft.  Musculoskeletal:        General: No deformity. Normal range of motion.     Cervical back: Normal range of motion and neck supple.     Right lower leg: Edema present.     Left lower leg: No edema.  Skin:    General: Skin is warm and dry.     Findings: No erythema or rash.  Neurological:     Mental Status: He is alert and oriented to person, place, and time. Mental status is at baseline.     Cranial Nerves: No cranial nerve deficit.     Coordination: Coordination normal.  Psychiatric:        Mood and Affect: Mood normal.      LABORATORY DATA:  I have reviewed the data as listed    Latest Ref Rng & Units 03/23/2024    2:48 PM 10/19/2023    5:48 AM 10/16/2023   10:44 AM  CBC  WBC 4.0 - 10.5 K/uL 6.0  6.9  7.2   Hemoglobin 13.0 - 17.0 g/dL 89.3  88.7  88.6   Hematocrit 39.0 - 52.0 % 33.3  34.7  35.2   Platelets 150 - 400 K/uL  193  179  156       Latest Ref Rng & Units 10/20/2023    8:56 AM 10/19/2023    5:48 AM 10/18/2023    5:42 AM  CMP  Glucose 70 - 99 mg/dL 883  881  877   BUN 8 - 23 mg/dL 53  65  60   Creatinine 0.61 - 1.24 mg/dL 8.28  8.20  8.29   Sodium 135 - 145 mmol/L 133  136  134   Potassium 3.5 - 5.1 mmol/L 4.1  4.0  3.9   Chloride 98 - 111 mmol/L 95  95  98   CO2 22 - 32 mmol/L 25  29  25    Calcium  8.9 - 10.3 mg/dL 9.1  9.0  9.2       Component Value Date/Time   IRON  94 03/23/2024 1448   TIBC 318 03/23/2024 1448   FERRITIN 46 03/23/2024 1448   IRONPCTSAT 30 03/23/2024 1448     RADIOGRAPHIC STUDIES: I have personally reviewed the radiological images as listed and agreed with the findings in the report. No results found.

## 2024-04-08 ENCOUNTER — Inpatient Hospital Stay

## 2024-04-08 VITALS — BP 155/65 | HR 65 | Temp 97.7°F | Resp 18

## 2024-04-08 DIAGNOSIS — D500 Iron deficiency anemia secondary to blood loss (chronic): Secondary | ICD-10-CM

## 2024-04-08 MED ORDER — SODIUM CHLORIDE 0.9% FLUSH
10.0000 mL | Freq: Once | INTRAVENOUS | Status: AC | PRN
  Administered 2024-04-08: 10 mL
  Filled 2024-04-08: qty 10

## 2024-04-08 MED ORDER — IRON SUCROSE 20 MG/ML IV SOLN
200.0000 mg | Freq: Once | INTRAVENOUS | Status: AC
  Administered 2024-04-08: 200 mg via INTRAVENOUS
  Filled 2024-04-08: qty 10

## 2024-04-14 ENCOUNTER — Inpatient Hospital Stay

## 2024-04-14 VITALS — BP 135/55 | HR 57 | Temp 97.7°F | Resp 18

## 2024-04-14 DIAGNOSIS — D500 Iron deficiency anemia secondary to blood loss (chronic): Secondary | ICD-10-CM | POA: Diagnosis not present

## 2024-04-14 MED ORDER — IRON SUCROSE 20 MG/ML IV SOLN
200.0000 mg | Freq: Once | INTRAVENOUS | Status: AC
  Administered 2024-04-14: 200 mg via INTRAVENOUS
  Filled 2024-04-14: qty 10

## 2024-04-14 NOTE — Patient Instructions (Signed)

## 2024-04-16 DIAGNOSIS — I129 Hypertensive chronic kidney disease with stage 1 through stage 4 chronic kidney disease, or unspecified chronic kidney disease: Secondary | ICD-10-CM | POA: Diagnosis not present

## 2024-04-16 DIAGNOSIS — N1832 Chronic kidney disease, stage 3b: Secondary | ICD-10-CM | POA: Diagnosis not present

## 2024-04-16 DIAGNOSIS — I10 Essential (primary) hypertension: Secondary | ICD-10-CM | POA: Diagnosis not present

## 2024-04-16 DIAGNOSIS — R600 Localized edema: Secondary | ICD-10-CM | POA: Diagnosis not present

## 2024-04-22 DIAGNOSIS — I5032 Chronic diastolic (congestive) heart failure: Secondary | ICD-10-CM | POA: Diagnosis not present

## 2024-04-22 DIAGNOSIS — J449 Chronic obstructive pulmonary disease, unspecified: Secondary | ICD-10-CM | POA: Diagnosis not present

## 2024-04-30 DIAGNOSIS — H353221 Exudative age-related macular degeneration, left eye, with active choroidal neovascularization: Secondary | ICD-10-CM | POA: Diagnosis not present

## 2024-05-18 DIAGNOSIS — G8929 Other chronic pain: Secondary | ICD-10-CM | POA: Diagnosis not present

## 2024-05-18 DIAGNOSIS — M1711 Unilateral primary osteoarthritis, right knee: Secondary | ICD-10-CM | POA: Diagnosis not present

## 2024-05-18 DIAGNOSIS — M25561 Pain in right knee: Secondary | ICD-10-CM | POA: Diagnosis not present

## 2024-05-22 DIAGNOSIS — I5032 Chronic diastolic (congestive) heart failure: Secondary | ICD-10-CM | POA: Diagnosis not present

## 2024-06-30 ENCOUNTER — Emergency Department

## 2024-06-30 ENCOUNTER — Other Ambulatory Visit: Payer: Self-pay

## 2024-06-30 ENCOUNTER — Inpatient Hospital Stay
Admission: EM | Admit: 2024-06-30 | Discharge: 2024-07-06 | DRG: 871 | Disposition: A | Discharge status: Home / Self Care | Attending: Internal Medicine | Admitting: Internal Medicine

## 2024-06-30 DIAGNOSIS — J9621 Acute and chronic respiratory failure with hypoxia: Secondary | ICD-10-CM | POA: Diagnosis present

## 2024-06-30 DIAGNOSIS — I2510 Atherosclerotic heart disease of native coronary artery without angina pectoris: Secondary | ICD-10-CM | POA: Diagnosis present

## 2024-06-30 DIAGNOSIS — R6520 Severe sepsis without septic shock: Secondary | ICD-10-CM | POA: Diagnosis present

## 2024-06-30 DIAGNOSIS — Z8546 Personal history of malignant neoplasm of prostate: Secondary | ICD-10-CM

## 2024-06-30 DIAGNOSIS — Z87442 Personal history of urinary calculi: Secondary | ICD-10-CM

## 2024-06-30 DIAGNOSIS — E785 Hyperlipidemia, unspecified: Secondary | ICD-10-CM | POA: Diagnosis present

## 2024-06-30 DIAGNOSIS — J441 Chronic obstructive pulmonary disease with (acute) exacerbation: Secondary | ICD-10-CM | POA: Diagnosis present

## 2024-06-30 DIAGNOSIS — I130 Hypertensive heart and chronic kidney disease with heart failure and stage 1 through stage 4 chronic kidney disease, or unspecified chronic kidney disease: Secondary | ICD-10-CM | POA: Diagnosis present

## 2024-06-30 DIAGNOSIS — Z1152 Encounter for screening for COVID-19: Secondary | ICD-10-CM

## 2024-06-30 DIAGNOSIS — Z8616 Personal history of COVID-19: Secondary | ICD-10-CM

## 2024-06-30 DIAGNOSIS — J189 Pneumonia, unspecified organism: Secondary | ICD-10-CM | POA: Diagnosis present

## 2024-06-30 DIAGNOSIS — J440 Chronic obstructive pulmonary disease with acute lower respiratory infection: Secondary | ICD-10-CM | POA: Diagnosis present

## 2024-06-30 DIAGNOSIS — Z8679 Personal history of other diseases of the circulatory system: Secondary | ICD-10-CM | POA: Diagnosis not present

## 2024-06-30 DIAGNOSIS — A419 Sepsis, unspecified organism: Secondary | ICD-10-CM | POA: Diagnosis present

## 2024-06-30 DIAGNOSIS — I5032 Chronic diastolic (congestive) heart failure: Secondary | ICD-10-CM | POA: Diagnosis present

## 2024-06-30 DIAGNOSIS — N401 Enlarged prostate with lower urinary tract symptoms: Secondary | ICD-10-CM | POA: Diagnosis present

## 2024-06-30 DIAGNOSIS — N1832 Chronic kidney disease, stage 3b: Secondary | ICD-10-CM | POA: Diagnosis present

## 2024-06-30 DIAGNOSIS — Z96641 Presence of right artificial hip joint: Secondary | ICD-10-CM | POA: Diagnosis present

## 2024-06-30 DIAGNOSIS — J9601 Acute respiratory failure with hypoxia: Secondary | ICD-10-CM | POA: Diagnosis not present

## 2024-06-30 DIAGNOSIS — Z885 Allergy status to narcotic agent status: Secondary | ICD-10-CM

## 2024-06-30 DIAGNOSIS — K469 Unspecified abdominal hernia without obstruction or gangrene: Secondary | ICD-10-CM | POA: Diagnosis present

## 2024-06-30 DIAGNOSIS — J188 Other pneumonia, unspecified organism: Secondary | ICD-10-CM | POA: Diagnosis present

## 2024-06-30 DIAGNOSIS — Z7984 Long term (current) use of oral hypoglycemic drugs: Secondary | ICD-10-CM

## 2024-06-30 DIAGNOSIS — I2489 Other forms of acute ischemic heart disease: Secondary | ICD-10-CM | POA: Diagnosis present

## 2024-06-30 DIAGNOSIS — Z87891 Personal history of nicotine dependence: Secondary | ICD-10-CM | POA: Diagnosis not present

## 2024-06-30 DIAGNOSIS — Z8551 Personal history of malignant neoplasm of bladder: Secondary | ICD-10-CM

## 2024-06-30 LAB — PROCALCITONIN: Procalcitonin: 0.24 ng/mL

## 2024-06-30 LAB — CBC WITH DIFFERENTIAL/PLATELET
Abs Immature Granulocytes: 0.06 K/uL (ref 0.00–0.07)
Basophils Absolute: 0 K/uL (ref 0.0–0.1)
Basophils Relative: 0 %
Eosinophils Absolute: 0 K/uL (ref 0.0–0.5)
Eosinophils Relative: 0 %
HCT: 43.5 % (ref 39.0–52.0)
Hemoglobin: 13.6 g/dL (ref 13.0–17.0)
Immature Granulocytes: 1 %
Lymphocytes Relative: 7 %
Lymphs Abs: 0.9 K/uL (ref 0.7–4.0)
MCH: 31.2 pg (ref 26.0–34.0)
MCHC: 31.3 g/dL (ref 30.0–36.0)
MCV: 99.8 fL (ref 80.0–100.0)
Monocytes Absolute: 0.7 K/uL (ref 0.1–1.0)
Monocytes Relative: 5 %
Neutro Abs: 11.6 K/uL — ABNORMAL HIGH (ref 1.7–7.7)
Neutrophils Relative %: 87 %
Platelets: 188 K/uL (ref 150–400)
RBC: 4.36 MIL/uL (ref 4.22–5.81)
RDW: 13.4 % (ref 11.5–15.5)
WBC: 13.2 K/uL — ABNORMAL HIGH (ref 4.0–10.5)
nRBC: 0 % (ref 0.0–0.2)

## 2024-06-30 LAB — COMPREHENSIVE METABOLIC PANEL WITH GFR
ALT: 16 U/L (ref 0–44)
AST: 22 U/L (ref 15–41)
Albumin: 4.3 g/dL (ref 3.5–5.0)
Alkaline Phosphatase: 87 U/L (ref 38–126)
Anion gap: 11 (ref 5–15)
BUN: 52 mg/dL — ABNORMAL HIGH (ref 8–23)
CO2: 20 mmol/L — ABNORMAL LOW (ref 22–32)
Calcium: 9.5 mg/dL (ref 8.9–10.3)
Chloride: 108 mmol/L (ref 98–111)
Creatinine, Ser: 1.7 mg/dL — ABNORMAL HIGH (ref 0.61–1.24)
GFR, Estimated: 38 mL/min — ABNORMAL LOW
Glucose, Bld: 138 mg/dL — ABNORMAL HIGH (ref 70–99)
Potassium: 5.1 mmol/L (ref 3.5–5.1)
Sodium: 138 mmol/L (ref 135–145)
Total Bilirubin: 0.5 mg/dL (ref 0.0–1.2)
Total Protein: 7 g/dL (ref 6.5–8.1)

## 2024-06-30 LAB — TROPONIN T, HIGH SENSITIVITY
Troponin T High Sensitivity: 30 ng/L — ABNORMAL HIGH (ref 0–19)
Troponin T High Sensitivity: 43 ng/L — ABNORMAL HIGH (ref 0–19)

## 2024-06-30 LAB — BLOOD GAS, VENOUS
Acid-base deficit: 10.6 mmol/L — ABNORMAL HIGH (ref 0.0–2.0)
Bicarbonate: 15.2 mmol/L — ABNORMAL LOW (ref 20.0–28.0)
Delivery systems: POSITIVE
FIO2: 45 %
Mechanical Rate: 15
O2 Saturation: 79.8 %
PEEP: 5 cmH2O
Patient temperature: 37
Pressure control: 6 cmH2O
pCO2, Ven: 33 mmHg — ABNORMAL LOW (ref 44–60)
pH, Ven: 7.27 (ref 7.25–7.43)
pO2, Ven: 50 mmHg — ABNORMAL HIGH (ref 32–45)

## 2024-06-30 LAB — RESP PANEL BY RT-PCR (RSV, FLU A&B, COVID)  RVPGX2
Influenza A by PCR: NEGATIVE
Influenza B by PCR: NEGATIVE
Resp Syncytial Virus by PCR: NEGATIVE
SARS Coronavirus 2 by RT PCR: NEGATIVE

## 2024-06-30 LAB — EXPECTORATED SPUTUM ASSESSMENT W GRAM STAIN, RFLX TO RESP C

## 2024-06-30 LAB — LACTIC ACID, PLASMA
Lactic Acid, Venous: 2.3 mmol/L (ref 0.5–1.9)
Lactic Acid, Venous: 4.9 mmol/L (ref 0.5–1.9)
Lactic Acid, Venous: 3 mmol/L (ref 0.5–1.9)

## 2024-06-30 LAB — MAGNESIUM
Magnesium: 2.1 mg/dL (ref 1.7–2.4)
Magnesium: 1.9 mg/dL (ref 1.7–2.4)

## 2024-06-30 LAB — PRO BRAIN NATRIURETIC PEPTIDE: Pro Brain Natriuretic Peptide: 2326 pg/mL — ABNORMAL HIGH

## 2024-06-30 LAB — PHOSPHORUS: Phosphorus: 2.5 mg/dL (ref 2.5–4.6)

## 2024-06-30 MED ORDER — IPRATROPIUM BROMIDE 0.02 % IN SOLN
0.5000 mg | Freq: Four times a day (QID) | RESPIRATORY_TRACT | Status: DC
  Administered 2024-06-30 – 2024-07-02 (×9): 0.5 mg via RESPIRATORY_TRACT
  Filled 2024-06-30 (×10): qty 2.5

## 2024-06-30 MED ORDER — SODIUM CHLORIDE 0.9 % IV SOLN
2.0000 g | Freq: Once | INTRAVENOUS | Status: AC
  Administered 2024-06-30: 2 g via INTRAVENOUS
  Filled 2024-06-30: qty 20

## 2024-06-30 MED ORDER — TRAZODONE HCL 50 MG PO TABS: 25.0000 mg | ORAL_TABLET | Freq: Every evening | ORAL | Status: DC | PRN

## 2024-06-30 MED ORDER — BUDESON-GLYCOPYRROL-FORMOTEROL 160-9-4.8 MCG/ACT IN AERO
2.0000 | INHALATION_SPRAY | Freq: Two times a day (BID) | RESPIRATORY_TRACT | Status: AC
  Administered 2024-07-01 – 2024-07-06 (×11): 2 via RESPIRATORY_TRACT
  Filled 2024-06-30: qty 5.9

## 2024-06-30 MED ORDER — LORAZEPAM 0.5 MG PO TABS: 0.5000 mg | ORAL_TABLET | Freq: Every day | ORAL | Status: AC | PRN

## 2024-06-30 MED ORDER — METOPROLOL SUCCINATE ER 50 MG PO TB24
50.0000 mg | ORAL_TABLET | Freq: Every day | ORAL | Status: AC
  Administered 2024-07-01 – 2024-07-06 (×6): 50 mg via ORAL
  Filled 2024-06-30 (×6): qty 1

## 2024-06-30 MED ORDER — ENOXAPARIN SODIUM 30 MG/0.3ML IJ SOSY
30.0000 mg | PREFILLED_SYRINGE | INTRAMUSCULAR | Status: DC
  Administered 2024-06-30 – 2024-07-05 (×6): 30 mg via SUBCUTANEOUS
  Filled 2024-06-30 (×6): qty 0.3

## 2024-06-30 MED ORDER — METHYLPREDNISOLONE SODIUM SUCC 40 MG IJ SOLR
40.0000 mg | Freq: Two times a day (BID) | INTRAMUSCULAR | Status: DC
  Administered 2024-06-30 – 2024-07-01 (×3): 40 mg via INTRAVENOUS
  Filled 2024-06-30 (×4): qty 1

## 2024-06-30 MED ORDER — IPRATROPIUM-ALBUTEROL 0.5-2.5 (3) MG/3ML IN SOLN
6.0000 mL | Freq: Once | RESPIRATORY_TRACT | Status: AC
  Administered 2024-06-30: 6 mL via RESPIRATORY_TRACT
  Filled 2024-06-30: qty 3

## 2024-06-30 MED ORDER — TRAMADOL HCL 50 MG PO TABS: 50.0000 mg | ORAL_TABLET | ORAL | Status: DC | PRN

## 2024-06-30 MED ORDER — BENZONATATE 100 MG PO CAPS: 100.0000 mg | ORAL_CAPSULE | Freq: Three times a day (TID) | ORAL | Status: DC | PRN

## 2024-06-30 MED ORDER — IOHEXOL 350 MG/ML SOLN
65.0000 mL | Freq: Once | INTRAVENOUS | Status: AC | PRN
  Administered 2024-06-30: 65 mL via INTRAVENOUS

## 2024-06-30 MED ORDER — SODIUM CHLORIDE 0.9 % IV SOLN
100.0000 mg | Freq: Two times a day (BID) | INTRAVENOUS | Status: DC
  Administered 2024-07-01 – 2024-07-03 (×4): 100 mg via INTRAVENOUS
  Filled 2024-06-30 (×7): qty 100

## 2024-06-30 MED ORDER — SODIUM CHLORIDE 0.9 % IV SOLN
2.0000 g | INTRAVENOUS | Status: AC
  Administered 2024-07-01 – 2024-07-05 (×5): 2 g via INTRAVENOUS
  Filled 2024-06-30 (×5): qty 20

## 2024-06-30 MED ORDER — SODIUM CHLORIDE 0.9 % IV SOLN
500.0000 mg | Freq: Once | INTRAVENOUS | Status: AC
  Administered 2024-06-30: 500 mg via INTRAVENOUS
  Filled 2024-06-30: qty 5

## 2024-06-30 MED ORDER — LEVALBUTEROL HCL 0.63 MG/3ML IN NEBU: 0.6300 mg | INHALATION_SOLUTION | Freq: Four times a day (QID) | RESPIRATORY_TRACT | Status: DC | PRN

## 2024-06-30 MED ORDER — GUAIFENESIN ER 600 MG PO TB12
600.0000 mg | ORAL_TABLET | Freq: Two times a day (BID) | ORAL | Status: DC
  Administered 2024-06-30 – 2024-07-01 (×4): 600 mg via ORAL
  Filled 2024-06-30 (×4): qty 1

## 2024-06-30 MED ORDER — ONDANSETRON HCL 4 MG/2ML IJ SOLN: 4.0000 mg | Freq: Four times a day (QID) | INTRAMUSCULAR | Status: AC | PRN

## 2024-06-30 MED ORDER — ASPIRIN 81 MG PO TBEC
81.0000 mg | DELAYED_RELEASE_TABLET | Freq: Every day | ORAL | Status: DC
  Administered 2024-07-01 – 2024-07-06 (×6): 81 mg via ORAL
  Filled 2024-06-30 (×6): qty 1

## 2024-06-30 MED ORDER — ONDANSETRON HCL 4 MG PO TABS: 4.0000 mg | ORAL_TABLET | Freq: Four times a day (QID) | ORAL | Status: AC | PRN

## 2024-06-30 MED ORDER — TAMSULOSIN HCL 0.4 MG PO CAPS
0.4000 mg | ORAL_CAPSULE | Freq: Every day | ORAL | Status: AC
  Administered 2024-06-30 – 2024-07-06 (×7): 0.4 mg via ORAL
  Filled 2024-06-30 (×7): qty 1

## 2024-06-30 MED ORDER — METHYLPREDNISOLONE SODIUM SUCC 125 MG IJ SOLR
125.0000 mg | Freq: Once | INTRAMUSCULAR | Status: AC
  Administered 2024-06-30: 125 mg via INTRAVENOUS
  Filled 2024-06-30: qty 2

## 2024-06-30 MED ORDER — ATORVASTATIN CALCIUM 20 MG PO TABS
40.0000 mg | ORAL_TABLET | Freq: Every day | ORAL | Status: AC
  Administered 2024-07-01 – 2024-07-06 (×6): 40 mg via ORAL
  Filled 2024-06-30 (×6): qty 2

## 2024-06-30 MED ORDER — SODIUM CHLORIDE 0.9 % IV BOLUS
1000.0000 mL | Freq: Once | INTRAVENOUS | Status: AC
  Administered 2024-06-30: 1000 mL via INTRAVENOUS

## 2024-06-30 MED ORDER — SENNOSIDES-DOCUSATE SODIUM 8.6-50 MG PO TABS: 1.0000 | ORAL_TABLET | Freq: Every evening | ORAL | Status: DC | PRN

## 2024-06-30 NOTE — ED Notes (Signed)
 Printer was not working so a printed EKG was not avaible, I told the doctor(Smith) that it was in the patient's chart,he look at EKG.

## 2024-06-30 NOTE — Progress Notes (Signed)
 PT Cancellation Note  Patient Details Name: Ruben Green MRN: [id number] DOB: [birth year]   Cancelled Treatment:    Reason Eval/Treat Not Completed: Medical issues which prohibited therapy (Per chart review, pt has been tachy most of morning, rates in 130s-140s, especially unsafe for age. Will continue to follow and evaluate once pt is able to safely tolerate exertion.)  11:30 AM, 06/30/2024 [name] Linear, PT, DPT Physical Therapist - [facility]  [phone] (ASCOM)     [name] C 06/30/2024, 11:30 AM

## 2024-06-30 NOTE — ED Notes (Signed)
 To CT with RT and returned

## 2024-06-30 NOTE — H&P (Signed)
 " History and Physical    Ruben Green FMW:[id number] DOB: 1933-07-06 DOA: 06/30/2024  PCP: Sadie Manna, MD (Confirm with patient/family/NH records and if not entered, this has to be entered at [facility] point of entry) Patient coming from: Home  I have personally briefly reviewed patient's old medical records in [facility]  Chief Complaint: Cough, wheezing, shortness of breath  HPI: Ruben Green is a 89 y.o. male with medical history significant of HTN, chronic HFpEF, COPD, chronic hypoxic respiratory failure on 3 L admits needed, CKD stage IIIa, bladder cancer status post BCG treatment, prostate cancer, AAA s/p repair, chronic RBBB, presented with worsening of cough wheezing shortness of breath and low oxygen .  Patient is having trouble to answer questions because he is on BiPAP, will history given by wife at bedside.  Symptoms started 4 weeks ago when patient started to have cough wheezing and shortness of breath.  Went to see PCP was diagnosed with pneumonia and was treated with 2 rounds of p.o. antibiotics including azithromycin  and another one wife could not tell the name.  He azithromycin  2 days ago.  Despite, his breathing symptoms are not improving, wife checked his oxygen  last night found O2 saturation in the 70s while on 2 L oxygen  and called EMS.  EMS arrived and found patient in hypoxia 85% on 4 L.  Wife also reported that patient has a weak cough, but denied any dysphagia, no cough or choking after eat or drink.  No fever, no complaint of chest pains. ED Course: Afebrile, tachycardia, tachycardia heart rate in the 120-140s, sinus tachycardia on EKG, blood pressure 140/60 O2 saturation 95% on BiPAP with 45% FiO2.  CTA negative for PE but bilateral multifocal pneumonia on bilateral lower lobes.  Blood work showed WBC 13.2 hemoglobin 13.6 BUN 52 creatinine 1.7 glucose of 138.  ABG 7.27/33/50.  Patient was given IV Solu-Medrol , ceftriaxone  and azithromycin  in the  ED.  Review of Systems: As per HPI otherwise 14 point review of systems negative.    Past Medical History:  Diagnosis Date   AAA (abdominal aortic aneurysm)    a.) s/p repair in 2005   Anemia    Anginal pain    Anxiety    Arthritis    B12 deficiency    Bilateral cataracts    a.) s/p BILATERAL extractions in 2018   BPH with obstruction/lower urinary tract symptoms    CAD (coronary artery disease)    Carotid artery stenosis    a.) s/p CEA on the RIGHT   CHF (congestive heart failure) (HCC)    COPD (chronic obstructive pulmonary disease) (HCC)    Diastolic dysfunction 07/29/2019   a.)  TTE 07/29/2019: EF 50-55%; LA mildly enlarged; G1DD.   DOE (dyspnea on exertion)    Elevated PSA    Environmental and seasonal allergies    History of 2019 novel coronavirus disease (COVID-19)    History of kidney stones    HLD (hyperlipidemia)    HOH (hard of hearing)    HTN (hypertension)    Incomplete bladder emptying    Macular degeneration    Nausea vomiting and diarrhea 09/16/2023   Prostate cancer (HCC)    RBBB (right bundle branch block)    Valvular insufficiency    a.) TTE 07/29/2019: LVEF 50-55%; LA mild enlarged; triv AR/PR, mild MR, mod TR.    Past Surgical History:  Procedure Laterality Date   ABDOMINAL AORTIC ANEURYSM REPAIR     CATARACT EXTRACTION W/PHACO Right 09/25/2016  Procedure: CATARACT EXTRACTION PHACO AND INTRAOCULAR LENS PLACEMENT (IOC);  Surgeon: Elsie Carmine, MD;  Location: [facility] ORS;  Service: Ophthalmology;  Laterality: Right;  US  01:01 AP% 17.4 CDE 10.75 Fluid pack lot # [id number]   CATARACT EXTRACTION W/PHACO Left 10/16/2016   Procedure: CATARACT EXTRACTION PHACO AND INTRAOCULAR LENS PLACEMENT (IOC) Suture placed in Left eye;  Surgeon: Elsie Carmine, MD;  Location: [facility] ORS;  Service: Ophthalmology;  Laterality: Left;  US  2:06.8 AP% 22.2 CDE 28.17 Fluid pack lot # [id number]   COLONOSCOPY WITH PROPOFOL  N/A 03/31/2019   Procedure: COLONOSCOPY WITH  PROPOFOL ;  Surgeon: Gaylyn Gladis PENNER, MD;  Location: [facility];  Service: Endoscopy;  Laterality: N/A;   COLONOSCOPY WITH PROPOFOL  N/A 09/09/2020   Procedure: COLONOSCOPY WITH PROPOFOL ;  Surgeon: Maryruth Ole DASEN, MD;  Location: [facility] ENDOSCOPY;  Service: Endoscopy;  Laterality: N/A;   CYSTOSCOPY W/ RETROGRADES Bilateral 02/23/2020   Procedure: CYSTOSCOPY WITH RETROGRADE PYELOGRAM;  Surgeon: Twylla Glendia BROCKS, MD;  Location: [facility] ORS;  Service: Urology;  Laterality: Bilateral;   CYSTOSCOPY WITH BIOPSY N/A 02/23/2020   Procedure: CYSTOSCOPY WITH BIOPSY;  Surgeon: Twylla Glendia BROCKS, MD;  Location: [facility] ORS;  Service: Urology;  Laterality: N/A;   EYE SURGERY     HERNIA REPAIR     x 2   INGUINAL HERNIA REPAIR Right 12/02/2015   Procedure: HERNIA REPAIR INGUINAL ADULT;  Surgeon: Larinda Unknown Sharps, MD;  Location: [facility] ORS;  Service: General;  Laterality: Right;   PROSTATE SURGERY  2012   Right Carotid Endarterectomy     TOTAL HIP ARTHROPLASTY Right 08/01/2021   Procedure: TOTAL HIP ARTHROPLASTY;  Surgeon: Edie Norleen PARAS, MD;  Location: [facility] ORS;  Service: Orthopedics;  Laterality: Right;     reports that he quit smoking about 25 years ago. His smoking use included cigarettes. He has quit using smokeless tobacco. He reports that he does not currently use alcohol . He reports that he does not use drugs.  Allergies[1]  Family History  Problem Relation Age of Onset   Cancer Brother        2000   Prostate cancer Neg Hx    Bladder Cancer Neg Hx    Kidney cancer Neg Hx     Prior to Admission medications  Medication Sig Start Date End Date Taking? Authorizing Provider  acetaminophen  (TYLENOL ) 500 MG tablet Take 1-2 tablets (500-1,000 mg total) by mouth every 6 (six) hours as needed for mild pain. 08/04/21   Kip Lynwood Double, PA-C  albuterol  (VENTOLIN  HFA) [phone] Base) MCG/ACT inhaler Inhale 2 puffs into the lungs every 4 (four) hours as needed. 03/24/23   Jacolyn Pae, MD  aspirin  EC 81  MG tablet Take 81 mg by mouth. 12/10/23 12/09/24  [provider]  atorvastatin  (LIPITOR) 40 MG tablet Take 1 tablet (40 mg total) by mouth daily. 10/18/23   Hudson, Caralyn, PA-C  cyanocobalamin  (VITAMIN B12) 1000 MCG tablet Take 1,000 mcg by mouth. 12/09/23   [provider]  empagliflozin  (JARDIANCE ) 10 MG TABS tablet Take 1 tablet (10 mg total) by mouth daily before breakfast. 10/18/23   Hudson, Caralyn, PA-C  ferrous sulfate 325 (65 FE) MG tablet Take 325 mg by mouth. 01/14/23 03/30/24  [provider]  Fluticasone -Umeclidin-Vilant (TRELEGY ELLIPTA) 100-62.5-25 MCG/INH AEPB Inhale 2 puffs into the lungs daily. 09/01/19   [provider]  ipratropium-albuterol  (DUONEB) 0.5-2.5 (3) MG/3ML SOLN Inhale 3 mLs into the lungs. 01/07/24 01/01/25  [provider]  LORazepam  (ATIVAN ) 0.5 MG tablet Take 0.5 mg by  mouth daily as needed for anxiety. 10/23/21   [provider]  metoprolol  succinate (TOPROL -XL) 50 MG 24 hr tablet Take 1 tablet (50 mg total) by mouth daily. 10/18/23   Hudson, Caralyn, PA-C  tamsulosin  (FLOMAX ) 0.4 MG CAPS capsule Take 1 capsule (0.4 mg total) by mouth daily. 10/18/23   Hudson, Caralyn, PA-C  torsemide  (DEMADEX ) 10 MG tablet Take 1 tablet (10 mg total) by mouth daily. 10/18/23 10/17/24  Hudson, Caralyn, PA-C  traMADol  (ULTRAM ) 50 MG tablet Take 1 tablet (50 mg total) by mouth every 4 (four) hours as needed for moderate pain (pain score 4-6) or severe pain (pain score 7-10). 09/19/23   Trudy Anthony HERO, MD    Physical Exam: Vitals:   06/30/24 0430 06/30/24 0513 06/30/24 0600 06/30/24 0628  BP: (!) 155/94  (!) 142/68 (!) 142/68  Pulse: (!) 140 (!) 138 (!) 132 (!) 128  Resp: (!) 24 (!) 24 (!) 26   Temp:    98.2 F (36.8 C)  TempSrc:    Axillary  SpO2: 96% 96% 95% 95%  Weight:      Height:        Constitutional: NAD, calm, comfortable Vitals:   06/30/24 0430 06/30/24 0513 06/30/24 0600 06/30/24 0628  BP: (!) 155/94  (!) 142/68 (!)  142/68  Pulse: (!) 140 (!) 138 (!) 132 (!) 128  Resp: (!) 24 (!) 24 (!) 26   Temp:    98.2 F (36.8 C)  TempSrc:    Axillary  SpO2: 96% 96% 95% 95%  Weight:      Height:       Eyes: PERRL, lids and conjunctivae normal ENMT: Mucous membranes are moist. Posterior pharynx clear of any exudate or lesions.Normal dentition.  Neck: normal, supple, no masses, no thyromegaly Respiratory: Diminished breathing sound bilaterally, diffused wheezing, scattered bilateral lower field crackles, increasing respiratory effort. No accessory muscle use.  Cardiovascular: Regular rate and rhythm, no murmurs / rubs / gallops. No extremity edema. 2+ pedal pulses. No carotid bruits.  Abdomen: no tenderness, no masses palpated. No hepatosplenomegaly. Bowel sounds positive.  Musculoskeletal: no clubbing / cyanosis. No joint deformity upper and lower extremities. Good ROM, no contractures. Normal muscle tone.  Skin: no rashes, lesions, ulcers. No induration Neurologic: CN 2-12 grossly intact. Sensation intact, DTR normal. Strength 5/5 in all 4.  Psychiatric: Normal judgment and insight. Alert and oriented x 3. Normal mood.   Labs on Admission: I have personally reviewed following labs and imaging studies  CBC: Recent Labs  Lab 06/30/24 0247  WBC 13.2*  NEUTROABS 11.6*  HGB 13.6  HCT 43.5  MCV 99.8  PLT 188   Basic Metabolic Panel: Recent Labs  Lab 06/30/24 0247  NA 138  K 5.1  CL 108  CO2 20*  GLUCOSE 138*  BUN 52*  CREATININE 1.70*  CALCIUM  9.5  MG 2.1   GFR: Estimated Creatinine Clearance: 23.2 mL/min (A) (by C-G formula based on SCr of 1.7 mg/dL (H)). Liver Function Tests: Recent Labs  Lab 06/30/24 0247  AST 22  ALT 16  ALKPHOS 87  BILITOT 0.5  PROT 7.0  ALBUMIN 4.3   No results for input(s): LIPASE, AMYLASE in the last 168 hours. No results for input(s): AMMONIA in the last 168 hours. Coagulation Profile: No results for input(s): INR, PROTIME in the last 168  hours. Cardiac Enzymes: No results for input(s): CKTOTAL, CKMB, CKMBINDEX, TROPONINI in the last 168 hours. BNP (last 3 results) Recent Labs    06/30/24 0247  PROBNP 2,326.0*   HbA1C: No results for input(s): HGBA1C in the last 72 hours. CBG: No results for input(s): GLUCAP in the last 168 hours. Lipid Profile: No results for input(s): CHOL, HDL, LDLCALC, TRIG, CHOLHDL, LDLDIRECT in the last 72 hours. Thyroid  Function Tests: No results for input(s): TSH, T4TOTAL, FREET4, T3FREE, THYROIDAB in the last 72 hours. Anemia Panel: No results for input(s): VITAMINB12, FOLATE, FERRITIN, TIBC, IRON , RETICCTPCT in the last 72 hours. Urine analysis:    Component Value Date/Time   COLORURINE YELLOW (A) 09/16/2023 1543   APPEARANCEUR Clear 03/11/2024 0759   LABSPEC 1.011 09/16/2023 1543   PHURINE 5.0 09/16/2023 1543   GLUCOSEU Trace (A) 03/11/2024 0759   HGBUR SMALL (A) 09/16/2023 1543   BILIRUBINUR Negative 03/11/2024 0759   KETONESUR NEGATIVE 09/16/2023 1543   PROTEINUR Negative 03/11/2024 0759   PROTEINUR NEGATIVE 09/16/2023 1543   NITRITE Negative 03/11/2024 0759   NITRITE NEGATIVE 09/16/2023 1543   LEUKOCYTESUR Negative 03/11/2024 0759   LEUKOCYTESUR LARGE (A) 09/16/2023 1543    Radiological Exams on Admission: CT Angio Chest PE W and/or Wo Contrast Result Date: 06/30/2024 EXAM: CTA of the Chest with contrast for PE 06/30/2024 05:08:26 AM TECHNIQUE: CTA of the chest was performed without and with the administration of 65 mL of intravenous iohexol  (OMNIPAQUE ) 350 MG/ML injection. Multiplanar reformatted images are provided for review. MIP images are provided for review. Automated exposure control, iterative reconstruction, and/or weight based adjustment of the mA/kV was utilized to reduce the radiation dose to as low as reasonably achievable. COMPARISON: 09/12/2024 CLINICAL HISTORY: eval PE, PNA. hypoxic failure. continued chest pain and  tachycardia FINDINGS: PULMONARY ARTERIES: Pulmonary arteries are adequately opacified for evaluation. No pulmonary embolism. Main pulmonary artery is normal in caliber. MEDIASTINUM: Aortic atherosclerosis and multivessel coronary artery calcifications. Moderate-sized hiatal hernia. The heart and pericardium demonstrate no acute abnormality. LYMPH NODES: Subcarinal lymph node measures 1.6 cm, image 74/4. LUNGS AND PLEURA: Multifocal bilateral airspace consolidation is identified, including both lower lobes, lingula, and right middle lobe. This is most severe within the left lower lobe. Emphysema and diffuse bronchial wall thickening. No pleural effusion or pneumothorax. Differential diagnosis for the airspace consolidation includes pneumonia, acute bronchitis, or exacerbation of chronic obstructive pulmonary disease (COPD). Clinical correlation and follow-up imaging to assess resolution are recommended. UPPER ABDOMEN: A midline ventral abdominal wall hernia is noted, which contains nonobstructed loops of transverse colon. Several Bosniak Class I cysts are noted arising off the upper pole of the right kidney measuring up to 1.6 cm. No follow-up imaging recommended. SOFT TISSUES AND BONES: Unchanged appearance of chronic severe compression deformity involving the T7 vertebra. Mild superior endplate deformity involving the T8 vertebra is also unchanged. IMPRESSION: 1. No evidence of pulmonary embolism. 2. Multifocal bilateral airspace consolidation, most severe in the left lower lobe, most consistent with multifocal pneumonia, with aspiration also in the differential diagnosis. Recommend follow-up chest imaging after treatment to document resolution. 3. Emphysema and diffuse bronchial wall thickening, which may contribute to hypoxic respiratory failure. 4. Enlarged subcarinal lymph node measuring 1.6 cm, favored reactive in the setting of pneumonia. Recommend follow-up chest CT in approximately 3 months to confirm  improvement or resolution. Electronically signed by: Waddell Calk MD MD 06/30/2024 05:27 AM EST RP Workstation: [id number]   DG Chest Portable 1 View Result Date: 06/30/2024 EXAM: 1 VIEW(S) XRAY OF THE CHEST 06/30/2024 03:03:00 AM COMPARISON: 10/16/2023 CLINICAL HISTORY: SOB, CHF/COPD exacerbation FINDINGS: LUNGS AND PLEURA: Interstitial changes are noted in the right base, new from the  prior exam. Left retrocardiac density consistent with focal infiltrate is noted. Small left pleural effusion. Background emphysema. No pneumothorax. HEART AND MEDIASTINUM: Aortic atherosclerosis. No acute abnormality of the cardiac silhouette. BONES AND SOFT TISSUES: No acute osseous abnormality. IMPRESSION: 1. Left retrocardiac opacity suspicious for focal infiltrate. 2. Small left pleural effusion. 3. Interstitial changes in the right base, new from the prior exam. 4. Background emphysema. Electronically signed by: Oneil Devonshire MD MD 06/30/2024 03:10 AM EST RP Workstation: [id number]    EKG: Independently reviewed.  Sinus tachycardia, chronic RBBB, frequent PVCs, no acute ST changes.  Assessment/Plan Principal Problem:   Acute respiratory failure with hypoxia (HCC) Active Problems:   Multifocal pneumonia   Pneumonia  (please populate well all problems here in Problem List. (For example, if patient is on BP meds at home and you resume or decide to hold them, it is a problem that needs to be her. Same for CAD, COPD, HLD and so on)  Sepsis without acute endorgan damage - Sepsis as evidenced by tachycardia, leukocytosis, source infection is bilateral pneumonia. - Received 1 L of IV bolus in the ED.  Patient appears to be euvolemic at this point.  Given his history of CHF, do not continue maintenance fluid.  Recheck potassium level - Antibiotics as below  Acute on chronic hypoxic respiratory failure Bilateral multifocal pneumonia, rule out aspiration - Continue ceftriaxone , change azithromycin  to doxycycline  as  patient has just completed 7 days course of Z-Pak 2 days ago. - Pulmonary toilet, incentive spirometry - Culture sputum - Breathing treatment - Speech therapist evaluation  Acute COPD exacerbation -Admit to PCU to wean off BiPAP - Avoid albuterol  given that patient has tachycardia - Atrovent  plus as needed Xopenex  - IV Solu-Medrol  - Continue ICS and LABA  CKD stage III A - Appears to be euvolemic - Creatinine level stable  HTN - Resume metoprolol   Deconditioning - PT evaluation  DVT prophylaxis: Lovenox  Code Status: Full code Family Communication: Wife at bedside Disposition Plan: Patient sick with pneumonia failed outpatient management now into sepsis, requiring IV antibiotics, expect more than 2 midnight hospital stay. Consults called: None Admission status: PCU admit   Cort ONEIDA Mana MD [facility] Pager [phone]  06/30/2024, 8:59 AM       [1]  Allergies Allergen Reactions   Oxycodone  Other (See Comments)    Caused hallucinations/does not want anymore   No Known Allergies    "

## 2024-06-30 NOTE — ED Provider Notes (Signed)
 "  [facility] Provider Note    Event Date/Time   First MD Initiated Contact with Patient 06/30/24 [phone]     (approximate)   History   Shortness of Breath (Patient from home with increasing sob, uses 2LNC O2 for sleep, O2 sat per EMS 85% on 4LNC enroute, arrives to ED O2 Sat 95% on 4LNC, afib, hx htn, COPD)   HPI  Ruben Green is a 89 y.o. male who presents to the ED for evaluation of Shortness of Breath (Patient from home with increasing sob, uses 2LNC O2 for sleep, O2 sat per EMS 85% on 4LNC enroute, arrives to ED O2 Sat 95% on 4LNC, afib, hx htn, COPD)   Review a cardiology clinic visit from September.  History of HTN, COPD, CHF  Patient presents to the ED for the ration of shortness of breath and cough over the past 2 days.  Found to be hypoxic in the 70s on room air   Physical Exam   Triage Vital Signs: ED Triage Vitals  Encounter Vitals Group     BP      Girls Systolic BP Percentile      Girls Diastolic BP Percentile      Boys Systolic BP Percentile      Boys Diastolic BP Percentile      Pulse      Resp      Temp      Temp src      SpO2      Weight      Height      Head Circumference      Peak Flow      Pain Score      Pain Loc      Pain Education      Exclude from Growth Chart     Most recent vital signs: Vitals:   06/30/24 0430 06/30/24 0513  BP: (!) 155/94   Pulse: (!) 140 (!) 138  Resp: (!) 24 (!) 24  Temp:    SpO2: 96% 96%    General: Awake, no distress.  Clearly short of breath but not quite tripoding -looking much better after starting BiPAP CV:  Good peripheral perfusion.  Tachycardic and regular Resp:  Tachypneic to about 30, wheezing throughout and slightly decreased airflow Abd:  No distention.  Soft and nontender MSK:  No deformity noted.  Neuro:  No focal deficits appreciated. Other:     ED Results / Procedures / Treatments   Labs (all labs ordered are listed, but only abnormal results are  displayed) Labs Reviewed  CBC WITH DIFFERENTIAL/PLATELET - Abnormal; Notable for the following components:      Result Value   WBC 13.2 (*)    Neutro Abs 11.6 (*)    All other components within normal limits  COMPREHENSIVE METABOLIC PANEL WITH GFR - Abnormal; Notable for the following components:   CO2 20 (*)    Glucose, Bld 138 (*)    BUN 52 (*)    Creatinine, Ser 1.70 (*)    GFR, Estimated 38 (*)    All other components within normal limits  PRO BRAIN NATRIURETIC PEPTIDE - Abnormal; Notable for the following components:   Pro Brain Natriuretic Peptide 2,326.0 (*)    All other components within normal limits  TROPONIN T, HIGH SENSITIVITY - Abnormal; Notable for the following components:   Troponin T High Sensitivity 30 (*)    All other components within normal limits  RESP PANEL BY RT-PCR (RSV, FLU  A&B, COVID)  RVPGX2  CULTURE, BLOOD (ROUTINE X 2)  CULTURE, BLOOD (ROUTINE X 2)  MAGNESIUM   LACTIC ACID, PLASMA  PROCALCITONIN  BLOOD GAS, VENOUS  TROPONIN T, HIGH SENSITIVITY    EKG Sinus tachycardia with frequent PVCs in a bigeminy pattern, right bundle branch block, no STEMI.  RADIOLOGY CTA chest interpreted by me without PE, signs of pneumonia present  Official radiology report(s): CT Angio Chest PE W and/or Wo Contrast Result Date: 06/30/2024 EXAM: CTA of the Chest with contrast for PE 06/30/2024 05:08:26 AM TECHNIQUE: CTA of the chest was performed without and with the administration of 65 mL of intravenous iohexol  (OMNIPAQUE ) 350 MG/ML injection. Multiplanar reformatted images are provided for review. MIP images are provided for review. Automated exposure control, iterative reconstruction, and/or weight based adjustment of the mA/kV was utilized to reduce the radiation dose to as low as reasonably achievable. COMPARISON: 09/12/2024 CLINICAL HISTORY: eval PE, PNA. hypoxic failure. continued chest pain and tachycardia FINDINGS: PULMONARY ARTERIES: Pulmonary arteries are  adequately opacified for evaluation. No pulmonary embolism. Main pulmonary artery is normal in caliber. MEDIASTINUM: Aortic atherosclerosis and multivessel coronary artery calcifications. Moderate-sized hiatal hernia. The heart and pericardium demonstrate no acute abnormality. LYMPH NODES: Subcarinal lymph node measures 1.6 cm, image 74/4. LUNGS AND PLEURA: Multifocal bilateral airspace consolidation is identified, including both lower lobes, lingula, and right middle lobe. This is most severe within the left lower lobe. Emphysema and diffuse bronchial wall thickening. No pleural effusion or pneumothorax. Differential diagnosis for the airspace consolidation includes pneumonia, acute bronchitis, or exacerbation of chronic obstructive pulmonary disease (COPD). Clinical correlation and follow-up imaging to assess resolution are recommended. UPPER ABDOMEN: A midline ventral abdominal wall hernia is noted, which contains nonobstructed loops of transverse colon. Several Bosniak Class I cysts are noted arising off the upper pole of the right kidney measuring up to 1.6 cm. No follow-up imaging recommended. SOFT TISSUES AND BONES: Unchanged appearance of chronic severe compression deformity involving the T7 vertebra. Mild superior endplate deformity involving the T8 vertebra is also unchanged. IMPRESSION: 1. No evidence of pulmonary embolism. 2. Multifocal bilateral airspace consolidation, most severe in the left lower lobe, most consistent with multifocal pneumonia, with aspiration also in the differential diagnosis. Recommend follow-up chest imaging after treatment to document resolution. 3. Emphysema and diffuse bronchial wall thickening, which may contribute to hypoxic respiratory failure. 4. Enlarged subcarinal lymph node measuring 1.6 cm, favored reactive in the setting of pneumonia. Recommend follow-up chest CT in approximately 3 months to confirm improvement or resolution. Electronically signed by: Waddell Calk MD  MD 06/30/2024 05:27 AM EST RP Workstation: [id number]   DG Chest Portable 1 View Result Date: 06/30/2024 EXAM: 1 VIEW(S) XRAY OF THE CHEST 06/30/2024 03:03:00 AM COMPARISON: 10/16/2023 CLINICAL HISTORY: SOB, CHF/COPD exacerbation FINDINGS: LUNGS AND PLEURA: Interstitial changes are noted in the right base, new from the prior exam. Left retrocardiac density consistent with focal infiltrate is noted. Small left pleural effusion. Background emphysema. No pneumothorax. HEART AND MEDIASTINUM: Aortic atherosclerosis. No acute abnormality of the cardiac silhouette. BONES AND SOFT TISSUES: No acute osseous abnormality. IMPRESSION: 1. Left retrocardiac opacity suspicious for focal infiltrate. 2. Small left pleural effusion. 3. Interstitial changes in the right base, new from the prior exam. 4. Background emphysema. Electronically signed by: Oneil Devonshire MD MD 06/30/2024 03:10 AM EST RP Workstation: [id number]    PROCEDURES and INTERVENTIONS:  .1-3 Lead EKG Interpretation  Performed by: Claudene Rover, MD Authorized by: Claudene Rover, MD     Interpretation: abnormal  ECG rate:  132   ECG rate assessment: tachycardic     Rhythm: sinus tachycardia     Ectopy: none     Conduction: normal   .Critical Care  Performed by: Claudene Rover, MD Authorized by: Claudene Rover, MD   Critical care provider statement:    Critical care time (minutes):  30   Critical care time was exclusive of:  Separately billable procedures and treating other patients   Critical care was necessary to treat or prevent imminent or life-threatening deterioration of the following conditions:  Sepsis and respiratory failure   Critical care was time spent personally by me on the following activities:  Development of treatment plan with patient or surrogate, discussions with consultants, evaluation of patient's response to treatment, examination of patient, ordering and review of laboratory studies, ordering and review of radiographic studies,  ordering and performing treatments and interventions, pulse oximetry, re-evaluation of patient's condition and review of old charts   Medications  cefTRIAXone  (ROCEPHIN ) 2 g in sodium chloride  0.9 % 100 mL IVPB (has no administration in time range)  azithromycin  (ZITHROMAX ) 500 mg in sodium chloride  0.9 % 250 mL IVPB (has no administration in time range)  sodium chloride  0.9 % bolus 1,000 mL (has no administration in time range)  methylPREDNISolone  sodium succinate (SOLU-MEDROL ) 125 mg/2 mL injection 125 mg (125 mg Intravenous Given 06/30/24 0300)  ipratropium-albuterol  (DUONEB) 0.5-2.5 (3) MG/3ML nebulizer solution 6 mL (6 mLs Nebulization Given 06/30/24 0300)  iohexol  (OMNIPAQUE ) 350 MG/ML injection 65 mL (65 mLs Intravenous Contrast Given 06/30/24 0459)     IMPRESSION / MDM / ASSESSMENT AND PLAN / ED COURSE  I reviewed the triage vital signs and the nursing notes.  Differential diagnosis includes, but is not limited to, ACS, PTX, PNA, muscle strain/spasm, PE, dissection, anxiety, pleural effusion  {Patient presents with symptoms of an acute illness or injury that is potentially life-threatening.  Patient presents with hypoxic respiratory failure with signs of COPD exacerbation and sepsis pneumonia requiring medical admission.  Persistently tachycardic.  CTA chest without PE.  Leukocytosis, renal dysfunction near baseline, mildly elevated troponin likely due to his respiratory status.  Draw cultures and provide CAP coverage antibiotics, consult medicine for admission  Clinical Course as of 06/30/24 0534  Tue Jun 30, 2024  9656 Reassessed, discussed workup thus far. We're struggling to get accurate pulse ox readings. Will call RT for bipap [DS]  0355 Looks better on the BiPAP [DS]  0440 reassessed [DS]  0525 reassessed [DS]    Clinical Course User Index [DS] Claudene Rover, MD     FINAL CLINICAL IMPRESSION(S) / ED DIAGNOSES   Final diagnoses:  Acute respiratory failure with hypoxia  (HCC)  COPD exacerbation (HCC)  Sepsis due to pneumonia [facility])     Rx / DC Orders   ED Discharge Orders     None        Note:  This document was prepared using Dragon voice recognition software and may include unintentional dictation errors.   Claudene Rover, MD 06/30/24 [phone]  "

## 2024-06-30 NOTE — Progress Notes (Signed)
 Patient transported on Bipap to CT scan and back. No complications. SpO2 96%. Will continue to monitor.

## 2024-06-30 NOTE — Progress Notes (Signed)
 SLP Cancellation Note  Patient Details Name: Ruben Green MRN: [id number] DOB: [birth year]   Cancelled treatment:       Reason Eval/Treat Not Completed: Medical issues which prohibited therapy  Pt currently on BiPAP. Will attempt later or at next available time.   Makia Bossi 06/30/2024, 12:27 PM

## 2024-06-30 NOTE — ED Triage Notes (Signed)
 Patient from home with increasing sob, uses 2LNC O2 for sleep, O2 sat per EMS 85% on 4LNC enroute, arrives to ED O2 Sat 95% on 4LNC, afib, hx htn, COPD

## 2024-07-01 DIAGNOSIS — J9601 Acute respiratory failure with hypoxia: Secondary | ICD-10-CM | POA: Diagnosis not present

## 2024-07-01 LAB — BASIC METABOLIC PANEL WITH GFR
Anion gap: 10 (ref 5–15)
BUN: 40 mg/dL — ABNORMAL HIGH (ref 8–23)
CO2: 19 mmol/L — ABNORMAL LOW (ref 22–32)
Calcium: 8.7 mg/dL — ABNORMAL LOW (ref 8.9–10.3)
Chloride: 108 mmol/L (ref 98–111)
Creatinine, Ser: 1.38 mg/dL — ABNORMAL HIGH (ref 0.61–1.24)
GFR, Estimated: 49 mL/min — ABNORMAL LOW
Glucose, Bld: 167 mg/dL — ABNORMAL HIGH (ref 70–99)
Potassium: 4.6 mmol/L (ref 3.5–5.1)
Sodium: 137 mmol/L (ref 135–145)

## 2024-07-01 LAB — CBC
HCT: 35 % — ABNORMAL LOW (ref 39.0–52.0)
Hemoglobin: 11.3 g/dL — ABNORMAL LOW (ref 13.0–17.0)
MCH: 31.2 pg (ref 26.0–34.0)
MCHC: 32.3 g/dL (ref 30.0–36.0)
MCV: 96.7 fL (ref 80.0–100.0)
Platelets: 160 K/uL (ref 150–400)
RBC: 3.62 MIL/uL — ABNORMAL LOW (ref 4.22–5.81)
RDW: 14 % (ref 11.5–15.5)
WBC: 15.1 K/uL — ABNORMAL HIGH (ref 4.0–10.5)
nRBC: 0 % (ref 0.0–0.2)

## 2024-07-01 MED ORDER — HYDRALAZINE HCL 25 MG PO TABS
25.0000 mg | ORAL_TABLET | Freq: Three times a day (TID) | ORAL | Status: DC | PRN
  Administered 2024-07-01: 25 mg via ORAL
  Filled 2024-07-01: qty 1

## 2024-07-01 NOTE — ED Notes (Signed)
 Dr Pearlean at bedside

## 2024-07-01 NOTE — Progress Notes (Signed)
 PT Cancellation Note  Patient Details Name: Ruben Green MRN: [id number] DOB: 11-Sep-1933   Cancelled Treatment:    Reason Eval/Treat Not Completed: Medical issues which prohibited therapy (Chart reviewed, evaluation attempted. HR sustaining in 120s again with intermittent jumps into 130s. Will continue to follow and inititate evaluation once patient can safely perform exertion activities.)  10:10 AM, 07/01/2024 [name] Linear, PT, DPT Physical Therapist - [facility]  [phone] (ASCOM)    Christyna Letendre C 07/01/2024, 10:10 AM

## 2024-07-01 NOTE — Progress Notes (Addendum)
 "    Progress Note    Ruben Green  FMW:[id number] DOB: 09-Nov-1933  DOA: 06/30/2024 PCP: Sadie Manna, MD      Brief Narrative:    Medical records reviewed and are as summarized below:  Ruben Green is a 89 y.o. male with medical history significant for hypertension, chronic HFpEF, COPD, chronic hypoxic respiratory failure on home 3 L oxygen  nighttime at home, CKD stage IIIa, bladder cancer s/p BCG treatment, prostate cancer, AAA s/p repair, chronic right bundle branch block, who presented to the hospital with cough, wheezing, shortness of breath on low oxygen  saturation.  He developed a cough and wheezing with shortness of breath about 4 weeks ago.  He went to see his PCP and was diagnosed with pneumonia.  He has received 2 rounds of antibiotics thus far.  First antibiotic was azithromycin .  Patient and wife are not sure about the second antibiotic.  At night prior to admission, his wife noticed that his oxygen  saturation was in the 70s on 2 L oxygen .  EMS was called.  Oxygen  saturation was 85% on 4 L oxygen  when EMS checked it.     Vital signs in the ED: Temperature 97.8 F, respiratory rate 22, heart rate up to 141, BP 181/69, oxygen  saturation 94% on 4 L.  Initial lactic acid 2.3 which went up to 4.9 down to 3.0.  CTA chest IMPRESSION: 1. No evidence of pulmonary embolism. 2. Multifocal bilateral airspace consolidation, most severe in the left lower lobe, most consistent with multifocal pneumonia, with aspiration also in the differential diagnosis. Recommend follow-up chest imaging after treatment to document resolution. 3. Emphysema and diffuse bronchial wall thickening, which may contribute to hypoxic respiratory failure. 4. Enlarged subcarinal lymph node measuring 1.6 cm, favored reactive in the setting of pneumonia. Recommend follow-up chest CT in approximately 3 months to confirm improvement or resolution.   Assessment/Plan:   Principal Problem:    Acute respiratory failure with hypoxia (HCC) Active Problems:   Multifocal pneumonia   Pneumonia    Body mass index is 25.24 kg/m.   Severe sepsis secondary to multifocal pneumonia: Continue empiric IV ceftriaxone  and doxycycline .  Follow-up sputum and blood culture report. Check urine antigen for Legionella and pneumonia. Speech therapy evaluation is pending   Acute on chronic hypoxic respiratory failure: He is requiring 4 L oxygen  via [place].  He was previously treated with BiPAP in the ED. He uses 3 L oxygen  nightly at home.   Sinus tachycardia: This is likely from acute illness/infectious process   COPD exacerbation: Continue IV steroids and bronchodilators.   Mildly elevated but flat troponins: Troponin is 30, 43.  This is likely due to demand ischemia.   Chronic HFpEF: Compensated. proBNP 2326. 2D echo in May 2025 showed EF estimated at 60 to 65%, grade 1 diastolic dysfunction, mild MR   CKD stage IIIb: Creatinine is stable. Chart review shows shows GFR suggestive of CKD stage IIIb not stage IIIa.   General weakness: PT and OT evaluation.    Diet Order             Diet Heart Room service appropriate? Yes; Fluid consistency: Thin  Diet effective now                                  Consultants: None  Procedures: None    Medications:    aspirin  EC  81 mg Oral Daily   atorvastatin   40 mg Oral Daily   budesonide -glycopyrrolate -formoterol   2 puff Inhalation BID   enoxaparin  (LOVENOX ) injection  30 mg Subcutaneous Q24H   guaiFENesin   600 mg Oral BID   ipratropium  0.5 mg Nebulization Q6H   methylPREDNISolone  (SOLU-MEDROL ) injection  40 mg Intravenous Q12H   metoprolol  succinate  50 mg Oral Daily   tamsulosin   0.4 mg Oral Daily   Continuous Infusions:  cefTRIAXone  (ROCEPHIN )  IV Stopped (07/01/24 0534)   doxycycline  (VIBRAMYCIN ) IV Stopped (07/01/24 0741)     Anti-infectives (From admission, onward)    Start     Dose/Rate  Route Frequency Ordered Stop   07/01/24 0600  cefTRIAXone  (ROCEPHIN ) 2 g in sodium chloride  0.9 % 100 mL IVPB        2 g 200 mL/hr over 30 Minutes Intravenous Every 24 hours 06/30/24 0827 07/06/24 0559   07/01/24 0600  doxycycline  (VIBRAMYCIN ) 100 mg in sodium chloride  0.9 % 250 mL IVPB        100 mg 125 mL/hr over 120 Minutes Intravenous Every 12 hours 06/30/24 0856     06/30/24 0430  azithromycin  (ZITHROMAX ) 500 mg in sodium chloride  0.9 % 250 mL IVPB        500 mg 250 mL/hr over 60 Minutes Intravenous  Once 06/30/24 0342 06/30/24 0732   06/30/24 0345  cefTRIAXone  (ROCEPHIN ) 2 g in sodium chloride  0.9 % 100 mL IVPB        2 g 200 mL/hr over 30 Minutes Intravenous  Once 06/30/24 0342 06/30/24 0558              Family Communication/Anticipated D/C date and plan/Code Status   DVT prophylaxis: enoxaparin  (LOVENOX ) injection 30 mg Start: 06/30/24 2200     Code Status: Full Code  Family Communication: Plan discussed with his wife at the bedside Disposition Plan: Plan to discharge home   Status is: Inpatient Remains inpatient appropriate because: Severe sepsis from pneumonia       Subjective:   Interval events noted.  He complains of cough, chest pain and general weakness.  His wife was at the bedside.  Heather, RN, was also at the bedside.  Objective:    Vitals:   07/01/24 0800 07/01/24 0900 07/01/24 1030 07/01/24 1200  BP: (!) 155/94 (!) 161/80 (!) 141/81 138/74  Pulse: (!) 105 (!) 137 (!) 119 100  Resp: 18 (!) 22 (!) 24 17  Temp:      TempSrc:      SpO2: 90% 94% 93% 95%  Weight:      Height:       No data found.   Intake/Output Summary (Last 24 hours) at 07/01/2024 1241 Last data filed at 07/01/2024 0741 Gross per 24 hour  Intake 250 ml  Output --  Net 250 ml   Filed Weights   06/30/24 0243  Weight: 62.6 kg    Exam:  GEN: NAD SKIN: Warm and dry EYES: No pallor or icterus ENT: MMM CV: RRR, tachycardic PULM: Bibasilar rales, bilateral  rhonchi ABD: soft, ND, NT, +BS, midline abdominal hernia CNS: AAO x 3, non focal EXT: Bilateral pedal and ankle edema (right greater than left), no erythema or tenderness        Data Reviewed:   I have personally reviewed following labs and imaging studies:  Labs: Labs show the following:   Basic Metabolic Panel: Recent Labs  Lab 06/30/24 0247 06/30/24 0545 07/01/24 0429  NA 138  --  137  K 5.1  --  4.6  CL  108  --  108  CO2 20*  --  19*  GLUCOSE 138*  --  167*  BUN 52*  --  40*  CREATININE 1.70*  --  1.38*  CALCIUM  9.5  --  8.7*  MG 2.1 1.9  --   PHOS  --  2.5  --    GFR Estimated Creatinine Clearance: 28.6 mL/min (A) (by C-G formula based on SCr of 1.38 mg/dL (H)). Liver Function Tests: Recent Labs  Lab 06/30/24 0247  AST 22  ALT 16  ALKPHOS 87  BILITOT 0.5  PROT 7.0  ALBUMIN 4.3   No results for input(s): LIPASE, AMYLASE in the last 168 hours. No results for input(s): AMMONIA in the last 168 hours. Coagulation profile No results for input(s): INR, PROTIME in the last 168 hours.  CBC: Recent Labs  Lab 06/30/24 0247 07/01/24 0429  WBC 13.2* 15.1*  NEUTROABS 11.6*  --   HGB 13.6 11.3*  HCT 43.5 35.0*  MCV 99.8 96.7  PLT 188 160   Cardiac Enzymes: No results for input(s): CKTOTAL, CKMB, CKMBINDEX, TROPONINI in the last 168 hours. BNP (last 3 results) Recent Labs    06/30/24 0247  PROBNP 2,326.0*   CBG: No results for input(s): GLUCAP in the last 168 hours. D-Dimer: No results for input(s): DDIMER in the last 72 hours. Hgb A1c: No results for input(s): HGBA1C in the last 72 hours. Lipid Profile: No results for input(s): CHOL, HDL, LDLCALC, TRIG, CHOLHDL, LDLDIRECT in the last 72 hours. Thyroid  function studies: No results for input(s): TSH, T4TOTAL, T3FREE, THYROIDAB in the last 72 hours.  Invalid input(s): FREET3 Anemia work up: No results for input(s): VITAMINB12, FOLATE, FERRITIN,  TIBC, IRON , RETICCTPCT in the last 72 hours. Sepsis Labs: Recent Labs  Lab 06/30/24 0247 06/30/24 0535 06/30/24 1208 06/30/24 2010 07/01/24 0429  PROCALCITON  --  0.24  --   --   --   WBC 13.2*  --   --   --  15.1*  LATICACIDVEN  --  2.3* 4.9* 3.0*  --     Microbiology Recent Results (from the past 240 hours)  Resp panel by RT-PCR (RSV, Flu A&B, Covid) Anterior Nasal Swab     Status: None   Collection Time: 06/30/24  2:47 AM   Specimen: Anterior Nasal Swab  Result Value Ref Range Status   SARS Coronavirus 2 by RT PCR NEGATIVE NEGATIVE Final    Comment: (NOTE) SARS-CoV-2 target nucleic acids are NOT DETECTED.  The SARS-CoV-2 RNA is generally detectable in upper respiratory specimens during the acute phase of infection. The lowest concentration of SARS-CoV-2 viral copies this assay can detect is 138 copies/mL. A negative result does not preclude SARS-Cov-2 infection and should not be used as the sole basis for treatment or other patient management decisions. A negative result may occur with  improper specimen collection/handling, submission of specimen other than nasopharyngeal swab, presence of viral mutation(s) within the areas targeted by this assay, and inadequate number of viral copies(<138 copies/mL). A negative result must be combined with clinical observations, patient history, and epidemiological information. The expected result is Negative.  Fact Sheet for Patients:  bloggercourse.com  Fact Sheet for Healthcare Providers:  seriousbroker.it  This test is no t yet approved or cleared by the [place]  FDA and  has been authorized for detection and/or diagnosis of SARS-CoV-2 by FDA under an Emergency Use Authorization (EUA). This EUA will remain  in effect (meaning this test can be used) for the duration of the  COVID-19 declaration under Section 564(b)(1) of the Act, 21 U.S.C.section 360bbb-3(b)(1), unless  the authorization is terminated  or revoked sooner.       Influenza A by PCR NEGATIVE NEGATIVE Final   Influenza B by PCR NEGATIVE NEGATIVE Final    Comment: (NOTE) The Xpert Xpress SARS-CoV-2/FLU/RSV plus assay is intended as an aid in the diagnosis of influenza from Nasopharyngeal swab specimens and should not be used as a sole basis for treatment. Nasal washings and aspirates are unacceptable for Xpert Xpress SARS-CoV-2/FLU/RSV testing.  Fact Sheet for Patients: bloggercourse.com  Fact Sheet for Healthcare Providers: seriousbroker.it  This test is not yet approved or cleared by the [place]  FDA and has been authorized for detection and/or diagnosis of SARS-CoV-2 by FDA under an Emergency Use Authorization (EUA). This EUA will remain in effect (meaning this test can be used) for the duration of the COVID-19 declaration under Section 564(b)(1) of the Act, 21 U.S.C. section 360bbb-3(b)(1), unless the authorization is terminated or revoked.     Resp Syncytial Virus by PCR NEGATIVE NEGATIVE Final    Comment: (NOTE) Fact Sheet for Patients: bloggercourse.com  Fact Sheet for Healthcare Providers: seriousbroker.it  This test is not yet approved or cleared by the [place]  FDA and has been authorized for detection and/or diagnosis of SARS-CoV-2 by FDA under an Emergency Use Authorization (EUA). This EUA will remain in effect (meaning this test can be used) for the duration of the COVID-19 declaration under Section 564(b)(1) of the Act, 21 U.S.C. section 360bbb-3(b)(1), unless the authorization is terminated or revoked.  Performed at [facility], [address] [place], [place] [address]   Blood culture (routine x 2)     Status: None (Preliminary result)   Collection Time: 06/30/24  3:43 AM   Specimen: BLOOD  Result Value Ref Range Status   Specimen  Description BLOOD UNKNOWN  Final   Special Requests   Final    BOTTLES DRAWN AEROBIC AND ANAEROBIC Blood Culture results may not be optimal due to an inadequate volume of blood received in culture bottles   Culture   Final    NO GROWTH < 24 HOURS Performed at [facility], [address] [place], [place] [address]    Report Status PENDING  Incomplete  Blood culture (routine x 2)     Status: None (Preliminary result)   Collection Time: 06/30/24  3:48 AM   Specimen: BLOOD  Result Value Ref Range Status   Specimen Description BLOOD UNKNOWN  Final   Special Requests   Final    BOTTLES DRAWN AEROBIC AND ANAEROBIC Blood Culture results may not be optimal due to an inadequate volume of blood received in culture bottles   Culture   Final    NO GROWTH < 24 HOURS Performed at [facility], [address], [place], [place] [address]    Report Status PENDING  Incomplete  Expectorated Sputum Assessment w Gram Stain, Rflx to Resp Cult     Status: None   Collection Time: 06/30/24 12:08 PM   Specimen: Sputum  Result Value Ref Range Status   Specimen Description SPUTUM  Final   Special Requests NONE  Final   Sputum evaluation   Final    Sputum specimen not acceptable for testing.  Please recollect.   RESULT CALLED TO, READ BACK BY AND VERIFIED WITH: [facility] AT 1302 ON 06/30/24 BY SS Performed at [facility], [address] [place], [place] [address]    Report Status  06/30/2024 FINAL  Final    Procedures and diagnostic studies:  CT Angio Chest PE W and/or Wo Contrast Result Date: 06/30/2024 EXAM: CTA of the Chest with contrast for PE 06/30/2024 05:08:26 AM TECHNIQUE: CTA of the chest was performed without and with the administration of 65 mL of intravenous iohexol  (OMNIPAQUE ) 350 MG/ML injection. Multiplanar reformatted images are provided for review. MIP images are provided for review. Automated exposure control, iterative reconstruction, and/or weight  based adjustment of the mA/kV was utilized to reduce the radiation dose to as low as reasonably achievable. COMPARISON: 09/12/2024 CLINICAL HISTORY: eval PE, PNA. hypoxic failure. continued chest pain and tachycardia FINDINGS: PULMONARY ARTERIES: Pulmonary arteries are adequately opacified for evaluation. No pulmonary embolism. Main pulmonary artery is normal in caliber. MEDIASTINUM: Aortic atherosclerosis and multivessel coronary artery calcifications. Moderate-sized hiatal hernia. The heart and pericardium demonstrate no acute abnormality. LYMPH NODES: Subcarinal lymph node measures 1.6 cm, image 74/4. LUNGS AND PLEURA: Multifocal bilateral airspace consolidation is identified, including both lower lobes, lingula, and right middle lobe. This is most severe within the left lower lobe. Emphysema and diffuse bronchial wall thickening. No pleural effusion or pneumothorax. Differential diagnosis for the airspace consolidation includes pneumonia, acute bronchitis, or exacerbation of chronic obstructive pulmonary disease (COPD). Clinical correlation and follow-up imaging to assess resolution are recommended. UPPER ABDOMEN: A midline ventral abdominal wall hernia is noted, which contains nonobstructed loops of transverse colon. Several Bosniak Class I cysts are noted arising off the upper pole of the right kidney measuring up to 1.6 cm. No follow-up imaging recommended. SOFT TISSUES AND BONES: Unchanged appearance of chronic severe compression deformity involving the T7 vertebra. Mild superior endplate deformity involving the T8 vertebra is also unchanged. IMPRESSION: 1. No evidence of pulmonary embolism. 2. Multifocal bilateral airspace consolidation, most severe in the left lower lobe, most consistent with multifocal pneumonia, with aspiration also in the differential diagnosis. Recommend follow-up chest imaging after treatment to document resolution. 3. Emphysema and diffuse bronchial wall thickening, which may  contribute to hypoxic respiratory failure. 4. Enlarged subcarinal lymph node measuring 1.6 cm, favored reactive in the setting of pneumonia. Recommend follow-up chest CT in approximately 3 months to confirm improvement or resolution. Electronically signed by: Waddell Calk MD MD 06/30/2024 05:27 AM EST RP Workstation: [id number]   DG Chest Portable 1 View Result Date: 06/30/2024 EXAM: 1 VIEW(S) XRAY OF THE CHEST 06/30/2024 03:03:00 AM COMPARISON: 10/16/2023 CLINICAL HISTORY: SOB, CHF/COPD exacerbation FINDINGS: LUNGS AND PLEURA: Interstitial changes are noted in the right base, new from the prior exam. Left retrocardiac density consistent with focal infiltrate is noted. Small left pleural effusion. Background emphysema. No pneumothorax. HEART AND MEDIASTINUM: Aortic atherosclerosis. No acute abnormality of the cardiac silhouette. BONES AND SOFT TISSUES: No acute osseous abnormality. IMPRESSION: 1. Left retrocardiac opacity suspicious for focal infiltrate. 2. Small left pleural effusion. 3. Interstitial changes in the right base, new from the prior exam. 4. Background emphysema. Electronically signed by: Oneil Devonshire MD MD 06/30/2024 03:10 AM EST RP Workstation: [id number]               LOS: 1 day   Nathanyal Ashmead  [facility]   Pager on [email or web address]. If 7PM-7AM, please contact night-coverage at [email or web address]     07/01/2024, 12:41 PM           "

## 2024-07-01 NOTE — ED Notes (Signed)
 Spoke with Dr Cleatus related to patient complaints. Will review chart

## 2024-07-01 NOTE — ED Notes (Signed)
 Patient c/o increased sob, nausea and chest tightness. Attending paged to notify of patient complaints. EKG obtained and given to ED MD for eval

## 2024-07-01 NOTE — Progress Notes (Signed)
"  ° °      CROSS COVER NOTE  NAME: Ruben Green MRN: [id number] DOB : 1934/01/13    Concern as stated by nurse / staff   Chest pain     Pertinent findings on chart review: Admitted earlier with COPD exacerbation with acute on chronic hypoxic respiratory failure. Mildly elevated troponins Now having chest pain  Patient Assessment Patient assessed at bedside.  Wife at bedside - States pain has resolved.  Feels like his chest tightness every time he gets a breathing treatment      07/01/2024   10:30 PM 07/01/2024    9:30 PM 07/01/2024    8:00 PM  Vitals with BMI  Systolic 158 187 851  Diastolic 91 84 86  Pulse 107 98 83   Physical Exam Vitals and nursing note reviewed.  Constitutional:      General: He is not in acute distress. HENT:     Head: Normocephalic and atraumatic.  Cardiovascular:     Rate and Rhythm: Normal rate and regular rhythm.     Heart sounds: Normal heart sounds.  Pulmonary:     Effort: Pulmonary effort is normal.     Breath sounds: Normal breath sounds.  Abdominal:     Palpations: Abdomen is soft.     Tenderness: There is no abdominal tenderness.  Neurological:     Mental Status: Mental status is at baseline.        Workup  Troponin 43-->76      Assessment and  Interventions   Assessment:  Chest pain in the setting of COPD exacerbation, now resolved  Plan: Suspect noncardiac.  Troponin 43-76.  Consider trending to peak Can consider cardiology consult in the a.m. Continue to monitor for recurrence      CRITICAL CARE Performed by: Delayne LULLA Solian   Total critical care time: 60 minutes  Critical care time was exclusive of separately billable procedures and treating other patients.  Critical care was necessary to treat or prevent imminent or life-threatening deterioration.  Critical care was time spent personally by me on the following activities: development of treatment plan with patient and/or surrogate as well as nursing,  discussions with consultants, evaluation of patient's response to treatment, examination of patient, obtaining history from patient or surrogate, ordering and performing treatments and interventions, ordering and review of laboratory studies, ordering and review of radiographic studies, pulse oximetry and re-evaluation of patient's condition.  "

## 2024-07-01 NOTE — Evaluation (Signed)
 SLP Note  Patient Details Name: Ruben Green MRN: [id number] DOB: 1934-04-25   Order received and appreciated. Chart review completed. Per chart review, SLP evaluation recommended to rule out aspiration. Noted pt known to SLP services with clinical swallowing evaluations completed on 5/125 and 12/25/21 in which regular diet with thin liquids were recommended and pt without overt s/sx pharyngeal dysphagia. Given concern for silent aspiration in setting of multifocal PNA, MBSS tentatively scheduled for 1/15 @ 0800.  [name], M.S., CCC-SLP Speech-Language Pathologist [facility] [phone] (ASCOM)  [name] 07/01/2024, 2:02 PM

## 2024-07-02 ENCOUNTER — Inpatient Hospital Stay

## 2024-07-02 DIAGNOSIS — J9601 Acute respiratory failure with hypoxia: Secondary | ICD-10-CM | POA: Diagnosis not present

## 2024-07-02 LAB — COMPREHENSIVE METABOLIC PANEL WITH GFR
ALT: 12 U/L (ref 0–44)
AST: 16 U/L (ref 15–41)
Albumin: 3.2 g/dL — ABNORMAL LOW (ref 3.5–5.0)
Alkaline Phosphatase: 54 U/L (ref 38–126)
Anion gap: 12 (ref 5–15)
BUN: 40 mg/dL — ABNORMAL HIGH (ref 8–23)
CO2: 17 mmol/L — ABNORMAL LOW (ref 22–32)
Calcium: 8.5 mg/dL — ABNORMAL LOW (ref 8.9–10.3)
Chloride: 108 mmol/L (ref 98–111)
Creatinine, Ser: 1.35 mg/dL — ABNORMAL HIGH (ref 0.61–1.24)
GFR, Estimated: 50 mL/min — ABNORMAL LOW
Glucose, Bld: 171 mg/dL — ABNORMAL HIGH (ref 70–99)
Potassium: 4.5 mmol/L (ref 3.5–5.1)
Sodium: 137 mmol/L (ref 135–145)
Total Bilirubin: 0.3 mg/dL (ref 0.0–1.2)
Total Protein: 5.7 g/dL — ABNORMAL LOW (ref 6.5–8.1)

## 2024-07-02 LAB — TROPONIN T, HIGH SENSITIVITY
Troponin T High Sensitivity: 74 ng/L — ABNORMAL HIGH (ref 0–19)
Troponin T High Sensitivity: 76 ng/L — ABNORMAL HIGH (ref 0–19)

## 2024-07-02 LAB — STREP PNEUMONIAE URINARY ANTIGEN: Strep Pneumo Urinary Antigen: NEGATIVE

## 2024-07-02 LAB — LACTIC ACID, PLASMA: Lactic Acid, Venous: 1.6 mmol/L (ref 0.5–1.9)

## 2024-07-02 MED ORDER — IPRATROPIUM BROMIDE 0.02 % IN SOLN: 0.5000 mg | Freq: Four times a day (QID) | RESPIRATORY_TRACT | Status: DC | PRN

## 2024-07-02 MED ORDER — LEVALBUTEROL HCL 0.63 MG/3ML IN NEBU
0.6300 mg | INHALATION_SOLUTION | Freq: Four times a day (QID) | RESPIRATORY_TRACT | Status: DC | PRN
  Filled 2024-07-02: qty 3

## 2024-07-02 MED ORDER — PREDNISONE 20 MG PO TABS
40.0000 mg | ORAL_TABLET | Freq: Every day | ORAL | Status: AC
  Administered 2024-07-02 – 2024-07-04 (×3): 40 mg via ORAL
  Filled 2024-07-02 (×3): qty 2

## 2024-07-02 MED ORDER — GUAIFENESIN 100 MG/5ML PO LIQD
15.0000 mL | ORAL | Status: AC
  Administered 2024-07-02: 15 mL via ORAL
  Administered 2024-07-02: 10 mL via ORAL
  Administered 2024-07-02: 15 mL via ORAL
  Filled 2024-07-02 (×3): qty 20

## 2024-07-02 NOTE — Progress Notes (Signed)
 "    Progress Note    Ruben Green  FMW:[id number] DOB: 12-20-1933  DOA: 06/30/2024 PCP: Sadie Manna, MD      Brief Narrative:    Medical records reviewed and are as summarized below:  Ruben Green is a 89 y.o. male with medical history significant for hypertension, chronic HFpEF, COPD, chronic hypoxic respiratory failure on home 3 L oxygen  nighttime at home, CKD stage IIIa, bladder cancer s/p BCG treatment, prostate cancer, AAA s/p repair, chronic right bundle branch block, who presented to the hospital with cough, wheezing, shortness of breath on low oxygen  saturation.  He developed a cough and wheezing with shortness of breath about 4 weeks ago.  He went to see his PCP and was diagnosed with pneumonia.  He has received 2 rounds of antibiotics thus far.  First antibiotic was azithromycin .  Patient and wife are not sure about the second antibiotic.  At night prior to admission, his wife noticed that his oxygen  saturation was in the 70s on 2 L oxygen .  EMS was called.  Oxygen  saturation was 85% on 4 L oxygen  when EMS checked it.     Vital signs in the ED: Temperature 97.8 F, respiratory rate 22, heart rate up to 141, BP 181/69, oxygen  saturation 94% on 4 L.  Initial lactic acid 2.3 which went up to 4.9 down to 3.0.  CTA chest IMPRESSION: 1. No evidence of pulmonary embolism. 2. Multifocal bilateral airspace consolidation, most severe in the left lower lobe, most consistent with multifocal pneumonia, with aspiration also in the differential diagnosis. Recommend follow-up chest imaging after treatment to document resolution. 3. Emphysema and diffuse bronchial wall thickening, which may contribute to hypoxic respiratory failure. 4. Enlarged subcarinal lymph node measuring 1.6 cm, favored reactive in the setting of pneumonia. Recommend follow-up chest CT in approximately 3 months to confirm improvement or resolution.   Assessment/Plan:   Principal Problem:    Acute respiratory failure with hypoxia (HCC) Active Problems:   Multifocal pneumonia   Pneumonia    Body mass index is 25.24 kg/m.   Severe sepsis secondary to multifocal pneumonia: Continue IV ceftriaxone  and doxycycline . Strep pneumo urine antigen is negative.  Legionella urine antigen is pending.  Follow-up sputum and blood cultures. Speech therapist recommended regular diet.   Acute on chronic hypoxic respiratory failure: He is requiring 4 L oxygen  via [place].  He was previously treated with BiPAP in the ED. He uses 3 L oxygen  nightly at home.   Sinus tachycardia: Tachycardia has improved.    COPD exacerbation: Change IV Solu-Medrol  to prednisone .  Continue bronchodilators.   Mildly elevated but flat troponins: Troponin is 30, 43, 76, 74.  This is likely due to demand ischemia. Recent chest pain likely from pneumonia. 2D echo has been ordered for further evaluation   Chronic HFpEF: Compensated. proBNP 2326. 2D echo in May 2025 showed EF estimated at 60 to 65%, grade 1 diastolic dysfunction, mild MR   CKD stage IIIb: Creatinine is stable. Chart review shows shows GFR suggestive of CKD stage IIIb not stage IIIa.   General weakness: PT and OT evaluation.   Comorbidities include CAD s/p cardiac cath, AAA s/p repair in 2005, carotid artery disease s/p right CEA   Diet Order             Diet Heart Room service appropriate? Yes; Fluid consistency: Thin  Diet effective now  Consultants: None  Procedures: None    Medications:    aspirin  EC  81 mg Oral Daily   atorvastatin   40 mg Oral Daily   budesonide -glycopyrrolate -formoterol   2 puff Inhalation BID   enoxaparin  (LOVENOX ) injection  30 mg Subcutaneous Q24H   guaiFENesin   15 mL Oral Q4H   ipratropium  0.5 mg Nebulization Q6H   metoprolol  succinate  50 mg Oral Daily   predniSONE   40 mg Oral Q breakfast   tamsulosin   0.4 mg Oral Daily   Continuous  Infusions:  cefTRIAXone  (ROCEPHIN )  IV Stopped (07/02/24 9046)   doxycycline  (VIBRAMYCIN ) IV Stopped (07/01/24 2009)     Anti-infectives (From admission, onward)    Start     Dose/Rate Route Frequency Ordered Stop   07/01/24 0600  cefTRIAXone  (ROCEPHIN ) 2 g in sodium chloride  0.9 % 100 mL IVPB        2 g 200 mL/hr over 30 Minutes Intravenous Every 24 hours 06/30/24 0827 07/06/24 0559   07/01/24 0600  doxycycline  (VIBRAMYCIN ) 100 mg in sodium chloride  0.9 % 250 mL IVPB        100 mg 125 mL/hr over 120 Minutes Intravenous Every 12 hours 06/30/24 0856     06/30/24 0430  azithromycin  (ZITHROMAX ) 500 mg in sodium chloride  0.9 % 250 mL IVPB        500 mg 250 mL/hr over 60 Minutes Intravenous  Once 06/30/24 0342 06/30/24 0732   06/30/24 0345  cefTRIAXone  (ROCEPHIN ) 2 g in sodium chloride  0.9 % 100 mL IVPB        2 g 200 mL/hr over 30 Minutes Intravenous  Once 06/30/24 0342 06/30/24 0558              Family Communication/Anticipated D/C date and plan/Code Status   DVT prophylaxis: enoxaparin  (LOVENOX ) injection 30 mg Start: 06/30/24 2200     Code Status: Full Code  Family Communication: Plan discussed with his wife at the bedside Disposition Plan: Plan to discharge home   Status is: Inpatient Remains inpatient appropriate because: Severe sepsis from pneumonia       Subjective:   Interval events noted.  He feels better today.  He had chest pain and frequent cough last night.  He was given some cough syrup and he feels better today.  No chest pain but he still has some intermittent cough.  Breathing is a little better today.  Wife at the bedside.  Objective:    Vitals:   07/02/24 0530 07/02/24 0630 07/02/24 0800 07/02/24 0930  BP: (!) 148/79 (!) 172/93 (!) 190/94 (!) 151/97  Pulse: 92 93 92   Resp: 19 20 19    Temp:   (!) 89 F (31.7 C)   TempSrc:   Oral   SpO2: 96% 94% 94%   Weight:      Height:       No data found.   Intake/Output Summary (Last 24  hours) at 07/02/2024 1244 Last data filed at 07/01/2024 2009 Gross per 24 hour  Intake 250 ml  Output --  Net 250 ml   Filed Weights   06/30/24 0243  Weight: 62.6 kg    Exam:  GEN: NAD SKIN: Warm and dry EYES: No pallor or icterus ENT: MMM CV: RRR PULM: Bibasilar rales, no wheezing or rhonchi ABD: soft, ND, NT, +BS CNS: AAO x 3, non focal EXT: Bilateral pedal and ankle edema (right greater than left).  No tenderness or erythema       Data Reviewed:   I  have personally reviewed following labs and imaging studies:  Labs: Labs show the following:   Basic Metabolic Panel: Recent Labs  Lab 06/30/24 0247 06/30/24 0545 07/01/24 0429 07/02/24 0355  NA 138  --  137 137  K 5.1  --  4.6 4.5  CL 108  --  108 108  CO2 20*  --  19* 17*  GLUCOSE 138*  --  167* 171*  BUN 52*  --  40* 40*  CREATININE 1.70*  --  1.38* 1.35*  CALCIUM  9.5  --  8.7* 8.5*  MG 2.1 1.9  --   --   PHOS  --  2.5  --   --    GFR Estimated Creatinine Clearance: 29.2 mL/min (A) (by C-G formula based on SCr of 1.35 mg/dL (H)). Liver Function Tests: Recent Labs  Lab 06/30/24 0247 07/02/24 0355  AST 22 16  ALT 16 12  ALKPHOS 87 54  BILITOT 0.5 0.3  PROT 7.0 5.7*  ALBUMIN 4.3 3.2*   No results for input(s): LIPASE, AMYLASE in the last 168 hours. No results for input(s): AMMONIA in the last 168 hours. Coagulation profile No results for input(s): INR, PROTIME in the last 168 hours.  CBC: Recent Labs  Lab 06/30/24 0247 07/01/24 0429  WBC 13.2* 15.1*  NEUTROABS 11.6*  --   HGB 13.6 11.3*  HCT 43.5 35.0*  MCV 99.8 96.7  PLT 188 160   Cardiac Enzymes: No results for input(s): CKTOTAL, CKMB, CKMBINDEX, TROPONINI in the last 168 hours. BNP (last 3 results) Recent Labs    06/30/24 0247  PROBNP 2,326.0*   CBG: No results for input(s): GLUCAP in the last 168 hours. D-Dimer: No results for input(s): DDIMER in the last 72 hours. Hgb A1c: No results for input(s):  HGBA1C in the last 72 hours. Lipid Profile: No results for input(s): CHOL, HDL, LDLCALC, TRIG, CHOLHDL, LDLDIRECT in the last 72 hours. Thyroid  function studies: No results for input(s): TSH, T4TOTAL, T3FREE, THYROIDAB in the last 72 hours.  Invalid input(s): FREET3 Anemia work up: No results for input(s): VITAMINB12, FOLATE, FERRITIN, TIBC, IRON , RETICCTPCT in the last 72 hours. Sepsis Labs: Recent Labs  Lab 06/30/24 0247 06/30/24 0535 06/30/24 1208 06/30/24 2010 07/01/24 0429  PROCALCITON  --  0.24  --   --   --   WBC 13.2*  --   --   --  15.1*  LATICACIDVEN  --  2.3* 4.9* 3.0*  --     Microbiology Recent Results (from the past 240 hours)  Resp panel by RT-PCR (RSV, Flu A&B, Covid) Anterior Nasal Swab     Status: None   Collection Time: 06/30/24  2:47 AM   Specimen: Anterior Nasal Swab  Result Value Ref Range Status   SARS Coronavirus 2 by RT PCR NEGATIVE NEGATIVE Final    Comment: (NOTE) SARS-CoV-2 target nucleic acids are NOT DETECTED.  The SARS-CoV-2 RNA is generally detectable in upper respiratory specimens during the acute phase of infection. The lowest concentration of SARS-CoV-2 viral copies this assay can detect is 138 copies/mL. A negative result does not preclude SARS-Cov-2 infection and should not be used as the sole basis for treatment or other patient management decisions. A negative result may occur with  improper specimen collection/handling, submission of specimen other than nasopharyngeal swab, presence of viral mutation(s) within the areas targeted by this assay, and inadequate number of viral copies(<138 copies/mL). A negative result must be combined with clinical observations, patient history, and epidemiological information. The expected result  is Negative.  Fact Sheet for Patients:  bloggercourse.com  Fact Sheet for Healthcare Providers:   seriousbroker.it  This test is no t yet approved or cleared by the [place]  FDA and  has been authorized for detection and/or diagnosis of SARS-CoV-2 by FDA under an Emergency Use Authorization (EUA). This EUA will remain  in effect (meaning this test can be used) for the duration of the COVID-19 declaration under Section 564(b)(1) of the Act, 21 U.S.C.section 360bbb-3(b)(1), unless the authorization is terminated  or revoked sooner.       Influenza A by PCR NEGATIVE NEGATIVE Final   Influenza B by PCR NEGATIVE NEGATIVE Final    Comment: (NOTE) The Xpert Xpress SARS-CoV-2/FLU/RSV plus assay is intended as an aid in the diagnosis of influenza from Nasopharyngeal swab specimens and should not be used as a sole basis for treatment. Nasal washings and aspirates are unacceptable for Xpert Xpress SARS-CoV-2/FLU/RSV testing.  Fact Sheet for Patients: bloggercourse.com  Fact Sheet for Healthcare Providers: seriousbroker.it  This test is not yet approved or cleared by the [place]  FDA and has been authorized for detection and/or diagnosis of SARS-CoV-2 by FDA under an Emergency Use Authorization (EUA). This EUA will remain in effect (meaning this test can be used) for the duration of the COVID-19 declaration under Section 564(b)(1) of the Act, 21 U.S.C. section 360bbb-3(b)(1), unless the authorization is terminated or revoked.     Resp Syncytial Virus by PCR NEGATIVE NEGATIVE Final    Comment: (NOTE) Fact Sheet for Patients: bloggercourse.com  Fact Sheet for Healthcare Providers: seriousbroker.it  This test is not yet approved or cleared by the [place]  FDA and has been authorized for detection and/or diagnosis of SARS-CoV-2 by FDA under an Emergency Use Authorization (EUA). This EUA will remain in effect (meaning this test can be used) for  the duration of the COVID-19 declaration under Section 564(b)(1) of the Act, 21 U.S.C. section 360bbb-3(b)(1), unless the authorization is terminated or revoked.  Performed at [facility], [address] [place], [place] [address]   Blood culture (routine x 2)     Status: None (Preliminary result)   Collection Time: 06/30/24  3:43 AM   Specimen: BLOOD  Result Value Ref Range Status   Specimen Description BLOOD UNKNOWN  Final   Special Requests   Final    BOTTLES DRAWN AEROBIC AND ANAEROBIC Blood Culture results may not be optimal due to an inadequate volume of blood received in culture bottles   Culture   Final    NO GROWTH 2 DAYS Performed at [facility], [address] [place], [place] [address]    Report Status PENDING  Incomplete  Blood culture (routine x 2)     Status: None (Preliminary result)   Collection Time: 06/30/24  3:48 AM   Specimen: BLOOD  Result Value Ref Range Status   Specimen Description BLOOD UNKNOWN  Final   Special Requests   Final    BOTTLES DRAWN AEROBIC AND ANAEROBIC Blood Culture results may not be optimal due to an inadequate volume of blood received in culture bottles   Culture   Final    NO GROWTH 2 DAYS Performed at [facility], [address] [place], [place] [address]    Report Status PENDING  Incomplete  Expectorated Sputum Assessment w Gram Stain, Rflx to Resp Cult     Status: None   Collection Time: 06/30/24 12:08 PM   Specimen: Sputum  Result Value Ref Range Status  Specimen Description SPUTUM  Final   Special Requests NONE  Final   Sputum evaluation   Final    Sputum specimen not acceptable for testing.  Please recollect.   RESULT CALLED TO, READ BACK BY AND VERIFIED WITH: [facility] AT 1302 ON 06/30/24 BY SS Performed at [facility], [address], [place], [place] [address]    Report Status 06/30/2024 FINAL  Final    Procedures and diagnostic studies:  DG Swallowing  Func-Speech Pathology Result Date: 07/02/2024 Table formatting from the original result was not included. Modified Barium Swallow Study Patient Details Name: Ruben Green MRN: [id number] Date of Birth: [birth year] Today's Date: 07/02/2024 HPI/PMH: HPI: Per H&P,  Ruben Green is a 89 y.o. male with medical history significant of HTN, chronic HFpEF, COPD, chronic hypoxic respiratory failure on 3 L admits needed, CKD stage IIIa, bladder cancer status post BCG treatment, prostate cancer, AAA s/p repair, chronic RBBB, presented with worsening of cough wheezing shortness of breath and low oxygen . CTA Chest: Multifocal bilateral airspace consolidation, most severe in the left lower  lobe, most consistent with multifocal pneumonia, with aspiration also in the  differential diagnosis. Recommend follow-up chest imaging after treatment to  document resolution. Emphysema and diffuse bronchial wall thickening, which may contribute to  hypoxic respiratory failure. Clinical Impression: Clinical Impression: Pt presents with mild pharyngeal dysphagia, sensorimotor in nature. Single occurrence of frank aspiration noted secondary to bolus spilling into air following delayed swallow initiation/while pt was talking. Pt with delayed cough response with partial clearance. Aspiration did not reoccur despite challenging. Of note, given that this occurred on the first swallow of assessment and was not repeated with further trials, first swallow phenomenon can be accredited and particular trial deemed inadequate depiction of swallow function. Otherwise, only noted shallow penetration of thin liquid noted following swallow initiation at the pyriform sinus, penetration cleared with cued/independent throat clear/cough. Overall hyolaryngeal complex appears WFL, with only min reduced BOT/pharyngeal constriction leading to trace pharyngeal residue, cleared with cued/spontaneous swallow. Oral phase min prolonged, though grossly  functional.    Given baseline pulmonary/respiratory status and acute deconditioning, pt is at increased risk for aspiration/adverse events in the setting of aspiration. Recommend strict aspiration precautions, slow rate, small bites, elevated HOB, and alert for PO intake. Additionally, recommend intermittent throat clear and secondary swallow with liquid trials. Continue with regular solids and thin liquids. Education shared with pt/spouse regarding results of assessment and recommendations for diet/aspiration precautions. MD and RN aware of recommendations. SLP will follow up for further education for compensatory strategies and assessment of application. Factors that may increase risk of adverse event in presence of aspiration [name] 2021): Factors that may increase risk of adverse event in presence of aspiration [name] 2021): Respiratory or GI disease; Frail or deconditioned Recommendations/Plan: Swallowing Evaluation Recommendations Swallowing Evaluation Recommendations Recommendations: PO diet PO Diet Recommendation: Thin liquids (Level 0); Regular Liquid Administration via: Cup; Straw (monitor straw use) Medication Administration: Whole meds with puree Supervision: Patient able to self-feed; Full supervision/cueing for swallowing strategies Swallowing strategies  : Minimize environmental distractions; Slow rate; Small bites/sips; Clear throat intermittently; Multiple dry swallows after each bite/sip Postural changes: Stay upright 30-60 min after meals; Position pt fully upright for meals Oral care recommendations: Oral care BID (2x/day) Treatment Plan Treatment Plan Treatment recommendations: Therapy as outlined in treatment plan below Follow-up recommendations: Follow physicians's recommendations for discharge plan and follow up therapies Functional status assessment: Patient has had a recent decline  in their functional status and demonstrates the ability to make significant improvements  in function in a reasonable and predictable amount of time. Treatment frequency: Min 2x/week Treatment duration: 2 weeks Interventions: Aspiration precaution training; Compensatory techniques; Patient/family education; Diet toleration management by SLP Recommendations Recommendations for follow up therapy are one component of a multi-disciplinary discharge planning process, led by the attending physician.  Recommendations may be updated based on patient status, additional functional criteria and insurance authorization. Assessment: Orofacial Exam: Orofacial Exam Oral Cavity: Oral Hygiene: WFL Oral Cavity - Dentition: Adequate natural dentition Orofacial Anatomy: WFL Oral Motor/Sensory Function: WFL Anatomy: Anatomy: WFL Boluses Administered: Boluses Administered Boluses Administered: Thin liquids (Level 0); Mildly thick liquids (Level 2, nectar thick); Puree; Solid  Oral Impairment Domain: Oral Impairment Domain Lip Closure: No labial escape Tongue control during bolus hold: Not tested Bolus preparation/mastication: Timely and efficient chewing and mashing Bolus transport/lingual motion: Brisk tongue motion Oral residue: Trace residue lining oral structures Location of oral residue : -- (diffuse) Initiation of pharyngeal swallow : Valleculae; Posterior laryngeal surface of the epiglottis; Pyriform sinuses  Pharyngeal Impairment Domain: Pharyngeal Impairment Domain Soft palate elevation: No bolus between soft palate (SP)/pharyngeal wall (PW) Laryngeal elevation: Complete superior movement of thyroid  cartilage with complete approximation of arytenoids to epiglottic petiole Anterior hyoid excursion: Complete anterior movement Epiglottic movement: Complete inversion Laryngeal vestibule closure: Complete, no air/contrast in laryngeal vestibule Pharyngeal stripping wave : Present - diminished Pharyngeal contraction (A/P view only): N/A Pharyngoesophageal segment opening: Complete distension and complete duration, no  obstruction of flow Tongue base retraction: Trace column of contrast or air between tongue base and PPW Pharyngeal residue: Trace residue within or on pharyngeal structures Location of pharyngeal residue: Valleculae; Pyriform sinuses  Esophageal Impairment Domain: Esophageal Impairment Domain Esophageal clearance upright position: Complete clearance, esophageal coating Pill: Pill Consistency administered: -- (n/a) Penetration/Aspiration Scale Score: Penetration/Aspiration Scale Score 1.  Material does not enter airway: Puree; Solid; Mildly thick liquids (Level 2, nectar thick) 2.  Material enters airway, remains ABOVE vocal cords then ejected out: Thin liquids (Level 0) 7.  Material enters airway, passes BELOW cords and not ejected out despite cough attempt by patient: Thin liquids (Level 0) Compensatory Strategies: Compensatory Strategies Compensatory strategies: Yes Multiple swallows: Effective Effective Multiple Swallows: Thin liquid (Level 0)   General Information: Caregiver present: Yes (spouse)  Diet Prior to this Study: Regular; Thin liquids (Level 0)   Temperature : Normal   Respiratory Status: WFL   Supplemental O2: Nasal cannula   History of Recent Intubation: No  Behavior/Cognition: Alert; Cooperative Self-Feeding Abilities: Able to self-feed Baseline vocal quality/speech: Normal Volitional Cough: Able to elicit Volitional Swallow: Able to elicit No data recorded Goal Planning: Prognosis for improved oropharyngeal function: Fair Barriers to Reach Goals: Overall medical prognosis (respiratory compromise) No data recorded No data recorded Consulted and agree with results and recommendations: Patient; Family member/caregiver Pain: Pain Assessment Pain Assessment: No/denies pain End of Session: Start Time:SLP Start Time (ACUTE ONLY): 0815 Stop Time: SLP Stop Time (ACUTE ONLY): 0900 Time Calculation:SLP Time Calculation (min) (ACUTE ONLY): 45 min Charges: SLP Evaluations $ SLP Speech Visit: 1 Visit SLP  Evaluations $MBS Swallow: 1 Procedure SLP visit diagnosis: SLP Visit Diagnosis: Dysphagia, pharyngeal phase (R13.13) Past Medical History: Past Medical History: Diagnosis Date  AAA (abdominal aortic aneurysm)   a.) s/p repair in 2005  Anemia   Anginal pain   Anxiety   Arthritis   B12 deficiency   Bilateral cataracts   a.) s/p BILATERAL extractions in  2018  BPH with obstruction/lower urinary tract symptoms   CAD (coronary artery disease)   Carotid artery stenosis   a.) s/p CEA on the RIGHT  CHF (congestive heart failure) (HCC)   COPD (chronic obstructive pulmonary disease) (HCC)   Diastolic dysfunction 07/29/2019  a.)  TTE 07/29/2019: EF 50-55%; LA mildly enlarged; G1DD.  DOE (dyspnea on exertion)   Elevated PSA   Environmental and seasonal allergies   History of 2019 novel coronavirus disease (COVID-19)   History of kidney stones   HLD (hyperlipidemia)   HOH (hard of hearing)   HTN (hypertension)   Incomplete bladder emptying   Macular degeneration   Nausea vomiting and diarrhea 09/16/2023  Prostate cancer (HCC)   RBBB (right bundle branch block)   Valvular insufficiency   a.) TTE 07/29/2019: LVEF 50-55%; LA mild enlarged; triv AR/PR, mild MR, mod TR. Past Surgical History: Past Surgical History: Procedure Laterality Date  ABDOMINAL AORTIC ANEURYSM REPAIR    CATARACT EXTRACTION W/PHACO Right 09/25/2016  Procedure: CATARACT EXTRACTION PHACO AND INTRAOCULAR LENS PLACEMENT (IOC);  Surgeon: Elsie Carmine, MD;  Location: [facility] ORS;  Service: Ophthalmology;  Laterality: Right;  US  01:01 AP% 17.4 CDE 10.75 Fluid pack lot # [id number]  CATARACT EXTRACTION W/PHACO Left 10/16/2016  Procedure: CATARACT EXTRACTION PHACO AND INTRAOCULAR LENS PLACEMENT (IOC) Suture placed in Left eye;  Surgeon: Elsie Carmine, MD;  Location: [facility] OR[facility];  Service: Ophthalmology;  Laterality: Left;  US  2:06.8 AP% 22.2 CDE 28.17 Fluid pack lot # [id number]  COLONOSCOPY WITH PROPOFOL  N/A 03/31/2019  Procedure: COLONOSCOPY WITH PROPOFOL ;  Surgeon: Gaylyn Gladis PENNER, MD;  Location: [facility] ENDOSCOPY;  Service: Endoscopy;  Laterality: N/A;  COLONOSCOPY WITH PROPOFOL  N/A 09/09/2020  Procedure: COLONOSCOPY WITH PROPOFOL ;  Surgeon: Maryruth Ole DASEN, MD;  Location: [facility] ENDOSCOPY;  Service: Endoscopy;  Laterality: N/A;  CYSTOSCOPY W/ RETROGRADES Bilateral 02/23/2020  Procedure: CYSTOSCOPY WITH RETROGRADE PYELOGRAM;  Surgeon: Twylla Glendia BROCKS, MD;  Location: [facility] ORS;  Service: Urology;  Laterality: Bilateral;  CYSTOSCOPY WITH BIOPSY N/A 02/23/2020  Procedure: CYSTOSCOPY WITH BIOPSY;  Surgeon: Twylla Glendia BROCKS, MD;  Location: [facility] ORS;  Service: Urology;  Laterality: N/A;  EYE SURGERY    HERNIA REPAIR    x 2  INGUINAL HERNIA REPAIR Right 12/02/2015  Procedure: HERNIA REPAIR INGUINAL ADULT;  Surgeon: Larinda Unknown Sharps, MD;  Location: [facility] ORS;  Service: General;  Laterality: Right;  PROSTATE SURGERY  2012  Right Carotid Endarterectomy    TOTAL HIP ARTHROPLASTY Right 08/01/2021  Procedure: TOTAL HIP ARTHROPLASTY;  Surgeon: Edie Norleen PARAS, MD;  Location: [facility] ORS;  Service: Orthopedics;  Laterality: Right; Jordan Jarrett Clapp, MS, CCC-SLP Speech Language Pathologist Rehab Services; [facility] [phone] (ascom) Jordan J Clapp 07/02/2024, 9:38 AM              LOS: 2 days   Alynna Hargrove  [facility]   Pager on [email or web address]. If 7PM-7AM, please contact night-coverage at [email or web address]     07/02/2024, 12:44 PM           "

## 2024-07-02 NOTE — ED Notes (Addendum)
 Patient reports feeling better at this time. No further complaints of chest tightness of sob at this time. Attending updated

## 2024-07-02 NOTE — Evaluation (Signed)
 Physical Therapy Evaluation Patient Details Name: Ruben Green MRN: [id number] DOB: Dec 17, 1933 Today's Date: 07/02/2024  History of Present Illness  Ruben Green is a 89 y.o. male with medical history significant of HTN, chronic HFpEF, COPD, chronic hypoxic respiratory failure on 3 L admits needed, CKD stage IIIa, bladder cancer status post BCG treatment, prostate cancer, AAA s/p repair, chronic RBBB, presented with worsening of cough wheezing shortness of breath and low oxygen .  Clinical Impression  Patient noted to be in supine position at PT arrival in room, for an initial PT evaluation due to a decline in functional status, with baseline mobility reported as modI, and currently requiring minA to stand at bedside. The patient is A&O x 4, presenting with good willingness to work with PT. The patient resides in a house and lives with spouse with family/friend support. There are 2 STE inside the residence. Pt. able to stand for ~68min. to use urinal at bedside; as standing time increased pt HR elevated to 130/140s with irregular heart rthym; pt. afterwards led back to bed due to elevations in HR further mobility not safe at this time. Gait was held due to elevated HR. The overall clinical impression is that the patient presents with moderate mobility limitations. Recommended skilled PT will address safety, mobility, and discharge planning.        If plan is discharge home, recommend the following: A little help with walking and/or transfers;A little help with bathing/dressing/bathroom;Help with stairs or ramp for entrance;Assist for transportation   Can travel by private vehicle        Equipment Recommendations None recommended by PT  Recommendations for Other Services       Functional Status Assessment Patient has had a recent decline in their functional status and demonstrates the ability to make significant improvements in function in a reasonable and predictable amount of  time.     Precautions / Restrictions Restrictions Weight Bearing Restrictions Per Provider Order: No      Mobility  Bed Mobility Overal bed mobility: Needs Assistance Bed Mobility: Supine to Sit, Sit to Supine     Supine to sit: Contact guard Sit to supine: Min assist        Transfers Overall transfer level: Needs assistance Equipment used: Rolling walker (2 wheels) Transfers: Sit to/from Stand Sit to Stand: Contact guard assist           General transfer comment: able to stand for ~2min. to use urinal at bedside; as standing time increased pt HR elevated to 130/140s with irregular heart rthym; pt. afterwards led back to bed due to elevations in HR further mobility not safe at this time    Ambulation/Gait                  Stairs            Wheelchair Mobility     Tilt Bed    Modified Rankin (Stroke Patients Only)       Balance Overall balance assessment: Needs assistance   Sitting balance-Leahy Scale: Good     Standing balance support: During functional activity, Reliant on assistive device for balance Standing balance-Leahy Scale: Fair                               Pertinent Vitals/Pain Pain Assessment Pain Assessment: No/denies pain    Home Living Family/patient expects to be discharged to:: Private residence Living Arrangements: Spouse/significant other Available Help at  Discharge: Family;Available 24 hours/day Type of Home: House Home Access: Stairs to enter Entrance Stairs-Rails: Right Entrance Stairs-Number of Steps: 2   Home Layout: One level Home Equipment: Shower seat - built [employer] (2 wheels) Additional Comments: wears O2 only at night    Prior Function Prior Level of Function : Independent/Modified Independent;Driving;History of Falls (last six months)             Mobility Comments: no AD use. IND ADLs Comments: IND     Extremity/Trunk Assessment   Upper Extremity Assessment Upper  Extremity Assessment: Generalized weakness    Lower Extremity Assessment Lower Extremity Assessment: Generalized weakness    Cervical / Trunk Assessment Cervical / Trunk Assessment: Normal  Communication   Communication Communication: No apparent difficulties Factors Affecting Communication: Hearing impaired    Cognition Arousal: Alert Behavior During Therapy: WFL for tasks assessed/performed   PT - Cognitive impairments: No apparent impairments                         Following commands: Impaired Following commands impaired: Follows one step commands inconsistently     Cueing Cueing Techniques: Verbal cues     General Comments      Exercises     Assessment/Plan    PT Assessment Patient needs continued PT services  PT Problem List Decreased strength;Decreased activity tolerance;Decreased balance;Decreased mobility;Decreased safety awareness       PT Treatment Interventions DME instruction;Stair training;Functional mobility training;Balance training;Therapeutic exercise;Therapeutic activities;Neuromuscular re-education;Patient/family education    PT Goals (Current goals can be found in the Care Plan section)  Acute Rehab PT Goals Patient Stated Goal: Pt wants to go home PT Goal Formulation: With patient Time For Goal Achievement: 08/06/24 Potential to Achieve Goals: Good    Frequency Min 2X/week     Co-evaluation               AM-PAC PT 6 Clicks Mobility  Outcome Measure Help needed turning from your back to your side while in a flat bed without using bedrails?: None Help needed moving from lying on your back to sitting on the side of a flat bed without using bedrails?: None Help needed moving to and from a bed to a chair (including a wheelchair)?: None Help needed standing up from a chair using your arms (e.g., wheelchair or bedside chair)?: None Help needed to walk in hospital room?: A Lot Help needed climbing 3-5 steps with a railing?  : A Lot 6 Click Score: 20    End of Session   Activity Tolerance: Patient tolerated treatment well Patient left: in bed Nurse Communication: Mobility status PT Visit Diagnosis: Other abnormalities of gait and mobility (R26.89);Difficulty in walking, not elsewhere classified (R26.2);Muscle weakness (generalized) (M62.81)    Time: 9144-9082 PT Time Calculation (min) (ACUTE ONLY): 22 min   Charges:   PT Evaluation $PT Eval Low Complexity: 1 Low   PT General Charges $$ ACUTE PT VISIT: 1 Visit         Sherlean Lesches DPT, PT    Sherlean A Kalise Fickett 07/02/2024, 9:55 AM

## 2024-07-02 NOTE — Progress Notes (Signed)
 Modified Barium Swallow Study  Patient Details  Name: Ruben Green MRN: [id number] Date of Birth: 1934/06/11  Today's Date: 07/02/2024  Modified Barium Swallow completed.  Full report located under Chart Review in the Imaging Section.  History of Present Illness Per H&P,  Ruben Green is a 89 y.o. male with medical history significant of HTN, chronic HFpEF, COPD, chronic hypoxic respiratory failure on 3 L admits needed, CKD stage IIIa, bladder cancer status post BCG treatment, prostate cancer, AAA s/p repair, chronic RBBB, presented with worsening of cough wheezing shortness of breath and low oxygen . CTA Chest: Multifocal bilateral airspace consolidation, most severe in the left lower  lobe, most consistent with multifocal pneumonia, with aspiration also in the  differential diagnosis. Recommend follow-up chest imaging after treatment to  document resolution. Emphysema and diffuse bronchial wall thickening, which may contribute to  hypoxic respiratory failure.   Clinical Impression Pt presents with mild pharyngeal dysphagia, sensorimotor in nature. Single occurrence of frank aspiration noted secondary to bolus spilling into air following delayed swallow initiation/while pt was talking. Pt with delayed cough response with partial clearance. Aspiration did not reoccur despite challenging. Of note, given that this occurred on the first swallow of assessment and was not repeated with further trials, first swallow phenomenon can be accredited and particular trial deemed inadequate depiction of swallow function. Otherwise, only noted shallow penetration of thin liquid noted following swallow initiation at the pyriform sinus, penetration cleared with cued/independent throat clear/cough. Overall hyolaryngeal complex appears WFL, with only min reduced BOT/pharyngeal constriction leading to trace pharyngeal residue, cleared with cued/spontaneous swallow. Oral phase min prolonged, though grossly  functional.  Given baseline pulmonary/respiratory status and acute deconditioning, pt is at increased risk for aspiration/adverse events in the setting of aspiration. Recommend strict aspiration precautions, slow rate, small bites, elevated HOB, and alert for PO intake. Additionally, recommend intermittent throat clear and secondary swallow with liquid trials. Continue with regular solids and thin liquids. Education shared with pt/spouse regarding results of assessment and recommendations for diet/aspiration precautions. MD and RN aware of recommendations. SLP will follow up for further education for compensatory strategies and assessment of application.     Factors that may increase risk of adverse event in presence of aspiration Ruben Green 2021): Respiratory or GI disease;Frail or deconditioned  Swallow Evaluation Recommendations Recommendations: PO diet PO Diet Recommendation: Thin liquids (Level 0);Regular Liquid Administration via: Cup;Straw (monitor straw use) Medication Administration: Whole meds with puree Supervision: Patient able to self-feed;Full supervision/cueing for swallowing strategies Swallowing strategies  : Minimize environmental distractions;Slow rate;Small bites/sips;Clear throat intermittently;Multiple dry swallows after each bite/sip Postural changes: Stay upright 30-60 min after meals;Position pt fully upright for meals Oral care recommendations: Oral care BID (2x/day)    Ruben Jakubek Clapp, MS, CCC-SLP Speech Language Pathologist Rehab Services; [facility] [place] [phone] (ascom)    Ruben Green 07/02/2024,9:12 AM

## 2024-07-02 NOTE — Plan of Care (Signed)

## 2024-07-03 ENCOUNTER — Inpatient Hospital Stay
Admit: 2024-07-03 | Discharge: 2024-07-03 | Disposition: A | Discharge status: Home / Self Care | Attending: Internal Medicine | Admitting: Internal Medicine

## 2024-07-03 DIAGNOSIS — J9601 Acute respiratory failure with hypoxia: Secondary | ICD-10-CM | POA: Diagnosis not present

## 2024-07-03 LAB — CBC
HCT: 35.2 % — ABNORMAL LOW (ref 39.0–52.0)
Hemoglobin: 11.3 g/dL — ABNORMAL LOW (ref 13.0–17.0)
MCH: 30.9 pg (ref 26.0–34.0)
MCHC: 32.1 g/dL (ref 30.0–36.0)
MCV: 96.2 fL (ref 80.0–100.0)
Platelets: 180 K/uL (ref 150–400)
RBC: 3.66 MIL/uL — ABNORMAL LOW (ref 4.22–5.81)
RDW: 13.6 % (ref 11.5–15.5)
WBC: 12.4 K/uL — ABNORMAL HIGH (ref 4.0–10.5)
nRBC: 0 % (ref 0.0–0.2)

## 2024-07-03 LAB — ECHOCARDIOGRAM COMPLETE
Height: 62 in
S' Lateral: 3.1 cm
Weight: 2208 [oz_av]

## 2024-07-03 LAB — LEGIONELLA PNEUMOPHILA SEROGP 1 UR AG: L. pneumophila Serogp 1 Ur Ag: NEGATIVE

## 2024-07-03 LAB — BASIC METABOLIC PANEL WITH GFR
Anion gap: 10 (ref 5–15)
BUN: 41 mg/dL — ABNORMAL HIGH (ref 8–23)
CO2: 19 mmol/L — ABNORMAL LOW (ref 22–32)
Calcium: 8.7 mg/dL — ABNORMAL LOW (ref 8.9–10.3)
Chloride: 108 mmol/L (ref 98–111)
Creatinine, Ser: 1.42 mg/dL — ABNORMAL HIGH (ref 0.61–1.24)
GFR, Estimated: 48 mL/min — ABNORMAL LOW
Glucose, Bld: 134 mg/dL — ABNORMAL HIGH (ref 70–99)
Potassium: 4.5 mmol/L (ref 3.5–5.1)
Sodium: 137 mmol/L (ref 135–145)

## 2024-07-03 MED ORDER — TORSEMIDE 20 MG PO TABS
10.0000 mg | ORAL_TABLET | Freq: Every day | ORAL | Status: DC
  Filled 2024-07-03: qty 1

## 2024-07-03 MED ORDER — DOXYCYCLINE HYCLATE 100 MG PO TABS
100.0000 mg | ORAL_TABLET | Freq: Two times a day (BID) | ORAL | Status: DC
  Administered 2024-07-03 – 2024-07-06 (×6): 100 mg via ORAL
  Filled 2024-07-03 (×6): qty 1

## 2024-07-03 MED ORDER — TORSEMIDE 20 MG PO TABS
20.0000 mg | ORAL_TABLET | Freq: Every day | ORAL | Status: DC
  Administered 2024-07-03 – 2024-07-06 (×4): 20 mg via ORAL
  Filled 2024-07-03 (×3): qty 1

## 2024-07-03 NOTE — Progress Notes (Addendum)
 PT Cancellation Note  Patient Details Name: Ruben Green MRN: [id number] DOB: [birth year]   Cancelled Treatment:   PT attempt, pt was ambulating with mobility tech ~ 1/2 way around the unit. Author will return later this afternoon to progress OOB activity as able.   2nd attempt 1337. Pt recently back to bed and requested to rest at this time. Did well ambulating with MT earlier. Acute PT will continue to follow per current POC.   Rankin KATHEE Essex 07/03/2024, 10:37 AM

## 2024-07-03 NOTE — Progress Notes (Signed)
 Mobility Specialist - Progress Note  Post-mobility: HR-72,  SPO2-90%   07/03/24 1100  Mobility  Activity Ambulated with assistance;Stood with assistance;Dangled on edge of bed;Respositioned in chair  Level of Assistance Contact guard assist, steadying assist  Assistive Device None  Distance Ambulated (ft) 65 ft  Range of Motion/Exercises Active  Activity Response Tolerated fair  Mobility visit 1 Mobility  Mobility Specialist Start Time (ACUTE ONLY) 1014  Mobility Specialist Stop Time (ACUTE ONLY) 1036  Mobility Specialist Time Calculation (min) (ACUTE ONLY) 22 min   Pt was supine in bed with the HOB elevated on O2 @ 4 L. Pt was able to get to the EOB independently with bed features. Pt is able today to STS with minA. Pt ambulated well. After activity pt returned to the room with needs in reach and repositioned in the recliner with guest in the room.  Clem Rodes Mobility Specialist 07/03/24, 11:33 AM

## 2024-07-03 NOTE — Progress Notes (Signed)
*  PRELIMINARY RESULTS* Echocardiogram 2D Echocardiogram has been performed.  Floydene Harder 07/03/2024, 7:49 AM

## 2024-07-03 NOTE — Progress Notes (Signed)
 Speech Language Pathology Treatment: Dysphagia  Patient Details Name: Ruben Green MRN: [id number] DOB: [birth year] Today's Date: 07/03/2024 Time: 1152-1201 SLP Time Calculation (min) (ACUTE ONLY): 9 min  Assessment / Plan / Recommendation Clinical Impression  Pt seen for ongoing diet toleration and education following pt's Modified Barium Swallow Study. Skilled observation was provided on pt consuming regular texture snack with thin liquids via straw. Pt with no overt s/s of dysphagia or aspiration. Pt and his wife confirm good ability when consuming regular breakfast this morning. At this time, no further skilled ST services are indicated.    HPI HPI: Per H&P,  Ruben Green is a 89 y.o. male with medical history significant of HTN, chronic HFpEF, COPD, chronic hypoxic respiratory failure on 3 L admits needed, CKD stage IIIa, bladder cancer status post BCG treatment, prostate cancer, AAA s/p repair, chronic RBBB, presented with worsening of cough wheezing shortness of breath and low oxygen . CTA Chest: Multifocal bilateral airspace consolidation, most severe in the left lower  lobe, most consistent with multifocal pneumonia, with aspiration also in the  differential diagnosis. Recommend follow-up chest imaging after treatment to  document resolution. Emphysema and diffuse bronchial wall thickening, which may contribute to  hypoxic respiratory failure.      SLP Plan  All goals met        Swallow Evaluation Recommendations   Recommendations: PO diet PO Diet Recommendation: Regular;Thin liquids (Level 0) Liquid Administration via: Straw;Cup Medication Administration: Whole meds with liquid Supervision: Patient able to self-feed;Intermittent supervision/cueing for swallowing strategies Postural changes: Position pt fully upright for meals;Stay upright 30-60 min after meals Oral care recommendations: Oral care BID (2x/day)     Recommendations                      Oral care BID   Intermittent Supervision/Assistance Dysphagia, pharyngeal phase (R13.13)     All goals met    [name], M.S., CCC-SLP, Tree Surgeon Certified Brain Injury Specialist [facility]  [facility] Rehabilitation Services Office [phone] Ascom [phone] Fax [phone]

## 2024-07-03 NOTE — Progress Notes (Signed)
 "    Progress Note    Ruben Green  FMW:[id number] DOB: 01-18-1934  DOA: 06/30/2024 PCP: Sadie Manna, MD      Brief Narrative:    Medical records reviewed and are as summarized below:  Ruben Green is a 89 y.o. male with medical history significant for hypertension, chronic HFpEF, COPD, chronic hypoxic respiratory failure on home 3 L oxygen  nighttime at home, CKD stage IIIa, bladder cancer s/p BCG treatment, prostate cancer, AAA s/p repair, chronic right bundle branch block, who presented to the hospital with cough, wheezing, shortness of breath on low oxygen  saturation.  He developed a cough and wheezing with shortness of breath about 4 weeks ago.  He went to see his PCP and was diagnosed with pneumonia.  He has received 2 rounds of antibiotics thus far.  First antibiotic was azithromycin .  Patient and wife are not sure about the second antibiotic.  At night prior to admission, his wife noticed that his oxygen  saturation was in the 70s on 2 L oxygen .  EMS was called.  Oxygen  saturation was 85% on 4 L oxygen  when EMS checked it.     Vital signs in the ED: Temperature 97.8 F, respiratory rate 22, heart rate up to 141, BP 181/69, oxygen  saturation 94% on 4 L.  Initial lactic acid 2.3 which went up to 4.9 down to 3.0.  CTA chest IMPRESSION: 1. No evidence of pulmonary embolism. 2. Multifocal bilateral airspace consolidation, most severe in the left lower lobe, most consistent with multifocal pneumonia, with aspiration also in the differential diagnosis. Recommend follow-up chest imaging after treatment to document resolution. 3. Emphysema and diffuse bronchial wall thickening, which may contribute to hypoxic respiratory failure. 4. Enlarged subcarinal lymph node measuring 1.6 cm, favored reactive in the setting of pneumonia. Recommend follow-up chest CT in approximately 3 months to confirm improvement or resolution.   Assessment/Plan:   Principal Problem:    Acute respiratory failure with hypoxia (HCC) Active Problems:   Multifocal pneumonia   Pneumonia    Body mass index is 25.24 kg/m.   Severe sepsis secondary to multifocal pneumonia: Continue antibiotics (IV ceftriaxone  and doxycycline ).   Strep pneumo urine antigen is negative.  Legionella urine antigen is pending.  No growth on blood cultures thus far. Speech therapist recommended regular diet.   Acute on chronic hypoxic respiratory failure: He is requiring 4 L oxygen  via [place].  He was previously treated with BiPAP in the ED. He uses 3 L oxygen  nightly at home.   Sinus tachycardia: Tachycardia has improved.    COPD exacerbation: Continue prednisone  and bronchodilators.    Mildly elevated but flat troponins: Troponin is 30, 43, 76, 74.  This is likely due to demand ischemia. Recent chest pain likely from pneumonia. Repeat 2D echo is pending.   Chronic HFpEF: Compensated. Resume home torsemide  (dose had been increased from 10 mg to 20 mg daily by nephrologist on 04/16/2024) proBNP 2326. 2D echo in May 2025 showed EF estimated at 60 to 65%, grade 1 diastolic dysfunction, mild MR   CKD stage IIIb: Creatinine is stable. Chart review shows shows GFR suggestive of CKD stage IIIb not stage IIIa.   General weakness: He has been evaluated by PT.  Possible discharge to home   Comorbidities include CAD s/p cardiac cath, AAA s/p repair in 2005, carotid artery disease s/p right CEA   Diet Order             Diet Heart Room service appropriate? Yes; Fluid  consistency: Thin  Diet effective now                                  Consultants: None  Procedures: None    Medications:    aspirin  EC  81 mg Oral Daily   atorvastatin   40 mg Oral Daily   budesonide -glycopyrrolate -formoterol   2 puff Inhalation BID   enoxaparin  (LOVENOX ) injection  30 mg Subcutaneous Q24H   metoprolol  succinate  50 mg Oral Daily   predniSONE   40 mg Oral Q breakfast    tamsulosin   0.4 mg Oral Daily   torsemide   20 mg Oral Daily   Continuous Infusions:  cefTRIAXone  (ROCEPHIN )  IV 2 g (07/03/24 0520)   doxycycline  (VIBRAMYCIN ) IV 100 mg (07/03/24 0605)     Anti-infectives (From admission, onward)    Start     Dose/Rate Route Frequency Ordered Stop   07/01/24 0600  cefTRIAXone  (ROCEPHIN ) 2 g in sodium chloride  0.9 % 100 mL IVPB        2 g 200 mL/hr over 30 Minutes Intravenous Every 24 hours 06/30/24 0827 07/06/24 0559   07/01/24 0600  doxycycline  (VIBRAMYCIN ) 100 mg in sodium chloride  0.9 % 250 mL IVPB        100 mg 125 mL/hr over 120 Minutes Intravenous Every 12 hours 06/30/24 0856     06/30/24 0430  azithromycin  (ZITHROMAX ) 500 mg in sodium chloride  0.9 % 250 mL IVPB        500 mg 250 mL/hr over 60 Minutes Intravenous  Once 06/30/24 0342 06/30/24 0732   06/30/24 0345  cefTRIAXone  (ROCEPHIN ) 2 g in sodium chloride  0.9 % 100 mL IVPB        2 g 200 mL/hr over 30 Minutes Intravenous  Once 06/30/24 0342 06/30/24 0558              Family Communication/Anticipated D/C date and plan/Code Status   DVT prophylaxis: enoxaparin  (LOVENOX ) injection 30 mg Start: 06/30/24 2200     Code Status: Full Code  Family Communication: Plan discussed with his wife at the bedside Disposition Plan: Plan to discharge home   Status is: Inpatient Remains inpatient appropriate because: Severe sepsis from pneumonia       Subjective:   Interval events noted.  No complaints.  No chest pain or shortness of breath.  Cough is slowly improving.  He feels weak.  Wife at the bedside.  Objective:    Vitals:   07/03/24 0600 07/03/24 0808 07/03/24 1011 07/03/24 1200  BP:  (!) 179/82 (!) 127/93 137/80  Pulse:  78 (!) 111 79  Resp: 16 20  18   Temp:  97.8 F (36.6 C)  (!) 97.5 F (36.4 C)  TempSrc:  Oral    SpO2:  96%  95%  Weight:      Height:       No data found.   Intake/Output Summary (Last 24 hours) at 07/03/2024 1352 Last data filed at 07/02/2024  1912 Gross per 24 hour  Intake 120 ml  Output --  Net 120 ml   Filed Weights   06/30/24 0243  Weight: 62.6 kg    Exam:  GEN: NAD SKIN: Warm and dry EYES: No pallor or icterus ENT: MMM CV: RRR PULM: Bibasilar rales ABD: soft, ND, NT, +BS CNS: AAO x 3, non focal EXT: Bilateral ankle and pedal edema      Data Reviewed:   I have personally reviewed following  labs and imaging studies:  Labs: Labs show the following:   Basic Metabolic Panel: Recent Labs  Lab 06/30/24 0247 06/30/24 0545 07/01/24 0429 07/02/24 0355 07/03/24 0441  NA 138  --  137 137 137  K 5.1  --  4.6 4.5 4.5  CL 108  --  108 108 108  CO2 20*  --  19* 17* 19*  GLUCOSE 138*  --  167* 171* 134*  BUN 52*  --  40* 40* 41*  CREATININE 1.70*  --  1.38* 1.35* 1.42*  CALCIUM  9.5  --  8.7* 8.5* 8.7*  MG 2.1 1.9  --   --   --   PHOS  --  2.5  --   --   --    GFR Estimated Creatinine Clearance: 27.8 mL/min (A) (by C-G formula based on SCr of 1.42 mg/dL (H)). Liver Function Tests: Recent Labs  Lab 06/30/24 0247 07/02/24 0355  AST 22 16  ALT 16 12  ALKPHOS 87 54  BILITOT 0.5 0.3  PROT 7.0 5.7*  ALBUMIN 4.3 3.2*   No results for input(s): LIPASE, AMYLASE in the last 168 hours. No results for input(s): AMMONIA in the last 168 hours. Coagulation profile No results for input(s): INR, PROTIME in the last 168 hours.  CBC: Recent Labs  Lab 06/30/24 0247 07/01/24 0429 07/03/24 0441  WBC 13.2* 15.1* 12.4*  NEUTROABS 11.6*  --   --   HGB 13.6 11.3* 11.3*  HCT 43.5 35.0* 35.2*  MCV 99.8 96.7 96.2  PLT 188 160 180   Cardiac Enzymes: No results for input(s): CKTOTAL, CKMB, CKMBINDEX, TROPONINI in the last 168 hours. BNP (last 3 results) Recent Labs    06/30/24 0247  PROBNP 2,326.0*   CBG: No results for input(s): GLUCAP in the last 168 hours. D-Dimer: No results for input(s): DDIMER in the last 72 hours. Hgb A1c: No results for input(s): HGBA1C in the last 72  hours. Lipid Profile: No results for input(s): CHOL, HDL, LDLCALC, TRIG, CHOLHDL, LDLDIRECT in the last 72 hours. Thyroid  function studies: No results for input(s): TSH, T4TOTAL, T3FREE, THYROIDAB in the last 72 hours.  Invalid input(s): FREET3 Anemia work up: No results for input(s): VITAMINB12, FOLATE, FERRITIN, TIBC, IRON , RETICCTPCT in the last 72 hours. Sepsis Labs: Recent Labs  Lab 06/30/24 0247 06/30/24 0535 06/30/24 1208 06/30/24 2010 07/01/24 0429 07/02/24 1320 07/03/24 0441  PROCALCITON  --  0.24  --   --   --   --   --   WBC 13.2*  --   --   --  15.1*  --  12.4*  LATICACIDVEN  --  2.3* 4.9* 3.0*  --  1.6  --     Microbiology Recent Results (from the past 240 hours)  Resp panel by RT-PCR (RSV, Flu A&B, Covid) Anterior Nasal Swab     Status: None   Collection Time: 06/30/24  2:47 AM   Specimen: Anterior Nasal Swab  Result Value Ref Range Status   SARS Coronavirus 2 by RT PCR NEGATIVE NEGATIVE Final    Comment: (NOTE) SARS-CoV-2 target nucleic acids are NOT DETECTED.  The SARS-CoV-2 RNA is generally detectable in upper respiratory specimens during the acute phase of infection. The lowest concentration of SARS-CoV-2 viral copies this assay can detect is 138 copies/mL. A negative result does not preclude SARS-Cov-2 infection and should not be used as the sole basis for treatment or other patient management decisions. A negative result may occur with  improper specimen collection/handling, submission of  specimen other than nasopharyngeal swab, presence of viral mutation(s) within the areas targeted by this assay, and inadequate number of viral copies(<138 copies/mL). A negative result must be combined with clinical observations, patient history, and epidemiological information. The expected result is Negative.  Fact Sheet for Patients:  bloggercourse.com  Fact Sheet for Healthcare Providers:   seriousbroker.it  This test is no t yet approved or cleared by the [place]  FDA and  has been authorized for detection and/or diagnosis of SARS-CoV-2 by FDA under an Emergency Use Authorization (EUA). This EUA will remain  in effect (meaning this test can be used) for the duration of the COVID-19 declaration under Section 564(b)(1) of the Act, 21 U.S.C.section 360bbb-3(b)(1), unless the authorization is terminated  or revoked sooner.       Influenza A by PCR NEGATIVE NEGATIVE Final   Influenza B by PCR NEGATIVE NEGATIVE Final    Comment: (NOTE) The Xpert Xpress SARS-CoV-2/FLU/RSV plus assay is intended as an aid in the diagnosis of influenza from Nasopharyngeal swab specimens and should not be used as a sole basis for treatment. Nasal washings and aspirates are unacceptable for Xpert Xpress SARS-CoV-2/FLU/RSV testing.  Fact Sheet for Patients: bloggercourse.com  Fact Sheet for Healthcare Providers: seriousbroker.it  This test is not yet approved or cleared by the [place]  FDA and has been authorized for detection and/or diagnosis of SARS-CoV-2 by FDA under an Emergency Use Authorization (EUA). This EUA will remain in effect (meaning this test can be used) for the duration of the COVID-19 declaration under Section 564(b)(1) of the Act, 21 U.S.C. section 360bbb-3(b)(1), unless the authorization is terminated or revoked.     Resp Syncytial Virus by PCR NEGATIVE NEGATIVE Final    Comment: (NOTE) Fact Sheet for Patients: bloggercourse.com  Fact Sheet for Healthcare Providers: seriousbroker.it  This test is not yet approved or cleared by the [place]  FDA and has been authorized for detection and/or diagnosis of SARS-CoV-2 by FDA under an Emergency Use Authorization (EUA). This EUA will remain in effect (meaning this test can be used) for  the duration of the COVID-19 declaration under Section 564(b)(1) of the Act, 21 U.S.C. section 360bbb-3(b)(1), unless the authorization is terminated or revoked.  Performed at [facility], [address] [place], [place] [address]   Blood culture (routine x 2)     Status: None (Preliminary result)   Collection Time: 06/30/24  3:43 AM   Specimen: BLOOD  Result Value Ref Range Status   Specimen Description BLOOD UNKNOWN  Final   Special Requests   Final    BOTTLES DRAWN AEROBIC AND ANAEROBIC Blood Culture results may not be optimal due to an inadequate volume of blood received in culture bottles   Culture   Final    NO GROWTH 3 DAYS Performed at [facility], [address], [place], [place] [address]    Report Status PENDING  Incomplete  Blood culture (routine x 2)     Status: None (Preliminary result)   Collection Time: 06/30/24  3:48 AM   Specimen: BLOOD  Result Value Ref Range Status   Specimen Description BLOOD UNKNOWN  Final   Special Requests   Final    BOTTLES DRAWN AEROBIC AND ANAEROBIC Blood Culture results may not be optimal due to an inadequate volume of blood received in culture bottles   Culture   Final    NO GROWTH 3 DAYS Performed at [facility], [address], [place], [place] [address]    Report  Status PENDING  Incomplete  Expectorated Sputum Assessment w Gram Stain, Rflx to Resp Cult     Status: None   Collection Time: 06/30/24 12:08 PM   Specimen: Sputum  Result Value Ref Range Status   Specimen Description SPUTUM  Final   Special Requests NONE  Final   Sputum evaluation   Final    Sputum specimen not acceptable for testing.  Please recollect.   RESULT CALLED TO, READ BACK BY AND VERIFIED WITH: [facility] AT 1302 ON 06/30/24 BY SS Performed at [facility], [address], [place], [place] [address]    Report Status 06/30/2024 FINAL  Final    Procedures and diagnostic studies:  DG Swallowing  Func-Speech Pathology Result Date: 07/02/2024 Table formatting from the original result was not included. Modified Barium Swallow Study Patient Details Name: MARCKUS HANOVER MRN: [id number] Date of Birth: 1934-01-20 Today's Date: 07/02/2024 HPI/PMH: HPI: Per H&P,  Tytan DAMION KANT is a 89 y.o. male with medical history significant of HTN, chronic HFpEF, COPD, chronic hypoxic respiratory failure on 3 L admits needed, CKD stage IIIa, bladder cancer status post BCG treatment, prostate cancer, AAA s/p repair, chronic RBBB, presented with worsening of cough wheezing shortness of breath and low oxygen . CTA Chest: Multifocal bilateral airspace consolidation, most severe in the left lower  lobe, most consistent with multifocal pneumonia, with aspiration also in the  differential diagnosis. Recommend follow-up chest imaging after treatment to  document resolution. Emphysema and diffuse bronchial wall thickening, which may contribute to  hypoxic respiratory failure. Clinical Impression: Clinical Impression: Pt presents with mild pharyngeal dysphagia, sensorimotor in nature. Single occurrence of frank aspiration noted secondary to bolus spilling into air following delayed swallow initiation/while pt was talking. Pt with delayed cough response with partial clearance. Aspiration did not reoccur despite challenging. Of note, given that this occurred on the first swallow of assessment and was not repeated with further trials, first swallow phenomenon can be accredited and particular trial deemed inadequate depiction of swallow function. Otherwise, only noted shallow penetration of thin liquid noted following swallow initiation at the pyriform sinus, penetration cleared with cued/independent throat clear/cough. Overall hyolaryngeal complex appears WFL, with only min reduced BOT/pharyngeal constriction leading to trace pharyngeal residue, cleared with cued/spontaneous swallow. Oral phase min prolonged, though grossly  functional.    Given baseline pulmonary/respiratory status and acute deconditioning, pt is at increased risk for aspiration/adverse events in the setting of aspiration. Recommend strict aspiration precautions, slow rate, small bites, elevated HOB, and alert for PO intake. Additionally, recommend intermittent throat clear and secondary swallow with liquid trials. Continue with regular solids and thin liquids. Education shared with pt/spouse regarding results of assessment and recommendations for diet/aspiration precautions. MD and RN aware of recommendations. SLP will follow up for further education for compensatory strategies and assessment of application. Factors that may increase risk of adverse event in presence of aspiration [name] 2021): Factors that may increase risk of adverse event in presence of aspiration [name] 2021): Respiratory or GI disease; Frail or deconditioned Recommendations/Plan: Swallowing Evaluation Recommendations Swallowing Evaluation Recommendations Recommendations: PO diet PO Diet Recommendation: Thin liquids (Level 0); Regular Liquid Administration via: Cup; Straw (monitor straw use) Medication Administration: Whole meds with puree Supervision: Patient able to self-feed; Full supervision/cueing for swallowing strategies Swallowing strategies  : Minimize environmental distractions; Slow rate; Small bites/sips; Clear throat intermittently; Multiple dry swallows after each bite/sip Postural changes: Stay upright 30-60 min after meals; Position pt fully upright for meals Oral  care recommendations: Oral care BID (2x/day) Treatment Plan Treatment Plan Treatment recommendations: Therapy as outlined in treatment plan below Follow-up recommendations: Follow physicians's recommendations for discharge plan and follow up therapies Functional status assessment: Patient has had a recent decline in their functional status and demonstrates the ability to make significant improvements  in function in a reasonable and predictable amount of time. Treatment frequency: Min 2x/week Treatment duration: 2 weeks Interventions: Aspiration precaution training; Compensatory techniques; Patient/family education; Diet toleration management by SLP Recommendations Recommendations for follow up therapy are one component of a multi-disciplinary discharge planning process, led by the attending physician.  Recommendations may be updated based on patient status, additional functional criteria and insurance authorization. Assessment: Orofacial Exam: Orofacial Exam Oral Cavity: Oral Hygiene: WFL Oral Cavity - Dentition: Adequate natural dentition Orofacial Anatomy: WFL Oral Motor/Sensory Function: WFL Anatomy: Anatomy: WFL Boluses Administered: Boluses Administered Boluses Administered: Thin liquids (Level 0); Mildly thick liquids (Level 2, nectar thick); Puree; Solid  Oral Impairment Domain: Oral Impairment Domain Lip Closure: No labial escape Tongue control during bolus hold: Not tested Bolus preparation/mastication: Timely and efficient chewing and mashing Bolus transport/lingual motion: Brisk tongue motion Oral residue: Trace residue lining oral structures Location of oral residue : -- (diffuse) Initiation of pharyngeal swallow : Valleculae; Posterior laryngeal surface of the epiglottis; Pyriform sinuses  Pharyngeal Impairment Domain: Pharyngeal Impairment Domain Soft palate elevation: No bolus between soft palate (SP)/pharyngeal wall (PW) Laryngeal elevation: Complete superior movement of thyroid  cartilage with complete approximation of arytenoids to epiglottic petiole Anterior hyoid excursion: Complete anterior movement Epiglottic movement: Complete inversion Laryngeal vestibule closure: Complete, no air/contrast in laryngeal vestibule Pharyngeal stripping wave : Present - diminished Pharyngeal contraction (A/P view only): N/A Pharyngoesophageal segment opening: Complete distension and complete duration, no  obstruction of flow Tongue base retraction: Trace column of contrast or air between tongue base and PPW Pharyngeal residue: Trace residue within or on pharyngeal structures Location of pharyngeal residue: Valleculae; Pyriform sinuses  Esophageal Impairment Domain: Esophageal Impairment Domain Esophageal clearance upright position: Complete clearance, esophageal coating Pill: Pill Consistency administered: -- (n/a) Penetration/Aspiration Scale Score: Penetration/Aspiration Scale Score 1.  Material does not enter airway: Puree; Solid; Mildly thick liquids (Level 2, nectar thick) 2.  Material enters airway, remains ABOVE vocal cords then ejected out: Thin liquids (Level 0) 7.  Material enters airway, passes BELOW cords and not ejected out despite cough attempt by patient: Thin liquids (Level 0) Compensatory Strategies: Compensatory Strategies Compensatory strategies: Yes Multiple swallows: Effective Effective Multiple Swallows: Thin liquid (Level 0)   General Information: Caregiver present: Yes (spouse)  Diet Prior to this Study: Regular; Thin liquids (Level 0)   Temperature : Normal   Respiratory Status: WFL   Supplemental O2: Nasal cannula   History of Recent Intubation: No  Behavior/Cognition: Alert; Cooperative Self-Feeding Abilities: Able to self-feed Baseline vocal quality/speech: Normal Volitional Cough: Able to elicit Volitional Swallow: Able to elicit No data recorded Goal Planning: Prognosis for improved oropharyngeal function: Fair Barriers to Reach Goals: Overall medical prognosis (respiratory compromise) No data recorded No data recorded Consulted and agree with results and recommendations: Patient; Family member/caregiver Pain: Pain Assessment Pain Assessment: No/denies pain End of Session: Start Time:SLP Start Time (ACUTE ONLY): 0815 Stop Time: SLP Stop Time (ACUTE ONLY): 0900 Time Calculation:SLP Time Calculation (min) (ACUTE ONLY): 45 min Charges: SLP Evaluations $ SLP Speech Visit: 1 Visit SLP  Evaluations $MBS Swallow: 1 Procedure SLP visit diagnosis: SLP Visit Diagnosis: Dysphagia, pharyngeal phase (R13.13) Past Medical History: Past Medical History: Diagnosis  Date  AAA (abdominal aortic aneurysm)   a.) s/p repair in 2005  Anemia   Anginal pain   Anxiety   Arthritis   B12 deficiency   Bilateral cataracts   a.) s/p BILATERAL extractions in 2018  BPH with obstruction/lower urinary tract symptoms   CAD (coronary artery disease)   Carotid artery stenosis   a.) s/p CEA on the RIGHT  CHF (congestive heart failure) (HCC)   COPD (chronic obstructive pulmonary disease) (HCC)   Diastolic dysfunction 07/29/2019  a.)  TTE 07/29/2019: EF 50-55%; LA mildly enlarged; G1DD.  DOE (dyspnea on exertion)   Elevated PSA   Environmental and seasonal allergies   History of 2019 novel coronavirus disease (COVID-19)   History of kidney stones   HLD (hyperlipidemia)   HOH (hard of hearing)   HTN (hypertension)   Incomplete bladder emptying   Macular degeneration   Nausea vomiting and diarrhea 09/16/2023  Prostate cancer (HCC)   RBBB (right bundle branch block)   Valvular insufficiency   a.) TTE 07/29/2019: LVEF 50-55%; LA mild enlarged; triv AR/PR, mild MR, mod TR. Past Surgical History: Past Surgical History: Procedure Laterality Date  ABDOMINAL AORTIC ANEURYSM REPAIR    CATARACT EXTRACTION W/PHACO Right 09/25/2016  Procedure: CATARACT EXTRACTION PHACO AND INTRAOCULAR LENS PLACEMENT (IOC);  Surgeon: Elsie Carmine, MD;  Location: [facility] ORS;  Service: Ophthalmology;  Laterality: Right;  US  01:01 AP% 17.4 CDE 10.75 Fluid pack lot # [id number]  CATARACT EXTRACTION W/PHACO Left 10/16/2016  Procedure: CATARACT EXTRACTION PHACO AND INTRAOCULAR LENS PLACEMENT (IOC) Suture placed in Left eye;  Surgeon: Elsie Carmine, MD;  Location: [facility] OR[facility];  Service: Ophthalmology;  Laterality: Left;  US  2:06.8 AP% 22.2 CDE 28.17 Fluid pack lot # [id number]  COLONOSCOPY WITH PROPOFOL  N/A 03/31/2019  Procedure: COLONOSCOPY WITH PROPOFOL ;  Surgeon: Gaylyn Gladis PENNER, MD;  Location: [facility] ENDOSCOPY;  Service: Endoscopy;  Laterality: N/A;  COLONOSCOPY WITH PROPOFOL  N/A 09/09/2020  Procedure: COLONOSCOPY WITH PROPOFOL ;  Surgeon: Maryruth Ole DASEN, MD;  Location: [facility] ENDOSCOPY;  Service: Endoscopy;  Laterality: N/A;  CYSTOSCOPY W/ RETROGRADES Bilateral 02/23/2020  Procedure: CYSTOSCOPY WITH RETROGRADE PYELOGRAM;  Surgeon: Twylla Glendia BROCKS, MD;  Location: [facility] ORS;  Service: Urology;  Laterality: Bilateral;  CYSTOSCOPY WITH BIOPSY N/A 02/23/2020  Procedure: CYSTOSCOPY WITH BIOPSY;  Surgeon: Twylla Glendia BROCKS, MD;  Location: [facility] ORS;  Service: Urology;  Laterality: N/A;  EYE SURGERY    HERNIA REPAIR    x 2  INGUINAL HERNIA REPAIR Right 12/02/2015  Procedure: HERNIA REPAIR INGUINAL ADULT;  Surgeon: Larinda Unknown Sharps, MD;  Location: [facility] ORS;  Service: General;  Laterality: Right;  PROSTATE SURGERY  2012  Right Carotid Endarterectomy    TOTAL HIP ARTHROPLASTY Right 08/01/2021  Procedure: TOTAL HIP ARTHROPLASTY;  Surgeon: Edie Norleen PARAS, MD;  Location: [facility] ORS;  Service: Orthopedics;  Laterality: Right; Jordan Jarrett Clapp, MS, CCC-SLP Speech Language Pathologist Rehab Services; [facility] [phone] ([email or web address]) Jordan J Clapp 07/02/2024, 9:38 AM              LOS: 3 days   Derak Schurman  [facility]   Pager on [email or web address]. If 7PM-7AM, please contact night-coverage at [email or web address]     07/03/2024, 1:52 PM           "

## 2024-07-04 DIAGNOSIS — J9601 Acute respiratory failure with hypoxia: Secondary | ICD-10-CM | POA: Diagnosis not present

## 2024-07-04 LAB — CBC
HCT: 36.2 % — ABNORMAL LOW (ref 39.0–52.0)
Hemoglobin: 11.9 g/dL — ABNORMAL LOW (ref 13.0–17.0)
MCH: 31 pg (ref 26.0–34.0)
MCHC: 32.9 g/dL (ref 30.0–36.0)
MCV: 94.3 fL (ref 80.0–100.0)
Platelets: 188 K/uL (ref 150–400)
RBC: 3.84 MIL/uL — ABNORMAL LOW (ref 4.22–5.81)
RDW: 13.5 % (ref 11.5–15.5)
WBC: 10.4 K/uL (ref 4.0–10.5)
nRBC: 0 % (ref 0.0–0.2)

## 2024-07-04 LAB — BASIC METABOLIC PANEL WITH GFR
Anion gap: 10 (ref 5–15)
BUN: 53 mg/dL — ABNORMAL HIGH (ref 8–23)
CO2: 20 mmol/L — ABNORMAL LOW (ref 22–32)
Calcium: 8.9 mg/dL (ref 8.9–10.3)
Chloride: 109 mmol/L (ref 98–111)
Creatinine, Ser: 1.7 mg/dL — ABNORMAL HIGH (ref 0.61–1.24)
GFR, Estimated: 38 mL/min — ABNORMAL LOW
Glucose, Bld: 137 mg/dL — ABNORMAL HIGH (ref 70–99)
Potassium: 4.2 mmol/L (ref 3.5–5.1)
Sodium: 139 mmol/L (ref 135–145)

## 2024-07-04 NOTE — Progress Notes (Addendum)
 "    Progress Note    Ruben Green  FMW:[id number] DOB: Jun 25, 1933  DOA: 06/30/2024 PCP: Sadie Manna, MD      Brief Narrative:    Medical records reviewed and are as summarized below:  Ruben Green is a 89 y.o. male with medical history significant for hypertension, chronic HFpEF, COPD, chronic hypoxic respiratory failure on home 3 L oxygen  nighttime at home, CKD stage IIIa, bladder cancer s/p BCG treatment, prostate cancer, AAA s/p repair, chronic right bundle branch block, who presented to the hospital with cough, wheezing, shortness of breath on low oxygen  saturation.  He developed a cough and wheezing with shortness of breath about 4 weeks ago.  He went to see his PCP and was diagnosed with pneumonia.  He has received 2 rounds of antibiotics thus far.  First antibiotic was azithromycin .  Patient and wife are not sure about the second antibiotic.  At night prior to admission, his wife noticed that his oxygen  saturation was in the 70s on 2 L oxygen .  EMS was called.  Oxygen  saturation was 85% on 4 L oxygen  when EMS checked it.     Vital signs in the ED: Temperature 97.8 F, respiratory rate 22, heart rate up to 141, BP 181/69, oxygen  saturation 94% on 4 L.  Initial lactic acid 2.3 which went up to 4.9 down to 3.0.  CTA chest IMPRESSION: 1. No evidence of pulmonary embolism. 2. Multifocal bilateral airspace consolidation, most severe in the left lower lobe, most consistent with multifocal pneumonia, with aspiration also in the differential diagnosis. Recommend follow-up chest imaging after treatment to document resolution. 3. Emphysema and diffuse bronchial wall thickening, which may contribute to hypoxic respiratory failure. 4. Enlarged subcarinal lymph node measuring 1.6 cm, favored reactive in the setting of pneumonia. Recommend follow-up chest CT in approximately 3 months to confirm improvement or resolution.   Assessment/Plan:   Principal Problem:    Acute respiratory failure with hypoxia (HCC) Active Problems:   Multifocal pneumonia   Pneumonia    Body mass index is 25.24 kg/m.   Severe sepsis secondary to multifocal pneumonia: Continue IV ceftriaxone  and doxycycline .  Strep pneumo urine antigen is negative.  Legionella urine antigen is negative.  No growth on blood cultures thus far. Speech therapist recommended regular diet.   Acute on chronic hypoxic respiratory failure: Oxygen  weaned down from 4.5 to 3.5 L via [place] which is baseline for him.  He was previously treated with BiPAP in the ED. He uses 3 L oxygen  nightly at home.   Sinus tachycardia: Tachycardia has improved.    COPD exacerbation: Continue prednisone  and bronchodilators.    Mildly elevated but flat troponins: Troponin is 30, 43, 76, 74.  This is likely due to demand ischemia. Recent chest pain likely from pneumonia. Repeat 2D echo is pending.   Chronic HFpEF: Compensated. Resume home torsemide  (dose had been increased from 10 mg to 20 mg daily by nephrologist on 04/16/2024) proBNP 2326. 2D echo in May 2025 showed EF estimated at 60 to 65%, grade 1 diastolic dysfunction, mild MR   CKD stage IIIb: Creatinine is stable. Chart review shows shows GFR suggestive of CKD stage IIIb not stage IIIa.   General weakness: He has been evaluated by PT.  Possible discharge to home   Comorbidities include CAD s/p cardiac cath, AAA s/p repair in 2005, carotid artery disease s/p right CEA   Discontinue telemetry.  Transfer from progressive care unit to MedSurg unit.  Plan of care discussed  with patient and wife at the bedside.  Possible discharge home tomorrow.   Diet Order             Diet Heart Room service appropriate? Yes; Fluid consistency: Thin  Diet effective now                                  Consultants: None  Procedures: None    Medications:    aspirin  EC  81 mg Oral Daily   atorvastatin   40 mg Oral Daily    budesonide -glycopyrrolate -formoterol   2 puff Inhalation BID   doxycycline   100 mg Oral Q12H   enoxaparin  (LOVENOX ) injection  30 mg Subcutaneous Q24H   metoprolol  succinate  50 mg Oral Daily   tamsulosin   0.4 mg Oral Daily   torsemide   20 mg Oral Daily   Continuous Infusions:  cefTRIAXone  (ROCEPHIN )  IV 2 g (07/04/24 0529)     Anti-infectives (From admission, onward)    Start     Dose/Rate Route Frequency Ordered Stop   07/03/24 1800  doxycycline  (VIBRA -TABS) tablet 100 mg        100 mg Oral Every 12 hours 07/03/24 1610 07/08/24 1759   07/01/24 0600  cefTRIAXone  (ROCEPHIN ) 2 g in sodium chloride  0.9 % 100 mL IVPB        2 g 200 mL/hr over 30 Minutes Intravenous Every 24 hours 06/30/24 0827 07/06/24 0559   07/01/24 0600  doxycycline  (VIBRAMYCIN ) 100 mg in sodium chloride  0.9 % 250 mL IVPB  Status:  Discontinued        100 mg 125 mL/hr over 120 Minutes Intravenous Every 12 hours 06/30/24 0856 07/03/24 1610   06/30/24 0430  azithromycin  (ZITHROMAX ) 500 mg in sodium chloride  0.9 % 250 mL IVPB        500 mg 250 mL/hr over 60 Minutes Intravenous  Once 06/30/24 0342 06/30/24 0732   06/30/24 0345  cefTRIAXone  (ROCEPHIN ) 2 g in sodium chloride  0.9 % 100 mL IVPB        2 g 200 mL/hr over 30 Minutes Intravenous  Once 06/30/24 0342 06/30/24 0558              Family Communication/Anticipated D/C date and plan/Code Status   DVT prophylaxis: enoxaparin  (LOVENOX ) injection 30 mg Start: 06/30/24 2200     Code Status: Full Code  Family Communication: Plan discussed with his wife at the bedside Disposition Plan: Plan to discharge home   Status is: Inpatient Remains inpatient appropriate because: Severe sepsis from pneumonia       Subjective:   Interval events noted.  No shortness of breath or chest pain.  He still has a cough.  Overall, he is feeling better.  Wife at the bedside.  Objective:    Vitals:   07/04/24 0500 07/04/24 0843 07/04/24 0907 07/04/24 1011  BP:   (!) 152/89  (!) 145/74  Pulse:  81  82  Resp: 16 18    Temp:  (!) 97.3 F (36.3 C)    TempSrc:  Oral    SpO2:  95% 94%   Weight:      Height:       No data found.   Intake/Output Summary (Last 24 hours) at 07/04/2024 1157 Last data filed at 07/03/2024 1300 Gross per 24 hour  Intake 730 ml  Output --  Net 730 ml   Filed Weights   06/30/24 0243  Weight: 62.6 kg  Exam:  GEN: NAD SKIN: Warm and dry EYES: No pallor or icterus ENT: MMM CV: RRR PULM: Mild bibasilar rales.  No wheezing or rhonchi ABD: soft, ND, NT, +BS CNS: AAO x 3, non focal EXT: Bilateral pedal edema (right greater than left)       Data Reviewed:   I have personally reviewed following labs and imaging studies:  Labs: Labs show the following:   Basic Metabolic Panel: Recent Labs  Lab 06/30/24 0247 06/30/24 0545 07/01/24 0429 07/02/24 0355 07/03/24 0441 07/04/24 0409  NA 138  --  137 137 137 139  K 5.1  --  4.6 4.5 4.5 4.2  CL 108  --  108 108 108 109  CO2 20*  --  19* 17* 19* 20*  GLUCOSE 138*  --  167* 171* 134* 137*  BUN 52*  --  40* 40* 41* 53*  CREATININE 1.70*  --  1.38* 1.35* 1.42* 1.70*  CALCIUM  9.5  --  8.7* 8.5* 8.7* 8.9  MG 2.1 1.9  --   --   --   --   PHOS  --  2.5  --   --   --   --    GFR Estimated Creatinine Clearance: 23.2 mL/min (A) (by C-G formula based on SCr of 1.7 mg/dL (H)). Liver Function Tests: Recent Labs  Lab 06/30/24 0247 07/02/24 0355  AST 22 16  ALT 16 12  ALKPHOS 87 54  BILITOT 0.5 0.3  PROT 7.0 5.7*  ALBUMIN 4.3 3.2*   No results for input(s): LIPASE, AMYLASE in the last 168 hours. No results for input(s): AMMONIA in the last 168 hours. Coagulation profile No results for input(s): INR, PROTIME in the last 168 hours.  CBC: Recent Labs  Lab 06/30/24 0247 07/01/24 0429 07/03/24 0441 07/04/24 0409  WBC 13.2* 15.1* 12.4* 10.4  NEUTROABS 11.6*  --   --   --   HGB 13.6 11.3* 11.3* 11.9*  HCT 43.5 35.0* 35.2* 36.2*  MCV 99.8  96.7 96.2 94.3  PLT 188 160 180 188   Cardiac Enzymes: No results for input(s): CKTOTAL, CKMB, CKMBINDEX, TROPONINI in the last 168 hours. BNP (last 3 results) Recent Labs    06/30/24 0247  PROBNP 2,326.0*   CBG: No results for input(s): GLUCAP in the last 168 hours. D-Dimer: No results for input(s): DDIMER in the last 72 hours. Hgb A1c: No results for input(s): HGBA1C in the last 72 hours. Lipid Profile: No results for input(s): CHOL, HDL, LDLCALC, TRIG, CHOLHDL, LDLDIRECT in the last 72 hours. Thyroid  function studies: No results for input(s): TSH, T4TOTAL, T3FREE, THYROIDAB in the last 72 hours.  Invalid input(s): FREET3 Anemia work up: No results for input(s): VITAMINB12, FOLATE, FERRITIN, TIBC, IRON , RETICCTPCT in the last 72 hours. Sepsis Labs: Recent Labs  Lab 06/30/24 0247 06/30/24 0535 06/30/24 1208 06/30/24 2010 07/01/24 0429 07/02/24 1320 07/03/24 0441 07/04/24 0409  PROCALCITON  --  0.24  --   --   --   --   --   --   WBC 13.2*  --   --   --  15.1*  --  12.4* 10.4  LATICACIDVEN  --  2.3* 4.9* 3.0*  --  1.6  --   --     Microbiology Recent Results (from the past 240 hours)  Resp panel by RT-PCR (RSV, Flu A&B, Covid) Anterior Nasal Swab     Status: None   Collection Time: 06/30/24  2:47 AM   Specimen: Anterior Nasal Swab  Result Value Ref Range Status   SARS Coronavirus 2 by RT PCR NEGATIVE NEGATIVE Final    Comment: (NOTE) SARS-CoV-2 target nucleic acids are NOT DETECTED.  The SARS-CoV-2 RNA is generally detectable in upper respiratory specimens during the acute phase of infection. The lowest concentration of SARS-CoV-2 viral copies this assay can detect is 138 copies/mL. A negative result does not preclude SARS-Cov-2 infection and should not be used as the sole basis for treatment or other patient management decisions. A negative result may occur with  improper specimen collection/handling, submission  of specimen other than nasopharyngeal swab, presence of viral mutation(s) within the areas targeted by this assay, and inadequate number of viral copies(<138 copies/mL). A negative result must be combined with clinical observations, patient history, and epidemiological information. The expected result is Negative.  Fact Sheet for Patients:  bloggercourse.com  Fact Sheet for Healthcare Providers:  seriousbroker.it  This test is no t yet approved or cleared by the [place]  FDA and  has been authorized for detection and/or diagnosis of SARS-CoV-2 by FDA under an Emergency Use Authorization (EUA). This EUA will remain  in effect (meaning this test can be used) for the duration of the COVID-19 declaration under Section 564(b)(1) of the Act, 21 U.S.C.section 360bbb-3(b)(1), unless the authorization is terminated  or revoked sooner.       Influenza A by PCR NEGATIVE NEGATIVE Final   Influenza B by PCR NEGATIVE NEGATIVE Final    Comment: (NOTE) The Xpert Xpress SARS-CoV-2/FLU/RSV plus assay is intended as an aid in the diagnosis of influenza from Nasopharyngeal swab specimens and should not be used as a sole basis for treatment. Nasal washings and aspirates are unacceptable for Xpert Xpress SARS-CoV-2/FLU/RSV testing.  Fact Sheet for Patients: bloggercourse.com  Fact Sheet for Healthcare Providers: seriousbroker.it  This test is not yet approved or cleared by the [place]  FDA and has been authorized for detection and/or diagnosis of SARS-CoV-2 by FDA under an Emergency Use Authorization (EUA). This EUA will remain in effect (meaning this test can be used) for the duration of the COVID-19 declaration under Section 564(b)(1) of the Act, 21 U.S.C. section 360bbb-3(b)(1), unless the authorization is terminated or revoked.     Resp Syncytial Virus by PCR NEGATIVE NEGATIVE  Final    Comment: (NOTE) Fact Sheet for Patients: bloggercourse.com  Fact Sheet for Healthcare Providers: seriousbroker.it  This test is not yet approved or cleared by the [place]  FDA and has been authorized for detection and/or diagnosis of SARS-CoV-2 by FDA under an Emergency Use Authorization (EUA). This EUA will remain in effect (meaning this test can be used) for the duration of the COVID-19 declaration under Section 564(b)(1) of the Act, 21 U.S.C. section 360bbb-3(b)(1), unless the authorization is terminated or revoked.  Performed at [facility], [address] [place], [place] [address]   Blood culture (routine x 2)     Status: None (Preliminary result)   Collection Time: 06/30/24  3:43 AM   Specimen: BLOOD  Result Value Ref Range Status   Specimen Description BLOOD UNKNOWN  Final   Special Requests   Final    BOTTLES DRAWN AEROBIC AND ANAEROBIC Blood Culture results may not be optimal due to an inadequate volume of blood received in culture bottles   Culture   Final    NO GROWTH 4 DAYS Performed at [facility], [address] [place], [place] [address]    Report Status PENDING  Incomplete  Blood culture (routine x 2)  Status: None (Preliminary result)   Collection Time: 06/30/24  3:48 AM   Specimen: BLOOD  Result Value Ref Range Status   Specimen Description BLOOD UNKNOWN  Final   Special Requests   Final    BOTTLES DRAWN AEROBIC AND ANAEROBIC Blood Culture results may not be optimal due to an inadequate volume of blood received in culture bottles   Culture   Final    NO GROWTH 4 DAYS Performed at [facility], [address] [place], [place] [address]    Report Status PENDING  Incomplete  Expectorated Sputum Assessment w Gram Stain, Rflx to Resp Cult     Status: None   Collection Time: 06/30/24 12:08 PM   Specimen: Sputum  Result Value Ref Range Status   Specimen  Description SPUTUM  Final   Special Requests NONE  Final   Sputum evaluation   Final    Sputum specimen not acceptable for testing.  Please recollect.   RESULT CALLED TO, READ BACK BY AND VERIFIED WITH: PINEDA [facility] AT 1302 ON 06/30/24 BY SS Performed at [facility], [address] [place] [place], [place] [address]    Report Status 06/30/2024 FINAL  Final    Procedures and diagnostic studies:  ECHOCARDIOGRAM COMPLETE Result Date: 07/03/2024    ECHOCARDIOGRAM REPORT   Patient Name:   ZARIUS FURR Date of Exam: 07/03/2024 Medical Rec #:  [id number]           Height:       62.0 in Accession #:    [id number]          Weight:       138.0 lb Date of Birth:  1934/05/08           BSA:          1.633 m Patient Age:    89 years            BP:           156/79 mmHg Patient Gender: M                   HR:           79 bpm. Exam Location:  [facility] Procedure: 2D Echo, Cardiac Doppler and Color Doppler (Both Spectral and Color            Flow Doppler were utilized during procedure). Indications:     Chest pain R07.9                  elevated troponin  History:         Patient has prior history of Echocardiogram examinations, most                  recent 10/18/2023. CAD, COPD; Risk Factors:Hypertension.  Sonographer:     Christopher Furnace Referring Phys:  [id number] AIDA CHO Diagnosing Phys: Cara JONETTA Lovelace MD  Sonographer Comments: Technically challenging study due to limited acoustic windows, no parasternal window and no apical window. Image acquisition challenging due to COPD. IMPRESSIONS  1. Left ventricular ejection fraction, by estimation, is 55 to 60%. The left ventricle has normal function. The left ventricle has no regional wall motion abnormalities. There is mild left ventricular hypertrophy. Left ventricular diastolic function could not be evaluated.  2. Right ventricular systolic function is normal. The right ventricular size is mildly enlarged.  3. Left atrial size was moderately dilated.  4. Right  atrial size was mildly dilated.  5. The mitral valve was  not well visualized. Trivial mitral valve regurgitation.  6. The aortic valve was not well visualized. Aortic valve regurgitation is not visualized.  7. Aortic no well seen. FINDINGS  Left Ventricle: Left ventricular ejection fraction, by estimation, is 55 to 60%. The left ventricle has normal function. The left ventricle has no regional wall motion abnormalities. Strain was performed and the global longitudinal strain is indeterminate. The left ventricular internal cavity size was normal in size. There is mild left ventricular hypertrophy. Left ventricular diastolic function could not be evaluated. Right Ventricle: The right ventricular size is mildly enlarged. Right vetricular wall thickness was not well visualized. Right ventricular systolic function is normal. Left Atrium: Left atrial size was moderately dilated. Right Atrium: Right atrial size was mildly dilated. Pericardium: Trivial pericardial effusion is present. Mitral Valve: The mitral valve was not well visualized. Trivial mitral valve regurgitation. Tricuspid Valve: The tricuspid valve is not well visualized. Tricuspid valve regurgitation is mild. Aortic Valve: The aortic valve was not well visualized. Aortic valve regurgitation is not visualized. Pulmonic Valve: The pulmonic valve was not well visualized. Pulmonic valve regurgitation is not visualized. Aorta: No well seen. IAS/Shunts: No atrial level shunt detected by color flow Doppler. Additional Comments: 3D was performed not requiring image post processing on an independent workstation and was indeterminate.  LEFT VENTRICLE PLAX 2D LVIDd:         4.50 cm LVIDs:         3.10 cm LV PW:         1.20 cm LV IVS:        1.30 cm  LEFT ATRIUM         Index LA diam:    5.20 cm 3.18 cm/m Dwayne JONETTA Lovelace MD Electronically signed by Cara JONETTA Lovelace MD Signature Date/Time: 07/03/2024/3:53:05 PM    Final                LOS: 4 days    Catherine Cubero  [facility]   Pager on [email or web address]. If 7PM-7AM, please contact night-coverage at [email or web address]     07/04/2024, 11:57 AM           "

## 2024-07-05 DIAGNOSIS — J9601 Acute respiratory failure with hypoxia: Secondary | ICD-10-CM | POA: Diagnosis not present

## 2024-07-05 LAB — CULTURE, BLOOD (ROUTINE X 2)
Culture: NO GROWTH
Culture: NO GROWTH

## 2024-07-05 NOTE — Progress Notes (Signed)
 "    Progress Note    Ruben Green  FMW:[id number] DOB: March 02, 1934  DOA: 06/30/2024 PCP: Sadie Manna, MD      Brief Narrative:    Medical records reviewed and are as summarized below:  Ruben Green is a 89 y.o. male with medical history significant for hypertension, chronic HFpEF, COPD, chronic hypoxic respiratory failure on home 3 L oxygen  nighttime at home, CKD stage IIIa, bladder cancer s/p BCG treatment, prostate cancer, AAA s/p repair, chronic right bundle branch block, who presented to the hospital with cough, wheezing, shortness of breath on low oxygen  saturation.  He developed a cough and wheezing with shortness of breath about 4 weeks ago.  He went to see his PCP and was diagnosed with pneumonia.  He has received 2 rounds of antibiotics thus far.  First antibiotic was azithromycin .  Patient and wife are not sure about the second antibiotic.  At night prior to admission, his wife noticed that his oxygen  saturation was in the 70s on 2 L oxygen .  EMS was called.  Oxygen  saturation was 85% on 4 L oxygen  when EMS checked it.     Vital signs in the ED: Temperature 97.8 F, respiratory rate 22, heart rate up to 141, BP 181/69, oxygen  saturation 94% on 4 L.  Initial lactic acid 2.3 which went up to 4.9 down to 3.0.  CTA chest IMPRESSION: 1. No evidence of pulmonary embolism. 2. Multifocal bilateral airspace consolidation, most severe in the left lower lobe, most consistent with multifocal pneumonia, with aspiration also in the differential diagnosis. Recommend follow-up chest imaging after treatment to document resolution. 3. Emphysema and diffuse bronchial wall thickening, which may contribute to hypoxic respiratory failure. 4. Enlarged subcarinal lymph node measuring 1.6 cm, favored reactive in the setting of pneumonia. Recommend follow-up chest CT in approximately 3 months to confirm improvement or resolution.   Assessment/Plan:   Principal Problem:    Acute respiratory failure with hypoxia (HCC) Active Problems:   Multifocal pneumonia   Pneumonia    Body mass index is 25.04 kg/m.   Severe sepsis secondary to multifocal pneumonia: Completed 6 days of IV ceftriaxone .  Continue oral doxycycline .   Strep pneumo urine antigen is negative.  Legionella urine antigen is negative.  No growth on blood cultures thus far. Speech therapist recommended regular diet.   Acute on chronic hypoxic respiratory failure: Oxygen  weaned down from 4.5 to 3.5 L to  2 L via [place].  He was previously treated with BiPAP in the ED. He uses 3 L oxygen  nightly at home.   Sinus tachycardia: Tachycardia has improved.    COPD exacerbation: Completed course of steroids. Continue bronchodilators.   Mildly elevated but flat troponins: Troponin is 30, 43, 76, 74.  This is likely due to demand ischemia. Recent chest pain likely from pneumonia. Repeat 2D echo is pending.   Chronic HFpEF: Compensated. Resume home torsemide  (dose had been increased from 10 mg to 20 mg daily by nephrologist on 04/16/2024) proBNP 2326. 2D echo in May 2025 showed EF estimated at 60 to 65%, grade 1 diastolic dysfunction, mild MR   CKD stage IIIb: Creatinine is stable. Chart review shows shows GFR suggestive of CKD stage IIIb not stage IIIa.   General weakness: He was previously evaluated by PT.  Wife requested reevaluation by PT because she thinks patient may need to go to rehab.  Requested reevaluation from physical therapist.     Comorbidities include CAD s/p cardiac cath, AAA s/p repair in  2005, carotid artery disease s/p right CEA   Discharge plan was discussed with the patient and wife at the bedside.  Wife is not comfortable taking patient home today.   Diet Order             Diet Heart Room service appropriate? Yes; Fluid consistency: Thin  Diet effective now                                   Consultants: None  Procedures: None    Medications:    aspirin  EC  81 mg Oral Daily   atorvastatin   40 mg Oral Daily   budesonide -glycopyrrolate -formoterol   2 puff Inhalation BID   doxycycline   100 mg Oral Q12H   enoxaparin  (LOVENOX ) injection  30 mg Subcutaneous Q24H   metoprolol  succinate  50 mg Oral Daily   tamsulosin   0.4 mg Oral Daily   torsemide   20 mg Oral Daily   Continuous Infusions:     Anti-infectives (From admission, onward)    Start     Dose/Rate Route Frequency Ordered Stop   07/03/24 1800  doxycycline  (VIBRA -TABS) tablet 100 mg        100 mg Oral Every 12 hours 07/03/24 1610 07/08/24 1759   07/01/24 0600  cefTRIAXone  (ROCEPHIN ) 2 g in sodium chloride  0.9 % 100 mL IVPB        2 g 200 mL/hr over 30 Minutes Intravenous Every 24 hours 06/30/24 0827 07/05/24 0553   07/01/24 0600  doxycycline  (VIBRAMYCIN ) 100 mg in sodium chloride  0.9 % 250 mL IVPB  Status:  Discontinued        100 mg 125 mL/hr over 120 Minutes Intravenous Every 12 hours 06/30/24 0856 07/03/24 1610   06/30/24 0430  azithromycin  (ZITHROMAX ) 500 mg in sodium chloride  0.9 % 250 mL IVPB        500 mg 250 mL/hr over 60 Minutes Intravenous  Once 06/30/24 0342 06/30/24 0732   06/30/24 0345  cefTRIAXone  (ROCEPHIN ) 2 g in sodium chloride  0.9 % 100 mL IVPB        2 g 200 mL/hr over 30 Minutes Intravenous  Once 06/30/24 0342 06/30/24 0558              Family Communication/Anticipated D/C date and plan/Code Status   DVT prophylaxis: enoxaparin  (LOVENOX ) injection 30 mg Start: 06/30/24 2200     Code Status: Full Code  Family Communication: Plan discussed with his wife at the bedside Disposition Plan: Plan to discharge home   Status is: Inpatient Remains inpatient appropriate because: Severe sepsis from pneumonia       Subjective:   Interval events noted.  He complains of cough and general weakness.  No other complaints.  No shortness of breath or  chest pain.  Wife at the bedside.  Objective:    Vitals:   07/05/24 0517 07/05/24 0851 07/05/24 0930 07/05/24 1319  BP: 131/72 (!) 154/78  122/71  Pulse: 73 80  96  Resp: 18 20  18   Temp: 97.6 F (36.4 C) (!) 94 F (34.4 C) 97.7 F (36.5 C) 99 F (37.2 C)  TempSrc:   Oral   SpO2: 93% 91%  95%  Weight: 62.1 kg     Height:       No data found.   Intake/Output Summary (Last 24 hours) at 07/05/2024 1549 Last data filed at 07/04/2024 2017 Gross per 24 hour  Intake 300 ml  Output 750  ml  Net -450 ml   Filed Weights   06/30/24 0243 07/05/24 0517  Weight: 62.6 kg 62.1 kg    Exam:   GEN: NAD SKIN: Warm and dry EYES: No pallor or icterus ENT: MMM CV: RRR PULM: Mild bibasilar rales.  No rhonchi or wheezing ABD: soft, ND, NT, +BS CNS: AAO x 3, non focal EXT: Bilateral pedal edema.  No tenderness    Data Reviewed:   I have personally reviewed following labs and imaging studies:  Labs: Labs show the following:   Basic Metabolic Panel: Recent Labs  Lab 06/30/24 0247 06/30/24 0545 07/01/24 0429 07/02/24 0355 07/03/24 0441 07/04/24 0409  NA 138  --  137 137 137 139  K 5.1  --  4.6 4.5 4.5 4.2  CL 108  --  108 108 108 109  CO2 20*  --  19* 17* 19* 20*  GLUCOSE 138*  --  167* 171* 134* 137*  BUN 52*  --  40* 40* 41* 53*  CREATININE 1.70*  --  1.38* 1.35* 1.42* 1.70*  CALCIUM  9.5  --  8.7* 8.5* 8.7* 8.9  MG 2.1 1.9  --   --   --   --   PHOS  --  2.5  --   --   --   --    GFR Estimated Creatinine Clearance: 23.2 mL/min (A) (by C-G formula based on SCr of 1.7 mg/dL (H)). Liver Function Tests: Recent Labs  Lab 06/30/24 0247 07/02/24 0355  AST 22 16  ALT 16 12  ALKPHOS 87 54  BILITOT 0.5 0.3  PROT 7.0 5.7*  ALBUMIN 4.3 3.2*   No results for input(s): LIPASE, AMYLASE in the last 168 hours. No results for input(s): AMMONIA in the last 168 hours. Coagulation profile No results for input(s): INR, PROTIME in the last 168 hours.  CBC: Recent  Labs  Lab 06/30/24 0247 07/01/24 0429 07/03/24 0441 07/04/24 0409  WBC 13.2* 15.1* 12.4* 10.4  NEUTROABS 11.6*  --   --   --   HGB 13.6 11.3* 11.3* 11.9*  HCT 43.5 35.0* 35.2* 36.2*  MCV 99.8 96.7 96.2 94.3  PLT 188 160 180 188   Cardiac Enzymes: No results for input(s): CKTOTAL, CKMB, CKMBINDEX, TROPONINI in the last 168 hours. BNP (last 3 results) Recent Labs    06/30/24 0247  PROBNP 2,326.0*   CBG: No results for input(s): GLUCAP in the last 168 hours. D-Dimer: No results for input(s): DDIMER in the last 72 hours. Hgb A1c: No results for input(s): HGBA1C in the last 72 hours. Lipid Profile: No results for input(s): CHOL, HDL, LDLCALC, TRIG, CHOLHDL, LDLDIRECT in the last 72 hours. Thyroid  function studies: No results for input(s): TSH, T4TOTAL, T3FREE, THYROIDAB in the last 72 hours.  Invalid input(s): FREET3 Anemia work up: No results for input(s): VITAMINB12, FOLATE, FERRITIN, TIBC, IRON , RETICCTPCT in the last 72 hours. Sepsis Labs: Recent Labs  Lab 06/30/24 0247 06/30/24 0535 06/30/24 1208 06/30/24 2010 07/01/24 0429 07/02/24 1320 07/03/24 0441 07/04/24 0409  PROCALCITON  --  0.24  --   --   --   --   --   --   WBC 13.2*  --   --   --  15.1*  --  12.4* 10.4  LATICACIDVEN  --  2.3* 4.9* 3.0*  --  1.6  --   --     Microbiology Recent Results (from the past 240 hours)  Resp panel by RT-PCR (RSV, Flu A&B, Covid) Anterior Nasal Swab  Status: None   Collection Time: 06/30/24  2:47 AM   Specimen: Anterior Nasal Swab  Result Value Ref Range Status   SARS Coronavirus 2 by RT PCR NEGATIVE NEGATIVE Final    Comment: (NOTE) SARS-CoV-2 target nucleic acids are NOT DETECTED.  The SARS-CoV-2 RNA is generally detectable in upper respiratory specimens during the acute phase of infection. The lowest concentration of SARS-CoV-2 viral copies this assay can detect is 138 copies/mL. A negative result does not preclude  SARS-Cov-2 infection and should not be used as the sole basis for treatment or other patient management decisions. A negative result may occur with  improper specimen collection/handling, submission of specimen other than nasopharyngeal swab, presence of viral mutation(s) within the areas targeted by this assay, and inadequate number of viral copies(<138 copies/mL). A negative result must be combined with clinical observations, patient history, and epidemiological information. The expected result is Negative.  Fact Sheet for Patients:  bloggercourse.com  Fact Sheet for Healthcare Providers:  seriousbroker.it  This test is no t yet approved or cleared by the [place]  FDA and  has been authorized for detection and/or diagnosis of SARS-CoV-2 by FDA under an Emergency Use Authorization (EUA). This EUA will remain  in effect (meaning this test can be used) for the duration of the COVID-19 declaration under Section 564(b)(1) of the Act, 21 U.S.C.section 360bbb-3(b)(1), unless the authorization is terminated  or revoked sooner.       Influenza A by PCR NEGATIVE NEGATIVE Final   Influenza B by PCR NEGATIVE NEGATIVE Final    Comment: (NOTE) The Xpert Xpress SARS-CoV-2/FLU/RSV plus assay is intended as an aid in the diagnosis of influenza from Nasopharyngeal swab specimens and should not be used as a sole basis for treatment. Nasal washings and aspirates are unacceptable for Xpert Xpress SARS-CoV-2/FLU/RSV testing.  Fact Sheet for Patients: bloggercourse.com  Fact Sheet for Healthcare Providers: seriousbroker.it  This test is not yet approved or cleared by the [place]  FDA and has been authorized for detection and/or diagnosis of SARS-CoV-2 by FDA under an Emergency Use Authorization (EUA). This EUA will remain in effect (meaning this test can be used) for the duration of  the COVID-19 declaration under Section 564(b)(1) of the Act, 21 U.S.C. section 360bbb-3(b)(1), unless the authorization is terminated or revoked.     Resp Syncytial Virus by PCR NEGATIVE NEGATIVE Final    Comment: (NOTE) Fact Sheet for Patients: bloggercourse.com  Fact Sheet for Healthcare Providers: seriousbroker.it  This test is not yet approved or cleared by the [place]  FDA and has been authorized for detection and/or diagnosis of SARS-CoV-2 by FDA under an Emergency Use Authorization (EUA). This EUA will remain in effect (meaning this test can be used) for the duration of the COVID-19 declaration under Section 564(b)(1) of the Act, 21 U.S.C. section 360bbb-3(b)(1), unless the authorization is terminated or revoked.  Performed at [facility], [address] [place], [place] [address]   Blood culture (routine x 2)     Status: None   Collection Time: 06/30/24  3:43 AM   Specimen: BLOOD  Result Value Ref Range Status   Specimen Description BLOOD UNKNOWN  Final   Special Requests   Final    BOTTLES DRAWN AEROBIC AND ANAEROBIC Blood Culture results may not be optimal due to an inadequate volume of blood received in culture bottles   Culture   Final    NO GROWTH 5 DAYS Performed at [facility], [address] [place], [place] [address]  Report Status 07/05/2024 FINAL  Final  Blood culture (routine x 2)     Status: None   Collection Time: 06/30/24  3:48 AM   Specimen: BLOOD  Result Value Ref Range Status   Specimen Description BLOOD UNKNOWN  Final   Special Requests   Final    BOTTLES DRAWN AEROBIC AND ANAEROBIC Blood Culture results may not be optimal due to an inadequate volume of blood received in culture bottles   Culture   Final    NO GROWTH 5 DAYS Performed at [facility], [address], [place], [place] [address]    Report Status 07/05/2024 FINAL  Final  Expectorated Sputum  Assessment w Gram Stain, Rflx to Resp Cult     Status: None   Collection Time: 06/30/24 12:08 PM   Specimen: Sputum  Result Value Ref Range Status   Specimen Description SPUTUM  Final   Special Requests NONE  Final   Sputum evaluation   Final    Sputum specimen not acceptable for testing.  Please recollect.   RESULT CALLED TO, READ BACK BY AND VERIFIED WITH: [facility] AT 1302 ON 06/30/24 BY SS Performed at [facility], [address], [place], [place] [address]    Report Status 06/30/2024 FINAL  Final    Procedures and diagnostic studies:  No results found.              LOS: 5 days   Francee Setzer  [facility]   Pager on [email or web address]. If 7PM-7AM, please contact night-coverage at [email or web address]     07/05/2024, 3:49 PM           "

## 2024-07-06 NOTE — Progress Notes (Signed)
 Physical Therapy Treatment Patient Details Name: MATYAS BAISLEY MRN: [id number] DOB: 08/07/1933 Today's Date: 07/06/2024   History of Present Illness Cyril NIK GORRELL is a 89 y.o. male with medical history significant of HTN, chronic HFpEF, COPD, chronic hypoxic respiratory failure on 3 L admits needed, CKD stage IIIa, bladder cancer status post BCG treatment, prostate cancer, AAA s/p repair, chronic RBBB, presented with worsening of cough wheezing shortness of breath and low oxygen .    PT Comments  Patient seen for PT session focused on functional mobility. Patient required supervision progressing to ModI for 200' ambulation with no AD. Tolerated session well with no signs of exertion. Vitals remained stable during activity. Pt making good progress toward goal. Discharge recommendation HHPT.    If plan is discharge home, recommend the following: A little help with walking and/or transfers;A little help with bathing/dressing/bathroom;Help with stairs or ramp for entrance;Assist for transportation   Can travel by private vehicle        Equipment Recommendations  None recommended by PT    Recommendations for Other Services       Precautions / Restrictions Restrictions Weight Bearing Restrictions Per Provider Order: No     Mobility  Bed Mobility Overal bed mobility: Needs Assistance Bed Mobility: Supine to Sit, Sit to Supine     Supine to sit: Supervision Sit to supine: Supervision        Transfers Overall transfer level: Needs assistance Equipment used: Rolling walker (2 wheels) Transfers: Sit to/from Stand Sit to Stand: Supervision                Ambulation/Gait Ambulation/Gait assistance: Modified independent (Device/Increase time) Gait Distance (Feet): 200 Feet Assistive device: None Gait Pattern/deviations: Step-through pattern, Trunk flexed       General Gait Details: mildly unstable   Stairs             Wheelchair Mobility     Tilt  Bed    Modified Rankin (Stroke Patients Only)       Balance Overall balance assessment: Needs assistance   Sitting balance-Leahy Scale: Good     Standing balance support: During functional activity, Reliant on assistive device for balance Standing balance-Leahy Scale: Fair                              Hotel Manager: No apparent difficulties Factors Affecting Communication: Hearing impaired  Cognition Arousal: Alert Behavior During Therapy: WFL for tasks assessed/performed   PT - Cognitive impairments: No apparent impairments                         Following commands: Impaired Following commands impaired: Follows one step commands inconsistently    Cueing Cueing Techniques: Verbal cues  Exercises      General Comments        Pertinent Vitals/Pain Pain Assessment Pain Assessment: No/denies pain    Home Living                          Prior Function            PT Goals (current goals can now be found in the care plan section) Acute Rehab PT Goals Patient Stated Goal: Pt wants to go home PT Goal Formulation: With patient Time For Goal Achievement: 08/06/24 Progress towards PT goals: Progressing toward goals    Frequency    Min 2X/week  PT Plan      Co-evaluation              AM-PAC PT 6 Clicks Mobility   Outcome Measure  Help needed turning from your back to your side while in a flat bed without using bedrails?: None Help needed moving from lying on your back to sitting on the side of a flat bed without using bedrails?: None Help needed moving to and from a bed to a chair (including a wheelchair)?: None Help needed standing up from a chair using your arms (e.g., wheelchair or bedside chair)?: None Help needed to walk in hospital room?: None Help needed climbing 3-5 steps with a railing? : A Little 6 Click Score: 23    End of Session   Activity Tolerance: Patient  tolerated treatment well Patient left: in bed;with family/visitor present;with call bell/phone within reach Nurse Communication: Mobility status PT Visit Diagnosis: Other abnormalities of gait and mobility (R26.89);Difficulty in walking, not elsewhere classified (R26.2);Muscle weakness (generalized) (M62.81)     Time: 9180-9169 PT Time Calculation (min) (ACUTE ONLY): 11 min  Charges:    $Therapeutic Activity: 8-22 mins PT General Charges $$ ACUTE PT VISIT: 1 Visit                     Sherlean Lesches DPT, PT     Sherlean A Raylin Diguglielmo 07/06/2024, 8:37 AM

## 2024-07-06 NOTE — Plan of Care (Signed)

## 2024-07-06 NOTE — Plan of Care (Signed)

## 2024-07-06 NOTE — TOC Transition Note (Signed)
 Transition of Care [facility]) - Discharge Note   Patient Details  Name: Ruben Green MRN: [id number] Date of Birth: [birth year]  Transition of Care [facility]) CM/SW Contact:  Shasta DELENA Daring, RN Phone Number: 07/06/2024, 10:02 AM   Clinical Narrative:     [facility] has discharge orders. Accepted offer of Home PT from [facility]. Notified agency rep and linked in hub.  Provided contact info.  No additional TOC needs.  RNCM signing off.        Patient Goals and CMS Choice            Discharge Placement                       Discharge Plan and Services Additional resources added to the After Visit Summary for                                       Social Drivers of Health (SDOH) Interventions SDOH Screenings   Food Insecurity: No Food Insecurity (07/02/2024)  Housing: Low Risk (07/02/2024)  Transportation Needs: No Transportation Needs (07/04/2024)  Utilities: Not At Risk (07/04/2024)  Depression (PHQ2-9): Low Risk (03/30/2024)  Financial Resource Strain: Low Risk  (07/22/2023)   Received from [facility]  Social Connections: Moderately Isolated (07/04/2024)  Tobacco Use: Medium Risk (06/30/2024)     Readmission Risk Interventions    07/06/2024   10:01 AM 09/17/2023   12:40 PM 12/07/2022   10:47 AM  Readmission Risk Prevention Plan  Transportation Screening Complete    PCP or Specialist Appt within 3-5 Days Complete  Complete  HRI or Home Care Consult Complete  Complete  Social Work Consult for Recovery Care Planning/Counseling   Complete  Palliative Care Screening Not Applicable  Not Applicable  Medication Review Oceanographer) Complete    SW Recovery Care/Counseling Consult  Complete   Palliative Care Screening  Not Applicable   Skilled Nursing Facility  Not Applicable

## 2024-07-06 NOTE — Discharge Summary (Signed)
 " Physician Discharge Summary   Patient: Ruben Green MRN: [id number] DOB: 1933-09-14  Admit date:     06/30/2024  Discharge date: 07/06/24  Discharge Physician: AIDA CHO   PCP: Sadie Manna, MD   Recommendations at discharge:   Follow-up with PCP in 1 week  Discharge Diagnoses: Principal Problem:   Acute respiratory failure with hypoxia (HCC) Active Problems:   Multifocal pneumonia   Pneumonia  Resolved Problems:   * No resolved hospital problems. *  Hospital Course:  Ruben Green is a 89 y.o. male with medical history significant for hypertension, chronic HFpEF, COPD, chronic hypoxic respiratory failure on home 3 L oxygen  nighttime at home, CKD stage IIIa, bladder cancer s/p BCG treatment, prostate cancer, AAA s/p repair, chronic right bundle branch block, who presented to the hospital with cough, wheezing, shortness of breath on low oxygen  saturation.  He developed a cough and wheezing with shortness of breath about 4 weeks ago.  He went to see his PCP and was diagnosed with pneumonia.  He has received 2 rounds of antibiotics thus far.  First antibiotic was azithromycin .  Patient and wife are not sure about the second antibiotic.  At night prior to admission, his wife noticed that his oxygen  saturation was in the 70s on 2 L oxygen .  EMS was called.  Oxygen  saturation was 85% on 4 L oxygen  when EMS checked it.       Vital signs in the ED: Temperature 97.8 F, respiratory rate 22, heart rate up to 141, BP 181/69, oxygen  saturation 94% on 4 L.   Initial lactic acid 2.3 which went up to 4.9 down to 3.0.   CTA chest IMPRESSION: 1. No evidence of pulmonary embolism. 2. Multifocal bilateral airspace consolidation, most severe in the left lower lobe, most consistent with multifocal pneumonia, with aspiration also in the differential diagnosis. Recommend follow-up chest imaging after treatment to document resolution. 3. Emphysema and diffuse bronchial wall  thickening, which may contribute to hypoxic respiratory failure. 4. Enlarged subcarinal lymph node measuring 1.6 cm, favored reactive in the setting of pneumonia. Recommend follow-up chest CT in approximately 3 months to confirm improvement or resolution.      Assessment and Plan:   Severe sepsis secondary to multifocal pneumonia: Completed 6 days of IV ceftriaxone  and 7 days of doxycycline  Strep pneumo urine antigen is negative.  Legionella urine antigen is negative.  No growth on blood cultures thus far. Speech therapist recommended regular diet.     Acute on chronic hypoxic respiratory failure: Improved.  Oxygen  saturation on room air with ambulation was 94%.   He was previously treated with BiPAP in the ED and required up to 4.5 L oxygen  via nasal cannula.. He said he uses about 3 L oxygen  nightly at home.     Sinus tachycardia: Tachycardia has improved.      COPD exacerbation: Completed course of steroids. Continue bronchodilators.     Mildly elevated but flat troponins: Troponin is 30, 43, 76, 74.  This is likely due to demand ischemia. Recent chest pain likely from pneumonia. Repeat 2D on 07/03/2024 echo showed EF estimated at 55 to 60%, mild LVH, indeterminate LV diastolic parameters.     Chronic HFpEF: Compensated. Continue torsemide .  Patient and wife wanted dose to be decreased from 20 mg to 20 mg daily because of frequent micturition and improvement in peripheral edema. (Of note, torsemide  dose had been increased from 10 mg to 20 mg daily by nephrologist on 04/16/2024) proBNP 2326. 2D  echo in May 2025 showed EF estimated at 60 to 65%, grade 1 diastolic dysfunction, mild MR     CKD stage IIIb: Creatinine is stable.    General weakness: He was reevaluated by PT on the day of discharge.  Home health therapy was recommended.      Comorbidities include CAD s/p cardiac cath, AAA s/p repair in 2005, carotid artery disease s/p right CEA   His condition has  improved and he is deemed stable for discharge today.  Discharge plan was discussed with patient and wife at the bedside.  Patient and wife were very appreciative of the care they received in the hospital.      Consultants: None Procedures performed: None Disposition: Home health Diet recommendation:  Cardiac diet DISCHARGE MEDICATION: Allergies as of 07/06/2024       Reactions   Oxycodone  Other (See Comments)   Caused hallucinations/does not want anymore        Medication List     TAKE these medications    acetaminophen  500 MG tablet Commonly known as: TYLENOL  Take 1-2 tablets (500-1,000 mg total) by mouth every 6 (six) hours as needed for mild pain.   albuterol  108 (90 Base) MCG/ACT inhaler Commonly known as: VENTOLIN  HFA Inhale 2 puffs into the lungs every 4 (four) hours as needed.   aspirin  EC 81 MG tablet Take 81 mg by mouth.   atorvastatin  40 MG tablet Commonly known as: LIPITOR Take 1 tablet (40 mg total) by mouth daily.   cyanocobalamin  1000 MCG tablet Commonly known as: VITAMIN B12 Take 1,000 mcg by mouth.   empagliflozin  10 MG Tabs tablet Commonly known as: Jardiance  Take 1 tablet (10 mg total) by mouth daily before breakfast.   ferrous sulfate 325 (65 FE) MG tablet Take 325 mg by mouth.   ipratropium-albuterol  0.5-2.5 (3) MG/3ML Soln Commonly known as: DUONEB Inhale 3 mLs into the lungs.   LORazepam  0.5 MG tablet Commonly known as: ATIVAN  Take 0.5 mg by mouth daily as needed for anxiety.   metoprolol  succinate 50 MG 24 hr tablet Commonly known as: TOPROL -XL Take 1 tablet (50 mg total) by mouth daily.   tamsulosin  0.4 MG Caps capsule Commonly known as: FLOMAX  Take 1 capsule (0.4 mg total) by mouth daily.   torsemide  10 MG tablet Commonly known as: DEMADEX  Take 1 tablet (10 mg total) by mouth daily.   traMADol  50 MG tablet Commonly known as: ULTRAM  Take 1 tablet (50 mg total) by mouth every 4 (four) hours as needed for moderate pain  (pain score 4-6) or severe pain (pain score 7-10).   Trelegy Ellipta 100-62.5-25 MCG/INH Aepb Generic drug: Fluticasone -Umeclidin-Vilant Inhale 2 puffs into the lungs daily.        Discharge Exam: Filed Weights   06/30/24 0243 07/05/24 0517  Weight: 62.6 kg 62.1 kg   GEN: NAD SKIN: Warm and dry EYES: No pallor or icterus ENT: MMM CV: RRR PULM: Mild bibasilar rales.  No wheezing or rhonchi ABD: soft, ND, NT, +BS CNS: AAO x 3, non focal EXT: No edema or tenderness.  Pedal and ankle edema have improved.   Condition at discharge: good  The results of significant diagnostics from this hospitalization (including imaging, microbiology, ancillary and laboratory) are listed below for reference.   Imaging Studies: ECHOCARDIOGRAM COMPLETE Result Date: 07/03/2024    ECHOCARDIOGRAM REPORT   Patient Name:   Ruben Green Date of Exam: 07/03/2024 Medical Rec #:  [id number]  Height:       62.0 in Accession #:    [id number]          Weight:       138.0 lb Date of Birth:  [birth year]           BSA:          1.633 m Patient Age:    89 years            BP:           156/79 mmHg Patient Gender: M                   HR:           79 bpm. Exam Location:  [facility] Procedure: 2D Echo, Cardiac Doppler and Color Doppler (Both Spectral and Color            Flow Doppler were utilized during procedure). Indications:     Chest pain R07.9                  elevated troponin  History:         Patient has prior history of Echocardiogram examinations, most                  recent 10/18/2023. CAD, COPD; Risk Factors:Hypertension.  Sonographer:     Christopher Furnace Referring Phys:  [id number] AIDA CHO Diagnosing Phys: Cara JONETTA Lovelace MD  Sonographer Comments: Technically challenging study due to limited acoustic windows, no parasternal window and no apical window. Image acquisition challenging due to COPD. IMPRESSIONS  1. Left ventricular ejection fraction, by estimation, is 55 to 60%. The left ventricle has normal  function. The left ventricle has no regional wall motion abnormalities. There is mild left ventricular hypertrophy. Left ventricular diastolic function could not be evaluated.  2. Right ventricular systolic function is normal. The right ventricular size is mildly enlarged.  3. Left atrial size was moderately dilated.  4. Right atrial size was mildly dilated.  5. The mitral valve was not well visualized. Trivial mitral valve regurgitation.  6. The aortic valve was not well visualized. Aortic valve regurgitation is not visualized.  7. Aortic no well seen. FINDINGS  Left Ventricle: Left ventricular ejection fraction, by estimation, is 55 to 60%. The left ventricle has normal function. The left ventricle has no regional wall motion abnormalities. Strain was performed and the global longitudinal strain is indeterminate. The left ventricular internal cavity size was normal in size. There is mild left ventricular hypertrophy. Left ventricular diastolic function could not be evaluated. Right Ventricle: The right ventricular size is mildly enlarged. Right vetricular wall thickness was not well visualized. Right ventricular systolic function is normal. Left Atrium: Left atrial size was moderately dilated. Right Atrium: Right atrial size was mildly dilated. Pericardium: Trivial pericardial effusion is present. Mitral Valve: The mitral valve was not well visualized. Trivial mitral valve regurgitation. Tricuspid Valve: The tricuspid valve is not well visualized. Tricuspid valve regurgitation is mild. Aortic Valve: The aortic valve was not well visualized. Aortic valve regurgitation is not visualized. Pulmonic Valve: The pulmonic valve was not well visualized. Pulmonic valve regurgitation is not visualized. Aorta: No well seen. IAS/Shunts: No atrial level shunt detected by color flow Doppler. Additional Comments: 3D was performed not requiring image post processing on an independent workstation and was indeterminate.  LEFT  VENTRICLE PLAX 2D LVIDd:         4.50 cm LVIDs:  3.10 cm LV PW:         1.20 cm LV IVS:        1.30 cm  LEFT ATRIUM         Index LA diam:    5.20 cm 3.18 cm/m Cara JONETTA Lovelace MD Electronically signed by Cara JONETTA Lovelace MD Signature Date/Time: 07/03/2024/3:53:05 PM    Final    DG Swallowing Func-Speech Pathology Result Date: 07/02/2024 Table formatting from the original result was not included. Modified Barium Swallow Study Patient Details Name: Ruben Green MRN: [id number] Date of Birth: Oct 22, 1933 Today's Date: 07/02/2024 HPI/PMH: HPI: Per H&P,  Leonard CONSTANTINO STARACE is a 89 y.o. male with medical history significant of HTN, chronic HFpEF, COPD, chronic hypoxic respiratory failure on 3 L admits needed, CKD stage IIIa, bladder cancer status post BCG treatment, prostate cancer, AAA s/p repair, chronic RBBB, presented with worsening of cough wheezing shortness of breath and low oxygen . CTA Chest: Multifocal bilateral airspace consolidation, most severe in the left lower  lobe, most consistent with multifocal pneumonia, with aspiration also in the  differential diagnosis. Recommend follow-up chest imaging after treatment to  document resolution. Emphysema and diffuse bronchial wall thickening, which may contribute to  hypoxic respiratory failure. Clinical Impression: Clinical Impression: Pt presents with mild pharyngeal dysphagia, sensorimotor in nature. Single occurrence of frank aspiration noted secondary to bolus spilling into air following delayed swallow initiation/while pt was talking. Pt with delayed cough response with partial clearance. Aspiration did not reoccur despite challenging. Of note, given that this occurred on the first swallow of assessment and was not repeated with further trials, first swallow phenomenon can be accredited and particular trial deemed inadequate depiction of swallow function. Otherwise, only noted shallow penetration of thin liquid noted following swallow  initiation at the pyriform sinus, penetration cleared with cued/independent throat clear/cough. Overall hyolaryngeal complex appears WFL, with only min reduced BOT/pharyngeal constriction leading to trace pharyngeal residue, cleared with cued/spontaneous swallow. Oral phase min prolonged, though grossly functional.    Given baseline pulmonary/respiratory status and acute deconditioning, pt is at increased risk for aspiration/adverse events in the setting of aspiration. Recommend strict aspiration precautions, slow rate, small bites, elevated HOB, and alert for PO intake. Additionally, recommend intermittent throat clear and secondary swallow with liquid trials. Continue with regular solids and thin liquids. Education shared with pt/spouse regarding results of assessment and recommendations for diet/aspiration precautions. MD and RN aware of recommendations. SLP will follow up for further education for compensatory strategies and assessment of application. Factors that may increase risk of adverse event in presence of aspiration [name] 2021): Factors that may increase risk of adverse event in presence of aspiration [name] 2021): Respiratory or GI disease; Frail or deconditioned Recommendations/Plan: Swallowing Evaluation Recommendations Swallowing Evaluation Recommendations Recommendations: PO diet PO Diet Recommendation: Thin liquids (Level 0); Regular Liquid Administration via: Cup; Straw (monitor straw use) Medication Administration: Whole meds with puree Supervision: Patient able to self-feed; Full supervision/cueing for swallowing strategies Swallowing strategies  : Minimize environmental distractions; Slow rate; Small bites/sips; Clear throat intermittently; Multiple dry swallows after each bite/sip Postural changes: Stay upright 30-60 min after meals; Position pt fully upright for meals Oral care recommendations: Oral care BID (2x/day) Treatment Plan Treatment Plan Treatment  recommendations: Therapy as outlined in treatment plan below Follow-up recommendations: Follow physicians's recommendations for discharge plan and follow up therapies Functional status assessment: Patient has had a recent decline in their functional status and demonstrates the ability to make significant  improvements in function in a reasonable and predictable amount of time. Treatment frequency: Min 2x/week Treatment duration: 2 weeks Interventions: Aspiration precaution training; Compensatory techniques; Patient/family education; Diet toleration management by SLP Recommendations Recommendations for follow up therapy are one component of a multi-disciplinary discharge planning process, led by the attending physician.  Recommendations may be updated based on patient status, additional functional criteria and insurance authorization. Assessment: Orofacial Exam: Orofacial Exam Oral Cavity: Oral Hygiene: WFL Oral Cavity - Dentition: Adequate natural dentition Orofacial Anatomy: WFL Oral Motor/Sensory Function: WFL Anatomy: Anatomy: WFL Boluses Administered: Boluses Administered Boluses Administered: Thin liquids (Level 0); Mildly thick liquids (Level 2, nectar thick); Puree; Solid  Oral Impairment Domain: Oral Impairment Domain Lip Closure: No labial escape Tongue control during bolus hold: Not tested Bolus preparation/mastication: Timely and efficient chewing and mashing Bolus transport/lingual motion: Brisk tongue motion Oral residue: Trace residue lining oral structures Location of oral residue : -- (diffuse) Initiation of pharyngeal swallow : Valleculae; Posterior laryngeal surface of the epiglottis; Pyriform sinuses  Pharyngeal Impairment Domain: Pharyngeal Impairment Domain Soft palate elevation: No bolus between soft palate (SP)/pharyngeal wall (PW) Laryngeal elevation: Complete superior movement of thyroid  cartilage with complete approximation of arytenoids to epiglottic petiole Anterior hyoid excursion:  Complete anterior movement Epiglottic movement: Complete inversion Laryngeal vestibule closure: Complete, no air/contrast in laryngeal vestibule Pharyngeal stripping wave : Present - diminished Pharyngeal contraction (A/P view only): N/A Pharyngoesophageal segment opening: Complete distension and complete duration, no obstruction of flow Tongue base retraction: Trace column of contrast or air between tongue base and PPW Pharyngeal residue: Trace residue within or on pharyngeal structures Location of pharyngeal residue: Valleculae; Pyriform sinuses  Esophageal Impairment Domain: Esophageal Impairment Domain Esophageal clearance upright position: Complete clearance, esophageal coating Pill: Pill Consistency administered: -- (n/a) Penetration/Aspiration Scale Score: Penetration/Aspiration Scale Score 1.  Material does not enter airway: Puree; Solid; Mildly thick liquids (Level 2, nectar thick) 2.  Material enters airway, remains ABOVE vocal cords then ejected out: Thin liquids (Level 0) 7.  Material enters airway, passes BELOW cords and not ejected out despite cough attempt by patient: Thin liquids (Level 0) Compensatory Strategies: Compensatory Strategies Compensatory strategies: Yes Multiple swallows: Effective Effective Multiple Swallows: Thin liquid (Level 0)   General Information: Caregiver present: Yes (spouse)  Diet Prior to this Study: Regular; Thin liquids (Level 0)   Temperature : Normal   Respiratory Status: WFL   Supplemental O2: Nasal cannula   History of Recent Intubation: No  Behavior/Cognition: Alert; Cooperative Self-Feeding Abilities: Able to self-feed Baseline vocal quality/speech: Normal Volitional Cough: Able to elicit Volitional Swallow: Able to elicit No data recorded Goal Planning: Prognosis for improved oropharyngeal function: Fair Barriers to Reach Goals: Overall medical prognosis (respiratory compromise) No data recorded No data recorded Consulted and agree with results and recommendations:  Patient; Family member/caregiver Pain: Pain Assessment Pain Assessment: No/denies pain End of Session: Start Time:SLP Start Time (ACUTE ONLY): 0815 Stop Time: SLP Stop Time (ACUTE ONLY): 0900 Time Calculation:SLP Time Calculation (min) (ACUTE ONLY): 45 min Charges: SLP Evaluations $ SLP Speech Visit: 1 Visit SLP Evaluations $MBS Swallow: 1 Procedure SLP visit diagnosis: SLP Visit Diagnosis: Dysphagia, pharyngeal phase (R13.13) Past Medical History: Past Medical History: Diagnosis Date  AAA (abdominal aortic aneurysm)   a.) s/p repair in 2005  Anemia   Anginal pain   Anxiety   Arthritis   B12 deficiency   Bilateral cataracts   a.) s/p BILATERAL extractions in 2018  BPH with obstruction/lower urinary tract symptoms   CAD (coronary  artery disease)   Carotid artery stenosis   a.) s/p CEA on the RIGHT  CHF (congestive heart failure) (HCC)   COPD (chronic obstructive pulmonary disease) (HCC)   Diastolic dysfunction 07/29/2019  a.)  TTE 07/29/2019: EF 50-55%; LA mildly enlarged; G1DD.  DOE (dyspnea on exertion)   Elevated PSA   Environmental and seasonal allergies   History of 2019 novel coronavirus disease (COVID-19)   History of kidney stones   HLD (hyperlipidemia)   HOH (hard of hearing)   HTN (hypertension)   Incomplete bladder emptying   Macular degeneration   Nausea vomiting and diarrhea 09/16/2023  Prostate cancer (HCC)   RBBB (right bundle branch block)   Valvular insufficiency   a.) TTE 07/29/2019: LVEF 50-55%; LA mild enlarged; triv AR/PR, mild MR, mod TR. Past Surgical History: Past Surgical History: Procedure Laterality Date  ABDOMINAL AORTIC ANEURYSM REPAIR    CATARACT EXTRACTION W/PHACO Right 09/25/2016  Procedure: CATARACT EXTRACTION PHACO AND INTRAOCULAR LENS PLACEMENT (IOC);  Surgeon: Elsie Carmine, MD;  Location: [facility] ORS;  Service: Ophthalmology;  Laterality: Right;  US  01:01 AP% 17.4 CDE 10.75 Fluid pack lot # [id number]  CATARACT EXTRACTION W/PHACO Left 10/16/2016  Procedure: CATARACT EXTRACTION PHACO AND  INTRAOCULAR LENS PLACEMENT (IOC) Suture placed in Left eye;  Surgeon: Elsie Carmine, MD;  Location: [facility] ORS;  Service: Ophthalmology;  Laterality: Left;  US  2:06.8 AP% 22.2 CDE 28.17 Fluid pack lot # [id number]  COLONOSCOPY WITH PROPOFOL  N/A 03/31/2019  Procedure: COLONOSCOPY WITH PROPOFOL ;  Surgeon: Gaylyn Gladis PENNER, MD;  Location: [facility] ENDOSCOPY;  Service: Endoscopy;  Laterality: N/A;  COLONOSCOPY WITH PROPOFOL  N/A 09/09/2020  Procedure: COLONOSCOPY WITH PROPOFOL ;  Surgeon: Maryruth Ole DASEN, MD;  Location: [facility] ENDOSCOPY;  Service: Endoscopy;  Laterality: N/A;  CYSTOSCOPY W/ RETROGRADES Bilateral 02/23/2020  Procedure: CYSTOSCOPY WITH RETROGRADE PYELOGRAM;  Surgeon: Twylla Glendia BROCKS, MD;  Location: [facility] ORS;  Service: Urology;  Laterality: Bilateral;  CYSTOSCOPY WITH BIOPSY N/A 02/23/2020  Procedure: CYSTOSCOPY WITH BIOPSY;  Surgeon: Twylla Glendia BROCKS, MD;  Location: [facility] ORS;  Service: Urology;  Laterality: N/A;  EYE SURGERY    HERNIA REPAIR    x 2  INGUINAL HERNIA REPAIR Right 12/02/2015  Procedure: HERNIA REPAIR INGUINAL ADULT;  Surgeon: Larinda Unknown Sharps, MD;  Location: [facility] ORS;  Service: General;  Laterality: Right;  PROSTATE SURGERY  2012  Right Carotid Endarterectomy    TOTAL HIP ARTHROPLASTY Right 08/01/2021  Procedure: TOTAL HIP ARTHROPLASTY;  Surgeon: Edie Norleen PARAS, MD;  Location: [facility] ORS;  Service: Orthopedics;  Laterality: Right; Jordan Jarrett Clapp, MS, CCC-SLP Speech Language Pathologist Rehab Services; [facility] [phone] (ascom) Jordan J Clapp 07/02/2024, 9:38 AM  CT Angio Chest PE W and/or Wo Contrast Result Date: 06/30/2024 EXAM: CTA of the Chest with contrast for PE 06/30/2024 05:08:26 AM TECHNIQUE: CTA of the chest was performed without and with the administration of 65 mL of intravenous iohexol  (OMNIPAQUE ) 350 MG/ML injection. Multiplanar reformatted images are provided for review. MIP images are provided for review. Automated exposure control, iterative reconstruction,  and/or weight based adjustment of the mA/kV was utilized to reduce the radiation dose to as low as reasonably achievable. COMPARISON: 09/12/2024 CLINICAL HISTORY: eval PE, PNA. hypoxic failure. continued chest pain and tachycardia FINDINGS: PULMONARY ARTERIES: Pulmonary arteries are adequately opacified for evaluation. No pulmonary embolism. Main pulmonary artery is normal in caliber. MEDIASTINUM: Aortic atherosclerosis and multivessel coronary artery calcifications. Moderate-sized hiatal hernia. The heart and pericardium demonstrate no acute abnormality. LYMPH NODES: Subcarinal lymph node measures 1.6 cm,  image 74/4. LUNGS AND PLEURA: Multifocal bilateral airspace consolidation is identified, including both lower lobes, lingula, and right middle lobe. This is most severe within the left lower lobe. Emphysema and diffuse bronchial wall thickening. No pleural effusion or pneumothorax. Differential diagnosis for the airspace consolidation includes pneumonia, acute bronchitis, or exacerbation of chronic obstructive pulmonary disease (COPD). Clinical correlation and follow-up imaging to assess resolution are recommended. UPPER ABDOMEN: A midline ventral abdominal wall hernia is noted, which contains nonobstructed loops of transverse colon. Several Bosniak Class I cysts are noted arising off the upper pole of the right kidney measuring up to 1.6 cm. No follow-up imaging recommended. SOFT TISSUES AND BONES: Unchanged appearance of chronic severe compression deformity involving the T7 vertebra. Mild superior endplate deformity involving the T8 vertebra is also unchanged. IMPRESSION: 1. No evidence of pulmonary embolism. 2. Multifocal bilateral airspace consolidation, most severe in the left lower lobe, most consistent with multifocal pneumonia, with aspiration also in the differential diagnosis. Recommend follow-up chest imaging after treatment to document resolution. 3. Emphysema and diffuse bronchial wall thickening,  which may contribute to hypoxic respiratory failure. 4. Enlarged subcarinal lymph node measuring 1.6 cm, favored reactive in the setting of pneumonia. Recommend follow-up chest CT in approximately 3 months to confirm improvement or resolution. Electronically signed by: Waddell Calk MD MD 06/30/2024 05:27 AM EST RP Workstation: [id number]   DG Chest Portable 1 View Result Date: 06/30/2024 EXAM: 1 VIEW(S) XRAY OF THE CHEST 06/30/2024 03:03:00 AM COMPARISON: 10/16/2023 CLINICAL HISTORY: SOB, CHF/COPD exacerbation FINDINGS: LUNGS AND PLEURA: Interstitial changes are noted in the right base, new from the prior exam. Left retrocardiac density consistent with focal infiltrate is noted. Small left pleural effusion. Background emphysema. No pneumothorax. HEART AND MEDIASTINUM: Aortic atherosclerosis. No acute abnormality of the cardiac silhouette. BONES AND SOFT TISSUES: No acute osseous abnormality. IMPRESSION: 1. Left retrocardiac opacity suspicious for focal infiltrate. 2. Small left pleural effusion. 3. Interstitial changes in the right base, new from the prior exam. 4. Background emphysema. Electronically signed by: Oneil Devonshire MD MD 06/30/2024 03:10 AM EST RP Workstation: [id number]    Microbiology: Results for orders placed or performed during the hospital encounter of 06/30/24  Resp panel by RT-PCR (RSV, Flu A&B, Covid) Anterior Nasal Swab     Status: None   Collection Time: 06/30/24  2:47 AM   Specimen: Anterior Nasal Swab  Result Value Ref Range Status   SARS Coronavirus 2 by RT PCR NEGATIVE NEGATIVE Final    Comment: (NOTE) SARS-CoV-2 target nucleic acids are NOT DETECTED.  The SARS-CoV-2 RNA is generally detectable in upper respiratory specimens during the acute phase of infection. The lowest concentration of SARS-CoV-2 viral copies this assay can detect is 138 copies/mL. A negative result does not preclude SARS-Cov-2 infection and should not be used as the sole basis for treatment or other  patient management decisions. A negative result may occur with  improper specimen collection/handling, submission of specimen other than nasopharyngeal swab, presence of viral mutation(s) within the areas targeted by this assay, and inadequate number of viral copies(<138 copies/mL). A negative result must be combined with clinical observations, patient history, and epidemiological information. The expected result is Negative.  Fact Sheet for Patients:  bloggercourse.com  Fact Sheet for Healthcare Providers:  seriousbroker.it  This test is no t yet approved or cleared by the [place]  FDA and  has been authorized for detection and/or diagnosis of SARS-CoV-2 by FDA under an Emergency Use Authorization (EUA). This EUA will remain  in effect (  meaning this test can be used) for the duration of the COVID-19 declaration under Section 564(b)(1) of the Act, 21 U.S.C.section 360bbb-3(b)(1), unless the authorization is terminated  or revoked sooner.       Influenza A by PCR NEGATIVE NEGATIVE Final   Influenza B by PCR NEGATIVE NEGATIVE Final    Comment: (NOTE) The Xpert Xpress SARS-CoV-2/FLU/RSV plus assay is intended as an aid in the diagnosis of influenza from Nasopharyngeal swab specimens and should not be used as a sole basis for treatment. Nasal washings and aspirates are unacceptable for Xpert Xpress SARS-CoV-2/FLU/RSV testing.  Fact Sheet for Patients: bloggercourse.com  Fact Sheet for Healthcare Providers: seriousbroker.it  This test is not yet approved or cleared by the [place]  FDA and has been authorized for detection and/or diagnosis of SARS-CoV-2 by FDA under an Emergency Use Authorization (EUA). This EUA will remain in effect (meaning this test can be used) for the duration of the COVID-19 declaration under Section 564(b)(1) of the Act, 21 U.S.C. section  360bbb-3(b)(1), unless the authorization is terminated or revoked.     Resp Syncytial Virus by PCR NEGATIVE NEGATIVE Final    Comment: (NOTE) Fact Sheet for Patients: bloggercourse.com  Fact Sheet for Healthcare Providers: seriousbroker.it  This test is not yet approved or cleared by the [place]  FDA and has been authorized for detection and/or diagnosis of SARS-CoV-2 by FDA under an Emergency Use Authorization (EUA). This EUA will remain in effect (meaning this test can be used) for the duration of the COVID-19 declaration under Section 564(b)(1) of the Act, 21 U.S.C. section 360bbb-3(b)(1), unless the authorization is terminated or revoked.  Performed at [facility], [address] [place], [place] [address]   Blood culture (routine x 2)     Status: None   Collection Time: 06/30/24  3:43 AM   Specimen: BLOOD  Result Value Ref Range Status   Specimen Description BLOOD UNKNOWN  Final   Special Requests   Final    BOTTLES DRAWN AEROBIC AND ANAEROBIC Blood Culture results may not be optimal due to an inadequate volume of blood received in culture bottles   Culture   Final    NO GROWTH 5 DAYS Performed at [facility], [address] [place], [place] [address]    Report Status 07/05/2024 FINAL  Final  Blood culture (routine x 2)     Status: None   Collection Time: 06/30/24  3:48 AM   Specimen: BLOOD  Result Value Ref Range Status   Specimen Description BLOOD UNKNOWN  Final   Special Requests   Final    BOTTLES DRAWN AEROBIC AND ANAEROBIC Blood Culture results may not be optimal due to an inadequate volume of blood received in culture bottles   Culture   Final    NO GROWTH 5 DAYS Performed at [facility], [address], [place], [place] [address]    Report Status 07/05/2024 FINAL  Final  Expectorated Sputum Assessment w Gram Stain, Rflx to Resp Cult     Status: None   Collection Time:  06/30/24 12:08 PM   Specimen: Sputum  Result Value Ref Range Status   Specimen Description SPUTUM  Final   Special Requests NONE  Final   Sputum evaluation   Final    Sputum specimen not acceptable for testing.  Please recollect.   RESULT CALLED TO, READ BACK BY AND VERIFIED WITH: PINEDA [facility] AT 1302 ON 06/30/24 BY SS Performed at [facility], [address] [place],  [place] [address]    Report Status 06/30/2024 FINAL  Final    Labs: CBC: Recent Labs  Lab 06/30/24 0247 07/01/24 0429 07/03/24 0441 07/04/24 0409  WBC 13.2* 15.1* 12.4* 10.4  NEUTROABS 11.6*  --   --   --   HGB 13.6 11.3* 11.3* 11.9*  HCT 43.5 35.0* 35.2* 36.2*  MCV 99.8 96.7 96.2 94.3  PLT 188 160 180 188   Basic Metabolic Panel: Recent Labs  Lab 06/30/24 0247 06/30/24 0545 07/01/24 0429 07/02/24 0355 07/03/24 0441 07/04/24 0409  NA 138  --  137 137 137 139  K 5.1  --  4.6 4.5 4.5 4.2  CL 108  --  108 108 108 109  CO2 20*  --  19* 17* 19* 20*  GLUCOSE 138*  --  167* 171* 134* 137*  BUN 52*  --  40* 40* 41* 53*  CREATININE 1.70*  --  1.38* 1.35* 1.42* 1.70*  CALCIUM  9.5  --  8.7* 8.5* 8.7* 8.9  MG 2.1 1.9  --   --   --   --   PHOS  --  2.5  --   --   --   --    Liver Function Tests: Recent Labs  Lab 06/30/24 0247 07/02/24 0355  AST 22 16  ALT 16 12  ALKPHOS 87 54  BILITOT 0.5 0.3  PROT 7.0 5.7*  ALBUMIN 4.3 3.2*   CBG: No results for input(s): GLUCAP in the last 168 hours.  Discharge time spent: greater than 30 minutes.  Signed: AIDA CHO, MD [facility] 07/06/2024 "

## 2024-07-13 ENCOUNTER — Ambulatory Visit: Admitting: Neurosurgery

## 2024-07-30 ENCOUNTER — Inpatient Hospital Stay

## 2024-08-05 ENCOUNTER — Inpatient Hospital Stay

## 2024-08-05 ENCOUNTER — Inpatient Hospital Stay: Admitting: Oncology

## 2024-08-12 ENCOUNTER — Ambulatory Visit: Admitting: Neurosurgery

## 2025-03-12 ENCOUNTER — Other Ambulatory Visit: Admitting: Urology
